# Patient Record
Sex: Female | Born: 1940
Health system: Southern US, Community
[De-identification: ages and names within clinical notes are randomized; demographics above are authoritative.]

## PROBLEM LIST (undated history)

## (undated) DIAGNOSIS — E785 Hyperlipidemia, unspecified: Secondary | ICD-10-CM

## (undated) DIAGNOSIS — I10 Essential (primary) hypertension: Secondary | ICD-10-CM

## (undated) DIAGNOSIS — M479 Spondylosis, unspecified: Secondary | ICD-10-CM

## (undated) DIAGNOSIS — E119 Type 2 diabetes mellitus without complications: Secondary | ICD-10-CM

## (undated) DIAGNOSIS — I219 Acute myocardial infarction, unspecified: Secondary | ICD-10-CM

## (undated) DIAGNOSIS — F329 Major depressive disorder, single episode, unspecified: Secondary | ICD-10-CM

## (undated) DIAGNOSIS — I2510 Atherosclerotic heart disease of native coronary artery without angina pectoris: Secondary | ICD-10-CM

## (undated) DIAGNOSIS — E669 Obesity, unspecified: Secondary | ICD-10-CM

## (undated) DIAGNOSIS — F39 Unspecified mood [affective] disorder: Secondary | ICD-10-CM

## (undated) DIAGNOSIS — S060X9A Concussion with loss of consciousness of unspecified duration, initial encounter: Secondary | ICD-10-CM

## (undated) DIAGNOSIS — F32A Depression, unspecified: Secondary | ICD-10-CM

## (undated) DIAGNOSIS — S060XAA Concussion with loss of consciousness status unknown, initial encounter: Secondary | ICD-10-CM

## (undated) HISTORY — DX: Spondylosis, unspecified: M47.9

## (undated) HISTORY — DX: Unspecified mood (affective) disorder: F39

## (undated) HISTORY — PX: TONSILLECTOMY: SUR1361

## (undated) HISTORY — DX: Hyperlipidemia, unspecified: E78.5

## (undated) HISTORY — DX: Essential (primary) hypertension: I10

## (undated) HISTORY — PX: ABDOMINAL HYSTERECTOMY: SHX81

## (undated) HISTORY — PX: CHOLECYSTECTOMY: SHX55

## (undated) HISTORY — PX: DILATION AND CURETTAGE OF UTERUS: SHX78

## (undated) HISTORY — DX: Type 2 diabetes mellitus without complications: E11.9

## (undated) HISTORY — PX: CORONARY ANGIOPLASTY: SHX604

## (undated) HISTORY — PX: OTHER SURGICAL HISTORY: SHX169

---
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
## 1966-05-18 HISTORY — PX: GALLBLADDER SURGERY: SHX652

## 1966-05-18 HISTORY — PX: COMBINED HYSTERECTOMY ABDOMINAL W/ A&P REPAIR / OOPHORECTOMY: SUR292

## 2000-01-23 ENCOUNTER — Inpatient Hospital Stay (HOSPITAL_COMMUNITY): Admission: EM | Admit: 2000-01-23 | Discharge: 2000-01-26 | Payer: Self-pay | Admitting: Emergency Medicine

## 2000-01-23 ENCOUNTER — Encounter: Payer: Self-pay | Admitting: Emergency Medicine

## 2001-07-08 ENCOUNTER — Emergency Department (HOSPITAL_COMMUNITY): Admission: EM | Admit: 2001-07-08 | Discharge: 2001-07-08 | Payer: Self-pay | Admitting: Emergency Medicine

## 2001-07-08 ENCOUNTER — Encounter: Payer: Self-pay | Admitting: Emergency Medicine

## 2007-05-04 ENCOUNTER — Emergency Department (HOSPITAL_COMMUNITY): Admission: EM | Admit: 2007-05-04 | Discharge: 2007-05-04 | Payer: Self-pay | Admitting: Emergency Medicine

## 2007-12-20 ENCOUNTER — Emergency Department (HOSPITAL_COMMUNITY): Admission: EM | Admit: 2007-12-20 | Discharge: 2007-12-20 | Payer: Self-pay | Admitting: Emergency Medicine

## 2009-05-20 ENCOUNTER — Observation Stay (HOSPITAL_COMMUNITY): Admission: EM | Admit: 2009-05-20 | Discharge: 2009-05-22 | Payer: Self-pay | Admitting: Emergency Medicine

## 2009-06-25 ENCOUNTER — Ambulatory Visit: Payer: Self-pay | Admitting: Family Medicine

## 2009-07-31 ENCOUNTER — Ambulatory Visit: Payer: Self-pay | Admitting: Family Medicine

## 2009-08-05 ENCOUNTER — Ambulatory Visit: Payer: Self-pay | Admitting: Family Medicine

## 2009-09-17 ENCOUNTER — Ambulatory Visit: Payer: Self-pay | Admitting: Family Medicine

## 2009-10-15 ENCOUNTER — Ambulatory Visit: Payer: Self-pay | Admitting: Family Medicine

## 2009-11-13 ENCOUNTER — Ambulatory Visit: Payer: Self-pay | Admitting: Family Medicine

## 2009-11-29 ENCOUNTER — Ambulatory Visit: Payer: Self-pay | Admitting: Family Medicine

## 2009-12-16 ENCOUNTER — Ambulatory Visit: Payer: Self-pay | Admitting: Family Medicine

## 2009-12-30 ENCOUNTER — Encounter: Payer: Self-pay | Admitting: Internal Medicine

## 2009-12-30 ENCOUNTER — Ambulatory Visit: Payer: Self-pay | Admitting: Internal Medicine

## 2009-12-30 DIAGNOSIS — E1159 Type 2 diabetes mellitus with other circulatory complications: Secondary | ICD-10-CM | POA: Insufficient documentation

## 2009-12-30 DIAGNOSIS — I10 Essential (primary) hypertension: Secondary | ICD-10-CM | POA: Insufficient documentation

## 2009-12-30 DIAGNOSIS — F39 Unspecified mood [affective] disorder: Secondary | ICD-10-CM | POA: Insufficient documentation

## 2009-12-30 DIAGNOSIS — E1165 Type 2 diabetes mellitus with hyperglycemia: Secondary | ICD-10-CM | POA: Insufficient documentation

## 2009-12-30 DIAGNOSIS — E785 Hyperlipidemia, unspecified: Secondary | ICD-10-CM | POA: Insufficient documentation

## 2009-12-30 DIAGNOSIS — G8929 Other chronic pain: Secondary | ICD-10-CM | POA: Insufficient documentation

## 2009-12-30 DIAGNOSIS — M549 Dorsalgia, unspecified: Secondary | ICD-10-CM

## 2009-12-31 ENCOUNTER — Emergency Department (HOSPITAL_COMMUNITY): Admission: EM | Admit: 2009-12-31 | Discharge: 2009-12-31 | Payer: Self-pay | Admitting: Emergency Medicine

## 2010-01-22 ENCOUNTER — Emergency Department (HOSPITAL_COMMUNITY): Admission: EM | Admit: 2010-01-22 | Discharge: 2010-01-22 | Payer: Self-pay | Admitting: Emergency Medicine

## 2010-02-25 ENCOUNTER — Ambulatory Visit: Payer: Self-pay | Admitting: Internal Medicine

## 2010-02-25 DIAGNOSIS — M25569 Pain in unspecified knee: Secondary | ICD-10-CM | POA: Insufficient documentation

## 2010-02-25 LAB — CONVERTED CEMR LAB
BUN: 12 mg/dL (ref 6–23)
CO2: 26 meq/L (ref 19–32)
Calcium: 9.4 mg/dL (ref 8.4–10.5)
Chloride: 102 meq/L (ref 96–112)
Creatinine, Ser: 0.5 mg/dL (ref 0.4–1.2)
GFR calc non Af Amer: 119.76 mL/min (ref >60)
Glucose, Bld: 165 mg/dL — ABNORMAL HIGH (ref 70–99)
Hgb A1c MFr Bld: 10 % — ABNORMAL HIGH (ref 4.6–6.5)
Potassium: 4.1 meq/L (ref 3.5–5.1)
Sodium: 136 meq/L (ref 135–145)

## 2010-03-12 ENCOUNTER — Encounter: Payer: Self-pay | Admitting: Internal Medicine

## 2010-05-09 ENCOUNTER — Telehealth: Payer: Self-pay | Admitting: Internal Medicine

## 2010-05-09 ENCOUNTER — Encounter: Payer: Self-pay | Admitting: Internal Medicine

## 2010-05-20 ENCOUNTER — Ambulatory Visit
Admission: RE | Admit: 2010-05-20 | Discharge: 2010-05-20 | Payer: Self-pay | Source: Home / Self Care | Attending: Internal Medicine | Admitting: Internal Medicine

## 2010-05-20 LAB — HM DIABETES FOOT EXAM

## 2010-05-20 LAB — CONVERTED CEMR LAB: Blood Glucose, Fingerstick: 191

## 2010-05-28 ENCOUNTER — Observation Stay (HOSPITAL_COMMUNITY)
Admission: EM | Admit: 2010-05-28 | Discharge: 2010-05-29 | Payer: Self-pay | Source: Home / Self Care | Attending: Internal Medicine | Admitting: Internal Medicine

## 2010-06-02 LAB — DIFFERENTIAL
Basophils Absolute: 0 10*3/uL (ref 0.0–0.1)
Basophils Relative: 0 % (ref 0–1)
Eosinophils Absolute: 0 10*3/uL (ref 0.0–0.7)
Eosinophils Relative: 0 % (ref 0–5)
Lymphocytes Relative: 46 % (ref 12–46)
Lymphs Abs: 3 10*3/uL (ref 0.7–4.0)
Monocytes Absolute: 0.6 10*3/uL (ref 0.1–1.0)
Monocytes Relative: 9 % (ref 3–12)
Neutro Abs: 2.9 10*3/uL (ref 1.7–7.7)
Neutrophils Relative %: 45 % (ref 43–77)

## 2010-06-02 LAB — TROPONIN I: Troponin I: 0.01 ng/mL (ref 0.00–0.06)

## 2010-06-02 LAB — CBC
HCT: 39.6 % (ref 36.0–46.0)
Hemoglobin: 13.7 g/dL (ref 12.0–15.0)
MCH: 31.9 pg (ref 26.0–34.0)
MCHC: 34.6 g/dL (ref 30.0–36.0)
MCV: 92.1 fL (ref 78.0–100.0)
Platelets: 213 10*3/uL (ref 150–400)
RBC: 4.3 MIL/uL (ref 3.87–5.11)
RDW: 12.4 % (ref 11.5–15.5)
WBC: 6.5 10*3/uL (ref 4.0–10.5)

## 2010-06-02 LAB — POCT I-STAT 3, VENOUS BLOOD GAS (G3P V)
Acid-Base Excess: 1 mmol/L (ref 0.0–2.0)
Bicarbonate: 24.7 mEq/L — ABNORMAL HIGH (ref 20.0–24.0)
O2 Saturation: 89 %
TCO2: 26 mmol/L (ref 0–100)
pCO2, Ven: 37.5 mmHg — ABNORMAL LOW (ref 45.0–50.0)
pH, Ven: 7.426 — ABNORMAL HIGH (ref 7.250–7.300)
pO2, Ven: 56 mmHg — ABNORMAL HIGH (ref 30.0–45.0)

## 2010-06-02 LAB — CARDIAC PANEL(CRET KIN+CKTOT+MB+TROPI)
CK, MB: 1 ng/mL (ref 0.3–4.0)
CK, MB: 1.1 ng/mL (ref 0.3–4.0)
Relative Index: INVALID (ref 0.0–2.5)
Relative Index: INVALID (ref 0.0–2.5)
Total CK: 30 U/L (ref 7–177)
Total CK: 31 U/L (ref 7–177)
Troponin I: 0.01 ng/mL (ref 0.00–0.06)
Troponin I: 0.02 ng/mL (ref 0.00–0.06)

## 2010-06-02 LAB — GLUCOSE, CAPILLARY
Glucose-Capillary: 308 mg/dL — ABNORMAL HIGH (ref 70–99)
Glucose-Capillary: 118 mg/dL — ABNORMAL HIGH (ref 70–99)
Glucose-Capillary: 173 mg/dL — ABNORMAL HIGH (ref 70–99)
Glucose-Capillary: 190 mg/dL — ABNORMAL HIGH (ref 70–99)
Glucose-Capillary: 218 mg/dL — ABNORMAL HIGH (ref 70–99)
Glucose-Capillary: 152 mg/dL — ABNORMAL HIGH (ref 70–99)

## 2010-06-02 LAB — POCT CARDIAC MARKERS
CKMB, poc: 1.1 ng/mL (ref 1.0–8.0)
CKMB, poc: <1 ng/mL — ABNORMAL LOW (ref 1.0–8.0)
Myoglobin, poc: 28.5 ng/mL (ref 12–200)
Myoglobin, poc: 19.8 ng/mL (ref 12–200)
Troponin i, poc: <0.05 ng/mL (ref 0.00–0.09)
Troponin i, poc: <0.05 ng/mL (ref 0.00–0.09)

## 2010-06-02 LAB — TSH: TSH: 3.861 u[IU]/mL (ref 0.350–4.500)

## 2010-06-02 LAB — LIPID PANEL
Cholesterol: 203 mg/dL — ABNORMAL HIGH (ref 0–200)
HDL: 32 mg/dL — ABNORMAL LOW (ref >39)
LDL Cholesterol: UNDETERMINED mg/dL (ref 0–99)
Total CHOL/HDL Ratio: 6.3 RATIO
Triglycerides: 734 mg/dL — ABNORMAL HIGH (ref <150)
VLDL: UNDETERMINED mg/dL (ref 0–40)

## 2010-06-02 LAB — URINALYSIS, ROUTINE W REFLEX MICROSCOPIC
Bilirubin Urine: NEGATIVE
Hgb urine dipstick: NEGATIVE
Ketones, ur: NEGATIVE mg/dL
Nitrite: NEGATIVE
Protein, ur: NEGATIVE mg/dL
Specific Gravity, Urine: 1.016 (ref 1.005–1.030)
Urine Glucose, Fasting: 500 mg/dL — AB
Urobilinogen, UA: 1 mg/dL (ref 0.0–1.0)
pH: 5.5 (ref 5.0–8.0)

## 2010-06-02 LAB — BASIC METABOLIC PANEL
BUN: 5 mg/dL — ABNORMAL LOW (ref 6–23)
CO2: 23 mEq/L (ref 19–32)
Calcium: 8.7 mg/dL (ref 8.4–10.5)
Chloride: 102 mEq/L (ref 96–112)
Creatinine, Ser: 0.62 mg/dL (ref 0.4–1.2)
GFR calc Af Amer: >60 mL/min (ref >60)
GFR calc non Af Amer: >60 mL/min (ref >60)
Glucose, Bld: 290 mg/dL — ABNORMAL HIGH (ref 70–99)
Potassium: 3.5 mEq/L (ref 3.5–5.1)
Sodium: 135 mEq/L (ref 135–145)

## 2010-06-02 LAB — HEMOGLOBIN A1C
Hgb A1c MFr Bld: 8.6 % — ABNORMAL HIGH (ref <5.7)
Mean Plasma Glucose: 200 mg/dL — ABNORMAL HIGH (ref <117)

## 2010-06-02 LAB — CK TOTAL AND CKMB (NOT AT ARMC)
CK, MB: 0.9 ng/mL (ref 0.3–4.0)
Relative Index: INVALID (ref 0.0–2.5)
Total CK: 23 U/L (ref 7–177)

## 2010-06-02 LAB — URINE MICROSCOPIC-ADD ON

## 2010-06-04 ENCOUNTER — Ambulatory Visit: Admit: 2010-06-04 | Payer: Self-pay | Admitting: Internal Medicine

## 2010-06-08 ENCOUNTER — Encounter: Payer: Self-pay | Admitting: Family Medicine

## 2010-06-19 NOTE — Progress Notes (Signed)
Summary: ACTOS PA  Phone Note From Pharmacy   Caller: [facility] PA # [phone] OR [phone] Summary of Call: [employer]os req PA. Proceed or change med? No alternatives given.  Initial call taken by: Lamar Sprinkles, CMA,  May 09, 2010 11:32 AM  Follow-up for Phone Call        proceed with PA Follow-up by: Jacques Navy MD,  May 09, 2010 11:49 AM  Additional Follow-up for Phone Call Additional follow up Details #1::        I have started the prior auth waiting on form to be faxed Additional Follow-up by: Ami Bullins CMA,  May 09, 2010 3:18 PM    Additional Follow-up for Phone Call Additional follow up Details #2::    Form completed & faxed..............Marland KitchenLamar Sprinkles, CMA  May 09, 2010 3:32 PM   paperwork recieved, PA approved through 05/09/2012. Pharmacy advised via fax Follow-up by: Margaret Pyle, CMA,  May 15, 2010 8:53 AM

## 2010-06-19 NOTE — Letter (Signed)
Summary: Controlled Substances Contract  [facility]  [address] [place], [place] [address]   Phone: [phone]  Fax: [phone]    [facility] Controlled Substances Contract         Patient Name: Cynthia Singh Patient DOB: 05/15/1941        Patient MRN:  [id number]        Physician's Name: _________________________________________   Patients must complete this contract before doctors at the [facility] office will be willing to prescribe controlled substances. I understand that: ___1)  I am responsible for my controlled substance medications.  If my prescription is lost, misplaced or stolen, or if I take more than prescribed, my doctor will not write me a new prescription. ___2)  I will not request or accept controlled substances or controlled substance prescriptions from any other doctor or clinic while I am receiving controlled substance treatment at [facility].  The ONLY exception is if controlled substances are prescribed for the treatment of an acute condition that is NOT the diagnosis for which I am receiving treatment at [facility].   I will call my physician at [facility] if I receive controlled substance or controlled substance prescriptions from anywhere else. ___3)  Controlled substance refills will be made ONLY during regular office hours. ___4)  Refills will not be made if I run out early.  Refills will not be made during work-in or urgent care visits.  Refills will NOT be made for "emergencies", such as on a Friday afternoon or by on call service at night or weekends.  I understand that I am required to call at least 2 business days prior to expiration date for controlled substance and/or needing controlled substance refills.   ___5)  I will not use illicit (illegal) drugs.  ___6)  I agree to take urine or blood drug tests when requested for routine screening. ___7)  I agree to use only ONE pharmacy for  filling ALL my controlled substance prescriptions.             Name and Location of Pharmacy:                                                                                                                  ___8)  I understand that my doctor may review my use of controlled substances using the [facility] Controlled Substance Reporting System. ___9)  If I behave in an abusive way towards [facility] staff, my controlled substance prescriptions may be stopped, and I may be dismissed from this practice. ___10)  I understand that if I break any of the above terms of this contract, my pain prescription and/or treatment may be stopped immediately.  If I get controlled substances from someone else or use illegal drugs, I may be reported to all my doctors, medical facilities and appropriate authorities.  I have been fully informed by [facility] physicians and the staff regarding psychological dependence (addiction) to controlled substances.  I understand that I should stop my medication ONLY under medical supervision or I may have withdrawal symptoms.  ***I have read this contract and it has been explained to me by [facility] physicians and/or their staff, and I fully understand the consequences of violating any of the terms of this contract.    Patient Signature _________________________________________ Date December 30, 2009   [facility] Staff Signature ____________________________________ Date December 30, 2009

## 2010-06-19 NOTE — Medication Information (Signed)
Summary: Prior [employer]ho for [employer]os/[employer]  Prior [employer]ho for [employer]   Imported By: Sherian Rein 05/16/2010 09:45:01  _____________________________________________________________________  External Attachment:    Type:   Image     Comment:   External Document

## 2010-06-19 NOTE — Assessment & Plan Note (Signed)
Summary: New / [employer] -- ok men/cd   Vital Signs:  Patient profile:   70 year old female Height:      68 inches Weight:      228 pounds BMI:     34.79 O2 Sat:      96 % on Room air Temp:     98.2 degrees F oral Pulse rate:   85 / minute BP sitting:   176 / 88  (left arm) Cuff size:   regular  Vitals Entered By: Bill Salinas CMA (December 30, 2009 3:54 PM)  O2 Flow:  Room air  Vision Screening:      Vision Comments: Pt's last eye exam was Aug 2010. 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 She was formerly followed by Dr. Laural Benes who is restricting his practice to diabetology.   Patient's chief complaint is back pain which has been a problem for years. This has been diagnosed DJD, no DDD. She has never had surgery on her back. She has had imaging studies of the back 17-18 years ago in [place]. She does have some pain that radiates to the medial left leg. She also has pain in the neck at the superior cervical spine.  She has been told that she had observed apneic episodes but she has not had a sleep study   She has a history of severe HA with hospitalization in January '11: consult with Dr. Sharene Skeans; CT brain - normal, MRI brain normal; MRA with narrow right vertebral artery, non-visoualization of right PICA.   She has diabetes and has been followed by Dr. Laural Benes - will need to obtain lab work.   She has left knee pain for the past 6 weeks, with swelling but no heat or erythema. Hurts with movement.   Current Medications (verified): 1)  Benicar 40 Mg Tabs (Olmesartan Medoxomil) .Marland Kitchen.. 1 Tab Once Daily 2)  Amitriptyline Hcl 150 Mg Tabs (Amitriptyline Hcl) .Marland Kitchen.. 1 Tab At Bedtime 3)  Lortab 7.5-500 Mg Tabs (Hydrocodone-Acetaminophen) .Marland Kitchen.. 1 To 2 Tabs Once Daily 4)  Actos 45 Mg Tabs (Pioglitazone Hcl) .Marland Kitchen.. 1 Tab Once Daily 5)  Vitamin D  1000 Unit Tabs (Cholecalciferol) .Marland Kitchen.. 1 Tab Once Daily 6)  Garlic Oil 500 Mg Caps (Garlic) .Marland Kitchen.. 1 Cap Once Daily  Allergies (verified): No Known Drug Allergies  Past History:  Past Medical History: UCD OSTEOARTHRITIS, BACK (ICD-715.98) HYPERLIPIDEMIA (ICD-272.4) HYPERTENSION (ICD-401.9) DIABETES-TYPE 2 (ICD-250.00) DEPRESSIVE DISORDER (ICD-311)  Past Surgical History: Gallbladder surg 1968 Appendectomy 1959 hysterectomy/oopherectomy- 1968 multiple D&Cs Tonsillectomy at 7   Family History: father - biologic father unknown mother - deceased: MVA in her late 52's Neg- breast, CAD/MI brother with colon cancer  Social History: HSG married - '62 2 sons - '63, '66; 1 daughter - '69; 11 grandchildren; 4 great-grandchildren work - Warehouse manager work, Conservation officer, 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   Review of Systems       The patient complains of dyspnea on exertion and headaches.  The patient denies anorexia, fever, weight loss, weight gain, vision loss, decreased hearing, chest pain, syncope, peripheral edema, hemoptysis, abdominal pain, severe indigestion/heartburn, incontinence, suspicious skin lesions, difficulty walking, depression, abnormal bleeding, and breast masses.         headaches infrequently  Physical Exam  General:  Heavy-set overweight white female in no distress Head:  normocephalic and atraumatic.   Eyes:  vision  grossly intact, pupils equal, and pupils round.  C&S clear  Ears:  External ear exam shows no significant lesions or deformities.  Otoscopic examination reveals clear canals, tympanic membranes are intact bilaterally without bulging, retraction, inflammation or discharge. Hearing is grossly normal bilaterally. Mouth:  missing several teeth. No buccal or palatal lesions. Posterior pharynx clear Neck:  supple, no thyromegaly, and no carotid bruits.   Chest Wall:  no scoliosis or kyphosis. Tender to palpation bilateral mid-scapular line from t1-L1 level. No CVAT or tenderness over  the spinous process Lungs:  normal respiratory effort, normal breath sounds, no crackles, and no wheezes.   Heart:  normal rate, regular rhythm, and no murmur.   Abdomen:  soft, non-tender, and normal bowel sounds.   Msk:  Large and small joints UE normal. Knees without erythema, effusion Pulses:  2+ radial Neurologic:  alert & oriented X3, cranial nerves II-XII intact, strength normal in all extremities, gait normal, and DTRs symmetrical and normal.   Skin:  turgor normal and color normal.   Cervical Nodes:  no anterior cervical adenopathy and no posterior cervical adenopathy.   Psych:  Oriented X3, memory intact for recent and remote, normally interactive, and good eye contact.     Impression & Recommendations:  Problem # 1:  OSTEOARTHRITIS, BACK (ICD-715.98) Hisotry of chronic back pain but no imaging data or consult data available. She does seem to move well.  Plan - L-S and thoracic spine films           Renewed narcotic prescription for pain control - patient reviewed and signed pain mgt contract  Her updated medication list for this problem includes:    Lortab 7.5-500 Mg Tabs (Hydrocodone-acetaminophen) .Marland Kitchen... 1 to 2 tabs once daily  Orders: T-Lumbar Spine Complete, 5 Views ([id number]) T-Thoracic Spine 2 Views ([id number])  Addendum - x-ray reveals DDD, DJD thoracic spine; demineralization and mild DDD lumbar spine.  Problem # 2:  HYPERLIPIDEMIA (ICD-272.4) By report patient with elevated lipids.   Plan - obtain recent labs from previous MD with medication recommendations to follow.  Problem # 3:  HYPERTENSION (ICD-401.9)  Her updated medication list for this problem includes:    Benicar 40 Mg Tabs (Olmesartan medoxomil) .Marland Kitchen... 1 tab once daily  BP today: 176/88  Subopitmal control at today's visit  Plan - pt to bring in BP readings from outside the office           will adjust medications at next visit based on readings.  Problem # 4:  DIABETES-TYPE 2 (ICD-250.00) No  lab available. Patient to obtain lab from previous MD with recommendations as to testing or medication adjustment to follow.  Her updated medication list for this problem includes:    Benicar 40 Mg Tabs (Olmesartan medoxomil) .Marland Kitchen... 1 tab once daily    Actos 45 Mg Tabs (Pioglitazone hcl) .Marland Kitchen... 1 tab once daily    Metformin Hcl 500 Mg Xr24h-tab (Metformin hcl) .Marland Kitchen... 1 by mouth once daily  Problem # 5:  DEPRESSIVE DISORDER (ICD-311) Seems to be stable on present medication  Her updated medication list for this problem includes:    Amitriptyline Hcl 150 Mg Tabs (Amitriptyline hcl) .Marland Kitchen... 1 tab at bedtime  Problem # 6:  Preventive Health Care (ICD-V70.0) She is to return with outside labs for complete physical, lab review and health maintenance.  Complete Medication List: 1)  Benicar 40 Mg Tabs (Olmesartan medoxomil) .Marland Kitchen.. 1 tab once daily 2)  Amitriptyline Hcl 150 Mg Tabs (Amitriptyline hcl) .Marland Kitchen.. 1 tab  at bedtime 3)  Lortab 7.5-500 Mg Tabs (Hydrocodone-acetaminophen) .Marland Kitchen.. 1 to 2 tabs once daily 4)  Actos 45 Mg Tabs (Pioglitazone hcl) .Marland Kitchen.. 1 tab once daily 5)  Vitamin D 1000 Unit Tabs (Cholecalciferol) .Marland Kitchen.. 1 tab once daily 6)  Garlic Oil 500 Mg Caps (Garlic) .Marland Kitchen.. 1 cap once daily 7)  Metformin Hcl 500 Mg Xr24h-tab (Metformin hcl) .Marland Kitchen.. 1 by mouth once daily  G THORACIC SPINE - [id number]   Clinical Data: Osteoarthritis, back pain   THORACIC SPINE - 2 VIEW   Comparison: None   Findings: 12 pairs of ribs. Bony demineralization. Multilevel endplate spur formation thoracic spine. Broad-based dextroconvex thoracic scoliosis, apex T7. Mildly tortuous thoracic aorta. Vertebral body heights maintained without fracture or subluxation.   IMPRESSION: Multilevel degenerative disc disease changes thoracic spine with dextroconvex scoliosis. Bony demineralization.   Read By:  Lollie Marrow,  M.D.  [facility] Overall SPINE COMPLETE - [id number]   Clinical Data: Osteoarthritis, back pain   LUMBAR SPINE  - COMPLETE 4+ VIEW   Comparison: None   Findings: Five non-rib bearing lumbar type vertebrae. Diffuse bony demineralization. Facet degenerative changes lower lumbar spine. Scattered disc space narrowing lumbar spine. Vertebral body heights maintained without fracture or subluxation. No spondylolysis. Minimal atherosclerotic calcification aorta.   IMPRESSION: Bony demineralization. Mild degenerative facet and disc disease changes lumbar spine as above.   Read By:  Lollie Marrow,  Judie Petit.D.Prescriptions: LORTAB 7.5-500 MG TABS (HYDROCODONE-ACETAMINOPHEN) 1 to 2 tabs once daily  #60 x 3   Entered and Authorized by:   Jacques Navy MD   Signed by:   Jacques Navy MD on 12/30/2009   Method used:   Print then Give to Patient   RxID:   [id number] ACTOS 45 MG TABS (PIOGLITAZONE HCL) 1 tab once daily  #30 x 12   Entered and Authorized by:   Jacques Navy MD   Signed by:   Jacques Navy MD on 12/30/2009   Method used:   Electronically to        [facility]* (retail)       [address]       [place], [place]  [address]       Ph: [phone]       Fax: [phone]   RxID:   [id number]   Prevention & Chronic Care Immunizations   Influenza vaccine: Historical  (03/11/2009)    Tetanus booster: 07/16/2008: Historical    Pneumococcal vaccine: Not documented    H. zoster vaccine: Not documented  Colorectal Screening   Hemoccult: Not documented    Colonoscopy: Not documented  Other Screening   Pap smear: Not documented    Mammogram: Not documented    DXA bone density scan: Not documented   Smoking status: Not documented  Diabetes Mellitus   HgbA1C: Not documented    Eye exam: Not documented    Foot exam: Not documented   High risk foot: Not documented   Foot care education: Not documented    Urine microalbumin/creatinine ratio: Not documented  Lipids   Total Cholesterol: Not documented   LDL: Not documented   LDL Direct: Not documented    HDL: Not documented   Triglycerides: Not documented    SGOT (AST): Not documented   SGPT (ALT): Not documented   Alkaline phosphatase: Not documented   Total bilirubin: Not documented  Hypertension   Last Blood Pressure: 176 / 88  (12/30/2009)   Serum creatinine: Not documented   Serum potassium Not documented  Self-Management Support :    Diabetes self-management support: Not documented    Hypertension self-management support: Not documented    Lipid self-management support: Not documented     Immunization History:  Tetanus/Td Immunization History:    Tetanus/Td:  historical (07/16/2008)  Influenza Immunization History:    Influenza:  historical (03/11/2009)

## 2010-06-19 NOTE — Miscellaneous (Signed)
Summary: Controlled Substances Contract/[facility]  Controlled Substances Contract/[facility]   Imported By: Sherian Rein 01/01/2010 11:03:03  _____________________________________________________________________  External Attachment:    Type:   Image     Comment:   External Document

## 2010-06-19 NOTE — Letter (Signed)
Summary: Controlled Substances Contract  [facility]  [address]   [place], [place] [address]   Phone: [phone]  Fax: [phone]    [facility] Controlled Substances Contract         Patient Name: Cynthia Singh Patient DOB: 10/26/1940        Patient MRN:  [id number]        Physician's Name: _________________________________________   Patients must complete this contract before doctors at the [facility] office will be willing to prescribe controlled substances. I understand that: ___1)  I am responsible for my controlled substance medications.  If my prescription is lost, misplaced or stolen, or if I take more than prescribed, my doctor will not write me a new prescription. ___2)  I will not request or accept controlled substances or controlled substance prescriptions from any other doctor or clinic while I am receiving controlled substance treatment at [facility].  The ONLY exception is if controlled substances are prescribed for the treatment of an acute condition that is NOT the diagnosis for which I am receiving treatment at [facility].   I will call my physician at [facility] if I receive controlled substance or controlled substance prescriptions from anywhere else. ___3)  Controlled substance refills will be made ONLY during regular office hours. ___4)  Refills will not be made if I run out early.  Refills will not be made during work-in or urgent care visits.  Refills will NOT be made for "emergencies", such as on a Friday afternoon or by on call service at night or weekends.  I understand that I am required to call at least 2 business days prior to expiration date for controlled substance and/or needing controlled substance refills.   ___5)  I will not use illicit (illegal) drugs.  ___6)  I agree to take urine or blood drug tests when requested for routine screening. ___7)  I agree to use only ONE pharmacy for  filling ALL my controlled substance prescriptions.             Name and Location of Pharmacy:                                                                                                                  ___8)  I understand that my doctor may review my use of controlled substances using the [place] Controlled Substance Reporting System. ___9)  If I behave in an abusive way towards [facility] staff, my controlled substance prescriptions may be stopped, and I may be dismissed from this practice. ___10)  I understand that if I break any of the above terms of this contract, my pain prescription and/or treatment may be stopped immediately.  If I get controlled substances from someone else or use illegal drugs, I may be reported to all my doctors, medical facilities and appropriate authorities.  I have been fully informed by [facility] physicians and the staff regarding psychological dependence (addiction) to controlled substances.    I understand that I should stop my medication ONLY under medical supervision or I may have withdrawal symptoms.  ***I have read this contract and it has been explained to me by [facility] physicians and/or their staff, and I fully understand the consequences of violating any of the terms of this contract.    Patient Signature _________________________________________ Date December 30, 2009    Staff Signature ____________________________________ Date December 30, 2009 

## 2010-06-19 NOTE — Assessment & Plan Note (Signed)
Summary: diabetic f/u per flag/cd   Vital Signs:  Patient profile:   70 year old female Height:      68 inches Weight:      225 pounds BMI:     34.33 O2 Sat:      97 % on Room air Temp:     98.0 degrees F oral Pulse rate:   83 / minute BP sitting:   162 / 92  (left arm) Cuff size:   regular  Vitals Entered By: Ami Bullins CMA (May 20, 2010 2:52 PM)  O2 Flow:  Room air CC: follow-up visit/ ab CBG Result 191   Primary Care Provider:  Jacques Navy MD  CC:  follow-up visit/ ab.  History of Present Illness: Patient presents for follow-up of diabetes. She had an elevated A1C in October '11 and was notified by of this and had actos added to her regimen of metformin XR 500mg  once a day. She reports that CBGs continue to run high - 150+.She has had some blurry vision but no other complaints.  Current Medications (verified): 1)  Benicar 40 Mg Tabs (Olmesartan Medoxomil) .Marland Kitchen.. 1 Tab Once Daily 2)  Amitriptyline Hcl 150 Mg Tabs (Amitriptyline Hcl) .Marland Kitchen.. 1 Tab At Bedtime 3)  Lortab 7.5-500 Mg Tabs (Hydrocodone-Acetaminophen) .Marland Kitchen.. 1 To 2 Tabs Once Daily 4)  Actos 45 Mg Tabs (Pioglitazone Hcl) .Marland Kitchen.. 1 Tab Once Daily 5)  Vitamin D 1000 Unit Tabs (Cholecalciferol) .Marland Kitchen.. 1 Tab Once Daily 6)  Garlic Oil 500 Mg Caps (Garlic) .Marland Kitchen.. 1 Cap Once Daily 7)  Metformin Hcl 500 Mg Xr24h-Tab (Metformin Hcl) .Marland Kitchen.. 1 By Mouth Once Daily  Allergies (verified): No Known Drug Allergies  Past History:  Past Medical History: Last updated: 12/30/2009 UCD OSTEOARTHRITIS, BACK (ICD-715.98) HYPERLIPIDEMIA (ICD-272.4) HYPERTENSION (ICD-401.9) DIABETES-TYPE 2 (ICD-250.00) DEPRESSIVE DISORDER (ICD-311)  Past Surgical History: Last updated: 12/30/2009 Gallbladder surg 1968 Appendectomy 1959 hysterectomy/oopherectomy- 1968 multiple D&Cs Tonsillectomy at 7  FH reviewed for relevance, SH/Risk Factors reviewed for relevance  Review of Systems       The patient complains of peripheral edema.  The  patient denies anorexia, fever, weight loss, weight gain, vision loss, hoarseness, chest pain, dyspnea on exertion, abdominal pain, severe indigestion/heartburn, muscle weakness, difficulty walking, and depression.         mild peripheral edema  Physical Exam  General:  Overweight white woman in no distress Head:  Normocephalic and atraumatic without obvious abnormalities. No apparent alopecia or balding. Eyes:  vision grossly intact, pupils equal, and pupils round.   Neck:  supple.   Lungs:  normal respiratory effort and normal breath sounds.   Heart:  normal rate and regular rhythm.   Abdomen:  obese Msk:  no joint tenderness, no joint swelling, no joint warmth, and no redness over joints.   Pulses:  2+ radial Neurologic:  alert & oriented X3 and cranial nerves II-XII intact.  Decrease DTR right patellar tendon compared to left. Skin:  turgor normal and color normal.   Psych:  Oriented X3, normally interactive, and good eye contact.    Diabetes Management Exam:    Foot Exam (with socks and/or shoes not present):       Sensory-Pinprick/Light touch:          Left medial foot (L-4): normal          Left dorsal foot (L-5): normal          Left lateral foot (S-1): normal  Right medial foot (L-4): diminished          Right dorsal foot (L-5): normal          Right lateral foot (S-1): diminished       Sensory-other: decreased deep vibratory sensation at right foot; preserved sensation to deep vibration left   Impression & Recommendations:  Problem # 1:  DIABETES-TYPE 2 (ICD-250.00) Suboptimal control  Plan - continue actos 45mg            increase metformin to 1000mg  two times a day            A1C in 3 months.            To schedule appointment with eye doctor  Her updated medication list for this problem includes:    Benicar 40 Mg Tabs (Olmesartan medoxomil) .Marland Kitchen... 1 tab once daily    Actos 45 Mg Tabs (Pioglitazone hcl) .Marland Kitchen... 1 tab once daily    Metformin Hcl 1000 Mg Tabs  (Metformin hcl) .Marland Kitchen... 1 by mouth two times a day  Problem # 2:  PARESTHESIA (ICD-782.0) Patient with decreased sensation and reflex Right LE and foot. The pattern of deficit suggests focal lesion spine as opposed to diabetic neuropathy. She has had back trouble and a remote injury.  Plan - patient made aware of this deficit and instructed to be vigilent - checking that foot freuently for any injury.   Complete Medication List: 1)  Benicar 40 Mg Tabs (Olmesartan medoxomil) .Marland Kitchen.. 1 tab once daily 2)  Amitriptyline Hcl 150 Mg Tabs (Amitriptyline hcl) .Marland Kitchen.. 1 tab at bedtime 3)  Lortab 7.5-500 Mg Tabs (Hydrocodone-acetaminophen) .Marland Kitchen.. 1 to 2 tabs once daily 4)  Actos 45 Mg Tabs (Pioglitazone hcl) .Marland Kitchen.. 1 tab once daily 5)  Vitamin D 1000 Unit Tabs (Cholecalciferol) .Marland Kitchen.. 1 tab once daily 6)  Garlic Oil 500 Mg Caps (Garlic) .Marland Kitchen.. 1 cap once daily 7)  Metformin Hcl 1000 Mg Tabs (Metformin hcl) .Marland Kitchen.. 1 by mouth two times a day Prescriptions: LORTAB 7.5-500 MG TABS (HYDROCODONE-ACETAMINOPHEN) 1 to 2 tabs once daily  #60 x 3   Entered and Authorized by:   Jacques Navy MD   Signed by:   Jacques Navy MD on 05/21/2010   Method used:   Handwritten   RxID:   [id number] METFORMIN HCL 1000 MG TABS (METFORMIN HCL) 1 by mouth two times a day  #60 x 12   Entered and Authorized by:   Jacques Navy MD   Signed by:   Jacques Navy MD on 05/20/2010   Method used:   Electronically to        [facility]* (retail)       [address]       [place], [place]  [address]       Ph: [phone]       Fax: [phone]   RxID:   [id number] AMITRIPTYLINE HCL 150 MG TABS (AMITRIPTYLINE HCL) 1 tab at bedtime  #90 x 3   Entered and Authorized by:   Jacques Navy MD   Signed by:   Jacques Navy MD on 05/20/2010   Method used:   Electronically to        [facility]* (retail)       [address]       [place], [place]  [address]       Ph: [phone]       Fax: [phone]   RxID:    [id number]    Orders  Added: 1)  Est. Patient Level III [04540]

## 2010-06-19 NOTE — Assessment & Plan Note (Signed)
Summary: POST HOSP/NWS  #   Vital Signs:  Patient profile:   70 year old female Height:      68 inches (172.72 cm) Weight:      215 pounds (97.73 kg) BMI:     32.81 O2 Sat:      93 % on Room air Temp:     98.3 degrees F (36.83 degrees C) oral Pulse rate:   76 / minute BP sitting:   124 / 80  (left arm) Cuff size:   regular  Vitals Entered By: Brenton Grills MA (February 25, 2010 11:43 AM)  O2 Flow:  Room air CC: [facility] F/U/aj Is Patient Diabetic? Yes   Primary Care Provider:  Jacques Navy MD  CC:  [facility] F/U/aj.  History of Present Illness: Paitnet presents for follow-up of left knee pain. she was seen in the ED Sept 7th - all notes and xrays reviewed: Had degereative change only. Was treated with percocet. she says the pain is getting worse. She gets intermittent swelling. the pain is radiating to the lower leg. She takes lortab. She reports that NSAIDs do not help. She has difficulty with standing and walking due to leg pain.   she reports that her blood sugar is running high. She is on a low sugar low carb diet.   Current Medications (verified): 1)  Benicar 40 Mg Tabs (Olmesartan Medoxomil) .Marland Kitchen.. 1 Tab Once Daily 2)  Amitriptyline Hcl 150 Mg Tabs (Amitriptyline Hcl) .Marland Kitchen.. 1 Tab At Bedtime 3)  Lortab 7.5-500 Mg Tabs (Hydrocodone-Acetaminophen) .Marland Kitchen.. 1 To 2 Tabs Once Daily 4)  Actos 45 Mg Tabs (Pioglitazone Hcl) .Marland Kitchen.. 1 Tab Once Daily 5)  Vitamin D 1000 Unit Tabs (Cholecalciferol) .Marland Kitchen.. 1 Tab Once Daily 6)  Garlic Oil 500 Mg Caps (Garlic) .Marland Kitchen.. 1 Cap Once Daily 7)  Metformin Hcl 500 Mg Xr24h-Tab (Metformin Hcl) .Marland Kitchen.. 1 By Mouth Once Daily  Allergies (verified): No Known Drug Allergies  Past History:  Past Medical History: Last updated: 12/30/2009 UCD OSTEOARTHRITIS, BACK (ICD-715.98) HYPERLIPIDEMIA (ICD-272.4) HYPERTENSION (ICD-401.9) DIABETES-TYPE 2 (ICD-250.00) DEPRESSIVE DISORDER (ICD-311)  Past Surgical History: Last updated:  12/30/2009 Gallbladder surg 1968 Appendectomy 1959 hysterectomy/oopherectomy- 1968 multiple D&Cs Tonsillectomy at 7   Family History: Last updated: 12/30/2009 father - biologic father unknown mother - deceased: MVA in her late 83's Neg- breast, CAD/MI brother with colon cancer  Social History: Last updated: 12/30/2009 HSG married - '62 2 sons - '63, '66; 1 daughter - '69; 11 grandchildren; 4 great-grandchildren work - Warehouse manager work, Conservation officer, 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   Review of Systems  The patient denies anorexia, fever, weight loss, weight gain, decreased hearing, chest pain, syncope, dyspnea on exertion, prolonged cough, headaches, abdominal pain, severe indigestion/heartburn, incontinence, suspicious skin lesions, difficulty walking, and angioedema.    Physical Exam  General:  alert, well-developed, well-nourished, and normal appearance.   Head:  normocephalic and atraumatic.   Eyes:  C&S clear Neck:  supple and full ROM.   Lungs:  normal respiratory effort, normal breath sounds, and no wheezes.   Heart:  normal rate, regular rhythm, and no gallop.   Abdomen:  soft, non-tender, normal bowel sounds, and no rigidity.   Msk:  crepitis with movement of the right knee. Left knee wihtout effusion, no click, full range of motion. Tender to palpation at the medial aspect of the knee.  Neurologic:  alert & oriented X3 and cranial nerves II-XII intact.     Impression & Recommendations:  Problem # 1:  DIABETES-TYPE 2 (ICD-250.00)  Due ofr A1C with recommendations to follow  Her updated medication list for this problem includes:    Benicar 40 Mg Tabs (Olmesartan medoxomil) .Marland Kitchen... 1 tab once daily    Actos 45 Mg Tabs (Pioglitazone hcl) .Marland Kitchen... 1 tab once daily    Metformin Hcl 500 Mg Xr24h-tab (Metformin hcl) .Marland Kitchen... 1 by mouth once daily  Orders: TLB-BMP (Basic Metabolic Panel-BMET) (80048-METABOL) TLB-A1C / Hgb A1C (Glycohemoglobin) (83036-A1C)  Problem # 2:  KNEE PAIN, LEFT, CHRONIC  (ICD-719.46)  Patient with progressive pain in the left knee. [facility] with tricompartmental DJD.  Plan - refer to Dr. Kristeen Miss.  Her updated medication list for this problem includes:    Lortab 7.5-500 Mg Tabs (Hydrocodone-acetaminophen) .Marland Kitchen... 1 to 2 tabs once daily  Orders: Orthopedic Surgeon Referral (Ortho Surgeon)  Problem # 3:  HYPERTENSION (ICD-401.9)  Her updated medication list for this problem includes:    Benicar 40 Mg Tabs (Olmesartan medoxomil) .Marland Kitchen... 1 tab once daily  BP today: 124/80 Prior BP: 176/88 (12/30/2009)  Good control on present medications  Complete Medication List: 1)  Benicar 40 Mg Tabs (Olmesartan medoxomil) .Marland Kitchen.. 1 tab once daily 2)  Amitriptyline Hcl 150 Mg Tabs (Amitriptyline hcl) .Marland Kitchen.. 1 tab at bedtime 3)  Lortab 7.5-500 Mg Tabs (Hydrocodone-acetaminophen) .Marland Kitchen.. 1 to 2 tabs once daily 4)  Actos 45 Mg Tabs (Pioglitazone hcl) .Marland Kitchen.. 1 tab once daily 5)  Vitamin D 1000 Unit Tabs (Cholecalciferol) .Marland Kitchen.. 1 tab once daily 6)  Garlic Oil 500 Mg Caps (Garlic) .Marland Kitchen.. 1 cap once daily 7)  Metformin Hcl 500 Mg Xr24h-tab (Metformin hcl) .Marland Kitchen.. 1 by mouth once daily Prescriptions: LORTAB 7.5-500 MG TABS (HYDROCODONE-ACETAMINOPHEN) 1 to 2 tabs once daily  #60 x 3   Entered and Authorized by:   Jacques Navy MD   Signed by:   Jacques Navy MD on 02/25/2010   Method used:   Print then Give to Patient   RxID:   [id number]

## 2010-06-19 NOTE — Medication Information (Signed)
Summary: Actos approved/[employer]  Actos approved/[employer]   Imported By: Sherian Rein 05/21/2010 09:49:33  _____________________________________________________________________  External Attachment:    Type:   Image     Comment:   External Document

## 2010-06-19 NOTE — Letter (Signed)
   [facility] [address] [place], [place]  [address] Phone: [phone]      March 13, 2010   Margie Brashier [address] [place], [place] [address]  RE:  LAB RESULTS  Dear  Ms. Isais,  The following is an interpretation of your most recent lab tests.  Please take note of any instructions provided or changes to medications that have resulted from your lab work.  ELECTROLYTES:  Good - no changes needed  KIDNEY FUNCTION TESTS:  Good - no changes needed  DIABETIC STUDIES:  Poor - schedule a follow-up appointment soon Blood Glucose: 165   HgbA1C: 10.0     I apologize for the delay. Your A1C indicates very poor control of diabetes. Please schedule an appointment.   Sincerely Yours,    Jacques Navy MD

## 2010-07-10 ENCOUNTER — Other Ambulatory Visit: Payer: Self-pay | Admitting: Internal Medicine

## 2010-07-10 ENCOUNTER — Ambulatory Visit (INDEPENDENT_AMBULATORY_CARE_PROVIDER_SITE_OTHER)
Admission: RE | Admit: 2010-07-10 | Discharge: 2010-07-10 | Disposition: A | Discharge status: Home / Self Care | Payer: Medicare HMO | Source: Ambulatory Visit | Attending: Internal Medicine | Admitting: Internal Medicine

## 2010-07-10 ENCOUNTER — Ambulatory Visit (INDEPENDENT_AMBULATORY_CARE_PROVIDER_SITE_OTHER): Payer: Medicare HMO | Admitting: Internal Medicine

## 2010-07-10 ENCOUNTER — Encounter: Payer: Self-pay | Admitting: Internal Medicine

## 2010-07-10 DIAGNOSIS — M542 Cervicalgia: Secondary | ICD-10-CM

## 2010-07-10 DIAGNOSIS — I10 Essential (primary) hypertension: Secondary | ICD-10-CM

## 2010-07-10 DIAGNOSIS — E119 Type 2 diabetes mellitus without complications: Secondary | ICD-10-CM

## 2010-07-13 ENCOUNTER — Telehealth: Payer: Self-pay | Admitting: Internal Medicine

## 2010-07-24 NOTE — Progress Notes (Signed)
  Phone Note Outgoing Call   Reason for Call: Discuss lab or test results Summary of Call: Plese call patient - due to high blood pressure will call in furosemide 20mg  by mouth once daily. Return for BP check in 3 weeks. Return for A1C in 3 months.   Thanks Initial call taken by: Jacques Navy MD,  July 13, 2010 9:12 PM  Follow-up for Phone Call        pt sent to [facility] to set up nurse visit in 3 weeks for BP check Follow-up by: Ami Bullins CMA,  July 14, 2010 11:26 AM    New/Updated Medications: FUROSEMIDE 20 MG TABS (FUROSEMIDE) 1 by mouth qAM for blood pressure Prescriptions: FUROSEMIDE 20 MG TABS (FUROSEMIDE) 1 by mouth qAM for blood pressure  #30 x 2   Entered by:   Ami Bullins CMA   Authorized by:   Jacques Navy MD   Signed by:   Bill Salinas CMA on 07/14/2010   Method used:   Electronically to        [facility] [id number]* (retail)       [address] [place], [place]  [address]       Ph: [phone]       Fax: [phone]   RxID:   [id number]

## 2010-07-24 NOTE — Assessment & Plan Note (Signed)
Summary: post hospital per men/ cg   Vital Signs:  Patient profile:   70 year old female Height:      68 inches (172.72 cm) Weight:      221.75 pounds (100.80 kg) BMI:     33.84 O2 Sat:      96 % on Room air Temp:     97.9 degrees F (36.61 degrees C) oral Pulse rate:   86 / minute Resp:     16 per minute BP sitting:   142 / 82  (left arm) Cuff size:   regular  Vitals Entered By: Burnard Leigh CMA(AAMA) (July 10, 2010 1:28 PM)  O2 Flow:  Room air CC: Post hospital F/U.Pt c/o of Left neck and shoulder pain w/movement/sls,cma Is Patient Diabetic? Yes   Primary Care Provider:  Jacques Navy MD  CC:  Post hospital F/U.Pt c/o of Left neck and shoulder pain w/movement/sls and cma.  History of Present Illness: Having a pin in the neck: she can turn her head and she will have stiffness and pain and in her arm and she will turn red. She describes this as a catch which will eventually slip back into place. She has a h/o neck problems in the past.   She reports that she has been following a diabetic diet to the best of her ability and she has been taking her medication.   Current Medications (verified): 1)  Benicar 40 Mg Tabs (Olmesartan Medoxomil) .Marland Kitchen.. 1 Tab Once Daily 2)  Amitriptyline Hcl 150 Mg Tabs (Amitriptyline Hcl) .Marland Kitchen.. 1 Tab At Bedtime 3)  Lortab 7.5-500 Mg Tabs (Hydrocodone-Acetaminophen) .Marland Kitchen.. 1 To 2 Tabs Once Daily 4)  Actos 45 Mg Tabs (Pioglitazone Hcl) .Marland Kitchen.. 1 Tab Once Daily 5)  Vitamin D 1000 Unit Tabs (Cholecalciferol) .Marland Kitchen.. 1 Tab Once Daily 6)  Garlic Oil 500 Mg Caps (Garlic) .Marland Kitchen.. 1 Cap Once Daily 7)  Metformin Hcl 1000 Mg Tabs (Metformin Hcl) .Marland Kitchen.. 1 By Mouth Two Times A Day  Allergies (verified): No Known Drug Allergies  Past History:  Past Medical History: Last updated: 12/30/2009 UCD OSTEOARTHRITIS, BACK (ICD-715.98) HYPERLIPIDEMIA (ICD-272.4) HYPERTENSION (ICD-401.9) DIABETES-TYPE 2 (ICD-250.00) DEPRESSIVE DISORDER (ICD-311)  Past Surgical  History: Last updated: 12/30/2009 Gallbladder surg 1968 Appendectomy 1959 hysterectomy/oopherectomy- 1968 multiple D&Cs Tonsillectomy at 7  FH reviewed for relevance, SH/Risk Factors reviewed for relevance  Physical Exam  General:  Well-developed,well-nourished,in no acute distress; alert,appropriate and cooperative throughout examination Head:  normocephalic and atraumatic.   Eyes:  vision grossly intact, pupils equal, and pupils round.   Neck:  decreased range of motion with limitation in rotation. Lungs:  normal respiratory effort and normal breath sounds.   Heart:  normal rate and regular rhythm.   Neurologic:  alert & oriented X3 and cranial nerves II-XII intact.  Normal grip and UE strength, normal DTRs biceps and radial tendons, sensation normal Skin:  turgor normal and color normal.   Cervical Nodes:  no anterior cervical adenopathy and no posterior cervical adenopathy.   Psych:  Oriented X3, normally interactive, good eye contact, and not anxious appearing.     Impression & Recommendations:  Problem # 1:  NECK PAIN (ICD-723.1) Most likely a problem of DJD but will need to r/0 spondolysthesis or DDD  Plan - flex/ext C-spine with recommendations to follow.  Her updated medication list for this problem includes:    Lortab 7.5-500 Mg Tabs (Hydrocodone-acetaminophen) .Marland Kitchen... 1 to 2 tabs once daily  Orders: T-Cervical Spine Comp w/Flex & Ext ([address]UE)  Clinical Data: 71 year old female with neck pain radiating down the upper extremities.   CERVICAL SPINE COMPLETE WITH FLEXION AND EXTENSION VIEWS   Comparison: Brain MRI 05/20/2009.   Findings: Straightening of cervical lordosis.  Bulky anterior endplate osteophytes from C3 inferiorly.  Normal prevertebral soft tissues.  Preserved cervical disc spaces.  Decreased range of motion in flexion and extension. Bilateral posterior element alignment is within normal limits.  AP alignment and lung apices within normal limits.   C1-C2 alignment and odontoid within normal limits. Cervicothoracic junction alignment is within normal limits.   IMPRESSION: Diffuse idiopathic skeletal hyperostosis and decreased cervical spine range of motion.   Original Report Authenticated By: Harley Hallmark, M.D.  Problem # 2:  HYPERTENSION (ICD-401.9)  Her updated medication list for this problem includes:    Benicar 40 Mg Tabs (Olmesartan medoxomil) .Marland Kitchen... 1 tab once daily  BP today: 142/82 Prior BP: 162/92 (05/20/2010)  Labs Reviewed: K+: 4.1 (02/25/2010) Creat: : 0.5 (02/25/2010)   Suboptimal control for a diabetic patient - will add a diuretic, furosdemide 20mg  once daily for better control of SBP    Problem # 3:  DIABETES-TYPE 2 (ICD-250.00)  Her updated medication list for this problem includes:    Benicar 40 Mg Tabs (Olmesartan medoxomil) .Marland Kitchen... 1 tab once daily    Actos 45 Mg Tabs (Pioglitazone hcl) .Marland Kitchen... 1 tab once daily    Metformin Hcl 1000 Mg Tabs (Metformin hcl) .Marland Kitchen... 1 by mouth two times a day    Januvia 100 Mg Tabs (Sitagliptin phosphate) .Marland Kitchen... 1 by mouth once daily for diabetes  Labs Reviewed: Creat: 0.5 (02/25/2010)    Reviewed HgBA1c results: 10.0 (02/25/2010)  Plan - add [place] 100mg  once daily to her present regimen           A1C in 3 months.  Complete Medication List: 1)  Benicar 40 Mg Tabs (Olmesartan medoxomil) .Marland Kitchen.. 1 tab once daily 2)  Amitriptyline Hcl 150 Mg Tabs (Amitriptyline hcl) .Marland Kitchen.. 1 tab at bedtime 3)  Lortab 7.5-500 Mg Tabs (Hydrocodone-acetaminophen) .Marland Kitchen.. 1 to 2 tabs once daily 4)  Actos 45 Mg Tabs (Pioglitazone hcl) .Marland Kitchen.. 1 tab once daily 5)  Vitamin D 1000 Unit Tabs (Cholecalciferol) .Marland Kitchen.. 1 tab once daily 6)  Garlic Oil 500 Mg Caps (Garlic) .Marland Kitchen.. 1 cap once daily 7)  Metformin Hcl 1000 Mg Tabs (Metformin hcl) .Marland Kitchen.. 1 by mouth two times a day 8)  Januvia 100 Mg Tabs (Sitagliptin phosphate) .Marland Kitchen.. 1 by mouth once daily for diabetes   Orders Added: 1)  T-Cervical Spine Comp w/Flex &  Ext [72052TC] 2)  Est. Patient Level III [04540]   Immunization History:  Pneumovax Immunization History:    Pneumovax:  historical (05/18/2009)   Immunization History:  Pneumovax Immunization History:    Pneumovax:  Historical (05/18/2009)

## 2010-07-31 LAB — GLUCOSE, CAPILLARY: Glucose-Capillary: 299 mg/dL — ABNORMAL HIGH (ref 70–99)

## 2010-08-03 LAB — CARDIAC PANEL(CRET KIN+CKTOT+MB+TROPI)
CK, MB: 1.1 ng/mL (ref 0.3–4.0)
CK, MB: 1.2 ng/mL (ref 0.3–4.0)
Relative Index: INVALID (ref 0.0–2.5)
Relative Index: INVALID (ref 0.0–2.5)
Total CK: 41 U/L (ref 7–177)
Total CK: 55 U/L (ref 7–177)
Troponin I: 0.02 ng/mL (ref 0.00–0.06)
Troponin I: <0.01 ng/mL (ref 0.00–0.06)

## 2010-08-03 LAB — DIFFERENTIAL
Basophils Absolute: 0.1 10*3/uL (ref 0.0–0.1)
Basophils Relative: 1 % (ref 0–1)
Eosinophils Absolute: 0.1 10*3/uL (ref 0.0–0.7)
Eosinophils Relative: 1 % (ref 0–5)
Lymphocytes Relative: 35 % (ref 12–46)
Lymphs Abs: 3 10*3/uL (ref 0.7–4.0)
Monocytes Absolute: 0.7 10*3/uL (ref 0.1–1.0)
Monocytes Relative: 8 % (ref 3–12)
Neutro Abs: 4.7 10*3/uL (ref 1.7–7.7)
Neutrophils Relative %: 55 % (ref 43–77)

## 2010-08-03 LAB — GLUCOSE, CAPILLARY
Glucose-Capillary: 112 mg/dL — ABNORMAL HIGH (ref 70–99)
Glucose-Capillary: 123 mg/dL — ABNORMAL HIGH (ref 70–99)
Glucose-Capillary: 123 mg/dL — ABNORMAL HIGH (ref 70–99)
Glucose-Capillary: 136 mg/dL — ABNORMAL HIGH (ref 70–99)
Glucose-Capillary: 138 mg/dL — ABNORMAL HIGH (ref 70–99)
Glucose-Capillary: 146 mg/dL — ABNORMAL HIGH (ref 70–99)
Glucose-Capillary: 162 mg/dL — ABNORMAL HIGH (ref 70–99)
Glucose-Capillary: 164 mg/dL — ABNORMAL HIGH (ref 70–99)
Glucose-Capillary: 180 mg/dL — ABNORMAL HIGH (ref 70–99)
Glucose-Capillary: 188 mg/dL — ABNORMAL HIGH (ref 70–99)
Glucose-Capillary: 99 mg/dL (ref 70–99)

## 2010-08-03 LAB — POCT I-STAT, CHEM 8
BUN: 12 mg/dL (ref 6–23)
Calcium, Ion: 1.19 mmol/L (ref 1.12–1.32)
Chloride: 107 mEq/L (ref 96–112)
Creatinine, Ser: 0.6 mg/dL (ref 0.4–1.2)
Glucose, Bld: 127 mg/dL — ABNORMAL HIGH (ref 70–99)
HCT: 42 % (ref 36.0–46.0)
Hemoglobin: 14.3 g/dL (ref 12.0–15.0)
Potassium: 3.7 mEq/L (ref 3.5–5.1)
Sodium: 140 mEq/L (ref 135–145)
TCO2: 27 mmol/L (ref 0–100)

## 2010-08-03 LAB — LIPID PANEL
Cholesterol: 192 mg/dL (ref 0–200)
HDL: 36 mg/dL — ABNORMAL LOW (ref >39)
LDL Cholesterol: 103 mg/dL — ABNORMAL HIGH (ref 0–99)
Total CHOL/HDL Ratio: 5.3 RATIO
Triglycerides: 266 mg/dL — ABNORMAL HIGH (ref <150)
VLDL: 53 mg/dL — ABNORMAL HIGH (ref 0–40)

## 2010-08-03 LAB — CBC
HCT: 39 % (ref 36.0–46.0)
Hemoglobin: 13.4 g/dL (ref 12.0–15.0)
MCHC: 34.4 g/dL (ref 30.0–36.0)
MCV: 88.6 fL (ref 78.0–100.0)
Platelets: 230 10*3/uL (ref 150–400)
RBC: 4.4 MIL/uL (ref 3.87–5.11)
RDW: 13.9 % (ref 11.5–15.5)
WBC: 8.4 10*3/uL (ref 4.0–10.5)

## 2010-08-03 LAB — SEDIMENTATION RATE: Sed Rate: 13 mm/hr (ref 0–22)

## 2010-08-03 LAB — CK TOTAL AND CKMB (NOT AT ARMC)
CK, MB: 1.1 ng/mL (ref 0.3–4.0)
Relative Index: INVALID (ref 0.0–2.5)
Total CK: 37 U/L (ref 7–177)

## 2010-08-03 LAB — TSH: TSH: 4.3 u[IU]/mL (ref 0.350–4.500)

## 2010-08-03 LAB — TROPONIN I: Troponin I: 0.01 ng/mL (ref 0.00–0.06)

## 2010-08-25 ENCOUNTER — Other Ambulatory Visit: Payer: Self-pay | Admitting: Internal Medicine

## 2010-08-25 ENCOUNTER — Other Ambulatory Visit: Payer: Self-pay

## 2010-08-25 DIAGNOSIS — E119 Type 2 diabetes mellitus without complications: Secondary | ICD-10-CM

## 2010-08-26 ENCOUNTER — Ambulatory Visit (INDEPENDENT_AMBULATORY_CARE_PROVIDER_SITE_OTHER): Payer: Medicare HMO | Admitting: Internal Medicine

## 2010-08-26 ENCOUNTER — Encounter: Payer: Self-pay | Admitting: Internal Medicine

## 2010-08-26 DIAGNOSIS — I10 Essential (primary) hypertension: Secondary | ICD-10-CM

## 2010-08-26 DIAGNOSIS — E119 Type 2 diabetes mellitus without complications: Secondary | ICD-10-CM

## 2010-08-26 MED ORDER — HYDROCODONE-ACETAMINOPHEN 7.5-500 MG PO TABS: 1.0000 | ORAL_TABLET | Freq: Two times a day (BID) | ORAL | Status: DC | PRN

## 2010-08-26 NOTE — Progress Notes (Signed)
  Subjective:    Patient ID: Cynthia Singh, female    DOB: [birth year], 70 y.o.   MRN: [id number]  HPI Cynthia Singh presents for routine follow-up. She reports that her chronic pain has not been well controlled: she has been having breakthrough pain at night on a regimen of hydrocodone 7.5 AM and afternoon. She wants to increase to tid, to include a bedtime dose.  She has otherwise been doing well. She reports that her CBGs have been in the 200 range despite 3 medications. She is due for a follow-up A1C.   PMH, FamHx and SocHx reviewed for any changes and relevance.     Review of Systems Review of Systems  Constitutional:  Negative for fever, chills, activity change and unexpected weight change.  HENT:  Negative for hearing loss, ear pain, congestion, neck stiffness and postnasal drip.   Eyes: Negative for pain, discharge and visual disturbance.  Respiratory: Negative for chest tightness and wheezing.   Cardiovascular: Negative for chest pain and palpitations.       [No decreased exercise tolerance Gastrointestinal: [No change in bowel habit. No bloating or gas. No reflux or indigestion Genitourinary: Negative for urgency, frequency, flank pain and difficulty urinating.  Musculoskeletal: Negative for myalgias, back pain, arthralgias and gait problem.  Neurological: Negative for dizziness, tremors, weakness and headaches.  Hematological: Negative for adenopathy.  Psychiatric/Behavioral: Negative for behavioral problems and dysphoric mood.       Objective:   Physical Exam  [vitalsreviewed. Constitutional: She is oriented to person, place, and time. She appears well-developed and well-nourished. No distress.  HENT:  Head: Normocephalic and atraumatic.  Eyes: Conjunctivae and EOM are normal. Pupils are equal, round, and reactive to light.  Neck: Neck supple.  Cardiovascular: Normal rate and regular rhythm.   Pulmonary/Chest: Effort normal and breath sounds normal.  Abdominal: Soft.  Bowel sounds are normal.  Musculoskeletal: Normal range of motion.  Neurological: She is alert and oriented to person, place, and time. She has normal reflexes.  Skin: Skin is warm and dry.  Psychiatric: She has a normal mood and affect. Her behavior is normal.  Thereis decreased deep vibratory sensation in both feet.         Assessment & Plan:  1. Diabetes- due for A1C with recommendations to follow  2. Chronic back pain - will increase hydrocodone to 7.5 mg tid (AM, Afternoon and bedtime).   3. HTN- well controlled at today's visit

## 2010-09-20 ENCOUNTER — Inpatient Hospital Stay (HOSPITAL_COMMUNITY): Admission: RE | Admit: 2010-09-20 | Payer: Medicare HMO | Source: Ambulatory Visit

## 2010-10-03 NOTE — Discharge Summary (Signed)
[place]. [facility]  Patient:    Cynthia Singh, Cynthia Singh                     MRN: [id number] Adm. Date:  [id number] Disc. Date: [id number] Attending:  Norman Clay CC:         Hadassah Pais. Jeannetta Nap, M.D.   Discharge Summary  FINAL DIAGNOSES: 1. Chest pain and arm pain of undetermined etiology, myocardial infarction    ruled out. 2. Severe osteoarthritis of back. 3. Diabetes mellitus. 4. Hyperlipidemia. 5. Obesity. 6. Anxiety and depression. 7. Chronic headaches.  PROCEDURES:  Cardiac catheterization.  HISTORY:  This 70 year old female is a newly diagnosed diabetic who presented with upper left chest and arm pain described as tight and squeezing lasting 3 to 4 hours.  She was seen at Dr. Jeannetta Nap office, given nitroglycerin, was sent to the emergency room by ambulance.  She had recurrence of arm pain while in the hospital, and was admitted to rule out an MI.  Please see the previously dictated history and physical for remainder of the details.  HOSPITAL COURSE:  Lab studies showed a cholesterol of 189, triglycerides of 370, HDL cholesterol of 40, and LDL cholesterol of 75.  SGOT was 82 with an SGPT of 205.  WBC was 5800, hemoglobin 13.2, hematocrit 38.5.  CPK-MBs were normal.  She was placed on heparin and IV nitroglycerin, was observed overnight.  She continued to have some episodic arm pain, and underwent catheterization on January 26, 2000.  She had mild coronary artery disease without significant focal obstructive stenoses noted.  LV function was normal.  A chest x-ray showed normal heart size.  The results were discussed with the patient and she was allowed to go home that evening.  She did have a hemoglobin A1C that was remarkable for improvement in diabetic control with hemoglobin A1C now at 6.4.  The etiology of her chest pain was undetermined at this time.  She will be discharged on chlorpropamide 250 mg b.i.d., amitriptyline 150 mg  q.h.s., and enteric-coated aspirin daily.  We had thought about discharging her on Lescol, but had opted to hold in view of the cholesterol of 180, and we will let this be followed up as an outpatient.  She will follow up with Dr. Jeannetta Nap, and I will see her in one week for an appointment for followup. DD:  01/26/00 TD:  01/27/00 Job: [id number]

## 2010-10-03 NOTE — Cardiovascular Report (Signed)
[place]. [facility]  Patient:    Cynthia Singh, Cynthia Singh                     MRN: [id number] Proc. Date: 01/26/00 Adm. Date:  [id number] Attending:  Norman Clay CC:         Hadassah Pais. Jeannetta Nap, M.D.   Cardiac Catheterization  INDICATIONS:  Unstable angina in a patient with diabetes and hyperlipidemia.  COMMENTS ABOUT PROCEDURE:  The patient tolerated the procedure well without complications.  Following the procedure right femoral angiograms were made but due to the entry site being adjacent to a very large side branch estimated at 3 mm, the manual pressure was held.  Please see the attached data sheet for the descriptions of the catheters and medications used during the procedure. Versed 2 mg IV was used for sedation without complications.  HEMODYNAMIC DATA:  Aorta post contrast 136/69, LV post contrast 136/10.  ANGIOGRAPHIC DATA:  LEFT VENTRICULOGRAM:  The left ventriculogram performed in the 30 degree RAO projection.  The aortic valve was normal.  The mitral valve was normal.  The left ventricle is normal and size with normal wall motion throughout.  The estimated ejection fraction is 60-65%.  Coronary arteries arise and distribute normally.  There was mild calcification noted in the mitral annulus.  Left main:  The left main coronary artery appears normal.  Left anterior descending:  The left anterior descending has a large diagonal branch and has a 30% proximal narrowing.  The diagonal branch arises after the septal perforator.  Circumflex:  The circumflex has two marginal branches.  The first marginal branch has a 30-40% proximal stenosis.  Right coronary artery:  The right coronary artery is a very large dominant vessel with a posterior descending and several posterolateral branches.  There is mild to moderate diffuse irregularity present.  IMPRESSION: 1. Mild to moderate coronary artery disease with no severe focal    obstructive  stenoses noted. 2. Normal left ventricular function. DD:  01/26/00 TD:  01/26/00 Job: [id number]

## 2010-10-06 ENCOUNTER — Other Ambulatory Visit: Payer: Medicare HMO

## 2010-10-06 ENCOUNTER — Other Ambulatory Visit: Payer: Self-pay | Admitting: Internal Medicine

## 2010-10-10 ENCOUNTER — Ambulatory Visit: Payer: Medicare HMO | Admitting: Internal Medicine

## 2010-10-10 ENCOUNTER — Emergency Department (HOSPITAL_COMMUNITY)
Admission: EM | Admit: 2010-10-10 | Discharge: 2010-10-10 | Disposition: A | Discharge status: Home / Self Care | Payer: Medicare HMO | Attending: Emergency Medicine | Admitting: Emergency Medicine

## 2010-10-10 ENCOUNTER — Emergency Department (HOSPITAL_COMMUNITY): Payer: Medicare HMO

## 2010-10-10 DIAGNOSIS — N390 Urinary tract infection, site not specified: Secondary | ICD-10-CM | POA: Insufficient documentation

## 2010-10-10 DIAGNOSIS — K219 Gastro-esophageal reflux disease without esophagitis: Secondary | ICD-10-CM | POA: Insufficient documentation

## 2010-10-10 DIAGNOSIS — K5289 Other specified noninfective gastroenteritis and colitis: Secondary | ICD-10-CM | POA: Insufficient documentation

## 2010-10-10 DIAGNOSIS — I10 Essential (primary) hypertension: Secondary | ICD-10-CM | POA: Insufficient documentation

## 2010-10-10 DIAGNOSIS — R112 Nausea with vomiting, unspecified: Secondary | ICD-10-CM | POA: Insufficient documentation

## 2010-10-10 DIAGNOSIS — R1013 Epigastric pain: Secondary | ICD-10-CM | POA: Insufficient documentation

## 2010-10-10 DIAGNOSIS — R109 Unspecified abdominal pain: Secondary | ICD-10-CM | POA: Insufficient documentation

## 2010-10-10 DIAGNOSIS — R197 Diarrhea, unspecified: Secondary | ICD-10-CM | POA: Insufficient documentation

## 2010-10-10 DIAGNOSIS — E119 Type 2 diabetes mellitus without complications: Secondary | ICD-10-CM | POA: Insufficient documentation

## 2010-10-10 LAB — DIFFERENTIAL
Basophils Absolute: 0 10*3/uL (ref 0.0–0.1)
Basophils Relative: 0 % (ref 0–1)
Eosinophils Absolute: 0 10*3/uL (ref 0.0–0.7)
Eosinophils Relative: 0 % (ref 0–5)
Lymphocytes Relative: 39 % (ref 12–46)
Lymphs Abs: 3.9 10*3/uL (ref 0.7–4.0)
Monocytes Absolute: 0.9 10*3/uL (ref 0.1–1.0)
Monocytes Relative: 9 % (ref 3–12)
Neutro Abs: 5.1 10*3/uL (ref 1.7–7.7)
Neutrophils Relative %: 51 % (ref 43–77)

## 2010-10-10 LAB — COMPREHENSIVE METABOLIC PANEL
ALT: 18 U/L (ref 0–35)
AST: 24 U/L (ref 0–37)
Albumin: 4.2 g/dL (ref 3.5–5.2)
Alkaline Phosphatase: 86 U/L (ref 39–117)
BUN: 19 mg/dL (ref 6–23)
CO2: 27 mEq/L (ref 19–32)
Calcium: 10.1 mg/dL (ref 8.4–10.5)
Chloride: 95 mEq/L — ABNORMAL LOW (ref 96–112)
Creatinine, Ser: 0.71 mg/dL (ref 0.4–1.2)
GFR calc Af Amer: >60 mL/min (ref >60)
GFR calc non Af Amer: >60 mL/min (ref >60)
Glucose, Bld: 194 mg/dL — ABNORMAL HIGH (ref 70–99)
Potassium: 4 mEq/L (ref 3.5–5.1)
Sodium: 134 mEq/L — ABNORMAL LOW (ref 135–145)
Total Bilirubin: 0.3 mg/dL (ref 0.3–1.2)
Total Protein: 8.3 g/dL (ref 6.0–8.3)

## 2010-10-10 LAB — URINE MICROSCOPIC-ADD ON

## 2010-10-10 LAB — CBC
HCT: 44 % (ref 36.0–46.0)
Hemoglobin: 15.4 g/dL — ABNORMAL HIGH (ref 12.0–15.0)
MCH: 31.6 pg (ref 26.0–34.0)
MCHC: 35 g/dL (ref 30.0–36.0)
MCV: 90.2 fL (ref 78.0–100.0)
Platelets: 250 10*3/uL (ref 150–400)
RBC: 4.88 MIL/uL (ref 3.87–5.11)
RDW: 13.4 % (ref 11.5–15.5)
WBC: 10 10*3/uL (ref 4.0–10.5)

## 2010-10-10 LAB — URINALYSIS, ROUTINE W REFLEX MICROSCOPIC
Bilirubin Urine: NEGATIVE
Glucose, UA: 100 mg/dL — AB
Hgb urine dipstick: NEGATIVE
Ketones, ur: NEGATIVE mg/dL
Nitrite: NEGATIVE
Protein, ur: NEGATIVE mg/dL
Specific Gravity, Urine: 1.026 (ref 1.005–1.030)
Urobilinogen, UA: 0.2 mg/dL (ref 0.0–1.0)
pH: 5 (ref 5.0–8.0)

## 2010-10-10 LAB — LIPASE, BLOOD: Lipase: 23 U/L (ref 11–59)

## 2010-10-10 LAB — GLUCOSE, CAPILLARY: Glucose-Capillary: 213 mg/dL — ABNORMAL HIGH (ref 70–99)

## 2010-10-10 MED ORDER — IOHEXOL 300 MG/ML  SOLN
75.0000 mL | Freq: Once | INTRAMUSCULAR | Status: AC | PRN
  Administered 2010-10-10: 75 mL via INTRAVENOUS

## 2010-10-13 ENCOUNTER — Emergency Department (HOSPITAL_COMMUNITY): Payer: Medicare HMO

## 2010-10-13 ENCOUNTER — Emergency Department (HOSPITAL_COMMUNITY)
Admission: EM | Admit: 2010-10-13 | Discharge: 2010-10-14 | Disposition: A | Discharge status: Home / Self Care | Payer: Medicare HMO | Attending: Emergency Medicine | Admitting: Emergency Medicine

## 2010-10-13 DIAGNOSIS — Z79899 Other long term (current) drug therapy: Secondary | ICD-10-CM | POA: Insufficient documentation

## 2010-10-13 DIAGNOSIS — R197 Diarrhea, unspecified: Secondary | ICD-10-CM | POA: Insufficient documentation

## 2010-10-13 DIAGNOSIS — R1033 Periumbilical pain: Secondary | ICD-10-CM | POA: Insufficient documentation

## 2010-10-13 DIAGNOSIS — R112 Nausea with vomiting, unspecified: Secondary | ICD-10-CM | POA: Insufficient documentation

## 2010-10-13 DIAGNOSIS — I10 Essential (primary) hypertension: Secondary | ICD-10-CM | POA: Insufficient documentation

## 2010-10-13 DIAGNOSIS — K219 Gastro-esophageal reflux disease without esophagitis: Secondary | ICD-10-CM | POA: Insufficient documentation

## 2010-10-13 DIAGNOSIS — E119 Type 2 diabetes mellitus without complications: Secondary | ICD-10-CM | POA: Insufficient documentation

## 2010-10-13 LAB — URINALYSIS, ROUTINE W REFLEX MICROSCOPIC
Bilirubin Urine: NEGATIVE
Glucose, UA: NEGATIVE mg/dL
Hgb urine dipstick: NEGATIVE
Ketones, ur: NEGATIVE mg/dL
Nitrite: NEGATIVE
Protein, ur: NEGATIVE mg/dL
Specific Gravity, Urine: 1.011 (ref 1.005–1.030)
Urobilinogen, UA: 0.2 mg/dL (ref 0.0–1.0)
pH: 5.5 (ref 5.0–8.0)

## 2010-10-13 LAB — CBC
HCT: 44.4 % (ref 36.0–46.0)
Hemoglobin: 15.1 g/dL — ABNORMAL HIGH (ref 12.0–15.0)
MCH: 30.2 pg (ref 26.0–34.0)
MCHC: 34 g/dL (ref 30.0–36.0)
MCV: 88.8 fL (ref 78.0–100.0)
Platelets: 216 10*3/uL (ref 150–400)
RBC: 5 MIL/uL (ref 3.87–5.11)
RDW: 13.1 % (ref 11.5–15.5)
WBC: 8.5 10*3/uL (ref 4.0–10.5)

## 2010-10-13 LAB — CK TOTAL AND CKMB (NOT AT ARMC)
CK, MB: 2 ng/mL (ref 0.3–4.0)
Relative Index: INVALID (ref 0.0–2.5)
Total CK: 56 U/L (ref 7–177)

## 2010-10-13 LAB — DIFFERENTIAL
Basophils Absolute: 0 10*3/uL (ref 0.0–0.1)
Basophils Relative: 0 % (ref 0–1)
Eosinophils Absolute: 0 10*3/uL (ref 0.0–0.7)
Eosinophils Relative: 0 % (ref 0–5)
Lymphocytes Relative: 45 % (ref 12–46)
Lymphs Abs: 3.8 10*3/uL (ref 0.7–4.0)
Monocytes Absolute: 0.7 10*3/uL (ref 0.1–1.0)
Monocytes Relative: 8 % (ref 3–12)
Neutro Abs: 4 10*3/uL (ref 1.7–7.7)
Neutrophils Relative %: 47 % (ref 43–77)

## 2010-10-13 LAB — BASIC METABOLIC PANEL
BUN: 12 mg/dL (ref 6–23)
CO2: 27 mEq/L (ref 19–32)
Calcium: 9.7 mg/dL (ref 8.4–10.5)
Chloride: 99 mEq/L (ref 96–112)
Creatinine, Ser: 0.63 mg/dL (ref 0.4–1.2)
GFR calc Af Amer: >60 mL/min (ref >60)
GFR calc non Af Amer: >60 mL/min (ref >60)
Glucose, Bld: 157 mg/dL — ABNORMAL HIGH (ref 70–99)
Potassium: 3.8 mEq/L (ref 3.5–5.1)
Sodium: 138 mEq/L (ref 135–145)

## 2010-10-13 LAB — TROPONIN I: Troponin I: <0.3 ng/mL (ref <0.30)

## 2010-10-20 ENCOUNTER — Encounter: Payer: Self-pay | Admitting: Internal Medicine

## 2010-10-20 ENCOUNTER — Ambulatory Visit: Payer: Medicare HMO | Admitting: Internal Medicine

## 2010-12-08 ENCOUNTER — Ambulatory Visit (INDEPENDENT_AMBULATORY_CARE_PROVIDER_SITE_OTHER): Payer: Medicare HMO | Admitting: Internal Medicine

## 2010-12-08 VITALS — BP 150/72 | HR 74 | Temp 99.0°F | Wt 227.0 lb

## 2010-12-08 DIAGNOSIS — I10 Essential (primary) hypertension: Secondary | ICD-10-CM

## 2010-12-08 DIAGNOSIS — E119 Type 2 diabetes mellitus without complications: Secondary | ICD-10-CM

## 2010-12-08 DIAGNOSIS — M25569 Pain in unspecified knee: Secondary | ICD-10-CM

## 2010-12-08 DIAGNOSIS — M25561 Pain in right knee: Secondary | ICD-10-CM | POA: Insufficient documentation

## 2010-12-08 MED ORDER — PIOGLITAZONE HCL 45 MG PO TABS: 45.0000 mg | ORAL_TABLET | Freq: Every day | ORAL | Status: DC

## 2010-12-08 MED ORDER — HYDROCODONE-ACETAMINOPHEN 7.5-500 MG PO TABS: 1.0000 | ORAL_TABLET | Freq: Three times a day (TID) | ORAL | Status: DC | PRN

## 2010-12-08 MED ORDER — OLMESARTAN MEDOXOMIL 40 MG PO TABS: 40.0000 mg | ORAL_TABLET | Freq: Every day | ORAL | Status: DC

## 2010-12-08 NOTE — Patient Instructions (Signed)
Continue all other medications as before You will be contacted regarding the referral for: orthopedic

## 2010-12-08 NOTE — Assessment & Plan Note (Signed)
Acute large right knee effusion, I suspect DJD related but  Cant r/o cartilage tear;  For pain med refill, refer ortho per pt request

## 2010-12-08 NOTE — Progress Notes (Signed)
Subjective:    Patient ID: Cynthia Singh, female    DOB: 1940-08-10, 71 y.o.   MRN: [id number]  HPI Here with "right sided pain and numbness,"  Further hx indicates she has had acute right knee pain/swelling for 1 wk, mild to mod, sharp with some radiation distally and proximally, worse to ambulate, better with hydrocodone (but now out of her ongoing chronic prescriptoin tx for chronic left knee pain and recurring back pain of no change).  Has a chronic mild RLE numbness as well from right back to distal leg (?chronic lumbar radiculopathy) as well mild bilat paresthesias to the distal UE's without other pain, and for all she declines need for further evaluation at this time.  Pt denies chest pain, increased sob or doe, wheezing, orthopnea, PND, increased LE swelling, palpitations, dizziness or syncope.   Pt denies polydipsia, polyuria, or low sugar symptoms such as weakness or confusion improved with po intake.   Does need refill benicar/actos as well - out for 2 days. Past Medical History  Diagnosis Date  . Osteoarthritis of spine   . Hyperlipemia   . Hypertension   . Diabetes mellitus, type 2   . Depressive disorder    Past Surgical History  Procedure Date  . Gallbladder surgery 1968  . Appendectomy 1959  . Combined hysterectomy abdominal w/ a&p repair / oophorectomy 1968  . Multiple d&c   . Tonsillectomy age 82    reports that she has never smoked. She does not have any smokeless tobacco history on file. Her alcohol and drug histories not on file. family history includes Cancer in her brother.  There is no history of Coronary artery disease and Heart attack. No Known Allergies Current Outpatient Prescriptions on File Prior to Visit  Medication Sig Dispense Refill  . amitriptyline (ELAVIL) 150 MG tablet Take 150 mg by mouth at bedtime.        . Cholecalciferol (VITAMIN D3) 1000 UNITS CAPS Take by mouth daily.        . furosemide (LASIX) 20 MG tablet Take 20 mg by mouth daily.          . Garlic Oil 500 MG CAPS Take by mouth daily.        . metFORMIN (GLUMETZA) 1000 MG (MOD) 24 hr tablet Take 1,000 mg by mouth 2 (two) times daily with a meal.        . sitaGLIPtan (JANUVIA) 100 MG tablet Take 100 mg by mouth daily.         Review of Systems Review of Systems  Constitutional: Negative for diaphoresis and unexpected weight change.  HENT: Negative for drooling and tinnitus.   Eyes: Negative for photophobia and visual disturbance.  Respiratory: Negative for choking and stridor.   Gastrointestinal: Negative for vomiting and blood in stool.  Genitourinary: Negative for hematuria and decreased urine volume.   Skin: Negative for color change and wound.    Objective:   Physical Exam BP 150/72  Pulse 74  Temp 99 F (37.2 C)  Wt 227 lb (102.967 kg)  SpO2 98% Physical Exam  VS noted Constitutional: Pt appears well-developed and well-nourished.  HENT: Head: Normocephalic.  Right Ear: External ear normal.  Left Ear: External ear normal.  Eyes: Conjunctivae and EOM are normal. Pupils are equal, round, and reactive to light.  Neck: Normal range of motion. Neck supple.  Cardiovascular: Normal rate and regular rhythm.   Pulmonary/Chest: Effort normal and breath sounds normal.  Neurological: Pt is alert. No cranial nerve deficit.  o/w not done in detail Skin: Skin is warm. No erythema.  Psychiatric: Pt behavior is normal. Thought content normal. 1+ nervous Right knee with 2+ effusion, mild diffuse tender, no erythema, contusion, but has somewhat decreased ROM       Assessment & Plan:

## 2010-12-11 ENCOUNTER — Encounter: Payer: Self-pay | Admitting: Internal Medicine

## 2010-12-11 NOTE — Assessment & Plan Note (Signed)
stable overall by hx and exam, most recent data reviewed with pt, and pt to continue medical treatment as before, med refilled today, but needs close f/u with PCP  Lab Results  Component Value Date   HGBA1C  Value: 8.6 (NOTE)                                                                       According to the ADA Clinical Practice Recommendations for 2011, when HbA1c is used as a screening test:   >=6.5%   Diagnostic of Diabetes Mellitus           (if abnormal result  is confirmed)  5.7-6.4%   Increased risk of developing Diabetes Mellitus  References:Diagnosis and Classification of Diabetes Mellitus,Diabetes Care,2011,34(Suppl 1):S62-S69 and Standards of Medical Care in         Diabetes - 2011,Diabetes Care,2011,34  (Suppl 1):S11-S61.* 05/28/2010

## 2010-12-11 NOTE — Assessment & Plan Note (Signed)
stable overall by hx and exam, most recent data reviewed with pt, and pt to continue medical treatment as before; mild elev today I suspect situational  BP Readings from Last 3 Encounters:  12/11/10 150/72  08/26/10 122/82  07/10/10 142/82

## 2010-12-22 ENCOUNTER — Ambulatory Visit: Payer: Medicare HMO | Admitting: Internal Medicine

## 2011-01-22 ENCOUNTER — Ambulatory Visit: Payer: Medicare HMO | Admitting: Internal Medicine

## 2011-01-29 ENCOUNTER — Ambulatory Visit (INDEPENDENT_AMBULATORY_CARE_PROVIDER_SITE_OTHER): Payer: Medicare HMO | Admitting: Internal Medicine

## 2011-01-29 ENCOUNTER — Other Ambulatory Visit (INDEPENDENT_AMBULATORY_CARE_PROVIDER_SITE_OTHER): Payer: Medicare HMO

## 2011-01-29 ENCOUNTER — Other Ambulatory Visit: Payer: Self-pay | Admitting: Internal Medicine

## 2011-01-29 DIAGNOSIS — E785 Hyperlipidemia, unspecified: Secondary | ICD-10-CM

## 2011-01-29 DIAGNOSIS — I10 Essential (primary) hypertension: Secondary | ICD-10-CM

## 2011-01-29 DIAGNOSIS — E119 Type 2 diabetes mellitus without complications: Secondary | ICD-10-CM

## 2011-01-29 LAB — LIPID PANEL
Cholesterol: 205 mg/dL — ABNORMAL HIGH (ref 0–200)
HDL: 48.2 mg/dL (ref >39.00)
Total CHOL/HDL Ratio: 4
Triglycerides: 315 mg/dL — ABNORMAL HIGH (ref 0.0–149.0)
VLDL: 63 mg/dL — ABNORMAL HIGH (ref 0.0–40.0)

## 2011-01-29 LAB — COMPREHENSIVE METABOLIC PANEL
ALT: 19 U/L (ref 0–35)
AST: 20 U/L (ref 0–37)
Albumin: 3.9 g/dL (ref 3.5–5.2)
Alkaline Phosphatase: 70 U/L (ref 39–117)
BUN: 14 mg/dL (ref 6–23)
CO2: 28 mEq/L (ref 19–32)
Calcium: 9.2 mg/dL (ref 8.4–10.5)
Chloride: 103 mEq/L (ref 96–112)
Creatinine, Ser: 0.6 mg/dL (ref 0.4–1.2)
GFR: 101.83 mL/min (ref >60.00)
Glucose, Bld: 250 mg/dL — ABNORMAL HIGH (ref 70–99)
Potassium: 4.1 mEq/L (ref 3.5–5.1)
Sodium: 139 mEq/L (ref 135–145)
Total Bilirubin: 0.5 mg/dL (ref 0.3–1.2)
Total Protein: 7.4 g/dL (ref 6.0–8.3)

## 2011-01-29 LAB — HEMOGLOBIN A1C: Hgb A1c MFr Bld: 9.1 % — ABNORMAL HIGH (ref 4.6–6.5)

## 2011-01-29 LAB — LDL CHOLESTEROL, DIRECT: Direct LDL: 124.2 mg/dL

## 2011-01-29 MED ORDER — PROMETHAZINE HCL 12.5 MG PO TABS: 12.5000 mg | ORAL_TABLET | Freq: Four times a day (QID) | ORAL | Status: AC | PRN

## 2011-01-29 MED ORDER — HYDROCODONE-ACETAMINOPHEN 7.5-500 MG PO TABS: 1.0000 | ORAL_TABLET | Freq: Three times a day (TID) | ORAL | Status: DC | PRN

## 2011-01-29 NOTE — Patient Instructions (Signed)
Diabetes - will check A1C today. If elevated will need to consider basal insulin therapy.  Blood pressure - stable  Stomach pain - normal exam today. May be a virus or too much acid. Plan - take a nexium as needed or liquid antacid. For nause take promethazine 12.5 mg once every 6 hrs as needed.  Pain - refilled hydrocodone for 30 days with 2 refills.

## 2011-01-29 NOTE — Progress Notes (Signed)
  Subjective:    Patient ID: Cynthia Singh, female    DOB: 17-Feb-1941, 70 y.o.   MRN: [id number]  HPI Cynthia Singh porsents for medication refill. She reports that her blood sugars have been in the 200 range. She is adherent to a DM diet. She has no new c/o or problems. She is concerned about her husband having increased SOB.  I have reviewed the patient's medical history in detail and updated the computerized patient record.    Review of Systems System review is negative for any constitutional, cardiac, pulmonary, GI or neuro symptoms or complaints     Objective:   Physical Exam Vitals reviewed - BP is mildly elevated. Gan'l- overweight white woman in NAD HEENT - C&S clear, PERRLA Chest- normal respirations Cor - RRR       Assessment & Plan:

## 2011-02-01 NOTE — Assessment & Plan Note (Signed)
Blood sugar is out of control with A1C 9.1%  Plan - ROV to discuss modifications in treatment regimen

## 2011-02-01 NOTE — Assessment & Plan Note (Signed)
LDL at 124 is above goal of 100 or less.  Plan - ROV to discuss modifications in treatment.

## 2011-02-01 NOTE — Assessment & Plan Note (Signed)
BP Readings from Last 3 Encounters:  01/29/11 162/82  12/11/10 150/72  08/26/10 122/82   Last two visit BP has been out of control.  Plan - will discuss mgt at next OV

## 2011-02-02 ENCOUNTER — Encounter: Payer: Self-pay | Admitting: Internal Medicine

## 2011-04-12 ENCOUNTER — Emergency Department (HOSPITAL_COMMUNITY)
Admission: EM | Admit: 2011-04-12 | Discharge: 2011-04-13 | Disposition: A | Discharge status: Home / Self Care | Payer: Medicare HMO | Attending: Emergency Medicine | Admitting: Emergency Medicine

## 2011-04-12 ENCOUNTER — Emergency Department (HOSPITAL_COMMUNITY): Payer: Medicare HMO

## 2011-04-12 ENCOUNTER — Encounter (HOSPITAL_COMMUNITY): Payer: Self-pay | Admitting: Emergency Medicine

## 2011-04-12 DIAGNOSIS — H9209 Otalgia, unspecified ear: Secondary | ICD-10-CM | POA: Insufficient documentation

## 2011-04-12 DIAGNOSIS — Z79899 Other long term (current) drug therapy: Secondary | ICD-10-CM | POA: Insufficient documentation

## 2011-04-12 DIAGNOSIS — F3289 Other specified depressive episodes: Secondary | ICD-10-CM | POA: Insufficient documentation

## 2011-04-12 DIAGNOSIS — E119 Type 2 diabetes mellitus without complications: Secondary | ICD-10-CM | POA: Insufficient documentation

## 2011-04-12 DIAGNOSIS — S0083XA Contusion of other part of head, initial encounter: Secondary | ICD-10-CM | POA: Insufficient documentation

## 2011-04-12 DIAGNOSIS — S1093XA Contusion of unspecified part of neck, initial encounter: Secondary | ICD-10-CM | POA: Insufficient documentation

## 2011-04-12 DIAGNOSIS — S0003XA Contusion of scalp, initial encounter: Secondary | ICD-10-CM | POA: Insufficient documentation

## 2011-04-12 DIAGNOSIS — F329 Major depressive disorder, single episode, unspecified: Secondary | ICD-10-CM | POA: Insufficient documentation

## 2011-04-12 DIAGNOSIS — S0990XA Unspecified injury of head, initial encounter: Secondary | ICD-10-CM | POA: Insufficient documentation

## 2011-04-12 DIAGNOSIS — R110 Nausea: Secondary | ICD-10-CM | POA: Insufficient documentation

## 2011-04-12 DIAGNOSIS — E785 Hyperlipidemia, unspecified: Secondary | ICD-10-CM | POA: Insufficient documentation

## 2011-04-12 DIAGNOSIS — R51 Headache: Secondary | ICD-10-CM | POA: Insufficient documentation

## 2011-04-12 DIAGNOSIS — Z9889 Other specified postprocedural states: Secondary | ICD-10-CM | POA: Insufficient documentation

## 2011-04-12 DIAGNOSIS — M1990 Unspecified osteoarthritis, unspecified site: Secondary | ICD-10-CM | POA: Insufficient documentation

## 2011-04-12 DIAGNOSIS — IMO0002 Reserved for concepts with insufficient information to code with codable children: Secondary | ICD-10-CM | POA: Insufficient documentation

## 2011-04-12 DIAGNOSIS — I10 Essential (primary) hypertension: Secondary | ICD-10-CM | POA: Insufficient documentation

## 2011-04-12 DIAGNOSIS — M542 Cervicalgia: Secondary | ICD-10-CM | POA: Insufficient documentation

## 2011-04-12 MED ORDER — ONDANSETRON 4 MG PO TBDP
4.0000 mg | ORAL_TABLET | Freq: Once | ORAL | Status: AC
  Administered 2011-04-12: 4 mg via ORAL
  Filled 2011-04-12: qty 1

## 2011-04-12 MED ORDER — HYDROCODONE-ACETAMINOPHEN 5-325 MG PO TABS
1.0000 | ORAL_TABLET | Freq: Once | ORAL | Status: AC
  Administered 2011-04-12: 1 via ORAL
  Filled 2011-04-12: qty 1

## 2011-04-12 MED ORDER — KETOROLAC TROMETHAMINE 30 MG/ML IJ SOLN
30.0000 mg | Freq: Once | INTRAMUSCULAR | Status: AC
Start: 2011-04-12 — End: 2011-04-12
  Administered 2011-04-12: 30 mg via INTRAMUSCULAR
  Filled 2011-04-12: qty 1

## 2011-04-12 NOTE — ED Notes (Signed)
Pt vomiting at this time.  Verbal order received for Zofran

## 2011-04-12 NOTE — ED Notes (Signed)
Pt st's she reached under a cabinet and when she raised up she hit her head.  Since then she has had bil ear pain, and dizziness.  Denies LOC.

## 2011-04-12 NOTE — ED Provider Notes (Signed)
History     CSN: [id number] Arrival date & time: 04/12/2011  5:23 PM   First MD Initiated Contact with Patient 04/12/11 2035      No chief complaint on file.   (Consider location/radiation/quality/duration/timing/severity/associated sxs/prior treatment) HPI Comments: Patient was under the sink cabinet when he son called her name she stood up quickly hitting the top of her head and jamming her neck, since she has had a headache, nausea, ear pain and neck pain She has tried OTC meds without any relief. Denies visual disturbance.  Pain in lateral neck when turning head to either side gets some relief when laying flat, when she is upright her head feels "heavy" Denis other injury/previous neck injury   The history is provided by the patient.    Past Medical History  Diagnosis Date  . Osteoarthritis of spine   . Hyperlipemia   . Hypertension   . Diabetes mellitus, type 2   . Depressive disorder     Past Surgical History  Procedure Date  . Gallbladder surgery 1968  . Appendectomy 1959  . Combined hysterectomy abdominal w/ a&p repair / oophorectomy 1968  . Multiple d&c   . Tonsillectomy age 47    Family History  Problem Relation Age of Onset  . Cancer Brother     Colon  . Coronary artery disease Neg Hx   . Heart attack Neg Hx     History  Substance Use Topics  . Smoking status: Never Smoker   . Smokeless tobacco: Not on file  . Alcohol Use: No    OB History    Grav Para Term Preterm Abortions TAB SAB Ect Mult Living                  Review of Systems  Constitutional: Negative.   HENT: Positive for ear pain and neck pain. Negative for hearing loss, nosebleeds, neck stiffness, tinnitus and ear discharge.   Eyes: Negative for visual disturbance.  Respiratory: Negative.   Cardiovascular: Negative.   Gastrointestinal: Positive for nausea. Negative for vomiting.  Genitourinary: Negative.   Musculoskeletal: Negative for back pain.  Skin: Negative.   Neurological:  Positive for headaches. Negative for dizziness, syncope, weakness and light-headedness.  Hematological: Negative.   Psychiatric/Behavioral: Negative.     Allergies  Review of patient's allergies indicates no known allergies.  Home Medications   Current Outpatient Rx  Name Route Sig Dispense Refill  . AMITRIPTYLINE HCL 150 MG PO TABS Oral Take 150 mg by mouth at bedtime.     Marland Kitchen VITAMIN D3 1000 UNITS PO CAPS Oral Take by mouth daily.      Marland Kitchen FERROUS GLUCONATE 325 MG PO TABS Oral Take 325 mg by mouth daily with breakfast.      . GARLIC OIL 500 MG PO CAPS Oral Take by mouth daily.      Marland Kitchen HYDROCODONE-ACETAMINOPHEN 7.5-500 MG PO TABS Oral Take 1 tablet by mouth every 8 (eight) hours as needed. For pain     . METFORMIN HCL ER (MOD) 1000 MG PO TB24 Oral Take 1,000 mg by mouth 2 (two) times daily with a meal.      . OLMESARTAN MEDOXOMIL 40 MG PO TABS Oral Take 1 tablet (40 mg total) by mouth daily. 90 tablet 1  . PIOGLITAZONE HCL 45 MG PO TABS Oral Take 1 tablet (45 mg total) by mouth daily. 90 tablet 1  . SITAGLIPTIN PHOSPHATE 100 MG PO TABS Oral Take 100 mg by mouth daily.      Marland Kitchen  OXYCODONE-ACETAMINOPHEN 5-325 MG PO TABS Oral Take 2 tablets by mouth every 4 (four) hours as needed for pain. 9 tablet 0    BP 174/78  Pulse 90  Temp(Src) 98.3 F (36.8 C) (Oral)  Resp 20  SpO2 97%  Physical Exam  Constitutional: She is oriented to person, place, and time. She appears well-developed and well-nourished.  HENT:  Head: Normocephalic. Head is with contusion. Head is without laceration.  Right Ear: Hearing, tympanic membrane, external ear and ear canal normal.  Left Ear: Hearing, tympanic membrane, external ear and ear canal normal.  Mouth/Throat: Uvula is midline.  Neck: Neck supple. No JVD present. Muscular tenderness present. No tracheal tenderness and no spinous process tenderness present. Carotid bruit is not present. No rigidity.    Cardiovascular: Regular rhythm.   Pulmonary/Chest: Breath  sounds normal.  Musculoskeletal: Normal range of motion. She exhibits no tenderness.  Neurological: She is oriented to person, place, and time.  Skin: Skin is warm and dry.  Psychiatric: She has a normal mood and affect.    ED Course  Procedures (including critical care time)  Labs Reviewed - No data to display Dg Cervical Spine Complete  04/12/2011  *RADIOLOGY REPORT*  Clinical Data: Head injury and neck pain.  CERVICAL SPINE - COMPLETE 4+ VIEW  Comparison: 07/10/2010  Findings: Stable alignment and appearance of the cervical spine with bulky osteophytes again noted anteriorly at multiple levels. No evidence of fracture or subluxation.  No soft tissue swelling. The visualized airway is unremarkable.  IMPRESSION: Stable alignment and bulky cervical osteophytes at multiple levels. No acute fracture identified.  Original Report Authenticated By: Reola Calkins, M.D.     1. Neck pain   2. Minor head injury     After I have a morphine patient feels better.  No longer having headache, neck pain, ear pain, will discharge him for followup with Dr. Deeann Dowse as needed.  Suggest calling him in the morning giving her 9 Percocet tablets.  If she needs more than this she may well need to be seen again  MDM  Most likely contusion, will xray neck to rule out compression Fx . Will medicate for pain  Patient vomited the Vicodin.  She was given sublingual ODT Zofran with control of her nausea.  Will now medicate with IM Toradol after review of the x-ray of her C-spine was treated for minor head contusion and have patient followup with primary care physician        Arman Filter, NP 04/13/11 0225  This chart was openned in error

## 2011-04-13 MED ORDER — OXYCODONE-ACETAMINOPHEN 5-325 MG PO TABS: 2.0000 | ORAL_TABLET | ORAL | Status: DC | PRN

## 2011-04-13 MED ORDER — MORPHINE SULFATE 4 MG/ML IJ SOLN
4.0000 mg | Freq: Once | INTRAMUSCULAR | Status: AC
  Administered 2011-04-13: 4 mg via INTRAMUSCULAR
  Filled 2011-04-13: qty 1

## 2011-04-13 NOTE — Discharge Instructions (Signed)
Blunt Trauma °You have been evaluated for injuries. You have been examined and your caregiver has not found injuries serious enough to require hospitalization. °It is common to have multiple bruises and sore muscles following an accident. These tend to feel worse for the first 24 hours. You will feel more stiffness and soreness over the next several hours and worse when you wake up the first morning after your accident. After this point, you should begin to improve with each passing day. The amount of improvement depends on the amount of damage done in the accident. °Following your accident, if some part of your body does not work as it should, or if the pain in any area continues to increase, you should return to the Emergency Department for re-evaluation.  °HOME CARE INSTRUCTIONS  °Routine care for sore areas should include: °· Ice to sore areas every 2 hours for 20 minutes while awake for the next 2 days.  °· Drink extra fluids (not alcohol).  °· Take a hot or warm shower or bath once or twice a day to increase blood flow to sore muscles. This will help you "limber up".  °· Activity as tolerated. Lifting may aggravate neck or back pain.  °· Only take over-the-counter or prescription medicines for pain, discomfort, or fever as directed by your caregiver. Do not use aspirin. This may increase bruising or increase bleeding if there are small areas where this is happening.  °SEEK IMMEDIATE MEDICAL CARE IF: °· Numbness, tingling, weakness, or problem with the use of your arms or legs.  °· A severe headache is not relieved with medications.  °· There is a change in bowel or bladder control.  °· Increasing pain in any areas of the body.  °· Short of breath or dizzy.  °· Nauseated, vomiting, or sweating.  °· Increasing belly (abdominal) discomfort.  °· Blood in urine, stool, or vomiting blood.  °· Pain in either shoulder in an area where a shoulder strap would be.  °· Feelings of lightheadedness or if you have a fainting  episode.  °Sometimes it is not possible to identify all injuries immediately after the trauma. It is important that you continue to monitor your condition after the emergency department visit. If you feel you are not improving, or improving more slowly than should be expected, call your physician. If you feel your symptoms (problems) are worsening, return to the Emergency Department immediately. °Document Released: 01/28/2001 Document Revised: 01/14/2011 Document Reviewed: 12/21/2007 °Exit[employer]® Patient Information ©2012 [employer]. °

## 2011-04-14 ENCOUNTER — Encounter: Payer: Self-pay | Admitting: Internal Medicine

## 2011-04-14 ENCOUNTER — Ambulatory Visit (INDEPENDENT_AMBULATORY_CARE_PROVIDER_SITE_OTHER): Payer: Medicare HMO | Admitting: Internal Medicine

## 2011-04-14 VITALS — BP 148/86 | HR 86 | Temp 98.2°F

## 2011-04-14 DIAGNOSIS — M542 Cervicalgia: Secondary | ICD-10-CM

## 2011-04-14 DIAGNOSIS — H8110 Benign paroxysmal vertigo, unspecified ear: Secondary | ICD-10-CM

## 2011-04-14 DIAGNOSIS — S0990XA Unspecified injury of head, initial encounter: Secondary | ICD-10-CM

## 2011-04-14 DIAGNOSIS — S098XXA Other specified injuries of head, initial encounter: Secondary | ICD-10-CM

## 2011-04-14 MED ORDER — HYDROCODONE-ACETAMINOPHEN 7.5-500 MG PO TABS: 1.0000 | ORAL_TABLET | Freq: Three times a day (TID) | ORAL | Status: DC | PRN

## 2011-04-14 MED ORDER — MECLIZINE HCL 25 MG PO TABS: 25.0000 mg | ORAL_TABLET | Freq: Three times a day (TID) | ORAL | Status: DC | PRN

## 2011-04-14 NOTE — Progress Notes (Signed)
  Subjective:    Patient ID: Cynthia Singh, female    DOB: Jan 17, 1941, 70 y.o.   MRN: [id number]  HPI Here for evaluation of neck and head pain -ER followup Seen in the emergency room for same 48 hours ago, 04/12/2011 Precipitated by self-induced accidental injury: Patient was working under sink when raised up quickly after hearing her name called, hitting top side of head  No loss of consciousness or visual changes - C-spine series done in the emergency room reviewed> no acute fracture Persisting pain in head and neck area since that time - associated with mild dizzy sensation and right ear fullness - Also sneezing and runny nose since waking this morning, no sinus pain ear pain or discharge. Pain minimally improved with prescribed pain medication in addition to chronic narcotic use History of chronic neck pain preceding injury - requests refill for monthly supply  Past Medical History  Diagnosis Date  . Osteoarthritis of spine   . Hyperlipemia   . Hypertension   . Diabetes mellitus, type 2   . Depressive disorder     Review of Systems ENT : Complains of sore throat and runny nose in the last 12 hours -no purulent nasal discharge or bleeding  Constitutional: Denies fever or night sweats Neuro: No confusion or altered mental status, no sedation    Objective:   Physical Exam BP 148/86  Pulse 86  Temp(Src) 98.2 F (36.8 C) (Oral)  SpO2 97% Gen: No acute distress, daughter at side HENT: Bilateral TMs clear, no effusion or erythema; oropharynx with mild erythema, no exudate Lungs: Clear to auscultation, no wheeze Cardiovascular, regular rate rhythm Skin: Scalp intact without laceration or bruise, no swelling Neuro: Awake alert oriented x4, CNs 2-12 symmetrically intact, moves all extremity swelling, balance normal, cognition and speech normal       Assessment & Plan:  Acute on chronic neck pain Headache following mild head trauma Vertigo Early viral URI symptoms  ER  records reviewed Neuro exam intact, reassurance provided Meclizine prescribed to use when necessary for vertigo symptoms Discussion with primary care physician (norins) in office today regarding pain medication refill as patient is managed with narcotic contract>> I will refill usual month of 90 tablets Lortab at this time, no refills and recommend followup with primary care physician for next visit  Time spent with pt/family today 25 minutes, greater than 50% time spent counseling patient on head trauma and mild residual symptoms, ER visit and review of records, early viral symptoms and vertigo as well as medication review and discussion with primary care physician.

## 2011-04-14 NOTE — Patient Instructions (Signed)
We have reviewed your ER records and x-ray results today Meclizine prescribed for your symptoms and dizziness - Also one-month supply of pain pills provided as per your request. No refills from me, will need to followup with Dr. Alvera Novel on this issue as per your pain contract

## 2011-04-20 ENCOUNTER — Other Ambulatory Visit (INDEPENDENT_AMBULATORY_CARE_PROVIDER_SITE_OTHER): Payer: Medicare HMO

## 2011-04-20 ENCOUNTER — Other Ambulatory Visit: Payer: Self-pay | Admitting: Internal Medicine

## 2011-04-20 ENCOUNTER — Ambulatory Visit (INDEPENDENT_AMBULATORY_CARE_PROVIDER_SITE_OTHER): Payer: Medicare HMO | Admitting: Internal Medicine

## 2011-04-20 VITALS — BP 128/72 | HR 80 | Temp 98.4°F | Wt 225.0 lb

## 2011-04-20 DIAGNOSIS — M25569 Pain in unspecified knee: Secondary | ICD-10-CM

## 2011-04-20 DIAGNOSIS — E785 Hyperlipidemia, unspecified: Secondary | ICD-10-CM

## 2011-04-20 DIAGNOSIS — E119 Type 2 diabetes mellitus without complications: Secondary | ICD-10-CM

## 2011-04-20 LAB — COMPREHENSIVE METABOLIC PANEL
ALT: 23 U/L (ref 0–35)
AST: 17 U/L (ref 0–37)
Albumin: 4.2 g/dL (ref 3.5–5.2)
Alkaline Phosphatase: 75 U/L (ref 39–117)
BUN: 15 mg/dL (ref 6–23)
CO2: 25 mEq/L (ref 19–32)
Calcium: 9.1 mg/dL (ref 8.4–10.5)
Chloride: 101 mEq/L (ref 96–112)
Creatinine, Ser: 0.7 mg/dL (ref 0.4–1.2)
GFR: 96.36 mL/min (ref >60.00)
Glucose, Bld: 291 mg/dL — ABNORMAL HIGH (ref 70–99)
Potassium: 4.6 mEq/L (ref 3.5–5.1)
Sodium: 136 mEq/L (ref 135–145)
Total Bilirubin: 0.3 mg/dL (ref 0.3–1.2)
Total Protein: 7.4 g/dL (ref 6.0–8.3)

## 2011-04-20 LAB — HEMOGLOBIN A1C: Hgb A1c MFr Bld: 8.5 % — ABNORMAL HIGH (ref 4.6–6.5)

## 2011-04-20 LAB — LIPID PANEL
Cholesterol: 221 mg/dL — ABNORMAL HIGH (ref 0–200)
HDL: 34.6 mg/dL — ABNORMAL LOW (ref >39.00)
Total CHOL/HDL Ratio: 6
Triglycerides: 943 mg/dL — ABNORMAL HIGH (ref 0.0–149.0)
VLDL: 188.6 mg/dL — ABNORMAL HIGH (ref 0.0–40.0)

## 2011-04-20 MED ORDER — OXYCODONE-ACETAMINOPHEN 5-325 MG PO TABS: 2.0000 | ORAL_TABLET | ORAL | Status: DC | PRN

## 2011-04-20 NOTE — Assessment & Plan Note (Signed)
Patient with chronic pain but doing OK. No adverse effects of medication. Plan - will provide Rx for Dec and Jan.

## 2011-04-20 NOTE — Progress Notes (Signed)
  Subjective:    Patient ID: Cynthia Singh, female    DOB: 1940/09/01, 70 y.o.   MRN: [id number]  HPI Presents for routine follow-up: has had neck injury but is making a good recovery. Read ED notes as well as Dr. Diamantina Monks follow-up note. She is due today for her controlled refills. She is feeling pretty good at this point with on-going chronic pain.   I have reviewed the patient's medical history in detail and updated the computerized patient record.    Review of Systems System review is negative for any constitutional, cardiac, pulmonary, GI or neuro symptoms or complaints other than as described in the HPI.     Objective:   Physical Exam Vitals stable Pulm - normal respirations Cor - 21+ pulse.       Assessment & Plan:

## 2011-04-20 NOTE — Assessment & Plan Note (Signed)
Lab Results  Component Value Date   HGBA1C 9.1* 01/29/2011   Plan - repeat A1C today with recommendations

## 2011-04-21 LAB — LDL CHOLESTEROL, DIRECT: Direct LDL: 69.8 mg/dL

## 2011-04-27 ENCOUNTER — Encounter: Payer: Self-pay | Admitting: Internal Medicine

## 2011-06-16 ENCOUNTER — Telehealth: Payer: Self-pay

## 2011-06-16 MED ORDER — AMITRIPTYLINE HCL 150 MG PO TABS: 150.0000 mg | ORAL_TABLET | Freq: Every day | ORAL | Status: DC

## 2011-06-16 NOTE — Telephone Encounter (Signed)
refill 

## 2011-06-24 ENCOUNTER — Encounter: Payer: Self-pay | Admitting: Internal Medicine

## 2011-06-24 ENCOUNTER — Ambulatory Visit (INDEPENDENT_AMBULATORY_CARE_PROVIDER_SITE_OTHER): Payer: Medicare HMO | Admitting: Internal Medicine

## 2011-06-24 DIAGNOSIS — I10 Essential (primary) hypertension: Secondary | ICD-10-CM

## 2011-06-24 DIAGNOSIS — M1990 Unspecified osteoarthritis, unspecified site: Secondary | ICD-10-CM

## 2011-06-24 DIAGNOSIS — E119 Type 2 diabetes mellitus without complications: Secondary | ICD-10-CM

## 2011-06-24 MED ORDER — HYDROCODONE-ACETAMINOPHEN 7.5-500 MG PO TABS: 1.0000 | ORAL_TABLET | Freq: Three times a day (TID) | ORAL | Status: DC | PRN

## 2011-06-24 NOTE — Assessment & Plan Note (Signed)
Chronic back pain.  Plan - resume Lortab (hydrocodone/APAP 7.5/325) tid Rx done with 3 refills

## 2011-06-24 NOTE — Progress Notes (Signed)
  Subjective:    Patient ID: Cynthia Singh, female    DOB: 1940-12-06, 71 y.o.   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 She has been taking percocet for this and an occasional aleve.   She has a skin tag type lesion on the right jaw line that is painful.  She needs refill on pain meds. She was somehow prescribed percocet instead of Lortab at last med refill and she prefers lortab \ I have reviewed the patient's medical history in detail and updated the computerized patient record.    Review of Systems System review is negative for any constitutional, cardiac, pulmonary, GI or neuro symptoms or complaints other than as described in the HPI.     Objective:   Physical Exam Filed Vitals:   06/24/11 1646  BP: 148/90  Pulse: 82  Temp: 97.4 F (36.3 C)  Resp: 16  Weight: 222 lb 12 oz (101.039 kg)   Gen'l- overweight white woman in no distress Pulm - normal respirations and breath sounds Cor - 2+ radial, RRR MSK - fingers right hand w/o erythema, synovial thickening or appreciable swelling. Tender with flexion and palpation Derm - skin tag appearing lesion on the jaw line right w/o erythema       Assessment & Plan:  1`. Skin  Tag - may return as convenient for skin tag removal.

## 2011-06-24 NOTE — Assessment & Plan Note (Signed)
BP Readings from Last 3 Encounters:  06/24/11 148/90  04/20/11 128/72  04/14/11 148/86   suboptimal control on present medications. Will make adjustment at next OV

## 2011-06-24 NOTE — Patient Instructions (Signed)
For arthritis - it is better to use an anti-inflammatory drug, e.,g. Aleve 1 or 2 twice a day. Be careful to watch for stomach irritation.  Chronic pain management - don't know how we got you on percocet. Plan - renew Rx for lortab 7.5 three times a day with 3 refills.   Osteoarthritis Osteoarthritis is the most common form of arthritis. It is redness, soreness, and swelling (inflammation) affecting the cartilage. Cartilage acts as a cushion, covering the ends of bones where they meet to form a joint. CAUSES   Over time, the cartilage begins to wear away. This causes bone to rub on bone. This produces pain and stiffness in the affected joints. Factors that contribute to this problem are:  Excessive body weight.     Age.    Overuse of joints.  SYMPTOMS    People with osteoarthritis usually experience joint pain, swelling, or stiffness.     Over time, the joint may lose its normal shape.     Small deposits of bone (osteophytes) may grow on the edges of the joint.     Bits of bone or cartilage can break off and float inside the joint space. This may cause more pain and damage.     Osteoarthritis can lead to depression, anxiety, feelings of helplessness, and limitations on daily activities.  The most commonly affected joints are in the:  Ends of the fingers.     Thumbs.    Neck.    Lower back.     Knees.    Hips.  DIAGNOSIS  Diagnosis is mostly based on your symptoms and exam. Tests may be helpful, including:  X-rays of the affected joint.     A computerized magnetic scan (MRI).     Blood tests to rule out other types of arthritis.     Joint fluid tests. This involves using a needle to draw fluid from the joint and examining the fluid under a microscope.  TREATMENT   Goals of treatment are to control pain, improve joint function, maintain a normal body weight, and maintain a healthy lifestyle. Treatment approaches may include:  A prescribed exercise program with rest  and joint relief.     Weight control with nutritional education.     Pain relief techniques such as:     Properly applied heat and cold.     Electric pulses delivered to nerve endings under the skin (transcutaneous electrical nerve stimulation, TENS).     Massage.    Certain supplements. Ask your caregiver before using any supplements, especially in combination with prescribed drugs.     Medicines to control pain, such as:     Acetaminophen.    Nonsteroidal anti-inflammatory drugs (NSAIDs), such as naproxen.     Narcotic or central-acting agents, such as tramadol. This drug carries a risk of addiction and is generally prescribed for short-term use.     Corticosteroids. These can be given orally or as injection. This is a short-term treatment, not recommended for routine use.     Surgery to reposition the bones and relieve pain (osteotomy) or to remove loose pieces of bone and cartilage. Joint replacement may be needed in advanced states of osteoarthritis.  HOME CARE INSTRUCTIONS   Your caregiver can recommend specific types of exercise. These may include:  Strengthening exercises. These are done to strengthen the muscles that support joints affected by arthritis. They can be performed with weights or with exercise bands to add resistance.     Aerobic activities. These  are exercises, such as brisk walking or low-impact aerobics, that get your heart pumping. They can help keep your lungs and circulatory system in shape.     Range-of-motion activities. These keep your joints limber.     Balance and agility exercises. These help you maintain daily living skills.  Learning about your condition and being actively involved in your care will help improve the course of your osteoarthritis. SEEK MEDICAL CARE IF:    You feel hot or your skin turns red.     You develop a rash in addition to your joint pain.     You have an oral temperature above 102 F (38.9 C).  FOR MORE INFORMATION    National Institute of Arthritis and Musculoskeletal and Skin Diseases: www.niams.http://www.myers.net/ [employer] on Aging: https://walker.com/ American College of Rheumatology: www.rheumatology.org Document Released: 05/04/2005 Document Revised: 01/14/2011 Document Reviewed: 08/15/2009 [facility] Patient Information 2012 [place], [place].

## 2011-06-24 NOTE — Assessment & Plan Note (Signed)
Lab Results  Component Value Date   HGBA1C 8.5* 04/20/2011   Due for follow-up in March

## 2011-08-05 ENCOUNTER — Ambulatory Visit (INDEPENDENT_AMBULATORY_CARE_PROVIDER_SITE_OTHER): Payer: Medicare HMO | Admitting: Internal Medicine

## 2011-08-05 VITALS — BP 138/72 | HR 80 | Temp 97.2°F | Resp 16 | Wt 224.2 lb

## 2011-08-05 DIAGNOSIS — D485 Neoplasm of uncertain behavior of skin: Secondary | ICD-10-CM

## 2011-08-05 DIAGNOSIS — E119 Type 2 diabetes mellitus without complications: Secondary | ICD-10-CM

## 2011-08-05 DIAGNOSIS — J069 Acute upper respiratory infection, unspecified: Secondary | ICD-10-CM

## 2011-08-05 MED ORDER — INSULIN PEN NEEDLE 31G X 8 MM MISC: 1.0000 | Freq: Every day | Status: DC

## 2011-08-05 MED ORDER — INSULIN GLARGINE 100 UNIT/ML ~~LOC~~ SOLN: 30.0000 [IU] | Freq: Every day | SUBCUTANEOUS | Status: DC

## 2011-08-05 NOTE — Patient Instructions (Addendum)
Allergic rhinitis vs viral upper respiratory infection. There is no evidence of a bacterial infection. Plan - otc generic claritin (loratadine or alavert), for congestion you can take sudafed 30 mg two or three times a day, for cough sugar free robitussin DM 1 tsp every 4 hours as needed.  Diabetes-  Lab Results  Component Value Date   HGBA1C 8.5* 04/20/2011   You have poor control on 3 oral agents. Plan is to start basal insulin therapy: lantus solostar pen starting at 30 units at bedtime. Check the blood sugar before breakfast. If it is 130 or higher for 3 days in a row increase the lantus by 3 units. Continue this process until you are controlled or you get to 60 units. STOP actos., continue metformin and [place].  Blood pressure is good on your present medications.   After mole removal - watch for any sign of infection - call for problems

## 2011-08-06 NOTE — Progress Notes (Signed)
Subjective:    Patient ID: Cynthia Singh, female    DOB: 05/23/1940, 71 y.o.   MRN: [id number]  HPI Presents with URI symptoms: cough, sinus congestion, pressure in the ears. No fever, productive cough, purulent rhinorrhea. No increased SOB  She has a mole/keratin horn on the right jaw which is aggravating her.  Blood sugar readings at home have been 200-300 on three oral medications.  Lab Results  Component Value Date   HGBA1C 8.5* 04/20/2011    Past Medical History  Diagnosis Date  . Osteoarthritis of spine   . Hyperlipemia   . Hypertension   . Diabetes mellitus, type 2   . Depressive disorder    Past Surgical History  Procedure Date  . Gallbladder surgery 1968  . Appendectomy 1959  . Combined hysterectomy abdominal w/ a&p repair / oophorectomy 1968  . Multiple d&c   . Tonsillectomy age 42   Family History  Problem Relation Age of Onset  . Cancer Brother     Colon  . Coronary artery disease Neg Hx   . Heart attack Neg Hx    History   Social History  . Marital Status: Married    Spouse Name: N/A    Number of Children: N/A  . Years of Education: N/A   Occupational History  . Not on file.   Social History Main Topics  . Smoking status: Never Smoker   . Smokeless tobacco: Not on file  . Alcohol Use: No  . Drug Use: No  . Sexually Active: Not on file   Other Topics Concern  . 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     Objective:   Physical Exam Filed Vitals:   08/05/11 1412  BP: 138/72  Pulse: 80  Temp: 97.2 F (36.2 C)  Resp: 16   Wt Readings from Last 3 Encounters:  08/05/11 224 lb 4 oz (101.719 kg)  06/24/11 222 lb 12 oz (101.039 kg)    04/20/11 225 lb (102.059 kg)   Gen'l- overweight white woman in no distress HEENT- sinus tenderness to percussion - left frontal, TM's normal, throat w/o erythema or exudate Cor - RRR Pulm - normal respirations, no rales or wheeze Derm - hard, spiky lesion just below right jaw line.    Procedure Note :     Procedure :  Skin biopsy-mole removal   Indication:  Changing mole, irritation and risk for infection   Risks including unsuccessful procedure , bleeding, infection, bruising, scar, a need for another complete procedure and others were explained to the patient in detail as well as the benefits. Informed consent was obtained and signed.   The patient was placed in a supine position.  Lesion #1 on right face, below jaw line measuring    2  mm   Skin over lesion #1  was prepped with Betadine and alcohol  and anesthetized with 1 cc of 2% lidocaine and epinephrine, using a 25-gauge 1 inch needle.  Shave biopsy with a sterile Dermablade was carried out in the usual fashion. It was attempted to biopsy with  1 mm free margins.  Hyfrecator was used to destroy the rest of the lesion potentially left behind and for hemostasis.  Postprocedure instructions :   You can take a shower tomorrow.  Keep the wounds clean. You can wash them with liquid soap and water.    You need to report immediately  if fever, chills or any signs of infection develop.            Assessment & Plan:  Viral URI -  Plan supportive care.  Mole removal - keratin horn, irritated. No complications

## 2011-08-06 NOTE — Assessment & Plan Note (Signed)
Poor control.  Plan - D/C actos, continue Januvia and metformin           Start lantus @ 30 units qHS. 3 day titration schedule explained up to a limit of 60 units q HS

## 2011-08-24 ENCOUNTER — Ambulatory Visit: Payer: Medicare HMO | Admitting: Endocrinology

## 2011-09-09 ENCOUNTER — Ambulatory Visit: Payer: Medicare HMO | Admitting: Internal Medicine

## 2011-10-05 ENCOUNTER — Ambulatory Visit (INDEPENDENT_AMBULATORY_CARE_PROVIDER_SITE_OTHER): Payer: Medicare HMO | Admitting: Internal Medicine

## 2011-10-05 ENCOUNTER — Encounter: Payer: Self-pay | Admitting: Internal Medicine

## 2011-10-05 ENCOUNTER — Other Ambulatory Visit (INDEPENDENT_AMBULATORY_CARE_PROVIDER_SITE_OTHER): Payer: Medicare HMO

## 2011-10-05 VITALS — BP 122/82 | HR 87 | Temp 98.5°F | Ht 68.5 in | Wt 227.8 lb

## 2011-10-05 DIAGNOSIS — R209 Unspecified disturbances of skin sensation: Secondary | ICD-10-CM

## 2011-10-05 DIAGNOSIS — IMO0002 Reserved for concepts with insufficient information to code with codable children: Secondary | ICD-10-CM

## 2011-10-05 DIAGNOSIS — R200 Anesthesia of skin: Secondary | ICD-10-CM

## 2011-10-05 DIAGNOSIS — R202 Paresthesia of skin: Secondary | ICD-10-CM

## 2011-10-05 DIAGNOSIS — M1710 Unilateral primary osteoarthritis, unspecified knee: Secondary | ICD-10-CM

## 2011-10-05 DIAGNOSIS — IMO0001 Reserved for inherently not codable concepts without codable children: Secondary | ICD-10-CM

## 2011-10-05 DIAGNOSIS — E119 Type 2 diabetes mellitus without complications: Secondary | ICD-10-CM

## 2011-10-05 DIAGNOSIS — M179 Osteoarthritis of knee, unspecified: Secondary | ICD-10-CM

## 2011-10-05 LAB — HEMOGLOBIN A1C: Hgb A1c MFr Bld: 9.4 % — ABNORMAL HIGH (ref 4.6–6.5)

## 2011-10-05 MED ORDER — HYDROCODONE-ACETAMINOPHEN 7.5-500 MG PO TABS: 1.0000 | ORAL_TABLET | Freq: Three times a day (TID) | ORAL | Status: DC | PRN

## 2011-10-05 MED ORDER — GABAPENTIN 300 MG PO CAPS: 300.0000 mg | ORAL_CAPSULE | Freq: Every day | ORAL | Status: DC

## 2011-10-05 NOTE — Patient Instructions (Signed)
It was good to see you today. We injected your Left knee with cortisone today - place ice over injection site tonight and tomorrow as needed Test(s) ordered today. Your results will be called to you after review (48-72hours after test completion). If any changes need to be made, you will be notified at that time. Start gabapentin for numbness pain as discussed 300mg  at bedtime - Your prescription(s) have been submitted to your pharmacy. Please take as directed and contact our office if you believe you are having problem(s) with the medication(s). Also refill on usual pain meds x 1 month until Dr Debby Bud returns Call for refer to ortho, or for follow up OV if toe numbness or knee pain is unimproved, sooner if worse

## 2011-10-05 NOTE — Progress Notes (Signed)
  Subjective:    Patient ID: Cynthia Singh, female    DOB: Jul 14, 1940, 71 y.o.   MRN: [id number]  HPI  Here for evaluation of numbness in both feet Onset >6 months ago Denies trauma or swelling No other numbness or tingling -  Continued L knee pain, ?cortisone injection   Past Medical History  Diagnosis Date  . Osteoarthritis of spine   . Hyperlipemia   . Hypertension   . Diabetes mellitus, type 2   . Depressive disorder     Review of Systems  Constitutional: Denies fever or night sweats Neuro: No confusion or altered mental status, no sedation    Objective:   Physical Exam  BP 122/82  Pulse 87  Temp(Src) 98.5 F (36.9 C) (Oral)  Ht 5' 8.5" (1.74 m)  Wt 227 lb 12.8 oz (103.329 kg)  BMI 34.13 kg/m2  SpO2 96% Gen: No acute distress, daughter at side Lungs: Clear to auscultation, no wheeze Cardiovascular, regular rate rhythm; no BLE edema Mskel: L knee - boggy synovitis - tender to palpation over joint line; FROM and ligamentous function intact Neuro: Awake alert oriented x4, CNs 2-12 symmetrically intact, moves all extremity swelling, balance normal, cognition and speech normal  Lab Results  Component Value Date   WBC 8.5 10/13/2010   HGB 15.1* 10/13/2010   HCT 44.4 10/13/2010   PLT 216 10/13/2010   GLUCOSE 291* 04/20/2011   CHOL 221* 04/20/2011   TRIG 943.0* 04/20/2011   HDL 34.60* 04/20/2011   LDLDIRECT 69.8 04/20/2011   LDLCALC  Value: UNABLE TO CALCULATE  05/29/2010   ALT 23 04/20/2011   AST 17 04/20/2011   NA 136 04/20/2011   K 4.6 04/20/2011   CL 101 04/20/2011   CREATININE 0.7 04/20/2011   BUN 15 04/20/2011   CO2 25 04/20/2011   TSH 3.861 05/28/2010   HGBA1C 8.5* 04/20/2011   Procedure Note: left knee intra-articular injection Indication: L OA knee flare with mild effusion the patient elects to proceed after verbal consent is obtained. the patient informed of possible risks and complications prior to procedure. Using sterile technique throughout, patient is  injected with 1:3 depomedrol (40mg ): 1% lidocaine inferior laterally into knee. the patient tolerated the procedure well. Ice 24-48h, heat thereafter as needed instructions aftercare provided.     Assessment & Plan:   B great toe numbness - suspect peripheral neuropathy, likely DM Check a1c and TH+B12 now Start gabapentin qhs nd follow up PCP for titration on same -   L knee osteoarthritis - mild OA flare with swelling on exam and pain - no recent injury Repeat steroid injection as performed by ortho Fall 2011 with 4 mo relief - pt to follow up ortho if worse or unimporved

## 2011-10-05 NOTE — Assessment & Plan Note (Signed)
Poor control - suspect neuropathy contributing to toe numbness symptoms check a1c Continue Lantus titration as per PCP, pt reports taking same with Januvia and metformin Lab Results  Component Value Date   HGBA1C 8.5* 04/20/2011

## 2011-10-06 LAB — VITAMIN B12: Vitamin B-12: 484 pg/mL (ref 211–911)

## 2011-10-06 LAB — TSH: TSH: 1.75 u[IU]/mL (ref 0.35–5.50)

## 2011-10-07 ENCOUNTER — Other Ambulatory Visit: Payer: Self-pay | Admitting: Internal Medicine

## 2011-10-20 ENCOUNTER — Ambulatory Visit (INDEPENDENT_AMBULATORY_CARE_PROVIDER_SITE_OTHER): Payer: Medicare HMO | Admitting: Internal Medicine

## 2011-10-20 ENCOUNTER — Encounter: Payer: Self-pay | Admitting: Internal Medicine

## 2011-10-20 VITALS — BP 150/88 | HR 86 | Temp 98.5°F | Resp 16 | Wt 225.0 lb

## 2011-10-20 DIAGNOSIS — E119 Type 2 diabetes mellitus without complications: Secondary | ICD-10-CM

## 2011-10-20 DIAGNOSIS — M25569 Pain in unspecified knee: Secondary | ICD-10-CM

## 2011-10-20 MED ORDER — HYDROCODONE-ACETAMINOPHEN 7.5-500 MG PO TABS: 1.0000 | ORAL_TABLET | Freq: Three times a day (TID) | ORAL | Status: DC | PRN

## 2011-10-20 NOTE — Patient Instructions (Addendum)
Diabetes/lantus titration: this is an every 3 day cycle. You check your CBG fasting every morning. If the blood sugar is 150+ 3 mornings in a row increase the lantus by 5 units. Continue this cycle until you are controlled or you get to 100 units of lantus. Continue the other oral medications: metform and [place].  IT IS EXTREMELY IMPORTANT TO GET YOU BLOOD SUGAR UNDER CONTROL: keep you eye, kidneys and legs.   Knee pain - osteoarthritis of the left knee. In '11 there was not much space in the joint and there may be less now.  Plan - referral to Dr. Melene Muller taking ibuprofen 400 mg ( 2 tablets) three times a day and may increase to 600 mg or even 800 mg three times day. Watch for gastric irritation or change in stools.

## 2011-10-20 NOTE — Progress Notes (Signed)
  Subjective:    Patient ID: Cynthia Singh, female    DOB: June 10, 1940, 71 y.o.   MRN: [id number]  HPI Mr.s Cynthia Singh presents for follow-up. She had an A1C 9.4% in May despite being on lantus and januvia and metformin. Discussed the importance of control.   She has been having severe knee pain. She has been taking a low dose of ibuprofen. Reviewed films from '11 - tricompartmental disease rated at moderate on the left.   Past Medical History  Diagnosis Date  . Osteoarthritis of spine   . Hyperlipemia   . Hypertension   . Diabetes mellitus, type 2   . Depressive disorder    Past Surgical History  Procedure Date  . Gallbladder surgery 1968  . Appendectomy 1959  . Combined hysterectomy abdominal w/ a&p repair / oophorectomy 1968  . Multiple d&c   . Tonsillectomy age 41   Family History  Problem Relation Age of Onset  . Cancer Brother     Colon  . Coronary artery disease Neg Hx   . Heart attack Neg Hx    History   Social History  . Marital Status: Married    Spouse Name: N/A    Number of Children: N/A  . Years of Education: N/A   Occupational History  . Not on file.   Social History Main Topics  . Smoking status: Never Smoker   . Smokeless tobacco: Not on file  . Alcohol Use: No  . Drug Use: No  . Sexually Active: Not on file   Other Topics Concern  . 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     Objective:   Physical Exam Filed Vitals:   10/20/11 1318  BP: 150/88  Pulse: 86  Temp: 98.5 F (36.9 C)  Resp: 16   Wt Readings from Last 3 Encounters:  10/20/11 225 lb (102.059 kg)  10/05/11 227 lb 12.8 oz (103.329 kg)  08/05/11 224 lb 4 oz (101.719 kg)   Gen'l-  overweight white woman in no distress HEENT- C&S clear Cor - RRR Pulm - normal respirations MSK- knee with normal ROM, no crepitus       Assessment & Plan:

## 2011-10-22 NOTE — Assessment & Plan Note (Signed)
Out of control with A1C 9.4%.  Plan -  titrate Lantus on a 3 day cycle. Based on body weight she may get to as much as 100 units per day.  Continue other medications.   Continue with Diabetic diet.

## 2011-10-22 NOTE — Assessment & Plan Note (Signed)
DJD  Left knee most likely progressive.  Plan Increase NSAIDs no higher therapeutic dose - GI precautions given  Refer to orthopedics

## 2011-11-03 ENCOUNTER — Other Ambulatory Visit: Payer: Self-pay | Admitting: *Deleted

## 2011-11-03 NOTE — Telephone Encounter (Signed)
Patient called upset that only quantity of her pain med called to [facility] was 10. Called pharmacy and spoke to pharmacist at [facility] to change quanity of lortab to 90 with 3 refills per verbal order Dr, Debby Bud. Patient notified of this.

## 2011-12-30 ENCOUNTER — Telehealth: Payer: Self-pay | Admitting: Internal Medicine

## 2011-12-30 NOTE — Telephone Encounter (Signed)
The pt called the triage line asking about a referral for knee surgery.  The pt was called back and then transferred to [facility]. Thanks!

## 2011-12-30 NOTE — Telephone Encounter (Signed)
Pt needs a surgical clearance for knee surgery.  She has an appt. On Aug 27 (1st available 30 min slot).  [facility] office Arlys John [place], [place]) wants to know if she can be worked in sooner per the patient.

## 2011-12-30 NOTE — Telephone Encounter (Signed)
Last A1C 9.4% in May 20, '13. She can be worked in sooner.  She will need labs in advance - A1C 250.03, metabolic panel 250.03.   She is a high risk for surgical complications and healing issues if her A1C is elevated.

## 2012-01-06 ENCOUNTER — Encounter: Payer: Self-pay | Admitting: Internal Medicine

## 2012-01-06 ENCOUNTER — Other Ambulatory Visit (INDEPENDENT_AMBULATORY_CARE_PROVIDER_SITE_OTHER): Payer: Medicare HMO

## 2012-01-06 ENCOUNTER — Ambulatory Visit (INDEPENDENT_AMBULATORY_CARE_PROVIDER_SITE_OTHER): Payer: Medicare HMO | Admitting: Internal Medicine

## 2012-01-06 VITALS — BP 142/80 | HR 85 | Temp 98.1°F | Resp 16 | Wt 222.0 lb

## 2012-01-06 DIAGNOSIS — E119 Type 2 diabetes mellitus without complications: Secondary | ICD-10-CM

## 2012-01-06 DIAGNOSIS — I10 Essential (primary) hypertension: Secondary | ICD-10-CM

## 2012-01-06 DIAGNOSIS — E785 Hyperlipidemia, unspecified: Secondary | ICD-10-CM

## 2012-01-06 DIAGNOSIS — Z136 Encounter for screening for cardiovascular disorders: Secondary | ICD-10-CM

## 2012-01-06 MED ORDER — INSULIN GLARGINE 100 UNIT/ML ~~LOC~~ SOLN: 50.0000 [IU] | Freq: Every day | SUBCUTANEOUS | Status: DC

## 2012-01-06 MED ORDER — HYDROCODONE-ACETAMINOPHEN 7.5-325 MG PO TABS: 2.0000 | ORAL_TABLET | Freq: Four times a day (QID) | ORAL | Status: AC | PRN

## 2012-01-07 LAB — LIPID PANEL
Cholesterol: 268 mg/dL — ABNORMAL HIGH (ref 0–200)
HDL: 38.3 mg/dL — ABNORMAL LOW (ref >39.00)
Total CHOL/HDL Ratio: 7
Triglycerides: 1009 mg/dL — ABNORMAL HIGH (ref 0.0–149.0)
VLDL: 201.8 mg/dL — ABNORMAL HIGH (ref 0.0–40.0)

## 2012-01-07 LAB — COMPREHENSIVE METABOLIC PANEL
ALT: 21 U/L (ref 0–35)
AST: 19 U/L (ref 0–37)
Albumin: 4.3 g/dL (ref 3.5–5.2)
Alkaline Phosphatase: 81 U/L (ref 39–117)
BUN: 20 mg/dL (ref 6–23)
CO2: 29 mEq/L (ref 19–32)
Calcium: 9.6 mg/dL (ref 8.4–10.5)
Chloride: 96 mEq/L (ref 96–112)
Creatinine, Ser: 0.8 mg/dL (ref 0.4–1.2)
GFR: 80.28 mL/min (ref >60.00)
Glucose, Bld: 277 mg/dL — ABNORMAL HIGH (ref 70–99)
Potassium: 4.4 mEq/L (ref 3.5–5.1)
Sodium: 132 mEq/L — ABNORMAL LOW (ref 135–145)
Total Bilirubin: 0.4 mg/dL (ref 0.3–1.2)
Total Protein: 7.7 g/dL (ref 6.0–8.3)

## 2012-01-07 LAB — HEPATIC FUNCTION PANEL
ALT: 21 U/L (ref 0–35)
AST: 19 U/L (ref 0–37)
Albumin: 4.3 g/dL (ref 3.5–5.2)
Alkaline Phosphatase: 81 U/L (ref 39–117)
Bilirubin, Direct: 0 mg/dL (ref 0.0–0.3)
Total Bilirubin: 0.4 mg/dL (ref 0.3–1.2)
Total Protein: 7.7 g/dL (ref 6.0–8.3)

## 2012-01-07 LAB — HEMOGLOBIN A1C: Hgb A1c MFr Bld: 11.2 % — ABNORMAL HIGH (ref 4.6–6.5)

## 2012-01-07 LAB — LDL CHOLESTEROL, DIRECT: Direct LDL: 98.7 mg/dL

## 2012-01-08 ENCOUNTER — Telehealth: Payer: Self-pay | Admitting: Internal Medicine

## 2012-01-08 ENCOUNTER — Telehealth: Payer: Self-pay | Admitting: *Deleted

## 2012-01-08 NOTE — Telephone Encounter (Signed)
Please call with authorization - pt just seen and pain medication increased due to increased knee pain and delay in TKR

## 2012-01-08 NOTE — Telephone Encounter (Signed)
Caller: Brittany/Patient; Phone: [phone]; Reason for Call: Please call [facility] re Rx for Hydrocodone; last filled 8/14 for 30 day supply.  Directions have changed but pt should have enough to last until Monday; pt is waiting at pharmacy.

## 2012-01-08 NOTE — Telephone Encounter (Signed)
Caller: Kasara/Patient; Phone: [phone]; Reason for Call: Please call pt re refill on Hydrocodone; pt advises MD increased dosage due to knee pain but pharmacy states is too early to refill.

## 2012-01-08 NOTE — Telephone Encounter (Signed)
Pharmacy (April) states that pt picked up Rx for for hydrocodone 7.5/500 1 po TID  and is requesting to fill new Rx written 08/21 for hydrocodone 7.5/325 2 tablets by mouth 6 times daily. Pharmacy is requesting verbal authorization and diagnosis for increase medication, please advise. They are aware MD may not respond until 08/26.

## 2012-01-08 NOTE — Telephone Encounter (Signed)
Pharmacy notified of approval per Dr.Norins to refill hydrocodone written on 01/06/2012 for knee pain

## 2012-01-09 MED ORDER — INSULIN ASPART 100 UNIT/ML ~~LOC~~ SOLN: 10.0000 [IU] | Freq: Three times a day (TID) | SUBCUTANEOUS | Status: DC

## 2012-01-09 NOTE — Assessment & Plan Note (Signed)
Lab Results  Component Value Date   HGBA1C 11.2* 01/06/2012   Way out of control. Her elevated blood sugar is a definite risk for complications of surgery and she should postpone surgery until under better control. At this time she is using basal insulin therapy metformin and Januvia (DPP4).  Plan  Continue basal insulin. Her AM fasting blood sugars should be consistently under 150  Continue metformin  Stop Januvia and start before meal insulin - novolog flex pen at 10 units before meals  Strict dietary adherence.

## 2012-01-09 NOTE — Progress Notes (Signed)
Subjective:    Patient ID: Cynthia Singh, female    DOB: 11-07-1940, 71 y.o.   MRN: [id number]  HPI Mrs. Coggins presents for medical clearance of right TKR. Her pain has become unbearable and she is will to move forward with surgery. Her biggest surgical risk is her poorly controlled diabetes. She is advised that in order to maximize healing and minimize risk for infection and complications here blood sugar needs to be well controlled. Fortunately she has no cardiac history that would complicate surgery.   Past Medical History  Diagnosis Date  . Osteoarthritis of spine   . Hyperlipemia   . Hypertension   . Diabetes mellitus, type 2   . Depressive disorder    Past Surgical History  Procedure Date  . Gallbladder surgery 1968  . Appendectomy 1959  . Combined hysterectomy abdominal w/ a&p repair / oophorectomy 1968  . Multiple d&c   . Tonsillectomy age 31   Family History  Problem Relation Age of Onset  . Cancer Brother     Colon  . Coronary artery disease Neg Hx   . Heart attack Neg Hx    History   Social History  . Marital Status: Married    Spouse Name: N/A    Number of Children: N/A  . Years of Education: N/A   Occupational History  . Not on file.   Social History Main Topics  . Smoking status: Never Smoker   . Smokeless tobacco: Not on file  . Alcohol Use: No  . Drug Use: No  . Sexually Active: Not on file   Other Topics Concern  . 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 amitriptyline (ELAVIL) 150 MG tablet Take 1 tablet (150 mg total) by mouth at bedtime.  30 tablet  11  . Cholecalciferol (VITAMIN D3) 1000 UNITS CAPS Take by mouth daily.        . ferrous gluconate (FERGON) 325 MG tablet Take 325 mg by mouth daily with breakfast.         . gabapentin (NEURONTIN) 300 MG capsule Take 1 capsule (300 mg total) by mouth at bedtime.  30 capsule  2  . Garlic Oil 500 MG CAPS Take by mouth daily.        . insulin glargine (LANTUS SOLOSTAR) 100 UNIT/ML injection Inject 50 Units into the skin at bedtime. titrate up the dose as instructed  5 pen  PRN  . Insulin Pen Needle 31G X 8 MM MISC 1 Device by Does not apply route daily.  30 each  11  . meclizine (ANTIVERT) 25 MG tablet Take 1 tablet (25 mg total) by mouth 3 (three) times daily as needed for dizziness or nausea.  30 tablet  1  . metFORMIN (GLUMETZA) 1000 MG (MOD) 24 hr tablet Take 1,000 mg by mouth 2 (two) times daily with a meal.        . olmesartan (BENICAR) 40 MG tablet Take 1 tablet (40 mg total) by mouth daily.  90 tablet  1  . rosuvastatin (CRESTOR) 10 MG tablet Take 10 mg by mouth daily.      . sitaGLIPtan (JANUVIA) 100 MG tablet Take 100 mg by mouth daily.              Review of Systems System  review is negative for any constitutional, cardiac, pulmonary, GI or neuro symptoms or complaints other than as described in the HPI.     Objective:   Physical Exam Filed Vitals:   01/06/12 1600  BP: 142/80  Pulse: 85  Temp: 98.1 F (36.7 C)  Resp: 16   Wt Readings from Last 3 Encounters:  01/06/12 222 lb (100.699 kg)  10/20/11 225 lb (102.059 kg)  10/05/11 227 lb 12.8 oz (103.329 kg)   Gen'l- an overweight white woman in no acute distress HEENT- C&S clear Cor - RRR Pulm - normal respirations Neuro - A&O x 3, CN II-XII grossly normal.       Assessment & Plan:

## 2012-01-09 NOTE — Assessment & Plan Note (Signed)
BP Readings from Last 3 Encounters:  01/06/12 142/80  10/20/11 150/88  10/05/11 122/82   Adequate control but suboptimal.  Plan Continue present medications  Work on weight loss - will benefit her TKR too.

## 2012-01-11 ENCOUNTER — Encounter: Payer: Self-pay | Admitting: Internal Medicine

## 2012-01-12 ENCOUNTER — Ambulatory Visit: Payer: Medicare HMO | Admitting: Internal Medicine

## 2012-01-27 ENCOUNTER — Ambulatory Visit (INDEPENDENT_AMBULATORY_CARE_PROVIDER_SITE_OTHER): Payer: Medicare HMO | Admitting: Internal Medicine

## 2012-01-27 VITALS — BP 160/80 | HR 93 | Temp 97.6°F | Resp 20 | Wt 227.0 lb

## 2012-01-27 DIAGNOSIS — I10 Essential (primary) hypertension: Secondary | ICD-10-CM

## 2012-01-27 DIAGNOSIS — M25569 Pain in unspecified knee: Secondary | ICD-10-CM

## 2012-01-27 DIAGNOSIS — E119 Type 2 diabetes mellitus without complications: Secondary | ICD-10-CM

## 2012-01-27 DIAGNOSIS — M25561 Pain in right knee: Secondary | ICD-10-CM

## 2012-01-27 MED ORDER — OXYCODONE HCL 20 MG PO TB12: 20.0000 mg | ORAL_TABLET | Freq: Two times a day (BID) | ORAL | Status: DC

## 2012-01-27 NOTE — Progress Notes (Signed)
Subjective:     Patient ID: Cynthia Singh, female   DOB: Sep 15, 1940, 71 y.o.   MRN: [id number]  HPI Comments: Cynthia Singh is a 71 yo female with a history of uncontrolled DM2 and osteoarthritis who has come to clinic today for left knee pain and elevated blood sugars. Her knee pain has been present for several months and is impairing her ability to walk. She states that she feels unsteady when she walks. She also states that she has a limited ROM at her left knee. She states that she cannot have anymore steroid shots this year. She had attempted to have a TKR in her left knee, but was unable to undergo surgery because of her elevated blood sugars. She stated that she has been taking her Lantus insulin as prescribed, but has never taken the Aspartate. She takes all other medications as prescribed. At meals she drinks diet Dr.Pepper and rarely drinks regular soda. She does not drink sweet tea. She eats plenty of fruits, vegetables, and fiber in her diet. She states that she has numbness in her feet. She also reported that she occasionally has discolored urine that is dark.  Past Medical History  Diagnosis Date  . Osteoarthritis of spine   . Hyperlipemia   . Hypertension   . Diabetes mellitus, type 2   . Depressive disorder    Past Surgical History  Procedure Date  . Gallbladder surgery 1968  . Appendectomy 1959  . Combined hysterectomy abdominal w/ a&p repair / oophorectomy 1968  . Multiple d&c   . Tonsillectomy age 71   Family History  Problem Relation Age of Onset  . Cancer Brother     Colon  . Coronary artery disease Neg Hx   . Heart attack Neg Hx    History   Social History  . Marital Status: Married    Spouse Name: N/A    Number of Children: N/A  . Years of Education: N/A   Occupational History  . Not on file.   Social History Main Topics  . Smoking status: Never Smoker   . Smokeless tobacco: Not on file  . Alcohol Use: No  . Drug Use: No  . Sexually Active: Not on  file   Other Topics Concern  . 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 amitriptyline (ELAVIL) 150 MG tablet Take 1 tablet (150 mg total) by mouth at bedtime.  30 tablet  11  . Cholecalciferol (VITAMIN D3) 1000 UNITS CAPS Take by mouth daily.        . ferrous gluconate (FERGON) 325 MG tablet Take 325 mg by mouth daily with breakfast.        . gabapentin (NEURONTIN) 300 MG capsule Take 1 capsule (300 mg total) by mouth at bedtime.  30 capsule  2  . Garlic Oil 500 MG CAPS Take by mouth daily.        . insulin aspart (NOVOLOG) 100 UNIT/ML injection Inject 10 Units into the skin 3 (three) times daily before meals.  3 pen  PRN  . insulin glargine (LANTUS SOLOSTAR) 100 UNIT/ML injection Inject 50 Units into the skin at bedtime. titrate up the dose as instructed  5 pen  PRN  . Insulin Pen Needle 31G X 8 MM MISC 1 Device by Does not  apply route daily.  30 each  11  . meclizine (ANTIVERT) 25 MG tablet Take 1 tablet (25 mg total) by mouth 3 (three) times daily as needed for dizziness or nausea.  30 tablet  1  . metFORMIN (GLUMETZA) 1000 MG (MOD) 24 hr tablet Take 1,000 mg by mouth 2 (two) times daily with a meal.        . olmesartan (BENICAR) 40 MG tablet Take 1 tablet (40 mg total) by mouth daily.  90 tablet  1  . rosuvastatin (CRESTOR) 10 MG tablet Take 10 mg by mouth daily.         Review of Systems  All other systems reviewed and are negative.       Objective:   Physical Exam  Constitutional: No distress.       Obese  Eyes: Pupils are equal, round, and reactive to light. Right eye exhibits no discharge. Left eye exhibits no discharge. No scleral icterus.  Cardiovascular: Normal rate and regular rhythm.  Exam reveals no gallop and no friction rub.   No murmur  heard.      2+ radial pulses bl. 1+ dorsal pedalis pulse on right. Unable to palpate dorsal pedalis pulse on left. 1+ Popliteal pulse on left. No carotid bruits.  Pulmonary/Chest: Effort normal and breath sounds normal. No respiratory distress. She has no wheezes. She has no rales.  Abdominal: Soft. Bowel sounds are normal. She exhibits no distension and no mass. There is tenderness (Tender to deep palpation in right upper quadrant and left upper quadrant.). There is no guarding.       Lower liver boarder at right costal margin.  Musculoskeletal:       ROM limited in left knee from pain. Moderate crepitus in left knee.  Skin: She is not diaphoretic.   Filed Vitals:   01/27/12 1604  BP: 160/80  Pulse: 93  Temp: 97.6 F (36.4 C)  Resp: 20        Assessment & Plan:     1. Left Knee Pain - [facility] alleviates her pain, but does not last long enough. She needs longer acting pain management until she is cleared for TKR surgery. Blood sugar needs to be better controled to have TKR.  PLAN - Start Oxycodone ER and use Hydrocodone for breakthrough pain. 2. Elevated Blood Sugars - Patient requires better blood sugar control. Her diet minimizes added sugars. She is on metformin and Lantus insulin which is not providing adequate control.   PLAN - She was counseled on the benefits of replacing simple carbs with complex carbs. Added Aspart insulin before meals (ordered at last visit but never started use). Briefed patient on use of insulins and blood sugar monitoring at home. 3. Abdominal Pain - Location and character of the pain in the setting of elevated triglycerides suggests acute pancreatitis. Patient's last triglyceride level was 1009.  PLAN - Advise patient to reduce food intake and begin gemfibrozil to lower her triglycerides. Cautioned patient to adjust insulin with reduced food intake.    Attending note: agree with assessment and plan per Mr. Kai Levins, MSI III. Personally examined patient -  notable for tenderness at the epigastrium with radiation of pain to spine; right knee with enlargement, crepitus and tenderness.  Discussed insulin management with titration of Levemir and instructed in the use of before meal insulin. Reviewed pain management and the advantage of long acting medications for control and to reduce overall total daily dosage of medication.

## 2012-01-27 NOTE — Patient Instructions (Addendum)
1. Diabetes - still need to work to get better control. Plan Stop Januvia, continue metformin  Continue no sugar, low carb diet.  Basal insulin therapy -Lantus. Continue 50 units at bedtime. 3 day titration schedule: check your fasting AM  Blood sugar. If it is 150+ 3 days in a row you will increase the lantus by 3 units. For example - sugar high for 3 days increase lantus to 53 units. Sugar high next 3 days increase lantus to 56 units. Etc. Stop at 60 units and call me or come in.  Before meal insulin - Novolog pen. Start at 6 units before breakfast and lunch, 8 units before supper. If you are not going to eat don't take the insulin.  2. Pain control - for better control and less medication start taking oxycodone ER 20 mg every 12 hours. For breakthrough pain you may take hydrocodone 7.5 mg every 4 hours. We can adjust the long acting oxycodone based on how much breakthrough medication you need. These are all addicting drugs and we will need to be cautious.  3. Pancreatitis - the tenderness across the top of your belly is consistent with pancreatitis. In your case this is most likely caused by elevated triglycerides. Plan - if you are having a lot of pain, not controlled by the pain medications you need to cut way back on food intake, rest the pancrease. (Remember to not take before meal insulin if you are not eating).  To fix the problem of high triglycerides -a) low fat diet b) continue crestor c) start gemfibrizil 600 mg: take 1/2 tab once a day x 2 days, take 1/2 tab AM/PM x 2 days, take 1 tab AM and 1/2 tab PM x 2 days then 1 tab twice a day. Watch for diffuse muscle pain.  Acute Pancreatitis The pancreas is a large gland located behind your stomach. It produces (secretes) enzymes. These enzymes help digest food. It also releases the hormones glucagon and insulin. These hormones help regulate blood sugar. When the pancreas becomes inflamed, the disease is called pancreatitis. Inflammation of the  pancreas occurs when enzymes from the pancreas begin attacking and digesting the pancreas. CAUSES   Most cases ofsudden onset (acute) pancreatitis are caused by:  Alcohol abuse.   Gallstones.  Other less common causes are:  Some medications.   Exposure to certain chemicals   Infection.   Damage caused by an accident (trauma).   Surgery of the belly (abdomen).  SYMPTOMS   Acute pancreatitis usually begins with pain in the upper abdomen and may radiate to the back. This pain may last a couple days. The constant pain varies from mild to severe. The acute form of this disease may vary from mild, nonspecific abdominal pain to profound shock with coma. About 1 in 5 cases are severe. These patients become dehydrated and develop low blood pressure. In severe cases, bleeding into the pancreas can lead to shock and death. The lungs, heart, and kidneys may fail. DIAGNOSIS   Your caregiver will form a clinical opinion after giving you an exam. Laboratory work is used to confirm this diagnosis. Often,a digestive enzyme from the pancreas (serum amylase) and other enzymes are elevated. Sugars and fats (lipids) in the blood may be elevated. There may also be changes in the following levels: calcium, magnesium, potassium, chloride and bicarbonate (chemicals in the blood). X-rays, a CT scan, or ultrasound of your abdomen may be necessary to search for other causes of your abdominal pain. TREATMENT  Most pancreatitis requires treatment of symptoms. Most acute attacks last a couple of days. Your caregiver can discuss the treatment options with you.  If complications occur, hospitalization may be necessary for pain control and intravenous (IV) fluid replacement.   Sometimes, a tube may be put into the stomach to control vomiting.   Food may not be allowed for 3 to 4 days. This gives the pancreas time to rest. Giving the pancreas a rest means there is no stimulation that would produce more enzymes and cause  more damage.   Medicines (antibiotics) that kill germs may be given if infection is the cause.   Sometimes, surgery may be required.   Following an acute attack, your caregiver will determine the cause, if possible, and offer suggestions to prevent recurrences.  HOME CARE INSTRUCTIONS    Eat smaller, more frequent meals. This reduces the amount of digestive juices the pancreas produces.   Decrease the amount of fat in your diet. This may help reduce loose, diarrheal stools.   Drink enough water and fluids to keep your urine clear or pale yellow. This is to avoid dehydration which can cause increased pain.   Talk to your caregiver about pain relievers or other medicines that may help.   Avoid anything that may have triggered your pancreatitis (for example, alcohol).   Follow the diet advised by your caregiver. Do not advance the diet too soon.   Take medicines as prescribed.   Get plenty of rest.   Check your blood sugar at home as directed by your caregiver.   If your caregiver has given you a follow-up appointment, it is very important to keep that appointment. Not keeping the appointment could result in a lasting (chronic) or permanent injury, pain, and disability. If there is any problem keeping the appointment, you must call to reschedule.  SEEK MEDICAL CARE IF:    You are not recovering in the time described by your caregiver.   You have persistent pain, weakness, or feel sick to your stomach (nauseous).   You have recovered and then have another bout of pain.  SEEK IMMEDIATE MEDICAL CARE IF:    You are unable to eat or keep fluids down.   Your pain increases a lot or changes.   You have an oral temperature above 102 F (38.9 C), not controlled by medicine.   Your skin or the white part of your eyes look yellow (jaundice).   You develop vomiting.   You feel dizzy or faint.   Your blood sugar is high (over 300).  MAKE SURE YOU:    Understand these  instructions.   Will watch your condition.   Will get help right away if you are not doing well or get worse.  Document Released: 05/04/2005 Document Revised: 04/23/2011 Document Reviewed: 12/16/2007 [facility] Patient Information 2012 [place], [place].

## 2012-01-30 NOTE — Assessment & Plan Note (Signed)
BP Readings from Last 3 Encounters:  01/27/12 160/80  01/06/12 142/80  10/20/11 150/88   Suboptimal control. She reports better readings at home  Plan- keep record of home BP readings for review.

## 2012-01-30 NOTE — Assessment & Plan Note (Signed)
Lab Results  Component Value Date   HGBA1C 11.2* 01/06/2012   Continued VERY poor control.  Plan Emphasized the importance of diet  Full insulin regimen: levemit qHS, Novolog before meals on fixed dose starting at 6,6,8, continue metformin  Soon ROV

## 2012-01-30 NOTE — Assessment & Plan Note (Signed)
ADvanced DJD right knee and for TKR when diabetes is better controlled. She is made aware of the increased risk of poor wound healing and infection if she has prosthetic surgery while poorly controlled.  Plan - for pain management: Oxy ER 20 mg q12, continue percocet for breakthrough pain  Soon OV for assessment and adjustment of medication.

## 2012-02-01 ENCOUNTER — Telehealth: Payer: Self-pay | Admitting: Internal Medicine

## 2012-02-01 NOTE — Telephone Encounter (Signed)
The patient called and is hoping to get a replacement for her oxycotin rx.  She states it was too expensive when she went to pick it up. Thanks!

## 2012-02-01 NOTE — Telephone Encounter (Signed)
Pt called back.  The oxycodone 20 mg is $350 for twice a day. Insurance will not cover it.  She will take the oxycodone 10/650 4 times a day if it is cheaper.

## 2012-02-01 NOTE — Telephone Encounter (Signed)
Caller: Sibbie/Patient; Phone: [phone]; Reason for Call: Patient calling about a refill on her Oxycodone, states she can not afford the medication and wants to know if Dr.  Debby Bud could prescribe something different.  Please call her back.  Thanks

## 2012-02-01 NOTE — Telephone Encounter (Signed)
There are no inexpensive extended release narcotic products. Does she bear full cost for the oxycodone ER or is it an affordable co-pay? If she bears full cost can switch her to oxycodone 10/650 4 times a day.

## 2012-02-02 ENCOUNTER — Telehealth: Payer: Self-pay | Admitting: *Deleted

## 2012-02-02 MED ORDER — OXYCODONE-ACETAMINOPHEN 10-650 MG PO TABS: 1.0000 | ORAL_TABLET | Freq: Four times a day (QID) | ORAL | Status: DC | PRN

## 2012-02-02 NOTE — Telephone Encounter (Signed)
Try to check on the price for her and if it is affordable ok to prepare Rx.

## 2012-02-02 NOTE — Telephone Encounter (Signed)
Daughter notified of Rx for oxycodone 10/650 is #45.00 copay with insurance . Agreeable with daughter. Rx printed and to be picked up by daughter.

## 2012-02-05 ENCOUNTER — Telehealth: Payer: Self-pay | Admitting: Internal Medicine

## 2012-02-05 NOTE — Telephone Encounter (Signed)
Caller: [name]/Patient; Phone: [phone]; Reason for Call: [name] with [facility] [phone] calling about patient Cynthia Singh 19-Aug-1940.  Patient requesting a refill on hydrocodone, per Pharmacy patient had percocet filled on 02/02/12.  She told the pharmacy that Dr.  Debby Bud wanted her to rotate the percocet and hydrocodone.  Pharmacy wants to know if this is correct.  Please call [name] back.  Thanks

## 2012-02-05 NOTE — Telephone Encounter (Signed)
At last office visit discussed long term pain mgt and extended release

## 2012-02-05 NOTE — Telephone Encounter (Signed)
Continuation of note: oxycontin which was to expensive. The alternative suggested would be to take oxycodone (percocet) on a regular schedule. I do not recommend mixing or alternating with hydrocodone. IF taking percocet on schedule is not working we can 1. Start fentanyl patch 50 mcg q 72 -(generic) or if that is too expensive an office visit to discuss other alternatives.

## 2012-02-09 NOTE — Telephone Encounter (Signed)
Pharmacy notified of Dr. Debby Bud response of pain medication for patient. Pharmacy will let patient know of medication fentanyl patch or to follow up with Dr, Debby Bud.

## 2012-02-15 ENCOUNTER — Encounter: Payer: Self-pay | Admitting: Internal Medicine

## 2012-02-15 ENCOUNTER — Ambulatory Visit (INDEPENDENT_AMBULATORY_CARE_PROVIDER_SITE_OTHER): Payer: Medicare HMO | Admitting: Internal Medicine

## 2012-02-15 VITALS — BP 152/86 | HR 93 | Temp 97.5°F | Resp 16 | Wt 226.0 lb

## 2012-02-15 DIAGNOSIS — M25569 Pain in unspecified knee: Secondary | ICD-10-CM

## 2012-02-15 DIAGNOSIS — Z23 Encounter for immunization: Secondary | ICD-10-CM

## 2012-02-15 DIAGNOSIS — E119 Type 2 diabetes mellitus without complications: Secondary | ICD-10-CM

## 2012-02-15 MED ORDER — OXYCODONE-ACETAMINOPHEN 10-325 MG PO TABS: 2.0000 | ORAL_TABLET | Freq: Four times a day (QID) | ORAL | Status: DC | PRN

## 2012-02-15 NOTE — Patient Instructions (Addendum)
Diabetes - Poor control on present regimen.  Plan Increase lantus to 65 units at bedtime  Increase Novolog to 8 before bkfst, 8 before lunch, 10 before supper  Check blood sugar every AM fasting and on alternating days check blood sugar 90 minutes after each meal.  Knee pain - new RX for percocet 10/325  Two tablets 4 times a day.

## 2012-02-15 NOTE — Assessment & Plan Note (Signed)
Poor control on present regimen.  Plan Increase lantus to 65 units at bedtime  Increase Novolog to 8 before bkfst, 8 before lunch, 10 before supper  Check blood sugar every AM fasting and on alternating days check blood sugar 90 minutes after each meal.

## 2012-02-15 NOTE — Progress Notes (Signed)
  Subjective:    Patient ID: Cynthia Singh, female    DOB: [birth year], 71 y.o.   MRN: [id number]  HPI Cynthia Singh presents for follow-up on diabetes management and pain control from a end-stage knee.  She reports that she is now taking Lantus 58 u qHS and Novolog 6,6,8 and her blood sugars are very high.  She has continued to have knee pain. She could not afford oxycontin and is taking percocet 10/650 two tablets every 6 hours which does control her pain. She denies somnolence or constipation.  PMH, FamHx and SocHx reviewed for any changes and relevance.  Current Outpatient Prescriptions on File Prior to Visit  Medication Sig Dispense Refill  . amitriptyline (ELAVIL) 150 MG tablet Take 1 tablet (150 mg total) by mouth at bedtime.  30 tablet  11  . Cholecalciferol (VITAMIN D3) 1000 UNITS CAPS Take by mouth daily.        . ferrous gluconate (FERGON) 325 MG tablet Take 325 mg by mouth daily with breakfast.        . gabapentin (NEURONTIN) 300 MG capsule Take 1 capsule (300 mg total) by mouth at bedtime.  30 capsule  2  . Garlic Oil 500 MG CAPS Take by mouth daily.        . insulin aspart (NOVOLOG) 100 UNIT/ML injection Inject 10 Units into the skin 3 (three) times daily before meals.  3 pen  PRN  . insulin glargine (LANTUS SOLOSTAR) 100 UNIT/ML injection Inject 50 Units into the skin at bedtime. titrate up the dose as instructed  5 pen  PRN  . Insulin Pen Needle 31G X 8 MM MISC 1 Device by Does not apply route daily.  30 each  11  . meclizine (ANTIVERT) 25 MG tablet Take 1 tablet (25 mg total) by mouth 3 (three) times daily as needed for dizziness or nausea.  30 tablet  1  . metFORMIN (GLUMETZA) 1000 MG (MOD) 24 hr tablet Take 1,000 mg by mouth 2 (two) times daily with a meal.        . olmesartan (BENICAR) 40 MG tablet Take 1 tablet (40 mg total) by mouth daily.  90 tablet  1  . oxyCODONE (OXYCONTIN) 20 MG 12 hr tablet Take 1 tablet (20 mg total) by mouth every 12 (twelve) hours.  60 tablet  0   . oxyCODONE-acetaminophen (PERCOCET) 10-650 MG per tablet Take 1 tablet by mouth 4 (four) times daily as needed. As  Needed forpain  120 tablet  0  . rosuvastatin (CRESTOR) 10 MG tablet Take 10 mg by mouth daily.          Review of Systems System review is negative for any constitutional, cardiac, pulmonary, GI or neuro symptoms or complaints other than as described in the HPI.     Objective:   Physical Exam Filed Vitals:   02/15/12 1458  BP: 152/86  Pulse: 93  Temp: 97.5 F (36.4 C)  Resp: 16   Gen'l- WNWD white woman who is emotionally fragile HEENT- C&S clear Cor- RRR Pulm - normal respirations.        Assessment & Plan:

## 2012-02-15 NOTE — Assessment & Plan Note (Signed)
Still having marked pain. She is not ready for surgery due to poorly controlled DM  Plan - change pain medication to Oxycodone/APAP 10/325 two tabs qid

## 2012-03-11 ENCOUNTER — Telehealth: Payer: Self-pay | Admitting: Internal Medicine

## 2012-03-11 MED ORDER — OXYCODONE-ACETAMINOPHEN 10-325 MG PO TABS: 2.0000 | ORAL_TABLET | Freq: Four times a day (QID) | ORAL | Status: DC | PRN

## 2012-03-11 NOTE — Telephone Encounter (Signed)
Rx printed and to be signed by Dr.Norins.

## 2012-03-11 NOTE — Telephone Encounter (Signed)
Caller: Stephie/Patient; Patient Name: Cynthia Singh; PCP: Illene Regulus; Best Callback Phone Number: [phone]. patient states she was told by Dr. Debby Bud that she did not need to come to the office for a pain prescription. She was told to go on the computer and request it. Her computer is broken. She is requesting a refill for Percocet 10mg .  She will be out of medication on  Wednesday , March 16, 2012. LOV 02/15/12.  Encouraged to contact her pharmacy for a refill request to be sent to the office. Advised I would forward request as well. Patient expressed undestadning. MEDICATION REFILL REQUEST ONLY

## 2012-03-14 ENCOUNTER — Other Ambulatory Visit: Payer: Self-pay | Admitting: *Deleted

## 2012-03-14 MED ORDER — OXYCODONE-ACETAMINOPHEN 10-325 MG PO TABS: 2.0000 | ORAL_TABLET | Freq: Four times a day (QID) | ORAL | Status: DC | PRN

## 2012-03-14 NOTE — Telephone Encounter (Signed)
Ck record of prescriptions - may have refill on percocet if she is due. ( 3 months = 3 Rx/s)

## 2012-03-17 ENCOUNTER — Ambulatory Visit: Payer: Medicare HMO | Admitting: Internal Medicine

## 2012-04-18 ENCOUNTER — Other Ambulatory Visit: Payer: Self-pay | Admitting: *Deleted

## 2012-04-18 MED ORDER — METFORMIN HCL ER (MOD) 1000 MG PO TB24: 1000.0000 mg | ORAL_TABLET | Freq: Two times a day (BID) | ORAL | Status: DC

## 2012-05-18 HISTORY — PX: CORONARY STENT PLACEMENT: SHX1402

## 2012-05-26 ENCOUNTER — Other Ambulatory Visit: Payer: Self-pay | Admitting: *Deleted

## 2012-05-26 MED ORDER — AMITRIPTYLINE HCL 150 MG PO TABS: 150.0000 mg | ORAL_TABLET | Freq: Every day | ORAL | Status: DC

## 2012-06-01 ENCOUNTER — Ambulatory Visit (INDEPENDENT_AMBULATORY_CARE_PROVIDER_SITE_OTHER): Payer: Medicare HMO | Admitting: Internal Medicine

## 2012-06-01 ENCOUNTER — Encounter: Payer: Self-pay | Admitting: Internal Medicine

## 2012-06-01 VITALS — BP 158/96 | HR 89 | Temp 99.1°F | Resp 12 | Wt 219.0 lb

## 2012-06-01 DIAGNOSIS — E119 Type 2 diabetes mellitus without complications: Secondary | ICD-10-CM

## 2012-06-01 DIAGNOSIS — E785 Hyperlipidemia, unspecified: Secondary | ICD-10-CM

## 2012-06-01 MED ORDER — OLMESARTAN MEDOXOMIL 40 MG PO TABS: 40.0000 mg | ORAL_TABLET | Freq: Every day | ORAL | Status: DC

## 2012-06-01 MED ORDER — FENOFIBRATE 145 MG PO TABS: 145.0000 mg | ORAL_TABLET | Freq: Every day | ORAL | Status: DC

## 2012-06-01 MED ORDER — OXYCODONE-ACETAMINOPHEN 10-325 MG PO TABS: 2.0000 | ORAL_TABLET | Freq: Four times a day (QID) | ORAL | Status: DC | PRN

## 2012-06-01 MED ORDER — AMOXICILLIN 875 MG PO TABS: 875.0000 mg | ORAL_TABLET | Freq: Two times a day (BID) | ORAL | Status: DC

## 2012-06-01 NOTE — Patient Instructions (Addendum)
Sore throat - persistent for [redacted] weeks along with cough Plan  Amoxicillin 875 mg twice a day for 7 days  Tessalon perle 100 mg three times a day for 10 days  Gargle of choice  Diabetes - last A1C 11.2% August '13 Plan  Continue current insulin regimen  A1C in 1 month  High triglycerides - 1,009, high risk for pancreatitis Plan Continue Crestor  Fenofibrate 145 mg once a day

## 2012-06-01 NOTE — Progress Notes (Signed)
Subjective:    Patient ID: Cynthia Singh, female    DOB: 1941-02-01, 72 y.o.   MRN: [id number]  HPI Cynthia Singh presents for a 3 week h/o sore throat, intermittent odynophagia, no fever. No swollen glands. Also , a hacky non-productive cough. No SOB/DOE.   Recently lost her husband after 52 years. She is appropriately sad.  Past Medical History  Diagnosis Date  . Osteoarthritis of spine   . Hyperlipemia   . Hypertension   . Diabetes mellitus, type 2   . Depressive disorder    Past Surgical History  Procedure Date  . Gallbladder surgery 1968  . Appendectomy 1959  . Combined hysterectomy abdominal w/ a&p repair / oophorectomy 1968  . Multiple d&c   . Tonsillectomy age 72   Family History  Problem Relation Age of Onset  . Cancer Brother     Colon  . Coronary artery disease Neg Hx   . Heart attack Neg Hx    History   Social History  . Marital Status: Married    Spouse Name: N/A    Number of Children: N/A  . Years of Education: N/A   Occupational History  . Not on file.   Social History Main Topics  . Smoking status: Never Smoker   . Smokeless tobacco: Not on file  . Alcohol Use: No  . Drug Use: No  . Sexually Active: Not on file   Other Topics Concern  . 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 amitriptyline (ELAVIL) 150 MG tablet Take 1 tablet (150 mg total) by mouth at bedtime.  30 tablet  2  . Cholecalciferol (VITAMIN D3) 1000 UNITS CAPS Take by mouth daily.        . ferrous gluconate (FERGON) 325 MG tablet Take 325 mg by mouth daily with breakfast.        . gabapentin (NEURONTIN) 300 MG capsule Take 1 capsule (300 mg total) by mouth at bedtime.  30 capsule  2  . Garlic Oil 500 MG CAPS Take by mouth daily.        . insulin aspart  (NOVOLOG) 100 UNIT/ML injection Inject 10 Units into the skin 3 (three) times daily before meals.  3 pen  PRN  . insulin glargine (LANTUS SOLOSTAR) 100 UNIT/ML injection Inject 50 Units into the skin at bedtime. titrate up the dose as instructed  5 pen  PRN  . Insulin Pen Needle 31G X 8 MM MISC 1 Device by Does not apply route daily.  30 each  11  . meclizine (ANTIVERT) 25 MG tablet Take 25 mg by mouth 3 (three) times daily as needed.      . metFORMIN (GLUMETZA) 1000 MG (MOD) 24 hr tablet Take 1 tablet (1,000 mg total) by mouth 2 (two) times daily with a meal.  60 tablet  2  . olmesartan (BENICAR) 40 MG tablet Take 1 tablet (40 mg total) by mouth daily.  90 tablet  1  . oxyCODONE-acetaminophen (PERCOCET) 10-325 MG per tablet Take 2 tablets by mouth every 6 (six) hours as needed for pain. May fill on or after 04/16/2012  240 tablet  0  . oxyCODONE-acetaminophen (PERCOCET) 10-325 MG per tablet Take 2 tablets by mouth every 6 (six) hours as needed for pain. May fill on or  after 05/16/2012  240 tablet  0  . rosuvastatin (CRESTOR) 10 MG tablet Take 10 mg by mouth daily.          Review of Systems System review is negative for any constitutional, cardiac, pulmonary, GI or neuro symptoms or complaints other than as described in the HPI.    Objective:   Physical Exam Filed Vitals:   06/01/12 1556  BP: 158/96  Pulse: 89  Temp: 99.1 F (37.3 C)  Resp: 12   Wt Readings from Last 3 Encounters:  06/01/12 219 lb (99.338 kg)  02/15/12 226 lb (102.513 kg)  01/27/12 227 lb (102.967 kg)   Gen'l- heavy set white woman in no distress HEENT - TM's normal, throat with mild erythema w/o exudate, no oral lesions Cor- RRR Pulm - normal respirations MSK - left knee limitations in movement.       Assessment & Plan:  Sore throat - persistent for [redacted] weeks along with cough Plan  Amoxicillin 875 mg twice a day for 7 days  Tessalon perle 100 mg three times a day for 10 days  Gargle of choice

## 2012-06-02 NOTE — Assessment & Plan Note (Signed)
Diabetes - last A1C 11.2% August '13 Plan  Continue current insulin regimen  A1C in 1 month

## 2012-06-02 NOTE — Assessment & Plan Note (Signed)
High triglycerides - 1,009, high risk for pancreatitis Plan Continue Crestor  Fenofibrate 145 mg once a day  Lab in one month

## 2012-08-17 ENCOUNTER — Ambulatory Visit (INDEPENDENT_AMBULATORY_CARE_PROVIDER_SITE_OTHER): Payer: Medicare HMO | Admitting: Internal Medicine

## 2012-08-17 ENCOUNTER — Encounter: Payer: Self-pay | Admitting: Internal Medicine

## 2012-08-17 ENCOUNTER — Ambulatory Visit (INDEPENDENT_AMBULATORY_CARE_PROVIDER_SITE_OTHER): Payer: Medicare HMO

## 2012-08-17 VITALS — BP 160/80 | HR 81 | Temp 97.4°F | Resp 12 | Ht 68.0 in | Wt 218.8 lb

## 2012-08-17 DIAGNOSIS — M25569 Pain in unspecified knee: Secondary | ICD-10-CM

## 2012-08-17 DIAGNOSIS — E785 Hyperlipidemia, unspecified: Secondary | ICD-10-CM

## 2012-08-17 DIAGNOSIS — E119 Type 2 diabetes mellitus without complications: Secondary | ICD-10-CM

## 2012-08-17 DIAGNOSIS — I10 Essential (primary) hypertension: Secondary | ICD-10-CM

## 2012-08-17 LAB — HEPATIC FUNCTION PANEL
ALT: 30 U/L (ref 0–35)
AST: 26 U/L (ref 0–37)
Albumin: 4.2 g/dL (ref 3.5–5.2)
Alkaline Phosphatase: 87 U/L (ref 39–117)
Bilirubin, Direct: 0.1 mg/dL (ref 0.0–0.3)
Total Bilirubin: 0.6 mg/dL (ref 0.3–1.2)
Total Protein: 7.7 g/dL (ref 6.0–8.3)

## 2012-08-17 LAB — COMPREHENSIVE METABOLIC PANEL
ALT: 30 U/L (ref 0–35)
AST: 26 U/L (ref 0–37)
Albumin: 4.2 g/dL (ref 3.5–5.2)
Alkaline Phosphatase: 87 U/L (ref 39–117)
BUN: 13 mg/dL (ref 6–23)
CO2: 25 mEq/L (ref 19–32)
Calcium: 9.3 mg/dL (ref 8.4–10.5)
Chloride: 98 mEq/L (ref 96–112)
Creatinine, Ser: 0.7 mg/dL (ref 0.4–1.2)
GFR: 92.68 mL/min (ref >60.00)
Glucose, Bld: 229 mg/dL — ABNORMAL HIGH (ref 70–99)
Potassium: 4.3 mEq/L (ref 3.5–5.1)
Sodium: 133 mEq/L — ABNORMAL LOW (ref 135–145)
Total Bilirubin: 0.6 mg/dL (ref 0.3–1.2)
Total Protein: 7.7 g/dL (ref 6.0–8.3)

## 2012-08-17 LAB — LIPID PANEL
Cholesterol: 191 mg/dL (ref 0–200)
HDL: 35.3 mg/dL — ABNORMAL LOW (ref >39.00)
Total CHOL/HDL Ratio: 5
Triglycerides: 547 mg/dL — ABNORMAL HIGH (ref 0.0–149.0)
VLDL: 109.4 mg/dL — ABNORMAL HIGH (ref 0.0–40.0)

## 2012-08-17 LAB — HEMOGLOBIN A1C: Hgb A1c MFr Bld: 10.7 % — ABNORMAL HIGH (ref 4.6–6.5)

## 2012-08-17 MED ORDER — OXYCODONE-ACETAMINOPHEN 10-325 MG PO TABS: 2.0000 | ORAL_TABLET | Freq: Four times a day (QID) | ORAL | Status: DC | PRN

## 2012-08-17 MED ORDER — METFORMIN HCL ER (MOD) 1000 MG PO TB24: 1000.0000 mg | ORAL_TABLET | Freq: Two times a day (BID) | ORAL | Status: DC

## 2012-08-17 MED ORDER — ROSUVASTATIN CALCIUM 10 MG PO TABS: 10.0000 mg | ORAL_TABLET | Freq: Every day | ORAL | Status: DC

## 2012-08-17 MED ORDER — AMITRIPTYLINE HCL 150 MG PO TABS: 150.0000 mg | ORAL_TABLET | Freq: Every day | ORAL | Status: DC

## 2012-08-17 MED ORDER — GABAPENTIN 300 MG PO CAPS: 300.0000 mg | ORAL_CAPSULE | Freq: Every day | ORAL | Status: DC

## 2012-08-17 NOTE — Progress Notes (Signed)
Oxycodone 10-325 mg scripts printed for #240 filling on 08/29/12, 09/28/12 and 10/29/12. Last script filled was on 07/30/12. Filling a day early since there were 31 days in March. Pt took with her at the end of her visit.

## 2012-08-17 NOTE — Patient Instructions (Addendum)
1. Diabetes - does not sound like we have you controlled Plan Lantus - take 30 units of lantus twice a day  Check fasting blood sugar in the morning. If the sugar is 150+ 3 days in a row go up 2 units at each shot (increase of 4 units for the day)   Keep doing this 3 day cycle until the fasting sugar is consistently less than 150.   Novolog - continue 10 units before meals  Check the blood sugar 90 minutes AFTER you eat.  If after meal sugar is great than 200 on a consistent basis, 3 days in a row,  increase the insulin by 2 units  2. Blood pressure  -  BP Readings from Last 3 Encounters:  08/17/12 160/80  06/01/12 158/96  02/15/12 152/86    Be fore changing your medication please get several BP readings at the drug store to be sure you don't have White coat hypertension. Let me know those readings  3. Odd spells - your neuro exam is normal. When you have a spell try checking your blood sugar.   I will send you letter with your lab results.

## 2012-08-17 NOTE — Progress Notes (Signed)
Subjective:    Patient ID: Cynthia Singh, female    DOB: 1941-05-06, 72 y.o.   MRN: [id number]  HPI Cynthia Singh presents for follow up of HTN and DM. She reports fasting glucose has been consistently > 200; 30 min post-prandial CBG > 200. She has not been titrating up the Lantus or the Novolog.  She reports that she will have episodes of feeling disoriented/not with it that last 2-3 minutes and pass. This will happen 3-4 times a week, usually not in succession. She has no focal symptoms - no weakness, paresthesia or cerebellar symptoms. No falls, not related to medication. She does take ASA 81 mg.   Blood pressure in the office is up. She doesn't check BP away from the office.   She continues to have knee pain that affects gait and balance.   Past Medical History  Diagnosis Date  . Osteoarthritis of spine   . Hyperlipemia   . Hypertension   . Diabetes mellitus, type 2   . Depressive disorder    Past Surgical History  Procedure Laterality Date  . Gallbladder surgery  1968  . Appendectomy  1959  . Combined hysterectomy abdominal w/ a&p repair / oophorectomy  1968  . Multiple d&c    . Tonsillectomy  age 7   Family History  Problem Relation Age of Onset  . Cancer Brother     Colon  . Coronary artery disease Neg Hx   . Heart attack Neg Hx    History   Social History  . Marital Status: Widowed    Spouse Name: Cynthia Singh    Number of Children: 3  . Years of Education: 12   Occupational History  . CASHIER    Social History Main Topics  . Smoking status: Never Smoker   . Smokeless tobacco: Never Used  . Alcohol Use: No  . Drug Use: No  . Sexually Active: No   Other Topics Concern  . Not on file   Social History Narrative   HSG Married- '62 widowed '13. 2 sons- '63, '66; one daughter-'69; 11 grandchildren, 4 great grandchildren. Work- Warehouse manager Work, Conservation officer, nature. Production designer, theatre/television/film.          Current Outpatient Prescriptions on File Prior to Visit  Medication Sig Dispense  Refill  . Cholecalciferol (VITAMIN D3) 1000 UNITS CAPS Take by mouth daily.        . fenofibrate (TRICOR) 145 MG tablet Take 1 tablet (145 mg total) by mouth daily.  30 tablet  11  . ferrous gluconate (FERGON) 325 MG tablet Take 325 mg by mouth daily with breakfast.        . Garlic Oil 500 MG CAPS Take by mouth daily.        . insulin aspart (NOVOLOG) 100 UNIT/ML injection Inject 10 Units into the skin 3 (three) times daily before meals.  3 pen  PRN  . insulin glargine (LANTUS SOLOSTAR) 100 UNIT/ML injection Inject 50 Units into the skin at bedtime. titrate up the dose as instructed  5 pen  PRN  . Insulin Pen Needle 31G X 8 MM MISC 1 Device by Does not apply route daily.  30 each  11  . meclizine (ANTIVERT) 25 MG tablet Take 25 mg by mouth 3 (three) times daily as needed.      Marland Kitchen olmesartan (BENICAR) 40 MG tablet Take 1 tablet (40 mg total) by mouth daily.  30 tablet  11  . oxyCODONE-acetaminophen (PERCOCET) 10-325 MG per tablet Take 2 tablets by mouth  every 6 (six) hours as needed for pain. May fill on or after 05/16/2012  240 tablet  0   No current facility-administered medications on file prior to visit.      Review of Systems System review is negative for any constitutional, cardiac, pulmonary, GI or neuro symptoms or complaints other than as described in the HPI.     Objective:   Physical Exam Filed Vitals:   08/17/12 1453  BP: 160/80  Pulse: 81  Temp: 97.4 F (36.3 C)  Resp: 12   Wt Readings from Last 3 Encounters:  08/17/12 218 lb 12.8 oz (99.247 kg)  06/01/12 219 lb (99.338 kg)  02/15/12 226 lb (102.513 kg)   Gen'l -large framed woman in no distress but with painful knees HEENT- C&S clear Cor - 2+ radial RRR Pulm - normal respirations Neuro - A&O x 3, CN II-XII - normal facial symmetry and movement, EOMI, nl shoulder shrug. MS 5/5 and equal. Cerebellar - nl F-N, nl Heel -shin, nl rapid alternating finger movement, nl gait, ok tandem gait (hindered by knees)        Assessment & Plan:  Neuro - poorly describe episodes of transient change in consciousness with a non-focal exam.  Plan  check CBG at next event.

## 2012-08-18 ENCOUNTER — Encounter: Payer: Self-pay | Admitting: Internal Medicine

## 2012-08-18 LAB — LDL CHOLESTEROL, DIRECT: Direct LDL: 88.7 mg/dL

## 2012-08-18 NOTE — Assessment & Plan Note (Signed)
Chronic problem. She wants TKR but needs to have DM controlled before joint replacement  Plan Renewed pain Rx

## 2012-08-18 NOTE — Assessment & Plan Note (Signed)
Diabetes - does not sound like we have you controlled Plan Lantus - take 30 units of lantus twice a day  Check fasting blood sugar in the morning. If the sugar is 150+ 3 days in a row go up 2 units at each shot (increase of 4 units for the day)   Keep doing this 3 day cycle until the fasting sugar is consistently less than 150.   Novolog - continue 10 units before meals  Check the blood sugar 90 minutes AFTER you eat.  If after meal sugar is great than 200 on a consistent basis, 3 days in a row,  increase the insulin by 2 units

## 2012-08-18 NOTE — Assessment & Plan Note (Addendum)
BP Readings from Last 3 Encounters:  08/17/12 160/80  06/01/12 158/96  02/15/12 152/86   Before changing your medication please get several BP readings at the drug store to be sure you don't have White coat hypertension. Let me know those readings

## 2012-08-21 ENCOUNTER — Encounter: Payer: Self-pay | Admitting: Internal Medicine

## 2012-08-21 DIAGNOSIS — E119 Type 2 diabetes mellitus without complications: Secondary | ICD-10-CM

## 2012-08-23 ENCOUNTER — Telehealth: Payer: Self-pay | Admitting: Internal Medicine

## 2012-08-23 MED ORDER — OXYCODONE-ACETAMINOPHEN 10-325 MG PO TABS: 2.0000 | ORAL_TABLET | Freq: Four times a day (QID) | ORAL | Status: DC | PRN

## 2012-08-23 NOTE — Telephone Encounter (Signed)
Patient states that she lost her billfold that had her percocet in it that she takes for her arthritis and does not have any to control her pain until her refill can be done on Monday 08/29/12.  Patient would like to know if she can get authorization for an early refill or something called in to get her through until she can get her refill on Monday 08/29/12.  Pain is a 10 on pain scale in her knees.  Patient takes Percocet 2 tabs every 6 hours as needed for pain and states that she was having to take them every 6 hours to keep pain under control.  Patient has been without her medication since yesterday and does not feel that she will make it until Monday without some pain control.   OFFICE NOTE: PLEASE FOLLOW UP WITH PATIENT ON PAIN MEDICATION REQUEST.

## 2012-08-23 NOTE — Telephone Encounter (Signed)
Rx printed and placed on side A counter for MD signature

## 2012-08-23 NOTE — Telephone Encounter (Signed)
May call in #40 of her percocet to get her to the 14th. She needs to know that her insurance may not pay for early meds.  She is to be admonished that this CAN not happen again - review her contract. She needs to keep close track of her medications.

## 2012-08-24 ENCOUNTER — Telehealth: Payer: Self-pay

## 2012-08-24 NOTE — Telephone Encounter (Signed)
Phone call to pt to let her know her prescription for Oxycodone is available at the front desk for pick up. She did not have questions or concerns.

## 2012-08-26 ENCOUNTER — Ambulatory Visit: Payer: Medicare HMO | Admitting: Internal Medicine

## 2012-10-19 ENCOUNTER — Encounter (HOSPITAL_COMMUNITY): Payer: Self-pay | Admitting: Emergency Medicine

## 2012-10-19 ENCOUNTER — Emergency Department (HOSPITAL_COMMUNITY): Payer: Medicare HMO

## 2012-10-19 ENCOUNTER — Inpatient Hospital Stay (HOSPITAL_COMMUNITY)
Admission: EM | Admit: 2012-10-19 | Discharge: 2012-10-21 | DRG: 247 | Disposition: A | Discharge status: Home / Self Care | Payer: Medicare HMO | Attending: Internal Medicine | Admitting: Internal Medicine

## 2012-10-19 DIAGNOSIS — E119 Type 2 diabetes mellitus without complications: Secondary | ICD-10-CM | POA: Diagnosis present

## 2012-10-19 DIAGNOSIS — R079 Chest pain, unspecified: Secondary | ICD-10-CM | POA: Insufficient documentation

## 2012-10-19 DIAGNOSIS — F39 Unspecified mood [affective] disorder: Secondary | ICD-10-CM | POA: Diagnosis present

## 2012-10-19 DIAGNOSIS — E669 Obesity, unspecified: Secondary | ICD-10-CM | POA: Diagnosis present

## 2012-10-19 DIAGNOSIS — Z6833 Body mass index (BMI) 33.0-33.9, adult: Secondary | ICD-10-CM

## 2012-10-19 DIAGNOSIS — E1159 Type 2 diabetes mellitus with other circulatory complications: Secondary | ICD-10-CM | POA: Diagnosis present

## 2012-10-19 DIAGNOSIS — I200 Unstable angina: Secondary | ICD-10-CM

## 2012-10-19 DIAGNOSIS — M25561 Pain in right knee: Secondary | ICD-10-CM

## 2012-10-19 DIAGNOSIS — Z90710 Acquired absence of both cervix and uterus: Secondary | ICD-10-CM

## 2012-10-19 DIAGNOSIS — Z794 Long term (current) use of insulin: Secondary | ICD-10-CM

## 2012-10-19 DIAGNOSIS — Z7902 Long term (current) use of antithrombotics/antiplatelets: Secondary | ICD-10-CM

## 2012-10-19 DIAGNOSIS — Z955 Presence of coronary angioplasty implant and graft: Secondary | ICD-10-CM

## 2012-10-19 DIAGNOSIS — E871 Hypo-osmolality and hyponatremia: Secondary | ICD-10-CM | POA: Diagnosis present

## 2012-10-19 DIAGNOSIS — M479 Spondylosis, unspecified: Secondary | ICD-10-CM | POA: Diagnosis present

## 2012-10-19 DIAGNOSIS — E781 Pure hyperglyceridemia: Secondary | ICD-10-CM | POA: Diagnosis present

## 2012-10-19 DIAGNOSIS — M1990 Unspecified osteoarthritis, unspecified site: Secondary | ICD-10-CM

## 2012-10-19 DIAGNOSIS — E1165 Type 2 diabetes mellitus with hyperglycemia: Secondary | ICD-10-CM | POA: Diagnosis present

## 2012-10-19 DIAGNOSIS — F3289 Other specified depressive episodes: Secondary | ICD-10-CM | POA: Diagnosis present

## 2012-10-19 DIAGNOSIS — Z9089 Acquired absence of other organs: Secondary | ICD-10-CM

## 2012-10-19 DIAGNOSIS — Z7982 Long term (current) use of aspirin: Secondary | ICD-10-CM

## 2012-10-19 DIAGNOSIS — E785 Hyperlipidemia, unspecified: Secondary | ICD-10-CM | POA: Diagnosis present

## 2012-10-19 DIAGNOSIS — Z79899 Other long term (current) drug therapy: Secondary | ICD-10-CM

## 2012-10-19 DIAGNOSIS — I2510 Atherosclerotic heart disease of native coronary artery without angina pectoris: Principal | ICD-10-CM | POA: Diagnosis present

## 2012-10-19 DIAGNOSIS — M1710 Unilateral primary osteoarthritis, unspecified knee: Secondary | ICD-10-CM | POA: Diagnosis present

## 2012-10-19 DIAGNOSIS — Z780 Asymptomatic menopausal state: Secondary | ICD-10-CM

## 2012-10-19 DIAGNOSIS — I10 Essential (primary) hypertension: Secondary | ICD-10-CM | POA: Diagnosis present

## 2012-10-19 DIAGNOSIS — F329 Major depressive disorder, single episode, unspecified: Secondary | ICD-10-CM

## 2012-10-19 DIAGNOSIS — I25119 Atherosclerotic heart disease of native coronary artery with unspecified angina pectoris: Secondary | ICD-10-CM

## 2012-10-19 HISTORY — DX: Obesity, unspecified: E66.9

## 2012-10-19 HISTORY — DX: Atherosclerotic heart disease of native coronary artery without angina pectoris: I25.10

## 2012-10-19 LAB — COMPREHENSIVE METABOLIC PANEL
ALT: 14 U/L (ref 0–35)
AST: 21 U/L (ref 0–37)
Albumin: 3.7 g/dL (ref 3.5–5.2)
Alkaline Phosphatase: 90 U/L (ref 39–117)
BUN: 18 mg/dL (ref 6–23)
CO2: 22 mEq/L (ref 19–32)
Calcium: 9 mg/dL (ref 8.4–10.5)
Chloride: 94 mEq/L — ABNORMAL LOW (ref 96–112)
Creatinine, Ser: 0.87 mg/dL (ref 0.50–1.10)
GFR calc Af Amer: 77 mL/min — ABNORMAL LOW (ref >90)
GFR calc non Af Amer: 66 mL/min — ABNORMAL LOW (ref >90)
Glucose, Bld: 519 mg/dL — ABNORMAL HIGH (ref 70–99)
Potassium: 4.3 mEq/L (ref 3.5–5.1)
Sodium: 130 mEq/L — ABNORMAL LOW (ref 135–145)
Total Bilirubin: 0.3 mg/dL (ref 0.3–1.2)
Total Protein: 6.9 g/dL (ref 6.0–8.3)

## 2012-10-19 LAB — MRSA PCR SCREENING: MRSA by PCR: NEGATIVE

## 2012-10-19 LAB — URINE MICROSCOPIC-ADD ON

## 2012-10-19 LAB — CBC
HCT: 39.9 % (ref 36.0–46.0)
HCT: 38.9 % (ref 36.0–46.0)
Hemoglobin: 13.8 g/dL (ref 12.0–15.0)
Hemoglobin: 13.2 g/dL (ref 12.0–15.0)
MCH: 31 pg (ref 26.0–34.0)
MCH: 30.3 pg (ref 26.0–34.0)
MCHC: 34.6 g/dL (ref 30.0–36.0)
MCHC: 33.9 g/dL (ref 30.0–36.0)
MCV: 89.7 fL (ref 78.0–100.0)
MCV: 89.2 fL (ref 78.0–100.0)
Platelets: 159 10*3/uL (ref 150–400)
Platelets: 162 10*3/uL (ref 150–400)
RBC: 4.45 MIL/uL (ref 3.87–5.11)
RBC: 4.36 MIL/uL (ref 3.87–5.11)
RDW: 12.6 % (ref 11.5–15.5)
RDW: 12.7 % (ref 11.5–15.5)
WBC: 6.3 10*3/uL (ref 4.0–10.5)
WBC: 7.8 10*3/uL (ref 4.0–10.5)

## 2012-10-19 LAB — TROPONIN I: Troponin I: <0.3 ng/mL (ref <0.30)

## 2012-10-19 LAB — URINALYSIS, ROUTINE W REFLEX MICROSCOPIC
Bilirubin Urine: NEGATIVE
Glucose, UA: >1000 mg/dL — AB
Hgb urine dipstick: NEGATIVE
Ketones, ur: NEGATIVE mg/dL
Nitrite: NEGATIVE
Protein, ur: NEGATIVE mg/dL
Specific Gravity, Urine: 1.044 — ABNORMAL HIGH (ref 1.005–1.030)
Urobilinogen, UA: 1 mg/dL (ref 0.0–1.0)
pH: 5.5 (ref 5.0–8.0)

## 2012-10-19 LAB — CREATININE, SERUM
Creatinine, Ser: 0.7 mg/dL (ref 0.50–1.10)
GFR calc Af Amer: >90 mL/min (ref >90)
GFR calc non Af Amer: 86 mL/min — ABNORMAL LOW (ref >90)

## 2012-10-19 LAB — GLUCOSE, CAPILLARY
Glucose-Capillary: 463 mg/dL — ABNORMAL HIGH (ref 70–99)
Glucose-Capillary: 357 mg/dL — ABNORMAL HIGH (ref 70–99)
Glucose-Capillary: 224 mg/dL — ABNORMAL HIGH (ref 70–99)
Glucose-Capillary: 206 mg/dL — ABNORMAL HIGH (ref 70–99)

## 2012-10-19 LAB — POCT I-STAT TROPONIN I: Troponin i, poc: 0 ng/mL (ref 0.00–0.08)

## 2012-10-19 LAB — BUN: BUN: 18 mg/dL (ref 6–23)

## 2012-10-19 LAB — LIPASE, BLOOD: Lipase: 20 U/L (ref 11–59)

## 2012-10-19 MED ORDER — OXYCODONE HCL 5 MG PO TABS
5.0000 mg | ORAL_TABLET | Freq: Four times a day (QID) | ORAL | Status: DC | PRN
  Administered 2012-10-20 – 2012-10-21 (×4): 5 mg via ORAL
  Filled 2012-10-19 (×4): qty 1

## 2012-10-19 MED ORDER — INSULIN GLARGINE 100 UNIT/ML ~~LOC~~ SOLN
35.0000 [IU] | Freq: Every day | SUBCUTANEOUS | Status: DC
  Administered 2012-10-19 – 2012-10-20 (×2): 35 [IU] via SUBCUTANEOUS
  Filled 2012-10-19 (×3): qty 0.35

## 2012-10-19 MED ORDER — MORPHINE SULFATE 2 MG/ML IJ SOLN
1.0000 mg | INTRAMUSCULAR | Status: DC | PRN
  Administered 2012-10-19 – 2012-10-21 (×7): 1 mg via INTRAVENOUS
  Filled 2012-10-19 (×8): qty 1

## 2012-10-19 MED ORDER — VITAMIN D3 25 MCG (1000 UNIT) PO TABS
1000.0000 [IU] | ORAL_TABLET | Freq: Every day | ORAL | Status: DC
  Administered 2012-10-20 – 2012-10-21 (×2): 1000 [IU] via ORAL
  Filled 2012-10-19 (×2): qty 1

## 2012-10-19 MED ORDER — IRBESARTAN 150 MG PO TABS: 150.0000 mg | ORAL_TABLET | Freq: Every day | ORAL | Status: DC

## 2012-10-19 MED ORDER — HEPARIN SODIUM (PORCINE) 5000 UNIT/ML IJ SOLN
5000.0000 [IU] | Freq: Three times a day (TID) | INTRAMUSCULAR | Status: DC
  Administered 2012-10-19 – 2012-10-21 (×5): 5000 [IU] via SUBCUTANEOUS
  Filled 2012-10-19 (×8): qty 1

## 2012-10-19 MED ORDER — ACETAMINOPHEN 650 MG RE SUPP: 650.0000 mg | Freq: Four times a day (QID) | RECTAL | Status: DC | PRN

## 2012-10-19 MED ORDER — FENOFIBRATE 160 MG PO TABS
160.0000 mg | ORAL_TABLET | Freq: Every day | ORAL | Status: DC
  Administered 2012-10-20 – 2012-10-21 (×2): 160 mg via ORAL
  Filled 2012-10-19 (×2): qty 1

## 2012-10-19 MED ORDER — ONDANSETRON HCL 4 MG/2ML IJ SOLN: 4.0000 mg | Freq: Four times a day (QID) | INTRAMUSCULAR | Status: DC | PRN

## 2012-10-19 MED ORDER — IOHEXOL 350 MG/ML SOLN
100.0000 mL | Freq: Once | INTRAVENOUS | Status: AC | PRN
  Administered 2012-10-19: 100 mL via INTRAVENOUS

## 2012-10-19 MED ORDER — SODIUM CHLORIDE 0.9 % IV BOLUS (SEPSIS): 1000.0000 mL | Freq: Once | INTRAVENOUS | Status: DC

## 2012-10-19 MED ORDER — OXYCODONE-ACETAMINOPHEN 10-325 MG PO TABS: 1.0000 | ORAL_TABLET | Freq: Four times a day (QID) | ORAL | Status: DC | PRN

## 2012-10-19 MED ORDER — INSULIN ASPART 100 UNIT/ML ~~LOC~~ SOLN
0.0000 [IU] | Freq: Three times a day (TID) | SUBCUTANEOUS | Status: DC
  Administered 2012-10-19 – 2012-10-20 (×2): 5 [IU] via SUBCUTANEOUS
  Administered 2012-10-20 – 2012-10-21 (×2): 3 [IU] via SUBCUTANEOUS
  Administered 2012-10-21: 5 [IU] via SUBCUTANEOUS

## 2012-10-19 MED ORDER — NITROGLYCERIN 0.4 MG SL SUBL
SUBLINGUAL_TABLET | SUBLINGUAL | Status: AC
  Filled 2012-10-19: qty 25

## 2012-10-19 MED ORDER — MECLIZINE HCL 25 MG PO TABS
25.0000 mg | ORAL_TABLET | Freq: Three times a day (TID) | ORAL | Status: DC | PRN
  Filled 2012-10-19: qty 1

## 2012-10-19 MED ORDER — SODIUM CHLORIDE 0.9 % IV BOLUS (SEPSIS): 250.0000 mL | Freq: Once | INTRAVENOUS | Status: DC

## 2012-10-19 MED ORDER — ATORVASTATIN CALCIUM 20 MG PO TABS
20.0000 mg | ORAL_TABLET | Freq: Every day | ORAL | Status: DC
  Administered 2012-10-19: 20 mg via ORAL
  Filled 2012-10-19 (×2): qty 1

## 2012-10-19 MED ORDER — SODIUM CHLORIDE 0.9 % IV BOLUS (SEPSIS)
500.0000 mL | Freq: Once | INTRAVENOUS | Status: AC
  Administered 2012-10-19: 500 mL via INTRAVENOUS

## 2012-10-19 MED ORDER — IRBESARTAN 75 MG PO TABS
75.0000 mg | ORAL_TABLET | Freq: Every day | ORAL | Status: DC
  Administered 2012-10-20 – 2012-10-21 (×2): 75 mg via ORAL
  Filled 2012-10-19 (×2): qty 1

## 2012-10-19 MED ORDER — ONDANSETRON HCL 4 MG PO TABS
4.0000 mg | ORAL_TABLET | Freq: Four times a day (QID) | ORAL | Status: DC | PRN
  Administered 2012-10-20: 4 mg via ORAL
  Filled 2012-10-19 (×2): qty 1

## 2012-10-19 MED ORDER — MORPHINE SULFATE 4 MG/ML IJ SOLN
4.0000 mg | Freq: Once | INTRAMUSCULAR | Status: AC
  Administered 2012-10-19: 4 mg via INTRAVENOUS
  Filled 2012-10-19: qty 1

## 2012-10-19 MED ORDER — AMITRIPTYLINE HCL 75 MG PO TABS
150.0000 mg | ORAL_TABLET | Freq: Every day | ORAL | Status: DC
  Administered 2012-10-19 – 2012-10-20 (×2): 150 mg via ORAL
  Filled 2012-10-19 (×3): qty 2

## 2012-10-19 MED ORDER — FERROUS GLUCONATE 324 (38 FE) MG PO TABS
325.0000 mg | ORAL_TABLET | Freq: Every day | ORAL | Status: DC
  Administered 2012-10-20 – 2012-10-21 (×2): 324 mg via ORAL
  Filled 2012-10-19 (×3): qty 1

## 2012-10-19 MED ORDER — INSULIN ASPART 100 UNIT/ML ~~LOC~~ SOLN: 0.0000 [IU] | Freq: Three times a day (TID) | SUBCUTANEOUS | Status: DC

## 2012-10-19 MED ORDER — ASPIRIN EC 81 MG PO TBEC
81.0000 mg | DELAYED_RELEASE_TABLET | Freq: Every day | ORAL | Status: DC
  Administered 2012-10-20 – 2012-10-21 (×2): 81 mg via ORAL
  Filled 2012-10-19 (×2): qty 1

## 2012-10-19 MED ORDER — PANTOPRAZOLE SODIUM 40 MG PO TBEC
40.0000 mg | DELAYED_RELEASE_TABLET | Freq: Every day | ORAL | Status: DC
  Administered 2012-10-20 – 2012-10-21 (×2): 40 mg via ORAL
  Filled 2012-10-19 (×2): qty 1

## 2012-10-19 MED ORDER — VITAMIN D3 25 MCG (1000 UT) PO CAPS: 1.0000 | ORAL_CAPSULE | Freq: Every day | ORAL | Status: DC

## 2012-10-19 MED ORDER — ACETAMINOPHEN 325 MG PO TABS
650.0000 mg | ORAL_TABLET | Freq: Four times a day (QID) | ORAL | Status: DC | PRN
  Filled 2012-10-19: qty 2

## 2012-10-19 MED ORDER — GABAPENTIN 300 MG PO CAPS
300.0000 mg | ORAL_CAPSULE | Freq: Every day | ORAL | Status: DC
  Administered 2012-10-19 – 2012-10-20 (×2): 300 mg via ORAL
  Filled 2012-10-19 (×3): qty 1

## 2012-10-19 MED ORDER — OXYCODONE-ACETAMINOPHEN 5-325 MG PO TABS
1.0000 | ORAL_TABLET | Freq: Four times a day (QID) | ORAL | Status: DC | PRN
  Administered 2012-10-19 – 2012-10-21 (×6): 1 via ORAL
  Filled 2012-10-19 (×6): qty 1

## 2012-10-19 MED ORDER — ONDANSETRON HCL 4 MG/2ML IJ SOLN
4.0000 mg | Freq: Once | INTRAMUSCULAR | Status: AC
  Administered 2012-10-19: 4 mg via INTRAVENOUS
  Filled 2012-10-19: qty 2

## 2012-10-19 MED ORDER — INSULIN ASPART 100 UNIT/ML ~~LOC~~ SOLN
8.0000 [IU] | Freq: Once | SUBCUTANEOUS | Status: AC
  Administered 2012-10-19: 8 [IU] via SUBCUTANEOUS
  Filled 2012-10-19: qty 1

## 2012-10-19 MED ORDER — SODIUM CHLORIDE 0.9 % IV SOLN
INTRAVENOUS | Status: DC
  Administered 2012-10-19: 18:00:00 via INTRAVENOUS
  Administered 2012-10-20: 1000 mL via INTRAVENOUS

## 2012-10-19 MED ORDER — NITROGLYCERIN 0.4 MG SL SUBL
0.4000 mg | SUBLINGUAL_TABLET | SUBLINGUAL | Status: DC | PRN
  Administered 2012-10-19: 0.4 mg via SUBLINGUAL

## 2012-10-19 MED ORDER — CARVEDILOL 3.125 MG PO TABS
3.1250 mg | ORAL_TABLET | Freq: Two times a day (BID) | ORAL | Status: DC
  Administered 2012-10-20 – 2012-10-21 (×3): 3.125 mg via ORAL
  Filled 2012-10-19 (×5): qty 1

## 2012-10-19 NOTE — Progress Notes (Signed)
Pt has purse with her ask her if her family could take any money or personall items home she said no I have 30 -40 dollars in cash and my checkbook and I dont want them to get it. Ask if security could lock up her items and she refused

## 2012-10-19 NOTE — ED Provider Notes (Signed)
Medical screening examination/treatment/procedure(s) were conducted as a shared visit with non-physician practitioner(s) and myself.  I personally evaluated the patient during the encounter  Pt seen and examined--she describes dyspnea and chest pain, lungs with faint crackles but otherwise clear, no pedal edema--concerned for PE, will check chest ct and likely admit for pending those results  Toy Baker, MD 10/19/12 [phone]

## 2012-10-19 NOTE — ED Notes (Signed)
Attempted report 

## 2012-10-19 NOTE — ED Notes (Signed)
Patient transported to X-ray 

## 2012-10-19 NOTE — ED Notes (Signed)
Pt c/o hyperglycemia and HA, abd pain and chest pain; pt sts back pain; pt sts started this am

## 2012-10-19 NOTE — ED Notes (Signed)
NS opened on pump to 999 ml/hr with remaining 300 ml for bolus d/t BP. PA made aware. No further orders. Pt alert and oriented and in NAD at this time. Awaiting admission physician.

## 2012-10-19 NOTE — ED Notes (Signed)
Wyatt Mage- Daughter- [phone]

## 2012-10-19 NOTE — H&P (Signed)
[facility] History and Physical  Cynthia Singh VHQ:[id number] DOB: [birth year] DOA: 10/19/2012  Referring physician: Dr. Freida Busman PCP: Illene Regulus, MD   Chief Complaint: chest pain, SOB; hyperglycemia  HPI: Cynthia Singh is a 72 y.o. female with pmh significant for DM (on insulin and uncontrolled), HTN, HLD and depression; came to ED complaining of substernal chest discomfort, radiated to her left arm and with associated with dyspnea and nausea. Patient symptoms completely resolved with the use of NTG and morphine while in the ED. Patient endorses similar symptoms on/off for the last 4 months or so; per patient symptoms occurred at rest and exertion. Today pain was worse and lasted longer than in the past. She denies vomiting, fever, chills, cough, hematochezia, melena or any other complaints. [facility] called to admit patient for further evaluation and treatment.  Review of Systems:  Negative except as otherwise mentioned on HPI   Past Medical History  Diagnosis Date  . Osteoarthritis of spine   . Hyperlipemia   . Hypertension   . Diabetes mellitus, type 2   . Depressive disorder    Past Surgical History  Procedure Laterality Date  . Gallbladder surgery  1968  . Appendectomy  1959  . Combined hysterectomy abdominal w/ a&p repair / oophorectomy  1968  . Multiple d&c    . Tonsillectomy  age 47   Social History:  reports that she has never smoked. She has never used smokeless tobacco. She reports that she does not drink alcohol or use illicit drugs. Live at home by herself; denies needs of assistance with ADL's   Family History  Problem Relation Age of Onset  . Cancer Brother     Colon  . Coronary artery disease Neg Hx   . Heart attack Neg Hx     Prior to Admission medications   Medication Sig Start Date End Date Taking? Authorizing Provider  amitriptyline (ELAVIL) 150 MG tablet Take 1 tablet (150 mg total) by mouth at bedtime. 08/17/12  Yes Jacques Navy, MD   Cholecalciferol (VITAMIN D3) 1000 UNITS CAPS Take by mouth daily.     Yes Historical Provider, MD  fenofibrate (TRICOR) 145 MG tablet Take 1 tablet (145 mg total) by mouth daily. 06/01/12  Yes Jacques Navy, MD  ferrous gluconate (FERGON) 325 MG tablet Take 325 mg by mouth daily with breakfast.     Yes Historical Provider, MD  gabapentin (NEURONTIN) 300 MG capsule Take 1 capsule (300 mg total) by mouth at bedtime. 08/17/12 08/17/13 Yes Jacques Navy, MD  Garlic Oil 500 MG CAPS Take by mouth daily.     Yes Historical Provider, MD  insulin aspart (NOVOLOG) 100 UNIT/ML injection Inject 10 Units into the skin 3 (three) times daily before meals. 01/09/12 01/08/13 Yes Jacques Navy, MD  insulin glargine (LANTUS SOLOSTAR) 100 UNIT/ML injection Inject 50 Units into the skin at bedtime. titrate up the dose as instructed 01/06/12  Yes Jacques Navy, MD  Insulin Pen Needle 31G X 8 MM MISC 1 Device by Does not apply route daily. 08/05/11  Yes Jacques Navy, MD  meclizine (ANTIVERT) 25 MG tablet Take 25 mg by mouth 3 (three) times daily as needed. 04/14/11  Yes Newt Lukes, MD  metFORMIN (GLUMETZA) 1000 MG (MOD) 24 hr tablet Take 1 tablet (1,000 mg total) by mouth 2 (two) times daily with a meal. 08/17/12  Yes Jacques Navy, MD  olmesartan (BENICAR) 40 MG tablet Take 1 tablet (40 mg total) by  mouth daily. 06/01/12  Yes Jacques Navy, MD  oxyCODONE-acetaminophen (PERCOCET) 10-325 MG per tablet Take 2 tablets by mouth every 6 (six) hours as needed for pain. May fill on or after 08/23/12. 08/23/12  Yes Jacques Navy, MD  rosuvastatin (CRESTOR) 10 MG tablet Take 1 tablet (10 mg total) by mouth daily. 08/17/12  Yes Jacques Navy, MD   Physical Exam: Filed Vitals:   10/19/12 1230 10/19/12 1245 10/19/12 1300 10/19/12 1400  BP: 97/51 96/53 107/60 112/49  Pulse: 79 79 78 74  Temp:    97.4 F (36.3 C)  TempSrc:    Oral  Resp: 16 17 18 13   Height:    5\' 8"  (1.727 m)  Weight:    99.5 kg (219 lb 5.7  oz)  SpO2:    99%     General:  Afebrile, cooperative to exam; currently CP free  Eyes: PERRL, EOMI, no icterus, no nystagmus  ENT: moist MM, no erythema or exudates; no drainage out of ears or nostrils  Neck: no thyromegaly, no bruits, no JVD  Cardiovascular: S1 and S2; no rubs or gallops  Respiratory: CTA bilaterally  Abdomen: soft, NT, ND, positive BS  Skin: no rash or petechiae  Musculoskeletal: no joint swelling or erythema  Psychiatric: no SI or hallucinations  Neurologic: AAOX3, CN intact, no focal motor or sensory deficits; normal finger to nose  Labs on Admission:  Basic Metabolic Panel:  Recent Labs Lab 10/19/12 0748 10/19/12 0837  NA 130*  --   K 4.3  --   CL 94*  --   CO2 22  --   GLUCOSE 519*  --   BUN 18 18  CREATININE 0.87  --   CALCIUM 9.0  --    Liver Function Tests:  Recent Labs Lab 10/19/12 0748  AST 21  ALT 14  ALKPHOS 90  BILITOT 0.3  PROT 6.9  ALBUMIN 3.7   No results found for this basename: LIPASE, AMYLASE,  in the last 168 hours No results found for this basename: AMMONIA,  in the last 168 hours CBC:  Recent Labs Lab 10/19/12 0748  WBC 6.3  HGB 13.8  HCT 39.9  MCV 89.7  PLT 159   CBG:  Recent Labs Lab 10/19/12 0754 10/19/12 1059  GLUCAP 463* 357*    Radiological Exams on Admission: Dg Chest 2 View  10/19/2012   *RADIOLOGY REPORT*  Clinical Data: Abdominal pain, hyperglycemia  CHEST - 2 VIEW  Comparison: 05/28/2010  Findings: Cardiomediastinal silhouette is stable.  Mild thoracic dextroscoliosis.  Degenerative changes thoracic spine. Degenerative changes bilateral AC joints.  There is streaky left basilar atelectasis or infiltrate.  No pulmonary edema.  IMPRESSION: Streaky left basilar atelectasis or infiltrate.  No pulmonary edema.   Original Report Authenticated By: Natasha Mead, M.D.   Ct Angio Chest Pe W/cm &/or Wo Cm  10/19/2012   *RADIOLOGY REPORT*  Clinical Data: Pain, dyspnea, possible pulmonary embolus  CT  ANGIOGRAPHY CHEST  Technique:  Multidetector CT imaging of the chest using the standard protocol during bolus administration of intravenous contrast. Multiplanar reconstructed images including MIPs were obtained and reviewed to evaluate the vascular anatomy.  Contrast: OMNIPAQUE IOHEXOL 350 MG/ML SOLN  Comparison: None.  Findings: Central airways are patent.  Images of the thoracic inlet are unremarkable.  Heart size within normal limits.  No pericardial effusion.  Visualized upper abdomen is unremarkable.  No aortic aneurysm.  Mitral valve calcifications are noted. Atherosclerotic calcifications of the coronary arteries.  No hilar or mediastinal adenopathy.  No pulmonary embolus is identified.  Images of the lung parenchyma shows no acute infiltrate or pulmonary edema.  Sagittal images of the spine shows degenerative changes thoracic spine.  Sagittal view of the sternum is unremarkable.  IMPRESSION:  1.  No pulmonary embolus is noted. 2.  Degenerative changes thoracic spine.  No acute infiltrate or pulmonary edema. 3.  Mitral valve calcifications.  Coronary artery calcifications are noted.   Original Report Authenticated By: Natasha Mead, M.D.    EKG:  Rate: 97  Rhythm: normal sinus rhythm  QRS Axis: normal  Intervals: normal  ST/T Wave abnormalities: normal  Conduction Disutrbances:none  Narrative Interpretation:  Old EKG Reviewed: unchanged  Assessment/Plan 1-Chest pain: patient with significant risk factors (age, HTN, HLD,DM); typical presentation. -will admit to stepdown -start ASA, B-blockers, statins; continue ARB and use PRN NTG -patient pain completely resolved with NTG/morphine -will consult cardiology (will allow them to decide on heparin drip; patient is now CP free and her troponins are negative), patient needs cath or at least myoview -will check lipase and will start PPI -CXR negative and CT angio w/o PE -2-D echo ordered to evaluate wall motion, EF and heart valve  structure  2-DIABETES-TYPE 2:uncontrolled on admission -will repeat A1C -modified carb diet -lantus and SSI  3-HYPERLIPIDEMIA:will continue statins and will check a lipid profile  4-DEPRESSIVE DISORDER:stable. Will continue elavil  5-HYPERTENSION:currently well controlled. Initially low in the ED with use of morphine and NTG. Responded well to IVF's. Will monitor.   6-Hyponatremia:secondary to hyperglycemia. Will treat IVF's, control blood sugar and follow BMET in am.  [place] [facility] (Dr. Eden Emms)  Code Status: Full Family Communication: no family at bedside Disposition Plan: admit to step down; inpatient; LOS > 2 midnights.  Time spent: >30 minutes  Yarnell Arvidson [facility] Pager [phone]  If 7PM-7AM, please contact night-coverage [email or web address] Password [facility] 10/19/2012, 4:25 PM

## 2012-10-19 NOTE — Consult Note (Signed)
CARDIOLOGY CONSULT NOTE  Patient ID: Cynthia Singh MRN: [id number], DOB/AGE: 1941-02-28   Admit date: 10/19/2012 Date of Consult: 10/19/2012  Primary Physician: Illene Regulus, MD Primary Cardiologist: new - seen by P. Eden Emms, MD   Pt. Profile  72 year old female with poorly controlled diabetes mellitus who presented to the ED today with chest pain.  Problem List  Past Medical History  Diagnosis Date  . Osteoarthritis of spine   . Hyperlipemia   . Hypertension   . Diabetes mellitus, type 2   . Depressive disorder   . CAD (coronary artery disease)     a. 9.2001 Cath: LM nl, LAD 30p, LCX nl, OM1 30-40p, RCA dominant diffuse irregs, EF 60-65%;  b. 05/2010 Lexi MV EF 58%, no isch/infarct.    Past Surgical History  Procedure Laterality Date  . Gallbladder surgery  1968  . Appendectomy  1959  . Combined hysterectomy abdominal w/ a&p repair / oophorectomy  1968  . Multiple d&c    . Tonsillectomy  age 74    Allergies  No Known Allergies  HPI   72 year old female with the above problem list. She has a long history of poorly controlled diabetes mellitus. She underwent diagnostic catheterization secondary to chest pain in 2001, and this showed mild nonobstructive CAD. She had stress testing in 2012 which was also normal. Over the past 6 months, she has been experiencing intermittent substernal chest discomfort, often associated with nausea, occurring both at rest and with exertion, lasting 10-15 minutes, and resolving spontaneously. Symptoms have been occurring more frequently and are now often occurring daily. 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 This was associated with dyspnea. After several hours, she presented to the ED where initial troponin was normal and ECG showed prior inferior infarct with poor R-wave progression which was more pronounced than on  prior ECG. She received nitroglycerin and morphine in the ED with resultant hypotension requiring a fluid bolus.  After approximately 4 hours of ongoing symptoms, she became pain-free. She remains pain-free and we have been asked to evaluate. She denies PND, orthopnea, dizziness, syncope, edema or early satiety.  Of note, her glucose was 519 in the ED with a urine glucose greater than 1000. Urine ketones are negative.  Home Medications  Prior to Admission medications   Medication Sig Start Date End Date Taking? Authorizing Provider  amitriptyline (ELAVIL) 150 MG tablet Take 1 tablet (150 mg total) by mouth at bedtime. 08/17/12  Yes Jacques Navy, MD  Cholecalciferol (VITAMIN D3) 1000 UNITS CAPS Take by mouth daily.     Yes Historical Provider, MD  fenofibrate (TRICOR) 145 MG tablet Take 1 tablet (145 mg total) by mouth daily. 06/01/12  Yes Jacques Navy, MD  ferrous gluconate (FERGON) 325 MG tablet Take 325 mg by mouth daily with breakfast.     Yes Historical Provider, MD  gabapentin (NEURONTIN) 300 MG capsule Take 1 capsule (300 mg total) by mouth at bedtime. 08/17/12 08/17/13 Yes Jacques Navy, MD  Garlic Oil 500 MG CAPS Take by mouth daily.     Yes Historical Provider, MD  insulin aspart (NOVOLOG) 100 UNIT/ML injection Inject 10 Units into the skin 3 (three) times daily before meals. 01/09/12 01/08/13 Yes Jacques Navy, MD  insulin glargine (LANTUS SOLOSTAR) 100 UNIT/ML injection Inject 50 Units into the skin at bedtime. titrate up the dose as instructed 01/06/12  Yes Jacques Navy, MD  Insulin Pen Needle 31G X 8 MM MISC 1 Device by Does not apply route daily. 08/05/11  Yes Jacques Navy, MD  meclizine (ANTIVERT) 25 MG tablet Take 25 mg by mouth 3 (three) times daily as needed. 04/14/11  Yes Newt Lukes, MD  metFORMIN (GLUMETZA) 1000 MG (MOD) 24 hr tablet Take 1 tablet (1,000 mg total) by mouth 2 (two) times daily with a meal. 08/17/12  Yes Jacques Navy, MD  olmesartan (BENICAR)  40 MG tablet Take 1 tablet (40 mg total) by mouth daily. 06/01/12  Yes Jacques Navy, MD  oxyCODONE-acetaminophen (PERCOCET) 10-325 MG per tablet Take 2 tablets by mouth every 6 (six) hours as needed for pain. May fill on or after 08/23/12. 08/23/12  Yes Jacques Navy, MD  rosuvastatin (CRESTOR) 10 MG tablet Take 1 tablet (10 mg total) by mouth daily. 08/17/12  Yes Jacques Navy, MD   Family History Family History  Problem Relation Age of Onset  . Cancer Brother     Colon  . Coronary artery disease Neg Hx   . Heart attack Neg Hx     Social History History   Social History  . Marital Status: Widowed    Spouse Name: james    Number of Children: 3  . Years of Education: 12   Occupational History  . CASHIER    Social History Main Topics  . Smoking status: Never Smoker   . Smokeless tobacco: Never Used  . Alcohol Use: No  . Drug Use: No  . Sexually Active: No   Other Topics Concern  . Not on file   Social History Narrative   HSG Married- '62 widowed '13. 2 sons- '63, '66; one daughter-'69; 11 grandchildren, 4 great grandchildren. Work- Warehouse manager Work, Conservation officer, nature. Production designer, theatre/television/film.          Review of Systems  General:  No chills, fever, night sweats or weight changes.  Cardiovascular:  Positive chest pain with dyspnea, no edema, orthopnea, palpitations, paroxysmal nocturnal dyspnea. Dermatological: No rash, lesions/masses Respiratory: No cough, dyspnea Urologic: No hematuria, dysuria Abdominal:   She has had abdominal discomfort and nausea without vomiting, diarrhea, bright red blood per rectum, melena, or hematemesis Neurologic:  No visual changes, wkns, changes in mental status. All other systems reviewed and are otherwise negative except as noted above.  Physical Exam  Blood pressure 112/49, pulse 74, temperature 97.4 F (36.3 C), temperature source Oral, resp. rate 13, height 5\' 8"  (1.727 m), weight 219 lb 5.7 oz (99.5 kg), SpO2 99.00%.  General: Pleasant, NAD Psych:  Normal affect. Neuro: Alert and oriented X 3. Moves all extremities spontaneously. HEENT: Normal  Neck: Supple without bruits or JVD. Lungs:  Resp regular and unlabored, CTA. Heart: RRR no s3, s4, soft systolic murmur at the right upper sternal border. Abdomen: Soft, non-tender, non-distended, BS + x 4.  Extremities: No clubbing, cyanosis or edema. DP/PT/Radials 2+ and equal bilaterally.  Labs  Trop i, poc 0.00  Lab Results  Component Value Date   WBC 6.3 10/19/2012   HGB 13.8 10/19/2012   HCT 39.9 10/19/2012   MCV 89.7 10/19/2012   PLT 159 10/19/2012    Recent Labs Lab 10/19/12 0748 10/19/12 0837  NA 130*  --   K 4.3  --   CL 94*  --   CO2 22  --   BUN 18 18  CREATININE 0.87  --   CALCIUM 9.0  --   PROT 6.9  --  BILITOT 0.3  --   ALKPHOS 90  --   ALT 14  --   AST 21  --   GLUCOSE 519*  --    Lab Results  Component Value Date   CHOL 191 08/17/2012   HDL 35.30* 08/17/2012   LDLCALC  Value: UNABLE TO CALCULATE IF TRIGLYCERIDE OVER 400 mg/dL        Total Cholesterol/HDL:CHD Risk Coronary Heart Disease Risk Table                     Men   Women  1/2 Average Risk   3.4   3.3  Average Risk       5.0   4.4  2 X Average Risk   9.6   7.1  3 X Average Risk  23.4   11.0        Use the calculated Patient Ratio above and the CHD Risk Table to determine the patient's CHD Risk.        ATP III CLASSIFICATION (LDL):  <100     mg/dL   Optimal  454-098  mg/dL   Near or Above                    Optimal  130-159  mg/dL   Borderline  119-147  mg/dL   High  >829     mg/dL   Very High 5/62/1308   TRIG 547.0 Triglyceride is over 400; calculations on Lipids are invalid.* 08/17/2012   Radiology/Studies  Dg Chest 2 View  10/19/2012   *RADIOLOGY REPORT*  Clinical Data: Abdominal pain, hyperglycemia  CHEST - 2 VIEW   IMPRESSION: Streaky left basilar atelectasis or infiltrate.  No pulmonary edema.   Original Report Authenticated By: Natasha Mead, M.D.   Ct Angio Chest Pe W/cm &/or Wo Cm  10/19/2012   *RADIOLOGY  REPORT*  Clinical Data: Pain, dyspnea, possible pulmonary embolus  CT ANGIOGRAPHY CHEST   IMPRESSION:  1.  No pulmonary embolus is noted. 2.  Degenerative changes thoracic spine.  No acute infiltrate or pulmonary edema. 3.  Mitral valve calcifications.  Coronary artery calcifications are noted.   Original Report Authenticated By: Natasha Mead, M.D.   ECG  Regular sinus rhythm, 97, poor R-wave progression and inferior infarct  ASSESSMENT AND PLAN  1. Unstable angina/CAD: Patient has a six-month history of progressive rest and exertional substernal chest discomfort that was worse this morning and associated with dyspnea and radiation to her left arm. Symptoms resolved with nitroglycerin and morphine. She's currently pain-free. Initial troponin is normal while ECG shows more pronounced loss of R-wave progression then what was seen on prior ECG. With history of nonobstructive CAD in 2001 and ongoing poorly controlled diabetes, we'll plan to perform diagnostic catheterization on Friday, June 6. Would like to see her blood sugars better controlled between now and then. We will obtain a 2-D echocardiogram tomorrow. Continue aspirin, beta blocker, ARB, and statin therapy.  2. Poorly controlled diabetes mellitus: Management per internal medicine. She reports compliance with medications at home and also tries to watch her diet.  3. Hypertension: Currently stable. She bottomed out her blood pressure in the ED S. receiving morphine and nitroglycerin. Beta blocker was added to her regimen and we will reduce her ARB dose to ensure that she doesn't bottom out again.  4. Hyperlipidemia/hypertriglyceridemia: Continue fiber and statin.  Signed, Nicolasa Ducking, NP 10/19/2012, 4:41 PM  Patient examined Chart reviewed Discussed with PA  Symptoms concerning for angina.  Add low dose beta blocker.  Continue ARB for BP  Needs BS under better control No ketones in urine.  Discussed cath on Friday with patient She is  agreeable.  Loss of R wave in precordium but no big rise in troponin  Echo in am   Charlton Haws

## 2012-10-19 NOTE — ED Provider Notes (Signed)
History     CSN: [id number]  Arrival date & time 10/19/12  2536   First MD Initiated Contact with Patient 10/19/12 [phone]      Chief Complaint  Patient presents with  . Hyperglycemia  . Generalized Body Aches  . Abdominal Pain  . Chest Pain    (Consider location/radiation/quality/duration/timing/severity/associated sxs/prior treatment) Patient is a 72 y.o. female presenting with hyperglycemia, abdominal pain, and chest pain. The history is provided by the patient. No language interpreter was used.  Hyperglycemia Associated symptoms: abdominal pain, chest pain, nausea and shortness of breath   Associated symptoms: no dysuria, no fever and no vomiting   Abdominal Pain Associated symptoms include abdominal pain, chest pain and nausea. Pertinent negatives include no chills, coughing, fever or vomiting.  Chest Pain Associated symptoms: abdominal pain, back pain, nausea and shortness of breath   Associated symptoms: no cough, no fever and not vomiting   Pt is a 72yo female hx of diabetes and HTN c/o gradual onset left sided chest pain that is described as a "pressure" sensation, 7/10, radiating down left arm and to upper back.  Upper back pain is achy, gradual onset, 7/10.  Pt also c/o "hard" generalized abdominal pain that also started this morning, 8/10 making pt nauseated and causing SOB.  Nothing makes symptoms better or worse.  Pt denies fever, vomiting, or diarrhea.  Reports feeling well until this morning when she woke up.  Pt also reports home glucose of 500, nl per pt is in 200s. Denies new medications.  Denies previous cardiac or pulmonary problems.   Past Medical History  Diagnosis Date  . Osteoarthritis of spine   . Hyperlipemia   . Hypertension   . Diabetes mellitus, type 2   . Depressive disorder     Past Surgical History  Procedure Laterality Date  . Gallbladder surgery  1968  . Appendectomy  1959  . Combined hysterectomy abdominal w/ a&p repair / oophorectomy  1968   . Multiple d&c    . Tonsillectomy  age 24    Family History  Problem Relation Age of Onset  . Cancer Brother     Colon  . Coronary artery disease Neg Hx   . Heart attack Neg Hx     History  Substance Use Topics  . Smoking status: Never Smoker   . Smokeless tobacco: Never Used  . Alcohol Use: No    OB History   Grav Para Term Preterm Abortions TAB SAB Ect Mult Living                  Review of Systems  Constitutional: Negative for fever and chills.  Respiratory: Positive for shortness of breath. Negative for cough and wheezing.   Cardiovascular: Positive for chest pain.  Gastrointestinal: Positive for nausea and abdominal pain. Negative for vomiting, diarrhea and constipation.  Genitourinary: Negative for dysuria.  Musculoskeletal: Positive for back pain.  All other systems reviewed and are negative.    Allergies  Review of patient's allergies indicates no known allergies.  Home Medications   Current Outpatient Rx  Name  Route  Sig  Dispense  Refill  . amitriptyline (ELAVIL) 150 MG tablet   Oral   Take 1 tablet (150 mg total) by mouth at bedtime.   30 tablet   11     PT NEEDS TO SCHEDULE PHYSICAL EXAM FOR FURTHER REF ...   . Cholecalciferol (VITAMIN D3) 1000 UNITS CAPS   Oral   Take by mouth daily.           Marland Kitchen  fenofibrate (TRICOR) 145 MG tablet   Oral   Take 1 tablet (145 mg total) by mouth daily.   30 tablet   11   . ferrous gluconate (FERGON) 325 MG tablet   Oral   Take 325 mg by mouth daily with breakfast.           . gabapentin (NEURONTIN) 300 MG capsule   Oral   Take 1 capsule (300 mg total) by mouth at bedtime.   30 capsule   11   . Garlic Oil 500 MG CAPS   Oral   Take by mouth daily.           . insulin aspart (NOVOLOG) 100 UNIT/ML injection   Subcutaneous   Inject 10 Units into the skin 3 (three) times daily before meals.   3 pen   PRN   . insulin glargine (LANTUS SOLOSTAR) 100 UNIT/ML injection   Subcutaneous   Inject  50 Units into the skin at bedtime. titrate up the dose as instructed   5 pen   PRN   . Insulin Pen Needle 31G X 8 MM MISC   Does not apply   1 Device by Does not apply route daily.   30 each   11   . meclizine (ANTIVERT) 25 MG tablet   Oral   Take 25 mg by mouth 3 (three) times daily as needed.         . metFORMIN (GLUMETZA) 1000 MG (MOD) 24 hr tablet   Oral   Take 1 tablet (1,000 mg total) by mouth 2 (two) times daily with a meal.   60 tablet   11     PT NEEDS TO SCHEDULE A FOLLOW UP APPT   . olmesartan (BENICAR) 40 MG tablet   Oral   Take 1 tablet (40 mg total) by mouth daily.   30 tablet   11   . oxyCODONE-acetaminophen (PERCOCET) 10-325 MG per tablet   Oral   Take 2 tablets by mouth every 6 (six) hours as needed for pain. May fill on or after 08/23/12.   40 tablet   0   . rosuvastatin (CRESTOR) 10 MG tablet   Oral   Take 1 tablet (10 mg total) by mouth daily.   30 tablet   11     BP 89/51  Pulse 84  Temp(Src) 97.9 F (36.6 C) (Oral)  Resp 14  Ht 5\' 8"  (1.727 m)  Wt 200 lb (90.719 kg)  BMI 30.42 kg/m2  SpO2 95%  Physical Exam  Nursing note and vitals reviewed. Constitutional: She appears well-developed and well-nourished. No distress.  HENT:  Head: Normocephalic and atraumatic.  Eyes: Conjunctivae are normal. No scleral icterus.  Neck: Normal range of motion.  Neck, soft and supple  Cardiovascular: Normal rate, regular rhythm and normal heart sounds.   Pulmonary/Chest: Effort normal. No respiratory distress. She has wheezes. She has rhonchi in the right lower field and the left lower field. She exhibits no tenderness.  Bibasilar crackles and wheezes  Abdominal: Soft. Bowel sounds are normal. She exhibits no distension and no mass. There is tenderness (diffuse). There is no rebound and no guarding.  Musculoskeletal: Normal range of motion.  Neurological: She is alert.  Skin: Skin is warm and dry. She is not diaphoretic.    ED Course  Procedures  (including critical care time)  Labs Reviewed  COMPREHENSIVE METABOLIC PANEL - Abnormal; Notable for the following:    Sodium 130 (*)    Chloride 94 (*)  Glucose, Bld 519 (*)    GFR calc non Af Amer 66 (*)    GFR calc Af Amer 77 (*)    All other components within normal limits  GLUCOSE, CAPILLARY - Abnormal; Notable for the following:    Glucose-Capillary 463 (*)    All other components within normal limits  URINALYSIS, ROUTINE W REFLEX MICROSCOPIC - Abnormal; Notable for the following:    APPearance CLOUDY (*)    Specific Gravity, Urine 1.044 (*)    Glucose, UA >1000 (*)    Leukocytes, UA SMALL (*)    All other components within normal limits  GLUCOSE, CAPILLARY - Abnormal; Notable for the following:    Glucose-Capillary 357 (*)    All other components within normal limits  URINE MICROSCOPIC-ADD ON - Abnormal; Notable for the following:    Squamous Epithelial / LPF MANY (*)    Bacteria, UA FEW (*)    All other components within normal limits  URINE CULTURE  CBC  BUN  POCT I-STAT TROPONIN I   Dg Chest 2 View  10/19/2012   *RADIOLOGY REPORT*  Clinical Data: Abdominal pain, hyperglycemia  CHEST - 2 VIEW  Comparison: 05/28/2010  Findings: Cardiomediastinal silhouette is stable.  Mild thoracic dextroscoliosis.  Degenerative changes thoracic spine. Degenerative changes bilateral AC joints.  There is streaky left basilar atelectasis or infiltrate.  No pulmonary edema.  IMPRESSION: Streaky left basilar atelectasis or infiltrate.  No pulmonary edema.   Original Report Authenticated By: Natasha Mead, M.D.   Ct Angio Chest Pe W/cm &/or Wo Cm  10/19/2012   *RADIOLOGY REPORT*  Clinical Data: Pain, dyspnea, possible pulmonary embolus  CT ANGIOGRAPHY CHEST  Technique:  Multidetector CT imaging of the chest using the standard protocol during bolus administration of intravenous contrast. Multiplanar reconstructed images including MIPs were obtained and reviewed to evaluate the vascular anatomy.   Contrast: OMNIPAQUE IOHEXOL 350 MG/ML SOLN  Comparison: None.  Findings: Central airways are patent.  Images of the thoracic inlet are unremarkable.  Heart size within normal limits.  No pericardial effusion.  Visualized upper abdomen is unremarkable.  No aortic aneurysm.  Mitral valve calcifications are noted. Atherosclerotic calcifications of the coronary arteries.  No hilar or mediastinal adenopathy.  No pulmonary embolus is identified.  Images of the lung parenchyma shows no acute infiltrate or pulmonary edema.  Sagittal images of the spine shows degenerative changes thoracic spine.  Sagittal view of the sternum is unremarkable.  IMPRESSION:  1.  No pulmonary embolus is noted. 2.  Degenerative changes thoracic spine.  No acute infiltrate or pulmonary edema. 3.  Mitral valve calcifications.  Coronary artery calcifications are noted.   Original Report Authenticated By: Natasha Mead, M.D.    Date: 10/19/2012  Rate: 97  Rhythm: normal sinus rhythm  QRS Axis: normal  Intervals: normal  ST/T Wave abnormalities: normal  Conduction Disutrbances:none  Narrative Interpretation:   Old EKG Reviewed: unchanged     1. Chest pain       MDM  Pt c/o chest pain, pt also hypotensive and SOB.  Need to r/o PE, MI, CHF, pneumonia, unstable angina.  Obtaining labs: CBC, CMP, BUN, troponin, UA, EKG, and CXR.  Discussed pt with Dr. Freida Busman who reevaluated pt, will likely admit pt for unstable angina.  Starting pt on fluids, zofran, morphine, and insulin.   EKG: no STEMI, unchanged CXR: streaky left basilar atelectasis or infiltrate, no pulmonary edema Troponin: neg  Will order CT chest, bigger concern for PE  CBC: unremarkable CMP:  mild hyponatremia, Glucose-519, started pt on insulin sliding scale.  Glucose: 463 BUN: 18, WNL UA: mild leukocytosis, many epithelial cells, not a clean catch. Will have urine cultured.  CT Chest: no pulmonary embolus noted.  Mitral valve calcifications, coronary artery  calcifications   Tx in ED: fluids, morphine, and zofran.  Pt still in pain.  Pt given nitro to help relieve pain.    Dr. Freida Busman consulted Dr. Gwenlyn Perking, hospitalist, who agreed to admit pt to tele observation. Concern for unstable angina.           Junius Finner, PA-C 10/19/12 1245

## 2012-10-19 NOTE — ED Notes (Signed)
CBG of 357 completed

## 2012-10-20 ENCOUNTER — Encounter (HOSPITAL_COMMUNITY)
Admission: EM | Disposition: A | Discharge status: Home / Self Care | Payer: Self-pay | Source: Home / Self Care | Attending: Internal Medicine

## 2012-10-20 DIAGNOSIS — I517 Cardiomegaly: Secondary | ICD-10-CM

## 2012-10-20 DIAGNOSIS — M25569 Pain in unspecified knee: Secondary | ICD-10-CM

## 2012-10-20 DIAGNOSIS — I2510 Atherosclerotic heart disease of native coronary artery without angina pectoris: Secondary | ICD-10-CM

## 2012-10-20 DIAGNOSIS — E119 Type 2 diabetes mellitus without complications: Secondary | ICD-10-CM

## 2012-10-20 DIAGNOSIS — M1990 Unspecified osteoarthritis, unspecified site: Secondary | ICD-10-CM

## 2012-10-20 DIAGNOSIS — R079 Chest pain, unspecified: Secondary | ICD-10-CM

## 2012-10-20 HISTORY — PX: LEFT HEART CATHETERIZATION WITH CORONARY ANGIOGRAM: SHX5451

## 2012-10-20 LAB — LIPID PANEL
Cholesterol: 203 mg/dL — ABNORMAL HIGH (ref 0–200)
HDL: 32 mg/dL — ABNORMAL LOW (ref >39)
LDL Cholesterol: UNDETERMINED mg/dL (ref 0–99)
Total CHOL/HDL Ratio: 6.3 RATIO
Triglycerides: 675 mg/dL — ABNORMAL HIGH (ref <150)
VLDL: UNDETERMINED mg/dL (ref 0–40)

## 2012-10-20 LAB — TROPONIN I
Troponin I: <0.3 ng/mL (ref <0.30)
Troponin I: <0.3 ng/mL (ref <0.30)

## 2012-10-20 LAB — CBC
HCT: 39.9 % (ref 36.0–46.0)
Hemoglobin: 13.6 g/dL (ref 12.0–15.0)
MCH: 30.9 pg (ref 26.0–34.0)
MCHC: 34.1 g/dL (ref 30.0–36.0)
MCV: 90.7 fL (ref 78.0–100.0)
Platelets: 170 10*3/uL (ref 150–400)
RBC: 4.4 MIL/uL (ref 3.87–5.11)
RDW: 12.8 % (ref 11.5–15.5)
WBC: 6.4 10*3/uL (ref 4.0–10.5)

## 2012-10-20 LAB — URINE CULTURE
Colony Count: NO GROWTH
Culture: NO GROWTH

## 2012-10-20 LAB — GLUCOSE, CAPILLARY
Glucose-Capillary: 243 mg/dL — ABNORMAL HIGH (ref 70–99)
Glucose-Capillary: 199 mg/dL — ABNORMAL HIGH (ref 70–99)
Glucose-Capillary: 112 mg/dL — ABNORMAL HIGH (ref 70–99)
Glucose-Capillary: 231 mg/dL — ABNORMAL HIGH (ref 70–99)

## 2012-10-20 LAB — HEMOGLOBIN A1C
Hgb A1c MFr Bld: 11.1 % — ABNORMAL HIGH (ref <5.7)
Mean Plasma Glucose: 272 mg/dL — ABNORMAL HIGH (ref <117)

## 2012-10-20 LAB — COMPREHENSIVE METABOLIC PANEL
ALT: 20 U/L (ref 0–35)
AST: 18 U/L (ref 0–37)
Albumin: 3.5 g/dL (ref 3.5–5.2)
Alkaline Phosphatase: 87 U/L (ref 39–117)
BUN: 11 mg/dL (ref 6–23)
CO2: 24 mEq/L (ref 19–32)
Calcium: 8.9 mg/dL (ref 8.4–10.5)
Chloride: 101 mEq/L (ref 96–112)
Creatinine, Ser: 0.57 mg/dL (ref 0.50–1.10)
GFR calc Af Amer: >90 mL/min (ref >90)
GFR calc non Af Amer: >90 mL/min (ref >90)
Glucose, Bld: 240 mg/dL — ABNORMAL HIGH (ref 70–99)
Potassium: 4.6 mEq/L (ref 3.5–5.1)
Sodium: 135 mEq/L (ref 135–145)
Total Bilirubin: 0.4 mg/dL (ref 0.3–1.2)
Total Protein: 6.8 g/dL (ref 6.0–8.3)

## 2012-10-20 LAB — TSH: TSH: 1.572 u[IU]/mL (ref 0.350–4.500)

## 2012-10-20 LAB — PROTIME-INR
INR: 0.93 (ref 0.00–1.49)
Prothrombin Time: 12.4 seconds (ref 11.6–15.2)

## 2012-10-20 LAB — POCT ACTIVATED CLOTTING TIME: Activated Clotting Time: 453 seconds

## 2012-10-20 SURGERY — LEFT HEART CATHETERIZATION WITH CORONARY ANGIOGRAM
Anesthesia: LOCAL

## 2012-10-20 MED ORDER — SODIUM CHLORIDE 0.9 % IJ SOLN
3.0000 mL | Freq: Two times a day (BID) | INTRAMUSCULAR | Status: DC
  Administered 2012-10-20: 3 mL via INTRAVENOUS
  Administered 2012-10-21: 09:00:00 via INTRAVENOUS

## 2012-10-20 MED ORDER — HEPARIN SODIUM (PORCINE) 1000 UNIT/ML IJ SOLN
INTRAMUSCULAR | Status: AC
  Filled 2012-10-20: qty 1

## 2012-10-20 MED ORDER — TICAGRELOR 90 MG PO TABS
ORAL_TABLET | ORAL | Status: AC
  Filled 2012-10-20: qty 2

## 2012-10-20 MED ORDER — FENTANYL CITRATE 0.05 MG/ML IJ SOLN
INTRAMUSCULAR | Status: AC
  Filled 2012-10-20: qty 2

## 2012-10-20 MED ORDER — HEPARIN (PORCINE) IN NACL 2-0.9 UNIT/ML-% IJ SOLN
INTRAMUSCULAR | Status: AC
  Filled 2012-10-20: qty 1000

## 2012-10-20 MED ORDER — SODIUM CHLORIDE 0.9 % IV SOLN: 250.0000 mL | INTRAVENOUS | Status: DC | PRN

## 2012-10-20 MED ORDER — SODIUM CHLORIDE 0.9 % IJ SOLN: 3.0000 mL | INTRAMUSCULAR | Status: DC | PRN

## 2012-10-20 MED ORDER — SODIUM CHLORIDE 0.9 % IV SOLN: 1.0000 mL/kg/h | INTRAVENOUS | Status: AC

## 2012-10-20 MED ORDER — MIDAZOLAM HCL 5 MG/5ML IJ SOLN
INTRAMUSCULAR | Status: AC
  Filled 2012-10-20: qty 5

## 2012-10-20 MED ORDER — LIVING WELL WITH DIABETES BOOK
Freq: Once | Status: AC
  Administered 2012-10-20: 1
  Filled 2012-10-20: qty 1

## 2012-10-20 MED ORDER — ATORVASTATIN CALCIUM 40 MG PO TABS
40.0000 mg | ORAL_TABLET | Freq: Every day | ORAL | Status: DC
  Administered 2012-10-20: 40 mg via ORAL
  Filled 2012-10-20 (×2): qty 1

## 2012-10-20 MED ORDER — TICAGRELOR 90 MG PO TABS
90.0000 mg | ORAL_TABLET | Freq: Two times a day (BID) | ORAL | Status: DC
  Administered 2012-10-20 – 2012-10-21 (×2): 90 mg via ORAL
  Filled 2012-10-20 (×3): qty 1

## 2012-10-20 MED ORDER — SODIUM CHLORIDE 0.9 % IV SOLN: 1.0000 mL/kg/h | INTRAVENOUS | Status: DC

## 2012-10-20 MED ORDER — SODIUM CHLORIDE 0.9 % IJ SOLN
3.0000 mL | Freq: Two times a day (BID) | INTRAMUSCULAR | Status: DC
  Administered 2012-10-20 (×2): 3 mL via INTRAVENOUS

## 2012-10-20 MED ORDER — ASPIRIN 81 MG PO CHEW: 324.0000 mg | CHEWABLE_TABLET | ORAL | Status: AC

## 2012-10-20 MED ORDER — VERAPAMIL HCL 2.5 MG/ML IV SOLN
INTRAVENOUS | Status: AC
  Filled 2012-10-20: qty 4

## 2012-10-20 MED ORDER — LIDOCAINE HCL (PF) 1 % IJ SOLN
INTRAMUSCULAR | Status: AC
  Filled 2012-10-20: qty 30

## 2012-10-20 MED ORDER — BIVALIRUDIN 250 MG IV SOLR
INTRAVENOUS | Status: AC
  Filled 2012-10-20: qty 250

## 2012-10-20 MED ORDER — ATORVASTATIN CALCIUM 80 MG PO TABS
80.0000 mg | ORAL_TABLET | Freq: Every day | ORAL | Status: DC
  Filled 2012-10-20: qty 1

## 2012-10-20 NOTE — ED Provider Notes (Signed)
Medical screening examination/treatment/procedure(s) were conducted as a shared visit with non-physician practitioner(s) and myself.  I personally evaluated the patient during the encounter  Toy Baker, MD 10/20/12 1530

## 2012-10-20 NOTE — Progress Notes (Signed)
Subjective: Cynthia Singh is admitted with unstable angina. She gives a history of progressive chest pain over several months that got worse over the past several days leading to her presentation to the ED. On exam she was uncomfortable and a bit hypotensive. Initial studies did not suggest ACS: negative enzymes w/o acute change on EKG. Risk factors include weight, post-menopausal, hyperlipidemic and a very poorly controlled diabetic. She is admitted for R/O and further evaluation.  This morning her main c/o is headache.She denies any significant c/p.  Objective: Lab:  Recent Labs  10/19/12 0748 10/19/12 1944  WBC 6.3 7.8  HGB 13.8 13.2  HCT 39.9 38.9  MCV 89.7 89.2  PLT 159 162    Recent Labs  10/19/12 0748 10/19/12 0837 10/19/12 1944  NA 130*  --   --   K 4.3  --   --   CL 94*  --   --   GLUCOSE 519*  --   --   BUN 18 18  --   CREATININE 0.87  --  0.70  CALCIUM 9.0  --   --    Cardiac Panel (last 3 results)  Recent Labs  10/19/12 1945 10/20/12 0030  TROPONINI <0.30 <0.30   A1C 11.4%  Imaging: CT Angio chest 6/4: IMPRESSION:  1. No pulmonary embolus is noted.  2. Degenerative changes thoracic spine. No acute infiltrate or  pulmonary edema.  3. Mitral valve calcifications. Coronary artery calcifications  are noted.  Scheduled Meds: . amitriptyline  150 mg Oral QHS  . aspirin EC  81 mg Oral Daily  . atorvastatin  20 mg Oral q1800  . carvedilol  3.125 mg Oral BID WC  . cholecalciferol  1,000 Units Oral Daily  . fenofibrate  160 mg Oral Daily  . ferrous gluconate  325 mg Oral Q breakfast  . gabapentin  300 mg Oral QHS  . heparin  5,000 Units Subcutaneous Q8H  . insulin aspart  0-15 Units Subcutaneous TID WC  . insulin glargine  35 Units Subcutaneous QHS  . irbesartan  75 mg Oral Daily  . pantoprazole  40 mg Oral Q1200   Continuous Infusions: . sodium chloride 1,000 mL (10/20/12 0523)   PRN Meds:.acetaminophen, acetaminophen, meclizine, morphine injection,  ondansetron (ZOFRAN) IV, ondansetron, oxyCODONE, oxyCODONE-acetaminophen   Physical Exam: Filed Vitals:   10/20/12 0500  BP: 141/67  Pulse: 79  Temp: 99.3 F (37.4 C)  Resp: 16    Intake/Output Summary (Last 24 hours) at 10/20/12 0711 Last data filed at 10/20/12 1610  Gross per 24 hour  Intake   2285 ml  Output   3025 ml  Net   -740 ml   Gen'l - overweight white woman in no acute distress. HEENT - C&S clear Cor- 2+ radial, RRR Pulm - normal respirations Abd- soft, BS+ Neuro - A&O x 3      Assessment/Plan: 1. Cardiac - high risk patient with unstable angina. Trop I neg x 4, EKG w/o acute changes. Plan Per cardiology - 2D echo today; for cath tomorrow  2. Diabetes - on-going struggle. She has been on a basal insulin + metformin regimen and has had counseling and education with poor results Plan Sliding scale while in-patient  Diabetes education while in-patient  Continued out-patient follow-up.  3. Lipids - good control with LDL 88.7 on Crestor 10 mg Plan - as outpatient will increase crestor to 20 mg.   4. Ortho - end-stage DJD knees but surgery has been delayed for a long time due  to poor diabetes control. Plan  Continue percocet for pain control and to avoid opioid withdrawal.   Cynthia Singh IM (o) [phone]; (c) [phone] Call-grp - Patsi Sears IM  Tele: [phone]  10/20/2012, 7:08 AM

## 2012-10-20 NOTE — Progress Notes (Deleted)
Report from Night RN. Chart reviewed together. Handoff complete.Introductions complete. Will continue to monitor and advise attending as needed.   

## 2012-10-20 NOTE — Progress Notes (Signed)
Inpatient Diabetes Program Recommendations  AACE/ADA: New Consensus Statement on Inpatient Glycemic Control (2013)  Target Ranges:  Prepandial:   less than 140 mg/dL      Peak postprandial:   less than 180 mg/dL (1-2 hours)      Critically ill patients:  140 - 180 mg/dL   Reason for Visit: Spoke to patient regarding home diabetes management.  She states that she is taking Lantus 50 units q HS, Glumetza 1000 mg bid and Novolog 10 units with meals.  She reports that she does not miss insulin doses however CBG's continue to stay in the 200's. She checks CBG's twice a day. She has never had outpatient diabetes education and is interested in attending session with CDE.  Patient likely needs titration of insulin doses for better control.    Will order Outpatient diabetes education per protocol/cosign required.  Also will order "Living well with Diabetes" booklet.  Once eating, please order Novolog meal coverage 5 units tid with meals.  Will follow.

## 2012-10-20 NOTE — CV Procedure (Signed)
  Cardiac Catheterization Procedure Note  Name: Cynthia Singh MRN: [id number] DOB: [birth year]  Procedure: Left Heart Cath, Selective Coronary Angiography, LV angiography  Indication:  Unstable angina (Class IV)  Procedural details: The right groin was prepped, draped, and anesthetized with 1% lidocaine. Using modified Seldinger technique, a 5 French sheath was introduced into the right femoral artery. Standard Judkins catheters were used for coronary angiography and left ventriculography. Catheter exchanges were performed over a guidewire. There were no immediate procedural complications. The patient was transferred to the post catheterization recovery area for further monitoring.  Procedural Findings:  Hemodynamics:     AO 174/82    LV 171/22   Coronary angiography:   Coronary dominance: Right  Left mainstem:   Ostial long 25% stenosis.   Left anterior descending (LAD):   Large and wraps the apex.  Diffuse luminal irregularities.  Mid long 30% stenosis at diagonal.  D1 is moderated sized with ostial 60% stenosis.    Left circumflex (LCx):  AV groove diffuse mild luminal irregularities.    Right coronary artery (RCA):  Large and dominant.  Proximal 50%.  Mid 95%, 40% mid stenosis, 40% after the PDA.  PDA large and free of high grade disease.  PL x 2 small to moderate and free of high grade disease.   Left ventriculography: Left ventricular systolic function is normal, LVEF is estimated at 65%, there is no significant mitral regurgitation   Final Conclusions:  Severe single vessel CAD with Class IV angina.  Recommendations: Plan PCI.   Rollene Rotunda 10/20/2012, 3:25 PM

## 2012-10-20 NOTE — CV Procedure (Signed)
   CARDIAC CATH NOTE  Name: Cynthia Singh MRN: [id number] DOB: 1940-10-25  Procedure: PTCA and stenting of the mid-RCA  Indication: 72 year-old diabetic woman with unstable angina pectoris. She underwent cardiac cath by Dr Antoine Poche. This demonstrated critical stenosis (95%) of the mid-RCA and moderate stenosis of the LAD/diagonal bifucation. She has diffuse diabetic disease, but clearly her 'culprit' vessel is the RCA. After careful review of options, we elected to proceed with PCI of the RCA and med Rx for her residual CAD.  Procedural Details: There was an indwelling 5 Fr radial sheath. This was upsized to a 6 [place].  Weight-based bivalirudin was given for anticoagulation. Once a therapeutic ACT was achieved, a 6 [place] AR-2 guide catheter was inserted.  I couldn't get this to engage the RCA. This was changed out for a 5Fr JR4 which reached the vessel with an 0.035 wire in the guide to help straighten the subclavian. A BMW coronary guidewire was used to cross the lesion.  The lesion was predilated with a 2.5x15 mm balloon.  The lesion was then stented with a 2.75x32 mm Promus Premier drug-eluting stent.  The stent was postdilated with a 3.0 mm noncompliant balloon.  Following PCI, there was 0% residual stenosis and TIMI-3 flow. Final angiography confirmed an excellent result. The patient tolerated the procedure well. There were no immediate procedural complications. A TR band was used for radial hemostasis. The patient was transferred to the post catheterization recovery area for further monitoring.  Lesion Data: Vessel: RCA/mid Percent stenosis (pre): 95 TIMI-flow (pre):  3 Stent:  2.75x32 mm Promus Premier DES Percent stenosis (post): 0 TIMI-flow (post): 3  Conclusions: Successful PCI of the RCA.  Recommendations: ASA/Brilinta x 12 months, aggressive med Rx.  Tonny Bollman 10/20/2012, 4:46 PM

## 2012-10-20 NOTE — Progress Notes (Signed)
Report from Night RN. Chart reviewed together. Handoff complete.Introductions complete. Will continue to monitor and advise attending as needed.   

## 2012-10-20 NOTE — Care Management Note (Addendum)
    Page 1 of 1   10/21/2012     2:28:32 PM   CARE MANAGEMENT NOTE 10/21/2012  Patient:  Cynthia Singh, Cynthia Singh   Account Number:  [id number]  Date Initiated:  10/20/2012  Documentation initiated by:  Junius Creamer  Subjective/Objective Assessment:   adm w angina, hyperglycemia     Action/Plan:   lives w fam, pcp dr m norins   Anticipated DC Date:     Anticipated DC Plan:        DC Planning Services  CM consult      Choice offered to / List presented to:             Status of service:   Medicare Important Message given?   (If response is "NO", the following Medicare IM given date fields will be blank) Date Medicare IM given:   Date Additional Medicare IM given:    Discharge Disposition:    Per UR Regulation:  Reviewed for med. necessity/level of care/duration of stay  If discussed at Long Length of Stay Meetings, dates discussed:    Comments:  6/6  1427p debbie Kemi Gell rn,bsn pt has been given 30day free and copay assist card for brilinta. pt has 45.00 per month copay.

## 2012-10-20 NOTE — Progress Notes (Signed)
Patient ID: Cynthia Singh, female   DOB: 28-Sep-1940, 72 y.o.   MRN: [id number]   Patient Name: Cynthia Singh Date of Encounter: 10/20/2012    SUBJECTIVE  No chest pain this morning. Complains of headache. Not on IV nitroglycerin blood sugars coming down  CURRENT MEDS . amitriptyline  150 mg Oral QHS  . aspirin EC  81 mg Oral Daily  . atorvastatin  80 mg Oral q1800  . carvedilol  3.125 mg Oral BID WC  . cholecalciferol  1,000 Units Oral Daily  . fenofibrate  160 mg Oral Daily  . ferrous gluconate  325 mg Oral Q breakfast  . gabapentin  300 mg Oral QHS  . heparin  5,000 Units Subcutaneous Q8H  . insulin aspart  0-15 Units Subcutaneous TID WC  . insulin glargine  35 Units Subcutaneous QHS  . irbesartan  75 mg Oral Daily  . pantoprazole  40 mg Oral Q1200    OBJECTIVE  Filed Vitals:   10/20/12 0100 10/20/12 0200 10/20/12 0500 10/20/12 0740  BP:   141/67   Pulse: 68 79 79   Temp:   99.3 F (37.4 C) 97.7 F (36.5 C)  TempSrc:   Oral Oral  Resp: 16 15 16    Height:      Weight:      SpO2: 97% 96% 98%     Intake/Output Summary (Last 24 hours) at 10/20/12 0748 Last data filed at 10/20/12 0747  Gross per 24 hour  Intake   2285 ml  Output   3575 ml  Net  -1290 ml   Filed Weights   10/19/12 0817 10/19/12 1400  Weight: 200 lb (90.719 kg) 219 lb 5.7 oz (99.5 kg)    PHYSICAL EXAM  General: Pleasant, NAD. Neuro: Alert and oriented X 3. Moves all extremities spontaneously. Psych: Normal affect. HEENT:  Normal  Neck: Supple without bruits or JVD. Lungs:  Resp regular and unlabored, CTA. Heart: RRR no s3, s4, or murmurs. Abdomen: Soft, non-tender, non-distended, BS + x 4.  Extremities: No clubbing, cyanosis or edema. DP/PT/Radials 2+ and equal bilaterally.  Accessory Clinical Findings  CBC  Recent Labs  10/19/12 0748 10/19/12 1944  WBC 6.3 7.8  HGB 13.8 13.2  HCT 39.9 38.9  MCV 89.7 89.2  PLT 159 162   Basic Metabolic Panel  Recent Labs   40/98/11 0748 10/19/12 0837 10/19/12 1944  NA 130*  --   --   K 4.3  --   --   CL 94*  --   --   CO2 22  --   --   GLUCOSE 519*  --   --   BUN 18 18  --   CREATININE 0.87  --  0.70  CALCIUM 9.0  --   --    Liver Function Tests  Recent Labs  10/19/12 0748  AST 21  ALT 14  ALKPHOS 90  BILITOT 0.3  PROT 6.9  ALBUMIN 3.7    Recent Labs  10/19/12 0837  LIPASE 20   Cardiac Enzymes  Recent Labs  10/19/12 1945 10/20/12 0030  TROPONINI <0.30 <0.30   BNP No components found with this basename: POCBNP,  D-Dimer No results found for this basename: DDIMER,  in the last 72 hours Hemoglobin A1C  Recent Labs  10/19/12 1944  HGBA1C 11.1*   Fasting Lipid Panel No results found for this basename: CHOL, HDL, LDLCALC, TRIG, CHOLHDL, LDLDIRECT,  in the last 72 hours Thyroid Function Tests  Recent Labs  10/19/12  1944  TSH 1.572    TELE  Normal sinus rhythm  ECG    Radiology/Studies  Dg Chest 2 View  10/19/2012   *RADIOLOGY REPORT*  Clinical Data: Abdominal pain, hyperglycemia  CHEST - 2 VIEW  Comparison: 05/28/2010  Findings: Cardiomediastinal silhouette is stable.  Mild thoracic dextroscoliosis.  Degenerative changes thoracic spine. Degenerative changes bilateral AC joints.  There is streaky left basilar atelectasis or infiltrate.  No pulmonary edema.  IMPRESSION: Streaky left basilar atelectasis or infiltrate.  No pulmonary edema.   Original Report Authenticated By: Natasha Mead, M.D.   Ct Angio Chest Pe W/cm &/or Wo Cm  10/19/2012   *RADIOLOGY REPORT*  Clinical Data: Pain, dyspnea, possible pulmonary embolus  CT ANGIOGRAPHY CHEST  Technique:  Multidetector CT imaging of the chest using the standard protocol during bolus administration of intravenous contrast. Multiplanar reconstructed images including MIPs were obtained and reviewed to evaluate the vascular anatomy.  Contrast: OMNIPAQUE IOHEXOL 350 MG/ML SOLN  Comparison: None.  Findings: Central airways are  patent.  Images of the thoracic inlet are unremarkable.  Heart size within normal limits.  No pericardial effusion.  Visualized upper abdomen is unremarkable.  No aortic aneurysm.  Mitral valve calcifications are noted. Atherosclerotic calcifications of the coronary arteries.  No hilar or mediastinal adenopathy.  No pulmonary embolus is identified.  Images of the lung parenchyma shows no acute infiltrate or pulmonary edema.  Sagittal images of the spine shows degenerative changes thoracic spine.  Sagittal view of the sternum is unremarkable.  IMPRESSION:  1.  No pulmonary embolus is noted. 2.  Degenerative changes thoracic spine.  No acute infiltrate or pulmonary edema. 3.  Mitral valve calcifications.  Coronary artery calcifications are noted.   Original Report Authenticated By: Natasha Mead, M.D.    ASSESSMENT AND PLAN  Active Problems:   DIABETES-TYPE 2   HYPERLIPIDEMIA   DEPRESSIVE DISORDER   HYPERTENSION   Chest pain   Hyponatremia    Unstable angina. Catheterization this afternoon. Continue blood sugar control. Headache has been addressed for pain medication. Discussed with Tim her nurse.  Signed, Valera Castle MD

## 2012-10-20 NOTE — Progress Notes (Signed)
  Echocardiogram 2D Echocardiogram has been performed.  Cathie Beams 10/20/2012, 11:31 AM

## 2012-10-21 ENCOUNTER — Encounter (HOSPITAL_COMMUNITY): Payer: Self-pay | Admitting: Nurse Practitioner

## 2012-10-21 ENCOUNTER — Other Ambulatory Visit: Payer: Self-pay | Admitting: Internal Medicine

## 2012-10-21 DIAGNOSIS — I200 Unstable angina: Secondary | ICD-10-CM

## 2012-10-21 DIAGNOSIS — I10 Essential (primary) hypertension: Secondary | ICD-10-CM

## 2012-10-21 DIAGNOSIS — E669 Obesity, unspecified: Secondary | ICD-10-CM | POA: Diagnosis present

## 2012-10-21 DIAGNOSIS — E119 Type 2 diabetes mellitus without complications: Secondary | ICD-10-CM

## 2012-10-21 DIAGNOSIS — I25119 Atherosclerotic heart disease of native coronary artery with unspecified angina pectoris: Secondary | ICD-10-CM

## 2012-10-21 DIAGNOSIS — E785 Hyperlipidemia, unspecified: Secondary | ICD-10-CM

## 2012-10-21 LAB — BASIC METABOLIC PANEL
BUN: 11 mg/dL (ref 6–23)
CO2: 25 mEq/L (ref 19–32)
Calcium: 8.5 mg/dL (ref 8.4–10.5)
Chloride: 102 mEq/L (ref 96–112)
Creatinine, Ser: 0.59 mg/dL (ref 0.50–1.10)
GFR calc Af Amer: >90 mL/min (ref >90)
GFR calc non Af Amer: >90 mL/min (ref >90)
Glucose, Bld: 203 mg/dL — ABNORMAL HIGH (ref 70–99)
Potassium: 3.9 mEq/L (ref 3.5–5.1)
Sodium: 135 mEq/L (ref 135–145)

## 2012-10-21 LAB — CBC
HCT: 36.4 % (ref 36.0–46.0)
Hemoglobin: 12.2 g/dL (ref 12.0–15.0)
MCH: 30 pg (ref 26.0–34.0)
MCHC: 33.5 g/dL (ref 30.0–36.0)
MCV: 89.7 fL (ref 78.0–100.0)
Platelets: 156 10*3/uL (ref 150–400)
RBC: 4.06 MIL/uL (ref 3.87–5.11)
RDW: 12.6 % (ref 11.5–15.5)
WBC: 7.5 10*3/uL (ref 4.0–10.5)

## 2012-10-21 LAB — GLUCOSE, CAPILLARY
Glucose-Capillary: 175 mg/dL — ABNORMAL HIGH (ref 70–99)
Glucose-Capillary: 206 mg/dL — ABNORMAL HIGH (ref 70–99)

## 2012-10-21 MED ORDER — TICAGRELOR 90 MG PO TABS: 90.0000 mg | ORAL_TABLET | Freq: Two times a day (BID) | ORAL | Status: DC

## 2012-10-21 MED ORDER — CARVEDILOL 3.125 MG PO TABS: 3.1250 mg | ORAL_TABLET | Freq: Two times a day (BID) | ORAL | Status: DC

## 2012-10-21 MED ORDER — ASPIRIN 81 MG PO TBEC: 81.0000 mg | DELAYED_RELEASE_TABLET | Freq: Every day | ORAL | Status: DC

## 2012-10-21 MED FILL — Sodium Chloride IV Soln 0.9%: INTRAVENOUS | Qty: 50 | Status: AC

## 2012-10-21 NOTE — Progress Notes (Signed)
Patient Name: Cynthia Singh Date of Encounter: 10/21/2012   Principal Problem:   Unstable angina Active Problems:   DIABETES-TYPE 2   CAD (coronary artery disease)   HYPERLIPIDEMIA   HYPERTENSION   DEPRESSIVE DISORDER   Obesity   Hyponatremia    SUBJECTIVE  No chest pain or sob.  Awaiting d/c.  CURRENT MEDS . amitriptyline  150 mg Oral QHS  . aspirin EC  81 mg Oral Daily  . atorvastatin  40 mg Oral q1800  . carvedilol  3.125 mg Oral BID WC  . cholecalciferol  1,000 Units Oral Daily  . fenofibrate  160 mg Oral Daily  . ferrous gluconate  325 mg Oral Q breakfast  . gabapentin  300 mg Oral QHS  . heparin  5,000 Units Subcutaneous Q8H  . insulin aspart  0-15 Units Subcutaneous TID WC  . insulin glargine  35 Units Subcutaneous QHS  . irbesartan  75 mg Oral Daily  . pantoprazole  40 mg Oral Q1200  . sodium chloride  3 mL Intravenous Q12H  . Ticagrelor  90 mg Oral BID   OBJECTIVE  Filed Vitals:   10/21/12 0753 10/21/12 0923 10/21/12 1125 10/21/12 1147  BP: 129/82  140/58   Pulse: 64 75    Temp:    98.4 F (36.9 C)  TempSrc:    Oral  Resp:  15 14   Height:      Weight:      SpO2:  96%  95%    Intake/Output Summary (Last 24 hours) at 10/21/12 1337 Last data filed at 10/21/12 0924  Gross per 24 hour  Intake 1546.5 ml  Output   4250 ml  Net -2703.5 ml   Filed Weights   10/19/12 0817 10/19/12 1400 10/20/12 1044  Weight: 200 lb (90.719 kg) 219 lb 5.7 oz (99.5 kg) 219 lb 5.7 oz (99.5 kg)   PHYSICAL EXAM  General: Pleasant, NAD. Neuro: Alert and oriented X 3. Moves all extremities spontaneously. Psych: Normal affect. HEENT:  Normal  Neck: Supple without bruits or JVD. Lungs:  Resp regular and unlabored, CTA. Heart: RRR no s3, s4, or murmurs. Abdomen: Soft, non-tender, non-distended, BS + x 4.  Extremities: No clubbing, cyanosis or edema. DP/PT/Radials 2+ and equal bilaterally.  R wrist cath site w/o b/b/h.  Accessory Clinical Findings  CBC  Recent  Labs  10/20/12 0630 10/21/12 0425  WBC 6.4 7.5  HGB 13.6 12.2  HCT 39.9 36.4  MCV 90.7 89.7  PLT 170 156   Basic Metabolic Panel  Recent Labs  10/20/12 0630 10/21/12 0425  NA 135 135  K 4.6 3.9  CL 101 102  CO2 24 25  GLUCOSE 240* 203*  BUN 11 11  CREATININE 0.57 0.59  CALCIUM 8.9 8.5   Liver Function Tests  Recent Labs  10/19/12 0748 10/20/12 0630  AST 21 18  ALT 14 20  ALKPHOS 90 87  BILITOT 0.3 0.4  PROT 6.9 6.8  ALBUMIN 3.7 3.5    Recent Labs  10/19/12 0837  LIPASE 20   Cardiac Enzymes  Recent Labs  10/19/12 1945 10/20/12 0030 10/20/12 0630  TROPONINI <0.30 <0.30 <0.30   Hemoglobin A1C  Recent Labs  10/19/12 1944  HGBA1C 11.1*   Fasting Lipid Panel  Recent Labs  10/20/12 0630  CHOL 203*  HDL 32*  LDLCALC UNABLE TO CALCULATE IF TRIGLYCERIDE OVER 400 mg/dL  TRIG 161*  CHOLHDL 6.3   Thyroid Function Tests  Recent Labs  10/19/12 1944  TSH  1.572    TELE  rsr  ECG  Rsr, 65, poor r prog, inf infarct, no acute st/t changes.  ASSESSMENT AND PLAN  1.  USA/CAD:  S/p PCI/DES to the RCA yesterday.  No chest pain this AM.  Cont asa, brilinta, statin, bb, arb, fibrate.  We will arrange for f/u in our office w/in 2 wks.  2.  Poorly controlled DM:  Per IM.  3.  HTN:  Stable.  4. HL/HTG:  On statin/fibrate.  Signed, Nicolasa Ducking NP

## 2012-10-21 NOTE — Progress Notes (Signed)
CARDIAC REHAB PHASE I   PRE:  Rate/Rhythm: 73 SR    BP: sitting 119/80    SaO2:   MODE:  Ambulation: 390 ft   POST:  Rate/Rhythm: 84 SR    BP: sitting 140/58     SaO2:   Pt c/o HA. Willing to walk. Slow and weak walking but pt sts this better than PTA. Rest x2 due to leg fatigue and lightheadedness. Upon return to room pt looked pale and sts she was lightheaded walking. However BP stable. Pt sts she has been unable to walk long distances for some time due to this lightheadedness. Ed completed and encouraged pt to try her stationary bike to increase ex tol. If she is still symptomatic, she needs to talk with MD. Interested in [facility] and requests her name be sent to [facility]. Pt sts she follows DM diet and takes her meds. Doesn't know why DM and cholesterol has always been out of control.  Encouraged close f/u with PCP/cards. [id number]  Elissa Lovett [place] CES, ACSM 10/21/2012 11:48 AM

## 2012-10-21 NOTE — Progress Notes (Signed)
Subjective: S/p PCI/stenting RCA: 95% - 0% with good flow. 2 D echo w/o wall motion abnormality and normal EF. DM education complete and materials provided.  \ Feeling ok  Objective: Lab:  Recent Labs  10/19/12 1944 10/20/12 0630 10/21/12 0425  WBC 7.8 6.4 7.5  HGB 13.2 13.6 12.2  HCT 38.9 39.9 36.4  MCV 89.2 90.7 89.7  PLT 162 170 156    Recent Labs  10/19/12 0748 10/19/12 0837 10/19/12 1944 10/20/12 0630 10/21/12 0425  NA 130*  --   --  135 135  K 4.3  --   --  4.6 3.9  CL 94*  --   --  101 102  GLUCOSE 519*  --   --  240* 203*  BUN 18 18  --  11 11  CREATININE 0.87  --  0.70 0.57 0.59  CALCIUM 9.0  --   --  8.9 8.5    Imaging:  Scheduled Meds: . amitriptyline  150 mg Oral QHS  . aspirin EC  81 mg Oral Daily  . atorvastatin  40 mg Oral q1800  . carvedilol  3.125 mg Oral BID WC  . cholecalciferol  1,000 Units Oral Daily  . fenofibrate  160 mg Oral Daily  . ferrous gluconate  325 mg Oral Q breakfast  . gabapentin  300 mg Oral QHS  . heparin  5,000 Units Subcutaneous Q8H  . insulin aspart  0-15 Units Subcutaneous TID WC  . insulin glargine  35 Units Subcutaneous QHS  . irbesartan  75 mg Oral Daily  . pantoprazole  40 mg Oral Q1200  . sodium chloride  3 mL Intravenous Q12H  . Ticagrelor  90 mg Oral BID   Continuous Infusions:  PRN Meds:.sodium chloride, acetaminophen, acetaminophen, meclizine, morphine injection, ondansetron, oxyCODONE, oxyCODONE-acetaminophen, sodium chloride   Physical Exam: Filed Vitals:   10/21/12 0753  BP: 129/82  Pulse: 64  Temp:   Resp:         Assessment/Plan: For d/c - dictated #[id number]   Illene Regulus [place] IM (o) [phone]; (c) [phone] Call-grp - [facility] IM  Tele: [phone]  10/21/2012, 8:58 AM

## 2012-10-21 NOTE — Progress Notes (Signed)
DC instructions rev'd with pt.  Discussed medications for home use.  Denies any questions.  Left floor via w/c to  Home with daughter.  F/u appt included with dc instructions.

## 2012-10-22 NOTE — Discharge Summary (Signed)
NAMESHAMAYA, KAUER NO.:  [id number]  MEDICAL RECORD NO.:  [id number]  LOCATION:  2923                         FACILITY:  [facility]  PHYSICIAN:  Rosalyn Gess. Drinda Belgard, MD  DATE OF BIRTH:  July 22, 1940  DATE OF ADMISSION:  10/19/2012 DATE OF DISCHARGE:  10/21/2012                              DISCHARGE SUMMARY   ADMITTED DIAGNOSES: 1. Unstable angina. 2. Diabetes poorly controlled. 3. Hyperlipidemia. 4. Obesity.  DISCHARGE DIAGNOSES: 1. Unstable angina. 2. Diabetes poorly controlled. 3. Hyperlipidemia. 4. Obesity.  CONSULTANTS:  Dr. Antoine Poche for Cardiology.  Dr. Tonny Bollman for Cardiology.  PROCEDURES:  Imaging: 1. Chest x-ray, 2 views, June 4, streaky left basilar atelectasis or     infiltrate.  No pulmonary edema noted. 2. CT angio of the chest, October 19, 2012 read out as no pulmonary     embolus.  Degenerative changes of the thoracic spine noted.  No     acute infiltrate or pulmonary edema.  Mitral valve calcifications     and coronary artery calcifications are noted. 3. Echocardiogram from October 20, 2012.  The patient with normal left     ventricular cavity size.  Wall thickness was increased in the     pattern of mild LVH.  Systolic function was normal with an     estimated ejection fraction of 55% to 60%.  Cardiac catheterization: Performed by Dr. Rollene Rotunda.  Please see full dictated report.  The patient had a distal 95% RCA occlusion.  She had mild nonobstructive coronary disease in other vessels.  Intervention: Cardiac cath with balloon angioplasty and stent placement, performed by Dr. Tonny Bollman on June 5.  Patient underwent angioplasty and stent placement of the distal right coronary artery.  The patient's obstructive 95% went to zero with good flow.  Tolerated the procedure well.  HISTORY OF PRESENT ILLNESS:  Ms. Brecheen is a delightful 72 year old woman with a past medical history of poorly-controlled diabetes,  hypertension, hyperlipidemia.  She came to the emergency department for progressive substernal chest pain with radiation to her left arm associated with dyspnea and nausea.  The patient had been having chest pain for several months that was progressively getting worse and was more commonly associated with exertion.  In the emergency department, her symptoms were resolved with nitroglycerin and morphine.  The patient had no vomiting, fevers, chills, cough, hematochezia, melena, or other complaints.  In the emergency department, she did undergo CT angio to rule out PE which was a negative study.  The patient was subsequently admitted for further evaluation.  Please see the H and P for past medical history, family history, and social history.  HOSPITAL COURSE: 1. Cardiovascular.  The patient was admitted to the coronary care     unit.  Cardiac enzymes were negative x4.  EKG showed no acute     elevations or signs of acute coronary syndrome.  The patient was seen in     consultation by the Cardiology Service: they opined that she was at     very high risk for atherosclerotic vascular disease, and that her     symptoms were typical for progressive unstable angina.  The patient  did come to 2D echo on June 5, and subsequently in the afternoon,     she came to cardiac catheterization revealing significant     obstruction of the RCA followed by PCI with stent placement.  The     patient did well with this procedure.  Her chest pain has been     resolved.  Her telemetry has been stable and she has been     hemodynamically stable.  With the patient's coronary disease,      clearly delineated and intervention performed She is ready for     discharge home for continued medical management. 2. Diabetes.  The patient has had very poorly controlled diabetes     despite efforts using basal insulin therapy as well as metformin as     well as pre meal insulin.  She remains poorly controlled with a      hemoglobin A1c at the time of admission of 11.1%.  The patient was     followed with sliding scale during her hospital stay.  She was seen     by the diabetes education team and provided home educational     materials.  Plan, patient will continue on her present regimen.     She will be referred to Dr. Tera Helper for endocrinology     consult and better management of her diabetes. 3. Hyperlipidemia.  The patient has been on Crestor 10, which she has     tolerated well.  Last lipid panel from August 17, 2012, revealed     total cholesterol 203, triglycerides were 675, HDL was 32.  Direct     measured LDL was 88.7.  Plan, the patient will continue on Crestor since she had a well     controlled LDL.  As an outpatient, we will consider adding Lovaza     or fish oil to bring her HDL up. 4. Hypertension.  Patient's blood pressure has been reasonably well     controlled this hospital stay and as an outpatient.  She will     continue on her present medical regimen.  DISCHARGE EXAMINATION:  VITAL SIGNS:  Temperature was 98.1, blood pressure 129/82, heart rate 64, respirations 24, oxygen saturations 95%. GENERAL APPEARANCE:  This is somewhat haggard appearing woman who is in no acute distress. HEENT:  Conjunctivae and sclerae were clear.  Pupils equal, round, and reactive. NECK:  Supple. PULMONARY:  Patient has normal respirations.  She is moving air well with no rales, wheezes, or rhonchi. CARDIOVASCULAR:  Patient has a pressure bandage in place on the right radial artery, with no swelling or bruising.  She had 2+ radial pulse on the left.  Her precordium was quiet.  She had a regular rate and rhythm. ABDOMEN:  Soft.  She had positive bowel sounds.  No guarding or rebound was appreciated. NEURO:  Patient is awake, alert.  She is oriented to person, place, time, and context.  FINAL LABORATORY:  From the day of discharge, sodium was 135, potassium 3.9, chloride 102, CO2 of 25, BUN 11,  creatinine 0.59, glucose was 203. Liver functions from June 5 were all normal limits.  Troponin I was negative x4.  CBC from the day of discharge with a hemoglobin of 12.2 g, hematocrit 36.4%, white count was 7500, platelet count 156,000.  IMAGING STUDIES:  As above.  DISCHARGE MEDICATIONS: 1. Amitriptyline 150 mg at bedtime. 2. Aspirin 81 mg daily. 3. Carvedilol 3.125 mg b.i.d. 4. Vitamin D3 1000 units daily. 5.  TriCor 145 mg daily. 6. Paragon 325 mg daily. 7. Gabapentin 300 mg at bedtime. 8. Garlic oil 500 mg caps daily. 9. Lantus 50 units subcu at bedtime. 10.Antivert 25 mg t.i.d. p.r.n. 11.Metformin 1000 mg 24 hour tablet 2 times daily with meals. 12.Benicar 40 mg daily. 13.Percocet 10/325 two tablets every 6 hours as needed for pain. 14.Crestor 10 mg daily. 15.Brilinta 90 mg b.i.d.  DISPOSITION:  The patient is discharged to home.  FOLLOWUP:  The patient will be followed up by cardiology as they instruct.  The patient will be referred to Dr. Tera Helper for diabetes management.  The patient will see Dr. Debby Bud in 4 weeks, sooner as needed.     Rosalyn Gess Garin Mata, MD     MEN/MEDQ  D:  10/21/2012  T:  10/21/2012  Job:  [id number]  cc:   Rollene Rotunda, MD, Gwynne Edinger. Excell Seltzer, MD

## 2012-10-24 ENCOUNTER — Other Ambulatory Visit: Payer: Self-pay

## 2012-10-24 MED ORDER — INSULIN GLARGINE 100 UNIT/ML ~~LOC~~ SOLN: 30.0000 [IU] | Freq: Every day | SUBCUTANEOUS | Status: DC

## 2012-11-04 ENCOUNTER — Encounter: Payer: Medicare HMO | Admitting: Physician Assistant

## 2012-11-15 ENCOUNTER — Other Ambulatory Visit: Payer: Self-pay

## 2012-11-15 ENCOUNTER — Ambulatory Visit: Payer: Medicare HMO | Admitting: Internal Medicine

## 2012-11-15 DIAGNOSIS — Z0289 Encounter for other administrative examinations: Secondary | ICD-10-CM

## 2012-11-15 MED ORDER — OXYCODONE-ACETAMINOPHEN 10-325 MG PO TABS: 2.0000 | ORAL_TABLET | Freq: Four times a day (QID) | ORAL | Status: DC | PRN

## 2012-11-15 NOTE — Telephone Encounter (Signed)
Phone call from patient requesting a refill on her pain med. Prepare 3 months? Please advise, thanks.

## 2012-11-15 NOTE — Addendum Note (Signed)
Addended by: Lyanne Co R on: 11/15/2012 02:29 PM   Modules accepted: Orders

## 2012-11-15 NOTE — Telephone Encounter (Signed)
Pt notified via voicemail she can pick up her scripts

## 2012-11-15 NOTE — Telephone Encounter (Signed)
Scripts printed and waiting for Dr Debby Bud signature

## 2012-11-24 ENCOUNTER — Encounter: Payer: Medicare HMO | Admitting: Physician Assistant

## 2012-11-26 ENCOUNTER — Telehealth: Payer: Self-pay | Admitting: Adult Health

## 2012-11-26 ENCOUNTER — Other Ambulatory Visit: Payer: Self-pay | Admitting: Adult Health

## 2012-11-26 MED ORDER — NITROGLYCERIN 0.4 MG SL SUBL: 0.4000 mg | SUBLINGUAL_TABLET | SUBLINGUAL | Status: DC | PRN

## 2012-11-26 NOTE — Telephone Encounter (Signed)
Patient of Dr. Excell Seltzer with known CAD, s/p PCI and stenting of RCA on June 2014.    Having chest pain and has no Rx for NTG. Daughter called requesting it as the patient was having episodes of angina.   I have called in Rx to [facility] in [facility]. [phone]. I have also asked her daughter to check her BG as it has been poorly controlled in the past. If CP continues despite NTG X 3, she is to come to ER. Daughter verbalizes understanding.

## 2012-12-20 ENCOUNTER — Ambulatory Visit: Payer: Medicare HMO | Admitting: Physician Assistant

## 2013-01-02 ENCOUNTER — Telehealth: Payer: Self-pay | Admitting: *Deleted

## 2013-01-02 NOTE — Telephone Encounter (Signed)
Please refer question to cardiology - med for stents.

## 2013-01-02 NOTE — Telephone Encounter (Signed)
Pt called states the Brilinta Rx is now too expensive.  Further states she is completely out of the medication.  Please advise

## 2013-01-02 NOTE — Telephone Encounter (Signed)
Spoke with pt advised of MDs message 

## 2013-01-10 ENCOUNTER — Telehealth (HOSPITAL_COMMUNITY): Payer: Self-pay | Admitting: Cardiac Rehabilitation

## 2013-01-10 NOTE — Telephone Encounter (Signed)
Multiple attempts to contact pt to enroll in cardiac rehab. Left messages and letter mailed with no response.  Dr. Eden Emms made aware

## 2013-02-20 ENCOUNTER — Telehealth: Payer: Self-pay | Admitting: *Deleted

## 2013-02-20 MED ORDER — OXYCODONE-ACETAMINOPHEN 10-325 MG PO TABS: 2.0000 | ORAL_TABLET | Freq: Four times a day (QID) | ORAL | Status: DC | PRN

## 2013-02-20 NOTE — Telephone Encounter (Signed)
Ok for refill: #30 day supply, 3 scripts

## 2013-02-20 NOTE — Telephone Encounter (Signed)
Pt called requesting Percocet 10-325mg  refill.  Please advise

## 2013-02-20 NOTE — Telephone Encounter (Signed)
Spoke with pt advised Rx ready for pick up 

## 2013-04-24 ENCOUNTER — Encounter: Payer: Medicare HMO | Admitting: Physician Assistant

## 2013-04-24 NOTE — Progress Notes (Signed)
This encounter was created in error - please disregard.

## 2013-04-24 NOTE — Progress Notes (Deleted)
[address] [place], [place]  [address] Phone: [phone] Fax:  [phone]  Date:  04/24/2013   ID:  Cynthia Singh, DOB July 13, 1940, MRN [id number]  PCP:  Illene Regulus, MD  Cardiologist:  Dr. Charlton Haws     History of Present Illness: Cynthia Singh is a 72 y.o. female with a hxof T2DM, HTN, HL. She was admitted to hospital in 10/2012 with chest pain consistent with unstable angina. She ruled out for myocardial infarction by enzymes.  Echocardiogram (10/2012): Mild LVH, EF 55-60%. LHC (10/2012): Ostial left main 25, mid LAD 30, ostial D1 60, proximal RCA 50, mid RCA 95, 40, 40 after PDA, EF 65%.  PCI: Promus premier (2.75 x 32 mm) DES to the mid RCA.  Dual antiplatelet therapy recommended x12 months.  ***  Recent Labs: 08/17/2012: Direct LDL 88.7  10/19/2012: TSH 1.572  10/20/2012: ALT 20; HDL 32*; LDL (calc) UNABLE TO CALCULATE IF TRIGLYCERIDE OVER 400 mg/dL  4/0/1027: Creatinine 2.53; Hemoglobin 12.2; Potassium 3.9   Wt Readings from Last 3 Encounters:  10/20/12 219 lb 5.7 oz (99.5 kg)  10/20/12 219 lb 5.7 oz (99.5 kg)  08/17/12 218 lb 12.8 oz (99.247 kg)     Past Medical History  Diagnosis Date  . Osteoarthritis of spine   . Hyperlipemia   . Hypertension   . Diabetes mellitus, type 2   . Depressive disorder   . CAD (coronary artery disease)     a. 9.2001 Cath: LM nl, LAD 30p, LCX nl, OM1 30-40p, RCA dominant diffuse irregs, EF 60-65%;  b. 05/2010 Lexi MV EF 58%, no isch/infarct;  c. 10/2012 Cath/PCI: LM 25 ost, LAD 52m, D1 60ost, LCX min irregs, RCA dominant, 50p, 95/49m (2.75x32 Promus Premier DES), 40d PD/PL's nl, EF 65%.  . Obesity     Current Outpatient Prescriptions  Medication Sig Dispense Refill  . amitriptyline (ELAVIL) 150 MG tablet Take 1 tablet (150 mg total) by mouth at bedtime.  30 tablet  11  . aspirin EC 81 MG EC tablet Take 1 tablet (81 mg total) by mouth daily.      . carvedilol (COREG) 3.125 MG tablet Take 1 tablet (3.125 mg total) by  mouth 2 (two) times daily with a meal.  60 tablet  11  . Cholecalciferol (VITAMIN D3) 1000 UNITS CAPS Take by mouth daily.        . fenofibrate (TRICOR) 145 MG tablet Take 1 tablet (145 mg total) by mouth daily.  30 tablet  11  . ferrous gluconate (FERGON) 325 MG tablet Take 325 mg by mouth daily with breakfast.        . gabapentin (NEURONTIN) 300 MG capsule Take 1 capsule (300 mg total) by mouth at bedtime.  30 capsule  11  . Garlic Oil 500 MG CAPS Take by mouth daily.        . insulin glargine (LANTUS) 100 UNIT/ML injection Inject 0.3 mLs (30 Units total) into the skin at bedtime. titrate up the dose as instructed  5 pen  PRN  . Insulin Pen Needle 31G X 8 MM MISC 1 Device by Does not apply route daily.  30 each  11  . meclizine (ANTIVERT) 25 MG tablet Take 25 mg by mouth 3 (three) times daily as needed.      . metFORMIN (GLUMETZA) 1000 MG (MOD) 24 hr tablet Take 1 tablet (1,000 mg total) by mouth 2 (two) times daily with a meal.  60 tablet  11  .  nitroGLYCERIN (NITROSTAT) 0.4 MG SL tablet Place 1 tablet (0.4 mg total) under the tongue every 5 (five) minutes as needed for chest pain.  30 tablet  12  . olmesartan (BENICAR) 40 MG tablet Take 1 tablet (40 mg total) by mouth daily.  30 tablet  11  . oxyCODONE-acetaminophen (PERCOCET) 10-325 MG per tablet Take 2 tablets by mouth every 6 (six) hours as needed for pain. May fill on or after 04/29/13.  240 tablet  0  . rosuvastatin (CRESTOR) 10 MG tablet Take 1 tablet (10 mg total) by mouth daily.  30 tablet  11  . Ticagrelor (BRILINTA) 90 MG TABS tablet Take 1 tablet (90 mg total) by mouth 2 (two) times daily.  60 tablet  5   No current facility-administered medications for this visit.    Allergies:   Review of patient's allergies indicates no known allergies.   Social History:  The patient  reports that she has never smoked. She has never used smokeless tobacco. She reports that she does not drink alcohol or use illicit drugs.   Family History:   The patient's family history includes Cancer in her brother. There is no history of Coronary artery disease or Heart attack.   ROS:  Please see the history of present illness.   ***   All other systems reviewed and negative.   PHYSICAL EXAM: VS:  There were no vitals taken for this visit. Well nourished, well developed, in no acute distress HEENT: normal Neck: no JVD Cardiac:  normal S1, S2; RRR; no murmur Lungs:  clear to auscultation bilaterally, no wheezing, rhonchi or rales Abd: soft, nontender, no hepatomegaly Ext: no edema Skin: warm and dry Neuro:  CNs 2-12 intact, no focal abnormalities noted  EKG:  ***     ASSESSMENT AND PLAN:  1. CAD:  *** 2. Hypertension:  *** Hyperlipidemia:  She had very high trigs in the hospital in 10/2012.  *** 3. Diabetes Mellitus:  *** 4. Disposition:  ***  Signed, Tereso Newcomer, PA-C  04/24/2013 2:29 PM

## 2013-04-26 ENCOUNTER — Encounter: Payer: Self-pay | Admitting: Physician Assistant

## 2013-05-23 ENCOUNTER — Ambulatory Visit: Payer: Medicare HMO | Admitting: Physician Assistant

## 2013-05-25 ENCOUNTER — Ambulatory Visit: Payer: Medicare HMO | Admitting: Internal Medicine

## 2013-05-30 ENCOUNTER — Other Ambulatory Visit (INDEPENDENT_AMBULATORY_CARE_PROVIDER_SITE_OTHER): Payer: Commercial Managed Care - HMO

## 2013-05-30 ENCOUNTER — Encounter: Payer: Self-pay | Admitting: Internal Medicine

## 2013-05-30 ENCOUNTER — Ambulatory Visit (INDEPENDENT_AMBULATORY_CARE_PROVIDER_SITE_OTHER): Payer: Medicare HMO | Admitting: Internal Medicine

## 2013-05-30 VITALS — BP 176/92 | HR 96 | Temp 96.7°F | Wt 206.4 lb

## 2013-05-30 DIAGNOSIS — I10 Essential (primary) hypertension: Secondary | ICD-10-CM

## 2013-05-30 DIAGNOSIS — E785 Hyperlipidemia, unspecified: Secondary | ICD-10-CM

## 2013-05-30 DIAGNOSIS — E119 Type 2 diabetes mellitus without complications: Secondary | ICD-10-CM

## 2013-05-30 DIAGNOSIS — M25569 Pain in unspecified knee: Secondary | ICD-10-CM

## 2013-05-30 DIAGNOSIS — Z23 Encounter for immunization: Secondary | ICD-10-CM

## 2013-05-30 DIAGNOSIS — I2510 Atherosclerotic heart disease of native coronary artery without angina pectoris: Secondary | ICD-10-CM

## 2013-05-30 LAB — BASIC METABOLIC PANEL
BUN: 12 mg/dL (ref 6–23)
CO2: 26 mEq/L (ref 19–32)
Calcium: 9.3 mg/dL (ref 8.4–10.5)
Chloride: 100 mEq/L (ref 96–112)
Creatinine, Ser: 0.7 mg/dL (ref 0.4–1.2)
GFR: 95.76 mL/min (ref >60.00)
Glucose, Bld: 172 mg/dL — ABNORMAL HIGH (ref 70–99)
Potassium: 3.7 mEq/L (ref 3.5–5.1)
Sodium: 136 mEq/L (ref 135–145)

## 2013-05-30 LAB — HEPATIC FUNCTION PANEL
ALT: 45 U/L — ABNORMAL HIGH (ref 0–35)
AST: 43 U/L — ABNORMAL HIGH (ref 0–37)
Albumin: 4.3 g/dL (ref 3.5–5.2)
Alkaline Phosphatase: 86 U/L (ref 39–117)
Bilirubin, Direct: 0.1 mg/dL (ref 0.0–0.3)
Total Bilirubin: 0.6 mg/dL (ref 0.3–1.2)
Total Protein: 8 g/dL (ref 6.0–8.3)

## 2013-05-30 LAB — HEMOGLOBIN A1C: Hgb A1c MFr Bld: 10.5 % — ABNORMAL HIGH (ref 4.6–6.5)

## 2013-05-30 LAB — LIPID PANEL
Cholesterol: 203 mg/dL — ABNORMAL HIGH (ref 0–200)
HDL: 37.6 mg/dL — ABNORMAL LOW (ref >39.00)
Total CHOL/HDL Ratio: 5
Triglycerides: 467 mg/dL — ABNORMAL HIGH (ref 0.0–149.0)
VLDL: 93.4 mg/dL — ABNORMAL HIGH (ref 0.0–40.0)

## 2013-05-30 LAB — LDL CHOLESTEROL, DIRECT: Direct LDL: 127.1 mg/dL

## 2013-05-30 MED ORDER — OXYCODONE-ACETAMINOPHEN 10-325 MG PO TABS: 2.0000 | ORAL_TABLET | Freq: Four times a day (QID) | ORAL | Status: DC | PRN

## 2013-05-30 NOTE — Progress Notes (Signed)
Subjective:    Patient ID: Cynthia Singh, female    DOB: Apr 19, 1941, 73 y.o.   MRN: [id number]  HPI Cynthia Singh has not been seen for about 9 months. She was hospitalized in June '14 for chest pain - came to cardiac cath - 95% RCA - came to PCI/Stent with normal flow post procedure. During the hospitalization A1C 11.1% . She has not had any office visit/follow up since her hospitalization.  She reports she has a rough time of it: she is stressed with child care; she has developed pain all over. She also reports that she has not been able to afford all her medications and has been skimping on doses and missing several medications.   She denies any chest pain, breathing trouble, any hypoglycemic episodes.  Past Medical History  Diagnosis Date  . Osteoarthritis of spine   . Hyperlipemia   . Hypertension   . Diabetes mellitus, type 2   . Depressive disorder   . CAD (coronary artery disease)     a. 9.2001 Cath: LM nl, LAD 30p, LCX nl, OM1 30-40p, RCA dominant diffuse irregs, EF 60-65%;  b. 05/2010 Lexi MV EF 58%, no isch/infarct;  c. 10/2012 Cath/PCI: LM 25 ost, LAD 42m, D1 60ost, LCX min irregs, RCA dominant, 50p, 95/14m (2.75x32 Promus Premier DES), 40d PD/PL's nl, EF 65%.  . Obesity    Past Surgical History  Procedure Laterality Date  . Gallbladder surgery  1968  . Appendectomy  1959  . Combined hysterectomy abdominal w/ a&p repair / oophorectomy  1968  . Multiple d&c    . Tonsillectomy  age 49   Family History  Problem Relation Age of Onset  . Cancer Brother     Colon  . Coronary artery disease Neg Hx   . Heart attack Neg Hx    History   Social History  . Marital Status: Widowed    Spouse Name: james    Number of Children: 3  . Years of Education: 12   Occupational History  . CASHIER    Social History Main Topics  . Smoking status: Never Smoker   . Smokeless tobacco: Never Used  . Alcohol Use: No  . Drug Use: No  . Sexual Activity: No   Other Topics Concern  .  Not on file   Social History Narrative   HSG Married- '62 widowed '13. 2 sons- '63, '66; one daughter-'69; 11 grandchildren, 4 great grandchildren. Work- Medical sales representative Work, Scientist, water quality. Consulting civil engineer.  Currently lives with 1 dtr, grand-daughter, and 44 yr old adopted son.              Current Outpatient Prescriptions on File Prior to Visit  Medication Sig Dispense Refill  . amitriptyline (ELAVIL) 150 MG tablet Take 1 tablet (150 mg total) by mouth at bedtime.  30 tablet  11  . aspirin EC 81 MG EC tablet Take 1 tablet (81 mg total) by mouth daily.      . carvedilol (COREG) 3.125 MG tablet Take 1 tablet (3.125 mg total) by mouth 2 (two) times daily with a meal.  60 tablet  11  . Cholecalciferol (VITAMIN D3) 1000 UNITS CAPS Take by mouth daily.        . fenofibrate (TRICOR) 145 MG tablet Take 1 tablet (145 mg total) by mouth daily.  30 tablet  11  . ferrous gluconate (FERGON) 325 MG tablet Take 325 mg by mouth daily with breakfast.        . gabapentin (NEURONTIN)  300 MG capsule Take 1 capsule (300 mg total) by mouth at bedtime.  30 capsule  11  . Garlic Oil 696 MG CAPS Take by mouth daily.        . insulin glargine (LANTUS) 100 UNIT/ML injection Inject 0.3 mLs (30 Units total) into the skin at bedtime. titrate up the dose as instructed  5 pen  PRN  . Insulin Pen Needle 31G X 8 MM MISC 1 Device by Does not apply route daily.  30 each  11  . meclizine (ANTIVERT) 25 MG tablet Take 25 mg by mouth 3 (three) times daily as needed.      . metFORMIN (GLUMETZA) 1000 MG (MOD) 24 hr tablet Take 1 tablet (1,000 mg total) by mouth 2 (two) times daily with a meal.  60 tablet  11  . nitroGLYCERIN (NITROSTAT) 0.4 MG SL tablet Place 1 tablet (0.4 mg total) under the tongue every 5 (five) minutes as needed for chest pain.  30 tablet  12  . olmesartan (BENICAR) 40 MG tablet Take 1 tablet (40 mg total) by mouth daily.  30 tablet  11  . rosuvastatin (CRESTOR) 10 MG tablet Take 1 tablet (10 mg total) by mouth daily.  30  tablet  11  . Ticagrelor (BRILINTA) 90 MG TABS tablet Take 1 tablet (90 mg total) by mouth 2 (two) times daily.  60 tablet  5   No current facility-administered medications on file prior to visit.      Review of Systems System review is negative for any constitutional, cardiac, pulmonary, GI or neuro symptoms or complaints other than as described in the HPI.     Objective:   Physical Exam Filed Vitals:   05/30/13 1428  BP: 176/92  Pulse: 96  Temp: 96.7 F (35.9 C)   Wt Readings from Last 3 Encounters:  05/30/13 206 lb 6.4 oz (93.622 kg)  10/20/12 219 lb 5.7 oz (99.5 kg)  10/20/12 219 lb 5.7 oz (99.5 kg)   BP Readings from Last 3 Encounters:  05/30/13 176/92  10/21/12 140/58  10/21/12 140/58   Gen'l - overweight woman in no distress HEENT- C&S clear Neck - supple Cor - 2+ radial, RRR Pulm - normal respirations, CTAP Neuro - A&O x 3, see DM foot       Assessment & Plan:

## 2013-05-30 NOTE — Progress Notes (Signed)
Pre visit review using our clinic review tool, if applicable. No additional management support is needed unless otherwise documented below in the visit note. 

## 2013-05-30 NOTE — Patient Instructions (Signed)
Thanks for coming in to see me. I miss that rascal too, no one to really harass me.  If you run out of medicine give [place] a chance to help you. It is important that you have your medications  Lab today to check cholesterol and blood sugar. Results will be mailed to you.   Be sure you check your feet every day.  Refills today on the pain medication.

## 2013-05-31 NOTE — Assessment & Plan Note (Signed)
BP Readings from Last 3 Encounters:  05/30/13 176/92  10/21/12 140/58  10/21/12 140/58   Poor control - she has been out of medication due to $$$  Plan Provided samples of Benicar 40

## 2013-05-31 NOTE — Assessment & Plan Note (Signed)
Has not been able to afford insulin so she has been "stretching" out dosing! Lab Results  Component Value Date   HGBA1C 10.5* 05/30/2013   Plan She is adamantly adivsed to take insulin as directed.  She is provided 2 lantus solostar pens

## 2013-05-31 NOTE — Assessment & Plan Note (Signed)
[name] has not had any cardiology f/u since PCI June '14. She has not complaints. She reports she has continued Brilinta and coreg, intermittently takes "statin."  Plan - keep schedule cardiology f/u visit

## 2013-05-31 NOTE — Assessment & Plan Note (Signed)
LDL 9 months ago = 88.7 and today 127.1. She has not been taking medication as ordered due to $$$  Plan Provided samples of crestor  She is instructed to take meds a directed - stressed the importance of risk reduction

## 2013-05-31 NOTE — Assessment & Plan Note (Signed)
No change in condition.  Plan - controlled substance contract  Refilled pain meds.

## 2013-06-02 ENCOUNTER — Ambulatory Visit: Payer: Medicare HMO | Admitting: Physician Assistant

## 2013-06-04 ENCOUNTER — Encounter: Payer: Self-pay | Admitting: Internal Medicine

## 2013-06-10 ENCOUNTER — Other Ambulatory Visit: Payer: Self-pay | Admitting: Internal Medicine

## 2013-06-21 ENCOUNTER — Ambulatory Visit: Payer: Medicare HMO | Admitting: Physician Assistant

## 2013-06-30 ENCOUNTER — Encounter: Payer: Self-pay | Admitting: Physician Assistant

## 2013-07-01 ENCOUNTER — Other Ambulatory Visit: Payer: Self-pay | Admitting: Internal Medicine

## 2013-07-14 ENCOUNTER — Other Ambulatory Visit: Payer: Self-pay | Admitting: Internal Medicine

## 2013-08-02 ENCOUNTER — Encounter: Payer: Self-pay | Admitting: Internal Medicine

## 2013-08-02 ENCOUNTER — Ambulatory Visit (INDEPENDENT_AMBULATORY_CARE_PROVIDER_SITE_OTHER): Payer: Medicare HMO | Admitting: Internal Medicine

## 2013-08-02 VITALS — BP 178/90 | HR 91 | Temp 97.4°F | Wt 213.6 lb

## 2013-08-02 DIAGNOSIS — F3289 Other specified depressive episodes: Secondary | ICD-10-CM

## 2013-08-02 DIAGNOSIS — I10 Essential (primary) hypertension: Secondary | ICD-10-CM

## 2013-08-02 DIAGNOSIS — F329 Major depressive disorder, single episode, unspecified: Secondary | ICD-10-CM

## 2013-08-02 DIAGNOSIS — M25569 Pain in unspecified knee: Secondary | ICD-10-CM

## 2013-08-02 DIAGNOSIS — E119 Type 2 diabetes mellitus without complications: Secondary | ICD-10-CM

## 2013-08-02 MED ORDER — FUROSEMIDE 40 MG PO TABS: 40.0000 mg | ORAL_TABLET | Freq: Every day | ORAL | Status: DC

## 2013-08-02 MED ORDER — CARVEDILOL 6.25 MG PO TABS: 6.2500 mg | ORAL_TABLET | Freq: Two times a day (BID) | ORAL | Status: DC

## 2013-08-02 MED ORDER — OXYCODONE-ACETAMINOPHEN 10-325 MG PO TABS: 2.0000 | ORAL_TABLET | Freq: Four times a day (QID) | ORAL | Status: DC | PRN

## 2013-08-02 NOTE — Progress Notes (Signed)
Pre visit review using our clinic review tool, if applicable. No additional management support is needed unless otherwise documented below in the visit note. 

## 2013-08-05 NOTE — Assessment & Plan Note (Signed)
Remains a poor candidate for TKR due to poorly controlled diabetes. She continues with pain medication which she tolerates w/o GI complications or mental status changes.  Plan Continue efforts to get her to the point where she can safely have TKR  Renewed pain medications.

## 2013-08-05 NOTE — Assessment & Plan Note (Signed)
BP Readings from Last 3 Encounters:  08/02/13 178/90  05/30/13 176/92  10/21/12 140/58   Poor control. She claims she is adherent to medical regimen but medications are hard for her to afford. In a supervised setting, e.g. Hospital, BP is well controlled.  Plan Advised to take medications as directed.   Add diuretic - furosemide  F/u BP readings

## 2013-08-05 NOTE — Assessment & Plan Note (Signed)
Lab Results  Component Value Date   HGBA1C 10.5* 05/30/2013   Long history of poor control. It is a combination of adherence to diet/exercise and $$ issues with medication.  Plan No change in medication  Refer to Endocrinology for assistance with management in a high risk patient.

## 2013-08-05 NOTE — Assessment & Plan Note (Signed)
No change in condition - no SI.  Plan Continue present medication - also serves to help with neuropathy.

## 2013-08-05 NOTE — Progress Notes (Signed)
Subjective:    Patient ID: Cynthia Singh, female    DOB: 07-08-1940, 73 y.o.   MRN: [id number]  HPI Mrs. Coale presents for follow up of hypertension, DM and chronic knee pain. She reports no real change in her condition: continues to have limiting pain due to DJD end-stage knee. She reports adherence to her medical regimen but does have difficulty paying for her medications!! Her social situation is stable: widowed but with a supportive family.  Past Medical History  Diagnosis Date  . Osteoarthritis of spine   . Hyperlipemia   . Hypertension   . Diabetes mellitus, type 2   . Depressive disorder   . CAD (coronary artery disease)     a. 9.2001 Cath: LM nl, LAD 30p, LCX nl, OM1 30-40p, RCA dominant diffuse irregs, EF 60-65%;  b. 05/2010 Lexi MV EF 58%, no isch/infarct;  c. 10/2012 Cath/PCI: LM 25 ost, LAD 77m, D1 60ost, LCX min irregs, RCA dominant, 50p, 95/15m (2.75x32 Promus Premier DES), 40d PD/PL's nl, EF 65%.  . Obesity    Past Surgical History  Procedure Laterality Date  . Gallbladder surgery  1968  . Appendectomy  1959  . Combined hysterectomy abdominal w/ a&p repair / oophorectomy  1968  . Multiple d&c    . Tonsillectomy  age 23   Family History  Problem Relation Age of Onset  . Cancer Brother     Colon  . Coronary artery disease Neg Hx   . Heart attack Neg Hx    History   Social History  . Marital Status: Widowed    Spouse Name: james    Number of Children: 3  . Years of Education: 12   Occupational History  . CASHIER    Social History Main Topics  . Smoking status: Never Smoker   . Smokeless tobacco: Never Used  . Alcohol Use: No  . Drug Use: No  . Sexual Activity: No   Other Topics Concern  . Not on file   Social History Narrative   HSG Married- '62 widowed '13. 2 sons- '63, '66; one daughter-'69; 11 grandchildren, 4 great grandchildren. Work- Medical sales representative Work, Scientist, water quality. Consulting civil engineer.  Currently lives with 1 dtr, grand-daughter, and 52 yr old adopted son.               Current Outpatient Prescriptions on File Prior to Visit  Medication Sig Dispense Refill  . amitriptyline (ELAVIL) 150 MG tablet TAKE ONE TABLET BY MOUTH EVERY NIGHT AT BEDTIME  30 tablet  11  . aspirin EC 81 MG EC tablet Take 1 tablet (81 mg total) by mouth daily.      . B-D UF III MINI PEN NEEDLES 31G X 5 MM MISC USE AS DIRECTED WITH LANTUS SOLOSTAR  100 each  11  . BENICAR 40 MG tablet TAKE ONE (1) TABLET BY MOUTH EVERY DAY  30 tablet  5  . Cholecalciferol (VITAMIN D3) 1000 UNITS CAPS Take by mouth daily.        . fenofibrate (TRICOR) 145 MG tablet Take 1 tablet (145 mg total) by mouth daily.  30 tablet  11  . ferrous gluconate (FERGON) 325 MG tablet Take 325 mg by mouth daily with breakfast.        . gabapentin (NEURONTIN) 300 MG capsule Take 1 capsule (300 mg total) by mouth at bedtime.  30 capsule  11  . Garlic Oil 315 MG CAPS Take by mouth daily.        . insulin glargine (  LANTUS) 100 UNIT/ML injection Inject 0.3 mLs (30 Units total) into the skin at bedtime. titrate up the dose as instructed  5 pen  PRN  . meclizine (ANTIVERT) 25 MG tablet Take 25 mg by mouth 3 (three) times daily as needed.      . metFORMIN (GLUCOPHAGE) 1000 MG tablet TAKE ONE TABLET BY MOUTH TWICE DAILY WITH A MEAL  60 tablet  5  . metFORMIN (GLUMETZA) 1000 MG (MOD) 24 hr tablet Take 1 tablet (1,000 mg total) by mouth 2 (two) times daily with a meal.  60 tablet  11  . nitroGLYCERIN (NITROSTAT) 0.4 MG SL tablet Place 1 tablet (0.4 mg total) under the tongue every 5 (five) minutes as needed for chest pain.  30 tablet  12  . rosuvastatin (CRESTOR) 10 MG tablet Take 1 tablet (10 mg total) by mouth daily.  30 tablet  11  . Ticagrelor (BRILINTA) 90 MG TABS tablet Take 1 tablet (90 mg total) by mouth 2 (two) times daily.  60 tablet  5   No current facility-administered medications on file prior to visit.      Review of Systems System review is negative for any constitutional, cardiac, pulmonary, GI or  neuro symptoms or complaints other than as described in the HPI.     Objective:   Physical Exam Filed Vitals:   08/02/13 1406  BP: 178/90  Pulse: 91  Temp: 97.4 F (36.3 C)   Wt Readings from Last 3 Encounters:  08/02/13 213 lb 9.6 oz (96.888 kg)  05/30/13 206 lb 6.4 oz (93.622 kg)  10/20/12 219 lb 5.7 oz (99.5 kg)   BP Readings from Last 3 Encounters:  08/02/13 178/90  05/30/13 176/92  10/21/12 140/58   Gen'l - overweight, haggard appearing woman in no acute distress HEENT - C&S clear, PERRLA Neck - supple Cor 2+ radial pulse, RRR Pul Normal respirations, no wheezes or rales. Neuro - Awake and oriented x 3.       Assessment & Plan:

## 2013-08-11 ENCOUNTER — Telehealth: Payer: Self-pay

## 2013-08-11 NOTE — Telephone Encounter (Signed)
Relevant patient education mailed to patient.  

## 2013-10-10 ENCOUNTER — Ambulatory Visit: Payer: Medicare HMO | Admitting: Internal Medicine

## 2013-10-16 ENCOUNTER — Encounter (INDEPENDENT_AMBULATORY_CARE_PROVIDER_SITE_OTHER): Payer: Self-pay

## 2013-10-16 ENCOUNTER — Encounter: Payer: Self-pay | Admitting: Internal Medicine

## 2013-10-16 ENCOUNTER — Ambulatory Visit (INDEPENDENT_AMBULATORY_CARE_PROVIDER_SITE_OTHER): Payer: Commercial Managed Care - HMO | Admitting: Internal Medicine

## 2013-10-16 VITALS — BP 130/70 | HR 79 | Temp 98.4°F | Ht 68.0 in | Wt 215.0 lb

## 2013-10-16 DIAGNOSIS — M1990 Unspecified osteoarthritis, unspecified site: Secondary | ICD-10-CM

## 2013-10-16 DIAGNOSIS — F39 Unspecified mood [affective] disorder: Secondary | ICD-10-CM

## 2013-10-16 DIAGNOSIS — I10 Essential (primary) hypertension: Secondary | ICD-10-CM

## 2013-10-16 DIAGNOSIS — E785 Hyperlipidemia, unspecified: Secondary | ICD-10-CM

## 2013-10-16 DIAGNOSIS — E1149 Type 2 diabetes mellitus with other diabetic neurological complication: Secondary | ICD-10-CM

## 2013-10-16 DIAGNOSIS — E1142 Type 2 diabetes mellitus with diabetic polyneuropathy: Secondary | ICD-10-CM

## 2013-10-16 LAB — HM DIABETES FOOT EXAM

## 2013-10-16 MED ORDER — LOSARTAN POTASSIUM 100 MG PO TABS: 100.0000 mg | ORAL_TABLET | Freq: Every day | ORAL | Status: DC

## 2013-10-16 MED ORDER — ATORVASTATIN CALCIUM 40 MG PO TABS: 40.0000 mg | ORAL_TABLET | Freq: Every day | ORAL | Status: DC

## 2013-10-16 NOTE — Assessment & Plan Note (Signed)
Seems like mostly numbness On amitriptylline No other meds for now (main pain seems arthritic)

## 2013-10-16 NOTE — Progress Notes (Signed)
Subjective:    Patient ID: Cynthia Singh, female    DOB: Aug 02, 1940, 73 y.o.   MRN: [id number]  HPI Transferring care from Dr Linda Hedges Reviewed her history and advanced directives  Checks sugar twice a day---no adjustment in meds Usually in 200's--even fasting No hypoglycemic spells Pain in feet and numbness--no ulcers  Has been taking BP meds No major headaches No recent chest pain--- points to epigastrium No SOB Uses zantac OTC--this helps (prn)  Chronic depression and some anxiety Sleep problems Has gotten good results with amitriptylline  Knee and back pain Neuropathy in feet also Takes high dose of oxycodone---  8 per day!! Stiffness and pain in hands in AM also  Current Outpatient Prescriptions on File Prior to Visit  Medication Sig Dispense Refill  . amitriptyline (ELAVIL) 150 MG tablet TAKE ONE TABLET BY MOUTH EVERY NIGHT AT BEDTIME  30 tablet  11  . aspirin EC 81 MG EC tablet Take 1 tablet (81 mg total) by mouth daily.      . B-D UF III MINI PEN NEEDLES 31G X 5 MM MISC USE AS DIRECTED WITH LANTUS SOLOSTAR  100 each  11  . BENICAR 40 MG tablet TAKE ONE (1) TABLET BY MOUTH EVERY DAY  30 tablet  5  . carvedilol (COREG) 6.25 MG tablet Take 1 tablet (6.25 mg total) by mouth 2 (two) times daily with a meal.  60 tablet  11  . Cholecalciferol (VITAMIN D3) 1000 UNITS CAPS Take by mouth daily.        . furosemide (LASIX) 40 MG tablet Take 1 tablet (40 mg total) by mouth daily.  30 tablet  3  . Garlic Oil 732 MG CAPS Take by mouth daily.        . metFORMIN (GLUCOPHAGE) 1000 MG tablet TAKE ONE TABLET BY MOUTH TWICE DAILY WITH A MEAL  60 tablet  5  . nitroGLYCERIN (NITROSTAT) 0.4 MG SL tablet Place 1 tablet (0.4 mg total) under the tongue every 5 (five) minutes as needed for chest pain.  30 tablet  12  . oxyCODONE-acetaminophen (PERCOCET) 10-325 MG per tablet Take 2 tablets by mouth every 6 (six) hours as needed for pain. May fill on or after 10/28/13.  240 tablet  0  .  rosuvastatin (CRESTOR) 10 MG tablet Take 1 tablet (10 mg total) by mouth daily.  30 tablet  11   No current facility-administered medications on file prior to visit.    No Known Allergies  Past Medical History  Diagnosis Date  . Osteoarthritis of spine     knees also  . Hyperlipemia   . Hypertension   . Diabetes mellitus, type 2   . Episodic mood disorder   . CAD (coronary artery disease)     a. 9.2001 Cath: LM nl, LAD 30p, LCX nl, OM1 30-40p, RCA dominant diffuse irregs, EF 60-65%;  b. 05/2010 Lexi MV EF 58%, no isch/infarct;  c. 10/2012 Cath/PCI: LM 25 ost, LAD 50m, D1 60ost, LCX min irregs, RCA dominant, 50p, 95/51m (2.75x32 Promus Premier DES), 40d PD/PL's nl, EF 65%.  . Obesity     Past Surgical History  Procedure Laterality Date  . Gallbladder surgery  1968  . Appendectomy  1959  . Combined hysterectomy abdominal w/ a&p repair / oophorectomy  1968  . Multiple d&c    . Tonsillectomy  age 62  . Coronary stent placement  2014    Family History  Problem Relation Age of Onset  . Cancer Brother  Colon  . Coronary artery disease Neg Hx   . Heart attack Neg Hx     History   Social History  . Marital Status: Widowed    Spouse Name: N/A    Number of Children: 3  . Years of Education: 12   Occupational History  . CASHIER    Social History Main Topics  . Smoking status: Never Smoker   . Smokeless tobacco: Never Used  . Alcohol Use: No  . Drug Use: No  . Sexual Activity: No   Other Topics Concern  . Not on file   Social History Narrative   Widowed '13. 2 sons- '63, '66; one daughter-'69;    20 grandchildren--has raised 9 of her grandchildren 4 great grandchildren.   Lives with 2 granddaughters and adoptive son      Has living will   Probably would have son Vella Colquitt as health care POA   Would accept resuscitation but no prolonged ventilation   No sure about tube feeds   Review of Systems Chronic constipation-- uses ex-lax at night before she has a day  off. This will work. No voiding problems     Objective:   Physical Exam  Constitutional: She appears well-developed and well-nourished. No distress.  Neck: Normal range of motion. Neck supple. No thyromegaly present.  Cardiovascular: Normal rate, regular rhythm, normal heart sounds and intact distal pulses.  Exam reveals no gallop.   No murmur heard. Pulmonary/Chest: Effort normal and breath sounds normal. No respiratory distress. She has no wheezes. She has no rales.  Abdominal: Soft.  Slight generalized tenderness  Lymphadenopathy:    She has no cervical adenopathy.  Neurological:  Decreased sensation in feet  Skin: No rash noted. No erythema.  No foot lesions  Psychiatric: She has a normal mood and affect. Her behavior is normal.          Assessment & Plan:

## 2013-10-16 NOTE — Progress Notes (Signed)
Pre visit review using our clinic review tool, if applicable. No additional management support is needed unless otherwise documented below in the visit note. 

## 2013-10-16 NOTE — Assessment & Plan Note (Signed)
Chronic depression and anxiety Does okay on the amitriptyline

## 2013-10-16 NOTE — Patient Instructions (Addendum)
Please try to cut down on the percocet to no more than 6-7 per day. Stop benicar and crestor----replace them with generics losartan and atorvastatin.

## 2013-10-16 NOTE — Assessment & Plan Note (Signed)
Very high dose Asked her to try to cut down to 6-7

## 2013-10-16 NOTE — Assessment & Plan Note (Signed)
Better now Taking meds regularly now Will change benicar to losartan for generic

## 2013-10-16 NOTE — Assessment & Plan Note (Signed)
If still high, will increase lantus---since fastings are still high

## 2013-10-16 NOTE — Assessment & Plan Note (Signed)
Will change to generic atorvastatin No samples here

## 2013-10-17 LAB — HEMOGLOBIN A1C: Hgb A1c MFr Bld: 10.2 % — ABNORMAL HIGH (ref 4.6–6.5)

## 2013-11-02 ENCOUNTER — Other Ambulatory Visit: Payer: Self-pay | Admitting: Internal Medicine

## 2013-11-02 DIAGNOSIS — E119 Type 2 diabetes mellitus without complications: Secondary | ICD-10-CM

## 2013-11-21 ENCOUNTER — Other Ambulatory Visit: Payer: Self-pay

## 2013-11-21 DIAGNOSIS — E119 Type 2 diabetes mellitus without complications: Secondary | ICD-10-CM

## 2013-11-21 NOTE — Telephone Encounter (Signed)
Pt left v/m requesting rx oxycodone apap. Call when ready for pick up.

## 2013-11-22 MED ORDER — OXYCODONE-ACETAMINOPHEN 10-325 MG PO TABS: 1.0000 | ORAL_TABLET | Freq: Four times a day (QID) | ORAL | Status: DC | PRN

## 2013-11-22 NOTE — Telephone Encounter (Signed)
Spoke with patient and advised rx ready for pick-up and it will be at the front desk.  

## 2013-11-27 ENCOUNTER — Ambulatory Visit (INDEPENDENT_AMBULATORY_CARE_PROVIDER_SITE_OTHER): Payer: Commercial Managed Care - HMO | Admitting: Internal Medicine

## 2013-11-27 ENCOUNTER — Encounter: Payer: Self-pay | Admitting: Internal Medicine

## 2013-11-27 VITALS — BP 168/66 | HR 64 | Temp 97.6°F | Resp 12 | Ht 68.0 in | Wt 214.0 lb

## 2013-11-27 DIAGNOSIS — E1149 Type 2 diabetes mellitus with other diabetic neurological complication: Secondary | ICD-10-CM

## 2013-11-27 MED ORDER — GLIPIZIDE ER 5 MG PO TB24: 10.0000 mg | ORAL_TABLET | Freq: Every day | ORAL | Status: DC

## 2013-11-27 NOTE — Progress Notes (Signed)
Patient ID: Cynthia Singh, female   DOB: [birth year], 73 y.o.   MRN: [id number]  HPI: Cynthia Singh is a 73 y.o.-year-old female, referred by her PCP, Dr. Silvio Pate, for management of DM2, insulin-dependent, uncontrolled, with complications (CAD, PN).  Patient has been diagnosed with diabetes in 1999; she started insulin in 2010.  Last hemoglobin A1c was: Lab Results  Component Value Date   HGBA1C 10.2* 10/16/2013   HGBA1C 10.5* 05/30/2013   HGBA1C 11.1* 10/19/2012   Pt is on a regimen of: - Metformin 1000 mg po bid - Lantus 50 units qhs  Pt checks her sugars 2-3x a day and they are: - am: 160-170 - 2h after b'fast: 250s - before lunch: 200s - 2h after lunch: 200s - before dinner: 160-170 - 2h after dinner: 200s No lows. Lowest sugar was 160;? if she has hypoglycemia awareness. Highest sugar was 200s.   Pt's meals are: - Breakfast: cheerios, banana, milk  - Lunch: sandwich  - Dinner: pot pie; salmon patties + veggies (cabbage, lima beans) - Snacks: 2: orange, sweets She is walking for exercise  - no CKD, last BUN/creatinine:  Lab Results  Component Value Date   BUN 12 05/30/2013   CREATININE 0.7 05/30/2013  On Losartan. - last set of lipids: Lab Results  Component Value Date   CHOL 203* 05/30/2013   HDL 37.60* 05/30/2013   LDLCALC UNABLE TO CALCULATE IF TRIGLYCERIDE OVER 400 mg/dL 10/20/2012   LDLDIRECT 127.1 05/30/2013   TRIG 467.0* 05/30/2013   CHOLHDL 5 05/30/2013  On Atorvastatin, fish oil.  - last eye exam was 2-3 years ago. No DR. Has an appt 12/13/2013.  - + numbness and tingling in her feet. On Elavil >> helps.  Pt has FH of DM in daughter (DM1). Pt is adopted.   ROS: Constitutional: no weight gain/loss, no fatigue, + subjective hyperthermia Eyes: no blurry vision, no xerophthalmia ENT: no sore throat, no nodules palpated in throat, no dysphagia/odynophagia, no hoarseness Cardiovascular: no CP/SOB/palpitations/leg swelling Respiratory: no  cough/SOB Gastrointestinal: no N/V/D/+ C/+ heartburn Musculoskeletal: no muscle/+ joint aches Skin: no rashes, + hair loss Neurological: no tremors/numbness/tingling/dizziness Psychiatric: no depression/anxiety  Past Medical History  Diagnosis Date  . Osteoarthritis of spine     knees also  . Hyperlipemia   . Hypertension   . Diabetes mellitus, type 2   . Episodic mood disorder   . CAD (coronary artery disease)     a. 9.2001 Cath: LM nl, LAD 30p, LCX nl, OM1 30-40p, RCA dominant diffuse irregs, EF 60-65%;  b. 05/2010 Lexi MV EF 58%, no isch/infarct;  c. 10/2012 Cath/PCI: LM 25 ost, LAD 91m, D1 60ost, LCX min irregs, RCA dominant, 50p, 95/82m (2.75x32 Promus Premier DES), 40d PD/PL's nl, EF 65%.  . Obesity    Past Surgical History  Procedure Laterality Date  . Gallbladder surgery  1968  . Appendectomy  1959  . Combined hysterectomy abdominal w/ a&p repair / oophorectomy  1968  . Multiple d&c    . Tonsillectomy  age 50  . Coronary stent placement  2014   History   Social History  . Marital Status: Widowed    Spouse Name: N/A    Number of Children: 3  . Years of Education: 12   Occupational History  . CASHIER    Social History Main Topics  . Smoking status: Never Smoker   . Smokeless tobacco: Never Used  . Alcohol Use: No  . Drug Use: No  . Sexual Activity: No  Social History Narrative   Widowed '13. 2 sons- '63, '66; one daughter-'69;    80 grandchildren--has raised 9 of her grandchildren 4 great grandchildren.   Lives with 2 granddaughters and adoptive son      Has living will   Probably would have son Equilla Que as health care POA   Would accept resuscitation but no prolonged ventilation   No sure about tube feeds   Current Outpatient Prescriptions on File Prior to Visit  Medication Sig Dispense Refill  . amitriptyline (ELAVIL) 150 MG tablet TAKE ONE TABLET BY MOUTH EVERY NIGHT AT BEDTIME  30 tablet  11  . aspirin EC 81 MG EC tablet Take 1 tablet (81 mg  total) by mouth daily.      Marland Kitchen atorvastatin (LIPITOR) 40 MG tablet Take 1 tablet (40 mg total) by mouth daily.  30 tablet  11  . B-D UF III MINI PEN NEEDLES 31G X 5 MM MISC USE AS DIRECTED WITH LANTUS SOLOSTAR  100 each  11  . carvedilol (COREG) 6.25 MG tablet Take 1 tablet (6.25 mg total) by mouth 2 (two) times daily with a meal.  60 tablet  11  . Cholecalciferol (VITAMIN D3) 1000 UNITS CAPS Take by mouth daily.        . furosemide (LASIX) 40 MG tablet Take 1 tablet (40 mg total) by mouth daily.  30 tablet  3  . Garlic Oil 638 MG CAPS Take by mouth daily.        . insulin glargine (LANTUS) 100 UNIT/ML injection Inject 50 Units into the skin at bedtime. titrate up the dose as instructed      . Iron-Vit C-Vit B12-Folic Acid (IRON 466 PLUS PO) Take by mouth.      . losartan (COZAAR) 100 MG tablet Take 1 tablet (100 mg total) by mouth daily.  30 tablet  11  . metFORMIN (GLUCOPHAGE) 1000 MG tablet TAKE ONE TABLET BY MOUTH TWICE DAILY WITH A MEAL  60 tablet  5  . nitroGLYCERIN (NITROSTAT) 0.4 MG SL tablet Place 1 tablet (0.4 mg total) under the tongue every 5 (five) minutes as needed for chest pain.  30 tablet  12  . oxyCODONE-acetaminophen (PERCOCET) 10-325 MG per tablet Take 1-2 tablets by mouth every 6 (six) hours as needed for pain.  240 tablet  0   No current facility-administered medications on file prior to visit.   No Known Allergies Family History  Problem Relation Age of Onset  . Cancer Brother     Colon  . Coronary artery disease Neg Hx   . Heart attack Neg Hx    PE: BP 168/66  Pulse 64  Temp(Src) 97.6 F (36.4 C) (Oral)  Resp 12  Ht 5\' 8"  (1.727 m)  Wt 214 lb (97.07 kg)  BMI 32.55 kg/m2  SpO2 95% Wt Readings from Last 3 Encounters:  11/27/13 214 lb (97.07 kg)  10/16/13 215 lb (97.523 kg)  08/02/13 213 lb 9.6 oz (96.888 kg)   Constitutional: overweight, in NAD Eyes: PERRLA, EOMI, no exophthalmos ENT: moist mucous membranes, no thyromegaly, no cervical  lymphadenopathy Cardiovascular: RRR, No MRG Respiratory: CTA B Gastrointestinal: abdomen soft, NT, ND, BS+ Musculoskeletal: no deformities, strength intact in all 4 Skin: moist, warm, no rashes Neurological: no tremor with outstretched hands, DTR normal in all 4  ASSESSMENT: 1. DM2, insulin-dependent, uncontrolled, with complications - PN - on Elavil, B12 vit - CAD  PLAN:  1. Patient with long-standing, uncontrolled diabetes, on metformin + basal insulin,  which is insufficient - We discussed about options for treatment, and I suggested to add Glipizide XL:  Patient Instructions  Please continue: - Metformin 1000 mg 2x a day - Lantus 50 units at bedtime Please add: - Glipizide XL 5 mg in am. Please increase to 10 mg daily if sugars later in the day not <180.  Please return in 1 month with your sugar log.   - Strongly advised her to start checking sugars at different times of the day - check 2-3 times a day, rotating checks - given sugar log and advised how to fill it and to bring it at next appt  - given foot care handout and explained the principles  - given instructions for hypoglycemia management "15-15 rule"  - advised for yearly eye exams >> needs new one - Return to clinic in 1 mo with sugar log  CBG today in office: 247 >> given water.

## 2013-11-27 NOTE — Patient Instructions (Signed)
Please continue: - Metformin 1000 mg 2x a day - Lantus 50 units at bedtime Please add: - Glipizide XL 5 mg in am. Please increase to 10 mg daily if sugars later in the day not <180.  Please return in 1 month with your sugar log.   PATIENT INSTRUCTIONS FOR TYPE 2 DIABETES:  **Please join MyChart!** - see attached instructions about how to join if you have not done so already.  DIET AND EXERCISE Diet and exercise is an important part of diabetic treatment.  We recommended aerobic exercise in the form of brisk walking (working between 40-60% of maximal aerobic capacity, similar to brisk walking) for 150 minutes per week (such as 30 minutes five days per week) along with 3 times per week performing 'resistance' training (using various gauge rubber tubes with handles) 5-10 exercises involving the major muscle groups (upper body, lower body and core) performing 10-15 repetitions (or near fatigue) each exercise. Start at half the above goal but build slowly to reach the above goals. If limited by weight, joint pain, or disability, we recommend daily walking in a swimming pool with water up to waist to reduce pressure from joints while allow for adequate exercise.    BLOOD GLUCOSES Monitoring your blood glucoses is important for continued management of your diabetes. Please check your blood glucoses 2-4 times a day: fasting, before meals and at bedtime (you can rotate these measurements - e.g. one day check before the 3 meals, the next day check before 2 of the meals and before bedtime, etc.).   HYPOGLYCEMIA (low blood sugar) Hypoglycemia is usually a reaction to not eating, exercising, or taking too much insulin/ other diabetes drugs.  Symptoms include tremors, sweating, hunger, confusion, headache, etc. Treat IMMEDIATELY with 15 grams of Carbs:   4 glucose tablets    cup regular juice/soda   2 tablespoons raisins   4 teaspoons sugar   1 tablespoon honey Recheck blood glucose in 15 mins and  repeat above if still symptomatic/blood glucose <100.  RECOMMENDATIONS TO REDUCE YOUR RISK OF DIABETIC COMPLICATIONS: * Take your prescribed MEDICATION(S) * Follow a DIABETIC diet: Complex carbs, fiber rich foods, (monounsaturated and polyunsaturated) fats * AVOID saturated/trans fats, high fat foods, >2,300 mg salt per day. * EXERCISE at least 5 times a week for 30 minutes or preferably daily.  * DO NOT SMOKE OR DRINK more than 1 drink a day. * Check your FEET every day. Do not wear tightfitting shoes. Contact us if you develop an ulcer * See your EYE doctor once a year or more if needed * Get a FLU shot once a year * Get a PNEUMONIA vaccine once before and once after age 54 years  GOALS:  * Your Hemoglobin A1c of <7%  * fasting sugars need to be <130 * after meals sugars need to be <180 (2h after you start eating) * Your Systolic BP should be 161 or lower  * Your Diastolic BP should be 80 or lower  * Your HDL (Good Cholesterol) should be 40 or higher  * Your LDL (Bad Cholesterol) should be 100 or lower. * Your Triglycerides should be 150 or lower  * Your Urine microalbumin (kidney function) should be <30 * Your Body Mass Index should be 25 or lower   We will be glad to help you achieve these goals. Our telephone number is: [phone].

## 2013-12-20 ENCOUNTER — Other Ambulatory Visit: Payer: Self-pay

## 2013-12-20 DIAGNOSIS — E119 Type 2 diabetes mellitus without complications: Secondary | ICD-10-CM

## 2013-12-20 MED ORDER — OXYCODONE-ACETAMINOPHEN 10-325 MG PO TABS: 1.0000 | ORAL_TABLET | Freq: Four times a day (QID) | ORAL | Status: DC | PRN

## 2013-12-20 NOTE — Telephone Encounter (Signed)
Pt request rx oxycodone apap. Call when ready for pick up. 

## 2013-12-20 NOTE — Telephone Encounter (Signed)
Not able to leave VM because it's full, will leave prescription at the front desk when patient calls back.

## 2013-12-21 NOTE — Telephone Encounter (Signed)
Pt called back and notified rx at front desk for pickup.

## 2013-12-22 ENCOUNTER — Encounter: Payer: Self-pay | Admitting: Internal Medicine

## 2013-12-25 ENCOUNTER — Encounter: Payer: Self-pay | Admitting: Internal Medicine

## 2013-12-25 ENCOUNTER — Ambulatory Visit (INDEPENDENT_AMBULATORY_CARE_PROVIDER_SITE_OTHER): Payer: Commercial Managed Care - HMO | Admitting: Internal Medicine

## 2013-12-25 VITALS — BP 140/80 | HR 80 | Temp 98.2°F | Wt 215.0 lb

## 2013-12-25 DIAGNOSIS — E785 Hyperlipidemia, unspecified: Secondary | ICD-10-CM

## 2013-12-25 DIAGNOSIS — M1990 Unspecified osteoarthritis, unspecified site: Secondary | ICD-10-CM

## 2013-12-25 DIAGNOSIS — I10 Essential (primary) hypertension: Secondary | ICD-10-CM

## 2013-12-25 DIAGNOSIS — E1142 Type 2 diabetes mellitus with diabetic polyneuropathy: Secondary | ICD-10-CM

## 2013-12-25 DIAGNOSIS — E1149 Type 2 diabetes mellitus with other diabetic neurological complication: Secondary | ICD-10-CM

## 2013-12-25 NOTE — Progress Notes (Signed)
Pre visit review using our clinic review tool, if applicable. No additional management support is needed unless otherwise documented below in the visit note. 

## 2013-12-25 NOTE — Progress Notes (Signed)
Subjective:    Patient ID: Cynthia Singh, female    DOB: 1941-01-05, 73 y.o.   MRN: [id number]  HPI Doing okay Has done okay with the losartan No chest pain No SOB  No myalgia or GI problems with the generic statin  Having some CMC pain Mostly using this for her neuropathy--also for hand pain and back Limited in walking  Dr Cruzita Lederer added glyburide Still running high --- fasting ~180 still Going back on Friday  Current Outpatient Prescriptions on File Prior to Visit  Medication Sig Dispense Refill  . amitriptyline (ELAVIL) 150 MG tablet TAKE ONE TABLET BY MOUTH EVERY NIGHT AT BEDTIME  30 tablet  11  . aspirin EC 81 MG EC tablet Take 1 tablet (81 mg total) by mouth daily.      Marland Kitchen atorvastatin (LIPITOR) 40 MG tablet Take 1 tablet (40 mg total) by mouth daily.  30 tablet  11  . B-D UF III MINI PEN NEEDLES 31G X 5 MM MISC USE AS DIRECTED WITH LANTUS SOLOSTAR  100 each  11  . carvedilol (COREG) 6.25 MG tablet Take 1 tablet (6.25 mg total) by mouth 2 (two) times daily with a meal.  60 tablet  11  . Cholecalciferol (VITAMIN D3) 1000 UNITS CAPS Take by mouth daily.        . furosemide (LASIX) 40 MG tablet Take 1 tablet (40 mg total) by mouth daily.  30 tablet  3  . Garlic Oil 948 MG CAPS Take by mouth daily.        Marland Kitchen glipiZIDE (GLUCOTROL XL) 5 MG 24 hr tablet Take 2 tablets (10 mg total) by mouth daily with breakfast.  60 tablet  3  . insulin glargine (LANTUS) 100 UNIT/ML injection Inject 50 Units into the skin at bedtime. titrate up the dose as instructed      . Iron-Vit C-Vit B12-Folic Acid (IRON 546 PLUS PO) Take by mouth.      . losartan (COZAAR) 100 MG tablet Take 1 tablet (100 mg total) by mouth daily.  30 tablet  11  . metFORMIN (GLUCOPHAGE) 1000 MG tablet TAKE ONE TABLET BY MOUTH TWICE DAILY WITH A MEAL  60 tablet  5  . nitroGLYCERIN (NITROSTAT) 0.4 MG SL tablet Place 1 tablet (0.4 mg total) under the tongue every 5 (five) minutes as needed for chest pain.  30 tablet  12  .  oxyCODONE-acetaminophen (PERCOCET) 10-325 MG per tablet Take 1-2 tablets by mouth every 6 (six) hours as needed for pain.  240 tablet  0   No current facility-administered medications on file prior to visit.    No Known Allergies  Past Medical History  Diagnosis Date  . Osteoarthritis of spine     knees also  . Hyperlipemia   . Hypertension   . Diabetes mellitus, type 2   . Episodic mood disorder   . CAD (coronary artery disease)     a. 9.2001 Cath: LM nl, LAD 30p, LCX nl, OM1 30-40p, RCA dominant diffuse irregs, EF 60-65%;  b. 05/2010 Lexi MV EF 58%, no isch/infarct;  c. 10/2012 Cath/PCI: LM 25 ost, LAD 21m, D1 60ost, LCX min irregs, RCA dominant, 50p, 95/54m (2.75x32 Promus Premier DES), 40d PD/PL's nl, EF 65%.  . Obesity     Past Surgical History  Procedure Laterality Date  . Gallbladder surgery  1968  . Appendectomy  1959  . Combined hysterectomy abdominal w/ a&p repair / oophorectomy  1968  . Multiple d&c    .  Tonsillectomy  age 56  . Coronary stent placement  2014    Family History  Problem Relation Age of Onset  . Cancer Brother     Colon  . Coronary artery disease Neg Hx   . Heart attack Neg Hx     History   Social History  . Marital Status: Widowed    Spouse Name: N/A    Number of Children: 3  . Years of Education: 12   Occupational History  . CASHIER    Social History Main Topics  . Smoking status: Never Smoker   . Smokeless tobacco: Never Used  . Alcohol Use: No  . Drug Use: No  . Sexual Activity: No   Other Topics Concern  . Not on file   Social History Narrative   Widowed '13. 2 sons- '63, '66; one daughter-'69;    29 grandchildren--has raised 9 of her grandchildren 4 great grandchildren.   Lives with 2 granddaughters and adoptive son      Has living will   Probably would have son Tanice Petre as health care POA   Would accept resuscitation but no prolonged ventilation   No sure about tube feeds   Review of Systems Sleeps okay with the  amitriptylline Does note some dry mouth Some constipation--- uses ex-lax and senna OTC     Objective:   Physical Exam  Constitutional: She appears well-developed and well-nourished. No distress.  Neck: Normal range of motion. Neck supple. No thyromegaly present.  Cardiovascular: Normal rate, regular rhythm, normal heart sounds and intact distal pulses.  Exam reveals no gallop.   No murmur heard. Pulmonary/Chest: Effort normal and breath sounds normal. No respiratory distress. She has no wheezes. She has no rales.  Musculoskeletal: She exhibits no edema.  Some thickening and tenderness bilaterally CMC joints  Lymphadenopathy:    She has no cervical adenopathy.  Psychiatric: She has a normal mood and affect. Her behavior is normal.          Assessment & Plan:

## 2013-12-25 NOTE — Assessment & Plan Note (Signed)
On high dose amitriptylline and oxycodone Will wean the oxycodone

## 2013-12-25 NOTE — Assessment & Plan Note (Signed)
BP Readings from Last 3 Encounters:  12/25/13 140/80  11/27/13 168/66  10/16/13 130/70   Seems to be fine on the losartan Will check renal function

## 2013-12-25 NOTE — Assessment & Plan Note (Signed)
No problems with generic atorvastatin Will check labs

## 2013-12-25 NOTE — Assessment & Plan Note (Signed)
And in thumbs Will have her try capsaicin Reduce oxycodone to no more than 6 per day for now

## 2013-12-25 NOTE — Patient Instructions (Addendum)
Please try senna-S 2 tablets daily (or even twice a day) to try to keep your bowels regular. I will now be giving you a maximum of 180 of the oxycodone per month---so please limit your use to 6 per day. Please try capsaicin cream for your thumbs. Remember that it can sting and burn at first--then usually helps the pain

## 2013-12-25 NOTE — Assessment & Plan Note (Signed)
Hopefully some better Too soon to check Now seeing Dr Cruzita Lederer

## 2013-12-26 LAB — COMPREHENSIVE METABOLIC PANEL
ALT: 24 U/L (ref 0–35)
AST: 23 U/L (ref 0–37)
Albumin: 4.1 g/dL (ref 3.5–5.2)
Alkaline Phosphatase: 73 U/L (ref 39–117)
BUN: 12 mg/dL (ref 6–23)
CO2: 25 mEq/L (ref 19–32)
Calcium: 9.2 mg/dL (ref 8.4–10.5)
Chloride: 101 mEq/L (ref 96–112)
Creatinine, Ser: 0.8 mg/dL (ref 0.4–1.2)
GFR: 75.23 mL/min (ref >60.00)
Glucose, Bld: 182 mg/dL — ABNORMAL HIGH (ref 70–99)
Potassium: 4.7 mEq/L (ref 3.5–5.1)
Sodium: 136 mEq/L (ref 135–145)
Total Bilirubin: 0.3 mg/dL (ref 0.2–1.2)
Total Protein: 7.3 g/dL (ref 6.0–8.3)

## 2013-12-26 LAB — LIPID PANEL
Cholesterol: 124 mg/dL (ref 0–200)
HDL: 32.3 mg/dL — ABNORMAL LOW (ref >39.00)
NonHDL: 91.7
Total CHOL/HDL Ratio: 4
Triglycerides: 274 mg/dL — ABNORMAL HIGH (ref 0.0–149.0)
VLDL: 54.8 mg/dL — ABNORMAL HIGH (ref 0.0–40.0)

## 2013-12-26 LAB — LDL CHOLESTEROL, DIRECT: Direct LDL: 63.7 mg/dL

## 2013-12-27 ENCOUNTER — Encounter: Payer: Self-pay | Admitting: *Deleted

## 2013-12-29 ENCOUNTER — Ambulatory Visit: Payer: Commercial Managed Care - HMO | Admitting: Internal Medicine

## 2014-01-08 ENCOUNTER — Encounter: Payer: Self-pay | Admitting: Internal Medicine

## 2014-01-15 ENCOUNTER — Other Ambulatory Visit: Payer: Self-pay | Admitting: *Deleted

## 2014-01-15 NOTE — Telephone Encounter (Signed)
I cannot refill any medication early even if it was lost. That is part of our controlled substance agreement. I will refill this on Thursday since it will have been a month, but we are cutting her amount down to #180 from now on

## 2014-01-15 NOTE — Telephone Encounter (Signed)
Patient left a voice mail requesting a refill on her Percocet. Patient stated that she has lost her bottle. Last refill 12/20/13 #240. Call when ready for pickup.

## 2014-01-16 NOTE — Telephone Encounter (Signed)
Spoke with patient and advised results   

## 2014-01-18 ENCOUNTER — Other Ambulatory Visit: Payer: Self-pay

## 2014-01-18 MED ORDER — OXYCODONE-ACETAMINOPHEN 10-325 MG PO TABS: 1.0000 | ORAL_TABLET | Freq: Three times a day (TID) | ORAL | Status: DC | PRN

## 2014-01-18 NOTE — Telephone Encounter (Signed)
Spoke with patient and advised rx ready for pick-up and it will be at the front desk.  

## 2014-01-18 NOTE — Telephone Encounter (Signed)
Pt left v/m requesting rx oxycodone apap. Call when ready for pick up.

## 2014-01-25 ENCOUNTER — Other Ambulatory Visit: Payer: Commercial Managed Care - HMO

## 2014-01-26 ENCOUNTER — Ambulatory Visit (INDEPENDENT_AMBULATORY_CARE_PROVIDER_SITE_OTHER): Payer: Commercial Managed Care - HMO | Admitting: Internal Medicine

## 2014-01-26 ENCOUNTER — Encounter: Payer: Self-pay | Admitting: Internal Medicine

## 2014-01-26 VITALS — BP 150/80 | HR 84 | Temp 98.0°F | Wt 209.0 lb

## 2014-01-26 DIAGNOSIS — IMO0002 Reserved for concepts with insufficient information to code with codable children: Secondary | ICD-10-CM | POA: Insufficient documentation

## 2014-01-26 NOTE — Progress Notes (Signed)
Subjective:    Patient ID: Cynthia Singh, female    DOB: November 28, 1940, 73 y.o.   MRN: [id number]  HPI Having problems with back and leg pain She feels this is due to the decrease in the oxycodone  Not sleeping well  Amitriptyline had helped in the past  She felt things had calmed down on the 8 oxycodone a day Has not seen pain management specialist  Current Outpatient Prescriptions on File Prior to Visit  Medication Sig Dispense Refill  . amitriptyline (ELAVIL) 150 MG tablet TAKE ONE TABLET BY MOUTH EVERY NIGHT AT BEDTIME  30 tablet  11  . aspirin EC 81 MG EC tablet Take 1 tablet (81 mg total) by mouth daily.      Marland Kitchen atorvastatin (LIPITOR) 40 MG tablet Take 1 tablet (40 mg total) by mouth daily.  30 tablet  11  . B-D UF III MINI PEN NEEDLES 31G X 5 MM MISC USE AS DIRECTED WITH LANTUS SOLOSTAR  100 each  11  . carvedilol (COREG) 6.25 MG tablet Take 1 tablet (6.25 mg total) by mouth 2 (two) times daily with a meal.  60 tablet  11  . Cholecalciferol (VITAMIN D3) 1000 UNITS CAPS Take by mouth daily.        . furosemide (LASIX) 40 MG tablet Take 1 tablet (40 mg total) by mouth daily.  30 tablet  3  . Garlic Oil 735 MG CAPS Take by mouth daily.        Marland Kitchen glipiZIDE (GLUCOTROL XL) 5 MG 24 hr tablet Take 2 tablets (10 mg total) by mouth daily with breakfast.  60 tablet  3  . insulin glargine (LANTUS) 100 UNIT/ML injection Inject 50 Units into the skin at bedtime. titrate up the dose as instructed      . Iron-Vit C-Vit B12-Folic Acid (IRON 329 PLUS PO) Take by mouth.      . losartan (COZAAR) 100 MG tablet Take 1 tablet (100 mg total) by mouth daily.  30 tablet  11  . metFORMIN (GLUCOPHAGE) 1000 MG tablet TAKE ONE TABLET BY MOUTH TWICE DAILY WITH A MEAL  60 tablet  5  . nitroGLYCERIN (NITROSTAT) 0.4 MG SL tablet Place 1 tablet (0.4 mg total) under the tongue every 5 (five) minutes as needed for chest pain.  30 tablet  12  . oxyCODONE-acetaminophen (PERCOCET) 10-325 MG per tablet Take 1-2 tablets  by mouth 3 (three) times daily as needed for pain.  180 tablet  0   No current facility-administered medications on file prior to visit.    No Known Allergies  Past Medical History  Diagnosis Date  . Osteoarthritis of spine     knees also  . Hyperlipemia   . Hypertension   . Diabetes mellitus, type 2   . Episodic mood disorder   . CAD (coronary artery disease)     a. 9.2001 Cath: LM nl, LAD 30p, LCX nl, OM1 30-40p, RCA dominant diffuse irregs, EF 60-65%;  b. 05/2010 Lexi MV EF 58%, no isch/infarct;  c. 10/2012 Cath/PCI: LM 25 ost, LAD 65m, D1 60ost, LCX min irregs, RCA dominant, 50p, 95/59m (2.75x32 Promus Premier DES), 40d PD/PL's nl, EF 65%.  . Obesity     Past Surgical History  Procedure Laterality Date  . Gallbladder surgery  1968  . Appendectomy  1959  . Combined hysterectomy abdominal w/ a&p repair / oophorectomy  1968  . Multiple d&c    . Tonsillectomy  age 37  . Coronary stent placement  2014    Family History  Problem Relation Age of Onset  . Cancer Brother     Colon  . Coronary artery disease Neg Hx   . Heart attack Neg Hx     History   Social History  . Marital Status: Widowed    Spouse Name: N/A    Number of Children: 3  . Years of Education: 12   Occupational History  . CASHIER    Social History Main Topics  . Smoking status: Never Smoker   . Smokeless tobacco: Never Used  . Alcohol Use: No  . Drug Use: No  . Sexual Activity: No   Other Topics Concern  . Not on file   Social History Narrative   Widowed '13. 2 sons- '63, '66; one daughter-'69;    66 grandchildren--has raised 9 of her grandchildren 4 great grandchildren.   Lives with 2 granddaughters and adoptive son      Has living will   Probably would have son Sibbie Flammia as health care POA   Would accept resuscitation but no prolonged ventilation   No sure about tube feeds   Review of Systems     Objective:   Physical Exam        Assessment & Plan:

## 2014-01-26 NOTE — Assessment & Plan Note (Signed)
Has had plain films with severe disc disease Had been stable on 80mg  daily of oxycodone and the fairly high dose of amitriptyline She really feels worse since I tried to wean her down Discussed this for all of ~15 minute visit Will increase back to 8/day but will set up with physiatrist--Dr Chasnis  Will change her prescription but she is not due for about 14-16 days

## 2014-01-26 NOTE — Progress Notes (Signed)
Pre visit review using our clinic review tool, if applicable. No additional management support is needed unless otherwise documented below in the visit note. 

## 2014-02-05 ENCOUNTER — Other Ambulatory Visit: Payer: Self-pay | Admitting: *Deleted

## 2014-02-06 MED ORDER — OXYCODONE-ACETAMINOPHEN 10-325 MG PO TABS: 1.0000 | ORAL_TABLET | Freq: Four times a day (QID) | ORAL | Status: DC | PRN

## 2014-02-06 NOTE — Telephone Encounter (Signed)
Spoke with patient and advised rx ready for pick-up and it will be at the front desk.  

## 2014-02-12 ENCOUNTER — Ambulatory Visit (INDEPENDENT_AMBULATORY_CARE_PROVIDER_SITE_OTHER): Payer: Commercial Managed Care - HMO | Admitting: Internal Medicine

## 2014-02-12 ENCOUNTER — Other Ambulatory Visit: Payer: Self-pay | Admitting: Internal Medicine

## 2014-02-12 ENCOUNTER — Encounter: Payer: Self-pay | Admitting: Internal Medicine

## 2014-02-12 VITALS — BP 160/96 | HR 80 | Temp 98.1°F | Resp 16 | Wt 212.0 lb

## 2014-02-12 DIAGNOSIS — E1149 Type 2 diabetes mellitus with other diabetic neurological complication: Secondary | ICD-10-CM

## 2014-02-12 MED ORDER — CANAGLIFLOZIN 100 MG PO TABS: 100.0000 mg | ORAL_TABLET | Freq: Every day | ORAL | Status: DC

## 2014-02-12 NOTE — Patient Instructions (Signed)
Please: - Lantus 50 units at bedtime - Metformin 1000 mg 2x a day - Glipizide XL 10 mg in am  Please add; - Invokana 100 mg daily in am  Please return in 1 month with your sugar log.   Please stop at [facility] lab downstairs.

## 2014-02-12 NOTE — Progress Notes (Signed)
Patient ID: Cynthia Singh, female   DOB: 1940-11-21, 73 y.o.   MRN: [id number]  HPI: Cynthia Singh is a 73 y.o.-year-old female, returning for f/u for DM2, dx 1999, insulin-dependent since 2010, uncontrolled, with complications (CAD, PN). Last visit 2.5 mo ago.  Last hemoglobin A1c was: Lab Results  Component Value Date   HGBA1C 10.2* 10/16/2013   HGBA1C 10.5* 05/30/2013   HGBA1C 11.1* 10/19/2012   Pt is on a regimen of: - Metformin 1000 mg po bid - Lantus 50 units qhs - Glipizide XL 5 >> 10 mg in am  Pt checks her sugars 2-3x a day and they are better: - am: 160-170 >> 150-160 - 2h after b'fast: 250s >> 190s, few 200 - before lunch: 200s >> 200s - 2h after lunch: 200s >> 190s - before dinner: 160-170 >> 160-170 - 2h after dinner: 200s >> 180s No lows. Lowest sugar was 150;? if she has hypoglycemia awareness. Highest sugar was 200s, but once 400s.   Pt's meals are: - Breakfast: cheerios, banana, milk  - Lunch: sandwich  - Dinner: pot pie; salmon patties + veggies (cabbage, lima beans) - Snacks: 2: orange, sweets She is walking for exercise  - no CKD, last BUN/creatinine:  Lab Results  Component Value Date   BUN 12 12/25/2013   CREATININE 0.8 12/25/2013  On Losartan. - last set of lipids: Lab Results  Component Value Date   CHOL 124 12/25/2013   HDL 32.30* 12/25/2013   LDLCALC UNABLE TO CALCULATE IF TRIGLYCERIDE OVER 400 mg/dL 10/20/2012   LDLDIRECT 63.7 12/25/2013   TRIG 274.0* 12/25/2013   CHOLHDL 4 12/25/2013  On Atorvastatin, fish oil.  - last eye exam was 2-3 years ago. No DR. She did not have the eye exam this year.  - + numbness and tingling in her feet. On Elavil >> helps.  I reviewed pt's medications, allergies, PMH, social hx, family hx and no changes required, except as mentioned above.  ROS: Constitutional: no weight gain/loss, no fatigue, no subjective hyper- or hypo-thermia Eyes: no blurry vision, no xerophthalmia ENT: no sore throat, no nodules palpated  in throat, no dysphagia/odynophagia, no hoarseness Cardiovascular: no CP/SOB/palpitations/leg swelling Respiratory: no cough/SOB Gastrointestinal: no N/V/D/C/heartburn Musculoskeletal: no muscle/joint aches Skin: no rashes Neurological: no tremors/numbness/tingling/dizziness  PE: BP 160/96  Pulse 80  Temp(Src) 98.1 F (36.7 C) (Oral)  Resp 16  Wt 212 lb (96.163 kg) Wt Readings from Last 3 Encounters:  02/12/14 212 lb (96.163 kg)  01/26/14 209 lb (94.802 kg)  12/25/13 215 lb (97.523 kg)   Constitutional: overweight, in NAD Eyes: PERRLA, EOMI, no exophthalmos ENT: moist mucous membranes, no thyromegaly, no cervical lymphadenopathy Cardiovascular: RRR, No MRG Respiratory: CTA B Gastrointestinal: abdomen soft, NT, ND, BS+ Musculoskeletal: no deformities, strength intact in all 4 Skin: moist, warm, no rashes Neurological: no tremor with outstretched hands, DTR normal in all 4  ASSESSMENT: 1. DM2, insulin-dependent, uncontrolled, with complications - PN - on Elavil, B12 vit - CAD  PLAN:  1. Patient with long-standing, uncontrolled diabetes, on metformin + glipizide + basal insulin, which is insufficient, despite sugars improving after b'fast and dinner. - We discussed about options for treatment, and I suggested to add Invokana:  Patient Instructions  Please: - Lantus 50 units at bedtime - Metformin 1000 mg 2x a day - Glipizide XL 10 mg in am  Please add; - Invokana 100 mg daily in am  Please return in 1 month with your sugar log.   Please stop at  [facility] downstairs. - we discussed about SEs of Invokana, which are: dizziness (advised to be careful when stands from sitting position), decreased BP - usually not < normal (BP today is not low), and fungal UTIs (advised to let me know if develops one).  - given discount card for Invokana - Strongly advised her to start checking sugars at different times of the day - check 2-3 times a day, rotating checks - advised for  yearly eye exams >> needs new one >> advised to reschedule - check A1c today - Return to clinic in 1 mo with sugar log - will need a BMP then.  Orders Only on 02/12/2014  Component Date Value Ref Range Status  . Hemoglobin A1C 02/12/2014 10.9* <5.7 % Final   Comment:                                                                                                 According to the ADA Clinical Practice Recommendations for 2011, when                          HbA1c is used as a screening test:                                                       >=6.5%   Diagnostic of Diabetes Mellitus                                     (if abnormal result is confirmed)                                                     5.7-6.4%   Increased risk of developing Diabetes Mellitus                                                     References:Diagnosis and Classification of Diabetes Mellitus,Diabetes                          JJKK,9381,82(XHBZJ 1):S62-S69 and Standards of Medical Care in                                  Diabetes - 2011,Diabetes IRCV,8938,10 (Suppl 1):S11-S61.                             . Mean Plasma Glucose 02/12/2014 266* <117 mg/dL  Final   HbA1c worse. Will add Invokana as above, but will likely need mealtime insulin soon.

## 2014-02-13 ENCOUNTER — Encounter: Payer: Self-pay | Admitting: *Deleted

## 2014-02-13 LAB — HEMOGLOBIN A1C
Hgb A1c MFr Bld: 10.9 % — ABNORMAL HIGH (ref <5.7)
Mean Plasma Glucose: 266 mg/dL — ABNORMAL HIGH (ref <117)

## 2014-03-06 ENCOUNTER — Other Ambulatory Visit: Payer: Self-pay

## 2014-03-06 NOTE — Telephone Encounter (Signed)
Pt left v/m requesting rx oxycodone apap. Call when ready for pick up.

## 2014-03-07 MED ORDER — OXYCODONE-ACETAMINOPHEN 10-325 MG PO TABS: 1.0000 | ORAL_TABLET | Freq: Four times a day (QID) | ORAL | Status: DC | PRN

## 2014-03-07 NOTE — Telephone Encounter (Signed)
Spoke with patient and advised rx ready for pick-up and it will be at the front desk.  

## 2014-03-20 ENCOUNTER — Other Ambulatory Visit (INDEPENDENT_AMBULATORY_CARE_PROVIDER_SITE_OTHER): Payer: Commercial Managed Care - HMO | Admitting: *Deleted

## 2014-03-20 ENCOUNTER — Encounter: Payer: Self-pay | Admitting: Internal Medicine

## 2014-03-20 ENCOUNTER — Ambulatory Visit (INDEPENDENT_AMBULATORY_CARE_PROVIDER_SITE_OTHER): Payer: Commercial Managed Care - HMO | Admitting: Internal Medicine

## 2014-03-20 VITALS — BP 122/76 | HR 86 | Temp 98.0°F | Resp 12 | Wt 210.0 lb

## 2014-03-20 DIAGNOSIS — E1140 Type 2 diabetes mellitus with diabetic neuropathy, unspecified: Secondary | ICD-10-CM

## 2014-03-20 DIAGNOSIS — E1165 Type 2 diabetes mellitus with hyperglycemia: Secondary | ICD-10-CM

## 2014-03-20 DIAGNOSIS — Z23 Encounter for immunization: Secondary | ICD-10-CM

## 2014-03-20 DIAGNOSIS — IMO0002 Reserved for concepts with insufficient information to code with codable children: Secondary | ICD-10-CM

## 2014-03-20 MED ORDER — INSULIN REGULAR HUMAN 100 UNIT/ML IJ SOLN: 8.0000 [IU] | Freq: Three times a day (TID) | INTRAMUSCULAR | Status: DC

## 2014-03-20 MED ORDER — "INSULIN SYRINGE-NEEDLE U-100 31G X 5/16"" 0.5 ML MISC": Status: DC

## 2014-03-20 MED ORDER — INSULIN NPH (HUMAN) (ISOPHANE) 100 UNIT/ML ~~LOC~~ SUSP: SUBCUTANEOUS | Status: DC

## 2014-03-20 NOTE — Patient Instructions (Addendum)
Please continue: - Metformin 1000 mg 2x a day  Stop: - Lantus - Glipizide - Invokana  Start: Insulin Before breakfast Before lunch Before dinner  Regular 8 with a small meal 10 with a larger meal 8 with a small meal 10 with a larger meal 8 with a small meal 10 with a larger meal  NPH 25  15   Please inject the insulin 30 min before meals.  Please return in 1 month with your sugar log.

## 2014-03-20 NOTE — Progress Notes (Signed)
Patient ID: Cynthia Singh, female   DOB: Mar 02, 1941, 73 y.o.   MRN: [id number]  HPI: Cynthia Singh is a 73 y.o.-year-old female, returning for f/u for DM2, dx 1999, insulin-dependent since 2010, uncontrolled, with complications (CAD, PN). Last visit 1.5 mo ago.  Last hemoglobin A1c was: Lab Results  Component Value Date   HGBA1C 10.9* 02/12/2014   HGBA1C 10.2* 10/16/2013   HGBA1C 10.5* 05/30/2013   Pt is on a regimen of: - Metformin 1000 mg po bid - Lantus 50 units qhs - Glipizide XL 5 >> 10 mg in am - Invokana 100 mg daily in am - added 02/12/2014.  Pt checks her sugars 2-3x a day and they are higher: - am: 160-170 >> 150-160 >> 160-217, 270 - 2h after b'fast: 250s >> 190s, few 200 >> 168-210, 267 - before lunch: 200s >> 200s >> 151-190 - 2h after lunch: 200s >> 190s >> 150-197 - before dinner: 160-170 >> 160-170 >> 150-200 - 2h after dinner: 200s >> 180s >> 173-218 No lows. Lowest sugar was 150;? if she has hypoglycemia awareness. Highest sugar was 200s.  Pt's meals are: - Breakfast: cheerios, banana, milk  - Lunch: sandwich  - Dinner: pot pie; salmon patties + veggies (cabbage, lima beans) - Snacks: 2: orange, sweets She is walking for exercise  - no CKD, last BUN/creatinine:  Lab Results  Component Value Date   BUN 12 12/25/2013   CREATININE 0.8 12/25/2013  On Losartan. - last set of lipids: Lab Results  Component Value Date   CHOL 124 12/25/2013   HDL 32.30* 12/25/2013   LDLCALC UNABLE TO CALCULATE IF TRIGLYCERIDE OVER 400 mg/dL 10/20/2012   LDLDIRECT 63.7 12/25/2013   TRIG 274.0* 12/25/2013   CHOLHDL 4 12/25/2013  On Atorvastatin, fish oil.  - last eye exam was 2-3 years ago. No DR. She did not have the eye exam this year.  - + numbness and tingling in her feet. On Elavil >> helps.  I reviewed pt's medications, allergies, PMH, social hx, family hx and no changes required, except as mentioned above.  ROS: Constitutional: no weight gain/loss, no  fatigue, no subjective hyper- or hypo-thermia Eyes: no blurry vision, no xerophthalmia ENT: no sore throat, no nodules palpated in throat, no dysphagia/odynophagia, no hoarseness Cardiovascular: no CP/SOB/palpitations/leg swelling Respiratory: no cough/SOB Gastrointestinal: no N/V/D/C/heartburn Musculoskeletal: no muscle/joint aches Skin: no rashes Neurological: no tremors/numbness/tingling/dizziness  PE: BP 122/76 mmHg  Pulse 86  Temp(Src) 98 F (36.7 C) (Oral)  Resp 12  Wt 210 lb (95.255 kg)  SpO2 95% Wt Readings from Last 3 Encounters:  03/20/14 210 lb (95.255 kg)  02/12/14 212 lb (96.163 kg)  01/26/14 209 lb (94.802 kg)   Constitutional: overweight, in NAD Eyes: PERRLA, EOMI, no exophthalmos ENT: moist mucous membranes, no thyromegaly, no cervical lymphadenopathy Cardiovascular: RRR, No MRG Respiratory: CTA B Gastrointestinal: abdomen soft, NT, ND, BS+ Musculoskeletal: no deformities, strength intact in all 4 Skin: moist, warm, no rashes Neurological: no tremor with outstretched hands, DTR normal in all 4  ASSESSMENT: 1. DM2, insulin-dependent, uncontrolled, with complications - PN - on Elavil, B12 vit - CAD  PLAN:  1. Patient with long-standing, uncontrolled diabetes, on metformin + glipizide + Invokana basal insulin, which is insufficient. - We discussed about options for treatment, and I suggested to:  Patient Instructions   Please continue: - Metformin 1000 mg 2x a day  Stop: - Lantus - Glipizide - Invokana  Start: Insulin Before breakfast Before lunch Before dinner  Regular 8  with a small meal 10 with a larger meal 8 with a small meal 10 with a larger meal 8 with a small meal 10 with a larger meal  NPH 25  15   Please inject the insulin 30 min before meals.  Please return in 1 month with your sugar log.   - continue checking sugars at different times of the day - check 3 times a day, rotating checks - advised for yearly eye exams >> needs new  one >> advised to reschedule - Return to clinic in 1 mo with sugar log

## 2014-03-22 ENCOUNTER — Telehealth: Payer: Self-pay | Admitting: *Deleted

## 2014-03-22 ENCOUNTER — Telehealth: Payer: Self-pay | Admitting: Internal Medicine

## 2014-03-22 NOTE — Telephone Encounter (Signed)
Please read note below and advise.  

## 2014-03-22 NOTE — Telephone Encounter (Signed)
I tried to call the pt but VM full. The Rx'es for the NPH and Regular insulin has been sent to [facility]. On 11/03 and the receipt was confirmed by the pharmacy ~ 4 pm that day. Please let pt know. Did she go to another pharmacy?

## 2014-03-22 NOTE — Telephone Encounter (Signed)
Please

## 2014-03-22 NOTE — Telephone Encounter (Signed)
Patient stated that she went to the pharmacy to pick up her prescription, and it haven't been faxed yet.

## 2014-03-23 NOTE — Telephone Encounter (Signed)
Attempted to call pt; voicemail is full

## 2014-03-29 ENCOUNTER — Other Ambulatory Visit: Payer: Self-pay | Admitting: Physical Medicine and Rehabilitation

## 2014-03-29 DIAGNOSIS — M545 Low back pain: Secondary | ICD-10-CM

## 2014-03-30 NOTE — Telephone Encounter (Signed)
Called pharmacy, pt has picked up both medications.

## 2014-04-05 ENCOUNTER — Other Ambulatory Visit: Payer: Self-pay

## 2014-04-05 MED ORDER — OXYCODONE-ACETAMINOPHEN 10-325 MG PO TABS: 1.0000 | ORAL_TABLET | Freq: Four times a day (QID) | ORAL | Status: DC | PRN

## 2014-04-05 NOTE — Telephone Encounter (Signed)
Spoke with patient and advised rx ready for pick-up and it will be at the front desk.  

## 2014-04-05 NOTE — Telephone Encounter (Signed)
Pt left v/m requesting rx oxycodone apap. Call when ready for pick up.

## 2014-04-08 ENCOUNTER — Ambulatory Visit
Admission: RE | Admit: 2014-04-08 | Discharge: 2014-04-08 | Disposition: A | Discharge status: Home / Self Care | Payer: Commercial Managed Care - HMO | Source: Ambulatory Visit | Attending: Physical Medicine and Rehabilitation | Admitting: Physical Medicine and Rehabilitation

## 2014-04-08 DIAGNOSIS — M545 Low back pain: Secondary | ICD-10-CM

## 2014-04-26 ENCOUNTER — Encounter (HOSPITAL_COMMUNITY): Payer: Self-pay | Admitting: Cardiovascular Disease

## 2014-04-30 ENCOUNTER — Ambulatory Visit (INDEPENDENT_AMBULATORY_CARE_PROVIDER_SITE_OTHER): Payer: Commercial Managed Care - HMO | Admitting: Internal Medicine

## 2014-04-30 ENCOUNTER — Encounter: Payer: Self-pay | Admitting: Internal Medicine

## 2014-04-30 VITALS — BP 160/80 | HR 75 | Temp 98.2°F | Ht 68.0 in | Wt 205.0 lb

## 2014-04-30 DIAGNOSIS — Z7189 Other specified counseling: Secondary | ICD-10-CM

## 2014-04-30 DIAGNOSIS — Z1211 Encounter for screening for malignant neoplasm of colon: Secondary | ICD-10-CM

## 2014-04-30 DIAGNOSIS — E1142 Type 2 diabetes mellitus with diabetic polyneuropathy: Secondary | ICD-10-CM

## 2014-04-30 DIAGNOSIS — E1140 Type 2 diabetes mellitus with diabetic neuropathy, unspecified: Secondary | ICD-10-CM

## 2014-04-30 DIAGNOSIS — Z0000 Encounter for general adult medical examination without abnormal findings: Secondary | ICD-10-CM

## 2014-04-30 DIAGNOSIS — E1165 Type 2 diabetes mellitus with hyperglycemia: Secondary | ICD-10-CM

## 2014-04-30 DIAGNOSIS — M4725 Other spondylosis with radiculopathy, thoracolumbar region: Secondary | ICD-10-CM

## 2014-04-30 DIAGNOSIS — IMO0002 Reserved for concepts with insufficient information to code with codable children: Secondary | ICD-10-CM

## 2014-04-30 DIAGNOSIS — I2510 Atherosclerotic heart disease of native coronary artery without angina pectoris: Secondary | ICD-10-CM

## 2014-04-30 MED ORDER — ZOSTER VACCINE LIVE 19400 UNT/0.65ML ~~LOC~~ SOLR: 0.6500 mL | Freq: Once | SUBCUTANEOUS | Status: DC

## 2014-04-30 MED ORDER — OXYCODONE-ACETAMINOPHEN 10-325 MG PO TABS: 1.0000 | ORAL_TABLET | Freq: Four times a day (QID) | ORAL | Status: DC | PRN

## 2014-04-30 NOTE — Assessment & Plan Note (Signed)
Working with Dr Cruzita Lederer but still having troubles

## 2014-04-30 NOTE — Assessment & Plan Note (Signed)
No symptoms On statin, aspirin, ARB and beta blocker

## 2014-04-30 NOTE — Assessment & Plan Note (Signed)
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

## 2014-04-30 NOTE — Progress Notes (Signed)
Subjective:    Patient ID: Cynthia Singh, female    DOB: 09-21-1940, 73 y.o.   MRN: [id number]  HPI Here for first Medicare wellness and follow up of multiple chronic medical conditions Reviewed form and advanced directives Reviewed other doctors Mild blurry vision--overdue for eye exam for diabetes Hearing is fine No tobacco or alcohol Tries to walk fairly regularly Independent in instrumental ADLs No falls No depression or anhedonia No memory problems  Sugars still up and down Thinks they may be some better Pain and numbness in legs/feet and hands Does okay with the oxycodone and amitriptyline  Ongoing arthritis No separate meds for this other than ibuprofen (200 bid at most)  No chest pain No SOB No dizziness or syncope No edema  Current Outpatient Prescriptions on File Prior to Visit  Medication Sig Dispense Refill  . amitriptyline (ELAVIL) 150 MG tablet TAKE ONE TABLET BY MOUTH EVERY NIGHT AT BEDTIME 30 tablet 11  . aspirin EC 81 MG EC tablet Take 1 tablet (81 mg total) by mouth daily.    Marland Kitchen atorvastatin (LIPITOR) 40 MG tablet Take 1 tablet (40 mg total) by mouth daily. 30 tablet 11  . B-D UF III MINI PEN NEEDLES 31G X 5 MM MISC USE AS DIRECTED WITH LANTUS SOLOSTAR 100 each 11  . carvedilol (COREG) 6.25 MG tablet Take 1 tablet (6.25 mg total) by mouth 2 (two) times daily with a meal. 60 tablet 11  . Cholecalciferol (VITAMIN D3) 1000 UNITS CAPS Take by mouth daily.      . furosemide (LASIX) 40 MG tablet Take 1 tablet (40 mg total) by mouth daily. 30 tablet 3  . Garlic Oil 924 MG CAPS Take by mouth daily.      . insulin NPH Human (NOVOLIN N RELION) 100 UNIT/ML injection Inject 40 units a day as advised 20 mL 2  . insulin regular (NOVOLIN R RELION) 100 units/mL injection Inject 0.08-0.1 mLs (8-10 Units total) into the skin 3 (three) times daily before meals. 10 mL 2  . Insulin Syringe-Needle U-100 (RELION INSULIN SYR 0.5ML/31G) 31G X 5/16" 0.5 ML MISC Use 3x a day 200  each 6  . Iron-Vit C-Vit B12-Folic Acid (IRON 268 PLUS PO) Take by mouth.    . losartan (COZAAR) 100 MG tablet Take 1 tablet (100 mg total) by mouth daily. 30 tablet 11  . metFORMIN (GLUCOPHAGE) 1000 MG tablet TAKE ONE TABLET BY MOUTH TWICE DAILY WITH A MEAL 60 tablet 5  . nitroGLYCERIN (NITROSTAT) 0.4 MG SL tablet Place 1 tablet (0.4 mg total) under the tongue every 5 (five) minutes as needed for chest pain. 30 tablet 12  . oxyCODONE-acetaminophen (PERCOCET) 10-325 MG per tablet Take 1-2 tablets by mouth 4 (four) times daily as needed for pain. 240 tablet 0   No current facility-administered medications on file prior to visit.    No Known Allergies  Past Medical History  Diagnosis Date  . Osteoarthritis of spine     knees also  . Hyperlipemia   . Hypertension   . Diabetes mellitus, type 2   . Episodic mood disorder   . CAD (coronary artery disease)     a. 9.2001 Cath: LM nl, LAD 30p, LCX nl, OM1 30-40p, RCA dominant diffuse irregs, EF 60-65%;  b. 05/2010 Lexi MV EF 58%, no isch/infarct;  c. 10/2012 Cath/PCI: LM 25 ost, LAD 19m, D1 60ost, LCX min irregs, RCA dominant, 50p, 95/13m (2.75x32 Promus Premier DES), 40d PD/PL's nl, EF 65%.  . Obesity  Past Surgical History  Procedure Laterality Date  . Gallbladder surgery  1968  . Appendectomy  1959  . Combined hysterectomy abdominal w/ a&p repair / oophorectomy  1968  . Multiple d&c    . Tonsillectomy  age 33  . Coronary stent placement  2014  . Left heart catheterization with coronary angiogram N/A 10/20/2012    Procedure: LEFT HEART CATHETERIZATION WITH CORONARY ANGIOGRAM;  Surgeon: Sherren Mocha, MD;  Location: [facility] CATH LAB;  Service: Cardiovascular;  Laterality: N/A;    Family History  Problem Relation Age of Onset  . Cancer Brother     Colon  . Coronary artery disease Neg Hx   . Heart attack Neg Hx     History   Social History  . Marital Status: Widowed    Spouse Name: N/A    Number of Children: 3  . Years of  Education: 12   Occupational History  . CASHIER    Social History Main Topics  . Smoking status: Never Smoker   . Smokeless tobacco: Never Used  . Alcohol Use: No  . Drug Use: No  . Sexual Activity: No   Other Topics Concern  . Not on file   Social History Narrative   Widowed '13. 2 sons- '63, '66; one daughter-'69;    75 grandchildren--has raised 9 of her grandchildren 4 great grandchildren.   Lives with 2 granddaughters and adoptive son      Has living will   Probably would have son Starr Urias as health care POA   Would accept resuscitation but no prolonged ventilation   Not sure about tube feeds   Review of Systems Sleeps well with the amitriptyline No sig problems with appetite Bowels are okay with the senna s 3 per day. Does get some blood on toilet paper Voids okay    Objective:   Physical Exam  Constitutional: She is oriented to person, place, and time. She appears well-developed and well-nourished. No distress.  HENT:  Mouth/Throat: No oropharyngeal exudate.  Full dentures  Neck: Normal range of motion. Neck supple. No thyromegaly present.  Cardiovascular: Normal rate, regular rhythm, normal heart sounds and intact distal pulses.  Exam reveals no gallop.   No murmur heard. Pulmonary/Chest: Effort normal and breath sounds normal. No respiratory distress. She has no wheezes. She has no rales.  Abdominal: Soft. There is no tenderness.  Musculoskeletal: She exhibits no edema.  Hard lying down and getting up due to back pain  Lymphadenopathy:    She has no cervical adenopathy.  Neurological: She is alert and oriented to person, place, and time.  President-- "Obama, ??" [id number]-[id number] Recall 2/3  Skin: No rash noted.  No foot lesions  Psychiatric: She has a normal mood and affect. Her behavior is normal.          Assessment & Plan:

## 2014-04-30 NOTE — Assessment & Plan Note (Signed)
See social history Forms done

## 2014-04-30 NOTE — Patient Instructions (Signed)
Please set up your eye exam. 

## 2014-04-30 NOTE — Assessment & Plan Note (Signed)
I have personally reviewed the Medicare Annual Wellness questionnaire and have noted 1. The patient's medical and social history 2. Their use of alcohol, tobacco or illicit drugs 3. Their current medications and supplements 4. The patient's functional ability including ADL's, fall risks, home safety risks and hearing or visual             impairment. 5. Diet and physical activities 6. Evidence for depression or mood disorders  The patients weight, height, BMI and visual acuity have been recorded in the chart I have made referrals, counseling and provided education to the patient based review of the above and I have provided the pt with a written personalized care plan for preventive services.  I have provided you with a copy of your personalized plan for preventive services. Please take the time to review along with your updated medication list.  Rx for zostavax She prefers no mammogram or breast exam Has some probably benign blood in stool--but will set up colonoscopy

## 2014-04-30 NOTE — Assessment & Plan Note (Signed)
Fairly severe On amitriptyline and narcotics  Satisfied though has ongoing pain

## 2014-04-30 NOTE — Progress Notes (Signed)
Pre visit review using our clinic review tool, if applicable. No additional management support is needed unless otherwise documented below in the visit note. 

## 2014-05-01 ENCOUNTER — Ambulatory Visit: Payer: Commercial Managed Care - HMO | Admitting: Internal Medicine

## 2014-05-31 ENCOUNTER — Other Ambulatory Visit: Payer: Self-pay

## 2014-05-31 MED ORDER — OXYCODONE-ACETAMINOPHEN 10-325 MG PO TABS: 1.0000 | ORAL_TABLET | Freq: Four times a day (QID) | ORAL | Status: DC | PRN

## 2014-05-31 NOTE — Telephone Encounter (Signed)
Pt left v/m requesting rx oxycodone apap. Call when ready for pick up.

## 2014-05-31 NOTE — Telephone Encounter (Signed)
Mailbox is full, could not leave message, will leave message up front

## 2014-06-01 ENCOUNTER — Encounter: Payer: Self-pay | Admitting: Internal Medicine

## 2014-06-01 NOTE — Telephone Encounter (Signed)
Pt called to ck on status of oxycodone apap rx. Found rx on Dee's desk and pt will pick up at front desk.(envelope placed in box at front desk).

## 2014-06-28 ENCOUNTER — Other Ambulatory Visit: Payer: Self-pay

## 2014-06-28 NOTE — Telephone Encounter (Signed)
Pt left v/m requesting rx oxycodone apap. Call when ready for pick up. Pt seen 04/30/14 and rx last printed 05/31/14.

## 2014-06-29 ENCOUNTER — Encounter: Payer: Self-pay | Admitting: Internal Medicine

## 2014-06-29 MED ORDER — OXYCODONE-ACETAMINOPHEN 10-325 MG PO TABS: 1.0000 | ORAL_TABLET | Freq: Four times a day (QID) | ORAL | Status: DC | PRN

## 2014-06-29 NOTE — Telephone Encounter (Signed)
Spoke to pt and informed her Rx is available for pickup at the front desk; pt advised third party unable to pickup

## 2014-07-09 ENCOUNTER — Other Ambulatory Visit: Payer: Self-pay

## 2014-07-09 MED ORDER — AMITRIPTYLINE HCL 150 MG PO TABS: 150.0000 mg | ORAL_TABLET | Freq: Every day | ORAL | Status: DC

## 2014-07-09 NOTE — Telephone Encounter (Signed)
Pt left v/m requesting refill amitriptyline to [facility]. Pt is out of med. Pt request cb when refilled. Pt had annual exam on 04/30/2014.

## 2014-07-09 NOTE — Telephone Encounter (Signed)
Amitriptyline sent to [facility].  Patient aware.

## 2014-07-11 ENCOUNTER — Encounter: Payer: Self-pay | Admitting: Internal Medicine

## 2014-07-27 ENCOUNTER — Other Ambulatory Visit: Payer: Self-pay

## 2014-07-27 MED ORDER — OXYCODONE-ACETAMINOPHEN 10-325 MG PO TABS: 1.0000 | ORAL_TABLET | Freq: Four times a day (QID) | ORAL | Status: DC | PRN

## 2014-07-27 NOTE — Telephone Encounter (Signed)
Patient informed oxycodone rx is ready for pick up. 

## 2014-07-27 NOTE — Telephone Encounter (Signed)
Pt left v/m requesting rx oxycodone apap. Call when ready for pick up. Pt last seen 04/30/14.

## 2014-07-27 NOTE — Telephone Encounter (Signed)
Px printed for pick up in IN box  

## 2014-08-07 ENCOUNTER — Encounter: Payer: Commercial Managed Care - HMO | Admitting: Internal Medicine

## 2014-08-22 ENCOUNTER — Other Ambulatory Visit: Payer: Self-pay | Admitting: *Deleted

## 2014-08-22 MED ORDER — METFORMIN HCL 1000 MG PO TABS: 1000.0000 mg | ORAL_TABLET | Freq: Two times a day (BID) | ORAL | Status: DC

## 2014-08-27 ENCOUNTER — Other Ambulatory Visit: Payer: Self-pay

## 2014-08-27 MED ORDER — OXYCODONE-ACETAMINOPHEN 10-325 MG PO TABS: 1.0000 | ORAL_TABLET | Freq: Four times a day (QID) | ORAL | Status: DC | PRN

## 2014-08-27 NOTE — Telephone Encounter (Signed)
Spoke with patient and advised rx ready for pick-up and it will be at the front desk.  

## 2014-08-27 NOTE — Telephone Encounter (Signed)
Pt left v/m requesting rx oxycodone apap. Call when ready for pick up. pts last annual exam 04/30/14.Please advise.

## 2014-09-06 ENCOUNTER — Telehealth: Payer: Self-pay | Admitting: *Deleted

## 2014-09-06 NOTE — Telephone Encounter (Signed)
Received fax asking for prior auth for AMITRIPTYLIN 150 mg tab, tried to prior auth on cover my meds and we have the wrong insurance card on file. I called [facility] to get the correct card and they do not show prior auth needed, per Pilar Plate she's just trying to fill to early. Per Ebony Hail at the front desk pt is now on [employer] advantage but it's still not showing eligibility. Will call pt and ask her to bring correct insurance card to the office.   Spoke with patient and explained that we need her current card and she stated she will bring it by.

## 2014-09-24 ENCOUNTER — Other Ambulatory Visit: Payer: Self-pay

## 2014-09-24 NOTE — Telephone Encounter (Signed)
Pt left v/m requesting rx oxycodone apap; call when ready for pick up. Pt last seen 04/30/14; rx last printed # 240 on 08/27/14.

## 2014-09-25 MED ORDER — OXYCODONE-ACETAMINOPHEN 10-325 MG PO TABS: 1.0000 | ORAL_TABLET | Freq: Four times a day (QID) | ORAL | Status: DC | PRN

## 2014-09-25 NOTE — Telephone Encounter (Signed)
Spoke with patient and advised rx ready for pick-up and it will be at the front desk.  

## 2014-10-24 ENCOUNTER — Other Ambulatory Visit: Payer: Self-pay

## 2014-10-24 ENCOUNTER — Telehealth: Payer: Self-pay | Admitting: Internal Medicine

## 2014-10-24 DIAGNOSIS — E1140 Type 2 diabetes mellitus with diabetic neuropathy, unspecified: Secondary | ICD-10-CM

## 2014-10-24 DIAGNOSIS — E1165 Type 2 diabetes mellitus with hyperglycemia: Principal | ICD-10-CM

## 2014-10-24 DIAGNOSIS — IMO0002 Reserved for concepts with insufficient information to code with codable children: Secondary | ICD-10-CM

## 2014-10-24 MED ORDER — OXYCODONE-ACETAMINOPHEN 10-325 MG PO TABS: 1.0000 | ORAL_TABLET | Freq: Four times a day (QID) | ORAL | Status: DC | PRN

## 2014-10-24 NOTE — Telephone Encounter (Signed)
Left message on machine that rx is ready for pick-up, and it will be at our front desk.  

## 2014-10-24 NOTE — Telephone Encounter (Signed)
Pt left v/m requesting rx oxycodone apap. Call when ready for pick up. Last annual exam 04/30/14; rx printed # 240 on 09/25/14.

## 2014-10-24 NOTE — Telephone Encounter (Signed)
Referral made 

## 2014-10-24 NOTE — Telephone Encounter (Signed)
Pt said that she would like a referral to an eye doctor. She states that she had an appt previously, but did not keep it and now her eyes are worse. Please call at home number thanks

## 2014-11-22 ENCOUNTER — Other Ambulatory Visit: Payer: Self-pay

## 2014-11-22 MED ORDER — OXYCODONE-ACETAMINOPHEN 10-325 MG PO TABS: 1.0000 | ORAL_TABLET | Freq: Four times a day (QID) | ORAL | Status: DC | PRN

## 2014-11-22 NOTE — Telephone Encounter (Signed)
Pt left v/m requesting rx oxycodone apap 1-325 mg. Call when ready for pick up. Last seen 04/30/14 and rx last printed # 240 on 10/24/14.Please advise.

## 2014-11-23 NOTE — Telephone Encounter (Signed)
Spoke with patient and advised rx ready for pick-up and it will be at the front desk.  

## 2014-12-06 ENCOUNTER — Telehealth: Payer: Self-pay

## 2014-12-06 NOTE — Telephone Encounter (Signed)
Was unable to leave a message to notify patient about being due for a Mammogram.

## 2014-12-07 NOTE — Telephone Encounter (Signed)
Pt called back. Pt has never had a MM before. Gave her [facility] phone number. Pt states she will call and set up screening MM.

## 2014-12-17 ENCOUNTER — Other Ambulatory Visit: Payer: Self-pay | Admitting: Internal Medicine

## 2014-12-21 ENCOUNTER — Other Ambulatory Visit: Payer: Self-pay

## 2014-12-21 MED ORDER — OXYCODONE-ACETAMINOPHEN 10-325 MG PO TABS: 1.0000 | ORAL_TABLET | Freq: Four times a day (QID) | ORAL | Status: DC | PRN

## 2014-12-21 NOTE — Telephone Encounter (Signed)
Spoke with patient and advised rx ready for pick-up and it will be at the front desk.  

## 2014-12-21 NOTE — Telephone Encounter (Signed)
Pt left v/m requesting refill oxycodone apap. Call when ready for pick up. Pt last seen annual exam 04/30/14 and rx last printed # 240 on 11/22/14.

## 2014-12-23 ENCOUNTER — Emergency Department: Payer: PPO

## 2014-12-23 ENCOUNTER — Encounter: Payer: Self-pay | Admitting: Emergency Medicine

## 2014-12-23 ENCOUNTER — Emergency Department
Admission: EM | Admit: 2014-12-23 | Discharge: 2014-12-23 | Disposition: A | Discharge status: Home / Self Care | Payer: PPO | Attending: Emergency Medicine | Admitting: Emergency Medicine

## 2014-12-23 DIAGNOSIS — E1142 Type 2 diabetes mellitus with diabetic polyneuropathy: Secondary | ICD-10-CM | POA: Diagnosis not present

## 2014-12-23 DIAGNOSIS — Z7982 Long term (current) use of aspirin: Secondary | ICD-10-CM | POA: Insufficient documentation

## 2014-12-23 DIAGNOSIS — I10 Essential (primary) hypertension: Secondary | ICD-10-CM | POA: Insufficient documentation

## 2014-12-23 DIAGNOSIS — M19041 Primary osteoarthritis, right hand: Secondary | ICD-10-CM | POA: Diagnosis not present

## 2014-12-23 DIAGNOSIS — Z79891 Long term (current) use of opiate analgesic: Secondary | ICD-10-CM | POA: Insufficient documentation

## 2014-12-23 DIAGNOSIS — M79641 Pain in right hand: Secondary | ICD-10-CM | POA: Diagnosis present

## 2014-12-23 DIAGNOSIS — Z79899 Other long term (current) drug therapy: Secondary | ICD-10-CM | POA: Diagnosis not present

## 2014-12-23 DIAGNOSIS — Z794 Long term (current) use of insulin: Secondary | ICD-10-CM | POA: Diagnosis not present

## 2014-12-23 DIAGNOSIS — G629 Polyneuropathy, unspecified: Secondary | ICD-10-CM | POA: Diagnosis not present

## 2014-12-23 MED ORDER — NABUMETONE 500 MG PO TABS: 500.0000 mg | ORAL_TABLET | Freq: Every day | ORAL | Status: DC

## 2014-12-23 NOTE — ED Notes (Signed)
Pt reports pain to right hand since Friday, denies any trauma to area. Pt talks in complete sentences no respiratory distress noted.

## 2014-12-23 NOTE — ED Provider Notes (Signed)
[facility] Emergency Department Provider Note  ____________________________________________  Time seen: 11:43 AM  I have reviewed the triage vital signs and the nursing notes.   HISTORY  Chief Complaint Hand Pain   HPI Cynthia Singh is a 74 y.o. female is here today with complaint of right hand swelling 2 days. She states that she is unaware of any injury to her hand. She denies any previous injuries or history of gout. She reports that she is taking Percocet without any relief. Pain is localized over the radial aspect of her right hand.Patient rates her pain a 7 out of 10 at present. Pain is worse with movement, nothing seems to help.   Past Medical History  Diagnosis Date  . Osteoarthritis of spine     knees also  . Hyperlipemia   . Hypertension   . Diabetes mellitus, type 2   . Episodic mood disorder   . CAD (coronary artery disease)     a. 9.2001 Cath: LM nl, LAD 30p, LCX nl, OM1 30-40p, RCA dominant diffuse irregs, EF 60-65%;  b. 05/2010 Lexi MV EF 58%, no isch/infarct;  c. 10/2012 Cath/PCI: LM 25 ost, LAD 48m, D1 60ost, LCX min irregs, RCA dominant, 50p, 95/68m (2.75x32 Promus Premier DES), 40d PD/PL's nl, EF 65%.  . Obesity     Patient Active Problem List   Diagnosis Date Noted  . Preventative health care 04/30/2014  . Advanced directives, counseling/discussion 04/30/2014  . Degeneration of thoracic or thoracolumbar intervertebral disc 01/26/2014  . Diabetic polyneuropathy 10/16/2013  . CAD (coronary artery disease) 10/21/2012  . Obesity   . KNEE PAIN, LEFT, CHRONIC 02/25/2010  . Type 2 diabetes, uncontrolled, with neuropathy 12/30/2009  . HYPERLIPIDEMIA 12/30/2009  . Episodic mood disorder 12/30/2009  . HYPERTENSION 12/30/2009  . Osteoarthritis of back 12/30/2009    Past Surgical History  Procedure Laterality Date  . Gallbladder surgery  1968  . Appendectomy  1959  . Combined hysterectomy abdominal w/ a&p repair / oophorectomy   1968  . Multiple d&c    . Tonsillectomy  age 39  . Coronary stent placement  2014  . Left heart catheterization with coronary angiogram N/A 10/20/2012    Procedure: LEFT HEART CATHETERIZATION WITH CORONARY ANGIOGRAM;  Surgeon: Sherren Mocha, MD;  Location: [facility] CATH LAB;  Service: Cardiovascular;  Laterality: N/A;  . Cholecystectomy      Current Outpatient Rx  Name  Route  Sig  Dispense  Refill  . amitriptyline (ELAVIL) 150 MG tablet   Oral   Take 1 tablet (150 mg total) by mouth at bedtime.   90 tablet   3   . aspirin EC 81 MG EC tablet   Oral   Take 1 tablet (81 mg total) by mouth daily.         Marland Kitchen atorvastatin (LIPITOR) 40 MG tablet   Oral   Take 1 tablet (40 mg total) by mouth daily.   30 tablet   11   . B-D UF III MINI PEN NEEDLES 31G X 5 MM MISC      USE AS DIRECTED WITH LANTUS SOLOSTAR   100 each   11   . carvedilol (COREG) 6.25 MG tablet   Oral   Take 1 tablet (6.25 mg total) by mouth 2 (two) times daily with a meal.   60 tablet   11   . Cholecalciferol (VITAMIN D3) 1000 UNITS CAPS   Oral   Take by mouth daily.           Marland Kitchen  furosemide (LASIX) 40 MG tablet   Oral   Take 1 tablet (40 mg total) by mouth daily.   30 tablet   3   . Garlic Oil 478 MG CAPS   Oral   Take by mouth daily.           . insulin NPH Human (NOVOLIN N RELION) 100 UNIT/ML injection      Inject 40 units a day as advised   20 mL   2   . insulin regular (NOVOLIN R RELION) 100 units/mL injection   Subcutaneous   Inject 0.08-0.1 mLs (8-10 Units total) into the skin 3 (three) times daily before meals.   10 mL   2   . Insulin Syringe-Needle U-100 (RELION INSULIN SYR 0.5ML/31G) 31G X 5/16" 0.5 ML MISC      Use 3x a day   200 each   6   . Iron-Vit C-Vit B12-Folic Acid (IRON 295 PLUS PO)   Oral   Take by mouth.         . losartan (COZAAR) 100 MG tablet      TAKE 1 TABLET BY MOUTH DAILY   90 tablet   1   . metFORMIN (GLUCOPHAGE) 1000 MG tablet   Oral   Take 1 tablet  (1,000 mg total) by mouth 2 (two) times daily with a meal.   60 tablet   5   . nabumetone (RELAFEN) 500 MG tablet   Oral   Take 1 tablet (500 mg total) by mouth daily.   20 tablet   0   . nitroGLYCERIN (NITROSTAT) 0.4 MG SL tablet   Sublingual   Place 1 tablet (0.4 mg total) under the tongue every 5 (five) minutes as needed for chest pain.   30 tablet   12   . oxyCODONE-acetaminophen (PERCOCET) 10-325 MG per tablet   Oral   Take 1-2 tablets by mouth 4 (four) times daily as needed for pain.   240 tablet   0   . zoster vaccine live, PF, (ZOSTAVAX) 62130 UNT/0.65ML injection   Subcutaneous   Inject 19,400 Units into the skin once.   1 each   0     Allergies Review of patient's allergies indicates no known allergies.  Family History  Problem Relation Age of Onset  . Cancer Brother     Colon  . Coronary artery disease Neg Hx   . Heart attack Neg Hx     Social History History  Substance Use Topics  . Smoking status: Never Smoker   . Smokeless tobacco: Never Used  . Alcohol Use: No    Review of Systems Constitutional: No fever/chills Cardiovascular: Denies chest pain. Respiratory: Denies shortness of breath. Gastrointestinal: No abdominal pain.  No nausea, no vomiting. Musculoskeletal: Negative for back pain. Skin: Negative for rash. Neurological: Negative for headaches, focal weakness or numbness.  10-point ROS otherwise negative.  ____________________________________________   PHYSICAL EXAM:  VITAL SIGNS: ED Triage Vitals  Enc Vitals Group     BP 12/23/14 1113 161/69 mmHg     Pulse Rate 12/23/14 1113 85     Resp 12/23/14 1113 20     Temp 12/23/14 1113 98.1 F (36.7 C)     Temp Source 12/23/14 1113 Oral     SpO2 12/23/14 1113 99 %     Weight 12/23/14 1113 180 lb (81.647 kg)     Height 12/23/14 1113 5\' 8"  (1.727 m)     Head Cir --      Peak  Flow --      Pain Score 12/23/14 1113 7     Pain Loc --      Pain Edu? --      Excl. in [place]? --      Constitutional: Alert and oriented. Well appearing and in no acute distress. Eyes: Conjunctivae are normal. PERRL. EOMI. Head: Atraumatic. Nose: No congestion/rhinnorhea. Neck: No stridor.   Cardiovascular: Normal rate, regular rhythm. Grossly normal heart sounds.  Good peripheral circulation. Respiratory: Normal respiratory effort.  No retractions. Lungs CTAB. Gastrointestinal: Soft and nontender. No distention. Musculoskeletal: Right hand no gross deformity. Range of motion is restricted secondary to pain. There is moderate tenderness on palpation of the base of the thumb and into the metacarpal region. Patient is unable to make a fist without pain. No lower extremity tenderness nor edema.  No joint effusions. Neurologic:  Normal speech and language. No gross focal neurologic deficits are appreciated. No gait instability. Skin:  Skin is warm, dry and intact. No rash noted. No erythema or warmth is noted. Psychiatric: Mood and affect are normal. Speech and behavior are normal.  ____________________________________________   LABS (all labs ordered are listed, but only abnormal results are displayed)  Labs Reviewed - No data to display  RADIOLOGY  X-ray of the right hand per radiologist shows moderate osteoarthritis. I, Johnn Hai, personally viewed and evaluated these images as part of my medical decision making.  ____________________________________________   PROCEDURES  Procedure(s) performed: None  Critical Care performed: No  ____________________________________________   INITIAL IMPRESSION / ASSESSMENT AND PLAN / ED COURSE  Pertinent labs & imaging results that were available during my care of the patient were reviewed by me and considered in my medical decision making (see chart for detailspatient was given prescription for Relafen 500 mg twice a day to be taken with food. She is to follow-up with Dr. Annamarie Major if any continued problems. Currently she is taking  Percocet 10/325 and gets 240 tablets every month.   FINAL CLINICAL IMPRESSION(S) / ED DIAGNOSES  Final diagnoses:  Primary osteoarthritis of right hand      Johnn Hai, PA-C 12/23/14 1347  Orbie Pyo, MD 12/23/14 2147

## 2014-12-23 NOTE — ED Notes (Addendum)
Pt reports right hand swelling since Friday, reports pain radiates up into arm. No obvious redness or swelling noted. Pt reports taking percocet at 0800 with no relief.

## 2014-12-23 NOTE — Discharge Instructions (Signed)
Osteoarthritis °Osteoarthritis is a disease that causes soreness and inflammation of a joint. It occurs when the cartilage at the affected joint wears down. Cartilage acts as a cushion, covering the ends of bones where they meet to form a joint. Osteoarthritis is the most common form of arthritis. It often occurs in older people. The joints affected most often by this condition include those in the: °· Ends of the fingers. °· Thumbs. °· Neck. °· Lower back. °· Knees. °· Hips. °CAUSES  °Over time, the cartilage that covers the ends of bones begins to wear away. This causes bone to rub on bone, producing pain and stiffness in the affected joints.  °RISK FACTORS °Certain factors can increase your chances of having osteoarthritis, including: °· Older age. °· Excessive body weight. °· Overuse of joints. °· Previous joint injury. °SIGNS AND SYMPTOMS  °· Pain, swelling, and stiffness in the joint. °· Over time, the joint may lose its normal shape. °· Small deposits of bone (osteophytes) may grow on the edges of the joint. °· Bits of bone or cartilage can break off and float inside the joint space. This may cause more pain and damage. °DIAGNOSIS  °Your health care provider will do a physical exam and ask about your symptoms. Various tests may be ordered, such as: °· X-rays of the affected joint. °· An MRI scan. °· Blood tests to rule out other types of arthritis. °· Joint fluid tests. This involves using a needle to draw fluid from the joint and examining the fluid under a microscope. °TREATMENT  °Goals of treatment are to control pain and improve joint function. Treatment plans may include: °· A prescribed exercise program that allows for rest and joint relief. °· A weight control plan. °· Pain relief techniques, such as: °¨ Properly applied heat and cold. °¨ Electric pulses delivered to nerve endings under the skin (transcutaneous electrical nerve stimulation [TENS]). °¨ Massage. °¨ Certain nutritional  supplements. °· Medicines to control pain, such as: °¨ Acetaminophen. °¨ Nonsteroidal anti-inflammatory drugs (NSAIDs), such as naproxen. °¨ Narcotic or central-acting agents, such as tramadol. °¨ Corticosteroids. These can be given orally or as an injection. °· Surgery to reposition the bones and relieve pain (osteotomy) or to remove loose pieces of bone and cartilage. Joint replacement may be needed in advanced states of osteoarthritis. °HOME CARE INSTRUCTIONS  °· Take medicines only as directed by your health care provider. °· Maintain a healthy weight. Follow your health care provider's instructions for weight control. This may include dietary instructions. °· Exercise as directed. Your health care provider can recommend specific types of exercise. These may include: °¨ Strengthening exercises. These are done to strengthen the muscles that support joints affected by arthritis. They can be performed with weights or with exercise bands to add resistance. °¨ Aerobic activities. These are exercises, such as brisk walking or low-impact aerobics, that get your heart pumping. °¨ Range-of-motion activities. These keep your joints limber. °¨ Balance and agility exercises. These help you maintain daily living skills. °· Rest your affected joints as directed by your health care provider. °· Keep all follow-up visits as directed by your health care provider. °SEEK MEDICAL CARE IF:  °· Your skin turns red. °· You develop a rash in addition to your joint pain. °· You have worsening joint pain. °· You have a fever along with joint or muscle aches. °SEEK IMMEDIATE MEDICAL CARE IF: °· You have a significant loss of weight or appetite. °· You have night sweats. °FOR MORE   [place] of Arthritis and Musculoskeletal and Skin Diseases: www.niams.SouthExposed.es  [employer] on Aging: http://kim-miller.com/  American College of Rheumatology: www.rheumatology.org Document Released: 05/04/2005 Document Revised:  09/18/2013 Document Reviewed: 01/09/2013 [facility] Patient Information 2015 [place], [place]. This information is not intended to replace advice given to you by your health care provider. Make sure you discuss any questions you have with your health care provider.   TAKE MEDICATION WITH FOOD

## 2015-01-17 ENCOUNTER — Other Ambulatory Visit: Payer: Self-pay

## 2015-01-17 MED ORDER — OXYCODONE-ACETAMINOPHEN 10-325 MG PO TABS: 1.0000 | ORAL_TABLET | Freq: Four times a day (QID) | ORAL | Status: DC | PRN

## 2015-01-17 NOTE — Telephone Encounter (Signed)
Pt left v/m requesting rex oxycodone apap. Call when ready for pick up. rx last printed # 240 on 12/21/14.last annual exam on 04/30/14.

## 2015-01-17 NOTE — Telephone Encounter (Signed)
She is a few days early This will not be refilled early again!

## 2015-01-18 ENCOUNTER — Encounter: Payer: Self-pay | Admitting: Internal Medicine

## 2015-01-18 NOTE — Telephone Encounter (Signed)
Spoke with patient and advised rx ready for pick-up and it will be at the front desk.  

## 2015-02-07 ENCOUNTER — Encounter (HOSPITAL_COMMUNITY): Payer: Self-pay | Admitting: *Deleted

## 2015-02-07 ENCOUNTER — Encounter: Payer: Self-pay | Admitting: Internal Medicine

## 2015-02-07 ENCOUNTER — Inpatient Hospital Stay (HOSPITAL_COMMUNITY)
Admission: EM | Admit: 2015-02-07 | Discharge: 2015-02-09 | DRG: 247 | Disposition: A | Discharge status: Home / Self Care | Payer: PPO | Attending: Cardiovascular Disease | Admitting: Cardiovascular Disease

## 2015-02-07 ENCOUNTER — Emergency Department (HOSPITAL_COMMUNITY): Payer: PPO

## 2015-02-07 DIAGNOSIS — E785 Hyperlipidemia, unspecified: Secondary | ICD-10-CM | POA: Diagnosis not present

## 2015-02-07 DIAGNOSIS — Z955 Presence of coronary angioplasty implant and graft: Secondary | ICD-10-CM

## 2015-02-07 DIAGNOSIS — F39 Unspecified mood [affective] disorder: Secondary | ICD-10-CM | POA: Diagnosis present

## 2015-02-07 DIAGNOSIS — Z7982 Long term (current) use of aspirin: Secondary | ICD-10-CM | POA: Diagnosis not present

## 2015-02-07 DIAGNOSIS — I25110 Atherosclerotic heart disease of native coronary artery with unstable angina pectoris: Secondary | ICD-10-CM | POA: Diagnosis not present

## 2015-02-07 DIAGNOSIS — E1165 Type 2 diabetes mellitus with hyperglycemia: Secondary | ICD-10-CM | POA: Diagnosis not present

## 2015-02-07 DIAGNOSIS — E1140 Type 2 diabetes mellitus with diabetic neuropathy, unspecified: Secondary | ICD-10-CM | POA: Diagnosis not present

## 2015-02-07 DIAGNOSIS — R079 Chest pain, unspecified: Secondary | ICD-10-CM | POA: Diagnosis present

## 2015-02-07 DIAGNOSIS — E669 Obesity, unspecified: Secondary | ICD-10-CM | POA: Diagnosis present

## 2015-02-07 DIAGNOSIS — Z79891 Long term (current) use of opiate analgesic: Secondary | ICD-10-CM

## 2015-02-07 DIAGNOSIS — Z794 Long term (current) use of insulin: Secondary | ICD-10-CM

## 2015-02-07 DIAGNOSIS — I10 Essential (primary) hypertension: Secondary | ICD-10-CM | POA: Diagnosis not present

## 2015-02-07 DIAGNOSIS — Z79899 Other long term (current) drug therapy: Secondary | ICD-10-CM | POA: Diagnosis not present

## 2015-02-07 DIAGNOSIS — E782 Mixed hyperlipidemia: Secondary | ICD-10-CM | POA: Diagnosis present

## 2015-02-07 DIAGNOSIS — I200 Unstable angina: Secondary | ICD-10-CM | POA: Diagnosis present

## 2015-02-07 DIAGNOSIS — E1159 Type 2 diabetes mellitus with other circulatory complications: Secondary | ICD-10-CM | POA: Diagnosis present

## 2015-02-07 DIAGNOSIS — I25119 Atherosclerotic heart disease of native coronary artery with unspecified angina pectoris: Secondary | ICD-10-CM | POA: Diagnosis present

## 2015-02-07 DIAGNOSIS — I2510 Atherosclerotic heart disease of native coronary artery without angina pectoris: Secondary | ICD-10-CM | POA: Diagnosis not present

## 2015-02-07 DIAGNOSIS — I252 Old myocardial infarction: Secondary | ICD-10-CM

## 2015-02-07 LAB — I-STAT TROPONIN, ED
Troponin i, poc: 0.01 ng/mL (ref 0.00–0.08)
Troponin i, poc: 0 ng/mL (ref 0.00–0.08)

## 2015-02-07 LAB — BASIC METABOLIC PANEL
Anion gap: 11 (ref 5–15)
BUN: 7 mg/dL (ref 6–20)
CO2: 23 mmol/L (ref 22–32)
Calcium: 9.2 mg/dL (ref 8.9–10.3)
Chloride: 100 mmol/L — ABNORMAL LOW (ref 101–111)
Creatinine, Ser: 0.61 mg/dL (ref 0.44–1.00)
GFR calc Af Amer: >60 mL/min (ref >60)
GFR calc non Af Amer: >60 mL/min (ref >60)
Glucose, Bld: 222 mg/dL — ABNORMAL HIGH (ref 65–99)
Potassium: 3.8 mmol/L (ref 3.5–5.1)
Sodium: 134 mmol/L — ABNORMAL LOW (ref 135–145)

## 2015-02-07 LAB — CBC
HCT: 42 % (ref 36.0–46.0)
Hemoglobin: 14.7 g/dL (ref 12.0–15.0)
MCH: 31 pg (ref 26.0–34.0)
MCHC: 35 g/dL (ref 30.0–36.0)
MCV: 88.6 fL (ref 78.0–100.0)
Platelets: 202 10*3/uL (ref 150–400)
RBC: 4.74 MIL/uL (ref 3.87–5.11)
RDW: 12.7 % (ref 11.5–15.5)
WBC: 8.5 10*3/uL (ref 4.0–10.5)

## 2015-02-07 LAB — TROPONIN I: Troponin I: <0.03 ng/mL (ref <0.031)

## 2015-02-07 LAB — GLUCOSE, CAPILLARY: Glucose-Capillary: 186 mg/dL — ABNORMAL HIGH (ref 65–99)

## 2015-02-07 MED ORDER — INSULIN NPH (HUMAN) (ISOPHANE) 100 UNIT/ML ~~LOC~~ SUSP
40.0000 [IU] | Freq: Every day | SUBCUTANEOUS | Status: DC
  Administered 2015-02-09: 40 [IU] via SUBCUTANEOUS
  Filled 2015-02-07 (×2): qty 10

## 2015-02-07 MED ORDER — CARVEDILOL 3.125 MG PO TABS
6.2500 mg | ORAL_TABLET | Freq: Two times a day (BID) | ORAL | Status: DC
  Administered 2015-02-08 (×2): 6.25 mg via ORAL
  Filled 2015-02-07: qty 1
  Filled 2015-02-07 (×2): qty 2

## 2015-02-07 MED ORDER — INSULIN ASPART 100 UNIT/ML ~~LOC~~ SOLN
0.0000 [IU] | Freq: Three times a day (TID) | SUBCUTANEOUS | Status: DC
  Administered 2015-02-08 (×2): 3 [IU] via SUBCUTANEOUS
  Administered 2015-02-08 – 2015-02-09 (×2): 5 [IU] via SUBCUTANEOUS

## 2015-02-07 MED ORDER — SODIUM CHLORIDE 0.9 % WEIGHT BASED INFUSION
3.0000 mL/kg/h | INTRAVENOUS | Status: DC
  Administered 2015-02-08: 3 mL/kg/h via INTRAVENOUS

## 2015-02-07 MED ORDER — SODIUM CHLORIDE 0.9 % IV SOLN: 250.0000 mL | INTRAVENOUS | Status: DC | PRN

## 2015-02-07 MED ORDER — SODIUM CHLORIDE 0.9 % WEIGHT BASED INFUSION: 1.0000 mL/kg/h | INTRAVENOUS | Status: DC

## 2015-02-07 MED ORDER — ATORVASTATIN CALCIUM 40 MG PO TABS
40.0000 mg | ORAL_TABLET | Freq: Every day | ORAL | Status: DC
  Administered 2015-02-07: 40 mg via ORAL
  Filled 2015-02-07: qty 1

## 2015-02-07 MED ORDER — NITROGLYCERIN 0.4 MG SL SUBL: 0.4000 mg | SUBLINGUAL_TABLET | SUBLINGUAL | Status: DC | PRN

## 2015-02-07 MED ORDER — SODIUM CHLORIDE 0.9 % IJ SOLN
3.0000 mL | Freq: Two times a day (BID) | INTRAMUSCULAR | Status: DC
  Administered 2015-02-07: 3 mL via INTRAVENOUS

## 2015-02-07 MED ORDER — OXYCODONE-ACETAMINOPHEN 10-325 MG PO TABS: 1.0000 | ORAL_TABLET | Freq: Four times a day (QID) | ORAL | Status: DC | PRN

## 2015-02-07 MED ORDER — LOSARTAN POTASSIUM 50 MG PO TABS
100.0000 mg | ORAL_TABLET | Freq: Every day | ORAL | Status: DC
  Administered 2015-02-08 – 2015-02-09 (×2): 100 mg via ORAL
  Filled 2015-02-07 (×2): qty 2

## 2015-02-07 MED ORDER — AMITRIPTYLINE HCL 25 MG PO TABS
150.0000 mg | ORAL_TABLET | Freq: Every day | ORAL | Status: DC
  Administered 2015-02-07 – 2015-02-08 (×2): 150 mg via ORAL
  Filled 2015-02-07 (×4): qty 6

## 2015-02-07 MED ORDER — ASPIRIN 81 MG PO CHEW
81.0000 mg | CHEWABLE_TABLET | ORAL | Status: AC
  Administered 2015-02-08: 81 mg via ORAL
  Filled 2015-02-07: qty 1

## 2015-02-07 MED ORDER — OXYCODONE HCL 5 MG PO TABS
5.0000 mg | ORAL_TABLET | Freq: Four times a day (QID) | ORAL | Status: DC | PRN
  Administered 2015-02-07 – 2015-02-08 (×3): 5 mg via ORAL
  Administered 2015-02-08: 10 mg via ORAL
  Administered 2015-02-09 (×2): 5 mg via ORAL
  Filled 2015-02-07: qty 2
  Filled 2015-02-07 (×5): qty 1

## 2015-02-07 MED ORDER — INFLUENZA VAC SPLIT QUAD 0.5 ML IM SUSY
0.5000 mL | PREFILLED_SYRINGE | INTRAMUSCULAR | Status: AC
  Administered 2015-02-09: 0.5 mL via INTRAMUSCULAR
  Filled 2015-02-07 (×2): qty 0.5

## 2015-02-07 MED ORDER — ACETAMINOPHEN 325 MG PO TABS: 650.0000 mg | ORAL_TABLET | ORAL | Status: DC | PRN

## 2015-02-07 MED ORDER — INSULIN ASPART 100 UNIT/ML ~~LOC~~ SOLN
3.0000 [IU] | Freq: Three times a day (TID) | SUBCUTANEOUS | Status: DC
  Administered 2015-02-08 – 2015-02-09 (×3): 3 [IU] via SUBCUTANEOUS

## 2015-02-07 MED ORDER — OXYCODONE-ACETAMINOPHEN 5-325 MG PO TABS
2.0000 | ORAL_TABLET | Freq: Once | ORAL | Status: AC
  Administered 2015-02-07: 2 via ORAL
  Filled 2015-02-07: qty 2

## 2015-02-07 MED ORDER — ONDANSETRON HCL 4 MG/2ML IJ SOLN: 4.0000 mg | Freq: Four times a day (QID) | INTRAMUSCULAR | Status: DC | PRN

## 2015-02-07 MED ORDER — HEPARIN SODIUM (PORCINE) 5000 UNIT/ML IJ SOLN
5000.0000 [IU] | Freq: Three times a day (TID) | INTRAMUSCULAR | Status: DC
  Administered 2015-02-07 – 2015-02-09 (×3): 5000 [IU] via SUBCUTANEOUS
  Filled 2015-02-07 (×3): qty 1

## 2015-02-07 MED ORDER — OXYCODONE-ACETAMINOPHEN 5-325 MG PO TABS
1.0000 | ORAL_TABLET | Freq: Four times a day (QID) | ORAL | Status: DC | PRN
  Administered 2015-02-07 – 2015-02-09 (×5): 1 via ORAL
  Filled 2015-02-07 (×5): qty 1

## 2015-02-07 MED ORDER — ASPIRIN EC 81 MG PO TBEC
81.0000 mg | DELAYED_RELEASE_TABLET | Freq: Every day | ORAL | Status: DC
  Administered 2015-02-08 – 2015-02-09 (×2): 81 mg via ORAL
  Filled 2015-02-07 (×2): qty 1

## 2015-02-07 MED ORDER — SODIUM CHLORIDE 0.9 % IJ SOLN: 3.0000 mL | INTRAMUSCULAR | Status: DC | PRN

## 2015-02-07 NOTE — H&P (Signed)
Chief Complaint:  Chest pain  Cardiologist: Hochrein/Cooper  HPI:  This is a 74 y.o. female with a past medical history significant for CAD s/p RCA stent 2014, known moderate 70% LAD-diagonal artery stenosis, without Cardiology follow up since stent presents with retrosternal chest pain taht comes and goes with increasing frequency and woke her from sleep earlier today. Promptly relieved by NTG. Also has left shoulder pain that is sometimes severe, but is positional and unaffected by NTG  ECG shows Q waves in the inferior leads and across the anterior precordium, not changed from 2014, no ischemic repolarization abnormalities present. Interestingly, in 2014 had normal echo wall motion despite these ECG changes.  She has poorly controlled insulin requiring diabetes and treated hyperlipidemia (LDL OK, but unfavorable HDL and TG), HTN, not a smoker.  PMHx:  Past Medical History  Diagnosis Date  . Osteoarthritis of spine     knees also  . Hyperlipemia   . Hypertension   . Diabetes mellitus, type 2   . Episodic mood disorder   . CAD (coronary artery disease)     a. 9.2001 Cath: LM nl, LAD 30p, LCX nl, OM1 30-40p, RCA dominant diffuse irregs, EF 60-65%;  b. 05/2010 Lexi MV EF 58%, no isch/infarct;  c. 10/2012 Cath/PCI: LM 25 ost, LAD 64m D1 60ost, LCX min irregs, RCA dominant, 50p, 95/435m2.75x32 Promus Premier DES), 40d PD/PL's nl, EF 65%.  . Obesity     Past Surgical History  Procedure Laterality Date  . Gallbladder surgery  1968  . Appendectomy  1959  . Combined hysterectomy abdominal w/ a&p repair / oophorectomy  1968  . Multiple d&c    . Tonsillectomy  age [age]. Coronary stent placement  2014  . Left heart catheterization with coronary angiogram N/A 10/20/2012    Procedure: LEFT HEART CATHETERIZATION WITH CORONARY ANGIOGRAM;  Surgeon: MiSherren MochaMD;  [facility];  Service: Cardiovascular;  Laterality: N/A;  . Cholecystectomy      FAMHx:  Family History    Problem Relation Age of Onset  . Cancer Brother     Colon  . Coronary artery disease Neg Hx   . Heart attack Neg Hx     SOCHx:   reports that she has never smoked. She has never used smokeless tobacco. She reports that she does not drink alcohol or use illicit drugs.  ALLERGIES:  No Known Allergies  ROS: The patient specifically denies dyspnea at rest or with exertion, orthopnea, paroxysmal nocturnal dyspnea, syncope, palpitations, focal neurological deficits, intermittent claudication, lower extremity edema, unexplained weight gain, cough, hemoptysis or wheezing.  The patient also denies abdominal pain, nausea, vomiting, dysphagia, diarrhea, constipation, polyuria, polydipsia, dysuria, hematuria, frequency, urgency, abnormal bleeding or bruising, fever, chills, unexpected weight changes, mood swings, change in skin or hair texture, change in voice quality, auditory problems, allergic reactions or rashes, new musculoskeletal complaints other than usual "aches and pains".  "Her vision is going", has not seen an ophthalmologist  A comprehensive review of systems was otherwise negative.  HOME MEDS: No current facility-administered medications on file prior to encounter.   Current Outpatient Prescriptions on File Prior to Encounter  Medication Sig Dispense Refill  . amitriptyline (ELAVIL) 150 MG tablet Take 1 tablet (150 mg total) by mouth at bedtime. 90 tablet 3  . aspirin EC 81 MG EC tablet Take 1 tablet (81 mg total) by mouth daily.    . Marland Kitchentorvastatin (LIPITOR) 40 MG tablet Take 1 tablet (40 mg total) by  mouth daily. 30 tablet 11  . carvedilol (COREG) 6.25 MG tablet Take 1 tablet (6.25 mg total) by mouth 2 (two) times daily with a meal. 60 tablet 11  . furosemide (LASIX) 40 MG tablet Take 1 tablet (40 mg total) by mouth daily. 30 tablet 3  . insulin NPH Human (NOVOLIN N RELION) 100 UNIT/ML injection Inject 40 units a day as advised (Patient taking differently: Inject 40 Units into the  skin daily before breakfast. Inject 40 units a day as advised) 20 mL 2  . losartan (COZAAR) 100 MG tablet TAKE 1 TABLET BY MOUTH DAILY 90 tablet 1  . metFORMIN (GLUCOPHAGE) 1000 MG tablet Take 1 tablet (1,000 mg total) by mouth 2 (two) times daily with a meal. 60 tablet 5  . nabumetone (RELAFEN) 500 MG tablet Take 1 tablet (500 mg total) by mouth daily. 20 tablet 0  . nitroGLYCERIN (NITROSTAT) 0.4 MG SL tablet Place 1 tablet (0.4 mg total) under the tongue every 5 (five) minutes as needed for chest pain. 30 tablet 12  . oxyCODONE-acetaminophen (PERCOCET) 10-325 MG per tablet Take 1-2 tablets by mouth 4 (four) times daily as needed for pain. 240 tablet 0  . B-D UF III MINI PEN NEEDLES 31G X 5 MM MISC USE AS DIRECTED WITH LANTUS SOLOSTAR 100 each 11  . insulin regular (NOVOLIN R RELION) 100 units/mL injection Inject 0.08-0.1 mLs (8-10 Units total) into the skin 3 (three) times daily before meals. (Patient not taking: Reported on 02/07/2015) 10 mL 2  . Insulin Syringe-Needle U-100 (RELION INSULIN SYR 0.5ML/31G) 31G X 5/16" 0.5 ML MISC Use 3x a day 200 each 6  . zoster vaccine live, PF, (ZOSTAVAX) 05397 UNT/0.65ML injection Inject 19,400 Units into the skin once. 1 each 0    LABS/IMAGING: Results for orders placed or performed during the hospital encounter of 02/07/15 (from the past 48 hour(s))  Basic metabolic panel     Status: Abnormal   Collection Time: 02/07/15  2:30 PM  Result Value Ref Range   Sodium 134 (L) 135 - 145 mmol/L   Potassium 3.8 3.5 - 5.1 mmol/L   Chloride 100 (L) 101 - 111 mmol/L   CO2 23 22 - 32 mmol/L   Glucose, Bld 222 (H) 65 - 99 mg/dL   BUN 7 6 - 20 mg/dL   Creatinine, Ser 0.61 0.44 - 1.00 mg/dL   Calcium 9.2 8.9 - 10.3 mg/dL   GFR calc non Af Amer >60 >60 mL/min   GFR calc Af Amer >60 >60 mL/min    Comment: (NOTE) The eGFR has been calculated using the CKD EPI equation. This calculation has not been validated in all clinical situations. eGFR's persistently <60 mL/min  signify possible Chronic Kidney Disease.    Anion gap 11 5 - 15  CBC     Status: None   Collection Time: 02/07/15  2:30 PM  Result Value Ref Range   WBC 8.5 4.0 - 10.5 K/uL   RBC 4.74 3.87 - 5.11 MIL/uL   Hemoglobin 14.7 12.0 - 15.0 g/dL   HCT 42.0 36.0 - 46.0 %   MCV 88.6 78.0 - 100.0 fL   MCH 31.0 26.0 - 34.0 pg   MCHC 35.0 30.0 - 36.0 g/dL   RDW 12.7 11.5 - 15.5 %   Platelets 202 150 - 400 K/uL  I-stat troponin, ED     Status: None   Collection Time: 02/07/15  2:57 PM  Result Value Ref Range   Troponin i, poc 0.01 0.00 -  0.08 ng/mL   Comment 3            Comment: Due to the release kinetics of cTnI, a negative result within the first hours of the onset of symptoms does not rule out myocardial infarction with certainty. If myocardial infarction is still suspected, repeat the test at appropriate intervals.    Dg Chest 2 View  02/07/2015   CLINICAL DATA:  Acute left-sided chest pain radiating into left arm today.  EXAM: CHEST  2 VIEW  COMPARISON:  10/19/2012 and prior chest radiographs  FINDINGS: Upper limits normal heart size again noted.  A coronary artery stent is identified.  There is no evidence of focal airspace disease, pulmonary edema, suspicious pulmonary nodule/mass, pleural effusion, or pneumothorax. No acute bony abnormalities are identified.  IMPRESSION: No evidence of acute cardiopulmonary disease.   Electronically Signed   By: Margarette Canada M.D.   On: 02/07/2015 15:02    VITALS: Blood pressure 171/98, pulse 83, temperature 98.5 F (36.9 C), temperature source Oral, resp. rate 14, height 5' 8.5" (1.74 m), weight 192 lb (87.091 kg), SpO2 99 %.  EXAM:  General: Alert, oriented x3, no distress Head: no evidence of trauma, PERRL, EOMI, no exophtalmos or lid lag, no myxedema, no xanthelasma; normal ears, nose and oropharynx Neck: normal jugular venous pulsations and no hepatojugular reflux; brisk carotid pulses without delay and no carotid bruits Chest: clear to  auscultation, no signs of consolidation by percussion or palpation, normal fremitus, symmetrical and full respiratory excursions Cardiovascular: normal position and quality of the apical impulse, regular rhythm, normal first heart sound and normal second heart sounds, no rubs or gallops, no murmur Abdomen: no tenderness or distention, no masses by palpation, no abnormal pulsatility or arterial bruits, normal bowel sounds, no hepatosplenomegaly Extremities: no clubbing, cyanosis or edema; 2+ radial, ulnar and brachial pulses bilaterally; 2+ right femoral, posterior tibial and dorsalis pedis pulses; 2+ left femoral, posterior tibial and dorsalis pedis pulses; no subclavian or femoral bruits Neurological: grossly nonfocal   IMPRESSION/PLAN: Unstable angina pectoris in a patient with known multivessel CAD and poorly controlled DM. Discussed options for further evaluation and recommended proceeding directly to coronary angio and revascularization if appropriate. This procedure has been fully reviewed with the patient and written informed consent has been obtained. Cycle enzymes. Recheck lipids and A1c.   Sanda Klein, MD, [facility] [phone] office [phone] pager  02/07/2015, 6:07 PM

## 2015-02-07 NOTE — ED Provider Notes (Signed)
CSN: [id number]     Arrival date & time 02/07/15  1407 History   First MD Initiated Contact with Patient 02/07/15 1418     Chief Complaint  Patient presents with  . Chest Pain     (Consider location/radiation/quality/duration/timing/severity/associated sxs/prior Treatment) HPI Comments: Pt is a 74 yo female with PMH of HTN, arthritis, DM, CAD and coronary stent (2014) who presents to the ED via EMS with complaint of CP, onset 2hrs PTA. Pt reports she was sitting at home when she started to have sudden onset "aching" pain in her left shoulder that started to radiate down her left arm and into her left chest. Denies fever, headache, SOB, palpitations, abdominal pain, N/V/D, numbness, tingling, weakness. She notes that yesterday she has generalized abdominal pain with N/V x1 that has since resolved. Pt reports having a MI with stent placement in 2014 but denies following up with cardio since. Pt reports taking percocet for pain PTA without relief. EMS gave pt 2 ASA and 1 SL nitro en route, pt reports relief of CP s/p nitro.   Patient is a 74 y.o. female presenting with chest pain.  Chest Pain   Past Medical History  Diagnosis Date  . Osteoarthritis of spine     knees also  . Hyperlipemia   . Hypertension   . Diabetes mellitus, type 2   . Episodic mood disorder   . CAD (coronary artery disease)     a. 9.2001 Cath: LM nl, LAD 30p, LCX nl, OM1 30-40p, RCA dominant diffuse irregs, EF 60-65%;  b. 05/2010 Lexi MV EF 58%, no isch/infarct;  c. 10/2012 Cath/PCI: LM 25 ost, LAD 22m, D1 60ost, LCX min irregs, RCA dominant, 50p, 95/3m (2.75x32 Promus Premier DES), 40d PD/PL's nl, EF 65%.  . Obesity    Past Surgical History  Procedure Laterality Date  . Gallbladder surgery  1968  . Appendectomy  1959  . Combined hysterectomy abdominal w/ a&p repair / oophorectomy  1968  . Multiple d&c    . Tonsillectomy  age 62  . Coronary stent placement  2014  . Left heart catheterization with coronary angiogram  N/A 10/20/2012    Procedure: LEFT HEART CATHETERIZATION WITH CORONARY ANGIOGRAM;  Surgeon: Sherren Mocha, MD;  Location: [facility] CATH LAB;  Service: Cardiovascular;  Laterality: N/A;  . Cholecystectomy     Family History  Problem Relation Age of Onset  . Cancer Brother     Colon  . Coronary artery disease Neg Hx   . Heart attack Neg Hx    Social History  Substance Use Topics  . Smoking status: Never Smoker   . Smokeless tobacco: Never Used  . Alcohol Use: No   OB History    No data available     Review of Systems  Cardiovascular: Positive for chest pain.  Musculoskeletal:       Left arm and shoulder pain  All other systems reviewed and are negative.     Allergies  Review of patient's allergies indicates no known allergies.  Home Medications   Prior to Admission medications   Medication Sig Start Date End Date Taking? Authorizing Provider  amitriptyline (ELAVIL) 150 MG tablet Take 1 tablet (150 mg total) by mouth at bedtime. 07/09/14   Venia Carbon, MD  aspirin EC 81 MG EC tablet Take 1 tablet (81 mg total) by mouth daily. 10/21/12   Neena Rhymes, MD  atorvastatin (LIPITOR) 40 MG tablet Take 1 tablet (40 mg total) by mouth daily. 10/16/13  Venia Carbon, MD  B-D UF III MINI PEN NEEDLES 31G X 5 MM MISC USE AS DIRECTED WITH LANTUS SOLOSTAR 06/10/13   Neena Rhymes, MD  carvedilol (COREG) 6.25 MG tablet Take 1 tablet (6.25 mg total) by mouth 2 (two) times daily with a meal. 08/02/13   Neena Rhymes, MD  Cholecalciferol (VITAMIN D3) 1000 UNITS CAPS Take by mouth daily.      Historical Provider, MD  furosemide (LASIX) 40 MG tablet Take 1 tablet (40 mg total) by mouth daily. 08/02/13   Neena Rhymes, MD  Garlic Oil 016 MG CAPS Take by mouth daily.      Historical Provider, MD  insulin NPH Human (NOVOLIN N RELION) 100 UNIT/ML injection Inject 40 units a day as advised 03/20/14   Philemon Kingdom, MD  insulin regular (NOVOLIN R RELION) 100 units/mL injection Inject 0.08-0.1  mLs (8-10 Units total) into the skin 3 (three) times daily before meals. 03/20/14   Philemon Kingdom, MD  Insulin Syringe-Needle U-100 (RELION INSULIN SYR 0.5ML/31G) 31G X 5/16" 0.5 ML MISC Use 3x a day 03/20/14   Philemon Kingdom, MD  Iron-Vit C-Vit B12-Folic Acid (IRON 010 PLUS PO) Take by mouth.    Historical Provider, MD  losartan (COZAAR) 100 MG tablet TAKE 1 TABLET BY MOUTH DAILY 12/17/14   Venia Carbon, MD  metFORMIN (GLUCOPHAGE) 1000 MG tablet Take 1 tablet (1,000 mg total) by mouth 2 (two) times daily with a meal. 08/22/14   Venia Carbon, MD  nabumetone (RELAFEN) 500 MG tablet Take 1 tablet (500 mg total) by mouth daily. 12/23/14   Johnn Hai, PA-C  nitroGLYCERIN (NITROSTAT) 0.4 MG SL tablet Place 1 tablet (0.4 mg total) under the tongue every 5 (five) minutes as needed for chest pain. 11/26/12   Lendon Colonel, NP  oxyCODONE-acetaminophen (PERCOCET) 10-325 MG per tablet Take 1-2 tablets by mouth 4 (four) times daily as needed for pain. 01/17/15   Venia Carbon, MD  zoster vaccine live, PF, (ZOSTAVAX) 93235 UNT/0.65ML injection Inject 19,400 Units into the skin once. 04/30/14   Venia Carbon, MD   BP 178/103 mmHg  Pulse 85  Temp(Src) 98.5 F (36.9 C) (Oral)  Resp 22  Ht 5' 8.5" (1.74 m)  Wt 192 lb (87.091 kg)  BMI 28.77 kg/m2  SpO2 99% Physical Exam  Constitutional: She is oriented to person, place, and time. She appears well-developed and well-nourished. No distress.  HENT:  Head: Normocephalic and atraumatic.  Mouth/Throat: Oropharynx is clear and moist. No oropharyngeal exudate.  Eyes: Conjunctivae and EOM are normal. Pupils are equal, round, and reactive to light. Right eye exhibits no discharge. Left eye exhibits no discharge. No scleral icterus.  Neck: Normal range of motion. Neck supple.  Cardiovascular: Normal rate, regular rhythm, normal heart sounds and intact distal pulses.   Pulmonary/Chest: Effort normal and breath sounds normal. No respiratory distress.  She has no wheezes. She has no rales. She exhibits tenderness (TTP across anterior chest wall).  Abdominal: Soft. Bowel sounds are normal. She exhibits no mass. There is no tenderness. There is no rebound and no guarding.  Musculoskeletal: Normal range of motion. She exhibits no edema or tenderness.  No contusions, abrasions or swelling noted to left shoulder/arm. FROM of left upper extremity. 5/5 strength. Sensation intact. 2+ radial pulses.   Lymphadenopathy:    She has no cervical adenopathy.  Neurological: She is alert and oriented to person, place, and time.  Skin: Skin is warm and dry.  She is not diaphoretic.  Nursing note and vitals reviewed.   ED Course  Procedures (including critical care time) Labs Review Labs Reviewed  BASIC METABOLIC PANEL  CBC    Imaging Review No results found. I have personally reviewed and evaluated these images and lab results as part of my medical decision-making.   EKG Interpretation   Date/Time:  Thursday February 07 2015 14:12:46 EDT Ventricular Rate:  83 PR Interval:  170 QRS Duration: 103 QT Interval:  362 QTC Calculation: 425 R Axis:   -20 Text Interpretation:  Sinus rhythm Ventricular premature complex Abnormal  R-wave progression, early transition LVH with secondary repolarization  abnormality Inferior infarct, old No significant change since last tracing  Confirmed by LIU MD, DANA [phone]) on 02/07/2015 2:29:38 PM      MDM   Final diagnoses:  Chest pain, unspecified chest pain type    Pt presents with sudden onset left shoulder pain that occurred at while sitting at home. Reports radiation down left arm and across to left chest. CP improved with SL nitro given by EMS. History of HTN, CAD, MI with stent placement in 2014. Pt reports she has not follow up with cardiology since stent placement. No improvement of pain at home with percocet. VSS. TTP across anterior chest wall. Left arm neurovascularly intact.   EKG unchanged.  Troponin 0.01. Labs unremarkable. CXR negative. Moderate HEART Score. Cardiology consulted, they will come see pt in the ED. Consulted hospitalist who agree to admit for observation, orders placed. Discussed plan with pt.   Chesley Noon [place], [place] 02/07/15 [place] Liu, MD 02/07/15 2012

## 2015-02-07 NOTE — ED Notes (Signed)
Pt states L arm pain that began radiating to L side of chest while laying in bed this am.  C/o emesis yesterday, but denies nv or diaphoresis.  EKG unremarkable per EMS.  Pt took 2 325 mg asa and was given 1 SL nitro with some relief.  Initial bp was 200/86 and dropped to 140/100 with nitro.  CBG 213.

## 2015-02-08 ENCOUNTER — Encounter (HOSPITAL_COMMUNITY)
Admission: EM | Disposition: A | Discharge status: Home / Self Care | Payer: Self-pay | Source: Home / Self Care | Attending: Cardiovascular Disease

## 2015-02-08 ENCOUNTER — Encounter (HOSPITAL_COMMUNITY): Payer: Self-pay | Admitting: Cardiovascular Disease

## 2015-02-08 HISTORY — PX: CARDIAC CATHETERIZATION: SHX172

## 2015-02-08 LAB — PROTIME-INR
INR: 1.16 (ref 0.00–1.49)
Prothrombin Time: 15 seconds (ref 11.6–15.2)

## 2015-02-08 LAB — TROPONIN I
Troponin I: <0.03 ng/mL (ref <0.031)
Troponin I: <0.03 ng/mL (ref <0.031)

## 2015-02-08 LAB — LIPID PANEL
Cholesterol: 237 mg/dL — ABNORMAL HIGH (ref 0–200)
HDL: 30 mg/dL — ABNORMAL LOW (ref >40)
LDL Cholesterol: UNDETERMINED mg/dL (ref 0–99)
Total CHOL/HDL Ratio: 7.9 RATIO
Triglycerides: 697 mg/dL — ABNORMAL HIGH (ref <150)
VLDL: UNDETERMINED mg/dL (ref 0–40)

## 2015-02-08 LAB — BASIC METABOLIC PANEL
Anion gap: 7 (ref 5–15)
BUN: 8 mg/dL (ref 6–20)
CO2: 26 mmol/L (ref 22–32)
Calcium: 8.6 mg/dL — ABNORMAL LOW (ref 8.9–10.3)
Chloride: 101 mmol/L (ref 101–111)
Creatinine, Ser: 0.7 mg/dL (ref 0.44–1.00)
GFR calc Af Amer: >60 mL/min (ref >60)
GFR calc non Af Amer: >60 mL/min (ref >60)
Glucose, Bld: 237 mg/dL — ABNORMAL HIGH (ref 65–99)
Potassium: 3.9 mmol/L (ref 3.5–5.1)
Sodium: 134 mmol/L — ABNORMAL LOW (ref 135–145)

## 2015-02-08 LAB — POCT ACTIVATED CLOTTING TIME: Activated Clotting Time: 521 seconds

## 2015-02-08 LAB — GLUCOSE, CAPILLARY
Glucose-Capillary: 223 mg/dL — ABNORMAL HIGH (ref 65–99)
Glucose-Capillary: 171 mg/dL — ABNORMAL HIGH (ref 65–99)
Glucose-Capillary: 164 mg/dL — ABNORMAL HIGH (ref 65–99)
Glucose-Capillary: 169 mg/dL — ABNORMAL HIGH (ref 65–99)

## 2015-02-08 SURGERY — LEFT HEART CATH AND CORONARY ANGIOGRAPHY
Anesthesia: LOCAL

## 2015-02-08 MED ORDER — SODIUM CHLORIDE 0.9 % IV SOLN: INTRAVENOUS | Status: AC

## 2015-02-08 MED ORDER — TICAGRELOR 90 MG PO TABS
ORAL_TABLET | ORAL | Status: AC
  Filled 2015-02-08: qty 1

## 2015-02-08 MED ORDER — VERAPAMIL HCL 2.5 MG/ML IV SOLN
INTRAVENOUS | Status: AC
  Filled 2015-02-08: qty 2

## 2015-02-08 MED ORDER — SODIUM CHLORIDE 0.9 % IV SOLN
250.0000 mg | INTRAVENOUS | Status: DC | PRN
  Administered 2015-02-08: 1.75 mg/kg/h via INTRAVENOUS

## 2015-02-08 MED ORDER — ANGIOPLASTY BOOK
Freq: Once | Status: AC
  Administered 2015-02-08: 21:00:00
  Filled 2015-02-08: qty 1

## 2015-02-08 MED ORDER — FENTANYL CITRATE (PF) 100 MCG/2ML IJ SOLN
INTRAMUSCULAR | Status: DC | PRN
  Administered 2015-02-08 (×5): 25 ug via INTRAVENOUS

## 2015-02-08 MED ORDER — ATORVASTATIN CALCIUM 80 MG PO TABS
80.0000 mg | ORAL_TABLET | Freq: Every day | ORAL | Status: DC
  Administered 2015-02-08: 80 mg via ORAL
  Filled 2015-02-08: qty 1

## 2015-02-08 MED ORDER — SODIUM CHLORIDE 0.9 % IV SOLN
INTRAVENOUS | Status: DC
  Administered 2015-02-08: 10:00:00 via INTRAVENOUS

## 2015-02-08 MED ORDER — HEPARIN SODIUM (PORCINE) 1000 UNIT/ML IJ SOLN
INTRAMUSCULAR | Status: AC
  Filled 2015-02-08: qty 1

## 2015-02-08 MED ORDER — SODIUM CHLORIDE 0.9 % IV SOLN: 250.0000 mL | INTRAVENOUS | Status: DC | PRN

## 2015-02-08 MED ORDER — TICAGRELOR 90 MG PO TABS
ORAL_TABLET | ORAL | Status: DC | PRN
  Administered 2015-02-08: 180 mg via ORAL

## 2015-02-08 MED ORDER — SODIUM CHLORIDE 0.9 % IJ SOLN: 3.0000 mL | Freq: Two times a day (BID) | INTRAMUSCULAR | Status: DC

## 2015-02-08 MED ORDER — TICAGRELOR 90 MG PO TABS
90.0000 mg | ORAL_TABLET | Freq: Two times a day (BID) | ORAL | Status: DC
  Administered 2015-02-08 – 2015-02-09 (×2): 90 mg via ORAL
  Filled 2015-02-08 (×2): qty 1

## 2015-02-08 MED ORDER — BIVALIRUDIN BOLUS VIA INFUSION - CUPID
INTRAVENOUS | Status: DC | PRN
  Administered 2015-02-08: 64.575 mg via INTRAVENOUS

## 2015-02-08 MED ORDER — ADENOSINE 12 MG/4ML IV SOLN
16.0000 mL | Freq: Once | INTRAVENOUS | Status: AC
  Administered 2015-02-08: 48 mg via INTRAVENOUS
  Filled 2015-02-08: qty 16

## 2015-02-08 MED ORDER — BIVALIRUDIN 250 MG IV SOLR
INTRAVENOUS | Status: AC
  Filled 2015-02-08: qty 250

## 2015-02-08 MED ORDER — NITROGLYCERIN 1 MG/10 ML FOR IR/CATH LAB
INTRA_ARTERIAL | Status: AC
  Filled 2015-02-08: qty 10

## 2015-02-08 MED ORDER — SODIUM CHLORIDE 0.9 % IJ SOLN: 3.0000 mL | INTRAMUSCULAR | Status: DC | PRN

## 2015-02-08 MED ORDER — ADENOSINE (DIAGNOSTIC) 140MCG/KG/MIN: INTRAVENOUS | Status: DC | PRN

## 2015-02-08 MED ORDER — HEPARIN (PORCINE) IN NACL 2-0.9 UNIT/ML-% IJ SOLN
INTRAMUSCULAR | Status: AC
  Filled 2015-02-08: qty 1000

## 2015-02-08 MED ORDER — LIDOCAINE HCL (PF) 1 % IJ SOLN
INTRAMUSCULAR | Status: AC
  Filled 2015-02-08: qty 30

## 2015-02-08 MED ORDER — MIDAZOLAM HCL 2 MG/2ML IJ SOLN
INTRAMUSCULAR | Status: AC
  Filled 2015-02-08: qty 4

## 2015-02-08 MED ORDER — HEPARIN SODIUM (PORCINE) 1000 UNIT/ML IJ SOLN
INTRAMUSCULAR | Status: DC | PRN
  Administered 2015-02-08: 4000 [IU] via INTRAVENOUS

## 2015-02-08 MED ORDER — IOHEXOL 350 MG/ML SOLN
INTRAVENOUS | Status: DC | PRN
  Administered 2015-02-08: 235 mL via INTRAVENOUS

## 2015-02-08 MED ORDER — MIDAZOLAM HCL 2 MG/2ML IJ SOLN
INTRAMUSCULAR | Status: DC | PRN
  Administered 2015-02-08 (×4): 1 mg via INTRAVENOUS

## 2015-02-08 MED ORDER — NITROGLYCERIN 1 MG/10 ML FOR IR/CATH LAB
INTRA_ARTERIAL | Status: DC | PRN
  Administered 2015-02-08: 13:00:00

## 2015-02-08 MED ORDER — FENTANYL CITRATE (PF) 100 MCG/2ML IJ SOLN
INTRAMUSCULAR | Status: AC
  Filled 2015-02-08: qty 4

## 2015-02-08 MED ORDER — VERAPAMIL HCL 2.5 MG/ML IV SOLN
INTRAVENOUS | Status: DC | PRN
  Administered 2015-02-08: 11:00:00 via INTRA_ARTERIAL

## 2015-02-08 SURGICAL SUPPLY — 21 items
BALLN EMERGE MR 2.0X12 (BALLOONS) ×2
BALLN EMERGE MR 2.25X15 (BALLOONS) ×2
BALLN ~~LOC~~ EMERGE MR 3.25X12 (BALLOONS) ×2
BALLOON EMERGE MR 2.0X12 (BALLOONS) ×1 IMPLANT
BALLOON EMERGE MR 2.25X15 (BALLOONS) ×1 IMPLANT
BALLOON ~~LOC~~ EMERGE MR 3.25X12 (BALLOONS) ×1 IMPLANT
CATH INFINITI 5FR MULTPACK ANG (CATHETERS) ×2 IMPLANT
CATH VISTA GUIDE 6FR XBLAD3.5 (CATHETERS) ×2 IMPLANT
DEVICE RAD COMP TR BAND LRG (VASCULAR PRODUCTS) ×2 IMPLANT
GLIDESHEATH SLEND SS 6F .021 (SHEATH) ×2 IMPLANT
GUIDEWIRE PRESSURE COMET II (WIRE) ×2 IMPLANT
KIT ENCORE 26 ADVANTAGE (KITS) ×4 IMPLANT
KIT HEART LEFT (KITS) ×2 IMPLANT
PACK CARDIAC CATHETERIZATION (CUSTOM PROCEDURE TRAY) ×2 IMPLANT
STENT PROMUS PREM MR 3.0X20 (Permanent Stent) ×2 IMPLANT
SYR MEDRAD MARK V 150ML (SYRINGE) ×2 IMPLANT
TRANSDUCER W/STOPCOCK (MISCELLANEOUS) ×2 IMPLANT
TUBING CIL FLEX 10 FLL-RA (TUBING) ×2 IMPLANT
WIRE COUGAR XT STRL 190CM (WIRE) ×2 IMPLANT
WIRE HI TORQ WHISPER MS 190CM (WIRE) ×4 IMPLANT
WIRE SAFE-T 1.5MM-J .035X260CM (WIRE) ×2 IMPLANT

## 2015-02-08 NOTE — Progress Notes (Signed)
UR Completed Jini Horiuchi Graves-Bigelow, RN,BSN [phone]  

## 2015-02-08 NOTE — Progress Notes (Signed)
Pt complaining of dizziness when she stands, feels like she might fall, BP 127/73, CBG 223, MD notified, will continue to monitor closely.  Edward Qualia RN

## 2015-02-08 NOTE — Progress Notes (Signed)
Patient Name: Cynthia Singh Date of Encounter: 02/08/2015  Active Problems:   Chest pain   Unstable angina pectoris   Length of Stay: 1  SUBJECTIVE  Dizzy standing up. No further angina.  CURRENT MEDS . amitriptyline  150 mg Oral QHS  . aspirin EC  81 mg Oral Daily  . atorvastatin  40 mg Oral q1800  . carvedilol  6.25 mg Oral BID WC  . heparin  5,000 Units Subcutaneous 3 times per day  . Influenza vac split quadrivalent PF  0.5 mL Intramuscular Tomorrow-1000  . insulin aspart  0-15 Units Subcutaneous TID WC  . insulin aspart  3 Units Subcutaneous TID WC  . insulin NPH Human  40 Units Subcutaneous QAC breakfast  . losartan  100 mg Oral Daily  . sodium chloride  3 mL Intravenous Q12H    OBJECTIVE   Intake/Output Summary (Last 24 hours) at 02/08/15 0927 Last data filed at 02/08/15 0449  Gross per 24 hour  Intake    120 ml  Output   1500 ml  Net  -1380 ml   Filed Weights   02/07/15 1418 02/07/15 1946 02/08/15 0502  Weight: 192 lb (87.091 kg) 190 lb (86.183 kg) 189 lb 13.1 oz (86.1 kg)    PHYSICAL EXAM Filed Vitals:   02/07/15 1745 02/07/15 1830 02/07/15 1946 02/08/15 0502  BP: 162/61 150/70 164/99 134/79  Pulse: 82 83 57 78  Temp:   98.4 F (36.9 C) 97.6 F (36.4 C)  TempSrc:   Oral Oral  Resp: 12 13 18 18   Height:   5\' 8"  (1.727 m)   Weight:   190 lb (86.183 kg) 189 lb 13.1 oz (86.1 kg)  SpO2: 99% 96% 96% 96%   General: Alert, oriented x3, no distress Head: no evidence of trauma, PERRL, EOMI, no exophtalmos or lid lag, no myxedema, no xanthelasma; normal ears, nose and oropharynx Neck: normal jugular venous pulsations and no hepatojugular reflux; brisk carotid pulses without delay and no carotid bruits Chest: clear to auscultation, no signs of consolidation by percussion or palpation, normal fremitus, symmetrical and full respiratory excursions Cardiovascular: normal position and quality of the apical impulse, regular rhythm, normal first and second heart  sounds, no rubs or gallops, no murmur Abdomen: no tenderness or distention, no masses by palpation, no abnormal pulsatility or arterial bruits, normal bowel sounds, no hepatosplenomegaly Extremities: no clubbing, cyanosis or edema; 2+ radial, ulnar and brachial pulses bilaterally; 2+ right femoral, posterior tibial and dorsalis pedis pulses; 2+ left femoral, posterior tibial and dorsalis pedis pulses; no subclavian or femoral bruits Neurological: grossly nonfocal  LABS  CBC  Recent Labs  02/07/15 1430  WBC 8.5  HGB 14.7  HCT 42.0  MCV 88.6  PLT 794   Basic Metabolic Panel  Recent Labs  02/07/15 1430 02/08/15 0710  NA 134* 134*  K 3.8 3.9  CL 100* 101  CO2 23 26  GLUCOSE 222* 237*  BUN 7 8  CREATININE 0.61 0.70  CALCIUM 9.2 8.6*   Liver Function Tests No results for input(s): AST, ALT, ALKPHOS, BILITOT, PROT, ALBUMIN in the last 72 hours. No results for input(s): LIPASE, AMYLASE in the last 72 hours. Cardiac Enzymes  Recent Labs  02/07/15 2055 02/08/15 0042 02/08/15 0710  TROPONINI <0.03 <0.03 <0.03   BNP Invalid input(s): POCBNP D-Dimer No results for input(s): DDIMER in the last 72 hours. Hemoglobin A1C No results for input(s): HGBA1C in the last 72 hours. Fasting Lipid Panel  Recent Labs  02/08/15  0042  CHOL 237*  HDL 30*  LDLCALC UNABLE TO CALCULATE IF TRIGLYCERIDE OVER 400 mg/dL  TRIG 697*  CHOLHDL 7.9   Thyroid Function Tests No results for input(s): TSH, T4TOTAL, T3FREE, THYROIDAB in the last 72 hours.  Invalid input(s): [place]  Radiology Studies Imaging results have been reviewed and Dg Chest 2 View  02/07/2015   CLINICAL DATA:  Acute left-sided chest pain radiating into left arm today.  EXAM: CHEST  2 VIEW  COMPARISON:  10/19/2012 and prior chest radiographs  FINDINGS: Upper limits normal heart size again noted.  A coronary artery stent is identified.  There is no evidence of focal airspace disease, pulmonary edema, suspicious pulmonary  nodule/mass, pleural effusion, or pneumothorax. No acute bony abnormalities are identified.  IMPRESSION: No evidence of acute cardiopulmonary disease.   Electronically Signed   By: Margarette Canada M.D.   On: 02/07/2015 15:02    TELE NSR  ECG Nonspecific changes  ASSESSMENT AND PLAN  Probable unstable angina, albeit without high-risk ECG or biomarkers. For cath today +/- PCI-stent. This procedure has been fully reviewed with the patient and written informed consent has been obtained.  Poorly controlled DM and severe mixed hyperlipidemia Poor compliance with diet and Endocrinology follow up, will work to correct that.  Essential HTN She reports compliance with meds at home. Her BP, however, has dropped significantly just with administration of chronic Rx. I wonder whether she has truly taken meds as prescribed. Alternatively, she may be hypovolemic. Increase IV fluids.    Sanda Klein, MD, [facility] [phone] office [phone] pager 02/08/2015 9:27 AM

## 2015-02-08 NOTE — Progress Notes (Signed)
Dr. Philbert Riser paged in reference to patient is scheduled for an 11:30am heart cath today and has a 0600 heparin sq ordered. Question as to hold or give. Returned call with order that it is ok to give 0600 heparin sq. [facility] [employer]

## 2015-02-08 NOTE — Progress Notes (Signed)
TR BAND REMOVAL  LOCATION:    left radial  DEFLATED PER PROTOCOL:    Yes.    TIME BAND OFF / DRESSING APPLIED:    1530   SITE UPON ARRIVAL:    Level 0  SITE AFTER BAND REMOVAL:    Level 1 ( small bruise , soft)  REVERSE ALLEN'S TEST:     positive  CIRCULATION SENSATION AND MOVEMENT:    Within Normal Limits   Yes.    COMMENTS:   Tolerated procedure well

## 2015-02-09 ENCOUNTER — Encounter (HOSPITAL_COMMUNITY): Payer: Self-pay | Admitting: Physician Assistant

## 2015-02-09 DIAGNOSIS — E785 Hyperlipidemia, unspecified: Secondary | ICD-10-CM

## 2015-02-09 DIAGNOSIS — I2510 Atherosclerotic heart disease of native coronary artery without angina pectoris: Secondary | ICD-10-CM

## 2015-02-09 DIAGNOSIS — I25110 Atherosclerotic heart disease of native coronary artery with unstable angina pectoris: Secondary | ICD-10-CM | POA: Diagnosis not present

## 2015-02-09 DIAGNOSIS — E1140 Type 2 diabetes mellitus with diabetic neuropathy, unspecified: Secondary | ICD-10-CM

## 2015-02-09 LAB — BASIC METABOLIC PANEL
Anion gap: 9 (ref 5–15)
BUN: 10 mg/dL (ref 6–20)
CO2: 24 mmol/L (ref 22–32)
Calcium: 8.7 mg/dL — ABNORMAL LOW (ref 8.9–10.3)
Chloride: 103 mmol/L (ref 101–111)
Creatinine, Ser: 0.66 mg/dL (ref 0.44–1.00)
GFR calc Af Amer: >60 mL/min (ref >60)
GFR calc non Af Amer: >60 mL/min (ref >60)
Glucose, Bld: 192 mg/dL — ABNORMAL HIGH (ref 65–99)
Potassium: 3.6 mmol/L (ref 3.5–5.1)
Sodium: 136 mmol/L (ref 135–145)

## 2015-02-09 LAB — CBC
HCT: 40.9 % (ref 36.0–46.0)
Hemoglobin: 13.8 g/dL (ref 12.0–15.0)
MCH: 30.1 pg (ref 26.0–34.0)
MCHC: 33.7 g/dL (ref 30.0–36.0)
MCV: 89.3 fL (ref 78.0–100.0)
Platelets: 193 10*3/uL (ref 150–400)
RBC: 4.58 MIL/uL (ref 3.87–5.11)
RDW: 13 % (ref 11.5–15.5)
WBC: 9.2 10*3/uL (ref 4.0–10.5)

## 2015-02-09 LAB — HEMOGLOBIN A1C
Hgb A1c MFr Bld: 11.1 % — ABNORMAL HIGH (ref 4.8–5.6)
Mean Plasma Glucose: 272 mg/dL

## 2015-02-09 LAB — GLUCOSE, CAPILLARY: Glucose-Capillary: 233 mg/dL — ABNORMAL HIGH (ref 65–99)

## 2015-02-09 MED ORDER — CARVEDILOL 12.5 MG PO TABS
12.5000 mg | ORAL_TABLET | Freq: Two times a day (BID) | ORAL | Status: DC
Start: 2015-02-09 — End: 2016-04-16

## 2015-02-09 MED ORDER — CARVEDILOL 12.5 MG PO TABS
12.5000 mg | ORAL_TABLET | Freq: Two times a day (BID) | ORAL | Status: DC
  Administered 2015-02-09: 12.5 mg via ORAL
  Filled 2015-02-09: qty 1

## 2015-02-09 MED ORDER — TICAGRELOR 90 MG PO TABS: 90.0000 mg | ORAL_TABLET | Freq: Two times a day (BID) | ORAL | Status: DC

## 2015-02-09 MED ORDER — ATORVASTATIN CALCIUM 80 MG PO TABS: 40.0000 mg | ORAL_TABLET | Freq: Every day | ORAL | Status: DC

## 2015-02-09 NOTE — Progress Notes (Signed)
CARDIAC REHAB PHASE I   PRE:  Rate/Rhythm: 72 SR  BP:  Sitting: 127/63        SaO2: 95 RA  MODE:  Ambulation: 250 ft   POST:  Rate/Rhythm: 86 sR  BP:  Sitting: 146/67         SaO2: 97 RA  Pt ambulated 250 ft on RA, handheld assist, fairly steady gait, tolerated well.  Pt c/o of bilateral knee pain limiting activity tolerance, states "my knees are bone on bone," denies DOE, cp, dizziness, declined rest stop. Completed PCI/stent education.  Reviewed risk factors, anti-platelet therapy, stent card, activity restrictions, ntg, exercise, heart healthy diet, carb counting, portion control, and phase 2 cardiac rehab. Pt verbalized understanding. Pt agrees to phase 2 cardiac rehab referral, will send to [facility]. Pt states some concern regarding the cost of brilinta, states she is not sure she will be able to afford it long term. RN and case manager notified. Pt to recliner after walk, call bell within reach.  [phone]  Lenna Sciara, RN, BSN 02/09/2015 9:25 AM

## 2015-02-09 NOTE — Discharge Summary (Signed)
Discharge Summary   Patient ID: Cynthia Singh,  MRN: [id number], DOB/AGE: 09-04-1940 74 y.o.  Admit date: 02/07/2015 Discharge date: 02/09/2015  Primary Care Provider: [name] Primary Cardiologist: Dr. Johnsie Cancel  Discharge Diagnoses Principal Problem:   Unstable angina pectoris Active Problems:   Type 2 diabetes, uncontrolled, with neuropathy   Hyperlipidemia   Essential hypertension   CAD (coronary artery disease)   Allergies No Known Allergies  Procedures  Cardiac catheterization 02/08/2015 Conclusion     Dist RCA-1 lesion, 30% stenosed.  Dist RCA-2 lesion, 40% stenosed.  Prox Cx lesion, 30% stenosed.  Prox LAD lesion, 80% stenosed. There is a 0% residual stenosis post intervention.  A drug-eluting stent was placed.  Ost 1st Diag lesion, 95% stenosed. There is a 40% residual stenosis post intervention.  Mid Cx lesion, 60% stenosed.  The left ventricular systolic function is normal.  1. Severe stenosis of the proximal LAD/first diagonal, treated successfully with stenting of the LAD and balloon angioplasty of the first diagonal ostium through the struts of the stented segment 2. Moderate stenosis of the left circumflex, enteric with pressure wire analysis with a normal value of 0.94 at peak hyperemia 3. Wide patency of the stented segment within the right coronary artery and mild nonobstructive disease of the distal vessel 4. Preserved LV systolic function  Recommendations: Dual antiplatelet therapy with aspirin and brilinta for at least 12 months. Would consider long-term DAPT or monotherapy with brilinta in this patient with diabetes and multivessel coronary artery disease.      Hospital Course  The patient is a 74 year old female with past medical history significant for CAD status post RCA stent in 2014, hypertension, hyperlipidemia, and uncontrolled IDDM. She presented to [facility] ED on 02/07/2015 with retrosternal chest pain that come and goes  with increasing frequency and woke her up from sleep earlier that day.  Her symptom was concerning for unstable angina, serial troponin were negative. Laboratory workup shows she has both uncontrolled hyperlipidemia and uncontrolled diabetes. Her hemoglobin A1c is 11.1. Her cholesterol is 237, triglycerides 697, unable to calculate LDL. Given her past medical history, uncontrolled risk factors, she agreed to undergo diagnostic cardiac catheterization. She underwent cardiac cath on the following morning 9/23 which showed 80% prox LAD lesion treated with 3.0 x 20 mm Promus DES, 95% ost D1 lesion treated with balloon angioplasty with 40% residual, 60% mid LCx lesion, EF 55-65%.   Post-cath, she recovered well. She was seen on the following morning, at which time she denies any significant chest discomfort or shortness of breath. She ambulated with cardiac rehabilitation without significant discomfort. Her left radial cath site appears to be stable with good distal pulses. She has been placed on aspirin and Brilinta. Her blood pressure is uncontrolled in the 160s, carvedilol was increased to 12.5 mg twice a day. She will continue on losartan. I have emphasized repeatedly regarding the need to follow-up with her PCP and endocrinologist regarding uncontrolled hyperlipidemia and diabetes and the relationship with her coronary artery disease. I have also stressed on the importance of compliance with DAPT. I will arrange 7-10 day follow-up in the cardiology office. She has been instructed to hold metformin for 48 hours after cath, she will restart metformin on 9/26. She has been on 40 mg daily of Lasix at home since 2015, of note Lasix was held during this admission and resumed on discharge.   Discharge Vitals Blood pressure 127/63, pulse 73, temperature 97.6 F (36.4 C), temperature source Oral, resp. rate 20,  height 5\' 8"  (1.727 m), weight 193 lb 14.4 oz (87.952 kg), SpO2 95 %.  Filed Weights   02/07/15 1946  02/08/15 0502 02/09/15 0354  Weight: 190 lb (86.183 kg) 189 lb 13.1 oz (86.1 kg) 193 lb 14.4 oz (87.952 kg)    Labs  CBC  Recent Labs  02/07/15 1430 02/09/15 0341  WBC 8.5 9.2  HGB 14.7 13.8  HCT 42.0 40.9  MCV 88.6 89.3  PLT 202 009   Basic Metabolic Panel  Recent Labs  02/08/15 0710 02/09/15 0341  NA 134* 136  K 3.9 3.6  CL 101 103  CO2 26 24  GLUCOSE 237* 192*  BUN 8 10  CREATININE 0.70 0.66  CALCIUM 8.6* 8.7*   Cardiac Enzymes  Recent Labs  02/07/15 2055 02/08/15 0042 02/08/15 0710  TROPONINI <0.03 <0.03 <0.03   Hemoglobin A1C  Recent Labs  02/08/15 1100  HGBA1C 11.1*   Fasting Lipid Panel  Recent Labs  02/08/15 0042  CHOL 237*  HDL 30*  LDLCALC UNABLE TO CALCULATE IF TRIGLYCERIDE OVER 400 mg/dL  TRIG 697*  CHOLHDL 7.9    Disposition  Pt is being discharged home today in good condition.  Follow-up Plans & Appointments      Follow-up Information    Follow up with Philemon Kingdom, MD.   Specialty:  Internal Medicine   Why:  Please continue to followup with your endocrinologist regarding uncontrolled diabetes   Contact information:   [address] [place] [address] [phone]       Follow up with [name], MD.   Specialties:  Internal Medicine, Pediatrics   Why:  Please followup with your primary care physician closely for uncontrolled cholesterol and diabetes   Contact information:   [place] [address] [phone]       Follow up with Jenkins Rouge, MD.   Specialty:  Cardiology   Why:  Office will call you in 2 business days to arrange followup, please give [place] a call if you do not hear from [place] to arrange 7-10 day office visit   Contact information:   [address] [place] [address] [phone]       Discharge Medications    Medication List    TAKE these medications        amitriptyline 150 MG tablet  Commonly known as:  ELAVIL    Take 1 tablet (150 mg total) by mouth at bedtime.     aspirin 81 MG EC tablet  Take 1 tablet (81 mg total) by mouth daily.  Notes to Patient:  Prevents clotting in stent and heart attack     atorvastatin 80 MG tablet  Commonly known as:  LIPITOR  Take 0.5 tablets (40 mg total) by mouth daily.  Notes to Patient:  Cholesterol     B-D UF III MINI PEN NEEDLES 31G X 5 MM Misc  Generic drug:  Insulin Pen Needle  USE AS DIRECTED WITH LANTUS SOLOSTAR     carvedilol 12.5 MG tablet  Commonly known as:  COREG  Take 1 tablet (12.5 mg total) by mouth 2 (two) times daily with a meal.  Notes to Patient:  Decreases work of the heart, heart rate & blood pressure     furosemide 40 MG tablet  Commonly known as:  LASIX  Take 1 tablet (40 mg total) by mouth daily.  Notes to Patient:  Fluid pill     insulin NPH Human 100  UNIT/ML injection  Commonly known as:  NOVOLIN N RELION  Inject 40 units a day as advised     insulin regular 100 units/mL injection  Commonly known as:  NOVOLIN R RELION  Inject 0.08-0.1 mLs (8-10 Units total) into the skin 3 (three) times daily before meals.     Insulin Syringe-Needle U-100 31G X 5/16" 0.5 ML Misc  Commonly known as:  RELION INSULIN SYR 0.5ML/31G  Use 3x a day     losartan 100 MG tablet  Commonly known as:  COZAAR  TAKE 1 TABLET BY MOUTH DAILY     metFORMIN 1000 MG tablet  Commonly known as:  GLUCOPHAGE  Take 1 tablet (1,000 mg total) by mouth 2 (two) times daily with a meal.     nabumetone 500 MG tablet  Commonly known as:  RELAFEN  Take 1 tablet (500 mg total) by mouth daily.  Notes to Patient:  Osteoarthritis      nitroGLYCERIN 0.4 MG SL tablet  Commonly known as:  NITROSTAT  Place 1 tablet (0.4 mg total) under the tongue every 5 (five) minutes as needed for chest pain.     oxyCODONE-acetaminophen 10-325 MG per tablet  Commonly known as:  PERCOCET  Take 1-2 tablets by mouth 4 (four) times daily as needed for pain.     ticagrelor 90 MG Tabs  tablet  Commonly known as:  BRILINTA  Take 1 tablet (90 mg total) by mouth 2 (two) times daily.  Notes to Patient:  Prevents clotting in stent and heart attack     zoster vaccine live (PF) 19400 UNT/0.65ML injection  Commonly known as:  ZOSTAVAX  Inject 19,400 Units into the skin once.        Duration of Discharge Encounter   Greater than 30 minutes including physician time.  Hilbert Corrigan PA-C Pager: [phone] 02/09/2015, 9:32 AM

## 2015-02-09 NOTE — Discharge Instructions (Signed)
No driving for 24 hours. No lifting over 5 lbs for 1 week. No sexual activity for 1 week. Keep procedure site clean & dry. If you notice increased pain, swelling, bleeding or pus, call/return!  You may shower, but no soaking baths/hot tubs/pools for 1 week.   Please hold Metformin for 48 hours after cath due to interaction with contrast dye. You can restart Metformin on Monday 02/11/2015

## 2015-02-09 NOTE — Progress Notes (Signed)
Patient Name: Cynthia Singh Date of Encounter: 02/09/2015  Primary Cardiologist: Dr. Hochrein/Cooper   Principal Problem:   Unstable angina pectoris Active Problems:   Type 2 diabetes, uncontrolled, with neuropathy   Hyperlipidemia   Essential hypertension   CAD (coronary artery disease)    SUBJECTIVE  Denies any CP or SOB.   CURRENT MEDS . amitriptyline  150 mg Oral QHS  . aspirin EC  81 mg Oral Daily  . atorvastatin  80 mg Oral q1800  . carvedilol  6.25 mg Oral BID WC  . heparin  5,000 Units Subcutaneous 3 times per day  . Influenza vac split quadrivalent PF  0.5 mL Intramuscular Tomorrow-1000  . insulin aspart  0-15 Units Subcutaneous TID WC  . insulin aspart  3 Units Subcutaneous TID WC  . insulin NPH Human  40 Units Subcutaneous QAC breakfast  . losartan  100 mg Oral Daily  . sodium chloride  3 mL Intravenous Q12H  . ticagrelor  90 mg Oral BID    OBJECTIVE  Filed Vitals:   02/09/15 0354 02/09/15 0400 02/09/15 0500 02/09/15 0600  BP: 155/64 150/97 126/48 164/64  Pulse: 69     Temp: 97.4 F (36.3 C)     TempSrc: Oral     Resp: 17 16 17 22   Height:      Weight: 193 lb 14.4 oz (87.952 kg)     SpO2: 97% 98% 94% 95%    Intake/Output Summary (Last 24 hours) at 02/09/15 0826 Last data filed at 02/09/15 0800  Gross per 24 hour  Intake 1106.25 ml  Output   2700 ml  Net -1593.75 ml   Filed Weights   02/07/15 1946 02/08/15 0502 02/09/15 0354  Weight: 190 lb (86.183 kg) 189 lb 13.1 oz (86.1 kg) 193 lb 14.4 oz (87.952 kg)    PHYSICAL EXAM  General: Pleasant, NAD. Neuro: Alert and oriented X 3. Moves all extremities spontaneously. Psych: Normal affect. HEENT:  Normal  Neck: Supple without bruits or JVD. Lungs:  Resp regular and unlabored, CTA. Heart: RRR no s3, s4, or murmurs. L radial cath site stable, 2+ distal radial pulse Abdomen: Soft, non-tender, non-distended, BS + x 4.  Extremities: No clubbing, cyanosis or edema. DP/PT/Radials 2+ and equal  bilaterally.  Accessory Clinical Findings  CBC  Recent Labs  02/07/15 1430 02/09/15 0341  WBC 8.5 9.2  HGB 14.7 13.8  HCT 42.0 40.9  MCV 88.6 89.3  PLT 202 956   Basic Metabolic Panel  Recent Labs  02/08/15 0710 02/09/15 0341  NA 134* 136  K 3.9 3.6  CL 101 103  CO2 26 24  GLUCOSE 237* 192*  BUN 8 10  CREATININE 0.70 0.66  CALCIUM 8.6* 8.7*   Cardiac Enzymes  Recent Labs  02/07/15 2055 02/08/15 0042 02/08/15 0710  TROPONINI <0.03 <0.03 <0.03   Hemoglobin A1C  Recent Labs  02/08/15 1100  HGBA1C 11.1*   Fasting Lipid Panel  Recent Labs  02/08/15 0042  CHOL 237*  HDL 30*  LDLCALC UNABLE TO CALCULATE IF TRIGLYCERIDE OVER 400 mg/dL  TRIG 697*  CHOLHDL 7.9    TELE NSR with HR 60-70s    ECG  NSR without significant ST-T wave changes  Echocardiogram 10/20/2012  - Left ventricle: The cavity size was normal. Wall thickness was increased in a pattern of mild LVH. Systolic function was normal. The estimated ejection fraction was in the range of 55% to 60%. - Right ventricle: RV function is normal to mildly decreased.  Radiology/Studies  Dg Chest 2 View  02/07/2015   CLINICAL DATA:  Acute left-sided chest pain radiating into left arm today.  EXAM: CHEST  2 VIEW  COMPARISON:  10/19/2012 and prior chest radiographs  FINDINGS: Upper limits normal heart size again noted.  A coronary artery stent is identified.  There is no evidence of focal airspace disease, pulmonary edema, suspicious pulmonary nodule/mass, pleural effusion, or pneumothorax. No acute bony abnormalities are identified.  IMPRESSION: No evidence of acute cardiopulmonary disease.   Electronically Signed   By: Margarette [place] M.D.   On: 02/07/2015 15:02    ASSESSMENT AND PLAN  1. Unstable angina  - cath 02/08/2015 80% prox LAD lesion treated with 3.0 x 20 mm Promus DES, 95% ost D1 lesion treated with balloon angioplasty with 40% residual, 60% mid LCx lesion, EF 55-65%  - continue  ASA, coreg, losartan, brilinta and statin. Emphasized on need to be compliant with DAPT and danger of missed medication. Stable for discharge today  2. CAD s/p RCA stent 2014  3. Uncontrolled IDDM: hgb A1C 11.1  4. HTN: SBP 160, will increase coreg to 12.5mg  BID  5. HLD   Signed, Almyra Deforest PA-C Pager: [phone]

## 2015-02-11 ENCOUNTER — Telehealth: Payer: Self-pay | Admitting: *Deleted

## 2015-02-11 ENCOUNTER — Telehealth: Payer: Self-pay | Admitting: Cardiovascular Disease

## 2015-02-11 LAB — HEMOGLOBIN A1C
Hgb A1c MFr Bld: 10.8 % — ABNORMAL HIGH (ref 4.8–5.6)
Mean Plasma Glucose: 263 mg/dL

## 2015-02-11 NOTE — Telephone Encounter (Signed)
TOC Phone Call . Appt is on 02/21/15 at 10am  w/ Sharrell Ku at the [facility].  Thanks

## 2015-02-11 NOTE — Telephone Encounter (Signed)
Patient contacted regarding discharge from {[place] 9/24 Patient understands to follow up with provider Dayna Dunn 10/6 10am at [facility] Patient understands discharge instructions? Yes Patient understands medications and regiment? Yes Patient understands to bring all medications to this visit? Yes

## 2015-02-11 NOTE — Telephone Encounter (Signed)
I spoke to her today Unstable angina and had cath/stents Doing well now Should just be able to follow with cardiology unless something new comes up

## 2015-02-11 NOTE — Telephone Encounter (Signed)
Transition Care Management Follow-up Telephone Call   Date discharged?   How have you been since you were released from the hospital? No chest pain   Do you understand why you were in the hospital? yes   Do you understand the discharge instructions? yes   Where were you discharged to? Home   Items Reviewed:  Medications reviewed: yes  Allergies reviewed: yes  Dietary changes reviewed: no  Referrals reviewed: yes, cardiology   Functional Questionnaire:   Activities of Daily Living (ADLs):   She states they are independent in the following: ambulation, bathing and hygiene, feeding, continence, grooming, toileting and dressing States they require assistance with the following: None   Any transportation issues/concerns?: no   Any patient concerns? no   Confirmed importance and date/time of follow-up visits scheduled yes, none scheduled at this time per Dr. Silvio Pate  Confirmed with patient if condition begins to worsen call PCP or go to the ER.  Patient was given the office number and encouraged to call back with question or concerns.  : yes

## 2015-02-15 ENCOUNTER — Other Ambulatory Visit: Payer: Self-pay

## 2015-02-15 MED ORDER — OXYCODONE-ACETAMINOPHEN 10-325 MG PO TABS: 1.0000 | ORAL_TABLET | Freq: Four times a day (QID) | ORAL | Status: DC | PRN

## 2015-02-15 NOTE — Telephone Encounter (Signed)
Spoke with patient and advised rx ready for pick-up and it will be at the front desk.  

## 2015-02-15 NOTE — Telephone Encounter (Signed)
Pt left v/m requesting rx oxycodone apap. Call when ready for pick up. rx last printed # 240 on 01/17/15. Last annual exam on 04/30/14.

## 2015-02-20 ENCOUNTER — Telehealth: Payer: Self-pay | Admitting: *Deleted

## 2015-02-20 NOTE — Telephone Encounter (Signed)
Signed Cardiac Rehabilitation form phase II was faxed back

## 2015-02-21 ENCOUNTER — Encounter: Payer: Self-pay | Admitting: Physician Assistant

## 2015-02-21 ENCOUNTER — Encounter: Payer: PPO | Admitting: Physician Assistant

## 2015-02-21 NOTE — Progress Notes (Signed)
This encounter was created in error - please disregard.

## 2015-02-21 NOTE — Progress Notes (Deleted)
Cardiology Office Note Date:  02/21/2015  Patient ID:  Cynthia, Singh 16-Sep-1940, MRN [id number] PCP:  [name], MD  Cardiologist:  Dr. Hochrein/Coooper  ***refresh   Chief Complaint: f/u cath  History of Present Illness: Cynthia Singh is a 74 y.o. female with history of CAD (s/p RCA stent 2014, [place] s/p DES to prox LAD & balloon angioplasty to D1 in 01/2015), HTN, HLD, uncontrolled IDDM who presents for post-hospital follow-up. She was recently admitted for unstable angina. She ruled out fo rMI. She underwent cath 02/08/15 showing 80% prox LAD lesion treated with 3.0 x 20 mm Promus DES, 95% ost D1 lesion treated with balloon angioplasty with 40% residual, 60% mid LCx lesion, EF 55-65%. DAPT for at least 12 months was recommended - Dr. Antionette Char cath note mentions "Would consider long-term DAPT or monotherapy with brilinta in this patient with diabetes and multivessel coronary artery disease." Labwork also demonstrated both uncontrolled HLD (trig 697, unable to calculate LDL) and uncontrolled DM (A1C 11.1). Close f/u with PCP/endocrinologist was encouraged.   cath site  toc visit who is doc   Past Medical History  Diagnosis Date  . Osteoarthritis of spine     knees also  . Dyslipidemia   . Hypertension   . Diabetes mellitus, type 2 ([place])   . Episodic mood disorder ([place])   . CAD (coronary artery disease)     a. Remote nonobstructive disease but in 10/2012 Cath/PCI: s/p DES to RCA. b. cath 02/08/15 DES to prox LAD and balloon angioplasty of ost D1, EF 55-65%.  . Obesity     Past Surgical History  Procedure Laterality Date  . Gallbladder surgery  1968  . Appendectomy  1959  . Combined hysterectomy abdominal w/ a&p repair / oophorectomy  1968  . Multiple d&c    . Tonsillectomy  age 11  . Coronary stent placement  2014  . Left heart catheterization with coronary angiogram N/A 10/20/2012    Procedure: LEFT HEART CATHETERIZATION WITH CORONARY ANGIOGRAM;  Surgeon: Sherren Mocha, MD;  Location: [facility] CATH LAB;  Service: Cardiovascular;  Laterality: N/A;  . Cholecystectomy    . Cardiac catheterization N/A 02/08/2015    Procedure: Left Heart Cath and Coronary Angiography;  Surgeon: Sherren Mocha, MD;  Location: [place] CV LAB;  Service: Cardiovascular;  Laterality: N/A;  . Cardiac catheterization N/A 02/08/2015    Procedure: Coronary Stent Intervention;  Surgeon: Sherren Mocha, MD;  Location: [place] CV LAB;  Service: Cardiovascular;  Laterality: N/A;  . Cardiac catheterization N/A 02/08/2015    Procedure: Intravascular Pressure Wire/FFR Study;  Surgeon: Sherren Mocha, MD;  Location: [place] CV LAB;  Service: Cardiovascular;  Laterality: N/A;    Current Outpatient Prescriptions  Medication Sig Dispense Refill  . amitriptyline (ELAVIL) 150 MG tablet Take 1 tablet (150 mg total) by mouth at bedtime. 90 tablet 3  . aspirin EC 81 MG EC tablet Take 1 tablet (81 mg total) by mouth daily.    Marland Kitchen atorvastatin (LIPITOR) 80 MG tablet Take 0.5 tablets (40 mg total) by mouth daily. 90 tablet 3  . B-D UF III MINI PEN NEEDLES 31G X 5 MM MISC USE AS DIRECTED WITH LANTUS SOLOSTAR 100 each 11  . carvedilol (COREG) 12.5 MG tablet Take 1 tablet (12.5 mg total) by mouth 2 (two) times daily with a meal. 60 tablet 5  . furosemide (LASIX) 40 MG tablet Take 1 tablet (40 mg total) by mouth daily. 30 tablet 3  . insulin NPH  Human (NOVOLIN N RELION) 100 UNIT/ML injection Inject 40 units a day as advised (Patient taking differently: Inject 40 Units into the skin daily before breakfast. Inject 40 units a day as advised) 20 mL 2  . insulin regular (NOVOLIN R RELION) 100 units/mL injection Inject 0.08-0.1 mLs (8-10 Units total) into the skin 3 (three) times daily before meals. (Patient not taking: Reported on 02/07/2015) 10 mL 2  . Insulin Syringe-Needle U-100 (RELION INSULIN SYR 0.5ML/31G) 31G X 5/16" 0.5 ML MISC Use 3x a day 200 each 6  . losartan (COZAAR) 100 MG tablet TAKE 1 TABLET BY  MOUTH DAILY 90 tablet 1  . metFORMIN (GLUCOPHAGE) 1000 MG tablet Take 1 tablet (1,000 mg total) by mouth 2 (two) times daily with a meal. 60 tablet 5  . nabumetone (RELAFEN) 500 MG tablet Take 1 tablet (500 mg total) by mouth daily. 20 tablet 0  . nitroGLYCERIN (NITROSTAT) 0.4 MG SL tablet Place 1 tablet (0.4 mg total) under the tongue every 5 (five) minutes as needed for chest pain. 30 tablet 12  . oxyCODONE-acetaminophen (PERCOCET) 10-325 MG tablet Take 1-2 tablets by mouth 4 (four) times daily as needed for pain. 240 tablet 0  . ticagrelor (BRILINTA) 90 MG TABS tablet Take 1 tablet (90 mg total) by mouth 2 (two) times daily. 180 tablet 3  . zoster vaccine live, PF, (ZOSTAVAX) 34742 UNT/0.65ML injection Inject 19,400 Units into the skin once. 1 each 0   No current facility-administered medications for this visit.    Allergies:   Review of patient's allergies indicates no known allergies.   Social History:  The patient  reports that she has never smoked. She has never used smokeless tobacco. She reports that she does not drink alcohol or use illicit drugs.   Family History:  The patient's family history includes Cancer in her brother. There is no history of Coronary artery disease or Heart attack.***  ROS:  Please see the history of present illness. Otherwise, review of systems is positive for ***.   All other systems are reviewed and otherwise negative.   PHYSICAL EXAM: *** VS:  There were no vitals taken for this visit. BMI: There is no weight on file to calculate BMI. Well nourished, well developed, in no acute distress HEENT: normocephalic, atraumatic Neck: no JVD, carotid bruits or masses Cardiac:  normal S1, S2; RRR; no murmurs, rubs, or gallops Lungs:  clear to auscultation bilaterally, no wheezing, rhonchi or rales Abd: soft, nontender, no hepatomegaly, + BS MS: no deformity or atrophy Ext: no edema Skin: warm and dry, no rash Neuro:  moves all extremities spontaneously, no  focal abnormalities noted, follows commands Psych: euthymic mood, full affect   EKG:  Done today shows ***  Recent Labs: 02/09/2015: BUN 10; Creatinine, Ser 0.66; Hemoglobin 13.8; Platelets 193; Potassium 3.6; Sodium 136  02/08/2015: Cholesterol 237*; HDL 30*; LDL Cholesterol UNABLE TO CALCULATE IF TRIGLYCERIDE OVER 400 mg/dL; Total CHOL/HDL Ratio 7.9; Triglycerides 697*; VLDL UNABLE TO CALCULATE IF TRIGLYCERIDE OVER 400 mg/dL   Estimated Creatinine Clearance: 74.8 mL/min (by C-G formula based on Cr of 0.66).   Wt Readings from Last 3 Encounters:  02/09/15 193 lb 14.4 oz (87.952 kg)  12/23/14 180 lb (81.647 kg)  04/30/14 205 lb (92.987 kg)     Other studies reviewed: Additional studies/records reviewed today include: summarized above***  ASSESSMENT AND PLAN:  1. CAD s/p recent PCI 2. Essential HTN 3. Dyslipidemia 4. Uncontrolled diabetes mellitus  Disposition: F/u with ***  Current medicines  are reviewed at length with the patient today.  The patient did not have any concerns regarding medicines.***  Signed, Melina Copa PA-C 02/21/2015 7:28 AM     [facility] [address] [place] [address] [phone] (office)  [phone] (fax)

## 2015-03-05 ENCOUNTER — Ambulatory Visit: Payer: PPO | Admitting: Physician Assistant

## 2015-03-06 ENCOUNTER — Encounter: Payer: PPO | Admitting: Physician Assistant

## 2015-03-13 ENCOUNTER — Telehealth (HOSPITAL_COMMUNITY): Payer: Self-pay | Admitting: Cardiac Rehabilitation

## 2015-03-13 NOTE — Telephone Encounter (Signed)
Second attempt to contact pt via phone to enroll in cardiac rehab.  No answer, no voicemail. Letter mailed to pt home address.

## 2015-03-15 ENCOUNTER — Other Ambulatory Visit: Payer: Self-pay

## 2015-03-15 MED ORDER — OXYCODONE-ACETAMINOPHEN 10-325 MG PO TABS: 1.0000 | ORAL_TABLET | Freq: Four times a day (QID) | ORAL | Status: DC | PRN

## 2015-03-15 NOTE — Telephone Encounter (Signed)
Last filled 02/15/15 #240--please advise

## 2015-03-15 NOTE — Telephone Encounter (Signed)
Spoke to pt and informed her Rx is available for pickup from the front desk 

## 2015-03-21 ENCOUNTER — Ambulatory Visit: Payer: Commercial Managed Care - HMO | Admitting: Internal Medicine

## 2015-03-25 ENCOUNTER — Ambulatory Visit (INDEPENDENT_AMBULATORY_CARE_PROVIDER_SITE_OTHER): Payer: PPO | Admitting: Physician Assistant

## 2015-03-25 ENCOUNTER — Encounter: Payer: Self-pay | Admitting: Physician Assistant

## 2015-03-25 VITALS — BP 138/66 | HR 65 | Ht 68.0 in | Wt 195.2 lb

## 2015-03-25 DIAGNOSIS — Z9861 Coronary angioplasty status: Secondary | ICD-10-CM | POA: Diagnosis not present

## 2015-03-25 DIAGNOSIS — Z79899 Other long term (current) drug therapy: Secondary | ICD-10-CM

## 2015-03-25 DIAGNOSIS — I2510 Atherosclerotic heart disease of native coronary artery without angina pectoris: Secondary | ICD-10-CM | POA: Diagnosis not present

## 2015-03-25 DIAGNOSIS — E785 Hyperlipidemia, unspecified: Secondary | ICD-10-CM

## 2015-03-25 MED ORDER — CLOPIDOGREL BISULFATE 75 MG PO TABS: 75.0000 mg | ORAL_TABLET | Freq: Every day | ORAL | Status: DC

## 2015-03-25 NOTE — Patient Instructions (Signed)
Medication Instructions:  START Clopidogrel (Plavix) 75 mg - Take 5 tablets TODAY at lunch AND supper. Tomorrow (and thereafter) take 1 tablet by mouth daily. A new prescription has been sent to your pharmacy electronically.  Labwork: Your physician recommends that you return for lab work in December (2nd or 3rd week) - FASTING.  Testing/Procedures: NONE  Follow-Up: Rosaria Ferries, PA-C, recommends that you schedule a follow-up appointment in 3 months with Dr Johnsie Cancel.  If you need a refill on your cardiac medications before your next appointment, please call your pharmacy.

## 2015-03-25 NOTE — Progress Notes (Signed)
Cardiology Office Note   Date:  03/25/2015   ID:  Cynthia Singh, DOB Aug 27, 1940, MRN [id number]  PCP:  [name], MD  Cardiologist:  Dr Normajean Baxter, PA-C   Chief Complaint  Patient presents with  . Hospitalization Follow-up    pt has some dizziness  . Chest Pain    pt states she has some chest pain but states she doesn't think its anything to worry about   . Edema    no swelling in legs  . Shortness of Breath    every once and a while but not on going    History of Present Illness: Cynthia Singh is a 74 y.o. female with a history of RCA stent in 2014, hypertension, hyperlipidemia, and uncontrolled IDDM. She had a DES to the LAD 02/08/2015.      Cynthia Singh presents for follow-up after that intervention.  She has occasional chest pain, but she doesn't think it is cardiac. She has no consistent chest pain with exertion. She has not had lower extremity edema. Her cath sites have healed in well. She has occasional dyspnea on exertion, but feels that she is doing better and improving. She is willing to do cardiac rehabilitation.  She will not be able to afford the Brilinta. When she first went to the pharmacy, she had previously gotten a 30 day free prescription so they charged her $45. However, when she went back to pick up a refill, it was well over $100 and she states she could not afford it so she did not get it. She has been out of Brilinta for 3 days.  She has had no chest pain or shortness of breath in the last 3 days.  Past Medical History  Diagnosis Date  . Osteoarthritis of spine     knees also  . Dyslipidemia   . Hypertension   . Diabetes mellitus, type 2 ([place])   . Episodic mood disorder ([place])   . CAD (coronary artery disease)     a. Remote nonobstructive disease but in 10/2012 Cath/PCI: s/p DES to RCA. b. cath 02/08/15 DES to prox LAD and balloon angioplasty of ost D1, EF 55-65%.  . Obesity     Past Surgical History  Procedure  Laterality Date  . Gallbladder surgery  1968  . Appendectomy  1959  . Combined hysterectomy abdominal w/ a&p repair / oophorectomy  1968  . Multiple d&c    . Tonsillectomy  age 39  . Coronary stent placement  2014  . Left heart catheterization with coronary angiogram N/A 10/20/2012    Procedure: LEFT HEART CATHETERIZATION WITH CORONARY ANGIOGRAM;  Surgeon: Sherren Mocha, MD;  Location[facility] CATH LAB;  Service: Cardiovascular;  Laterality: N/A;  . Cholecystectomy    . Cardiac catheterization N/A 02/08/2015    Procedure: Left Heart Cath and Coronary Angiography;  Surgeon: Sherren Mocha, MD;  Location: [place] CV LAB;  Service: Cardiovascular;  Laterality: N/A;  . Cardiac catheterization N/A 02/08/2015    Procedure: Coronary Stent Intervention;  Surgeon: Sherren Mocha, MD;  Location: [place] CV LAB;  Service: Cardiovascular;  Laterality: N/A;  . Cardiac catheterization N/A 02/08/2015    Procedure: Intravascular Pressure Wire/FFR Study;  Surgeon: Sherren Mocha, MD;  Location: [place] CV LAB;  Service: Cardiovascular;  Laterality: N/A;    Current Outpatient Prescriptions  Medication Sig Dispense Refill  . amitriptyline (ELAVIL) 150 MG tablet Take 1 tablet (150 mg total) by mouth at bedtime. 90 tablet 3  .  aspirin EC 81 MG EC tablet Take 1 tablet (81 mg total) by mouth daily.    Marland Kitchen atorvastatin (LIPITOR) 80 MG tablet Take 0.5 tablets (40 mg total) by mouth daily. 90 tablet 3  . B-D UF III MINI PEN NEEDLES 31G X 5 MM MISC USE AS DIRECTED WITH LANTUS SOLOSTAR 100 each 11  . carvedilol (COREG) 12.5 MG tablet Take 1 tablet (12.5 mg total) by mouth 2 (two) times daily with a meal. 60 tablet 5  . furosemide (LASIX) 40 MG tablet Take 1 tablet (40 mg total) by mouth daily. 30 tablet 3  . insulin NPH Human (NOVOLIN N RELION) 100 UNIT/ML injection Inject 40 units a day as advised (Patient taking differently: Inject 40 Units into the skin daily before breakfast. Inject 40 units a day as advised) 20  mL 2  . insulin regular (NOVOLIN R RELION) 100 units/mL injection Inject 0.08-0.1 mLs (8-10 Units total) into the skin 3 (three) times daily before meals. (Patient not taking: Reported on 02/07/2015) 10 mL 2  . Insulin Syringe-Needle U-100 (RELION INSULIN SYR 0.5ML/31G) 31G X 5/16" 0.5 ML MISC Use 3x a day 200 each 6  . losartan (COZAAR) 100 MG tablet TAKE 1 TABLET BY MOUTH DAILY 90 tablet 1  . metFORMIN (GLUCOPHAGE) 1000 MG tablet Take 1 tablet (1,000 mg total) by mouth 2 (two) times daily with a meal. 60 tablet 5  . nabumetone (RELAFEN) 500 MG tablet Take 1 tablet (500 mg total) by mouth daily. 20 tablet 0  . nitroGLYCERIN (NITROSTAT) 0.4 MG SL tablet Place 1 tablet (0.4 mg total) under the tongue every 5 (five) minutes as needed for chest pain. 30 tablet 12  . oxyCODONE-acetaminophen (PERCOCET) 10-325 MG tablet Take 1-2 tablets by mouth 4 (four) times daily as needed for pain. 240 tablet 0  . ticagrelor (BRILINTA) 90 MG TABS tablet Take 1 tablet (90 mg total) by mouth 2 (two) times daily. 180 tablet 3  . zoster vaccine live, PF, (ZOSTAVAX) 12878 UNT/0.65ML injection Inject 19,400 Units into the skin once. 1 each 0   No current facility-administered medications for this visit.    Allergies:   Review of patient's allergies indicates no known allergies.    Social History:  The patient  reports that she has never smoked. She has never used smokeless tobacco. She reports that she does not drink alcohol or use illicit drugs.   Family History:  The patient's family history includes Cancer in her brother. There is no history of Coronary artery disease or Heart attack.    ROS:  Please see the history of present illness. All other systems are reviewed and negative.    PHYSICAL EXAM: VS:  BP 138/66 mmHg  Pulse 65  Ht 5\' 8"  (1.727 m)  Wt 195 lb 3.2 oz (88.542 kg)  BMI 29.69 kg/m2 , BMI Body mass index is 29.69 kg/(m^2). GEN: Well nourished, well developed, female in no acute distress HEENT:  normal for age  Neck: no JVD, no carotid bruit, no masses Cardiac: RRR; soft murmur, no rubs, or gallops Respiratory:  clear to auscultation bilaterally, normal work of breathing, patient inspirations are generally not very deep, but are clear and not labored GI: soft, nontender, nondistended, + BS MS: no deformity or atrophy; no edema; distal pulses are 2+ in all 4 extremities  Skin: warm and dry, no rash Neuro:  Strength and sensation are intact Psych: euthymic mood, full affect   EKG:  EKG is ordered today. The ekg ordered today  demonstrates sinus rhythm, no acute ischemic changes   Recent Labs: 02/09/2015: BUN 10; Creatinine, Ser 0.66; Hemoglobin 13.8; Platelets 193; Potassium 3.6; Sodium 136    Lipid Panel    Component Value Date/Time   CHOL 237* 02/08/2015 0042   TRIG 697* 02/08/2015 0042   HDL 30* 02/08/2015 0042   CHOLHDL 7.9 02/08/2015 0042   VLDL UNABLE TO CALCULATE IF TRIGLYCERIDE OVER 400 mg/dL 02/08/2015 0042   LDLCALC UNABLE TO CALCULATE IF TRIGLYCERIDE OVER 400 mg/dL 02/08/2015 0042   LDLDIRECT 63.7 12/25/2013 1634     Wt Readings from Last 3 Encounters:  03/25/15 195 lb 3.2 oz (88.542 kg)  02/09/15 193 lb 14.4 oz (87.952 kg)  12/23/14 180 lb (81.647 kg)     Other studies Reviewed: Additional studies/ records that were reviewed today include: Hospital records, previous office notes, cath films.  ASSESSMENT AND PLAN:  1.  CAD: She had a drug-eluting stent the LAD 02/08/2015. She has been out of her antiplatelet medication for 3 days. We will load her with Plavix, she will take 300 mg twice today for a full 600 mg load, then start 75 mg daily tomorrow. If she has any chest pain or shortness of breath, she is call 911  2. Hyperlipidemia: She was will follow-up with Dr. Johnsie Cancel in December, and get blood work done at that time if it is not performed by her family physician.  Compliance with her medications in general is reinforced. If she has future problems  with her medication she is to contact us prior to running out.   Current medicines are reviewed at length with the patient today.  The patient does not have concerns regarding medicines.  The following changes have been made:  DC Brilinta, add Plavix  Labs/ tests ordered today include:   Orders Placed This Encounter  Procedures  . Comprehensive metabolic panel  . CBC  . Lipid panel  . EKG 12-Lead     Disposition:   FU with Dr. Johnsie Cancel  Signed, Rosaria Ferries, PA-C  03/25/2015 1:13 PM    [facility] [address], [place], [place]  [address] Phone: [phone]; Fax: [phone]

## 2015-04-08 ENCOUNTER — Encounter: Payer: Self-pay | Admitting: Cardiovascular Disease

## 2015-04-15 ENCOUNTER — Other Ambulatory Visit: Payer: Self-pay

## 2015-04-15 MED ORDER — OXYCODONE-ACETAMINOPHEN 10-325 MG PO TABS: 1.0000 | ORAL_TABLET | Freq: Four times a day (QID) | ORAL | Status: DC | PRN

## 2015-04-15 NOTE — Telephone Encounter (Signed)
Patient notified that Rx was ready for pick up

## 2015-04-15 NOTE — Telephone Encounter (Signed)
Pt left v/m requesting rx oxycodone apap. Call when ready for pick up.rx last printed # 240 on 03/15/15. Last annual exam on 04/30/14 and pt scheduled for annual exam on 05/08/15.

## 2015-05-07 ENCOUNTER — Ambulatory Visit: Payer: PPO | Admitting: Cardiovascular Disease

## 2015-05-08 ENCOUNTER — Encounter: Payer: Self-pay | Admitting: Internal Medicine

## 2015-05-08 ENCOUNTER — Ambulatory Visit (INDEPENDENT_AMBULATORY_CARE_PROVIDER_SITE_OTHER): Payer: PPO | Admitting: Internal Medicine

## 2015-05-08 VITALS — BP 130/80 | HR 75 | Temp 98.0°F | Ht 68.0 in | Wt 194.0 lb

## 2015-05-08 DIAGNOSIS — F39 Unspecified mood [affective] disorder: Secondary | ICD-10-CM

## 2015-05-08 DIAGNOSIS — E1165 Type 2 diabetes mellitus with hyperglycemia: Secondary | ICD-10-CM | POA: Diagnosis not present

## 2015-05-08 DIAGNOSIS — I10 Essential (primary) hypertension: Secondary | ICD-10-CM

## 2015-05-08 DIAGNOSIS — E1140 Type 2 diabetes mellitus with diabetic neuropathy, unspecified: Secondary | ICD-10-CM

## 2015-05-08 DIAGNOSIS — I25119 Atherosclerotic heart disease of native coronary artery with unspecified angina pectoris: Secondary | ICD-10-CM

## 2015-05-08 DIAGNOSIS — M25562 Pain in left knee: Secondary | ICD-10-CM

## 2015-05-08 DIAGNOSIS — M25561 Pain in right knee: Secondary | ICD-10-CM | POA: Diagnosis not present

## 2015-05-08 DIAGNOSIS — IMO0002 Reserved for concepts with insufficient information to code with codable children: Secondary | ICD-10-CM

## 2015-05-08 DIAGNOSIS — Z0000 Encounter for general adult medical examination without abnormal findings: Secondary | ICD-10-CM | POA: Diagnosis not present

## 2015-05-08 DIAGNOSIS — Z1211 Encounter for screening for malignant neoplasm of colon: Secondary | ICD-10-CM

## 2015-05-08 DIAGNOSIS — Z7189 Other specified counseling: Secondary | ICD-10-CM

## 2015-05-08 LAB — COMPREHENSIVE METABOLIC PANEL
ALT: 14 U/L (ref 0–35)
AST: 14 U/L (ref 0–37)
Albumin: 4.3 g/dL (ref 3.5–5.2)
Alkaline Phosphatase: 82 U/L (ref 39–117)
BUN: 18 mg/dL (ref 6–23)
CO2: 27 mEq/L (ref 19–32)
Calcium: 9.4 mg/dL (ref 8.4–10.5)
Chloride: 100 mEq/L (ref 96–112)
Creatinine, Ser: 0.87 mg/dL (ref 0.40–1.20)
GFR: 68.03 mL/min (ref >60.00)
Glucose, Bld: 204 mg/dL — ABNORMAL HIGH (ref 70–99)
Potassium: 4 mEq/L (ref 3.5–5.1)
Sodium: 135 mEq/L (ref 135–145)
Total Bilirubin: 0.3 mg/dL (ref 0.2–1.2)
Total Protein: 7.4 g/dL (ref 6.0–8.3)

## 2015-05-08 LAB — CBC WITH DIFFERENTIAL/PLATELET
Basophils Absolute: 0 10*3/uL (ref 0.0–0.1)
Basophils Relative: 0.3 % (ref 0.0–3.0)
Eosinophils Absolute: 0 10*3/uL (ref 0.0–0.7)
Eosinophils Relative: 0 % (ref 0.0–5.0)
HCT: 39.6 % (ref 36.0–46.0)
Hemoglobin: 13.2 g/dL (ref 12.0–15.0)
Lymphocytes Relative: 39.6 % (ref 12.0–46.0)
Lymphs Abs: 3.7 10*3/uL (ref 0.7–4.0)
MCHC: 33.4 g/dL (ref 30.0–36.0)
MCV: 91.5 fl (ref 78.0–100.0)
Monocytes Absolute: 0.7 10*3/uL (ref 0.1–1.0)
Monocytes Relative: 7.1 % (ref 3.0–12.0)
Neutro Abs: 4.9 10*3/uL (ref 1.4–7.7)
Neutrophils Relative %: 53 % (ref 43.0–77.0)
Platelets: 231 10*3/uL (ref 150.0–400.0)
RBC: 4.33 Mil/uL (ref 3.87–5.11)
RDW: 13.3 % (ref 11.5–15.5)
WBC: 9.3 10*3/uL (ref 4.0–10.5)

## 2015-05-08 LAB — HEMOGLOBIN A1C: Hgb A1c MFr Bld: 8.2 % — ABNORMAL HIGH (ref 4.6–6.5)

## 2015-05-08 LAB — LIPID PANEL
Cholesterol: 156 mg/dL (ref 0–200)
HDL: 31.8 mg/dL — ABNORMAL LOW (ref >39.00)
Total CHOL/HDL Ratio: 5
Triglycerides: 543 mg/dL — ABNORMAL HIGH (ref 0.0–149.0)

## 2015-05-08 LAB — HM DIABETES FOOT EXAM

## 2015-05-08 LAB — LDL CHOLESTEROL, DIRECT: Direct LDL: 68 mg/dL

## 2015-05-08 LAB — T4, FREE: Free T4: 0.86 ng/dL (ref 0.60–1.60)

## 2015-05-08 MED ORDER — ZOSTER VACCINE LIVE 19400 UNT/0.65ML ~~LOC~~ SOLR: 0.6500 mL | Freq: Once | SUBCUTANEOUS | Status: DC

## 2015-05-08 NOTE — Assessment & Plan Note (Signed)
Ongoing anxiety and dysthymia Fairly stable on the amitriptyline (for neuropathy also)

## 2015-05-08 NOTE — Assessment & Plan Note (Signed)
Really needs to get back with Dr Cruzita Lederer

## 2015-05-08 NOTE — Assessment & Plan Note (Signed)
I have personally reviewed the Medicare Annual Wellness questionnaire and have noted 1. The patient's medical and social history 2. Their use of alcohol, tobacco or illicit drugs 3. Their current medications and supplements 4. The patient's functional ability including ADL's, fall risks, home safety risks and hearing or visual             impairment. 5. Diet and physical activities 6. Evidence for depression or mood disorders  The patients weight, height, BMI and visual acuity have been recorded in the chart I have made referrals, counseling and provided education to the patient based review of the above and I have provided the pt with a written personalized care plan for preventive services.  I have provided you with a copy of your personalized plan for preventive services. Please take the time to review along with your updated medication list.  Doesn't want mammogram Will do fecal immunoassay Rx again for zosatavax

## 2015-05-08 NOTE — Assessment & Plan Note (Signed)
BP Readings from Last 3 Encounters:  05/08/15 130/80  03/25/15 138/66  02/09/15 127/63   Good control

## 2015-05-08 NOTE — Progress Notes (Signed)
Pre visit review using our clinic review tool, if applicable. No additional management support is needed unless otherwise documented below in the visit note. 

## 2015-05-08 NOTE — Assessment & Plan Note (Signed)
Left over aggressive narcotic regimen Will wean her to #220 per month now

## 2015-05-08 NOTE — Patient Instructions (Signed)
I will be decreasing your oxycodone to 220 tabs a month. Please decrease how many you take daily. For your bowels, take miralax (polyethylene glycol) 1 capful in water daily. If this doesn't work after 1 week or so, add senna-s 2 tablets daily.

## 2015-05-08 NOTE — Progress Notes (Signed)
Subjective:    Patient ID: Burley Saver, female    DOB: [birth year], 74 y.o.   MRN: [id number]  HPI Here for Medicare wellness and follow up of chronic medical conditions Reviewed form and advanced directives Reviewed other doctors---recent angioplasty so with cardiologist. Hasn't seen Dr Katrina Stack to go back Granddaughter Nira Conn (who she raised) is with her No alcohol or tobacco Tries to walk some Vision is poor---has exam next month Hearing is okay--mild problems at times working as Scientist, water quality (part time) No falls Chronic mood issues Still driving. Independent with instrumental ADLs but trouble getting in tub due to pain Mild memory issues per granddaughter  No current chest pain Did have one episode of chest pain since the angioplasty--didn't have to take NTG No problems since Will walk occasionally--- legs hurt but no chest pain. Some DOE but legs hurt first  Some anxiety attacks--will shake and have palpitations Usually in response to something Some depressed mood--usually brief and frequent (if thinking about her husband) Not anhedonic  Feels her pain is mostly due to knee OA Some element of neuropathy as well  Checks sugars twice a day usually Will occasionally adjust insulin Usually in 200's randomly--occasionally less  Current Outpatient Prescriptions on File Prior to Visit  Medication Sig Dispense Refill  . amitriptyline (ELAVIL) 150 MG tablet Take 1 tablet (150 mg total) by mouth at bedtime. 90 tablet 3  . aspirin EC 81 MG EC tablet Take 1 tablet (81 mg total) by mouth daily.    Marland Kitchen atorvastatin (LIPITOR) 80 MG tablet Take 0.5 tablets (40 mg total) by mouth daily. 90 tablet 3  . B-D UF III MINI PEN NEEDLES 31G X 5 MM MISC USE AS DIRECTED WITH LANTUS SOLOSTAR 100 each 11  . carvedilol (COREG) 12.5 MG tablet Take 1 tablet (12.5 mg total) by mouth 2 (two) times daily with a meal. 60 tablet 5  . clopidogrel (PLAVIX) 75 MG tablet Take 1 tablet (75 mg total) by  mouth daily. 40 tablet 11  . insulin regular (NOVOLIN R RELION) 100 units/mL injection Inject 0.08-0.1 mLs (8-10 Units total) into the skin 3 (three) times daily before meals. 10 mL 2  . Insulin Syringe-Needle U-100 (RELION INSULIN SYR 0.5ML/31G) 31G X 5/16" 0.5 ML MISC Use 3x a day 200 each 6  . losartan (COZAAR) 100 MG tablet TAKE 1 TABLET BY MOUTH DAILY 90 tablet 1  . metFORMIN (GLUCOPHAGE) 1000 MG tablet Take 1 tablet (1,000 mg total) by mouth 2 (two) times daily with a meal. 60 tablet 5  . nabumetone (RELAFEN) 500 MG tablet Take 1 tablet (500 mg total) by mouth daily. 20 tablet 0  . nitroGLYCERIN (NITROSTAT) 0.4 MG SL tablet Place 1 tablet (0.4 mg total) under the tongue every 5 (five) minutes as needed for chest pain. 30 tablet 12  . oxyCODONE-acetaminophen (PERCOCET) 10-325 MG tablet Take 1-2 tablets by mouth 4 (four) times daily as needed for pain. 240 tablet 0   No current facility-administered medications on file prior to visit.    No Known Allergies  Past Medical History  Diagnosis Date  . Osteoarthritis of spine     knees also  . Dyslipidemia   . Hypertension   . Diabetes mellitus, type 2 ([place])   . Episodic mood disorder ([place])   . CAD (coronary artery disease)     a. Remote nonobstructive disease but in 10/2012 Cath/PCI: s/p DES to RCA. b. cath 02/08/15 DES to prox LAD and balloon angioplasty of ost D1,  EF 55-65%.  . Obesity     Past Surgical History  Procedure Laterality Date  . Gallbladder surgery  1968  . Appendectomy  1959  . Combined hysterectomy abdominal w/ a&p repair / oophorectomy  1968  . Multiple d&c    . Tonsillectomy  age 55  . Coronary stent placement  2014  . Left heart catheterization with coronary angiogram N/A 10/20/2012    Procedure: LEFT HEART CATHETERIZATION WITH CORONARY ANGIOGRAM;  Surgeon: Sherren Mocha, MD;  Location: [facility] CATH LAB;  Service: Cardiovascular;  Laterality: N/A;  . Cholecystectomy    . Cardiac catheterization N/A 02/08/2015     Procedure: Left Heart Cath and Coronary Angiography;  Surgeon: Sherren Mocha, MD;  Location: [place] CV LAB;  Service: Cardiovascular;  Laterality: N/A;  . Cardiac catheterization N/A 02/08/2015    Procedure: Coronary Stent Intervention;  Surgeon: Sherren Mocha, MD;  Location: [place] CV LAB;  Service: Cardiovascular;  Laterality: N/A;  . Cardiac catheterization N/A 02/08/2015    Procedure: Intravascular Pressure Wire/FFR Study;  Surgeon: Sherren Mocha, MD;  Location: [place] CV LAB;  Service: Cardiovascular;  Laterality: N/A;    Family History  Problem Relation Age of Onset  . Cancer Brother     Colon  . Coronary artery disease Neg Hx   . Heart attack Neg Hx     Social History   Social History  . Marital Status: Widowed    Spouse Name: N/A  . Number of Children: 3  . Years of Education: 12   Occupational History  . CASHIER    Social History Main Topics  . Smoking status: Never Smoker   . Smokeless tobacco: Never Used  . Alcohol Use: No  . Drug Use: No  . Sexual Activity: No   Other Topics Concern  . Not on file   Social History Narrative   Widowed '13. 2 sons- '63, '66; one daughter-'69;    17 grandchildren--has raised 9 of her grandchildren 4 great grandchildren.   Lives with 2 granddaughters and adoptive son      Has living will   Probably would have son Jasline Castellini as health care POA   Would accept resuscitation but no prolonged ventilation   Not sure about tube feeds   Review of Systems Appetite is okay Weight is down 10# in the past year Sleeps well with the amitriptyline Wears seat belt Full dentures Bowels are slow--uses softener without good results. Discussed a better regimen. Voids okay Some arthritis in hands, etc also---but mostly knees Rare headaches    Objective:   Physical Exam  Constitutional: She is oriented to person, place, and time. She appears well-developed and well-nourished. No distress.  HENT:  Mouth/Throat:  Oropharynx is clear and moist. No oropharyngeal exudate.  Neck: Normal range of motion. Neck supple. No thyromegaly present.  Cardiovascular: Normal rate, regular rhythm and normal heart sounds.  Exam reveals no gallop.   No murmur heard. Feet warm with faint pulses  Pulmonary/Chest: Effort normal and breath sounds normal. No respiratory distress. She has no wheezes. She has no rales.  Abdominal: Soft. There is no tenderness.  Musculoskeletal: She exhibits no edema.  Mild crepitus in both knees  Lymphadenopathy:    She has no cervical adenopathy.  Neurological: She is alert and oriented to person, place, and time.  President-- "Obama, ?" [phone]? D-l-r-o-w Recall 1/3  HS graduate  No sensation in feet--          Assessment & Plan:

## 2015-05-08 NOTE — Assessment & Plan Note (Signed)
See social history Blank forms done 

## 2015-05-08 NOTE — Assessment & Plan Note (Signed)
1 vague episode of chest pain since angioplasty On plavix instead of brilinta due to cost

## 2015-05-09 ENCOUNTER — Encounter: Payer: Self-pay | Admitting: *Deleted

## 2015-05-14 ENCOUNTER — Other Ambulatory Visit: Payer: Self-pay

## 2015-05-14 MED ORDER — OXYCODONE-ACETAMINOPHEN 10-325 MG PO TABS: 1.0000 | ORAL_TABLET | Freq: Four times a day (QID) | ORAL | Status: DC | PRN

## 2015-05-14 NOTE — Telephone Encounter (Signed)
Spoke with patient and advised rx ready for pick-up and it will be at the front desk.  

## 2015-05-14 NOTE — Telephone Encounter (Signed)
Quantity reduced as discussed at last OV

## 2015-05-14 NOTE — Telephone Encounter (Signed)
Pt left v/m requesting oxycodone apap. Call when ready for pick up. Last printed # 240 on 04/08/15. Last annual exam 05/08/15.

## 2015-05-15 ENCOUNTER — Encounter: Payer: Self-pay | Admitting: *Deleted

## 2015-05-15 ENCOUNTER — Other Ambulatory Visit: Payer: PPO

## 2015-05-15 ENCOUNTER — Other Ambulatory Visit (INDEPENDENT_AMBULATORY_CARE_PROVIDER_SITE_OTHER): Payer: PPO

## 2015-05-15 DIAGNOSIS — Z1211 Encounter for screening for malignant neoplasm of colon: Secondary | ICD-10-CM | POA: Diagnosis not present

## 2015-05-15 LAB — FECAL OCCULT BLOOD, IMMUNOCHEMICAL: Fecal Occult Bld: NEGATIVE

## 2015-06-10 NOTE — Progress Notes (Signed)
Patient ID: Cynthia Singh, female   DOB: Oct 06, 1940, 75 y.o.   MRN: [id number]     Cardiology Office Note   Date:  06/10/2015   ID:  Cynthia Singh, DOB February 22, 1941, MRN [id number]  PCP:  [name], MD  Cardiologist:  Dr Oswaldo Conroy, MD   No chief complaint on file.   History of Present Illness: Cynthia Singh is a 75 y.o. female with a history of RCA stent in 2014, hypertension, hyperlipidemia, and uncontrolled IDDM. She had a DES to the LAD 02/08/2015.      She has occasional chest pain, but she doesn't think it is cardiac. She has no consistent chest pain with exertion. She has not had lower extremity edema. Her cath sites have healed in well. She has occasional dyspnea on exertion, but feels that she is doing better and improving. She is willing to do cardiac rehabilitation.  Last visit with PA Brillinta changed due to inability to afford and noncompliance Reloaded with Plavix  Past Medical History  Diagnosis Date  . Osteoarthritis of spine     knees also  . Dyslipidemia   . Hypertension   . Diabetes mellitus, type 2 ([place])   . Episodic mood disorder ([place])   . CAD (coronary artery disease)     a. Remote nonobstructive disease but in 10/2012 Cath/PCI: s/p DES to RCA. b. cath 02/08/15 DES to prox LAD and balloon angioplasty of ost D1, EF 55-65%.  . Obesity     Past Surgical History  Procedure Laterality Date  . Gallbladder surgery  1968  . Appendectomy  1959  . Combined hysterectomy abdominal w/ a&p repair / oophorectomy  1968  . Multiple d&c    . Tonsillectomy  age 77  . Coronary stent placement  2014  . Left heart catheterization with coronary angiogram N/A 10/20/2012    Procedure: LEFT HEART CATHETERIZATION WITH CORONARY ANGIOGRAM;  Surgeon: Sherren Mocha, MD;  [facility] CATH LAB;  Service: Cardiovascular;  Laterality: N/A;  . Cholecystectomy    . Cardiac catheterization N/A 02/08/2015    Procedure: Left Heart Cath and Coronary Angiography;   Surgeon: Sherren Mocha, MD;  Location: [place] CV LAB;  Service: Cardiovascular;  Laterality: N/A;  . Cardiac catheterization N/A 02/08/2015    Procedure: Coronary Stent Intervention;  Surgeon: Sherren Mocha, MD;  Location: [place] CV LAB;  Service: Cardiovascular;  Laterality: N/A;  . Cardiac catheterization N/A 02/08/2015    Procedure: Intravascular Pressure Wire/FFR Study;  Surgeon: Sherren Mocha, MD;  Location: [place] CV LAB;  Service: Cardiovascular;  Laterality: N/A;    Current Outpatient Prescriptions  Medication Sig Dispense Refill  . amitriptyline (ELAVIL) 150 MG tablet Take 1 tablet (150 mg total) by mouth at bedtime. 90 tablet 3  . aspirin EC 81 MG EC tablet Take 1 tablet (81 mg total) by mouth daily.    Marland Kitchen atorvastatin (LIPITOR) 80 MG tablet Take 0.5 tablets (40 mg total) by mouth daily. 90 tablet 3  . B-D UF III MINI PEN NEEDLES 31G X 5 MM MISC USE AS DIRECTED WITH LANTUS SOLOSTAR 100 each 11  . carvedilol (COREG) 12.5 MG tablet Take 1 tablet (12.5 mg total) by mouth 2 (two) times daily with a meal. 60 tablet 5  . furosemide (LASIX) 40 MG tablet Take 1 tablet (40 mg total) by mouth daily. 30 tablet 3  . insulin NPH Human (NOVOLIN N RELION) 100 UNIT/ML injection Inject 40 units a day as advised (Patient taking differently:  Inject 40 Units into the skin daily before breakfast. Inject 40 units a day as advised) 20 mL 2  . insulin regular (NOVOLIN R RELION) 100 units/mL injection Inject 0.08-0.1 mLs (8-10 Units total) into the skin 3 (three) times daily before meals. (Patient not taking: Reported on 02/07/2015) 10 mL 2  . Insulin Syringe-Needle U-100 (RELION INSULIN SYR 0.5ML/31G) 31G X 5/16" 0.5 ML MISC Use 3x a day 200 each 6  . losartan (COZAAR) 100 MG tablet TAKE 1 TABLET BY MOUTH DAILY 90 tablet 1  . metFORMIN (GLUCOPHAGE) 1000 MG tablet Take 1 tablet (1,000 mg total) by mouth 2 (two) times daily with a meal. 60 tablet 5  . nabumetone (RELAFEN) 500 MG tablet Take 1  tablet (500 mg total) by mouth daily. 20 tablet 0  . nitroGLYCERIN (NITROSTAT) 0.4 MG SL tablet Place 1 tablet (0.4 mg total) under the tongue every 5 (five) minutes as needed for chest pain. 30 tablet 12  . oxyCODONE-acetaminophen (PERCOCET) 10-325 MG tablet Take 1-2 tablets by mouth 4 (four) times daily as needed for pain. 240 tablet 0  . ticagrelor (BRILINTA) 90 MG TABS tablet Take 1 tablet (90 mg total) by mouth 2 (two) times daily. 180 tablet 3  . zoster vaccine live, PF, (ZOSTAVAX) 29562 UNT/0.65ML injection Inject 19,400 Units into the skin once. 1 each 0   No current facility-administered medications for this visit.    Allergies:   Review of patient's allergies indicates no known allergies.    Social History:  The patient  reports that she has never smoked. She has never used smokeless tobacco. She reports that she does not drink alcohol or use illicit drugs.   Family History:  The patient's family history includes Cancer in her brother. There is no history of Coronary artery disease or Heart attack.    ROS:  Please see the history of present illness. All other systems are reviewed and negative.    PHYSICAL EXAM: VS:  There were no vitals taken for this visit. , BMI There is no weight on file to calculate BMI. GEN: Well nourished, well developed, female in no acute distress HEENT: normal for age  Neck: no JVD, no carotid bruit, no masses Cardiac: RRR; soft murmur, no rubs, or gallops Respiratory:  clear to auscultation bilaterally, normal work of breathing, patient inspirations are generally not very deep, but are clear and not labored GI: soft, nontender, nondistended, + BS MS: no deformity or atrophy; no edema; distal pulses are 2+ in all 4 extremities  Skin: warm and dry, no rash Neuro:  Strength and sensation are intact Psych: euthymic mood, full affect   EKG:   03/2015  sinus rhythm, no acute ischemic changes ? Old IMI poor R wave progression    Recent  Labs: 05/08/2015: ALT 14; BUN 18; Creatinine, Ser 0.87; Hemoglobin 13.2; Platelets 231.0; Potassium 4.0; Sodium 135    Lipid Panel    Component Value Date/Time   CHOL 156 05/08/2015 1125   TRIG * 05/08/2015 1125    543.0 Triglyceride is over 400; calculations on Lipids are invalid.   HDL 31.80* 05/08/2015 1125   CHOLHDL 5 05/08/2015 1125   VLDL UNABLE TO CALCULATE IF TRIGLYCERIDE OVER 400 mg/dL 02/08/2015 0042   LDLCALC UNABLE TO CALCULATE IF TRIGLYCERIDE OVER 400 mg/dL 02/08/2015 0042   LDLDIRECT 68.0 05/08/2015 1125     Wt Readings from Last 3 Encounters:  05/08/15 87.998 kg (194 lb)  03/25/15 88.542 kg (195 lb 3.2 oz)  02/09/15 87.952  kg (193 lb 14.4 oz)     Other studies Reviewed: Additional studies/ records that were reviewed today include: Hospital records, previous office notes, cath films.  ASSESSMENT AND PLAN:  1.  CAD: She had a drug-eluting stent the LAD 02/08/2015.   2. Hyperlipidemia:   Compliance with her medications in general is reinforced. If she has future problems with her medication she is to contact us prior to running out.   Current medicines are reviewed at length with the patient today.  The patient does not have concerns regarding medicines.  The following changes have been made:  DC Brilinta, add Plavix  Labs/ tests ordered today include:   No orders of the defined types were placed in this encounter.     Disposition:   FU with    Signed, Jenkins Rouge, MD  06/10/2015 8:01 AM    [facility] [address], [place], [place]  [address] Phone: [phone]; Fax: [phone]

## 2015-06-11 ENCOUNTER — Telehealth (HOSPITAL_COMMUNITY): Payer: Self-pay | Admitting: Cardiac Rehabilitation

## 2015-06-11 ENCOUNTER — Encounter: Payer: Self-pay | Admitting: Cardiovascular Disease

## 2015-06-11 NOTE — Telephone Encounter (Signed)
Three telephone attempts to contact pt to enroll in cardiac rehab.  Letter mailed to pt 03/29/15 without response.

## 2015-06-13 ENCOUNTER — Other Ambulatory Visit: Payer: Self-pay

## 2015-06-13 ENCOUNTER — Encounter: Payer: PPO | Admitting: Cardiovascular Disease

## 2015-06-13 MED ORDER — OXYCODONE-ACETAMINOPHEN 10-325 MG PO TABS: 1.0000 | ORAL_TABLET | Freq: Four times a day (QID) | ORAL | Status: DC | PRN

## 2015-06-13 NOTE — Telephone Encounter (Signed)
Pt left v/m requesting rx oxycodone apap. Call when ready for pick up. rx last printed # 220 on 05/14/15. Last seen 05/08/15 for annual exam.

## 2015-06-13 NOTE — Telephone Encounter (Signed)
Spoke with patient and advised rx ready for pick-up and it will be at the front desk.  

## 2015-06-18 ENCOUNTER — Encounter: Payer: Self-pay | Admitting: Cardiovascular Disease

## 2015-06-20 ENCOUNTER — Telehealth: Payer: Self-pay | Admitting: *Deleted

## 2015-06-20 NOTE — Telephone Encounter (Signed)
Let her know we already went down to #220 I don't think we can give a second prescription and have it be cash payment I was going to have her try to go down to 200 in the next few months anyway so I think she should try

## 2015-06-20 NOTE — Telephone Encounter (Signed)
Received letter from [facility] stating that pt will only be allowed to get a quantity of #180 per month of percocet 10/325 mg instead of the 240 that she's getting now. Per pt she wanted to know if she can pay cash price for the remaining 60 that she will loose? Please advise

## 2015-06-21 NOTE — Telephone Encounter (Signed)
Tried calling cell number, and VM is full, will try again later.

## 2015-06-22 ENCOUNTER — Other Ambulatory Visit: Payer: Self-pay | Admitting: Internal Medicine

## 2015-06-24 NOTE — Telephone Encounter (Signed)
Spoke with daughter, pt wasn't at home, I asked daughter to have pt return my call.

## 2015-06-24 NOTE — Telephone Encounter (Signed)
Pt returned my call and I advised that she will need to only get #180 and Dr.Letvak's comments. Pt said ok and hung up.

## 2015-07-12 ENCOUNTER — Other Ambulatory Visit: Payer: Self-pay

## 2015-07-12 MED ORDER — OXYCODONE-ACETAMINOPHEN 10-325 MG PO TABS: 1.0000 | ORAL_TABLET | Freq: Four times a day (QID) | ORAL | Status: DC | PRN

## 2015-07-12 NOTE — Telephone Encounter (Signed)
Tried to call patient to advise prescription was ready for pickup. Her voice mail is full. I could not leave a meassage. Prescription has been sent to the front.

## 2015-07-12 NOTE — Telephone Encounter (Signed)
Pt left v/m requesting rx oxycodone apap. Call when ready for pick up. Pt is going on cruise 07/15/15 and wants to pick up rx today. rx last printed # 220 on 06/13/15 and last annual exam on 05/08/15.

## 2015-07-15 ENCOUNTER — Other Ambulatory Visit: Payer: Self-pay | Admitting: Internal Medicine

## 2015-08-09 ENCOUNTER — Other Ambulatory Visit: Payer: Self-pay

## 2015-08-09 NOTE — Telephone Encounter (Signed)
Pt left v/m requesting rx oxycodone apap. Call when ready for pick up. rx last printed # 180 on 07/12/15 and pt seen 05/08/15.

## 2015-08-10 NOTE — Telephone Encounter (Signed)
Approved:okay to prepare #180 x 0

## 2015-08-12 MED ORDER — OXYCODONE-ACETAMINOPHEN 10-325 MG PO TABS: 1.0000 | ORAL_TABLET | Freq: Four times a day (QID) | ORAL | Status: DC | PRN

## 2015-08-12 NOTE — Telephone Encounter (Signed)
Rx printed and ready for signature.  

## 2015-08-12 NOTE — Telephone Encounter (Signed)
RX signed and patient aware rx is up front for pickup

## 2015-09-06 ENCOUNTER — Ambulatory Visit (INDEPENDENT_AMBULATORY_CARE_PROVIDER_SITE_OTHER): Payer: PPO | Admitting: Internal Medicine

## 2015-09-06 ENCOUNTER — Encounter: Payer: Self-pay | Admitting: Internal Medicine

## 2015-09-06 VITALS — BP 150/100 | HR 91 | Temp 97.6°F | Wt 195.0 lb

## 2015-09-06 DIAGNOSIS — M25569 Pain in unspecified knee: Secondary | ICD-10-CM | POA: Diagnosis not present

## 2015-09-06 MED ORDER — OXYCODONE-ACETAMINOPHEN 10-325 MG PO TABS: 1.0000 | ORAL_TABLET | Freq: Three times a day (TID) | ORAL | Status: DC

## 2015-09-06 MED ORDER — OXYCODONE-ACETAMINOPHEN 10-325 MG PO TABS: 2.0000 | ORAL_TABLET | Freq: Three times a day (TID) | ORAL | Status: DC

## 2015-09-06 NOTE — Progress Notes (Signed)
Pre visit review using our clinic review tool, if applicable. No additional management support is needed unless otherwise documented below in the visit note. 

## 2015-09-06 NOTE — Progress Notes (Signed)
Subjective:    Patient ID: Cynthia Singh, female    DOB: 1940/08/11, 75 y.o.   MRN: [id number]  HPI Here with daughter to discuss pain regimen Ran out of her percocet and now having withdrawls Didn't cut down on dose so ran out  Goes to work daily The meds help her get to work and maintain Feels she has worked out a system with boss--she feels she can get by on the lower dose Finds that 1 tab doesn't help Is adjusting schedule to take the 2 tabs only tid  Current Outpatient Prescriptions on File Prior to Visit  Medication Sig Dispense Refill  . amitriptyline (ELAVIL) 150 MG tablet TAKE ONE TABLET BY MOUTH EVERY NIGHT AT BEDTIME 90 tablet 0  . aspirin EC 81 MG EC tablet Take 1 tablet (81 mg total) by mouth daily.    Marland Kitchen atorvastatin (LIPITOR) 80 MG tablet Take 0.5 tablets (40 mg total) by mouth daily. 90 tablet 3  . carvedilol (COREG) 12.5 MG tablet Take 1 tablet (12.5 mg total) by mouth 2 (two) times daily with a meal. 60 tablet 5  . clopidogrel (PLAVIX) 75 MG tablet Take 1 tablet (75 mg total) by mouth daily. 40 tablet 11  . insulin NPH Human (HUMULIN N,NOVOLIN N) 100 UNIT/ML injection Inject 30 Units into the skin daily before breakfast.    . insulin regular (NOVOLIN R RELION) 100 units/mL injection Inject 0.08-0.1 mLs (8-10 Units total) into the skin 3 (three) times daily before meals. 10 mL 2  . losartan (COZAAR) 100 MG tablet TAKE 1 TABLET BY MOUTH DAILY 90 tablet 0  . metFORMIN (GLUCOPHAGE) 1000 MG tablet Take 1 tablet (1,000 mg total) by mouth 2 (two) times daily with a meal. 60 tablet 5  . nabumetone (RELAFEN) 500 MG tablet Take 1 tablet (500 mg total) by mouth daily. 20 tablet 0  . nitroGLYCERIN (NITROSTAT) 0.4 MG SL tablet Place 1 tablet (0.4 mg total) under the tongue every 5 (five) minutes as needed for chest pain. 30 tablet 12  . oxyCODONE-acetaminophen (PERCOCET) 10-325 MG tablet Take 1-2 tablets by mouth 4 (four) times daily as needed for pain. 180 tablet 0  .  polyethylene glycol (MIRALAX / GLYCOLAX) packet Take 17 g by mouth daily.    . sennosides-docusate sodium (SENOKOT-S) 8.6-50 MG tablet Take 2 tablets by mouth daily as needed for constipation.     No current facility-administered medications on file prior to visit.    No Known Allergies  Past Medical History  Diagnosis Date  . Osteoarthritis of spine     knees also  . Dyslipidemia   . Hypertension   . Diabetes mellitus, type 2 ([place])   . Episodic mood disorder ([place])   . CAD (coronary artery disease)     a. Remote nonobstructive disease but in 10/2012 Cath/PCI: s/p DES to RCA. b. cath 02/08/15 DES to prox LAD and balloon angioplasty of ost D1, EF 55-65%.  . Obesity     Past Surgical History  Procedure Laterality Date  . Gallbladder surgery  1968  . Appendectomy  1959  . Combined hysterectomy abdominal w/ a&p repair / oophorectomy  1968  . Multiple d&c    . Tonsillectomy  age 56  . Coronary stent placement  2014  . Left heart catheterization with coronary angiogram N/A 10/20/2012    Procedure: LEFT HEART CATHETERIZATION WITH CORONARY ANGIOGRAM;  Surgeon: Sherren Mocha, MD;  Location: [facility] CATH LAB;  Service: Cardiovascular;  Laterality: N/A;  . Cholecystectomy    .  Cardiac catheterization N/A 02/08/2015    Procedure: Left Heart Cath and Coronary Angiography;  Surgeon: Sherren Mocha, MD;  Location: [place] CV LAB;  Service: Cardiovascular;  Laterality: N/A;  . Cardiac catheterization N/A 02/08/2015    Procedure: Coronary Stent Intervention;  Surgeon: Sherren Mocha, MD;  Location: [place] CV LAB;  Service: Cardiovascular;  Laterality: N/A;  . Cardiac catheterization N/A 02/08/2015    Procedure: Intravascular Pressure Wire/FFR Study;  Surgeon: Sherren Mocha, MD;  Location: [place] CV LAB;  Service: Cardiovascular;  Laterality: N/A;    Family History  Problem Relation Age of Onset  . Cancer Brother     Colon  . Coronary artery disease Neg Hx   . Heart attack Neg Hx      Social History   Social History  . Marital Status: Widowed    Spouse Name: N/A  . Number of Children: 3  . Years of Education: 12   Occupational History  . CASHIER    Social History Main Topics  . Smoking status: Never Smoker   . Smokeless tobacco: Never Used  . Alcohol Use: No  . Drug Use: No  . Sexual Activity: No   Other Topics Concern  . Not on file   Social History Narrative   Widowed '13. 2 sons- '63, '66; one daughter-'69;    6 grandchildren--has raised 9 of her grandchildren 4 great grandchildren.   Lives with 2 granddaughters and adoptive son      Has living will   Probably would have son Kelvin Bustillos as health care POA   Would accept resuscitation but no prolonged ventilation   Not sure about tube feeds   Review of Systems Sleeps well with amitriptyline BP up due to being out of meds Reviewed her improved diabetes    Objective:   Physical Exam        Assessment & Plan:

## 2015-09-06 NOTE — Assessment & Plan Note (Addendum)
Needs the percocet to be able to work  Used up her month's supply 6 days early---now has worked out schedule to use reduced dosage Checked state website---no other Rx given other than mine Will give supply till [employer] refill early

## 2015-09-27 ENCOUNTER — Other Ambulatory Visit: Payer: Self-pay | Admitting: Internal Medicine

## 2015-10-08 ENCOUNTER — Other Ambulatory Visit: Payer: Self-pay

## 2015-10-08 MED ORDER — OXYCODONE-ACETAMINOPHEN 10-325 MG PO TABS: 1.0000 | ORAL_TABLET | Freq: Three times a day (TID) | ORAL | Status: DC

## 2015-10-08 NOTE — Telephone Encounter (Signed)
Spoke to pt. Advised the rx was up front for pickup

## 2015-10-08 NOTE — Telephone Encounter (Signed)
Pt left v/m requesting rx percocet. Call when ready for pick up. Pt last seen and rx last printed for # 20 and # 180 on 09/06/15.Please advise.

## 2015-10-26 ENCOUNTER — Other Ambulatory Visit: Payer: Self-pay | Admitting: Internal Medicine

## 2015-11-07 ENCOUNTER — Other Ambulatory Visit: Payer: Self-pay

## 2015-11-07 ENCOUNTER — Ambulatory Visit: Payer: PPO | Admitting: Internal Medicine

## 2015-11-07 MED ORDER — OXYCODONE-ACETAMINOPHEN 10-325 MG PO TABS: 1.0000 | ORAL_TABLET | Freq: Four times a day (QID) | ORAL | Status: DC | PRN

## 2015-11-07 NOTE — Telephone Encounter (Signed)
Spoke to pt. Rx up front ready for pickup 

## 2015-11-07 NOTE — Telephone Encounter (Signed)
Pt left v/m requesting rx oxycodone apap. Call when ready for pick up; pts daughter passed away and pt going out of town 11/08/15. Last printed #180 on 10/08/15. Pt last seen 09/06/15.

## 2015-11-12 ENCOUNTER — Ambulatory Visit: Payer: PPO | Admitting: Internal Medicine

## 2015-11-14 ENCOUNTER — Other Ambulatory Visit: Payer: Self-pay | Admitting: Internal Medicine

## 2015-12-06 ENCOUNTER — Encounter: Payer: Self-pay | Admitting: Internal Medicine

## 2015-12-06 ENCOUNTER — Ambulatory Visit (INDEPENDENT_AMBULATORY_CARE_PROVIDER_SITE_OTHER): Payer: PPO | Admitting: Internal Medicine

## 2015-12-06 VITALS — BP 152/88 | HR 70 | Temp 97.7°F | Wt 184.0 lb

## 2015-12-06 DIAGNOSIS — I10 Essential (primary) hypertension: Secondary | ICD-10-CM

## 2015-12-06 DIAGNOSIS — IMO0002 Reserved for concepts with insufficient information to code with codable children: Secondary | ICD-10-CM

## 2015-12-06 DIAGNOSIS — M4715 Other spondylosis with myelopathy, thoracolumbar region: Secondary | ICD-10-CM | POA: Diagnosis not present

## 2015-12-06 DIAGNOSIS — F39 Unspecified mood [affective] disorder: Secondary | ICD-10-CM | POA: Diagnosis not present

## 2015-12-06 DIAGNOSIS — E1140 Type 2 diabetes mellitus with diabetic neuropathy, unspecified: Secondary | ICD-10-CM

## 2015-12-06 DIAGNOSIS — E1165 Type 2 diabetes mellitus with hyperglycemia: Secondary | ICD-10-CM

## 2015-12-06 MED ORDER — LOSARTAN POTASSIUM-HCTZ 100-12.5 MG PO TABS: 1.0000 | ORAL_TABLET | Freq: Every day | ORAL | Status: DC

## 2015-12-06 MED ORDER — OXYCODONE-ACETAMINOPHEN 10-325 MG PO TABS: 1.0000 | ORAL_TABLET | Freq: Four times a day (QID) | ORAL | Status: DC | PRN

## 2015-12-06 NOTE — Assessment & Plan Note (Signed)
BP Readings from Last 3 Encounters:  12/06/15 152/88  09/06/15 150/100  05/08/15 130/80   Persistent elevation Will add HCTZ to the losartan

## 2015-12-06 NOTE — Assessment & Plan Note (Signed)
May be better with weight loss Will recheck A1c with labs in a month

## 2015-12-06 NOTE — Assessment & Plan Note (Signed)
Related to pain Continues on the amitriptyline

## 2015-12-06 NOTE — Assessment & Plan Note (Signed)
And leg pain Continues on the percocet CSRS checked

## 2015-12-06 NOTE — Progress Notes (Signed)
Subjective:    Patient ID: Burley Saver, female    DOB: [birth year], 75 y.o.   MRN: [id number]  HPI Here for follow up of diabetes and other chronic health conditions Daughter with her  "not doing too well today---- my legs are hurting" Doesn't have any good days Will vary percocet somewhat--discussed the limit Reviewed CSRS---no prescriptions but mine  Checks sugars once a day 140-150 Has lost 10#---not a real effort but not eating as much No recent eye exam---she is going soon Chronic numbness and pain in feet  No chest pain Gets SOB with the heat No palpitations Still works every morning at [employer] depends on the pain Family note she fires up --yells at times  Current Outpatient Prescriptions on File Prior to Visit  Medication Sig Dispense Refill  . amitriptyline (ELAVIL) 150 MG tablet TAKE ONE TABLET BY MOUTH AT BEDTIME 90 tablet 0  . aspirin EC 81 MG EC tablet Take 1 tablet (81 mg total) by mouth daily.    Marland Kitchen atorvastatin (LIPITOR) 80 MG tablet Take 0.5 tablets (40 mg total) by mouth daily. 90 tablet 3  . carvedilol (COREG) 12.5 MG tablet Take 1 tablet (12.5 mg total) by mouth 2 (two) times daily with a meal. 60 tablet 5  . clopidogrel (PLAVIX) 75 MG tablet Take 1 tablet (75 mg total) by mouth daily. 40 tablet 11  . insulin NPH Human (HUMULIN N,NOVOLIN N) 100 UNIT/ML injection Inject 30 Units into the skin daily before breakfast.    . insulin regular (NOVOLIN R RELION) 100 units/mL injection Inject 0.08-0.1 mLs (8-10 Units total) into the skin 3 (three) times daily before meals. 10 mL 2  . losartan (COZAAR) 100 MG tablet TAKE 1 TABLET BY MOUTH DAILY 90 tablet 0  . metFORMIN (GLUCOPHAGE) 1000 MG tablet TAKE 1 TABLET BY MOUTH TWICE A DAY WITH A MEAL 60 tablet 0  . nabumetone (RELAFEN) 500 MG tablet Take 1 tablet (500 mg total) by mouth daily. 20 tablet 0  . nitroGLYCERIN (NITROSTAT) 0.4 MG SL tablet Place 1 tablet (0.4 mg total) under the tongue every 5  (five) minutes as needed for chest pain. 30 tablet 12  . oxyCODONE-acetaminophen (PERCOCET) 10-325 MG tablet Take 1-2 tablets by mouth every 6 (six) hours as needed for pain. 180 tablet 0  . polyethylene glycol (MIRALAX / GLYCOLAX) packet Take 17 g by mouth daily.    . sennosides-docusate sodium (SENOKOT-S) 8.6-50 MG tablet Take 2 tablets by mouth daily as needed for constipation.     No current facility-administered medications on file prior to visit.    No Known Allergies  Past Medical History  Diagnosis Date  . Osteoarthritis of spine     knees also  . Dyslipidemia   . Hypertension   . Diabetes mellitus, type 2 ([place])   . Episodic mood disorder ([place])   . CAD (coronary artery disease)     a. Remote nonobstructive disease but in 10/2012 Cath/PCI: s/p DES to RCA. b. cath 02/08/15 DES to prox LAD and balloon angioplasty of ost D1, EF 55-65%.  . Obesity     Past Surgical History  Procedure Laterality Date  . Gallbladder surgery  1968  . Appendectomy  1959  . Combined hysterectomy abdominal w/ a&p repair / oophorectomy  1968  . Multiple d&c    . Tonsillectomy  age 11  . Coronary stent placement  2014  . Left heart catheterization with coronary angiogram N/A 10/20/2012    Procedure:  LEFT HEART CATHETERIZATION WITH CORONARY ANGIOGRAM;  Surgeon: Sherren Mocha, MD;  Location: [facility] CATH LAB;  Service: Cardiovascular;  Laterality: N/A;  . Cholecystectomy    . Cardiac catheterization N/A 02/08/2015    Procedure: Left Heart Cath and Coronary Angiography;  Surgeon: Sherren Mocha, MD;  Location: [place] CV LAB;  Service: Cardiovascular;  Laterality: N/A;  . Cardiac catheterization N/A 02/08/2015    Procedure: Coronary Stent Intervention;  Surgeon: Sherren Mocha, MD;  Location: [place] CV LAB;  Service: Cardiovascular;  Laterality: N/A;  . Cardiac catheterization N/A 02/08/2015    Procedure: Intravascular Pressure Wire/FFR Study;  Surgeon: Sherren Mocha, MD;  Location: [place] CV LAB;   Service: Cardiovascular;  Laterality: N/A;    Family History  Problem Relation Age of Onset  . Cancer Brother     Colon  . Coronary artery disease Neg Hx   . Heart attack Neg Hx     Social History   Social History  . Marital Status: Widowed    Spouse Name: N/A  . Number of Children: 3  . Years of Education: 12   Occupational History  . CASHIER    Social History Main Topics  . Smoking status: Never Smoker   . Smokeless tobacco: Never Used  . Alcohol Use: No  . Drug Use: No  . Sexual Activity: No   Other Topics Concern  . Not on file   Social History Narrative   Widowed '13. 2 sons- '63, '66; one daughter-'69;    21 grandchildren--has raised 9 of her grandchildren 4 great grandchildren.   Lives with 2 granddaughters and adoptive son      Has living will   Probably would have son Rylea Rinner as health care POA   Would accept resuscitation but no prolonged ventilation   Not sure about tube feeds   Review of Systems  Sleeps well with the amitriptyline Bowels are slow--does take the miralax and senna     Objective:   Physical Exam  Constitutional: She appears well-developed and well-nourished. No distress.  Neck: Normal range of motion. Neck supple. No thyromegaly present.  Cardiovascular: Normal rate, regular rhythm, normal heart sounds and intact distal pulses.  Exam reveals no gallop.   No murmur heard. Pulmonary/Chest: Effort normal. No respiratory distress. She has no wheezes. She has no rales.  Lymphadenopathy:    She has no cervical adenopathy.  Skin:  No foot lesions  Psychiatric:  Mild depression Normal appearance and speech          Assessment & Plan:

## 2015-12-06 NOTE — Progress Notes (Signed)
Pre visit review using our clinic review tool, if applicable. No additional management support is needed unless otherwise documented below in the visit note. 

## 2015-12-23 ENCOUNTER — Other Ambulatory Visit: Payer: Self-pay | Admitting: Internal Medicine

## 2016-01-06 ENCOUNTER — Other Ambulatory Visit: Payer: PPO

## 2016-01-06 ENCOUNTER — Other Ambulatory Visit: Payer: Self-pay

## 2016-01-06 MED ORDER — OXYCODONE-ACETAMINOPHEN 10-325 MG PO TABS: 1.0000 | ORAL_TABLET | Freq: Four times a day (QID) | ORAL | 0 refills | Status: DC | PRN

## 2016-01-06 NOTE — Telephone Encounter (Signed)
Spoke to pt. Rx up front ready for pickup 

## 2016-01-06 NOTE — Telephone Encounter (Signed)
Pt left v/m requesting rx oxycodone apap. Call when ready for pick up. Last seen and rx last printed # 180 on 12/06/15.

## 2016-01-10 ENCOUNTER — Other Ambulatory Visit (INDEPENDENT_AMBULATORY_CARE_PROVIDER_SITE_OTHER): Payer: PPO

## 2016-01-10 DIAGNOSIS — E1165 Type 2 diabetes mellitus with hyperglycemia: Secondary | ICD-10-CM | POA: Diagnosis not present

## 2016-01-10 DIAGNOSIS — IMO0002 Reserved for concepts with insufficient information to code with codable children: Secondary | ICD-10-CM

## 2016-01-10 DIAGNOSIS — E1140 Type 2 diabetes mellitus with diabetic neuropathy, unspecified: Secondary | ICD-10-CM

## 2016-01-10 LAB — HEMOGLOBIN A1C: Hgb A1c MFr Bld: 8 % — ABNORMAL HIGH (ref 4.6–6.5)

## 2016-01-10 LAB — RENAL FUNCTION PANEL
Albumin: 4.8 g/dL (ref 3.5–5.2)
BUN: 17 mg/dL (ref 6–23)
CO2: 28 mEq/L (ref 19–32)
Calcium: 9.9 mg/dL (ref 8.4–10.5)
Chloride: 97 mEq/L (ref 96–112)
Creatinine, Ser: 1.21 mg/dL — ABNORMAL HIGH (ref 0.40–1.20)
GFR: 46.4 mL/min — ABNORMAL LOW (ref >60.00)
Glucose, Bld: 194 mg/dL — ABNORMAL HIGH (ref 70–99)
Phosphorus: 3.6 mg/dL (ref 2.3–4.6)
Potassium: 4.7 mEq/L (ref 3.5–5.1)
Sodium: 134 mEq/L — ABNORMAL LOW (ref 135–145)

## 2016-01-27 ENCOUNTER — Other Ambulatory Visit: Payer: Self-pay | Admitting: Internal Medicine

## 2016-02-05 ENCOUNTER — Other Ambulatory Visit: Payer: Self-pay

## 2016-02-05 MED ORDER — OXYCODONE-ACETAMINOPHEN 10-325 MG PO TABS: 1.0000 | ORAL_TABLET | Freq: Four times a day (QID) | ORAL | 0 refills | Status: DC | PRN

## 2016-02-05 NOTE — Telephone Encounter (Signed)
Spoke to pt. Rx up front. Placed quick stop letter with rx

## 2016-02-05 NOTE — Telephone Encounter (Signed)
Pt left v/m requesting rx oxycodone apap. Call when ready for pick up. rx last printed # 180 on 01/06/16. Last seen 12/06/15.

## 2016-03-06 ENCOUNTER — Other Ambulatory Visit: Payer: Self-pay

## 2016-03-06 MED ORDER — OXYCODONE-ACETAMINOPHEN 10-325 MG PO TABS: 1.0000 | ORAL_TABLET | Freq: Four times a day (QID) | ORAL | 0 refills | Status: DC | PRN

## 2016-03-06 NOTE — Telephone Encounter (Signed)
Tried to call pt to let her know the rx was up front ready for pickup. Her voice mail is not set up so I could not leave a message

## 2016-03-06 NOTE — Telephone Encounter (Signed)
Pt left v/m requesting rx oxycodone apap. Call when ready for pick up. rx last printed # 180 on 02/05/16; last seen 12/06/15.Please advise.

## 2016-03-06 NOTE — Telephone Encounter (Signed)
Pt left v/m; legs are hurting badly and would like to pick up oxycodone apap rx today.

## 2016-03-09 NOTE — Telephone Encounter (Signed)
Spoke to pt. She was made aware on Friday

## 2016-04-06 ENCOUNTER — Other Ambulatory Visit: Payer: Self-pay

## 2016-04-06 MED ORDER — OXYCODONE-ACETAMINOPHEN 10-325 MG PO TABS: 1.0000 | ORAL_TABLET | Freq: Four times a day (QID) | ORAL | 0 refills | Status: DC | PRN

## 2016-04-06 NOTE — Telephone Encounter (Signed)
Spoke to pt. Rx up front ready for pickup 

## 2016-04-06 NOTE — Telephone Encounter (Signed)
Pt left v/m requesting rx oxycodone apap. Call when ready for pick up. Last printed # 180 on 03/06/16. Last seen 12/06/15

## 2016-04-07 ENCOUNTER — Other Ambulatory Visit: Payer: Self-pay | Admitting: Cardiovascular Disease

## 2016-04-15 ENCOUNTER — Other Ambulatory Visit: Payer: Self-pay | Admitting: Pharmacist

## 2016-04-15 ENCOUNTER — Telehealth: Payer: Self-pay

## 2016-04-15 NOTE — Patient Outreach (Signed)
Outreach call to Cynthia Singh regarding her request for follow up from the [facility] Medication Adherence Campaign. Called and spoke with patient. HIPAA identifiers verified and verbal consent received.  [name] reports that she has been taking her losartan-hydrochlorothiazide once daily as directed. However, patient reports that she is not taking the carvedilol 12.5 mg that is listed on her EPIC medication list. Patient reports that she was under the impression that she was to stop the carvedilol about 3 months ago when started on losartan-hydrochlorothiazide. Reports that she does not have a blood pressure monitor at home, but reports that when she last checked it at the drug store "a while ago" she noticed that it was running high.  Per office visit note in EPIC from Dr. Silvio Pate on 12/06/15, patient was to "add HCTZ to the losartan", but do not see any notation about discontinuing the carvedilol.  Cynthia Singh reports no further medication questions or concerns. Let her know that I will call to follow up with Dr. Silvio Pate regarding her carvedilol.  PLAN:  Will call Dr. Alla German office to confirm whether patient was to have continued her carvedilol therapy along with the losartan-hydrochlorothiazide.   Harlow Asa, PharmD Clinical Pharmacist [employer] [phone]

## 2016-04-15 NOTE — Telephone Encounter (Signed)
Cynthia Singh [facility] pharmacist left v/m;Cynthia Singh contacted pt about adherence to BP meds. Pt told Cynthia Singh that she was taking losartan HCTZ once daily but pt stopped taking carvedilol when started losartan HCTZ couple of months ago. Cynthia Singh request cb with what BP meds pt should be taking. Pt does not have BP kit to take BP at home.

## 2016-04-16 MED ORDER — CARVEDILOL 12.5 MG PO TABS: 12.5000 mg | ORAL_TABLET | Freq: Two times a day (BID) | ORAL | 5 refills | Status: DC

## 2016-04-16 NOTE — Telephone Encounter (Signed)
Please thank Benjamine Mola and let the patient know that she was supposed to continue the carvedilol (I was only adding a med--not exchanging)

## 2016-04-16 NOTE — Telephone Encounter (Signed)
Spoke to pt. She said thank you for following up. She needed a refill of carvedilol. I have sent that in.

## 2016-04-17 ENCOUNTER — Other Ambulatory Visit: Payer: Self-pay | Admitting: Pharmacist

## 2016-04-17 NOTE — Patient Outreach (Signed)
Receive an [place] message back from patient's PCP, Dr. Silvio Pate, regarding my request for clarification about the patient's carvedilol therapy. Dr. Silvio Pate writes that "She is supposed to still be on the carvedilol and I asked my CMA to confirm this with her. Thanks for this pick up". Perform EPIC chart review and confirm that patient was contacted and notified by Dr. Alla German office yesterday.  Will close pharmacy episode at this time.  Harlow Asa, PharmD Clinical Pharmacist [facility] [phone]

## 2016-05-06 ENCOUNTER — Other Ambulatory Visit: Payer: Self-pay

## 2016-05-06 MED ORDER — OXYCODONE-ACETAMINOPHEN 10-325 MG PO TABS: 1.0000 | ORAL_TABLET | Freq: Four times a day (QID) | ORAL | 0 refills | Status: DC | PRN

## 2016-05-06 NOTE — Telephone Encounter (Signed)
Spoke to pt

## 2016-05-06 NOTE — Telephone Encounter (Signed)
Pt left v/m requesting rx oxycodone apap. Call when ready for pick up. Last printed # 180 on 04/06/16. Pt last seen 12/06/15.

## 2016-05-06 NOTE — Telephone Encounter (Signed)
Tried to call pt, no answer and VM is not set up. Rx is up front ready for pickup

## 2016-05-21 ENCOUNTER — Other Ambulatory Visit: Payer: Self-pay | Admitting: Cardiovascular Disease

## 2016-06-05 ENCOUNTER — Other Ambulatory Visit: Payer: Self-pay

## 2016-06-05 MED ORDER — OXYCODONE-ACETAMINOPHEN 10-325 MG PO TABS: 1.0000 | ORAL_TABLET | Freq: Four times a day (QID) | ORAL | 0 refills | Status: DC | PRN

## 2016-06-05 NOTE — Telephone Encounter (Signed)
Spoke to pt. rx up front ready for pickup 

## 2016-06-05 NOTE — Telephone Encounter (Signed)
Pt left v/m requesting rx oxycodone apap. Call when ready for pick up. Last printed #180 on 05/06/16. Last seen 12/06/15.

## 2016-06-08 ENCOUNTER — Encounter: Payer: PPO | Admitting: Internal Medicine

## 2016-06-26 ENCOUNTER — Encounter (HOSPITAL_COMMUNITY): Payer: Self-pay | Admitting: *Deleted

## 2016-06-26 ENCOUNTER — Emergency Department (HOSPITAL_COMMUNITY): Payer: PPO

## 2016-06-26 ENCOUNTER — Emergency Department (HOSPITAL_COMMUNITY)
Admission: EM | Admit: 2016-06-26 | Discharge: 2016-06-27 | Disposition: A | Discharge status: Home / Self Care | Payer: PPO | Attending: Emergency Medicine | Admitting: Emergency Medicine

## 2016-06-26 ENCOUNTER — Other Ambulatory Visit: Payer: Self-pay | Admitting: Internal Medicine

## 2016-06-26 DIAGNOSIS — I10 Essential (primary) hypertension: Secondary | ICD-10-CM | POA: Insufficient documentation

## 2016-06-26 DIAGNOSIS — W19XXXA Unspecified fall, initial encounter: Secondary | ICD-10-CM

## 2016-06-26 DIAGNOSIS — E119 Type 2 diabetes mellitus without complications: Secondary | ICD-10-CM | POA: Insufficient documentation

## 2016-06-26 DIAGNOSIS — S0990XA Unspecified injury of head, initial encounter: Secondary | ICD-10-CM | POA: Diagnosis not present

## 2016-06-26 DIAGNOSIS — Y939 Activity, unspecified: Secondary | ICD-10-CM | POA: Insufficient documentation

## 2016-06-26 DIAGNOSIS — I2510 Atherosclerotic heart disease of native coronary artery without angina pectoris: Secondary | ICD-10-CM | POA: Insufficient documentation

## 2016-06-26 DIAGNOSIS — R0789 Other chest pain: Secondary | ICD-10-CM

## 2016-06-26 DIAGNOSIS — Z7982 Long term (current) use of aspirin: Secondary | ICD-10-CM | POA: Insufficient documentation

## 2016-06-26 DIAGNOSIS — R079 Chest pain, unspecified: Secondary | ICD-10-CM | POA: Diagnosis not present

## 2016-06-26 DIAGNOSIS — Z794 Long term (current) use of insulin: Secondary | ICD-10-CM | POA: Diagnosis not present

## 2016-06-26 DIAGNOSIS — Y999 Unspecified external cause status: Secondary | ICD-10-CM | POA: Diagnosis not present

## 2016-06-26 DIAGNOSIS — W01198A Fall on same level from slipping, tripping and stumbling with subsequent striking against other object, initial encounter: Secondary | ICD-10-CM | POA: Diagnosis not present

## 2016-06-26 DIAGNOSIS — Y9289 Other specified places as the place of occurrence of the external cause: Secondary | ICD-10-CM | POA: Insufficient documentation

## 2016-06-26 DIAGNOSIS — Z955 Presence of coronary angioplasty implant and graft: Secondary | ICD-10-CM | POA: Diagnosis not present

## 2016-06-26 DIAGNOSIS — S299XXA Unspecified injury of thorax, initial encounter: Secondary | ICD-10-CM | POA: Diagnosis not present

## 2016-06-26 LAB — I-STAT CHEM 8, ED
BUN: 15 mg/dL (ref 6–20)
Calcium, Ion: 1.17 mmol/L (ref 1.15–1.40)
Chloride: 100 mmol/L — ABNORMAL LOW (ref 101–111)
Creatinine, Ser: 0.7 mg/dL (ref 0.44–1.00)
Glucose, Bld: 180 mg/dL — ABNORMAL HIGH (ref 65–99)
HCT: 43 % (ref 36.0–46.0)
Hemoglobin: 14.6 g/dL (ref 12.0–15.0)
Potassium: 4.1 mmol/L (ref 3.5–5.1)
Sodium: 138 mmol/L (ref 135–145)
TCO2: 28 mmol/L (ref 0–100)

## 2016-06-26 MED ORDER — MORPHINE SULFATE 15 MG PO TABS: 15.0000 mg | ORAL_TABLET | ORAL | 0 refills | Status: DC | PRN

## 2016-06-26 MED ORDER — MORPHINE SULFATE 15 MG PO TABS
15.0000 mg | ORAL_TABLET | Freq: Once | ORAL | Status: AC
  Administered 2016-06-26: 15 mg via ORAL
  Filled 2016-06-26: qty 1

## 2016-06-26 NOTE — Discharge Instructions (Signed)
°  Also take tylenol 1000mg(2 extra strength) four times a day.   Then take the pain medicine if you feel like you need it. Narcotics do not help with the pain, they only make you care about it less.  You can become addicted to this, people may break into your house to steal it.  It will constipate you.  If you drive under the influence of this medicine you can get a DUI.    

## 2016-06-26 NOTE — ED Triage Notes (Signed)
Pt states she tripped over a book bag on the floor and she landed on her L side.  Now c/o L sided rib pain.  VS stable.

## 2016-06-26 NOTE — ED Provider Notes (Signed)
[place] DEPT Provider Note   CSN: [id number] Arrival date & time: 06/26/16  1750     History   Chief Complaint Chief Complaint  Patient presents with  . Chest Pain    HPI Cynthia Singh is a 76 y.o. female.  76 yo F with left sided chest wall pain.  Occurred after a fall today, tripped over a bookbag. The patient states that she landed on her left side. She also struck her head on the ground. Family said she's been having difficulty with her balance for the past year or so. Has had multiple falls. She says it's because her left knee hurts so bad that it buckles and makes her fall. She denies a sensation of unsteadiness. Having left lateral chest pain after the fall. Worse with movement and palpation. Denies shortness of breath and cough.   The history is provided by the patient.  Chest Pain   This is a new problem. The current episode started 3 to 5 hours ago. The problem occurs constantly. The problem has not changed since onset.The pain is present in the lateral region. The pain is at a severity of 6/10. The pain is moderate. The quality of the pain is described as sharp. Pertinent negatives include no dizziness, no fever, no headaches, no nausea, no palpitations, no shortness of breath and no vomiting. She has tried nothing for the symptoms. The treatment provided no relief. There are no known risk factors.    Past Medical History:  Diagnosis Date  . CAD (coronary artery disease)    a. Remote nonobstructive disease but in 10/2012 Cath/PCI: s/p DES to RCA. b. cath 02/08/15 DES to prox LAD and balloon angioplasty of ost D1, EF 55-65%.  . Diabetes mellitus, type 2 ([place])   . Dyslipidemia   . Episodic mood disorder ([place])   . Hypertension   . Obesity   . Osteoarthritis of spine    knees also    Patient Active Problem List   Diagnosis Date Noted  . Preventative health care 04/30/2014  . Advanced directives, counseling/discussion 04/30/2014  . Degeneration of thoracic or  thoracolumbar intervertebral disc 01/26/2014  . Atherosclerosis of native coronary artery with angina pectoris ([place]) 10/21/2012  . Obesity   . KNEE PAIN, LEFT, CHRONIC 02/25/2010  . Type 2 diabetes, uncontrolled, with neuropathy ([place]) 12/30/2009  . Hyperlipidemia 12/30/2009  . Episodic mood disorder ([place]) 12/30/2009  . Essential hypertension 12/30/2009  . Osteoarthritis of back 12/30/2009    Past Surgical History:  Procedure Laterality Date  . APPENDECTOMY  1959  . CARDIAC CATHETERIZATION N/A 02/08/2015   Procedure: Left Heart Cath and Coronary Angiography;  Surgeon: Sherren Mocha, MD;  Location: [place] CV LAB;  Service: Cardiovascular;  Laterality: N/A;  . CARDIAC CATHETERIZATION N/A 02/08/2015   Procedure: Coronary Stent Intervention;  Surgeon: Sherren Mocha, MD;  Location: [place] CV LAB;  Service: Cardiovascular;  Laterality: N/A;  . CARDIAC CATHETERIZATION N/A 02/08/2015   Procedure: Intravascular Pressure Wire/FFR Study;  Surgeon: Sherren Mocha, MD;  Location: [place] CV LAB;  Service: Cardiovascular;  Laterality: N/A;  . CHOLECYSTECTOMY    . COMBINED HYSTERECTOMY ABDOMINAL W/ A&P REPAIR / OOPHORECTOMY  1968  . CORONARY STENT PLACEMENT  2014  . [place]  . LEFT HEART CATHETERIZATION WITH CORONARY ANGIOGRAM N/A 10/20/2012   Procedure: LEFT HEART CATHETERIZATION WITH CORONARY ANGIOGRAM;  Surgeon: Sherren Mocha, MD;  Location: [facility] CATH LAB;  Service: Cardiovascular;  Laterality: N/A;  . multiple D&C    .  TONSILLECTOMY  age 42    OB History    No data available       Home Medications    Prior to Admission medications   Medication Sig Start Date End Date Taking? Authorizing Provider  amitriptyline (ELAVIL) 150 MG tablet TAKE ONE TABLET BY MOUTH EVERY NIGHT AT BEDTIME 06/26/16   Venia Carbon, MD  aspirin EC 81 MG EC tablet Take 1 tablet (81 mg total) by mouth daily. 10/21/12   Neena Rhymes, MD  atorvastatin (LIPITOR) 80 MG tablet Take 0.5  tablets (40 mg total) by mouth daily. 02/09/15   Almyra Deforest, PA  carvedilol (COREG) 12.5 MG tablet Take 1 tablet (12.5 mg total) by mouth 2 (two) times daily with a meal. 04/16/16   Venia Carbon, MD  clopidogrel (PLAVIX) 75 MG tablet TAKE 1 TABLET BY MOUTH DAILY 05/21/16   Mihai Croitoru, MD  insulin NPH Human (HUMULIN N,NOVOLIN N) 100 UNIT/ML injection Inject 30 Units into the skin daily before breakfast.    Historical Provider, MD  insulin regular (NOVOLIN R RELION) 100 units/mL injection Inject 0.08-0.1 mLs (8-10 Units total) into the skin 3 (three) times daily before meals. 03/20/14   Philemon Kingdom, MD  losartan-hydrochlorothiazide (HYZAAR) 100-12.5 MG tablet Take 1 tablet by mouth daily. 12/06/15   Venia Carbon, MD  metFORMIN (GLUCOPHAGE) 1000 MG tablet TAKE 1 TABLET BY MOUTH TWICE A DAY WITH A MEAL 01/27/16   Venia Carbon, MD  morphine (MSIR) 15 MG tablet Take 1 tablet (15 mg total) by mouth every 4 (four) hours as needed for severe pain. 06/26/16   [name], DO  nabumetone (RELAFEN) 500 MG tablet Take 1 tablet (500 mg total) by mouth daily. 12/23/14   Johnn Hai, PA-C  nitroGLYCERIN (NITROSTAT) 0.4 MG SL tablet Place 1 tablet (0.4 mg total) under the tongue every 5 (five) minutes as needed for chest pain. 11/26/12   Lendon Colonel, NP  oxyCODONE-acetaminophen (PERCOCET) 10-325 MG tablet Take 1-2 tablets by mouth every 6 (six) hours as needed for pain. 06/05/16   Venia Carbon, MD  polyethylene glycol [facility] / GLYCOLAX) packet Take 17 g by mouth daily.    Historical Provider, MD  sennosides-docusate sodium (SENOKOT-S) 8.6-50 MG tablet Take 2 tablets by mouth daily as needed for constipation.    Historical Provider, MD    Family History Family History  Problem Relation Age of Onset  . Cancer Brother     Colon  . Coronary artery disease Neg Hx   . Heart attack Neg Hx     Social History Social History  Substance Use Topics  . Smoking status: Never Smoker  . Smokeless  tobacco: Never Used  . Alcohol use No     Allergies   Patient has no known allergies.   Review of Systems Review of Systems  Constitutional: Negative for chills and fever.  HENT: Negative for congestion and rhinorrhea.   Eyes: Negative for redness and visual disturbance.  Respiratory: Negative for shortness of breath and wheezing.   Cardiovascular: Positive for chest pain. Negative for palpitations.  Gastrointestinal: Negative for nausea and vomiting.  Genitourinary: Negative for dysuria and urgency.  Musculoskeletal: Negative for arthralgias and myalgias.  Skin: Negative for pallor and wound.  Neurological: Negative for dizziness and headaches.     Physical Exam Updated Vital Signs BP 172/93   Pulse 83   Temp 98.2 F (36.8 C) (Oral)   Resp 16   Ht 5\' 8"  (1.727 m)  Wt 185 lb (83.9 kg)   SpO2 98%   BMI 28.13 kg/m   Physical Exam  Constitutional: She is oriented to person, place, and time. She appears well-developed and well-nourished. No distress.  HENT:  Head: Normocephalic and atraumatic.  Eyes: EOM are normal. Pupils are equal, round, and reactive to light.  Neck: Normal range of motion. Neck supple.  Cardiovascular: Normal rate and regular rhythm.  Exam reveals no gallop and no friction rub.   No murmur heard. Pulmonary/Chest: Effort normal. She has no wheezes. She has no rales. She exhibits tenderness (TTP about the left lateral chest wall).  Abdominal: Soft. She exhibits no distension. There is no tenderness.  Musculoskeletal: She exhibits no edema or tenderness.  No midline c spine tenderness. Palpated from head to toe without other area of bony TTP.   Neurological: She is alert and oriented to person, place, and time.  Skin: Skin is warm and dry. She is not diaphoretic.  Psychiatric: She has a normal mood and affect. Her behavior is normal.  Nursing note and vitals reviewed.    ED Treatments / Results  Labs (all labs ordered are listed, but only  abnormal results are displayed) Labs Reviewed  I-STAT CHEM 8, ED - Abnormal; Notable for the following:       Result Value   Chloride 100 (*)    Glucose, Bld 180 (*)    All other components within normal limits    EKG  EKG Interpretation None       Radiology Dg Ribs Unilateral W/chest Left  Result Date: 06/26/2016 CLINICAL DATA:  Tripped over a bag landed on left side with left rib pain EXAM: LEFT RIBS AND CHEST - 3+ VIEW COMPARISON:  02/07/2015 FINDINGS: Single-view chest demonstrates no acute infiltrate or effusion. Normal heart size. Mild atherosclerosis. No pneumothorax. No acute displaced left rib fracture is visualized. IMPRESSION: 1. No pneumothorax or pleural effusion 2. No definite acute displaced left rib fracture Electronically Signed   By: Donavan Foil M.D.   On: 06/26/2016 19:48   Ct Head Wo Contrast  Result Date: 06/26/2016 CLINICAL DATA:  Trip and fall injury striking the left side of head. History of diabetes and hypertension. EXAM: CT HEAD WITHOUT CONTRAST TECHNIQUE: Contiguous axial images were obtained from the base of the skull through the vertex without intravenous contrast. COMPARISON:  MRI brain 05/20/2009.  CT head 05/19/2009 FINDINGS: Brain: No evidence of acute infarction, hemorrhage, hydrocephalus, extra-axial collection or mass lesion/mass effect. Vascular: No hyperdense vessel or unexpected calcification. Skull: Normal. Negative for fracture or focal lesion. Sinuses/Orbits: No acute finding. Other: No significant change since prior study. IMPRESSION: No acute intracranial abnormalities. Electronically Signed   By: Lucienne Capers M.D.   On: 06/26/2016 23:26    Procedures Procedures (including critical care time)  Medications Ordered in ED Medications  morphine (MSIR) tablet 15 mg (not administered)     Initial Impression / Assessment and Plan / ED Course  I have reviewed the triage vital signs and the nursing notes.  Pertinent labs & imaging results  that were available during my care of the patient were reviewed by me and considered in my medical decision making (see chart for details).     76 yo F With what sounds like a mechanical fall. Landed on her left side. Tender there x-ray negative for rib fracture. No significant tenderness topossible fracture with symptoms. Patient didn't strike her head will obtain a CT.  CT negative, d/c home.   11:34 PM:  I have discussed the diagnosis/risks/treatment options with the patient and family and believe the pt to be eligible for discharge home to follow-up with PCP. We also discussed returning to the ED immediately if new or worsening sx occur. We discussed the sx which are most concerning (e.g., sudden worsening pain, fever, inability to tolerate by mouth) that necessitate immediate return. Medications administered to the patient during their visit and any new prescriptions provided to the patient are listed below.  Medications given during this visit Medications  morphine (MSIR) tablet 15 mg (not administered)     The patient appears reasonably screen and/or stabilized for discharge and I doubt any other medical condition or other [facility] requiring further screening, evaluation, or treatment in the ED at this time prior to discharge.    Final Clinical Impressions(s) / ED Diagnoses   Final diagnoses:  Fall, initial encounter  Chest wall pain    New Prescriptions New Prescriptions   MORPHINE (MSIR) 15 MG TABLET    Take 1 tablet (15 mg total) by mouth every 4 (four) hours as needed for severe pain.     [name], DO 06/26/16 [id number]

## 2016-07-01 ENCOUNTER — Telehealth: Payer: Self-pay

## 2016-07-01 NOTE — Telephone Encounter (Signed)
Spoke to pt. She said she is feeling better. A little sore still.

## 2016-07-03 ENCOUNTER — Other Ambulatory Visit: Payer: Self-pay

## 2016-07-03 MED ORDER — OXYCODONE-ACETAMINOPHEN 10-325 MG PO TABS: 1.0000 | ORAL_TABLET | Freq: Four times a day (QID) | ORAL | 0 refills | Status: DC | PRN

## 2016-07-03 NOTE — Telephone Encounter (Signed)
Spoke to pt. rx up front

## 2016-07-03 NOTE — Telephone Encounter (Signed)
Pt left v/m requesting rx oxycodone apap. Call when ready for pick up; pt is calling a day early because she has taken more due to fall and being seen in ED. Last printed # 180 on 06/05/16. Last seen 12/06/15.

## 2016-07-14 ENCOUNTER — Other Ambulatory Visit: Payer: Self-pay | Admitting: Cardiovascular Disease

## 2016-07-19 ENCOUNTER — Encounter (HOSPITAL_COMMUNITY): Payer: Self-pay

## 2016-07-19 ENCOUNTER — Emergency Department (HOSPITAL_COMMUNITY): Payer: PPO

## 2016-07-19 ENCOUNTER — Inpatient Hospital Stay (HOSPITAL_COMMUNITY)
Admission: EM | Admit: 2016-07-19 | Discharge: 2016-07-21 | DRG: 086 | Disposition: A | Discharge status: Home Health | Payer: PPO | Attending: Internal Medicine | Admitting: Internal Medicine

## 2016-07-19 ENCOUNTER — Observation Stay (HOSPITAL_BASED_OUTPATIENT_CLINIC_OR_DEPARTMENT_OTHER): Payer: PPO

## 2016-07-19 DIAGNOSIS — I10 Essential (primary) hypertension: Secondary | ICD-10-CM | POA: Diagnosis present

## 2016-07-19 DIAGNOSIS — M479 Spondylosis, unspecified: Secondary | ICD-10-CM | POA: Diagnosis not present

## 2016-07-19 DIAGNOSIS — S065X0A Traumatic subdural hemorrhage without loss of consciousness, initial encounter: Secondary | ICD-10-CM | POA: Diagnosis not present

## 2016-07-19 DIAGNOSIS — Z7984 Long term (current) use of oral hypoglycemic drugs: Secondary | ICD-10-CM | POA: Diagnosis not present

## 2016-07-19 DIAGNOSIS — E1140 Type 2 diabetes mellitus with diabetic neuropathy, unspecified: Secondary | ICD-10-CM | POA: Diagnosis present

## 2016-07-19 DIAGNOSIS — E860 Dehydration: Secondary | ICD-10-CM | POA: Diagnosis not present

## 2016-07-19 DIAGNOSIS — I6200 Nontraumatic subdural hemorrhage, unspecified: Secondary | ICD-10-CM | POA: Diagnosis present

## 2016-07-19 DIAGNOSIS — Y92009 Unspecified place in unspecified non-institutional (private) residence as the place of occurrence of the external cause: Secondary | ICD-10-CM

## 2016-07-19 DIAGNOSIS — S4992XA Unspecified injury of left shoulder and upper arm, initial encounter: Secondary | ICD-10-CM | POA: Diagnosis not present

## 2016-07-19 DIAGNOSIS — S065XAA Traumatic subdural hemorrhage with loss of consciousness status unknown, initial encounter: Secondary | ICD-10-CM

## 2016-07-19 DIAGNOSIS — E875 Hyperkalemia: Secondary | ICD-10-CM | POA: Diagnosis present

## 2016-07-19 DIAGNOSIS — Z79899 Other long term (current) drug therapy: Secondary | ICD-10-CM

## 2016-07-19 DIAGNOSIS — E785 Hyperlipidemia, unspecified: Secondary | ICD-10-CM | POA: Diagnosis not present

## 2016-07-19 DIAGNOSIS — Z955 Presence of coronary angioplasty implant and graft: Secondary | ICD-10-CM | POA: Diagnosis not present

## 2016-07-19 DIAGNOSIS — E871 Hypo-osmolality and hyponatremia: Secondary | ICD-10-CM | POA: Diagnosis not present

## 2016-07-19 DIAGNOSIS — Z9861 Coronary angioplasty status: Secondary | ICD-10-CM

## 2016-07-19 DIAGNOSIS — S065X1A Traumatic subdural hemorrhage with loss of consciousness of 30 minutes or less, initial encounter: Principal | ICD-10-CM | POA: Diagnosis present

## 2016-07-19 DIAGNOSIS — S4991XA Unspecified injury of right shoulder and upper arm, initial encounter: Secondary | ICD-10-CM | POA: Diagnosis not present

## 2016-07-19 DIAGNOSIS — E1165 Type 2 diabetes mellitus with hyperglycemia: Secondary | ICD-10-CM | POA: Diagnosis not present

## 2016-07-19 DIAGNOSIS — Z7982 Long term (current) use of aspirin: Secondary | ICD-10-CM | POA: Diagnosis not present

## 2016-07-19 DIAGNOSIS — M25519 Pain in unspecified shoulder: Secondary | ICD-10-CM | POA: Diagnosis not present

## 2016-07-19 DIAGNOSIS — W01190A Fall on same level from slipping, tripping and stumbling with subsequent striking against furniture, initial encounter: Secondary | ICD-10-CM | POA: Diagnosis not present

## 2016-07-19 DIAGNOSIS — M25512 Pain in left shoulder: Secondary | ICD-10-CM | POA: Diagnosis not present

## 2016-07-19 DIAGNOSIS — M25511 Pain in right shoulder: Secondary | ICD-10-CM | POA: Diagnosis not present

## 2016-07-19 DIAGNOSIS — G8929 Other chronic pain: Secondary | ICD-10-CM | POA: Diagnosis present

## 2016-07-19 DIAGNOSIS — M549 Dorsalgia, unspecified: Secondary | ICD-10-CM

## 2016-07-19 DIAGNOSIS — I2510 Atherosclerotic heart disease of native coronary artery without angina pectoris: Secondary | ICD-10-CM

## 2016-07-19 DIAGNOSIS — R296 Repeated falls: Secondary | ICD-10-CM | POA: Diagnosis not present

## 2016-07-19 DIAGNOSIS — R51 Headache: Secondary | ICD-10-CM | POA: Diagnosis not present

## 2016-07-19 DIAGNOSIS — Z7902 Long term (current) use of antithrombotics/antiplatelets: Secondary | ICD-10-CM

## 2016-07-19 DIAGNOSIS — I161 Hypertensive emergency: Secondary | ICD-10-CM | POA: Diagnosis not present

## 2016-07-19 DIAGNOSIS — R52 Pain, unspecified: Secondary | ICD-10-CM | POA: Diagnosis not present

## 2016-07-19 DIAGNOSIS — I6529 Occlusion and stenosis of unspecified carotid artery: Secondary | ICD-10-CM

## 2016-07-19 DIAGNOSIS — Z66 Do not resuscitate: Secondary | ICD-10-CM | POA: Diagnosis not present

## 2016-07-19 DIAGNOSIS — E1159 Type 2 diabetes mellitus with other circulatory complications: Secondary | ICD-10-CM | POA: Diagnosis present

## 2016-07-19 DIAGNOSIS — IMO0002 Reserved for concepts with insufficient information to code with codable children: Secondary | ICD-10-CM

## 2016-07-19 DIAGNOSIS — S065X9A Traumatic subdural hemorrhage with loss of consciousness of unspecified duration, initial encounter: Secondary | ICD-10-CM

## 2016-07-19 LAB — BASIC METABOLIC PANEL
Anion gap: 14 (ref 5–15)
Anion gap: 9 (ref 5–15)
BUN: 20 mg/dL (ref 6–20)
BUN: 15 mg/dL (ref 6–20)
CO2: 23 mmol/L (ref 22–32)
CO2: 24 mmol/L (ref 22–32)
Calcium: 9.5 mg/dL (ref 8.9–10.3)
Calcium: 9.2 mg/dL (ref 8.9–10.3)
Chloride: 94 mmol/L — ABNORMAL LOW (ref 101–111)
Chloride: 100 mmol/L — ABNORMAL LOW (ref 101–111)
Creatinine, Ser: 0.74 mg/dL (ref 0.44–1.00)
Creatinine, Ser: 0.84 mg/dL (ref 0.44–1.00)
GFR calc Af Amer: >60 mL/min (ref >60)
GFR calc Af Amer: >60 mL/min (ref >60)
GFR calc non Af Amer: >60 mL/min (ref >60)
GFR calc non Af Amer: >60 mL/min (ref >60)
Glucose, Bld: 277 mg/dL — ABNORMAL HIGH (ref 65–99)
Glucose, Bld: 285 mg/dL — ABNORMAL HIGH (ref 65–99)
Potassium: 5 mmol/L (ref 3.5–5.1)
Potassium: 4.2 mmol/L (ref 3.5–5.1)
Sodium: 131 mmol/L — ABNORMAL LOW (ref 135–145)
Sodium: 133 mmol/L — ABNORMAL LOW (ref 135–145)

## 2016-07-19 LAB — GLUCOSE, CAPILLARY
Glucose-Capillary: 218 mg/dL — ABNORMAL HIGH (ref 65–99)
Glucose-Capillary: 258 mg/dL — ABNORMAL HIGH (ref 65–99)
Glucose-Capillary: 217 mg/dL — ABNORMAL HIGH (ref 65–99)
Glucose-Capillary: 222 mg/dL — ABNORMAL HIGH (ref 65–99)

## 2016-07-19 LAB — CBC WITH DIFFERENTIAL/PLATELET
Basophils Absolute: 0 10*3/uL (ref 0.0–0.1)
Basophils Relative: 0 %
Eosinophils Absolute: 0 10*3/uL (ref 0.0–0.7)
Eosinophils Relative: 0 %
HCT: 39.9 % (ref 36.0–46.0)
Hemoglobin: 13.7 g/dL (ref 12.0–15.0)
Lymphocytes Relative: 22 %
Lymphs Abs: 2.7 10*3/uL (ref 0.7–4.0)
MCH: 30.4 pg (ref 26.0–34.0)
MCHC: 34.3 g/dL (ref 30.0–36.0)
MCV: 88.5 fL (ref 78.0–100.0)
Monocytes Absolute: 0.8 10*3/uL (ref 0.1–1.0)
Monocytes Relative: 6 %
Neutro Abs: 8.9 10*3/uL — ABNORMAL HIGH (ref 1.7–7.7)
Neutrophils Relative %: 72 %
Platelets: 244 10*3/uL (ref 150–400)
RBC: 4.51 MIL/uL (ref 3.87–5.11)
RDW: 12.2 % (ref 11.5–15.5)
WBC: 12.4 10*3/uL — ABNORMAL HIGH (ref 4.0–10.5)

## 2016-07-19 LAB — VAS US CAROTID
LEFT ECA DIAS: -19 cm/s
LEFT VERTEBRAL DIAS: 16 cm/s
Left CCA dist dias: -18 cm/s
Left CCA dist sys: -92 cm/s
Left CCA prox dias: 20 cm/s
Left CCA prox sys: 96 cm/s
Left ICA dist dias: -25 cm/s
Left ICA dist sys: -95 cm/s
Left ICA prox dias: -44 cm/s
Left ICA prox sys: -179 cm/s
RIGHT ECA DIAS: -15 cm/s
RIGHT VERTEBRAL DIAS: 27 cm/s
Right CCA prox dias: -24 cm/s
Right CCA prox sys: -132 cm/s
Right cca dist sys: -76 cm/s

## 2016-07-19 LAB — I-STAT CHEM 8, ED
BUN: 27 mg/dL — ABNORMAL HIGH (ref 6–20)
Calcium, Ion: 1.16 mmol/L (ref 1.15–1.40)
Chloride: 99 mmol/L — ABNORMAL LOW (ref 101–111)
Creatinine, Ser: 0.8 mg/dL (ref 0.44–1.00)
Glucose, Bld: 290 mg/dL — ABNORMAL HIGH (ref 65–99)
HCT: 41 % (ref 36.0–46.0)
Hemoglobin: 13.9 g/dL (ref 12.0–15.0)
Potassium: 5.2 mmol/L — ABNORMAL HIGH (ref 3.5–5.1)
Sodium: 133 mmol/L — ABNORMAL LOW (ref 135–145)
TCO2: 29 mmol/L (ref 0–100)

## 2016-07-19 LAB — MAGNESIUM: Magnesium: 2.1 mg/dL (ref 1.7–2.4)

## 2016-07-19 LAB — PROTIME-INR
INR: 0.93
Prothrombin Time: 12.4 seconds (ref 11.4–15.2)

## 2016-07-19 MED ORDER — MORPHINE SULFATE (PF) 2 MG/ML IV SOLN
1.0000 mg | INTRAVENOUS | Status: DC | PRN
Start: 2016-07-19 — End: 2016-07-20
  Administered 2016-07-19 (×2): 1 mg via INTRAVENOUS
  Filled 2016-07-19 (×2): qty 1

## 2016-07-19 MED ORDER — CYCLOBENZAPRINE HCL 10 MG PO TABS
5.0000 mg | ORAL_TABLET | Freq: Three times a day (TID) | ORAL | Status: DC | PRN
  Administered 2016-07-19 – 2016-07-20 (×2): 5 mg via ORAL
  Filled 2016-07-19 (×2): qty 1

## 2016-07-19 MED ORDER — LORAZEPAM 2 MG/ML IJ SOLN: 1.0000 mg | INTRAMUSCULAR | Status: DC | PRN

## 2016-07-19 MED ORDER — LOSARTAN POTASSIUM-HCTZ 100-12.5 MG PO TABS: 1.0000 | ORAL_TABLET | Freq: Every day | ORAL | Status: DC

## 2016-07-19 MED ORDER — CLOPIDOGREL BISULFATE 75 MG PO TABS: 75.0000 mg | ORAL_TABLET | Freq: Every day | ORAL | Status: DC

## 2016-07-19 MED ORDER — CARVEDILOL 12.5 MG PO TABS
25.0000 mg | ORAL_TABLET | Freq: Two times a day (BID) | ORAL | Status: DC
  Administered 2016-07-19 – 2016-07-21 (×4): 25 mg via ORAL
  Filled 2016-07-19 (×4): qty 2

## 2016-07-19 MED ORDER — SENNOSIDES-DOCUSATE SODIUM 8.6-50 MG PO TABS: 2.0000 | ORAL_TABLET | Freq: Every day | ORAL | Status: DC | PRN

## 2016-07-19 MED ORDER — CARVEDILOL 12.5 MG PO TABS
12.5000 mg | ORAL_TABLET | Freq: Two times a day (BID) | ORAL | Status: DC
  Administered 2016-07-19: 12.5 mg via ORAL
  Filled 2016-07-19: qty 1

## 2016-07-19 MED ORDER — ONDANSETRON HCL 4 MG PO TABS: 4.0000 mg | ORAL_TABLET | Freq: Four times a day (QID) | ORAL | Status: DC | PRN

## 2016-07-19 MED ORDER — ATORVASTATIN CALCIUM 40 MG PO TABS
40.0000 mg | ORAL_TABLET | Freq: Every day | ORAL | Status: DC
  Administered 2016-07-19 – 2016-07-20 (×2): 40 mg via ORAL
  Filled 2016-07-19 (×2): qty 1

## 2016-07-19 MED ORDER — LOSARTAN POTASSIUM 50 MG PO TABS
100.0000 mg | ORAL_TABLET | Freq: Every day | ORAL | Status: DC
  Administered 2016-07-19 – 2016-07-21 (×2): 100 mg via ORAL
  Filled 2016-07-19 (×2): qty 2

## 2016-07-19 MED ORDER — OXYCODONE-ACETAMINOPHEN 5-325 MG PO TABS
1.0000 | ORAL_TABLET | Freq: Four times a day (QID) | ORAL | Status: DC | PRN
  Administered 2016-07-19 – 2016-07-21 (×5): 2 via ORAL
  Filled 2016-07-19 (×5): qty 2

## 2016-07-19 MED ORDER — SODIUM CHLORIDE 0.9 % IV SOLN
INTRAVENOUS | Status: DC
  Administered 2016-07-19 (×2): via INTRAVENOUS

## 2016-07-19 MED ORDER — SODIUM CHLORIDE 0.9% FLUSH
3.0000 mL | Freq: Two times a day (BID) | INTRAVENOUS | Status: DC
  Administered 2016-07-19 – 2016-07-21 (×2): 3 mL via INTRAVENOUS

## 2016-07-19 MED ORDER — ACETAMINOPHEN 500 MG PO TABS
1000.0000 mg | ORAL_TABLET | Freq: Once | ORAL | Status: AC
  Administered 2016-07-19: 1000 mg via ORAL
  Filled 2016-07-19: qty 2

## 2016-07-19 MED ORDER — ACETAMINOPHEN 650 MG RE SUPP: 650.0000 mg | Freq: Four times a day (QID) | RECTAL | Status: DC | PRN

## 2016-07-19 MED ORDER — OXYCODONE HCL 5 MG PO TABS
5.0000 mg | ORAL_TABLET | Freq: Four times a day (QID) | ORAL | Status: DC | PRN
  Administered 2016-07-19 – 2016-07-21 (×6): 10 mg via ORAL
  Filled 2016-07-19 (×6): qty 2

## 2016-07-19 MED ORDER — INSULIN ASPART 100 UNIT/ML ~~LOC~~ SOLN
0.0000 [IU] | Freq: Every day | SUBCUTANEOUS | Status: DC
  Administered 2016-07-19 – 2016-07-20 (×2): 2 [IU] via SUBCUTANEOUS

## 2016-07-19 MED ORDER — POLYETHYLENE GLYCOL 3350 17 G PO PACK: 17.0000 g | PACK | Freq: Every day | ORAL | Status: DC | PRN

## 2016-07-19 MED ORDER — AMLODIPINE BESYLATE 2.5 MG PO TABS
2.5000 mg | ORAL_TABLET | Freq: Every day | ORAL | Status: DC
  Administered 2016-07-19: 2.5 mg via ORAL
  Filled 2016-07-19: qty 1

## 2016-07-19 MED ORDER — IBUPROFEN 800 MG PO TABS
800.0000 mg | ORAL_TABLET | Freq: Once | ORAL | Status: AC
  Administered 2016-07-19: 800 mg via ORAL
  Filled 2016-07-19: qty 1

## 2016-07-19 MED ORDER — INSULIN ASPART 100 UNIT/ML ~~LOC~~ SOLN
0.0000 [IU] | Freq: Three times a day (TID) | SUBCUTANEOUS | Status: DC
  Administered 2016-07-19: 3 [IU] via SUBCUTANEOUS
  Administered 2016-07-19: 5 [IU] via SUBCUTANEOUS
  Administered 2016-07-19: 3 [IU] via SUBCUTANEOUS
  Administered 2016-07-20: 2 [IU] via SUBCUTANEOUS
  Administered 2016-07-20: 3 [IU] via SUBCUTANEOUS
  Administered 2016-07-21 (×2): 2 [IU] via SUBCUTANEOUS

## 2016-07-19 MED ORDER — HYDRALAZINE HCL 20 MG/ML IJ SOLN
10.0000 mg | Freq: Four times a day (QID) | INTRAMUSCULAR | Status: DC | PRN
  Administered 2016-07-19: 10 mg via INTRAVENOUS
  Filled 2016-07-19: qty 1

## 2016-07-19 MED ORDER — AMITRIPTYLINE HCL 25 MG PO TABS
150.0000 mg | ORAL_TABLET | Freq: Every day | ORAL | Status: DC
  Administered 2016-07-19 – 2016-07-20 (×2): 150 mg via ORAL
  Filled 2016-07-19 (×2): qty 6

## 2016-07-19 MED ORDER — ASPIRIN 81 MG PO TBEC: 81.0000 mg | DELAYED_RELEASE_TABLET | Freq: Every day | ORAL | Status: DC

## 2016-07-19 MED ORDER — OXYCODONE HCL 5 MG PO TABS
2.5000 mg | ORAL_TABLET | Freq: Once | ORAL | Status: AC
  Administered 2016-07-19: 2.5 mg via ORAL
  Filled 2016-07-19: qty 1

## 2016-07-19 MED ORDER — ONDANSETRON HCL 4 MG/2ML IJ SOLN: 4.0000 mg | Freq: Four times a day (QID) | INTRAMUSCULAR | Status: DC | PRN

## 2016-07-19 MED ORDER — ACETAMINOPHEN 500 MG PO TABS: 1000.0000 mg | ORAL_TABLET | Freq: Once | ORAL | Status: DC

## 2016-07-19 MED ORDER — ACETAMINOPHEN 325 MG PO TABS: 650.0000 mg | ORAL_TABLET | Freq: Four times a day (QID) | ORAL | Status: DC | PRN

## 2016-07-19 MED ORDER — OXYCODONE-ACETAMINOPHEN 10-325 MG PO TABS: 1.0000 | ORAL_TABLET | Freq: Four times a day (QID) | ORAL | Status: DC | PRN

## 2016-07-19 NOTE — Progress Notes (Signed)
Inpatient Diabetes Program Recommendations  AACE/ADA: New Consensus Statement on Inpatient Glycemic Control (2015)  Target Ranges:  Prepandial:   less than 140 mg/dL      Peak postprandial:   less than 180 mg/dL (1-2 hours)      Critically ill patients:  140 - 180 mg/dL   Results for Cynthia Singh, Cynthia Singh (MRN [id number]) as of 07/19/2016 14:35  Ref. Range 07/19/2016 08:31 07/19/2016 12:06  Glucose-Capillary Latest Ref Range: 65 - 99 mg/dL 218 (H) 258 (H)    Admit with: Fall/ Subdural Hematoma  History: DM  Home DM Meds: Metformin 1000 mg BID       NPH Insulin: 30 units daily (per Chart Review records from last PCP visit with Dr. Silvio Pate 12/06/15)       Regular Insulin: 8-10 units TID with meals (per Chart Review records from last PCP visit with Dr. Silvio Pate 12/06/15)  Current Insulin Orders: Novolog Sensitive Correction Scale/ SSI (0-9 units) TID AC + HS       MD- Note patient last saw her PCP (Dr. Silvio Pate with [facility]) on 12/06/15.  In review of Dr. Alla German notes, patient was supposed to be taking NPH Insulin 30 units daily + Regular Insulin 8-10 units TID with meals.  Not sure why these two insulins did not show up on her [facility] file?  Note current A1c pending- Last A1c was 8% back in August 2017.  Please consider starting basal insulin for this patient in hospital- Recommend Lantus 17 units daily (0.2 units/kg dosing based on weight of 84 kg to start)    --Will follow patient during hospitalization--  Wyn Quaker RN, MSN, CDE Diabetes Coordinator Inpatient Glycemic Control Team Team Pager: [phone] (8a-5p)

## 2016-07-19 NOTE — Progress Notes (Signed)
Patient complaints of headache pain.  Vital signs obtained, medicated for PRN for hypertension with effective results.  PRN medication administered for headache, Kpad applied for shoulder discomfort.  Alert, verbally pleasant. Family supportive and at bedside. Will continue to monitor.

## 2016-07-19 NOTE — ED Notes (Signed)
Pt to be transported after 7:30am/Report called by Night shift RN

## 2016-07-19 NOTE — H&P (Signed)
History and Physical    Cynthia Singh [id number] DOB: 05-May-1941 DOA: 07/19/2016   PCP: [name], MD   Patient coming from/Resides with: Private residence/this with granddaughter, grandchildren and adopted 74 year old son who is also her grandchild  Admission status: Observation/telemetry -it may be medically necessary to stay a minimum 2 midnights to rule out impending and/or unexpected changes in physiologic status that may differ from initial evaluation performed in the ER and/or at time of admission therefore consider reevaluation of admission status and 24hrs.   Chief Complaint: Mechanical fall w/ bilateral SDH  HPI: Cynthia Singh is a 76 y.o. female with medical history significant for diabetes mellitus on metformin with poor control, hypertension and dyslipidemia, significant osteoarthritis on chronic narcotics. Patient has had 2 significant falls in the past month: The first was several weeks ago when she tripped over her son's book bag and the second was yesterday when she was walking to the door without her walker and did not see the edge of a coffee table and tripped over this and fell. She has not had any dizziness or weakness upon standing. She has noticed she is more tired with walking significant distances.   ED Course:  Vital Signs: BP 188/80   Pulse 79   Temp 97.7 F (36.5 C) (Oral)   Resp 16   SpO2 97%  DG Bilateral shoulder: No acute fracture or subluxation CT head without contrast: Acute subdural bleed tracking along both sides of the falx cerebri superiorly and posteriorly; mild soft tissue swelling overlying the high frontal, barium CT cervical spine without contrast: No acute fracture or subluxation; incidental finding of calcification of the carotid bifurcations bilaterally with likely mild to moderate left-sided luminal narrowing with carotid ultrasound recommended for further evaluation Lab data: Sodium 131, potassium 5.0, chloride 94, CO2 23,  glucose 277, BUN 20, creatinine 0.74, white count 12,400 with normal differential, hemoglobin 13.7, platelets 244,000, coags normal Medications and treatments: Tylenol 1 g 1, Advil 800 mg 1 oxycodone 2.5 mg 1  Review of Systems:  In addition to the HPI above,  No Fever-chills, myalgias or other constitutional symptoms No acute changes with Vision or hearing, no new weakness, tingling, numbness in any extremity, dizziness, dysarthria or word finding difficulty, gait disturbance or imbalance, tremors or seizure activity No problems swallowing food or Liquids, indigestion/reflux, choking or coughing while eating, abdominal pain with or after eating No Chest pain, Cough or Shortness of Breath, palpitations, orthopnea or DOE No Abdominal pain, N/V, melena,hematochezia, dark tarry stools, constipation No dysuria, malodorous urine, hematuria or flank pain No new skin rashes, lesions, masses or bruises, No new joint pains, aches, swelling or redness No recent unintentional weight gain or loss No polyuria, polydypsia or polyphagia   Past Medical History:  Diagnosis Date  . CAD (coronary artery disease)    a. Remote nonobstructive disease but in 10/2012 Cath/PCI: s/p DES to RCA. b. cath 02/08/15 DES to prox LAD and balloon angioplasty of ost D1, EF 55-65%.  . Diabetes mellitus, type 2 ([place])   . Dyslipidemia   . Episodic mood disorder ([place])   . Hypertension   . Obesity   . Osteoarthritis of spine    knees also    Past Surgical History:  Procedure Laterality Date  . APPENDECTOMY  1959  . CARDIAC CATHETERIZATION N/A 02/08/2015   Procedure: Left Heart Cath and Coronary Angiography;  Surgeon: Sherren Mocha, MD;  Location: [place] [facility];  Service: Cardiovascular;  Laterality: N/A;  . CARDIAC  CATHETERIZATION N/A 02/08/2015   Procedure: Coronary Stent Intervention;  Surgeon: Sherren Mocha, MD;  Location: [place];  Service: Cardiovascular;  Laterality: N/A;  . CARDIAC  CATHETERIZATION N/A 02/08/2015   Procedure: Intravascular Pressure Wire/FFR Study;  Surgeon: Sherren Mocha, MD;  Location: [place] [facility];  Service: Cardiovascular;  Laterality: N/A;  . CHOLECYSTECTOMY    . COMBINED HYSTERECTOMY ABDOMINAL W/ A&P REPAIR / OOPHORECTOMY  1968  . CORONARY STENT PLACEMENT  2014  . [place]  . LEFT HEART CATHETERIZATION WITH CORONARY ANGIOGRAM N/A 10/20/2012   Procedure: LEFT HEART CATHETERIZATION WITH CORONARY ANGIOGRAM;  Surgeon: Sherren Mocha, MD;  Location: [facility];  Service: Cardiovascular;  Laterality: N/A;  . multiple D&C    . TONSILLECTOMY  age 87    Social History   Social History  . Marital status: Widowed    Spouse name: N/A  . Number of children: 3  . Years of education: 12   Occupational History  . CASHIER    Social History Main Topics  . Smoking status: Never Smoker  . Smokeless tobacco: Never Used  . Alcohol use No  . Drug use: No  . Sexual activity: No   Other Topics Concern  . Not on file   Social History Narrative   Widowed '13. 2 sons- '63, '66; one daughter-'69;    17 grandchildren--has raised 9 of her grandchildren 4 great grandchildren.   Lives with 2 granddaughters and adoptive son      Has living will   Probably would have son Lilybeth Ernsberger as health care POA   Would accept resuscitation but no prolonged ventilation   Not sure about tube feeds    Mobility: Rolling walker but utilizes infrequently Work history: None   No Known Allergies  Family History  Problem Relation Age of Onset  . Cancer Brother     Colon  . Coronary artery disease Neg Hx   . Heart attack Neg Hx      Prior to Admission medications   Medication Sig Start Date End Date Taking? Authorizing Provider  amitriptyline (ELAVIL) 150 MG tablet TAKE ONE TABLET BY MOUTH EVERY NIGHT AT BEDTIME 06/26/16  Yes Venia Carbon, MD  aspirin EC 81 MG EC tablet Take 1 tablet (81 mg total) by mouth daily. 10/21/12  Yes Neena Rhymes,  MD  atorvastatin (LIPITOR) 80 MG tablet Take 0.5 tablets (40 mg total) by mouth daily. 02/09/15  Yes Almyra Deforest, PA  carvedilol (COREG) 12.5 MG tablet Take 1 tablet (12.5 mg total) by mouth 2 (two) times daily with a meal. 04/16/16  Yes Venia Carbon, MD  clopidogrel (PLAVIX) 75 MG tablet TAKE 1 TABLET BY MOUTH DAILY 05/21/16  Yes Mihai Croitoru, MD  losartan-hydrochlorothiazide (HYZAAR) 100-12.5 MG tablet Take 1 tablet by mouth daily. 12/06/15  Yes Venia Carbon, MD  metFORMIN (GLUCOPHAGE) 1000 MG tablet TAKE 1 TABLET BY MOUTH TWICE A DAY WITH A MEAL 01/27/16  Yes Venia Carbon, MD  nitroGLYCERIN (NITROSTAT) 0.4 MG SL tablet Place 1 tablet (0.4 mg total) under the tongue every 5 (five) minutes as needed for chest pain. 11/26/12  Yes Lendon Colonel, NP  oxyCODONE-acetaminophen (PERCOCET) 10-325 MG tablet Take 1-2 tablets by mouth every 6 (six) hours as needed for pain. 07/03/16  Yes Venia Carbon, MD  polyethylene glycol [facility] / GLYCOLAX) packet Take 17 g by mouth daily as needed for mild constipation.    Yes Historical Provider, MD  sennosides-docusate  sodium (SENOKOT-S) 8.6-50 MG tablet Take 2 tablets by mouth daily as needed for constipation.   Yes Historical Provider, MD    Physical Exam: Vitals:   07/19/16 0330 07/19/16 0400 07/19/16 0630 07/19/16 0733  BP: 164/66 162/64 188/80 (!) 175/76  Pulse: 69 71 79 74  Resp: 16 13 16 16   Temp:    98.6 F (37 C)  TempSrc:    Oral  SpO2: 97% 97% 97% 97%      Constitutional: NAD, calm, comfortable-reports HA Eyes: PERRL, lids and conjunctivae normal ENMT: Mucous membranes are moist. Posterior pharynx clear of any exudate or lesions.Normal dentition.  Neck: normal, supple, no masses, no thyromegaly Respiratory: clear to auscultation bilaterally, no wheezing, no crackles. Normal respiratory effort. No accessory muscle use.  Cardiovascular: Regular rate and rhythm, no murmurs / rubs / gallops. No extremity edema. 2+ pedal pulses. No  carotid bruits.  Abdomen: no tenderness, no masses palpated. No hepatosplenomegaly. Bowel sounds positive.  Musculoskeletal: no clubbing / cyanosis. No joint deformity upper and lower extremities. Good ROM, no contractures. Normal muscle tone.  Skin: no rashes, lesions, ulcers. No induration Neurologic: CN 2-12 grossly intact. Sensation intact, DTR normal. Strength 5/5 x all 4 extremities.  Psychiatric: Normal judgment and insight. Alert and oriented x 3. Normal mood.    Labs on Admission: I have personally reviewed following labs and imaging studies  CBC:  Recent Labs Lab 07/19/16 0443 07/19/16 0510  WBC 12.4*  --   NEUTROABS 8.9*  --   HGB 13.7 13.9  HCT 39.9 41.0  MCV 88.5  --   PLT 244  --    Basic Metabolic Panel:  Recent Labs Lab 07/19/16 0443 07/19/16 0510  NA 131* 133*  K 5.0 5.2*  CL 94* 99*  CO2 23  --   GLUCOSE 277* 290*  BUN 20 27*  CREATININE 0.74 0.80  CALCIUM 9.5  --    GFR: CrCl cannot be calculated (Unknown ideal weight.). Liver Function Tests: No results for input(s): AST, ALT, ALKPHOS, BILITOT, PROT, ALBUMIN in the last 168 hours. No results for input(s): LIPASE, AMYLASE in the last 168 hours. No results for input(s): AMMONIA in the last 168 hours. Coagulation Profile:  Recent Labs Lab 07/19/16 0443  INR 0.93   Cardiac Enzymes: No results for input(s): CKTOTAL, CKMB, CKMBINDEX, TROPONINI in the last 168 hours. BNP (last 3 results) No results for input(s): PROBNP in the last 8760 hours. HbA1C: No results for input(s): HGBA1C in the last 72 hours. CBG:  Recent Labs Lab 07/19/16 0831  GLUCAP 218*   Lipid Profile: No results for input(s): CHOL, HDL, LDLCALC, TRIG, CHOLHDL, LDLDIRECT in the last 72 hours. Thyroid Function Tests: No results for input(s): TSH, T4TOTAL, FREET4, T3FREE, THYROIDAB in the last 72 hours. Anemia Panel: No results for input(s): VITAMINB12, FOLATE, FERRITIN, TIBC, IRON, RETICCTPCT in the last 72 hours. Urine  analysis:    Component Value Date/Time   COLORURINE YELLOW 10/19/2012 1030   APPEARANCEUR CLOUDY (A) 10/19/2012 1030   LABSPEC 1.044 (H) 10/19/2012 1030   PHURINE 5.5 10/19/2012 1030   GLUCOSEU >1000 (A) 10/19/2012 1030   HGBUR NEGATIVE 10/19/2012 1030   BILIRUBINUR NEGATIVE 10/19/2012 1030   KETONESUR NEGATIVE 10/19/2012 1030   PROTEINUR NEGATIVE 10/19/2012 1030   UROBILINOGEN 1.0 10/19/2012 1030   NITRITE NEGATIVE 10/19/2012 1030   LEUKOCYTESUR SMALL (A) 10/19/2012 1030   Sepsis Labs: @LABRCNTIP (procalcitonin:4,lacticidven:4) )No results found for this or any previous visit (from the past 240 hour(s)).   Radiological Exams  on Admission: Dg Shoulder Right  Result Date: 07/19/2016 CLINICAL DATA:  Trip and fall with bilateral shoulder pain. EXAM: RIGHT SHOULDER - 2+ VIEW COMPARISON:  None. FINDINGS: There is no evidence of fracture or dislocation. Degenerative change at the acromioclavicular joint. Minimal glenohumeral osteoarthritis. Soft tissues are unremarkable. IMPRESSION: No acute fracture or subluxation of the right shoulder. Electronically Signed   By: Jeb Levering M.D.   On: 07/19/2016 02:28   Ct Head Wo Contrast  Result Date: 07/19/2016 CLINICAL DATA:  Tripped and fell, hitting head on coffee table. Loss of consciousness. Headache. Forehead scalp hematoma. Concern for cervical spine injury. Initial encounter. EXAM: CT HEAD WITHOUT CONTRAST CT CERVICAL SPINE WITHOUT CONTRAST TECHNIQUE: Multidetector CT imaging of the head and cervical spine was performed following the standard protocol without intravenous contrast. Multiplanar CT image reconstructions of the cervical spine were also generated. COMPARISON:  CT of the head performed 06/26/2016, and MRI/MRA of the brain performed 05/20/2009 FINDINGS: CT HEAD FINDINGS Brain: Acute subdural bleed is noted tracking along both sides of the falx cerebri, superiorly and posteriorly. No mass lesion is seen. There is no evidence of acute  infarction. There is no evidence of hydrocephalus. Mild cerebellar atrophy is noted. The posterior fossa, including the cerebellum, brainstem and fourth ventricle, is within normal limits. The third and lateral ventricles, and basal ganglia are unremarkable in appearance. The cerebral hemispheres are symmetric in appearance, with normal gray-white differentiation. No mass effect or midline shift is seen. Vascular: No hyperdense vessel or unexpected calcification. Skull: There is no evidence of fracture; visualized osseous structures are unremarkable in appearance. Sinuses/Orbits: The orbits are within normal limits. The paranasal sinuses and mastoid air cells are well-aerated. Other: Mild soft tissue swelling is noted overlying the high frontal calvarium. CT CERVICAL SPINE FINDINGS Alignment: Normal. Skull base and vertebrae: No acute fracture. No primary bone lesion or focal pathologic process. Soft tissues and spinal canal: No prevertebral fluid or swelling. No visible canal hematoma. Disc levels: Scattered anterior and posterior disc osteophyte complexes are noted along the cervical spine. Mild degenerative change is noted about the dens. Intervertebral disc spaces are grossly preserved. Upper chest: The thyroid gland is grossly unremarkable in appearance. The visualized lung apices are clear. Calcification is seen at the carotid bifurcations bilaterally, with likely mild to moderate left-sided luminal narrowing. Other: No additional soft tissue abnormalities are seen. IMPRESSION: 1. Acute subdural bleed tracking along both sides of the falx cerebri, superiorly and posteriorly. 2. No evidence of fracture or subluxation along the cervical spine. 3. Mild soft tissue swelling overlying the high frontal calvarium. 4. Mild degenerative change along the cervical spine. 5. Calcification at the carotid bifurcations bilaterally, with likely mild to moderate left-sided luminal narrowing. Carotid ultrasound is recommended  for further evaluation, when and as deemed clinically appropriate. Critical Value/emergent results were called by telephone at the time of interpretation on 07/19/2016 at 2:51 am to Dr. [name], who verbally acknowledged these results. Electronically Signed   By: Garald Balding M.D.   On: 07/19/2016 02:52   Ct Cervical Spine Wo Contrast  Result Date: 07/19/2016 CLINICAL DATA:  Tripped and fell, hitting head on coffee table. Loss of consciousness. Headache. Forehead scalp hematoma. Concern for cervical spine injury. Initial encounter. EXAM: CT HEAD WITHOUT CONTRAST CT CERVICAL SPINE WITHOUT CONTRAST TECHNIQUE: Multidetector CT imaging of the head and cervical spine was performed following the standard protocol without intravenous contrast. Multiplanar CT image reconstructions of the cervical spine were also generated. COMPARISON:  CT  of the head performed 06/26/2016, and MRI/MRA of the brain performed 05/20/2009 FINDINGS: CT HEAD FINDINGS Brain: Acute subdural bleed is noted tracking along both sides of the falx cerebri, superiorly and posteriorly. No mass lesion is seen. There is no evidence of acute infarction. There is no evidence of hydrocephalus. Mild cerebellar atrophy is noted. The posterior fossa, including the cerebellum, brainstem and fourth ventricle, is within normal limits. The third and lateral ventricles, and basal ganglia are unremarkable in appearance. The cerebral hemispheres are symmetric in appearance, with normal gray-white differentiation. No mass effect or midline shift is seen. Vascular: No hyperdense vessel or unexpected calcification. Skull: There is no evidence of fracture; visualized osseous structures are unremarkable in appearance. Sinuses/Orbits: The orbits are within normal limits. The paranasal sinuses and mastoid air cells are well-aerated. Other: Mild soft tissue swelling is noted overlying the high frontal calvarium. CT CERVICAL SPINE FINDINGS Alignment: Normal. Skull base and  vertebrae: No acute fracture. No primary bone lesion or focal pathologic process. Soft tissues and spinal canal: No prevertebral fluid or swelling. No visible canal hematoma. Disc levels: Scattered anterior and posterior disc osteophyte complexes are noted along the cervical spine. Mild degenerative change is noted about the dens. Intervertebral disc spaces are grossly preserved. Upper chest: The thyroid gland is grossly unremarkable in appearance. The visualized lung apices are clear. Calcification is seen at the carotid bifurcations bilaterally, with likely mild to moderate left-sided luminal narrowing. Other: No additional soft tissue abnormalities are seen. IMPRESSION: 1. Acute subdural bleed tracking along both sides of the falx cerebri, superiorly and posteriorly. 2. No evidence of fracture or subluxation along the cervical spine. 3. Mild soft tissue swelling overlying the high frontal calvarium. 4. Mild degenerative change along the cervical spine. 5. Calcification at the carotid bifurcations bilaterally, with likely mild to moderate left-sided luminal narrowing. Carotid ultrasound is recommended for further evaluation, when and as deemed clinically appropriate. Critical Value/emergent results were called by telephone at the time of interpretation on 07/19/2016 at 2:51 am to Dr. [name], who verbally acknowledged these results. Electronically Signed   By: Garald Balding M.D.   On: 07/19/2016 02:52   Dg Shoulder Left  Result Date: 07/19/2016 CLINICAL DATA:  Trip and fall with bilateral shoulder pain. EXAM: LEFT SHOULDER - 2+ VIEW COMPARISON:  None. FINDINGS: There is no evidence of fracture or dislocation. Osteoarthritis of the acromioclavicular and glenohumeral joints. Questionable superior subluxation of the humeral head versus artifact, suggesting rotator cuff arthropathy. Soft tissues are unremarkable. IMPRESSION: No acute fracture or subluxation of the left shoulder. Electronically Signed   By: Jeb Levering M.D.   On: 07/19/2016 02:30     Assessment/Plan Principal Problem:   Subdural bleeding  -Patient presents after sustaining a mechanical fall with reported loss of consciousness for at least 10 minutes with imaging revealing bilateral acute subdural hematoma -Neurosurgery/[facility] recommended medical admission for observation -Neurologically intact and will continue neurological checks every 4 hours -On DAPT so will hold in the setting of acute subdural (see below) -Avoid pharmacological DVT prophylaxis/anticoagulation -Repeat CT of head in a.m.  Active Problems:   Frequent falls -Patient reports several "minor" falls with 2 "major" falls in the past month -No preceding dizziness, weakness or palpitations so does not appear consistent with syncopal etiology -Patient notes has tripped over objects in the floor or tripped against furniture; admits to poor visual acuity and only utilizes reading glasses prn and has not seen an ophthalmologist in many years-poor vision may be contributing  to her falls -Utilizes rolling walker and consistently -PT/OT evaluation -Incidental finding of possible luminal narrowing left carotid on spine CT so we'll obtain carotid duplex-she has known CAD and poorly controlled diabetes and is at risk for other large and small vessel disease    Dehydration with hyponatremia -Likely related to concomitant use of thiazide diuretic -Hold HCTZ portion of antihypertensive -NS at 75 mL per hour -Potassium was at high limits of normal so will repeat BMET at 12 noon after 4 hours of fluids -Labs in a.m.    CAD S/P percutaneous coronary angioplasty/DES (RCA-2014/LAD-2016) -Currently asymptomatic -After 2016 catheterization cardiologist recommended considering long-term DAPT or monotherapy with Brilinta since she has diabetes and multivessel coronary disease -DAPT placed on hold at admission secondary to acute bilateral SDH -Cardiology has been  consulted -Continue Lipitor and carvedilol    Type 2 diabetes, uncontrolled, with neuropathy  -Patient admits to poorly controlled diabetes "for years" -Of note, hemoglobin A1c was 11 in September prior to last stent placement and has decreased to 8 based on reading from August 2017 -HgbA1c -Holding metformin acutely -Follow CBGs -SSI -Diabetes educator consultation for reeducation -Pending results of A1c may require addition of second medication -Patient denies gait disturbance but uncertain if neuropathy playing role and recurrent falls -May benefit from Neurontin; does take Elavil at bedtime    Essential hypertension -Modestly uncontrolled since admission but patient also reporting issues with recurrent headache and neck pain post SDH -Tentatively have reordered Cozaar to begin at 2 PM on 3/4 pending results of electrolyte panel noting borderline high potassium-if remains borderline hyperkalemic despite IV fluids will need to hold ARB -Hydralazine IV prn    Hyperlipidemia -Lipitor    Osteoarthritis of back -Continue preadmission oxycodone with Tylenol     DVT prophylaxis: SCDs Code Status: DO NOT RESUSCITATE Family Communication: No family at bedside  Disposition Plan: home Consults called: Cardiology/[facility] on call MD Meda Coffee)    Cynthia Singh ANP-BC [facility] Pager [phone]   If 7PM-7AM, please contact night-coverage [email or web address] Password [email or web address]  07/19/2016, 9:09 AM

## 2016-07-19 NOTE — ED Provider Notes (Signed)
[facility] DEPT Provider Note   CSN: [id number] Arrival date & time: 07/19/16  0129  By signing my name below, I, Cynthia Singh, attest that this documentation has been prepared under the direction and in the presence of Cynthia Etienne, DO. Electronically Signed: Margit Singh, ED Scribe. 07/19/16. 1:49 AM.   History   Chief Complaint Chief Complaint  Patient presents with  . Fall    HPI Cynthia Singh is a 76 y.o. female BIB EMS who has a PMHx of HTN, CAD, HLD, and DM presents to the Emergency Department complaining of bilateral shoulder pain s/p mechanical fall a few hours PTA. Pt at baseline has gate issues per EMS and didn't have her walker when she went to stand up to answer the door for her daughter. Per her daughter, who witnessed pt's fall through the window, pt fell forward hitting her head on the coffee table. Pt denies LOC, but pt's daughter reports LOC for about 10 min. Pt notes associated mild HA at this time. Pt denies dizziness, weakness to her BUE, neck pain, back pain, nausea, vomiting, CP, and abdominal pain.  Also of note, EMS reports elevated BP en route with systolic BP in the [id number]. Pt noted to EMS that she is compliant with her HTN meds but pt's daughter denies this.   The history is provided by the patient, the EMS personnel and a relative. No language interpreter was used.    Past Medical History:  Diagnosis Date  . CAD (coronary artery disease)    a. Remote nonobstructive disease but in 10/2012 Cath/PCI: s/p DES to RCA. b. cath 02/08/15 DES to prox LAD and balloon angioplasty of ost D1, EF 55-65%.  . Diabetes mellitus, type 2 ([place])   . Dyslipidemia   . Episodic mood disorder ([place])   . Hypertension   . Obesity   . Osteoarthritis of spine    knees also    Patient Active Problem List   Diagnosis Date Noted  . Preventative health care 04/30/2014  . Advanced directives, counseling/discussion 04/30/2014  . Degeneration of thoracic or thoracolumbar  intervertebral disc 01/26/2014  . Atherosclerosis of native coronary artery with angina pectoris ([place]) 10/21/2012  . Obesity   . KNEE PAIN, LEFT, CHRONIC 02/25/2010  . Type 2 diabetes, uncontrolled, with neuropathy ([place]) 12/30/2009  . Hyperlipidemia 12/30/2009  . Episodic mood disorder ([place]) 12/30/2009  . Essential hypertension 12/30/2009  . Osteoarthritis of back 12/30/2009    Past Surgical History:  Procedure Laterality Date  . APPENDECTOMY  1959  . CARDIAC CATHETERIZATION N/A 02/08/2015   Procedure: Left Heart Cath and Coronary Angiography;  Surgeon: Sherren Mocha, MD;  Location: [facility];  Service: Cardiovascular;  Laterality: N/A;  . CARDIAC CATHETERIZATION N/A 02/08/2015   Procedure: Coronary Stent Intervention;  Surgeon: Sherren Mocha, MD;  Location: [facility];  Service: Cardiovascular;  Laterality: N/A;  . CARDIAC CATHETERIZATION N/A 02/08/2015   Procedure: Intravascular Pressure Wire/FFR Study;  Surgeon: Sherren Mocha, MD;  Location: [facility];  Service: Cardiovascular;  Laterality: N/A;  . CHOLECYSTECTOMY    . COMBINED HYSTERECTOMY ABDOMINAL W/ A&P REPAIR / OOPHORECTOMY  1968  . CORONARY STENT PLACEMENT  2014  . [place]  . LEFT HEART CATHETERIZATION WITH CORONARY ANGIOGRAM N/A 10/20/2012   Procedure: LEFT HEART CATHETERIZATION WITH CORONARY ANGIOGRAM;  Surgeon: Sherren Mocha, MD;  Location: [facility];  Service: Cardiovascular;  Laterality: N/A;  . multiple D&C    . TONSILLECTOMY  age 48  OB History    No data available       Home Medications    Prior to Admission medications   Medication Sig Start Date End Date Taking? Authorizing Provider  amitriptyline (ELAVIL) 150 MG tablet TAKE ONE TABLET BY MOUTH EVERY NIGHT AT BEDTIME 06/26/16  Yes Venia Carbon, MD  aspirin EC 81 MG EC tablet Take 1 tablet (81 mg total) by mouth daily. 10/21/12  Yes Neena Rhymes, MD  atorvastatin (LIPITOR) 80 MG tablet Take 0.5 tablets (40  mg total) by mouth daily. 02/09/15  Yes Almyra Deforest, PA  carvedilol (COREG) 12.5 MG tablet Take 1 tablet (12.5 mg total) by mouth 2 (two) times daily with a meal. 04/16/16  Yes Venia Carbon, MD  clopidogrel (PLAVIX) 75 MG tablet TAKE 1 TABLET BY MOUTH DAILY 05/21/16  Yes Mihai Croitoru, MD  losartan-hydrochlorothiazide (HYZAAR) 100-12.5 MG tablet Take 1 tablet by mouth daily. 12/06/15  Yes Venia Carbon, MD  metFORMIN (GLUCOPHAGE) 1000 MG tablet TAKE 1 TABLET BY MOUTH TWICE A DAY WITH A MEAL 01/27/16  Yes Venia Carbon, MD  nitroGLYCERIN (NITROSTAT) 0.4 MG SL tablet Place 1 tablet (0.4 mg total) under the tongue every 5 (five) minutes as needed for chest pain. 11/26/12  Yes Lendon Colonel, NP  oxyCODONE-acetaminophen (PERCOCET) 10-325 MG tablet Take 1-2 tablets by mouth every 6 (six) hours as needed for pain. 07/03/16  Yes Venia Carbon, MD  polyethylene glycol [facility] / GLYCOLAX) packet Take 17 g by mouth daily as needed for mild constipation.    Yes Historical Provider, MD  sennosides-docusate sodium (SENOKOT-S) 8.6-50 MG tablet Take 2 tablets by mouth daily as needed for constipation.   Yes Historical Provider, MD    Family History Family History  Problem Relation Age of Onset  . Cancer Brother     Colon  . Coronary artery disease Neg Hx   . Heart attack Neg Hx     Social History Social History  Substance Use Topics  . Smoking status: Never Smoker  . Smokeless tobacco: Never Used  . Alcohol use No     Allergies   Patient has no known allergies.   Review of Systems Review of Systems  Constitutional: Negative for chills and fever.  HENT: Negative for congestion and rhinorrhea.   Eyes: Negative for redness and visual disturbance.  Respiratory: Negative for shortness of breath and wheezing.   Cardiovascular: Negative for chest pain and palpitations.  Gastrointestinal: Negative for nausea and vomiting.  Genitourinary: Negative for dysuria and urgency.  Musculoskeletal:  Positive for arthralgias (posterial bilateral shoulders) and gait problem. Negative for myalgias.  Skin: Negative for pallor and wound.  Neurological: Positive for headaches. Negative for dizziness and weakness.     Physical Exam Updated Vital Signs BP 162/64   Pulse 71   Temp 97.7 F (36.5 C) (Oral)   Resp 13   SpO2 97%   Physical Exam  Constitutional: She is oriented to person, place, and time. She appears well-developed and well-nourished. No distress.  HENT:  Head: Normocephalic.  Hematoma to her forehead.  Eyes: EOM are normal. Pupils are equal, round, and reactive to light.  Neck: Normal range of motion. Neck supple.  Cardiovascular: Normal rate and regular rhythm.  Exam reveals no gallop and no friction rub.   No murmur heard. Pulmonary/Chest: Effort normal. She has no wheezes. She has no rales.  Abdominal: Soft. She exhibits no distension. There is no tenderness.  Musculoskeletal: She exhibits tenderness. She  exhibits no edema.  Diffuse tenderness bilaterally to shoulders No tenderness to shoulders 5/5 strength bilaterally Pulses, motor and sensation intact to BUE  Neurological: She is alert and oriented to person, place, and time.  Skin: Skin is warm and dry. She is not diaphoretic.  Psychiatric: She has a normal mood and affect. Her behavior is normal.  Nursing note and vitals reviewed.    ED Treatments / Results  DIAGNOSTIC STUDIES: Oxygen Saturation is 98% on RA, normal by my interpretation.   COORDINATION OF CARE: 1:38 AM-Discussed next steps with pt. Pt verbalized understanding and is agreeable with the plan.    Labs (all labs ordered are listed, but only abnormal results are displayed) Labs Reviewed  CBC WITH DIFFERENTIAL/PLATELET - Abnormal; Notable for the following:       Result Value   WBC 12.4 (*)    Neutro Abs 8.9 (*)    All other components within normal limits  BASIC METABOLIC PANEL  PROTIME-INR  I-STAT CHEM 8, ED    EKG  EKG  Interpretation None       Radiology Dg Shoulder Right  Result Date: 07/19/2016 CLINICAL DATA:  Trip and fall with bilateral shoulder pain. EXAM: RIGHT SHOULDER - 2+ VIEW COMPARISON:  None. FINDINGS: There is no evidence of fracture or dislocation. Degenerative change at the acromioclavicular joint. Minimal glenohumeral osteoarthritis. Soft tissues are unremarkable. IMPRESSION: No acute fracture or subluxation of the right shoulder. Electronically Signed   By: Jeb Levering M.D.   On: 07/19/2016 02:28   Ct Head Wo Contrast  Result Date: 07/19/2016 CLINICAL DATA:  Tripped and fell, hitting head on coffee table. Loss of consciousness. Headache. Forehead scalp hematoma. Concern for cervical spine injury. Initial encounter. EXAM: CT HEAD WITHOUT CONTRAST CT CERVICAL SPINE WITHOUT CONTRAST TECHNIQUE: Multidetector CT imaging of the head and cervical spine was performed following the standard protocol without intravenous contrast. Multiplanar CT image reconstructions of the cervical spine were also generated. COMPARISON:  CT of the head performed 06/26/2016, and MRI/MRA of the brain performed 05/20/2009 FINDINGS: CT HEAD FINDINGS Brain: Acute subdural bleed is noted tracking along both sides of the falx cerebri, superiorly and posteriorly. No mass lesion is seen. There is no evidence of acute infarction. There is no evidence of hydrocephalus. Mild cerebellar atrophy is noted. The posterior fossa, including the cerebellum, brainstem and fourth ventricle, is within normal limits. The third and lateral ventricles, and basal ganglia are unremarkable in appearance. The cerebral hemispheres are symmetric in appearance, with normal gray-white differentiation. No mass effect or midline shift is seen. Vascular: No hyperdense vessel or unexpected calcification. Skull: There is no evidence of fracture; visualized osseous structures are unremarkable in appearance. Sinuses/Orbits: The orbits are within normal limits. The  paranasal sinuses and mastoid air cells are well-aerated. Other: Mild soft tissue swelling is noted overlying the high frontal calvarium. CT CERVICAL SPINE FINDINGS Alignment: Normal. Skull base and vertebrae: No acute fracture. No primary bone lesion or focal pathologic process. Soft tissues and spinal canal: No prevertebral fluid or swelling. No visible canal hematoma. Disc levels: Scattered anterior and posterior disc osteophyte complexes are noted along the cervical spine. Mild degenerative change is noted about the dens. Intervertebral disc spaces are grossly preserved. Upper chest: The thyroid gland is grossly unremarkable in appearance. The visualized lung apices are clear. Calcification is seen at the carotid bifurcations bilaterally, with likely mild to moderate left-sided luminal narrowing. Other: No additional soft tissue abnormalities are seen. IMPRESSION: 1. Acute subdural bleed tracking along  both sides of the falx cerebri, superiorly and posteriorly. 2. No evidence of fracture or subluxation along the cervical spine. 3. Mild soft tissue swelling overlying the high frontal calvarium. 4. Mild degenerative change along the cervical spine. 5. Calcification at the carotid bifurcations bilaterally, with likely mild to moderate left-sided luminal narrowing. Carotid ultrasound is recommended for further evaluation, when and as deemed clinically appropriate. Critical Value/emergent results were called by telephone at the time of interpretation on 07/19/2016 at 2:51 am to Dr. Deno Singh, who verbally acknowledged these results. Electronically Signed   By: Garald Balding M.D.   On: 07/19/2016 02:52   Ct Cervical Spine Wo Contrast  Result Date: 07/19/2016 CLINICAL DATA:  Tripped and fell, hitting head on coffee table. Loss of consciousness. Headache. Forehead scalp hematoma. Concern for cervical spine injury. Initial encounter. EXAM: CT HEAD WITHOUT CONTRAST CT CERVICAL SPINE WITHOUT CONTRAST TECHNIQUE:  Multidetector CT imaging of the head and cervical spine was performed following the standard protocol without intravenous contrast. Multiplanar CT image reconstructions of the cervical spine were also generated. COMPARISON:  CT of the head performed 06/26/2016, and MRI/MRA of the brain performed 05/20/2009 FINDINGS: CT HEAD FINDINGS Brain: Acute subdural bleed is noted tracking along both sides of the falx cerebri, superiorly and posteriorly. No mass lesion is seen. There is no evidence of acute infarction. There is no evidence of hydrocephalus. Mild cerebellar atrophy is noted. The posterior fossa, including the cerebellum, brainstem and fourth ventricle, is within normal limits. The third and lateral ventricles, and basal ganglia are unremarkable in appearance. The cerebral hemispheres are symmetric in appearance, with normal gray-white differentiation. No mass effect or midline shift is seen. Vascular: No hyperdense vessel or unexpected calcification. Skull: There is no evidence of fracture; visualized osseous structures are unremarkable in appearance. Sinuses/Orbits: The orbits are within normal limits. The paranasal sinuses and mastoid air cells are well-aerated. Other: Mild soft tissue swelling is noted overlying the high frontal calvarium. CT CERVICAL SPINE FINDINGS Alignment: Normal. Skull base and vertebrae: No acute fracture. No primary bone lesion or focal pathologic process. Soft tissues and spinal canal: No prevertebral fluid or swelling. No visible canal hematoma. Disc levels: Scattered anterior and posterior disc osteophyte complexes are noted along the cervical spine. Mild degenerative change is noted about the dens. Intervertebral disc spaces are grossly preserved. Upper chest: The thyroid gland is grossly unremarkable in appearance. The visualized lung apices are clear. Calcification is seen at the carotid bifurcations bilaterally, with likely mild to moderate left-sided luminal narrowing. Other:  No additional soft tissue abnormalities are seen. IMPRESSION: 1. Acute subdural bleed tracking along both sides of the falx cerebri, superiorly and posteriorly. 2. No evidence of fracture or subluxation along the cervical spine. 3. Mild soft tissue swelling overlying the high frontal calvarium. 4. Mild degenerative change along the cervical spine. 5. Calcification at the carotid bifurcations bilaterally, with likely mild to moderate left-sided luminal narrowing. Carotid ultrasound is recommended for further evaluation, when and as deemed clinically appropriate. Critical Value/emergent results were called by telephone at the time of interpretation on 07/19/2016 at 2:51 am to Dr. Deno Singh, who verbally acknowledged these results. Electronically Signed   By: Garald Balding M.D.   On: 07/19/2016 02:52   Dg Shoulder Left  Result Date: 07/19/2016 CLINICAL DATA:  Trip and fall with bilateral shoulder pain. EXAM: LEFT SHOULDER - 2+ VIEW COMPARISON:  None. FINDINGS: There is no evidence of fracture or dislocation. Osteoarthritis of the acromioclavicular and glenohumeral joints. Questionable superior  subluxation of the humeral head versus artifact, suggesting rotator cuff arthropathy. Soft tissues are unremarkable. IMPRESSION: No acute fracture or subluxation of the left shoulder. Electronically Signed   By: Jeb Levering M.D.   On: 07/19/2016 02:30    Procedures Procedures (including critical care time)  Medications Ordered in ED Medications  acetaminophen (TYLENOL) tablet 1,000 mg (1,000 mg Oral Given 07/19/16 0145)  ibuprofen (ADVIL,MOTRIN) tablet 800 mg (800 mg Oral Given 07/19/16 0145)  oxyCODONE (Oxy IR/ROXICODONE) immediate release tablet 2.5 mg (2.5 mg Oral Given 07/19/16 0145)     Initial Impression / Assessment and Plan / ED Course  I have reviewed the triage vital signs and the nursing notes.  Pertinent labs & imaging results that were available during my care of the patient were reviewed by me and  considered in my medical decision making (see chart for details).     76 yo F with a cc of a fall.  Per the family she has a history of difficulty with ambulation and multiple falls in the past. She tried to get up to answer the door without her walker and she tripped and fell and struck her head. Per the family there is a 10 minute period of loss of consciousness. CT of the head with subdural that tracks along the falx. I discussed the case with Dr. Christella Noa, recommended obs period.  Will discuss with hospitalist post labs.   CRITICAL CARE Performed by: Cecilio Asper   Total critical care time: 35 minutes  Critical care time was exclusive of separately billable procedures and treating other patients.  Critical care was necessary to treat or prevent imminent or life-threatening deterioration.  Critical care was time spent personally by me on the following activities: development of treatment plan with patient and/or surrogate as well as nursing, discussions with consultants, evaluation of patient's response to treatment, examination of patient, obtaining history from patient or surrogate, ordering and performing treatments and interventions, ordering and review of laboratory studies, ordering and review of radiographic studies, pulse oximetry and re-evaluation of patient's condition.   Final Clinical Impressions(s) / ED Diagnoses   Final diagnoses:  SDH (subdural hematoma) (HCC)    New Prescriptions New Prescriptions   No medications on file   I personally performed the services described in this documentation, which was scribed in my presence. The recorded information has been reviewed and is accurate.     Cynthia Etienne, DO 07/19/16 0700

## 2016-07-19 NOTE — Progress Notes (Addendum)
VASCULAR LAB PRELIMINARY  PRELIMINARY  PRELIMINARY  PRELIMINARY  Carotid duplex completed.    Preliminary report:  1-39% right ICA stenosis. 40-59% left ICA stenosis.  Vertebral artery flow is antegrade.   Janet Decesare, RVT 07/19/2016, 11:41 AM

## 2016-07-19 NOTE — Consult Note (Signed)
CARDIOLOGY CONSULT NOTE   Patient ID: Cynthia Singh MRN: [id number] DOB/AGE: 10-26-1940 76 y.o.  Admit date: 07/19/2016  Requesting Physician: Dr Aggie Moats Primary Physician:   [name], MD Primary Cardiologist: Dr. Johnsie Cancel Reason for Consultation:  SDH on DAPT  HPI: Cynthia Singh is a 76 y.o. female with a history of CAD s/p RCA stenting (2014) and DES to LAD and POBA to D1 (01/2015), HTN, HLD, uncontrolled IDDM and significant osteoarthritis on chronic narcotics who presented to [facility] on 07/19/16 after a mechanical fall. Found to have bilateral SDH and DAPT discontinued. Cardiology consulted for further recommendations.   She was admitted in 01/2015 for [place]. She underwent cardiac cath on 9/23 which showed 80% prox LAD lesion treated with 3.0 x 20 mm Promus DES, 95% ost D1 lesion treated with balloon angioplasty with 40% residual, 60% mid LCx lesion, patent RCA stent, EF 55-65%. Discharged on ASA and Brilnta. Per cath note, she needed to stay on DAPT for at least 1 year if not longer given her multivessel CAD and uncontrolled DMT2.   She was seen in follow up by Rosaria Ferries PA-C in the office in 03/2015 but has not been seen by [place] since. At that time, she had run out of Brilinta (couldnt afford) so we she was loaded with plavix and then continued on 75mg  daily.   She was in her usual state of health until recently. Patient has had 2 significant falls in the past month: The first was several weeks ago when she tripped over her son's book bag and the second was yesterday when she was walking to the door without her walker and did not see the edge of a coffee table and tripped over this and fell. She has not had any dizziness or weakness upon standing. She has noticed she is more tired with walking significant distances.   In the ER: CT head/neck showed acute subdural bleed tracking along both sides of the falx cerebri, superiorly and posteriorly and carotid artery disease. NA 131, K  5.0, BG 277, WBC 12.4. She was admitted by [facility] and her ASA and plavix have been discontinued.   Currently feeling much better but has a significant HA. No CP or SOB. No LE edema, orthopnea or PND. No dizziness or syncope. No blood in stool or urine. No palpitations. She recently had run out of plavix and was going to need to call our office (can't remember how long has been out but not long).   Past Medical History:  Diagnosis Date  . CAD (coronary artery disease)    a. Remote nonobstructive disease but in 10/2012 Cath/PCI: s/p DES to RCA. b. cath 02/08/15 DES to prox LAD and balloon angioplasty of ost D1, EF 55-65%.  . Diabetes mellitus, type 2 ([place])   . Dyslipidemia   . Episodic mood disorder ([place])   . Hypertension   . Obesity   . Osteoarthritis of spine    knees also     Past Surgical History:  Procedure Laterality Date  . APPENDECTOMY  1959  . CARDIAC CATHETERIZATION N/A 02/08/2015   Procedure: Left Heart Cath and Coronary Angiography;  Surgeon: Sherren Mocha, MD;  Location: [facility];  Service: Cardiovascular;  Laterality: N/A;  . CARDIAC CATHETERIZATION N/A 02/08/2015   Procedure: Coronary Stent Intervention;  Surgeon: Sherren Mocha, MD;  Location: [place] [facility];  Service: Cardiovascular;  Laterality: N/A;  . CARDIAC CATHETERIZATION N/A 02/08/2015   Procedure: Intravascular Pressure Wire/FFR  Study;  Surgeon: Sherren Mocha, MD;  Location: [place] [facility];  Service: Cardiovascular;  Laterality: N/A;  . CHOLECYSTECTOMY    . COMBINED HYSTERECTOMY ABDOMINAL W/ A&P REPAIR / OOPHORECTOMY  1968  . CORONARY STENT PLACEMENT  2014  . [place]  . LEFT HEART CATHETERIZATION WITH CORONARY ANGIOGRAM N/A 10/20/2012   Procedure: LEFT HEART CATHETERIZATION WITH CORONARY ANGIOGRAM;  Surgeon: Sherren Mocha, MD;  Location: [facility];  Service: Cardiovascular;  Laterality: N/A;  . multiple D&C    . TONSILLECTOMY  age 65    No Known Allergies  I have reviewed  the patient's current medications . amitriptyline  150 mg Oral QHS  . atorvastatin  40 mg Oral q1800  . carvedilol  12.5 mg Oral BID WC  . insulin aspart  0-5 Units Subcutaneous QHS  . insulin aspart  0-9 Units Subcutaneous TID WC  . losartan  100 mg Oral Daily  . sodium chloride flush  3 mL Intravenous Q12H   . sodium chloride 75 mL/hr at 07/19/16 0817   acetaminophen **OR** acetaminophen, hydrALAZINE, LORazepam, morphine injection, ondansetron **OR** ondansetron (ZOFRAN) IV, oxyCODONE-acetaminophen **AND** oxyCODONE, polyethylene glycol, senna-docusate  Prior to Admission medications   Medication Sig Start Date End Date Taking? Authorizing Provider  amitriptyline (ELAVIL) 150 MG tablet TAKE ONE TABLET BY MOUTH EVERY NIGHT AT BEDTIME 06/26/16  Yes Venia Carbon, MD  aspirin EC 81 MG EC tablet Take 1 tablet (81 mg total) by mouth daily. 10/21/12  Yes Neena Rhymes, MD  atorvastatin (LIPITOR) 80 MG tablet Take 0.5 tablets (40 mg total) by mouth daily. 02/09/15  Yes Almyra Deforest, PA  carvedilol (COREG) 12.5 MG tablet Take 1 tablet (12.5 mg total) by mouth 2 (two) times daily with a meal. 04/16/16  Yes Venia Carbon, MD  clopidogrel (PLAVIX) 75 MG tablet TAKE 1 TABLET BY MOUTH DAILY 05/21/16  Yes Mihai Croitoru, MD  losartan-hydrochlorothiazide (HYZAAR) 100-12.5 MG tablet Take 1 tablet by mouth daily. 12/06/15  Yes Venia Carbon, MD  metFORMIN (GLUCOPHAGE) 1000 MG tablet TAKE 1 TABLET BY MOUTH TWICE A DAY WITH A MEAL 01/27/16  Yes Venia Carbon, MD  nitroGLYCERIN (NITROSTAT) 0.4 MG SL tablet Place 1 tablet (0.4 mg total) under the tongue every 5 (five) minutes as needed for chest pain. 11/26/12  Yes Lendon Colonel, NP  oxyCODONE-acetaminophen (PERCOCET) 10-325 MG tablet Take 1-2 tablets by mouth every 6 (six) hours as needed for pain. 07/03/16  Yes Venia Carbon, MD  polyethylene glycol [facility] / GLYCOLAX) packet Take 17 g by mouth daily as needed for mild constipation.    Yes Historical  Provider, MD  sennosides-docusate sodium (SENOKOT-S) 8.6-50 MG tablet Take 2 tablets by mouth daily as needed for constipation.   Yes Historical Provider, MD     Social History   Social History  . Marital status: Widowed    Spouse name: N/A  . Number of children: 3  . Years of education: 12   Occupational History  . CASHIER    Social History Main Topics  . Smoking status: Never Smoker  . Smokeless tobacco: Never Used  . Alcohol use No  . Drug use: No  . Sexual activity: No   Other Topics Concern  . Not on file   Social History Narrative   Widowed '13. 2 sons- '63, '66; one daughter-'69;    36 grandchildren--has raised 9 of her grandchildren 4 great grandchildren.   Lives with 2 granddaughters and adoptive son  Has living will   Probably would have son Khora Frangione as health care POA   Would accept resuscitation but no prolonged ventilation   Not sure about tube feeds    Family Status  Relation Status  . Brother Alive  . Mother Deceased at age late 13's   MVA  . Father    biological father unknown  . Daughter    70  . Son    66  . Son    1966  . Neg Hx    Family History  Problem Relation Age of Onset  . Cancer Brother     Colon  . Coronary artery disease Neg Hx   . Heart attack Neg Hx      ROS:  Full 14 point review of systems complete and found to be negative unless listed above.  Physical Exam: Blood pressure (!) 175/76, pulse 74, temperature 98.6 F (37 C), temperature source Oral, resp. rate 16, SpO2 97 %.  General: Well developed, well nourished, female in no acute distress Head: Eyes PERRLA, No xanthomas.   Normocephalic and atraumatic, oropharynx without edema or exudate.  Lungs: CTAB Heart: HRRR S1 S2, no rub/gallop, Heart regular rate and rhythm with S1, S2  no murmur. pulses are 2+ extrem.   Neck: No carotid bruits. No lymphadenopathy. no JVD. Abdomen: Bowel sounds present, abdomen soft and non-tender without masses or hernias  noted. Msk:  No spine or cva tenderness. No weakness, no joint deformities or effusions. Extremities: No clubbing or cyanosis. No LE edema.  Neuro: Alert and oriented X 3. No focal deficits noted. Psych:  Good affect, responds appropriately Skin: No rashes or lesions noted.  Labs:   Lab Results  Component Value Date   WBC 12.4 (H) 07/19/2016   HGB 13.9 07/19/2016   HCT 41.0 07/19/2016   MCV 88.5 07/19/2016   PLT 244 07/19/2016    Recent Labs  07/19/16 0443  INR 0.93    Recent Labs Lab 07/19/16 0443 07/19/16 0510  NA 131* 133*  K 5.0 5.2*  CL 94* 99*  CO2 23  --   BUN 20 27*  CREATININE 0.74 0.80  CALCIUM 9.5  --   GLUCOSE 277* 290*   No results found for: MG No results for input(s): CKTOTAL, CKMB, TROPONINI in the last 72 hours. No results for input(s): TROPIPOC in the last 72 hours. No results found for: PROBNP Lab Results  Component Value Date   CHOL 156 05/08/2015   HDL 31.80 (L) 05/08/2015   LDLCALC UNABLE TO CALCULATE IF TRIGLYCERIDE OVER 400 mg/dL 02/08/2015   TRIG (H) 05/08/2015    543.0 Triglyceride is over 400; calculations on Lipids are invalid.   No results found for: DDIMER Lipase  Date/Time Value Ref Range Status  10/19/2012 08:37 AM 20 11 - 59 U/L Final   TSH  Date/Time Value Ref Range Status  10/19/2012 07:44 PM 1.572 0.350 - 4.500 uIU/mL Final   Vitamin B-12  Date/Time Value Ref Range Status  10/05/2011 01:48 PM 484 211 - 911 pg/mL Final    Echo: 10/20/2012 LV EF: 55% -  60% Study Conclusions - Left ventricle: The cavity size was normal. Wall thickness was increased in a pattern of mild LVH. Systolic function was normal. The estimated ejection fraction was in the range of 55% to 60%. - Right ventricle: RV function is normal to mildly decreased.    Cardiac catheterization 02/08/2015 Conclusion     Dist RCA-1 lesion, 30% stenosed.  Dist RCA-2 lesion,  40% stenosed.  Prox Cx lesion, 30% stenosed.  Prox LAD lesion,  80% stenosed. There is a 0% residual stenosis post intervention.  A drug-eluting stent was placed.  Ost 1st Diag lesion, 95% stenosed. There is a 40% residual stenosis post intervention.  Mid Cx lesion, 60% stenosed.  The left ventricular systolic function is normal.  1. Severe stenosis of the proximal LAD/first diagonal, treated successfully with stenting of the LAD and balloon angioplasty of the first diagonal ostium through the struts of the stented segment 2. Moderate stenosis of the left circumflex, enteric with pressure wire analysis with a normal value of 0.94 at peak hyperemia 3. Wide patency of the stented segment within the right coronary artery and mild nonobstructive disease of the distal vessel 4. Preserved LV systolic function  Recommendations: Dual antiplatelet therapy with aspirin and brilinta for at least 12 months. Would consider long-term DAPT or monotherapy with brilinta in this patient with diabetes and multivessel coronary artery disease.       ECG:  None from this admission    Radiology:  Dg Shoulder Right  Result Date: 07/19/2016 CLINICAL DATA:  Trip and fall with bilateral shoulder pain. EXAM: RIGHT SHOULDER - 2+ VIEW COMPARISON:  None. FINDINGS: There is no evidence of fracture or dislocation. Degenerative change at the acromioclavicular joint. Minimal glenohumeral osteoarthritis. Soft tissues are unremarkable. IMPRESSION: No acute fracture or subluxation of the right shoulder. Electronically Signed   By: Jeb Levering M.D.   On: 07/19/2016 02:28   Ct Head Wo Contrast  Result Date: 07/19/2016 CLINICAL DATA:  Tripped and fell, hitting head on coffee table. Loss of consciousness. Headache. Forehead scalp hematoma. Concern for cervical spine injury. Initial encounter. EXAM: CT HEAD WITHOUT CONTRAST CT CERVICAL SPINE WITHOUT CONTRAST TECHNIQUE: Multidetector CT imaging of the head and cervical spine was performed following the standard protocol without  intravenous contrast. Multiplanar CT image reconstructions of the cervical spine were also generated. COMPARISON:  CT of the head performed 06/26/2016, and MRI/MRA of the brain performed 05/20/2009 FINDINGS: CT HEAD FINDINGS Brain: Acute subdural bleed is noted tracking along both sides of the falx cerebri, superiorly and posteriorly. No mass lesion is seen. There is no evidence of acute infarction. There is no evidence of hydrocephalus. Mild cerebellar atrophy is noted. The posterior fossa, including the cerebellum, brainstem and fourth ventricle, is within normal limits. The third and lateral ventricles, and basal ganglia are unremarkable in appearance. The cerebral hemispheres are symmetric in appearance, with normal gray-white differentiation. No mass effect or midline shift is seen. Vascular: No hyperdense vessel or unexpected calcification. Skull: There is no evidence of fracture; visualized osseous structures are unremarkable in appearance. Sinuses/Orbits: The orbits are within normal limits. The paranasal sinuses and mastoid air cells are well-aerated. Other: Mild soft tissue swelling is noted overlying the high frontal calvarium. CT CERVICAL SPINE FINDINGS Alignment: Normal. Skull base and vertebrae: No acute fracture. No primary bone lesion or focal pathologic process. Soft tissues and spinal canal: No prevertebral fluid or swelling. No visible canal hematoma. Disc levels: Scattered anterior and posterior disc osteophyte complexes are noted along the cervical spine. Mild degenerative change is noted about the dens. Intervertebral disc spaces are grossly preserved. Upper chest: The thyroid gland is grossly unremarkable in appearance. The visualized lung apices are clear. Calcification is seen at the carotid bifurcations bilaterally, with likely mild to moderate left-sided luminal narrowing. Other: No additional soft tissue abnormalities are seen. IMPRESSION: 1. Acute subdural bleed tracking along both sides  of  the falx cerebri, superiorly and posteriorly. 2. No evidence of fracture or subluxation along the cervical spine. 3. Mild soft tissue swelling overlying the high frontal calvarium. 4. Mild degenerative change along the cervical spine. 5. Calcification at the carotid bifurcations bilaterally, with likely mild to moderate left-sided luminal narrowing. Carotid ultrasound is recommended for further evaluation, when and as deemed clinically appropriate. Critical Value/emergent results were called by telephone at the time of interpretation on 07/19/2016 at 2:51 am to Dr. [name], who verbally acknowledged these results. Electronically Signed   By: Garald Balding M.D.   On: 07/19/2016 02:52   Ct Cervical Spine Wo Contrast  Result Date: 07/19/2016 CLINICAL DATA:  Tripped and fell, hitting head on coffee table. Loss of consciousness. Headache. Forehead scalp hematoma. Concern for cervical spine injury. Initial encounter. EXAM: CT HEAD WITHOUT CONTRAST CT CERVICAL SPINE WITHOUT CONTRAST TECHNIQUE: Multidetector CT imaging of the head and cervical spine was performed following the standard protocol without intravenous contrast. Multiplanar CT image reconstructions of the cervical spine were also generated. COMPARISON:  CT of the head performed 06/26/2016, and MRI/MRA of the brain performed 05/20/2009 FINDINGS: CT HEAD FINDINGS Brain: Acute subdural bleed is noted tracking along both sides of the falx cerebri, superiorly and posteriorly. No mass lesion is seen. There is no evidence of acute infarction. There is no evidence of hydrocephalus. Mild cerebellar atrophy is noted. The posterior fossa, including the cerebellum, brainstem and fourth ventricle, is within normal limits. The third and lateral ventricles, and basal ganglia are unremarkable in appearance. The cerebral hemispheres are symmetric in appearance, with normal gray-white differentiation. No mass effect or midline shift is seen. Vascular: No hyperdense vessel  or unexpected calcification. Skull: There is no evidence of fracture; visualized osseous structures are unremarkable in appearance. Sinuses/Orbits: The orbits are within normal limits. The paranasal sinuses and mastoid air cells are well-aerated. Other: Mild soft tissue swelling is noted overlying the high frontal calvarium. CT CERVICAL SPINE FINDINGS Alignment: Normal. Skull base and vertebrae: No acute fracture. No primary bone lesion or focal pathologic process. Soft tissues and spinal canal: No prevertebral fluid or swelling. No visible canal hematoma. Disc levels: Scattered anterior and posterior disc osteophyte complexes are noted along the cervical spine. Mild degenerative change is noted about the dens. Intervertebral disc spaces are grossly preserved. Upper chest: The thyroid gland is grossly unremarkable in appearance. The visualized lung apices are clear. Calcification is seen at the carotid bifurcations bilaterally, with likely mild to moderate left-sided luminal narrowing. Other: No additional soft tissue abnormalities are seen. IMPRESSION: 1. Acute subdural bleed tracking along both sides of the falx cerebri, superiorly and posteriorly. 2. No evidence of fracture or subluxation along the cervical spine. 3. Mild soft tissue swelling overlying the high frontal calvarium. 4. Mild degenerative change along the cervical spine. 5. Calcification at the carotid bifurcations bilaterally, with likely mild to moderate left-sided luminal narrowing. Carotid ultrasound is recommended for further evaluation, when and as deemed clinically appropriate. Critical Value/emergent results were called by telephone at the time of interpretation on 07/19/2016 at 2:51 am to Dr. [name], who verbally acknowledged these results. Electronically Signed   By: Garald Balding M.D.   On: 07/19/2016 02:52   Dg Shoulder Left  Result Date: 07/19/2016 CLINICAL DATA:  Trip and fall with bilateral shoulder pain. EXAM: LEFT SHOULDER - 2+  VIEW COMPARISON:  None. FINDINGS: There is no evidence of fracture or dislocation. Osteoarthritis of the acromioclavicular and glenohumeral joints. Questionable superior subluxation of the  humeral head versus artifact, suggesting rotator cuff arthropathy. Soft tissues are unremarkable. IMPRESSION: No acute fracture or subluxation of the left shoulder. Electronically Signed   By: Jeb Levering M.D.   On: 07/19/2016 02:30    ASSESSMENT AND PLAN:    Principal Problem:   Subdural bleeding (HCC) Active Problems:   Type 2 diabetes, uncontrolled, with neuropathy ([place])   Hyperlipidemia   Essential hypertension   Osteoarthritis of back   Frequent falls   Dehydration with hyponatremia   CAD S/P percutaneous coronary angioplasty/DES (RCA-2014/LAD-2016)  WAYNISHA DIEUDONNE is a 76 y.o. female with a history of CAD s/p RCA stenting (2014) and DES to LAD and POBA to D1 (01/2015), HTN, HLD, uncontrolled IDDM and significant osteoarthritis on chronic narcotics who presented to [facility] on 07/19/16 after a mechanical fall. Found to have bilateral SDH and DAPT discontinued. Cardiology consulted for further recommendations.   SDH after a mechanical fall: she is clear that both falls were mechanical. She did have a brief LOC after hitting her head on the table yesterday. DAPT ( asa/ plavix ) discontinued.   CAD: known CAD with last stent placed in 01/2015. She is over 1 year out from stent placement. Okay to stop DAPT now but would eventually like her to be on at least ASA for secondary prevention. Continue statin and BB  HTN: BP has been uncontrolled. Continue Losartan 100mg  daily, Coreg 12.5mg  BID. Home HCTZ was discontinued given hyponatremia on admission. Will increase Coreg to 25mg  BID (HRs in high 60-70s).   DMT2: uncontrolled. HgA1c 8.0 six months ago. Continue current regimen   Hyperkalemia: K 5.3. Given IVFs. Continue to monitor  Hyponatremia: HCTZ discontinued   Signed: Angelena Form,  PA-C 07/19/2016 9:34 AM  Pager [phone]  Co-Sign MD  The patient was seen, examined and discussed with Nell Range, PA-C and I agree with the above.   Cynthia Singh is a 76 y.o. female with a history of CAD s/p RCA stenting (2014) and DES to LAD and POBA to D1 (01/2015), HTN, HLD, uncontrolled IDDM and significant osteoarthritis on chronic narcotics who presented to [facility] on 07/19/16 after a mechanical fall. She is a patient of Dr Johnsie Cancel, last seen by Rosaria Ferries, PA on 03/25/2015. She presented today after a mechanical fall that resulted in bilateral SDH and DAPT discontinued. Cardiology consulted for further recommendations.  She is currently > 1 year post PCI, it is ok to discontinue Plavix, however Aspirin will have to be restarted once her SDH is stabilized with a follow up scans. Hold for now. It might not be re-started until after discharge.  BP is significantly elevated, on max dose of carvedilol and losartan, I would add amlodipine 2.5 mg po daily. Continue statin and BB   Cynthia Dawley, MD 07/19/2016  . amitriptyline  150 mg Oral QHS  . atorvastatin  40 mg Oral q1800  . carvedilol  25 mg Oral BID WC  . insulin aspart  0-5 Units Subcutaneous QHS  . insulin aspart  0-9 Units Subcutaneous TID WC  . losartan  100 mg Oral Daily  . sodium chloride flush  3 mL Intravenous Q12H

## 2016-07-19 NOTE — ED Triage Notes (Signed)
Per  EMS pt was sitting on couch and attempted to answer door for daughter; pt tripped and fell hitting head on coffee table; daughter able to see pt through window; Pt denies LOC but family states pt was out for aprrox 10 minutes; pt a&ox 4 on arrival. Pt suppose to use walker at home but was non-compliant tonight; Pt c/o bilateral shoulder pain 10/10 and head pain 3/10 on arrival. Per EMS pt has been noncompliant with meds; pt has  Rx for Plavix and HTN med.Pt has hematoma to mid head

## 2016-07-19 NOTE — ED Notes (Signed)
Patient transported to CT 

## 2016-07-19 NOTE — ED Notes (Signed)
ED Provider at bedside. 

## 2016-07-19 NOTE — Progress Notes (Signed)
Patient arrived to unit. Alert verbally pleasant.  No complaints at this time. Breakfast tray ordered.

## 2016-07-20 ENCOUNTER — Observation Stay (HOSPITAL_COMMUNITY): Payer: PPO

## 2016-07-20 DIAGNOSIS — W01190A Fall on same level from slipping, tripping and stumbling with subsequent striking against furniture, initial encounter: Secondary | ICD-10-CM | POA: Diagnosis not present

## 2016-07-20 DIAGNOSIS — M25511 Pain in right shoulder: Secondary | ICD-10-CM | POA: Diagnosis not present

## 2016-07-20 DIAGNOSIS — Z7902 Long term (current) use of antithrombotics/antiplatelets: Secondary | ICD-10-CM | POA: Diagnosis not present

## 2016-07-20 DIAGNOSIS — I6200 Nontraumatic subdural hemorrhage, unspecified: Secondary | ICD-10-CM | POA: Diagnosis not present

## 2016-07-20 DIAGNOSIS — I10 Essential (primary) hypertension: Secondary | ICD-10-CM | POA: Diagnosis not present

## 2016-07-20 DIAGNOSIS — Z7982 Long term (current) use of aspirin: Secondary | ICD-10-CM | POA: Diagnosis not present

## 2016-07-20 DIAGNOSIS — E785 Hyperlipidemia, unspecified: Secondary | ICD-10-CM | POA: Diagnosis not present

## 2016-07-20 DIAGNOSIS — Z7984 Long term (current) use of oral hypoglycemic drugs: Secondary | ICD-10-CM | POA: Diagnosis not present

## 2016-07-20 DIAGNOSIS — R51 Headache: Secondary | ICD-10-CM | POA: Diagnosis not present

## 2016-07-20 DIAGNOSIS — E875 Hyperkalemia: Secondary | ICD-10-CM | POA: Diagnosis not present

## 2016-07-20 DIAGNOSIS — R296 Repeated falls: Secondary | ICD-10-CM | POA: Diagnosis not present

## 2016-07-20 DIAGNOSIS — Z66 Do not resuscitate: Secondary | ICD-10-CM | POA: Diagnosis not present

## 2016-07-20 DIAGNOSIS — E871 Hypo-osmolality and hyponatremia: Secondary | ICD-10-CM | POA: Diagnosis not present

## 2016-07-20 DIAGNOSIS — E860 Dehydration: Secondary | ICD-10-CM | POA: Diagnosis not present

## 2016-07-20 DIAGNOSIS — Y92009 Unspecified place in unspecified non-institutional (private) residence as the place of occurrence of the external cause: Secondary | ICD-10-CM | POA: Diagnosis not present

## 2016-07-20 DIAGNOSIS — M479 Spondylosis, unspecified: Secondary | ICD-10-CM | POA: Diagnosis not present

## 2016-07-20 DIAGNOSIS — Z9861 Coronary angioplasty status: Secondary | ICD-10-CM | POA: Diagnosis not present

## 2016-07-20 DIAGNOSIS — I2510 Atherosclerotic heart disease of native coronary artery without angina pectoris: Secondary | ICD-10-CM | POA: Diagnosis not present

## 2016-07-20 DIAGNOSIS — E1140 Type 2 diabetes mellitus with diabetic neuropathy, unspecified: Secondary | ICD-10-CM | POA: Diagnosis not present

## 2016-07-20 DIAGNOSIS — S065X1A Traumatic subdural hemorrhage with loss of consciousness of 30 minutes or less, initial encounter: Secondary | ICD-10-CM | POA: Diagnosis not present

## 2016-07-20 DIAGNOSIS — M25512 Pain in left shoulder: Secondary | ICD-10-CM | POA: Diagnosis not present

## 2016-07-20 DIAGNOSIS — Z955 Presence of coronary angioplasty implant and graft: Secondary | ICD-10-CM | POA: Diagnosis not present

## 2016-07-20 DIAGNOSIS — E1165 Type 2 diabetes mellitus with hyperglycemia: Secondary | ICD-10-CM | POA: Diagnosis not present

## 2016-07-20 LAB — CBC
HCT: 36.7 % (ref 36.0–46.0)
Hemoglobin: 12.2 g/dL (ref 12.0–15.0)
MCH: 29.9 pg (ref 26.0–34.0)
MCHC: 33.2 g/dL (ref 30.0–36.0)
MCV: 90 fL (ref 78.0–100.0)
Platelets: 240 10*3/uL (ref 150–400)
RBC: 4.08 MIL/uL (ref 3.87–5.11)
RDW: 12.8 % (ref 11.5–15.5)
WBC: 11.7 10*3/uL — ABNORMAL HIGH (ref 4.0–10.5)

## 2016-07-20 LAB — COMPREHENSIVE METABOLIC PANEL
ALT: 12 U/L — ABNORMAL LOW (ref 14–54)
AST: 15 U/L (ref 15–41)
Albumin: 3.6 g/dL (ref 3.5–5.0)
Alkaline Phosphatase: 93 U/L (ref 38–126)
Anion gap: 7 (ref 5–15)
BUN: 17 mg/dL (ref 6–20)
CO2: 23 mmol/L (ref 22–32)
Calcium: 8.7 mg/dL — ABNORMAL LOW (ref 8.9–10.3)
Chloride: 104 mmol/L (ref 101–111)
Creatinine, Ser: 1.12 mg/dL — ABNORMAL HIGH (ref 0.44–1.00)
GFR calc Af Amer: 55 mL/min — ABNORMAL LOW (ref >60)
GFR calc non Af Amer: 48 mL/min — ABNORMAL LOW (ref >60)
Glucose, Bld: 243 mg/dL — ABNORMAL HIGH (ref 65–99)
Potassium: 4.3 mmol/L (ref 3.5–5.1)
Sodium: 134 mmol/L — ABNORMAL LOW (ref 135–145)
Total Bilirubin: 0.7 mg/dL (ref 0.3–1.2)
Total Protein: 6.6 g/dL (ref 6.5–8.1)

## 2016-07-20 LAB — GLUCOSE, CAPILLARY
Glucose-Capillary: 223 mg/dL — ABNORMAL HIGH (ref 65–99)
Glucose-Capillary: 169 mg/dL — ABNORMAL HIGH (ref 65–99)
Glucose-Capillary: 148 mg/dL — ABNORMAL HIGH (ref 65–99)
Glucose-Capillary: 215 mg/dL — ABNORMAL HIGH (ref 65–99)

## 2016-07-20 LAB — HEMOGLOBIN A1C
Hgb A1c MFr Bld: 9.1 % — ABNORMAL HIGH (ref 4.8–5.6)
Mean Plasma Glucose: 214 mg/dL

## 2016-07-20 MED ORDER — INSULIN GLARGINE 100 UNIT/ML ~~LOC~~ SOLN
17.0000 [IU] | Freq: Every day | SUBCUTANEOUS | Status: DC
  Administered 2016-07-20 – 2016-07-21 (×2): 17 [IU] via SUBCUTANEOUS
  Filled 2016-07-20 (×2): qty 0.17

## 2016-07-20 NOTE — Progress Notes (Signed)
Patient Name: Cynthia Singh Date of Encounter: 07/20/2016  Primary Cardiologist: Dr. Debbe Odea Problem List     Principal Problem:   Subdural bleeding [facility]) Active Problems:   Type 2 diabetes, uncontrolled, with neuropathy ([place])   Hyperlipidemia   Essential hypertension   Osteoarthritis of back   Frequent falls   Dehydration with hyponatremia   CAD S/P percutaneous coronary angioplasty/DES (RCA-2014/LAD-2016)     Subjective   No complaint of CP or SOB  Inpatient Medications    Scheduled Meds: . amitriptyline  150 mg Oral QHS  . atorvastatin  40 mg Oral q1800  . carvedilol  25 mg Oral BID WC  . insulin aspart  0-5 Units Subcutaneous QHS  . insulin aspart  0-9 Units Subcutaneous TID WC  . insulin glargine  17 Units Subcutaneous Daily  . losartan  100 mg Oral Daily  . sodium chloride flush  3 mL Intravenous Q12H   Continuous Infusions:  PRN Meds: acetaminophen **OR** acetaminophen, cyclobenzaprine, hydrALAZINE, LORazepam, ondansetron **OR** ondansetron (ZOFRAN) IV, oxyCODONE-acetaminophen **AND** oxyCODONE, polyethylene glycol, senna-docusate   Vital Signs    Vitals:   07/20/16 0833 07/20/16 1000 07/20/16 1208 07/20/16 1209  BP: 138/64 (!) 102/48 94/73 (!) 115/53  Pulse: 64 (!) 56 (!) 58 (!) 56  Resp:  20    Temp:  (!) 95.5 F (35.3 C) 98 F (36.7 C)   TempSrc:  Oral    SpO2:  96%      Intake/Output Summary (Last 24 hours) at 07/20/16 1254 Last data filed at 07/20/16 0432  Gross per 24 hour  Intake             1000 ml  Output                0 ml  Net             1000 ml   There were no vitals filed for this visit.  Physical Exam    GEN: Well nourished, well developed, in no acute distress.  HEENT: Grossly normal.  Neck: Supple, no JVD, carotid bruits, or masses. Cardiac: RRR, no murmurs, rubs, or gallops. No clubbing, cyanosis, edema.  Radials/DP/PT 2+ and equal bilaterally.  Respiratory:  Respirations regular and unlabored, clear to  auscultation bilaterally. GI: Soft, nontender, nondistended, BS + x 4. MS: no deformity or atrophy. Skin: warm and dry, no rash. Neuro:  Strength and sensation are intact. Psych: AAOx3.  Normal affect.  Labs    CBC  Recent Labs  07/19/16 0443 07/19/16 0510 07/20/16 0608  WBC 12.4*  --  11.7*  NEUTROABS 8.9*  --   --   HGB 13.7 13.9 12.2  HCT 39.9 41.0 36.7  MCV 88.5  --  90.0  PLT 244  --  [id number]   Basic Metabolic Panel  Recent Labs  07/19/16 0930 07/19/16 1411 07/20/16 0608  NA  --  133* 134*  K  --  4.2 4.3  CL  --  100* 104  CO2  --  24 23  GLUCOSE  --  285* 243*  BUN  --  15 17  CREATININE  --  0.84 1.12*  CALCIUM  --  9.2 8.7*  MG 2.1  --   --    Liver Function Tests  Recent Labs  07/20/16 0608  AST 15  ALT 12*  ALKPHOS 93  BILITOT 0.7  PROT 6.6  ALBUMIN 3.6   No results for input(s): LIPASE, AMYLASE in the last 72 hours.  Cardiac Enzymes No results for input(s): CKTOTAL, CKMB, CKMBINDEX, TROPONINI in the last 72 hours. BNP Invalid input(s): POCBNP D-Dimer No results for input(s): DDIMER in the last 72 hours. Hemoglobin A1C  Recent Labs  07/19/16 0718  HGBA1C 9.1*   Fasting Lipid Panel No results for input(s): CHOL, HDL, LDLCALC, TRIG, CHOLHDL, LDLDIRECT in the last 72 hours. Thyroid Function Tests No results for input(s): TSH, T4TOTAL, T3FREE, THYROIDAB in the last 72 hours.  Invalid input(s): FREET3  Telemetry    NSR - Personally Reviewed  ECG    NSR - Personally Reviewed  Radiology    Dg Shoulder Right  Result Date: 07/19/2016 CLINICAL DATA:  Trip and fall with bilateral shoulder pain. EXAM: RIGHT SHOULDER - 2+ VIEW COMPARISON:  None. FINDINGS: There is no evidence of fracture or dislocation. Degenerative change at the acromioclavicular joint. Minimal glenohumeral osteoarthritis. Soft tissues are unremarkable. IMPRESSION: No acute fracture or subluxation of the right shoulder. Electronically Signed   By: Jeb Levering M.D.    On: 07/19/2016 02:28   Ct Head Wo Contrast  Result Date: 07/20/2016 CLINICAL DATA:  76 year old female with history of subdural hematoma. Headache since waking up this morning. EXAM: CT HEAD WITHOUT CONTRAST TECHNIQUE: Contiguous axial images were obtained from the base of the skull through the vertex without intravenous contrast. COMPARISON:  07/19/2016. FINDINGS: Brain: Again noted is high attenuation material all noted lying adjacent to the falx cerebri, predominantly on the left side. This has increased compared to the prior study, measuring up to a maximal thickness of 9 mm (coronal image 38 of series 203). This continues to layer slightly along the left side of the tentorium cerebelli. No significant mass effect. No intraparenchymal hemorrhage. No hydrocephalus. Patchy and confluent areas of decreased attenuation are noted throughout the deep and periventricular white matter of the cerebral hemispheres bilaterally, compatible with chronic microvascular ischemic disease. Vascular: No hyperdense vessel or unexpected calcification. Skull: Normal. Negative for fracture or focal lesion. Sinuses/Orbits: No acute finding. Other: None. IMPRESSION: 1. Slight increase in size of left-sided subdural hematoma lying predominantly along the falx cerebri with some layering along the left tentorium cerebelli. This currently measures up to 9 mm in maximal thickness. Electronically Signed   By: Vinnie Langton M.D.   On: 07/20/2016 09:00   Ct Head Wo Contrast  Result Date: 07/19/2016 CLINICAL DATA:  Tripped and fell, hitting head on coffee table. Loss of consciousness. Headache. Forehead scalp hematoma. Concern for cervical spine injury. Initial encounter. EXAM: CT HEAD WITHOUT CONTRAST CT CERVICAL SPINE WITHOUT CONTRAST TECHNIQUE: Multidetector CT imaging of the head and cervical spine was performed following the standard protocol without intravenous contrast. Multiplanar CT image reconstructions of the cervical spine  were also generated. COMPARISON:  CT of the head performed 06/26/2016, and MRI/MRA of the brain performed 05/20/2009 FINDINGS: CT HEAD FINDINGS Brain: Acute subdural bleed is noted tracking along both sides of the falx cerebri, superiorly and posteriorly. No mass lesion is seen. There is no evidence of acute infarction. There is no evidence of hydrocephalus. Mild cerebellar atrophy is noted. The posterior fossa, including the cerebellum, brainstem and fourth ventricle, is within normal limits. The third and lateral ventricles, and basal ganglia are unremarkable in appearance. The cerebral hemispheres are symmetric in appearance, with normal gray-white differentiation. No mass effect or midline shift is seen. Vascular: No hyperdense vessel or unexpected calcification. Skull: There is no evidence of fracture; visualized osseous structures are unremarkable in appearance. Sinuses/Orbits: The orbits are within normal limits. The  paranasal sinuses and mastoid air cells are well-aerated. Other: Mild soft tissue swelling is noted overlying the high frontal calvarium. CT CERVICAL SPINE FINDINGS Alignment: Normal. Skull base and vertebrae: No acute fracture. No primary bone lesion or focal pathologic process. Soft tissues and spinal canal: No prevertebral fluid or swelling. No visible canal hematoma. Disc levels: Scattered anterior and posterior disc osteophyte complexes are noted along the cervical spine. Mild degenerative change is noted about the dens. Intervertebral disc spaces are grossly preserved. Upper chest: The thyroid gland is grossly unremarkable in appearance. The visualized lung apices are clear. Calcification is seen at the carotid bifurcations bilaterally, with likely mild to moderate left-sided luminal narrowing. Other: No additional soft tissue abnormalities are seen. IMPRESSION: 1. Acute subdural bleed tracking along both sides of the falx cerebri, superiorly and posteriorly. 2. No evidence of fracture or  subluxation along the cervical spine. 3. Mild soft tissue swelling overlying the high frontal calvarium. 4. Mild degenerative change along the cervical spine. 5. Calcification at the carotid bifurcations bilaterally, with likely mild to moderate left-sided luminal narrowing. Carotid ultrasound is recommended for further evaluation, when and as deemed clinically appropriate. Critical Value/emergent results were called by telephone at the time of interpretation on 07/19/2016 at 2:51 am to Dr. [name], who verbally acknowledged these results. Electronically Signed   By: Garald Balding M.D.   On: 07/19/2016 02:52   Ct Cervical Spine Wo Contrast  Result Date: 07/19/2016 CLINICAL DATA:  Tripped and fell, hitting head on coffee table. Loss of consciousness. Headache. Forehead scalp hematoma. Concern for cervical spine injury. Initial encounter. EXAM: CT HEAD WITHOUT CONTRAST CT CERVICAL SPINE WITHOUT CONTRAST TECHNIQUE: Multidetector CT imaging of the head and cervical spine was performed following the standard protocol without intravenous contrast. Multiplanar CT image reconstructions of the cervical spine were also generated. COMPARISON:  CT of the head performed 06/26/2016, and MRI/MRA of the brain performed 05/20/2009 FINDINGS: CT HEAD FINDINGS Brain: Acute subdural bleed is noted tracking along both sides of the falx cerebri, superiorly and posteriorly. No mass lesion is seen. There is no evidence of acute infarction. There is no evidence of hydrocephalus. Mild cerebellar atrophy is noted. The posterior fossa, including the cerebellum, brainstem and fourth ventricle, is within normal limits. The third and lateral ventricles, and basal ganglia are unremarkable in appearance. The cerebral hemispheres are symmetric in appearance, with normal gray-white differentiation. No mass effect or midline shift is seen. Vascular: No hyperdense vessel or unexpected calcification. Skull: There is no evidence of fracture;  visualized osseous structures are unremarkable in appearance. Sinuses/Orbits: The orbits are within normal limits. The paranasal sinuses and mastoid air cells are well-aerated. Other: Mild soft tissue swelling is noted overlying the high frontal calvarium. CT CERVICAL SPINE FINDINGS Alignment: Normal. Skull base and vertebrae: No acute fracture. No primary bone lesion or focal pathologic process. Soft tissues and spinal canal: No prevertebral fluid or swelling. No visible canal hematoma. Disc levels: Scattered anterior and posterior disc osteophyte complexes are noted along the cervical spine. Mild degenerative change is noted about the dens. Intervertebral disc spaces are grossly preserved. Upper chest: The thyroid gland is grossly unremarkable in appearance. The visualized lung apices are clear. Calcification is seen at the carotid bifurcations bilaterally, with likely mild to moderate left-sided luminal narrowing. Other: No additional soft tissue abnormalities are seen. IMPRESSION: 1. Acute subdural bleed tracking along both sides of the falx cerebri, superiorly and posteriorly. 2. No evidence of fracture or subluxation along the cervical spine. 3.  Mild soft tissue swelling overlying the high frontal calvarium. 4. Mild degenerative change along the cervical spine. 5. Calcification at the carotid bifurcations bilaterally, with likely mild to moderate left-sided luminal narrowing. Carotid ultrasound is recommended for further evaluation, when and as deemed clinically appropriate. Critical Value/emergent results were called by telephone at the time of interpretation on 07/19/2016 at 2:51 am to Dr. [name], who verbally acknowledged these results. Electronically Signed   By: Garald Balding M.D.   On: 07/19/2016 02:52   Dg Shoulder Left  Result Date: 07/19/2016 CLINICAL DATA:  Trip and fall with bilateral shoulder pain. EXAM: LEFT SHOULDER - 2+ VIEW COMPARISON:  None. FINDINGS: There is no evidence of fracture or  dislocation. Osteoarthritis of the acromioclavicular and glenohumeral joints. Questionable superior subluxation of the humeral head versus artifact, suggesting rotator cuff arthropathy. Soft tissues are unremarkable. IMPRESSION: No acute fracture or subluxation of the left shoulder. Electronically Signed   By: Jeb Levering M.D.   On: 07/19/2016 02:30    Cardiac Studies   none  Patient Profile     Cynthia Singh is a 76 y.o. female with a history of CAD s/p RCA stenting (2014) and DES to LAD and POBA to D1 (01/2015), HTN, HLD, uncontrolled IDDM and significant osteoarthritis on chronic narcotics who presented to [facility] on 07/19/16 after a mechanical fall. Found to have bilateral SDH and DAPT discontinued. Cardiology consulted for further recommendations.    Assessment & Plan    SDH after a mechanical fall: she is clear that both falls were mechanical. She did have a brief LOC after hitting her head on the table yesterday. DAPT ( asa/ plavix ) discontinued.   CAD: known CAD with last stent placed in 01/2015. She is currently > 1 year post PCI, it is ok to discontinue Plavix, however Aspirin will have to be restarted once her SDH is stabilized with a follow up scans. Hold for now. It might not be re-started until after discharge. Continue statin and BB  HTN: BP much better controlled after increasing Coreg. Continue Losartan 100mg  daily, Coreg 25mg  BID. Home HCTZ was discontinued given hyponatremia on admission.   DMT2: uncontrolled. HgA1c 8.0 six months ago. Continue current regimen   Hyperkalemia: resolved.  K+4.3  Hyponatremia: HCTZ discontinued.  Na 134.  No new recs at this time.  Will sign of.  Call with any questions.  Please have her followup in OV with Dr. Johnsie Cancel on discharge.  Signed, Fransico Him, MD  07/20/2016, 12:54 PM

## 2016-07-20 NOTE — Evaluation (Addendum)
Occupational Therapy Evaluation Patient Details Name: Cynthia Singh MRN: [id number] DOB: 1940-05-29 Today's Date: 07/20/2016    History of Present Illness Pt is a 76 y/o female admitted secondary to a fall resulting in L falx cerebri subdural hematoma. PMH includes falls, DM, HTN, CAD s/p RCA stenting, and osteoarthritis (LLE>RLE).    Clinical Impression   PTA, pt reports independence with ADL and functional mobility and was working. She reports that her children tell her to use her RW but she does not use it. She currently requires min assist for toilet transfers and while standing at sink for ADL. She demonstrated difficulty problem solving and navigating around furniture in hospital room. Feel she would benefit from continued OT services while admitted and home health services post-acute D/C. Initiated education concerning precautions and safety with ADL tasks. Will continue to follow acutely.    Follow Up Recommendations  Home health OT;Supervision/Assistance - 24 hour    Equipment Recommendations  3 in 1 bedside commode    Recommendations for Other Services       Precautions / Restrictions Precautions Precautions: Fall Precaution Comments: History of 2 recent falls.  Restrictions Weight Bearing Restrictions: No      Mobility Bed Mobility Overal bed mobility: Needs Assistance Bed Mobility: Supine to Sit     Supine to sit: Mod assist Sit to supine: Min assist   General bed mobility comments: Mod A for trunk elevation secondary to reports of soreness in B shoulders.   Transfers Overall transfer level: Needs assistance Equipment used: Rolling walker (2 wheeled) Transfers: Sit to/from Stand Sit to Stand: Min guard         General transfer comment: Verbal cues to power through legs. Min guard for stability.     Balance Overall balance assessment: Needs assistance Sitting-balance support: No upper extremity supported;Feet unsupported Sitting balance-Leahy Scale:  Poor Sitting balance - Comments: Demonstrated L Lateral lean upon sitting. Verbal cues to adjust to upright posture. Able to self correct upon cuing.  Postural control: Left lateral lean Standing balance support: No upper extremity supported Standing balance-Leahy Scale: Fair Standing balance comment: Static standing                            ADL Overall ADL's : Needs assistance/impaired Eating/Feeding: Set up;Sitting   Grooming: Min guard;Standing   Upper Body Bathing: Supervision/ safety;Sitting   Lower Body Bathing: Minimal assistance;Sit to/from stand   Upper Body Dressing : Supervision/safety;Sitting   Lower Body Dressing: Minimal assistance;Sit to/from stand   Toilet Transfer: Minimal Horticulturist, commercial and Hygiene: Min guard;Sit to/from stand       Functional mobility during ADLs: Minimal assistance;Rolling walker General ADL Comments: Noted decreased awareness with RW this session.     Vision Baseline Vision/History: Wears glasses Wears Glasses: Reading only Patient Visual Report: Blurring of vision Vision Assessment?: Yes Eye Alignment: Within Functional Limits Ocular Range of Motion: Within Functional Limits Alignment/Gaze Preference: Within Defined Limits Tracking/Visual Pursuits: Decreased smoothness of horizontal tracking;Decreased smoothness of vertical tracking Saccades: Within functional limits Visual Fields: No apparent deficits Additional Comments: Reports blurred vision with close and far gaze. Light sensitive. Will continue to assess.     Perception     Praxis      Pertinent Vitals/Pain Pain Assessment: Faces Faces Pain Scale: Hurts whole lot Pain Location: Headache; B shoulders Pain Descriptors / Indicators: Headache;Grimacing;Sore Pain Intervention(s): Limited activity within patient's tolerance;Monitored during session;Repositioned (RN present;  states she would give meds  when able)     Hand Dominance Right   Extremity/Trunk Assessment Upper Extremity Assessment Upper Extremity Assessment: Defer to OT evaluation   Lower Extremity Assessment Lower Extremity Assessment: RLE deficits/detail;LLE deficits/detail RLE Deficits / Details: RLE grossly 4/5 throughout muscle groups.  LLE Deficits / Details: LLE weaker than RLE. Pt states it is due to osteoarthritis in her knee. Grossly 3+/5 throughout.    Cervical / Trunk Assessment Cervical / Trunk Assessment: Kyphotic   Communication Communication Communication: No difficulties   Cognition Arousal/Alertness: Awake/alert Behavior During Therapy: WFL for tasks assessed/performed Overall Cognitive Status: No family/caregiver present to determine baseline cognitive functioning                 General Comments: Demonstrated slow processing and difficulty with problem solving. Required increased time to follow commands. May be due to headache.    General Comments  Min guard for toileting activities this session. Demonstrated slight instability with toileting tasks, however, did not demonstrate LOB. Pt limited this session by reports of sensitivity to light and wooziness. Unable to tolerate further mobility secondary to headache and previous reported symptoms. RN notified.     Exercises       Shoulder Instructions      Home Living Family/patient expects to be discharged to:: Private residence Living Arrangements: Children (Daughter and adopted son) Available Help at Discharge: Family;Available 24 hours/day Type of Home: House Home Access: Ramped entrance     Home Layout: One level     Bathroom Shower/Tub: Occupational psychologist: Handicapped height     Home Equipment: Grab bars - tub/shower;Hand held Tourist information centre manager - 2 wheels;Tub bench          Prior Functioning/Environment Level of Independence: Independent        Comments: Has RW and children think she should use it  more. Reports she uses RW only when she feels unsteady.         OT Problem List: Decreased strength;Decreased activity tolerance;Impaired balance (sitting and/or standing);Decreased safety awareness;Decreased knowledge of use of DME or AE;Decreased knowledge of precautions;Pain      OT Treatment/Interventions: Self-care/ADL training;Therapeutic exercise;Energy conservation;DME and/or AE instruction;Therapeutic activities;Visual/perceptual remediation/compensation;Cognitive remediation/compensation;Patient/family education;Balance training    OT Goals(Current goals can be found in the care plan section) Acute Rehab OT Goals Patient Stated Goal: to have less headache OT Goal Formulation: With patient Time For Goal Achievement: 08/03/16 Potential to Achieve Goals: Good ADL Goals Pt Will Perform Grooming: with modified independence;standing Pt Will Transfer to Toilet: with modified independence;ambulating;bedside commode (BSC over toilet) Pt Will Perform Toileting - Clothing Manipulation and hygiene: with modified independence;sit to/from stand Pt Will Perform Tub/Shower Transfer: with modified independence;3 in 1;rolling walker;ambulating;Shower transfer Additional ADL Goal #1: Pt will complete multi-step ADL routine with no VC's for problem solving.  OT Frequency: Min 2X/week   Barriers to D/C:            Co-evaluation              End of Session Equipment Utilized During Treatment: Gait belt;Rolling walker Nurse Communication: Mobility status  Activity Tolerance: Patient tolerated treatment well Patient left: in bed;with nursing/sitter in room (Sitting at EOB receiving meds, preparing to go to CT)  OT Visit Diagnosis: Unsteadiness on feet (R26.81);Low vision, both eyes (H54.2)                ADL either performed or assessed with clinical judgement  Time: 0810-0833 OT Time Calculation (  min): 23 min Charges:  OT General Charges $OT Visit: 1 Procedure OT Evaluation $OT  Eval Moderate Complexity: 1 Procedure OT Treatments $Self Care/Home Management : 8-22 mins G-Codes: OT G-codes **NOT FOR INPATIENT CLASS** Functional Assessment Tool Used: AM-PAC 6 Clicks Daily Activity Functional Limitation: Self care Self Care Current Status [id number]): At least 20 percent but less than 40 percent impaired, limited or restricted Self Care Goal Status [id number]): At least 1 percent but less than 20 percent impaired, limited or restricted   Norman Herrlich, [place] OTR/L  Pager: [place] 07/20/2016, 12:55 PM

## 2016-07-20 NOTE — Care Management Note (Signed)
Case Management Note  Patient Details  Name: Cynthia Singh MRN: [id number] Date of Birth: June 06, 1940  Subjective/Objective:             Patient presented with bilateral SDH s/p fall. Lives at home with family. CM will follow for discharge needs pending PT/OT evals and physician orders.       Action/Plan:   Expected Discharge Date:                  Expected Discharge Plan:     In-House Referral:     Discharge planning Services     Post Acute Care Choice:    Choice offered to:     DME Arranged:    DME Agency:     HH Arranged:    HH Agency:     Status of Service:     If discussed at [employer] of Stay Meetings, dates discussed:    Additional Comments:  Rolm Baptise, RN 07/20/2016, 10:35 AM

## 2016-07-20 NOTE — Evaluation (Signed)
Physical Therapy Evaluation Patient Details Name: Cynthia Singh MRN: [id number] DOB: 1940/10/24 Today's Date: 07/20/2016   History of Present Illness  Pt is a 76 y/o female admitted secondary to a fall resulting in L falx cerebri subdural hematoma. PMH includes falls, DM, HTN, CAD s/p RCA stenting, and osteoarthritis (LLE>RLE).   Clinical Impression  Pt admitted with above diagnosis. Pt currently with functional limitations due to the deficits listed below (see PT Problem List). Prior to admission, pt was independent with all mobility and use RW occasionally. Upon evaluation, pt demonstrating headache, reports of "wooziness", and sensitivity to light which limited mobility tolerance. Pt ambulated with min guard assist secondary to decreased balance and strength. Pt required mod A for bed mobility secondary to reports of soreness in shoulders. Pt reports her daughter and other children will be able to assist as needed upon return home. Pt has all necessary DME at home. Recommending HHPT upon d/c.  Pt will benefit from skilled PT to increase their independence and safety with mobility to allow discharge to the venue listed below. Will continue to follow.      Follow Up Recommendations Home health PT;Supervision/Assistance - 24 hour    Equipment Recommendations  None recommended by PT    Recommendations for Other Services       Precautions / Restrictions Precautions Precautions: Fall Precaution Comments: History of 2 recent falls.  Restrictions Weight Bearing Restrictions: No      Mobility  Bed Mobility Overal bed mobility: Needs Assistance Bed Mobility: Supine to Sit     Supine to sit: Mod assist Sit to supine: Min assist   General bed mobility comments: Mod A for trunk elevation secondary to reports of soreness in B shoulders.   Transfers Overall transfer level: Needs assistance Equipment used: Rolling walker (2 wheeled) Transfers: Sit to/from Stand Sit to Stand: Min  guard         General transfer comment: Verbal cues to power through legs. Min guard for stability.   Ambulation/Gait Ambulation/Gait assistance: Min guard Ambulation Distance (Feet): 25 Feet Assistive device: Rolling walker (2 wheeled) Gait Pattern/deviations: Step-through pattern;Decreased stride length;Narrow base of support;Drifts right/left Gait velocity: Decreased.  Gait velocity interpretation: Below normal speed for age/gender General Gait Details: Slow, guarded gait. Verbal cues for appropriate upright posture and appropriate use of RW during gait.  Pt reports sensistivity to light and feelings of "swimmy-headedness" during gait. Distance limited secondary to headache and wooziness. Requested to return to bed.   Stairs            Wheelchair Mobility    Modified Rankin (Stroke Patients Only)       Balance Overall balance assessment: Needs assistance Sitting-balance support: No upper extremity supported;Feet unsupported Sitting balance-Leahy Scale: Poor Sitting balance - Comments: Demonstrated L Lateral lean upon sitting. Verbal cues to adjust to upright posture. Able to self correct upon cuing.  Postural control: Left lateral lean Standing balance support: No upper extremity supported Standing balance-Leahy Scale: Fair Standing balance comment: Static standing                             Pertinent Vitals/Pain Pain Assessment: Faces Faces Pain Scale: Hurts whole lot Pain Location: Headache; B shoulders Pain Descriptors / Indicators: Headache;Grimacing;Sore Pain Intervention(s): Limited activity within patient's tolerance;Monitored during session;Repositioned (RN present; states she would give meds when able)    Home Living Family/patient expects to be discharged to:: Private residence Living Arrangements: Children (Daughter  and adopted son) Available Help at Discharge: Family;Available 24 hours/day Type of Home: House Home Access: Ramped  entrance     Home Layout: One level Home Equipment: Grab bars - tub/shower;Hand held shower head;Walker - 2 wheels;Tub bench      Prior Function Level of Independence: Independent         Comments: Has RW and children think she should use it more. Reports she uses RW only when she feels unsteady.      Hand Dominance   Dominant Hand: Right    Extremity/Trunk Assessment   Upper Extremity Assessment Upper Extremity Assessment: Defer to OT evaluation    Lower Extremity Assessment Lower Extremity Assessment: RLE deficits/detail;LLE deficits/detail RLE Deficits / Details: RLE grossly 4/5 throughout muscle groups.  LLE Deficits / Details: LLE weaker than RLE. Pt states it is due to osteoarthritis in her knee. Grossly 3+/5 throughout.     Cervical / Trunk Assessment Cervical / Trunk Assessment: Kyphotic  Communication   Communication: No difficulties  Cognition Arousal/Alertness: Awake/alert Behavior During Therapy: WFL for tasks assessed/performed Overall Cognitive Status: No family/caregiver present to determine baseline cognitive functioning                 General Comments: Demonstrated slow processing and difficulty with problem solving. Required increased time to follow commands. May be due to headache.     General Comments General comments (skin integrity, edema, etc.): Min guard for toileting activities this session. Demonstrated slight instability with toileting tasks, however, did not demonstrate LOB. Pt limited this session by reports of sensitivity to light and wooziness. Unable to tolerate further mobility secondary to headache and previous reported symptoms. RN notified.     Exercises     Assessment/Plan    PT Assessment Patient needs continued PT services  PT Problem List Decreased strength;Decreased activity tolerance;Decreased balance;Decreased mobility;Decreased cognition;Decreased knowledge of use of DME;Decreased safety awareness;Pain;Decreased  coordination       PT Treatment Interventions Gait training;DME instruction;Stair training;Functional mobility training;Therapeutic activities;Therapeutic exercise;Balance training;Neuromuscular re-education;Patient/family education    PT Goals (Current goals can be found in the Care Plan section)  Acute Rehab PT Goals Patient Stated Goal: to have less headache PT Goal Formulation: With patient Time For Goal Achievement: 08/03/16 Potential to Achieve Goals: Good    Frequency Min 3X/week   Barriers to discharge        Co-evaluation               End of Session Equipment Utilized During Treatment: Gait belt Activity Tolerance: Patient limited by pain;Patient limited by fatigue;Other (comment) (Limited by "wooziness") Patient left: in bed;with call bell/phone within reach;with bed alarm set Nurse Communication: Mobility status PT Visit Diagnosis: Unsteadiness on feet (R26.81);Muscle weakness (generalized) (M62.81);Pain Pain - part of body:  (headache)    Functional Assessment Tool Used: AM-PAC 6 Clicks Basic Mobility;Clinical judgement Functional Limitation: Mobility: Walking and moving around Mobility: Walking and Moving Around Current Status [id number]): At least 40 percent but less than 60 percent impaired, limited or restricted Mobility: Walking and Moving Around Goal Status [phone]): At least 1 percent but less than 20 percent impaired, limited or restricted    Time: [id number] PT Time Calculation (min) (ACUTE ONLY): 29 min   Charges:   PT Evaluation $PT Eval Moderate Complexity: 1 Procedure PT Treatments $Gait Training: 8-22 mins   PT G Codes:   PT G-Codes **NOT FOR INPATIENT CLASS** Functional Assessment Tool Used: AM-PAC 6 Clicks Basic Mobility;Clinical judgement Functional Limitation: Mobility: Walking and moving around  Mobility: Walking and Moving Around Current Status [phone]): At least 40 percent but less than 60 percent impaired, limited or  restricted Mobility: Walking and Moving Around Goal Status [phone]): At least 1 percent but less than 20 percent impaired, limited or restricted     Mamie Levers 07/20/2016, 12:39 PM  Nicky Pugh, PT, DPT  Acute Rehabilitation Services  Pager: [phone]

## 2016-07-20 NOTE — Progress Notes (Signed)
PROGRESS NOTE    Cynthia Singh  [id number] DOB: 03-26-1941 DOA: 07/19/2016 PCP: [name], MD   Outpatient Specialists:     Brief Narrative:  Cynthia Singh is a 76 y.o. female with medical history significant for diabetes mellitus on metformin with poor control, hypertension and dyslipidemia, significant osteoarthritis on chronic narcotics. Patient has had 2 significant falls in the past month: The first was several weeks ago when she tripped over her son's book bag and the second was yesterday when she was walking to the door without her walker and did not see the edge of a coffee table and tripped over this and fell. She has not had any dizziness or weakness upon standing. She has noticed she is more tired with walking significant distances.    Assessment & Plan:   Principal Problem:   Subdural bleeding (HCC) Active Problems:   Type 2 diabetes, uncontrolled, with neuropathy (HCC)   Hyperlipidemia   Essential hypertension   Osteoarthritis of back   Frequent falls   Dehydration with hyponatremia   CAD S/P percutaneous coronary angioplasty/DES (RCA-2014/LAD-2016)   Subdural bleeding  -Patient presents after sustaining a mechanical fall with reported loss of consciousness for at least 10 minutes with imaging revealing bilateral acute subdural hematoma -Neurosurgery/Cabbell recommended medical admission for observation -Neurologically intact and will continue neurological checks every 4 hours -Repeat CT shows increase in size    Frequent falls -Patient reports several "minor" falls with 2 "major" falls in the past month -No preceding dizziness, weakness or palpitations so does not appear consistent with syncopal etiology -Patient notes has tripped over objects in the floor or tripped against furniture; admits to poor visual acuity and only utilizes reading glasses prn and has not seen an ophthalmologist in many years-poor vision may be contributing to her  falls -Utilizes rolling walker and consistently -PT/OT evaluation    Dehydration with hyponatremia -Likely related to concomitant use of thiazide diuretic -Hold HCTZ portion of antihypertensive -daily labs    CAD S/P percutaneous coronary angioplasty/DES (RCA-2014/LAD-2016) -Currently asymptomatic -After 2016 catheterization cardiologist recommended considering long-term DAPT or monotherapy with Brilinta since she has diabetes and multivessel coronary disease -cardiology consult: She is currently > 1 year post PCI, it is ok to discontinue Plavix, however Aspirin will have to be restarted once her SDH is stabilized with a follow up scans. Hold for now. It might not be re-started until after discharge.     Type 2 diabetes, uncontrolled, with neuropathy  -Patient admits to poorly controlled diabetes "for years" -Of note, hemoglobin A1c was 11 in September prior to last stent placement and has decreased to 8 based on reading from August 2017 -HgbA1c 9.1 now    Essential hypertension -Modestly uncontrolled since admission but patient also reporting issues with recurrent headache and neck pain post SDH -resume home meds    Hyperlipidemia -Lipitor    Osteoarthritis of back -Continue preadmission oxycodone with Tylenol   DVT prophylaxis:  SCD's  Code Status: DNR   Family Communication: patient  Disposition Plan:     Consultants:   NS    Subjective: C/o headache-- no worse then yesterday  Objective: Vitals:   07/20/16 0436 07/20/16 0813 07/20/16 0833 07/20/16 1000  BP: (!) 153/70 138/64 138/64 (!) 102/48  Pulse: 65 64 64 (!) 56  Resp: 20   20  Temp: 97.6 F (36.4 C)   (!) 95.5 F (35.3 C)  TempSrc: Oral   Oral  SpO2: 99%   96%  Intake/Output Summary (Last 24 hours) at 07/20/16 1100 Last data filed at 07/20/16 0432  Gross per 24 hour  Intake             1000 ml  Output                0 ml  Net             1000 ml   There were no vitals filed for  this visit.  Examination:  General exam: Appears calm and comfortable  Respiratory system: Clear to auscultation. Respiratory effort normal. Cardiovascular system: S1 & S2 heard, RRR. No JVD, murmurs, rubs, gallops or clicks. No pedal edema. Gastrointestinal system: Abdomen is nondistended, soft and nontender. No organomegaly or masses felt. Normal bowel sounds heard. Central nervous system: Alert and oriented. No focal neurological deficits.     Data Reviewed: I have personally reviewed following labs and imaging studies  CBC:  Recent Labs Lab 07/19/16 0443 07/19/16 0510 07/20/16 0608  WBC 12.4*  --  11.7*  NEUTROABS 8.9*  --   --   HGB 13.7 13.9 12.2  HCT 39.9 41.0 36.7  MCV 88.5  --  90.0  PLT 244  --  [id number]   Basic Metabolic Panel:  Recent Labs Lab 07/19/16 0443 07/19/16 0510 07/19/16 0930 07/19/16 1411 07/20/16 0608  NA 131* 133*  --  133* 134*  K 5.0 5.2*  --  4.2 4.3  CL 94* 99*  --  100* 104  CO2 23  --   --  24 23  GLUCOSE 277* 290*  --  285* 243*  BUN 20 27*  --  15 17  CREATININE 0.74 0.80  --  0.84 1.12*  CALCIUM 9.5  --   --  9.2 8.7*  MG  --   --  2.1  --   --    GFR: CrCl cannot be calculated (Unknown ideal weight.). Liver Function Tests:  Recent Labs Lab 07/20/16 0608  AST 15  ALT 12*  ALKPHOS 93  BILITOT 0.7  PROT 6.6  ALBUMIN 3.6   No results for input(s): LIPASE, AMYLASE in the last 168 hours. No results for input(s): AMMONIA in the last 168 hours. Coagulation Profile:  Recent Labs Lab 07/19/16 0443  INR 0.93   Cardiac Enzymes: No results for input(s): CKTOTAL, CKMB, CKMBINDEX, TROPONINI in the last 168 hours. BNP (last 3 results) No results for input(s): PROBNP in the last 8760 hours. HbA1C:  Recent Labs  07/19/16 0718  HGBA1C 9.1*   CBG:  Recent Labs Lab 07/19/16 0831 07/19/16 1206 07/19/16 1651 07/19/16 2225 07/20/16 0642  GLUCAP 218* 258* 217* 222* 223*   Lipid Profile: No results for input(s): CHOL,  HDL, LDLCALC, TRIG, CHOLHDL, LDLDIRECT in the last 72 hours. Thyroid Function Tests: No results for input(s): TSH, T4TOTAL, FREET4, T3FREE, THYROIDAB in the last 72 hours. Anemia Panel: No results for input(s): VITAMINB12, FOLATE, FERRITIN, TIBC, IRON, RETICCTPCT in the last 72 hours. Urine analysis:    Component Value Date/Time   COLORURINE YELLOW 10/19/2012 1030   APPEARANCEUR CLOUDY (A) 10/19/2012 1030   LABSPEC 1.044 (H) 10/19/2012 1030   PHURINE 5.5 10/19/2012 1030   GLUCOSEU >1000 (A) 10/19/2012 1030   HGBUR NEGATIVE 10/19/2012 1030   BILIRUBINUR NEGATIVE 10/19/2012 1030   KETONESUR NEGATIVE 10/19/2012 1030   PROTEINUR NEGATIVE 10/19/2012 1030   UROBILINOGEN 1.0 10/19/2012 1030   NITRITE NEGATIVE 10/19/2012 1030   LEUKOCYTESUR SMALL (A) 10/19/2012 1030     )No results  found for this or any previous visit (from the past 240 hour(s)).    Anti-infectives    None       Radiology Studies: Dg Shoulder Right  Result Date: 07/19/2016 CLINICAL DATA:  Trip and fall with bilateral shoulder pain. EXAM: RIGHT SHOULDER - 2+ VIEW COMPARISON:  None. FINDINGS: There is no evidence of fracture or dislocation. Degenerative change at the acromioclavicular joint. Minimal glenohumeral osteoarthritis. Soft tissues are unremarkable. IMPRESSION: No acute fracture or subluxation of the right shoulder. Electronically Signed   By: Jeb Levering M.D.   On: 07/19/2016 02:28   Ct Head Wo Contrast  Result Date: 07/20/2016 CLINICAL DATA:  76 year old female with history of subdural hematoma. Headache since waking up this morning. EXAM: CT HEAD WITHOUT CONTRAST TECHNIQUE: Contiguous axial images were obtained from the base of the skull through the vertex without intravenous contrast. COMPARISON:  07/19/2016. FINDINGS: Brain: Again noted is high attenuation material all noted lying adjacent to the falx cerebri, predominantly on the left side. This has increased compared to the prior study, measuring up to  a maximal thickness of 9 mm (coronal image 38 of series 203). This continues to layer slightly along the left side of the tentorium cerebelli. No significant mass effect. No intraparenchymal hemorrhage. No hydrocephalus. Patchy and confluent areas of decreased attenuation are noted throughout the deep and periventricular white matter of the cerebral hemispheres bilaterally, compatible with chronic microvascular ischemic disease. Vascular: No hyperdense vessel or unexpected calcification. Skull: Normal. Negative for fracture or focal lesion. Sinuses/Orbits: No acute finding. Other: None. IMPRESSION: 1. Slight increase in size of left-sided subdural hematoma lying predominantly along the falx cerebri with some layering along the left tentorium cerebelli. This currently measures up to 9 mm in maximal thickness. Electronically Signed   By: Vinnie Langton M.D.   On: 07/20/2016 09:00   Ct Head Wo Contrast  Result Date: 07/19/2016 CLINICAL DATA:  Tripped and fell, hitting head on coffee table. Loss of consciousness. Headache. Forehead scalp hematoma. Concern for cervical spine injury. Initial encounter. EXAM: CT HEAD WITHOUT CONTRAST CT CERVICAL SPINE WITHOUT CONTRAST TECHNIQUE: Multidetector CT imaging of the head and cervical spine was performed following the standard protocol without intravenous contrast. Multiplanar CT image reconstructions of the cervical spine were also generated. COMPARISON:  CT of the head performed 06/26/2016, and MRI/MRA of the brain performed 05/20/2009 FINDINGS: CT HEAD FINDINGS Brain: Acute subdural bleed is noted tracking along both sides of the falx cerebri, superiorly and posteriorly. No mass lesion is seen. There is no evidence of acute infarction. There is no evidence of hydrocephalus. Mild cerebellar atrophy is noted. The posterior fossa, including the cerebellum, brainstem and fourth ventricle, is within normal limits. The third and lateral ventricles, and basal ganglia are  unremarkable in appearance. The cerebral hemispheres are symmetric in appearance, with normal gray-white differentiation. No mass effect or midline shift is seen. Vascular: No hyperdense vessel or unexpected calcification. Skull: There is no evidence of fracture; visualized osseous structures are unremarkable in appearance. Sinuses/Orbits: The orbits are within normal limits. The paranasal sinuses and mastoid air cells are well-aerated. Other: Mild soft tissue swelling is noted overlying the high frontal calvarium. CT CERVICAL SPINE FINDINGS Alignment: Normal. Skull base and vertebrae: No acute fracture. No primary bone lesion or focal pathologic process. Soft tissues and spinal canal: No prevertebral fluid or swelling. No visible canal hematoma. Disc levels: Scattered anterior and posterior disc osteophyte complexes are noted along the cervical spine. Mild degenerative change is noted about  the dens. Intervertebral disc spaces are grossly preserved. Upper chest: The thyroid gland is grossly unremarkable in appearance. The visualized lung apices are clear. Calcification is seen at the carotid bifurcations bilaterally, with likely mild to moderate left-sided luminal narrowing. Other: No additional soft tissue abnormalities are seen. IMPRESSION: 1. Acute subdural bleed tracking along both sides of the falx cerebri, superiorly and posteriorly. 2. No evidence of fracture or subluxation along the cervical spine. 3. Mild soft tissue swelling overlying the high frontal calvarium. 4. Mild degenerative change along the cervical spine. 5. Calcification at the carotid bifurcations bilaterally, with likely mild to moderate left-sided luminal narrowing. Carotid ultrasound is recommended for further evaluation, when and as deemed clinically appropriate. Critical Value/emergent results were called by telephone at the time of interpretation on 07/19/2016 at 2:51 am to Dr. [name], who verbally acknowledged these results.  Electronically Signed   By: Garald Balding M.D.   On: 07/19/2016 02:52   Ct Cervical Spine Wo Contrast  Result Date: 07/19/2016 CLINICAL DATA:  Tripped and fell, hitting head on coffee table. Loss of consciousness. Headache. Forehead scalp hematoma. Concern for cervical spine injury. Initial encounter. EXAM: CT HEAD WITHOUT CONTRAST CT CERVICAL SPINE WITHOUT CONTRAST TECHNIQUE: Multidetector CT imaging of the head and cervical spine was performed following the standard protocol without intravenous contrast. Multiplanar CT image reconstructions of the cervical spine were also generated. COMPARISON:  CT of the head performed 06/26/2016, and MRI/MRA of the brain performed 05/20/2009 FINDINGS: CT HEAD FINDINGS Brain: Acute subdural bleed is noted tracking along both sides of the falx cerebri, superiorly and posteriorly. No mass lesion is seen. There is no evidence of acute infarction. There is no evidence of hydrocephalus. Mild cerebellar atrophy is noted. The posterior fossa, including the cerebellum, brainstem and fourth ventricle, is within normal limits. The third and lateral ventricles, and basal ganglia are unremarkable in appearance. The cerebral hemispheres are symmetric in appearance, with normal gray-white differentiation. No mass effect or midline shift is seen. Vascular: No hyperdense vessel or unexpected calcification. Skull: There is no evidence of fracture; visualized osseous structures are unremarkable in appearance. Sinuses/Orbits: The orbits are within normal limits. The paranasal sinuses and mastoid air cells are well-aerated. Other: Mild soft tissue swelling is noted overlying the high frontal calvarium. CT CERVICAL SPINE FINDINGS Alignment: Normal. Skull base and vertebrae: No acute fracture. No primary bone lesion or focal pathologic process. Soft tissues and spinal canal: No prevertebral fluid or swelling. No visible canal hematoma. Disc levels: Scattered anterior and posterior disc osteophyte  complexes are noted along the cervical spine. Mild degenerative change is noted about the dens. Intervertebral disc spaces are grossly preserved. Upper chest: The thyroid gland is grossly unremarkable in appearance. The visualized lung apices are clear. Calcification is seen at the carotid bifurcations bilaterally, with likely mild to moderate left-sided luminal narrowing. Other: No additional soft tissue abnormalities are seen. IMPRESSION: 1. Acute subdural bleed tracking along both sides of the falx cerebri, superiorly and posteriorly. 2. No evidence of fracture or subluxation along the cervical spine. 3. Mild soft tissue swelling overlying the high frontal calvarium. 4. Mild degenerative change along the cervical spine. 5. Calcification at the carotid bifurcations bilaterally, with likely mild to moderate left-sided luminal narrowing. Carotid ultrasound is recommended for further evaluation, when and as deemed clinically appropriate. Critical Value/emergent results were called by telephone at the time of interpretation on 07/19/2016 at 2:51 am to Dr. [name], who verbally acknowledged these results. Electronically Signed   By:  Garald Balding M.D.   On: 07/19/2016 02:52   Dg Shoulder Left  Result Date: 07/19/2016 CLINICAL DATA:  Trip and fall with bilateral shoulder pain. EXAM: LEFT SHOULDER - 2+ VIEW COMPARISON:  None. FINDINGS: There is no evidence of fracture or dislocation. Osteoarthritis of the acromioclavicular and glenohumeral joints. Questionable superior subluxation of the humeral head versus artifact, suggesting rotator cuff arthropathy. Soft tissues are unremarkable. IMPRESSION: No acute fracture or subluxation of the left shoulder. Electronically Signed   By: Jeb Levering M.D.   On: 07/19/2016 02:30        Scheduled Meds: . amitriptyline  150 mg Oral QHS  . amLODipine  2.5 mg Oral Daily  . atorvastatin  40 mg Oral q1800  . carvedilol  25 mg Oral BID WC  . insulin aspart  0-5 Units  Subcutaneous QHS  . insulin aspart  0-9 Units Subcutaneous TID WC  . insulin glargine  17 Units Subcutaneous Daily  . losartan  100 mg Oral Daily  . sodium chloride flush  3 mL Intravenous Q12H   Continuous Infusions: . sodium chloride 75 mL/hr at 07/19/16 2151     LOS: 0 days    Time spent: 25 min    Leroy, DO [facility] Pager [phone]  If 7PM-7AM, please contact night-coverage [email or web address] Password [email or web address] 07/20/2016, 11:00 AM

## 2016-07-20 NOTE — Progress Notes (Signed)
  Inpatient Diabetes Program Recommendations  AACE/ADA: New Consensus Statement on Inpatient Glycemic Control (2015)  Target Ranges:  Prepandial:   less than 140 mg/dL      Peak postprandial:   less than 180 mg/dL (1-2 hours)      Critically ill patients:  140 - 180 mg/dL     Diabetes history: DM 2  Consult for Poorly Controlled DM-Reeducation  Spoke with patient about her home DM control. Patient reported she has seen an increase in her glucose trend at home. Patient had decreased her A1c from 11% down to 8% last admission. Spoke to patient about rise in her A1c (9.1% this admission) Patient reports that she is not doing anything different and in fact she has had a decrease in appetite and has lost weight. Spoke with patient about her diet at home. Patient reports following a DM diet and drinking diet Dr. Malachi Bonds. Patient reports she is still active and works. Spoke with patient about following up with her PCP to possibly get adjustments to her medication regimen.  Thanks,  Tama Headings RN, MSN, [facility] Inpatient Diabetes Coordinator Team Pager [phone] (8a-5p)

## 2016-07-21 ENCOUNTER — Encounter (HOSPITAL_COMMUNITY): Payer: Self-pay | Admitting: General Practice

## 2016-07-21 DIAGNOSIS — E785 Hyperlipidemia, unspecified: Secondary | ICD-10-CM

## 2016-07-21 LAB — GLUCOSE, CAPILLARY
Glucose-Capillary: 177 mg/dL — ABNORMAL HIGH (ref 65–99)
Glucose-Capillary: 193 mg/dL — ABNORMAL HIGH (ref 65–99)

## 2016-07-21 MED ORDER — GLIPIZIDE 5 MG PO TABS: 5.0000 mg | ORAL_TABLET | Freq: Two times a day (BID) | ORAL | Status: DC

## 2016-07-21 MED ORDER — LEVETIRACETAM 500 MG PO TABS
500.0000 mg | ORAL_TABLET | Freq: Two times a day (BID) | ORAL | Status: DC
  Administered 2016-07-21: 500 mg via ORAL
  Filled 2016-07-21: qty 1

## 2016-07-21 MED ORDER — GLIPIZIDE 5 MG PO TABS: 5.0000 mg | ORAL_TABLET | Freq: Two times a day (BID) | ORAL | 0 refills | Status: DC

## 2016-07-21 MED ORDER — LEVETIRACETAM 500 MG PO TABS: 500.0000 mg | ORAL_TABLET | Freq: Two times a day (BID) | ORAL | 0 refills | Status: DC

## 2016-07-21 NOTE — Consult Note (Signed)
[facility] CM Primary Care Navigator  07/21/2016  Cynthia Singh 06/12/1940 [id number]   Met with patient and daughter Cynthia Singh) at the bedside to identify possible discharge needs. Patient reports tripping over a coffee table when trying to open the door for daughter and fell, that had led to this admission.  Patient endorses Dr. [name] with [place] [facility] as the primary care provider.    Patient shared using [facility] in [place] to obtain medications without any problem.  She reports managing her own medications at home straight out of the containers.   Patient states being able to drive prior to admission. Daughters Sales promotion account executive and [place]) or son Otila Kluver) can provide transportation to her doctors' appointments after discharge.  Her daughter will be the primary caregiver at home as stated.   Discharge plan is home with home health services per patient.  Patient expressed understanding to call primary care provider's office when she gets back home, for a post discharge follow-up appointment within a week or sooner if needed.Patient letter (with PCP's contact number) was provided as a reminder.  Explained to patient regarding [facility] CM services available for disease management (DM) but she stated awareness on how to manage DM. Patient voiced understanding to keep a log of blood sugar readings and bring it to doctor's appointment for evaluation. Patient made aware to have his primary care provider refer him to [facility] care management for needed services. [facility] contact information was provided for future needs that may arise.   For additional questions please contact:  Edwena Felty A. Meekah Math, BSN, RN-BC [facility] PRIMARY CARE Navigator Cell: [phone]

## 2016-07-21 NOTE — Progress Notes (Signed)
Occupational Therapy Treatment Patient Details Name: Cynthia Singh MRN: [id number] DOB: 1940-10-10 Today's Date: 07/21/2016    History of present illness Pt is a 76 y/o female admitted secondary to a fall resulting in L falx cerebri subdural hematoma. PMH includes falls, DM, HTN, CAD s/p RCA stenting, and osteoarthritis (LLE>RLE).    OT comments  Pt progressing toward OT goals. She continues to report headache but feels that this is improving. She continues to require VC's for safe use of RW as she frequently tries to set this aside during ambulation. Educated on fall prevention and safety post-acute D/C. She required min guard assist overall with ADL this session. Will continue to follow acutely to improve independence and safety with ADL in anticipation of D/C home with [facility] services.  Follow Up Recommendations  Home health OT;Supervision/Assistance - 24 hour    Equipment Recommendations  3 in 1 bedside commode    Recommendations for Other Services      Precautions / Restrictions Precautions Precautions: Fall Precaution Comments: History of 2 recent falls.  Restrictions Weight Bearing Restrictions: No       Mobility Bed Mobility Overal bed mobility: Needs Assistance Bed Mobility: Supine to Sit     Supine to sit: Min guard;HOB elevated Sit to supine: Supervision   General bed mobility comments: Pt reports she sleeps with 3 pillows, therefore, HOB slightly elevated. Min guard for safety.   Transfers Overall transfer level: Needs assistance Equipment used: Rolling walker (2 wheeled) Transfers: Sit to/from Stand Sit to Stand: Min guard         General transfer comment: Min guard for safety    Balance Overall balance assessment: Needs assistance Sitting-balance support: Feet supported;No upper extremity supported Sitting balance-Leahy Scale: Fair Sitting balance - Comments: Sitting balance improved from previous session. No longer demonstrating L lateral lean.     Standing balance support: Bilateral upper extremity supported;No upper extremity supported Standing balance-Leahy Scale: Fair Standing balance comment: Able to stand with periods of no UE support when standing at sink to wash hands.                    ADL Overall ADL's : Needs assistance/impaired Eating/Feeding: Set up;Sitting   Grooming: Min guard;Standing   Upper Body Bathing: Min guard;Standing       Upper Body Dressing : Set up;Sitting   Lower Body Dressing: Min guard;Sit to/from stand   Toilet Transfer: Min guard;Ambulation;RW   Toileting- Water quality scientist and Hygiene: Min guard;Sit to/from stand       Functional mobility during ADLs: Min guard;Rolling walker General ADL Comments: Improved stability during ADL. Continues to require VC's for safety with RW. Frequently leaves walker behind during mobility.      Vision                 Additional Comments: Reports blurred vision remains but is improving.   Perception     Praxis      Cognition   Behavior During Therapy: WFL for tasks assessed/performed Overall Cognitive Status: No family/caregiver present to determine baseline cognitive functioning                  General Comments: Demonstrated slow processing and difficulty with problem solving. Required increased time to follow commands. May be due to headache.       Exercises     Shoulder Instructions       General Comments      Pertinent Vitals/ Pain  Pain Assessment: Faces Pain Score: 8  Faces Pain Scale: Hurts whole lot Pain Location: Headache (behind eyes), neck Pain Descriptors / Indicators: Headache;Grimacing;Sore Pain Intervention(s): Limited activity within patient's tolerance;Monitored during session;Repositioned  Home Living                                          Prior Functioning/Environment              Frequency  Min 2X/week        Progress Toward Goals  OT  Goals(current goals can now be found in the care plan section)     Acute Rehab OT Goals Patient Stated Goal: to have less headache OT Goal Formulation: With patient Time For Goal Achievement: 08/03/16 Potential to Achieve Goals: Good ADL Goals Pt Will Perform Grooming: with modified independence;standing Pt Will Transfer to Toilet: with modified independence;ambulating;bedside commode (BSC over toilet) Pt Will Perform Toileting - Clothing Manipulation and hygiene: with modified independence;sit to/from stand Pt Will Perform Tub/Shower Transfer: with modified independence;3 in 1;rolling walker;ambulating;Shower transfer Additional ADL Goal #1: Pt will complete multi-step ADL routine with no VC's for problem solving.  Plan Discharge plan remains appropriate    Co-evaluation                 End of Session Equipment Utilized During Treatment: Gait belt;Rolling walker  OT Visit Diagnosis: Unsteadiness on feet (R26.81);Low vision, both eyes (H54.2)   Activity Tolerance Patient tolerated treatment well   Patient Left in chair;with call bell/phone within reach;with chair alarm set   Nurse Communication Mobility status        Time: [id number] OT Time Calculation (min): 37 min  Charges: OT General Charges $OT Visit: 1 Procedure OT Treatments $Self Care/Home Management : 23-37 mins  Cynthia Herrlich, MS OTR/L  Pager: [place] [facility] Cynthia Singh 07/21/2016, 3:06 PM

## 2016-07-21 NOTE — Progress Notes (Signed)
Physical Therapy Treatment Patient Details Name: Cynthia Singh MRN: [id number] DOB: Jun 15, 1940 Today's Date: 07/21/2016    History of Present Illness Pt is a 76 y/o female admitted secondary to a fall resulting in L falx cerebri subdural hematoma. PMH includes falls, DM, HTN, CAD s/p RCA stenting, and osteoarthritis (LLE>RLE).     PT Comments    Pt slowly progressing towards goals. Pt continues to be limited in ambulation distance secondary to headache. Pt reports it is worse this session. Pt increased ambulation distance requiring min guard to supervision for safety. Updated DME needs to RW. Pt states she has rollator at home, not a RW. Continue to recommend follow up recommendations below. Will continue to follow.    Follow Up Recommendations  Home health PT;Supervision/Assistance - 24 hour     Equipment Recommendations  Rolling walker with 5" wheels    Recommendations for Other Services       Precautions / Restrictions Precautions Precautions: Fall Precaution Comments: History of 2 recent falls.  Restrictions Weight Bearing Restrictions: No    Mobility  Bed Mobility Overal bed mobility: Needs Assistance Bed Mobility: Supine to Sit     Supine to sit: Min guard;HOB elevated Sit to supine: Supervision   General bed mobility comments: Pt reports she sleeps with 3 pillows, therefore, HOB slightly elevated. Min guard for safety.   Transfers Overall transfer level: Needs assistance Equipment used: Rolling walker (2 wheeled) Transfers: Sit to/from Stand Sit to Stand: Min guard         General transfer comment: Min guard for safety  Ambulation/Gait Ambulation/Gait assistance: Min guard;Supervision Ambulation Distance (Feet): 75 Feet Assistive device: Rolling walker (2 wheeled) Gait Pattern/deviations: Step-through pattern;Decreased stride length;Narrow base of support;Drifts right/left Gait velocity: Decreased Gait velocity interpretation: Below normal speed  for age/gender General Gait Details: Pt demonstrating slow gait. Verbal cues for looking ahead and upright posture. Continues to demonstrate increased sensitivity to light and headache which limited ambulation distance. Min guard for decreased stability, however, able to decrease assist to supervision at end of gait training.    Stairs            Wheelchair Mobility    Modified Rankin (Stroke Patients Only)       Balance Overall balance assessment: Needs assistance Sitting-balance support: Feet supported;No upper extremity supported Sitting balance-Leahy Scale: Fair Sitting balance - Comments: Sitting balance improved from previous session. No longer demonstrating L lateral lean.     Standing balance support: Bilateral upper extremity supported;No upper extremity supported Standing balance-Leahy Scale: Fair Standing balance comment: Able to stand with periods of no UE support when standing at sink to wash hands.                     Cognition Arousal/Alertness: Awake/alert Behavior During Therapy: WFL for tasks assessed/performed Overall Cognitive Status: No family/caregiver present to determine baseline cognitive functioning                 General Comments: Demonstrated slow processing and difficulty with problem solving. Required increased time to follow commands. May be due to headache.     Exercises      General Comments General comments (skin integrity, edema, etc.): Min guard for toileting activities. Educated pt about use of RW at home. Pt states she has rollator, so equipment needs will be updated. Educated about importance of having someone with her during functional mobility tasks.       Pertinent Vitals/Pain Pain Assessment: Faces Pain Score: 8  Faces Pain Scale: Hurts whole lot Pain Location: Headache (behind eyes), neck Pain Descriptors / Indicators: Headache;Grimacing;Sore Pain Intervention(s): Limited activity within patient's  tolerance;Monitored during session;Repositioned    Home Living                      Prior Function            PT Goals (current goals can now be found in the care plan section) Acute Rehab PT Goals Patient Stated Goal: to have less headache PT Goal Formulation: With patient Time For Goal Achievement: 08/03/16 Potential to Achieve Goals: Good Progress towards PT goals: Progressing toward goals    Frequency    Min 3X/week      PT Plan Current plan remains appropriate    Co-evaluation             End of Session Equipment Utilized During Treatment: Gait belt Activity Tolerance: Patient limited by pain;Patient limited by fatigue Patient left: in bed;with call bell/phone within reach;with bed alarm set Nurse Communication: Mobility status PT Visit Diagnosis: Unsteadiness on feet (R26.81);Pain Pain - part of body:  (head)     Time: [id number] PT Time Calculation (min) (ACUTE ONLY): 12 min  Charges:  $Gait Training: 8-22 mins                    G Codes:       Mamie Levers 07/21/2016, 12:46 PM  Nicky Pugh, PT, DPT  Acute Rehabilitation Services  Pager: [phone]

## 2016-07-21 NOTE — Discharge Instructions (Signed)
Blood Glucose Monitoring, Adult Monitoring your blood sugar (glucose) helps you manage your diabetes. It also helps you and your health care provider determine how well your diabetes management plan is working. Blood glucose monitoring involves checking your blood glucose as often as directed, and keeping a record (log) of your results over time. Why should I monitor my blood glucose? Checking your blood glucose regularly can:  Help you understand how food, exercise, illnesses, and medicines affect your blood glucose.  Let you know what your blood glucose is at any time. You can quickly tell if you are having low blood glucose (hypoglycemia) or high blood glucose (hyperglycemia).  Help you and your health care provider adjust your medicines as needed. When should I check my blood glucose? Follow instructions from your health care provider about how often to check your blood glucose. This may depend on:  The type of diabetes you have.  How well-controlled your diabetes is.  Medicines you are taking. If you have type 1 diabetes:   Check your blood glucose at least 2 times a day.  Also check your blood glucose:  Before every insulin injection.  Before and after exercise.  Between meals.  2 hours after a meal.  Occasionally between 2:00 a.m. and 3:00 a.m., as directed.  Before potentially dangerous tasks, like driving or using heavy machinery.  At bedtime.  You may need to check your blood glucose more often, up to 6-10 times a day:  If you use an insulin pump.  If you need multiple daily injections (MDI).  If your diabetes is not well-controlled.  If you are ill.  If you have a history of severe hypoglycemia.  If you have a history of not knowing when your blood glucose is getting low (hypoglycemia unawareness). If you have type 2 diabetes:   If you take insulin or other diabetes medicines, check your blood glucose at least 2 times a day.  If you are on intensive  insulin therapy, check your blood glucose at least 4 times a day. Occasionally, you may also need to check between 2:00 a.m. and 3:00 a.m., as directed.  Also check your blood glucose:  Before and after exercise.  Before potentially dangerous tasks, like driving or using heavy machinery.  You may need to check your blood glucose more often if:  Your medicine is being adjusted.  Your diabetes is not well-controlled.  You are ill. What is a blood glucose log?  A blood glucose log is a record of your blood glucose readings. It helps you and your health care provider:  Look for patterns in your blood glucose over time.  Adjust your diabetes management plan as needed.  Every time you check your blood glucose, write down your result and notes about things that may be affecting your blood glucose, such as your diet and exercise for the day.  Most glucose meters store a record of glucose readings in the meter. Some meters allow you to download your records to a computer. How do I check my blood glucose? Follow these steps to get accurate readings of your blood glucose: Supplies needed    Blood glucose meter.  Test strips for your meter. Each meter has its own strips. You must use the strips that come with your meter.  A needle to prick your finger (lancet). Do not use lancets more than once.  A device that holds the lancet (lancing device).  A journal or log book to write down your results. Procedure  Wash your hands with soap and water.  Prick the side of your finger (not the tip) with the lancet. Use a different finger each time.  Gently rub the finger until a small drop of blood appears.  Follow instructions that come with your meter for inserting the test strip, applying blood to the strip, and using your blood glucose meter.  Write down your result and any notes. Alternative testing sites   Some meters allow you to use areas of your body other than your finger  (alternative sites) to test your blood.  If you think you may have hypoglycemia, or if you have hypoglycemia unawareness, do not use alternative sites. Use your finger instead.  Alternative sites may not be as accurate as the fingers, because blood flow is slower in these areas. This means that the result you get may be delayed, and it may be different from the result that you would get from your finger.  The most common alternative sites are:  Forearm.  Thigh.  Palm of the hand. Additional tips   Always keep your supplies with you.  If you have questions or need help, all blood glucose meters have a 24-hour hotline number that you can call. You may also contact your health care provider.  After you use a few boxes of test strips, adjust (calibrate) your blood glucose meter by following instructions that came with your meter. This information is not intended to replace advice given to you by your health care provider. Make sure you discuss any questions you have with your health care provider. Document Released: 05/07/2003 Document Revised: 11/22/2015 Document Reviewed: 10/14/2015 Elsevier Interactive Patient Education  2017 [place].   Diabetes and Foot Care Diabetes may cause you to have problems because of poor blood supply (circulation) to your feet and legs. This may cause the skin on your feet to become thinner, break easier, and heal more slowly. Your skin may become dry, and the skin may peel and crack. You may also have nerve damage in your legs and feet causing decreased feeling in them. You may not notice minor injuries to your feet that could lead to infections or more serious problems. Taking care of your feet is one of the most important things you can do for yourself. Follow these instructions at home:  Wear shoes at all times, even in the house. Do not go barefoot. Bare feet are easily injured.  Check your feet daily for blisters, cuts, and redness. If you cannot  see the bottom of your feet, use a mirror or ask someone for help.  Wash your feet with warm water (do not use hot water) and mild soap. Then pat your feet and the areas between your toes until they are completely dry. Do not soak your feet as this can dry your skin.  Apply a moisturizing lotion or petroleum jelly (that does not contain alcohol and is unscented) to the skin on your feet and to dry, brittle toenails. Do not apply lotion between your toes.  Trim your toenails straight across. Do not dig under them or around the cuticle. File the edges of your nails with an emery board or nail file.  Do not cut corns or calluses or try to remove them with medicine.  Wear clean socks or stockings every day. Make sure they are not too tight. Do not wear knee-high stockings since they may decrease blood flow to your legs.  Wear shoes that fit properly and have enough cushioning.  To break in new shoes, wear them for just a few hours a day. This prevents you from injuring your feet. Always look in your shoes before you put them on to be sure there are no objects inside.  Do not cross your legs. This may decrease the blood flow to your feet.  If you find a minor scrape, cut, or break in the skin on your feet, keep it and the skin around it clean and dry. These areas may be cleansed with mild soap and water. Do not cleanse the area with peroxide, alcohol, or iodine.  When you remove an adhesive bandage, be sure not to damage the skin around it.  If you have a wound, look at it several times a day to make sure it is healing.  Do not use heating pads or hot water bottles. They may burn your skin. If you have lost feeling in your feet or legs, you may not know it is happening until it is too late.  Make sure your health care provider performs a complete foot exam at least annually or more often if you have foot problems. Report any cuts, sores, or bruises to your health care provider immediately. Contact  a health care provider if:  You have an injury that is not healing.  You have cuts or breaks in the skin.  You have an ingrown nail.  You notice redness on your legs or feet.  You feel burning or tingling in your legs or feet.  You have pain or cramps in your legs and feet.  Your legs or feet are numb.  Your feet always feel cold. Get help right away if:  There is increasing redness, swelling, or pain in or around a wound.  There is a red line that goes up your leg.  Pus is coming from a wound.  You develop a fever or as directed by your health care provider.  You notice a bad smell coming from an ulcer or wound. This information is not intended to replace advice given to you by your health care provider. Make sure you discuss any questions you have with your health care provider. Document Released: 05/01/2000 Document Revised: 10/10/2015 Document Reviewed: 10/11/2012 Elsevier Interactive Patient Education  2017 [place] Prevention in the Home Falls can cause injuries. They can happen to people of all ages. There are many things you can do to make your home safe and to help prevent falls. What can I do on the outside of my home? Regularly fix the edges of walkways and driveways and fix any cracks. Remove anything that might make you trip as you walk through a door, such as a raised step or threshold. Trim any bushes or trees on the path to your home. Use bright outdoor lighting. Clear any walking paths of anything that might make someone trip, such as rocks or tools. Regularly check to see if handrails are loose or broken. Make sure that both sides of any steps have handrails. Any raised decks and porches should have guardrails on the edges. Have any leaves, snow, or ice cleared regularly. Use sand or salt on walking paths during winter. Clean up any spills in your garage right away. This includes oil or grease spills. What can I do in the bathroom? Use night  lights. Install grab bars by the toilet and in the tub and shower. Do not use towel bars as grab bars. Use non-skid mats or decals in the tub or  shower. If you need to sit down in the shower, use a plastic, non-slip stool. Keep the floor dry. Clean up any water that spills on the floor as soon as it happens. Remove soap buildup in the tub or shower regularly. Attach bath mats securely with double-sided non-slip rug tape. Do not have throw rugs and other things on the floor that can make you trip. What can I do in the bedroom? Use night lights. Make sure that you have a light by your bed that is easy to reach. Do not use any sheets or blankets that are too big for your bed. They should not hang down onto the floor. Have a firm chair that has side arms. You can use this for support while you get dressed. Do not have throw rugs and other things on the floor that can make you trip. What can I do in the kitchen? Clean up any spills right away. Avoid walking on wet floors. Keep items that you use a lot in easy-to-reach places. If you need to reach something above you, use a strong step stool that has a grab bar. Keep electrical cords out of the way. Do not use floor polish or wax that makes floors slippery. If you must use wax, use non-skid floor wax. Do not have throw rugs and other things on the floor that can make you trip. What can I do with my stairs? Do not leave any items on the stairs. Make sure that there are handrails on both sides of the stairs and use them. Fix handrails that are broken or loose. Make sure that handrails are as long as the stairways. Check any carpeting to make sure that it is firmly attached to the stairs. Fix any carpet that is loose or worn. Avoid having throw rugs at the top or bottom of the stairs. If you do have throw rugs, attach them to the floor with carpet tape. Make sure that you have a light switch at the top of the stairs and the bottom of the stairs. If  you do not have them, ask someone to add them for you. What else can I do to help prevent falls? Wear shoes that: Do not have high heels. Have rubber bottoms. Are comfortable and fit you well. Are closed at the toe. Do not wear sandals. If you use a stepladder: Make sure that it is fully opened. Do not climb a closed stepladder. Make sure that both sides of the stepladder are locked into place. Ask someone to hold it for you, if possible. Clearly mark and make sure that you can see: Any grab bars or handrails. First and last steps. Where the edge of each step is. Use tools that help you move around (mobility aids) if they are needed. These include: Canes. Walkers. Scooters. Crutches. Turn on the lights when you go into a dark area. Replace any light bulbs as soon as they burn out. Set up your furniture so you have a clear path. Avoid moving your furniture around. If any of your floors are uneven, fix them. If there are any pets around you, be aware of where they are. Review your medicines with your doctor. Some medicines can make you feel dizzy. This can increase your chance of falling. Ask your doctor what other things that you can do to help prevent falls. This information is not intended to replace advice given to you by your health care provider. Make sure you discuss any questions you have  with your health care provider. Document Released: 02/28/2009 Document Revised: 10/10/2015 Document Reviewed: 06/08/2014 Elsevier Interactive Patient Education  2017 [employer].

## 2016-07-21 NOTE — Discharge Summary (Signed)
Physician Discharge Summary  Cynthia Singh [id number] DOB: 1941/05/14 DOA: 07/19/2016  PCP: [name], MD  Admit date: 07/19/2016 Discharge date: 07/21/2016   Recommendations for Outpatient Follow-Up:   1. Close follow up of DM meds-- apparently patient quit taking NPH/regular insulin sometime after July 2. Spoke with family to become more involved in her medication management 3. Holding ASA/plavix until seen by NS in 2 weeks 4. Home health 5. keppra x 7 days   Discharge Diagnosis:   Principal Problem:   Subdural bleeding (HCC) Active Problems:   Type 2 diabetes, uncontrolled, with neuropathy (HCC)   Hyperlipidemia   Essential hypertension   Osteoarthritis of back   Frequent falls   Dehydration with hyponatremia   CAD S/P percutaneous coronary angioplasty/DES (RCA-2014/LAD-2016)   Discharge disposition:  Home  Discharge Condition: Improved.  Diet recommendation: Low sodium, heart healthy.  Carbohydrate-modified  Wound care: None.   History of Present Illness:   Cynthia Singh is a 76 y.o. female with medical history significant for diabetes mellitus on metformin with poor control, hypertension and dyslipidemia, significant osteoarthritis on chronic narcotics. Patient has had 2 significant falls in the past month: The first was several weeks ago when she tripped over her son's book bag and the second was yesterday when she was walking to the door without her walker and did not see the edge of a coffee table and tripped over this and fell. She has not had any dizziness or weakness upon standing. She has noticed she is more tired with walking significant distances.    Hospital Course by Problem:   Subdural bleeding  -Patient presents after sustaining a mechanical fall with reported loss of consciousness for at least 10 minutes with imaging revealing bilateral acute subdural hematoma -appreciate NS-- appointment 1-2 weeks- hold ASA/plavix until  then  Frequent falls -Patient reports several "minor" falls with 2 "major" falls in the past month -No preceding dizziness, weakness or palpitations so does not appear consistent with syncopal etiology -Patient notes has tripped over objects in the floor or tripped against furniture;admits to poor visual acuity and only utilizes reading glasses prnand has not seen an ophthalmologist in many years-poor vision may be contributing to her falls -PT/OT evaluation- home health ordered  Dehydration with hyponatremia -Likely related to concomitant use of thiazide diuretic   CAD S/P percutaneous coronary angioplasty/DES (RCA-2014/LAD-2016) -Currently asymptomatic -After 2016 catheterization cardiologist recommended considering long-term DAPTor monotherapy with Brilintasince she has diabetes and multivessel coronary disease -cardiology consult: She is currently >1 year post PCI, it is ok to discontinue Plavix, however Aspirin will have to be restarted once her SDH is stabilized with a follow up scans. Hold for now. It might not be re-started until after discharge.   Type 2 diabetes, uncontrolled, with neuropathy  -Patient admits to poorly controlled diabetes "for years" -Of note, hemoglobin A1c was 11 in September prior to last stent placement and has decreased to 8 based on reading from August 2017 -HgbA1c 9.1 now -has stopped taking all meds except metformin-- will add glipizide for now  Essential hypertension -resume home meds  Hyperlipidemia -Lipitor  Osteoarthritis of back -Continue preadmission oxycodone with Tylenol     Medical Consultants:    NS   Discharge Exam:   Vitals:   07/21/16 0458 07/21/16 0932  BP: (!) 127/47 119/61  Pulse: (!) 57 (!) 58  Resp: 18 18  Temp: 97.6 F (36.4 C) 98.1 F (36.7 C)   Vitals:   07/20/16 2147 07/21/16 0105  07/21/16 0458 07/21/16 0932  BP: (!) 105/55 108/60 (!) 127/47 119/61  Pulse: 60 66 (!) 57 (!) 58   Resp: 20 18 18 18   Temp: 97.9 F (36.6 C) 97.6 F (36.4 C) 97.6 F (36.4 C) 98.1 F (36.7 C)  TempSrc: Oral Oral Oral Oral  SpO2: 93% 94% 94% 94%    Gen:  NAD    The results of significant diagnostics from this hospitalization (including imaging, microbiology, ancillary and laboratory) are listed below for reference.     Procedures and Diagnostic Studies:   Dg Shoulder Right  Result Date: 07/19/2016 CLINICAL DATA:  Trip and fall with bilateral shoulder pain. EXAM: RIGHT SHOULDER - 2+ VIEW COMPARISON:  None. FINDINGS: There is no evidence of fracture or dislocation. Degenerative change at the acromioclavicular joint. Minimal glenohumeral osteoarthritis. Soft tissues are unremarkable. IMPRESSION: No acute fracture or subluxation of the right shoulder. Electronically Signed   By: Jeb Levering M.D.   On: 07/19/2016 02:28   Ct Head Wo Contrast  Result Date: 07/20/2016 CLINICAL DATA:  76 year old female with history of subdural hematoma. Headache since waking up this morning. EXAM: CT HEAD WITHOUT CONTRAST TECHNIQUE: Contiguous axial images were obtained from the base of the skull through the vertex without intravenous contrast. COMPARISON:  07/19/2016. FINDINGS: Brain: Again noted is high attenuation material all noted lying adjacent to the falx cerebri, predominantly on the left side. This has increased compared to the prior study, measuring up to a maximal thickness of 9 mm (coronal image 38 of series 203). This continues to layer slightly along the left side of the tentorium cerebelli. No significant mass effect. No intraparenchymal hemorrhage. No hydrocephalus. Patchy and confluent areas of decreased attenuation are noted throughout the deep and periventricular white matter of the cerebral hemispheres bilaterally, compatible with chronic microvascular ischemic disease. Vascular: No hyperdense vessel or unexpected calcification. Skull: Normal. Negative for fracture or focal lesion.  Sinuses/Orbits: No acute finding. Other: None. IMPRESSION: 1. Slight increase in size of left-sided subdural hematoma lying predominantly along the falx cerebri with some layering along the left tentorium cerebelli. This currently measures up to 9 mm in maximal thickness. Electronically Signed   By: Vinnie Langton M.D.   On: 07/20/2016 09:00   Ct Head Wo Contrast  Result Date: 07/19/2016 CLINICAL DATA:  Tripped and fell, hitting head on coffee table. Loss of consciousness. Headache. Forehead scalp hematoma. Concern for cervical spine injury. Initial encounter. EXAM: CT HEAD WITHOUT CONTRAST CT CERVICAL SPINE WITHOUT CONTRAST TECHNIQUE: Multidetector CT imaging of the head and cervical spine was performed following the standard protocol without intravenous contrast. Multiplanar CT image reconstructions of the cervical spine were also generated. COMPARISON:  CT of the head performed 06/26/2016, and MRI/MRA of the brain performed 05/20/2009 FINDINGS: CT HEAD FINDINGS Brain: Acute subdural bleed is noted tracking along both sides of the falx cerebri, superiorly and posteriorly. No mass lesion is seen. There is no evidence of acute infarction. There is no evidence of hydrocephalus. Mild cerebellar atrophy is noted. The posterior fossa, including the cerebellum, brainstem and fourth ventricle, is within normal limits. The third and lateral ventricles, and basal ganglia are unremarkable in appearance. The cerebral hemispheres are symmetric in appearance, with normal gray-white differentiation. No mass effect or midline shift is seen. Vascular: No hyperdense vessel or unexpected calcification. Skull: There is no evidence of fracture; visualized osseous structures are unremarkable in appearance. Sinuses/Orbits: The orbits are within normal limits. The paranasal sinuses and mastoid air cells are well-aerated. Other: Mild  soft tissue swelling is noted overlying the high frontal calvarium. CT CERVICAL SPINE FINDINGS  Alignment: Normal. Skull base and vertebrae: No acute fracture. No primary bone lesion or focal pathologic process. Soft tissues and spinal canal: No prevertebral fluid or swelling. No visible canal hematoma. Disc levels: Scattered anterior and posterior disc osteophyte complexes are noted along the cervical spine. Mild degenerative change is noted about the dens. Intervertebral disc spaces are grossly preserved. Upper chest: The thyroid gland is grossly unremarkable in appearance. The visualized lung apices are clear. Calcification is seen at the carotid bifurcations bilaterally, with likely mild to moderate left-sided luminal narrowing. Other: No additional soft tissue abnormalities are seen. IMPRESSION: 1. Acute subdural bleed tracking along both sides of the falx cerebri, superiorly and posteriorly. 2. No evidence of fracture or subluxation along the cervical spine. 3. Mild soft tissue swelling overlying the high frontal calvarium. 4. Mild degenerative change along the cervical spine. 5. Calcification at the carotid bifurcations bilaterally, with likely mild to moderate left-sided luminal narrowing. Carotid ultrasound is recommended for further evaluation, when and as deemed clinically appropriate. Critical Value/emergent results were called by telephone at the time of interpretation on 07/19/2016 at 2:51 am to Dr. [name], who verbally acknowledged these results. Electronically Signed   By: Garald Balding M.D.   On: 07/19/2016 02:52   Ct Cervical Spine Wo Contrast  Result Date: 07/19/2016 CLINICAL DATA:  Tripped and fell, hitting head on coffee table. Loss of consciousness. Headache. Forehead scalp hematoma. Concern for cervical spine injury. Initial encounter. EXAM: CT HEAD WITHOUT CONTRAST CT CERVICAL SPINE WITHOUT CONTRAST TECHNIQUE: Multidetector CT imaging of the head and cervical spine was performed following the standard protocol without intravenous contrast. Multiplanar CT image reconstructions of  the cervical spine were also generated. COMPARISON:  CT of the head performed 06/26/2016, and MRI/MRA of the brain performed 05/20/2009 FINDINGS: CT HEAD FINDINGS Brain: Acute subdural bleed is noted tracking along both sides of the falx cerebri, superiorly and posteriorly. No mass lesion is seen. There is no evidence of acute infarction. There is no evidence of hydrocephalus. Mild cerebellar atrophy is noted. The posterior fossa, including the cerebellum, brainstem and fourth ventricle, is within normal limits. The third and lateral ventricles, and basal ganglia are unremarkable in appearance. The cerebral hemispheres are symmetric in appearance, with normal gray-white differentiation. No mass effect or midline shift is seen. Vascular: No hyperdense vessel or unexpected calcification. Skull: There is no evidence of fracture; visualized osseous structures are unremarkable in appearance. Sinuses/Orbits: The orbits are within normal limits. The paranasal sinuses and mastoid air cells are well-aerated. Other: Mild soft tissue swelling is noted overlying the high frontal calvarium. CT CERVICAL SPINE FINDINGS Alignment: Normal. Skull base and vertebrae: No acute fracture. No primary bone lesion or focal pathologic process. Soft tissues and spinal canal: No prevertebral fluid or swelling. No visible canal hematoma. Disc levels: Scattered anterior and posterior disc osteophyte complexes are noted along the cervical spine. Mild degenerative change is noted about the dens. Intervertebral disc spaces are grossly preserved. Upper chest: The thyroid gland is grossly unremarkable in appearance. The visualized lung apices are clear. Calcification is seen at the carotid bifurcations bilaterally, with likely mild to moderate left-sided luminal narrowing. Other: No additional soft tissue abnormalities are seen. IMPRESSION: 1. Acute subdural bleed tracking along both sides of the falx cerebri, superiorly and posteriorly. 2. No  evidence of fracture or subluxation along the cervical spine. 3. Mild soft tissue swelling overlying the high frontal calvarium.  4. Mild degenerative change along the cervical spine. 5. Calcification at the carotid bifurcations bilaterally, with likely mild to moderate left-sided luminal narrowing. Carotid ultrasound is recommended for further evaluation, when and as deemed clinically appropriate. Critical Value/emergent results were called by telephone at the time of interpretation on 07/19/2016 at 2:51 am to Dr. [name], who verbally acknowledged these results. Electronically Signed   By: Garald Balding M.D.   On: 07/19/2016 02:52   Dg Shoulder Left  Result Date: 07/19/2016 CLINICAL DATA:  Trip and fall with bilateral shoulder pain. EXAM: LEFT SHOULDER - 2+ VIEW COMPARISON:  None. FINDINGS: There is no evidence of fracture or dislocation. Osteoarthritis of the acromioclavicular and glenohumeral joints. Questionable superior subluxation of the humeral head versus artifact, suggesting rotator cuff arthropathy. Soft tissues are unremarkable. IMPRESSION: No acute fracture or subluxation of the left shoulder. Electronically Signed   By: Jeb Levering M.D.   On: 07/19/2016 02:30     Labs:   Basic Metabolic Panel:  Recent Labs Lab 07/19/16 0443 07/19/16 0510 07/19/16 0930 07/19/16 1411 07/20/16 0608  NA 131* 133*  --  133* 134*  K 5.0 5.2*  --  4.2 4.3  CL 94* 99*  --  100* 104  CO2 23  --   --  24 23  GLUCOSE 277* 290*  --  285* 243*  BUN 20 27*  --  15 17  CREATININE 0.74 0.80  --  0.84 1.12*  CALCIUM 9.5  --   --  9.2 8.7*  MG  --   --  2.1  --   --    GFR CrCl cannot be calculated (Unknown ideal weight.). Liver Function Tests:  Recent Labs Lab 07/20/16 0608  AST 15  ALT 12*  ALKPHOS 93  BILITOT 0.7  PROT 6.6  ALBUMIN 3.6   No results for input(s): LIPASE, AMYLASE in the last 168 hours. No results for input(s): AMMONIA in the last 168 hours. Coagulation profile  Recent  Labs Lab 07/19/16 0443  INR 0.93    CBC:  Recent Labs Lab 07/19/16 0443 07/19/16 0510 07/20/16 0608  WBC 12.4*  --  11.7*  NEUTROABS 8.9*  --   --   HGB 13.7 13.9 12.2  HCT 39.9 41.0 36.7  MCV 88.5  --  90.0  PLT 244  --  240   Cardiac Enzymes: No results for input(s): CKTOTAL, CKMB, CKMBINDEX, TROPONINI in the last 168 hours. BNP: Invalid input(s): POCBNP CBG:  Recent Labs Lab 07/20/16 1126 07/20/16 1703 07/20/16 2140 07/21/16 0635 07/21/16 1136  GLUCAP 169* 148* 215* 177* 193*   D-Dimer No results for input(s): DDIMER in the last 72 hours. Hgb A1c  Recent Labs  07/19/16 0718  HGBA1C 9.1*   Lipid Profile No results for input(s): CHOL, HDL, LDLCALC, TRIG, CHOLHDL, LDLDIRECT in the last 72 hours. Thyroid function studies No results for input(s): TSH, T4TOTAL, T3FREE, THYROIDAB in the last 72 hours.  Invalid input(s): FREET3 Anemia work up No results for input(s): VITAMINB12, FOLATE, FERRITIN, TIBC, IRON, RETICCTPCT in the last 72 hours. Microbiology No results found for this or any previous visit (from the past 240 hour(s)).   Discharge Instructions:   Discharge Instructions    Diet - low sodium heart healthy    Complete by:  As directed    Diet Carb Modified    Complete by:  As directed    Discharge instructions    Complete by:  As directed    Home health with family  supervision Hold ASA and plavix until seen by Neurosurgery in 2 weeks keppra x 7 days Monitor blood sugars 2x/day and bring log to PCP   Increase activity slowly    Complete by:  As directed      Allergies as of 07/21/2016   No Known Allergies     Medication List    STOP taking these medications   aspirin 81 MG EC tablet   clopidogrel 75 MG tablet Commonly known as:  PLAVIX     TAKE these medications   amitriptyline 150 MG tablet Commonly known as:  ELAVIL TAKE ONE TABLET BY MOUTH EVERY NIGHT AT BEDTIME   atorvastatin 80 MG tablet Commonly known as:  LIPITOR Take  0.5 tablets (40 mg total) by mouth daily.   carvedilol 12.5 MG tablet Commonly known as:  COREG Take 1 tablet (12.5 mg total) by mouth 2 (two) times daily with a meal.   glipiZIDE 5 MG tablet Commonly known as:  GLUCOTROL Take 1 tablet (5 mg total) by mouth 2 (two) times daily before a meal.   levETIRAcetam 500 MG tablet Commonly known as:  KEPPRA Take 1 tablet (500 mg total) by mouth 2 (two) times daily.   losartan-hydrochlorothiazide 100-12.5 MG tablet Commonly known as:  HYZAAR Take 1 tablet by mouth daily.   metFORMIN 1000 MG tablet Commonly known as:  GLUCOPHAGE TAKE 1 TABLET BY MOUTH TWICE A DAY WITH A MEAL   nitroGLYCERIN 0.4 MG SL tablet Commonly known as:  NITROSTAT Place 1 tablet (0.4 mg total) under the tongue every 5 (five) minutes as needed for chest pain.   oxyCODONE-acetaminophen 10-325 MG tablet Commonly known as:  PERCOCET Take 1-2 tablets by mouth every 6 (six) hours as needed for pain.   polyethylene glycol packet Commonly known as:  MIRALAX / GLYCOLAX Take 17 g by mouth daily as needed for mild constipation.   sennosides-docusate sodium 8.6-50 MG tablet Commonly known as:  SENOKOT-S Take 2 tablets by mouth daily as needed for constipation.      Follow-up Information    COSTELLA, VINCENT J, PA-C. Schedule an appointment as soon as possible for a visit in 2 week(s).   Specialty:  Physician Assistant Contact information: [place] [address] [phone]        [name], MD Follow up in 1 week(s).   Specialties:  Internal Medicine, Pediatrics Why:  patient has stopped her insulin sometime after July Contact information: [place] [address] [phone]            Time coordinating discharge: 35 min  Signed:  [place]   [facility] 07/21/2016, 12:11 PM

## 2016-07-21 NOTE — Progress Notes (Signed)
Patient given discharge instructions.  All questions and concerns addressed.  IV catheter removed without difficulty. Patient left unit with all belongings in wheelchair accompanied by staff.

## 2016-07-21 NOTE — Care Management Note (Signed)
Case Management Note  Patient Details  Name: Cynthia Singh MRN: [id number] Date of Birth: 1940/07/17  Subjective/Objective:                    Action/Plan: Pt states she lives with her son and granddaughter. She states that she is very rarely alone between their different work schedules. Pt discharging home with orders for [facility] services. CM met with the patient and provided her a list of [facility] agencies. She selected [facility]. [name] with [facility] notified and accepted the referral.  Pt with orders for walker and 3 in 1. Brad with [facility] notified and delivered the equipment to the room.  Pt has transportation home.  Expected Discharge Date:  07/21/16               Expected Discharge Plan:  [place]  In-House Referral:     Discharge planning Services  CM Consult  Post Acute Care Choice:  Durable Medical Equipment, Home Health Choice offered to:  Patient  DME Arranged:  3-N-1, Walker rolling DME Agency:  [place]:  PT, OT, RN [facility] Agency:  [place]  Status of Service:  Completed, signed off  If discussed at [place] of Stay Meetings, dates discussed:    Additional Comments:  Pollie Friar, RN 07/21/2016, 6:52 PM

## 2016-07-21 NOTE — Consult Note (Signed)
CC:  Chief Complaint  Patient presents with  . Fall    HPI: Cynthia Singh is a 76 y.o. female who presented to the ER s/p head injury. She reports she was getting up to let daughter into home and tripped, hitting head on wooden table. She states her daughter reports LOC <5 seconds but she does not believe that is the case. No seizure like activity, no urinary incontinence. Has since been having headaches. Frontal headaches. Have been improving. No issues with memory. She denies slurring speech, changes in vision, one sided weakness, dizziness, motor/sensory deficits. On Plavix and daily aspirin.  PMH: Past Medical History:  Diagnosis Date  . CAD (coronary artery disease)    a. Remote nonobstructive disease but in 10/2012 Cath/PCI: s/p DES to RCA. b. cath 02/08/15 DES to prox LAD and balloon angioplasty of ost D1, EF 55-65%.  . Diabetes mellitus, type 2 ([place])   . Dyslipidemia   . Episodic mood disorder ([place])   . Hypertension   . Obesity   . Osteoarthritis of spine    knees also    PSH: Past Surgical History:  Procedure Laterality Date  . APPENDECTOMY  1959  . CARDIAC CATHETERIZATION N/A 02/08/2015   Procedure: Left Heart Cath and Coronary Angiography;  Surgeon: Sherren Mocha, MD;  Location: [place] [facility];  Service: Cardiovascular;  Laterality: N/A;  . CARDIAC CATHETERIZATION N/A 02/08/2015   Procedure: Coronary Stent Intervention;  Surgeon: Sherren Mocha, MD;  Location: [place] [facility];  Service: Cardiovascular;  Laterality: N/A;  . CARDIAC CATHETERIZATION N/A 02/08/2015   Procedure: Intravascular Pressure Wire/FFR Study;  Surgeon: Sherren Mocha, MD;  Location: [place] [facility];  Service: Cardiovascular;  Laterality: N/A;  . CHOLECYSTECTOMY    . COMBINED HYSTERECTOMY ABDOMINAL W/ A&P REPAIR / OOPHORECTOMY  1968  . CORONARY STENT PLACEMENT  2014  . [place]  . LEFT HEART CATHETERIZATION WITH CORONARY ANGIOGRAM N/A 10/20/2012   Procedure: LEFT HEART  CATHETERIZATION WITH CORONARY ANGIOGRAM;  Surgeon: Sherren Mocha, MD;  [facility]: [facility] CATH LAB;  Service: Cardiovascular;  Laterality: N/A;  . multiple D&C    . TONSILLECTOMY  age 38    SH: Social History  Substance Use Topics  . Smoking status: Never Smoker  . Smokeless tobacco: Never Used  . Alcohol use No    MEDS: Prior to Admission medications   Medication Sig Start Date End Date Taking? Authorizing Provider  amitriptyline (ELAVIL) 150 MG tablet TAKE ONE TABLET BY MOUTH EVERY NIGHT AT BEDTIME 06/26/16  Yes Venia Carbon, MD  aspirin EC 81 MG EC tablet Take 1 tablet (81 mg total) by mouth daily. 10/21/12  Yes Neena Rhymes, MD  atorvastatin (LIPITOR) 80 MG tablet Take 0.5 tablets (40 mg total) by mouth daily. 02/09/15  Yes Almyra Deforest, PA  carvedilol (COREG) 12.5 MG tablet Take 1 tablet (12.5 mg total) by mouth 2 (two) times daily with a meal. 04/16/16  Yes Venia Carbon, MD  clopidogrel (PLAVIX) 75 MG tablet TAKE 1 TABLET BY MOUTH DAILY 05/21/16  Yes Mihai Croitoru, MD  losartan-hydrochlorothiazide (HYZAAR) 100-12.5 MG tablet Take 1 tablet by mouth daily. 12/06/15  Yes Venia Carbon, MD  metFORMIN (GLUCOPHAGE) 1000 MG tablet TAKE 1 TABLET BY MOUTH TWICE A DAY WITH A MEAL 01/27/16  Yes Venia Carbon, MD  nitroGLYCERIN (NITROSTAT) 0.4 MG SL tablet Place 1 tablet (0.4 mg total) under the tongue every 5 (five) minutes as needed for chest pain. 11/26/12  Yes  Lendon Colonel, NP  oxyCODONE-acetaminophen (PERCOCET) 10-325 MG tablet Take 1-2 tablets by mouth every 6 (six) hours as needed for pain. 07/03/16  Yes Venia Carbon, MD  polyethylene glycol [facility] / GLYCOLAX) packet Take 17 g by mouth daily as needed for mild constipation.    Yes Historical Provider, MD  sennosides-docusate sodium (SENOKOT-S) 8.6-50 MG tablet Take 2 tablets by mouth daily as needed for constipation.   Yes Historical Provider, MD    ALLERGY: No Known Allergies  ROS: Review of Systems  Constitutional:  Negative for chills and fever.  HENT: Negative.   Eyes: Negative.   Respiratory: Negative.   Cardiovascular: Negative.   Gastrointestinal: Negative.   Musculoskeletal: Negative for back pain, myalgias and neck pain.  Neurological: Positive for loss of consciousness and headaches. Negative for dizziness, tingling, tremors, sensory change, speech change, focal weakness and seizures.    Vitals:   07/21/16 0105 07/21/16 0458  BP: 108/60 (!) 127/47  Pulse: 66 (!) 57  Resp: 18 18  Temp: 97.6 F (36.4 C) 97.6 F (36.4 C)   General appearance: NAD, resting comfortably Eyes: PERRL Cardiovascular: Regular rate and rhythm without murmurs, rubs, gallops. No edema or variciosities. Distal pulses normal. Pulmonary: Clear to auscultation Musculoskeletal:     Muscle tone upper extremities: Normal    Muscle tone lower extremities: Normal    Motor exam: Upper Extremities Deltoid Bicep Tricep Grip  Right 5/5 5/5 5/5 5/5  Left 5/5 5/5 5/5 5/5   Lower Extremity IP Quad PF DF EHL  Right 5/5 5/5 5/5 5/5 5/5  Left 5/5 5/5 5/5 5/5 5/5   Neurological Awake, alert, oriented Memory and concentration grossly intact Speech fluent, appropriate CNII: Visual fields normal CNIII/IV/VI: EOMI CNV: Facial sensation normal CNVII: Symmetric, normal strength CNVIII: Grossly normal CNIX: Normal palate movement CNXI: Trap and SCM strength normal CN XII: Tongue protrusion normal Sensation grossly intact to LT DTR: Normal Coordination (finger/nose & heel/shin): Normal  IMAGING: CT HEAD: 07/19/2016 IMPRESSION: 1. Acute subdural bleed tracking along both sides of the falx cerebri, superiorly and posteriorly. 2. No evidence of fracture or subluxation along the cervical spine. 3. Mild soft tissue swelling overlying the high frontal calvarium. 4. Mild degenerative change along the cervical spine. 5. Calcification at the carotid bifurcations bilaterally, with likely mild to moderate left-sided luminal  narrowing. Carotid ultrasound is recommended for further evaluation, when and as deemed clinically appropriate.  CT Head 07/20/2016 IMPRESSION: 1. Slight increase in size of left-sided subdural hematoma lying predominantly along the falx cerebri with some layering along the left tentorium cerebelli. This currently measures up to 9 mm in maximal thickness.  IMPRESSION: - 76 y.o. female with small subdural hematoma - slight worsening overnight from 3/4 to 3/5. She is neurologically intact.  PLAN: - She continues to remain neurologically intact. She is without neurological symptoms with the exception of headache that is improving.  No neurosurgical intervention or repeat imaging is necessary at this time.  - F/U in office in 2-4 weeks. Sooner as necessary. Continue to hold Plavix until appt. - Will start on Keppra 500mg  BID x7days - Discussed at length worrisome signs and symptoms for when to seek urgent medical attention. Pt states understanding.

## 2016-07-23 ENCOUNTER — Telehealth: Payer: Self-pay | Admitting: *Deleted

## 2016-07-23 DIAGNOSIS — Z79891 Long term (current) use of opiate analgesic: Secondary | ICD-10-CM | POA: Diagnosis not present

## 2016-07-23 DIAGNOSIS — R296 Repeated falls: Secondary | ICD-10-CM | POA: Diagnosis not present

## 2016-07-23 DIAGNOSIS — E785 Hyperlipidemia, unspecified: Secondary | ICD-10-CM | POA: Diagnosis not present

## 2016-07-23 DIAGNOSIS — S065X9D Traumatic subdural hemorrhage with loss of consciousness of unspecified duration, subsequent encounter: Secondary | ICD-10-CM | POA: Diagnosis not present

## 2016-07-23 DIAGNOSIS — I2510 Atherosclerotic heart disease of native coronary artery without angina pectoris: Secondary | ICD-10-CM | POA: Diagnosis not present

## 2016-07-23 DIAGNOSIS — E1140 Type 2 diabetes mellitus with diabetic neuropathy, unspecified: Secondary | ICD-10-CM | POA: Diagnosis not present

## 2016-07-23 DIAGNOSIS — I10 Essential (primary) hypertension: Secondary | ICD-10-CM | POA: Diagnosis not present

## 2016-07-23 DIAGNOSIS — M4300 Spondylolysis, site unspecified: Secondary | ICD-10-CM | POA: Diagnosis not present

## 2016-07-23 DIAGNOSIS — Z7984 Long term (current) use of oral hypoglycemic drugs: Secondary | ICD-10-CM | POA: Diagnosis not present

## 2016-07-23 NOTE — Telephone Encounter (Signed)
(  [place]) Active Problems:   Type 2 diabetes, uncontrolled, with neuropathy ([place])   Hyperlipidemia   Essential hypertension   Osteoarthritis of back   Frequent falls   Dehydration with hyponatremia   CAD S/P percutaneous coronary angioplasty/DES (RCA-2014/LAD-2016)   Discharge disposition:  Home  Discharge Condition: Improved.  Diet recommendation: Low sodium, heart healthy.  Carbohydrate-modified  Wound care: None.  Transition Care Management Follow-up Telephone Call    How have you been since you were released from the hospital? "ok I guess"   Do you understand why you were in the hospital? yes   Do you understand the discharge instructions? yes   Where were you discharged to? Home   Items Reviewed:  Medications reviewed: yes  Allergies reviewed: yes  Dietary changes reviewed: yes  Referrals reviewed: yes   Functional Questionnaire:   Activities of Daily Living (ADLs):   She states they are independent in the following: ambulation, bathing and hygiene, feeding, continence, grooming, toileting and dressing States they require assistance with the following: none per pt   Any transportation issues/concerns?: no   Any patient concerns? no   Confirmed importance and date/time of follow-up visits scheduled yes  Provider Appointment booked with [facility] 07/31/16 @11 .  Confirmed with patient if condition begins to worsen call PCP or go to the ER.  Patient was given the office number and encouraged to call back with question or concerns.  : yes

## 2016-07-24 ENCOUNTER — Other Ambulatory Visit: Payer: Self-pay | Admitting: Cardiovascular Disease

## 2016-07-31 ENCOUNTER — Ambulatory Visit (INDEPENDENT_AMBULATORY_CARE_PROVIDER_SITE_OTHER): Payer: PPO | Admitting: Internal Medicine

## 2016-07-31 ENCOUNTER — Encounter (INDEPENDENT_AMBULATORY_CARE_PROVIDER_SITE_OTHER): Payer: Self-pay

## 2016-07-31 ENCOUNTER — Encounter: Payer: Self-pay | Admitting: Internal Medicine

## 2016-07-31 VITALS — BP 146/86 | HR 94 | Temp 97.8°F | Wt 188.5 lb

## 2016-07-31 DIAGNOSIS — F39 Unspecified mood [affective] disorder: Secondary | ICD-10-CM

## 2016-07-31 DIAGNOSIS — E1165 Type 2 diabetes mellitus with hyperglycemia: Secondary | ICD-10-CM

## 2016-07-31 DIAGNOSIS — IMO0002 Reserved for concepts with insufficient information to code with codable children: Secondary | ICD-10-CM

## 2016-07-31 DIAGNOSIS — M4715 Other spondylosis with myelopathy, thoracolumbar region: Secondary | ICD-10-CM

## 2016-07-31 DIAGNOSIS — I6200 Nontraumatic subdural hemorrhage, unspecified: Secondary | ICD-10-CM | POA: Diagnosis not present

## 2016-07-31 DIAGNOSIS — E1140 Type 2 diabetes mellitus with diabetic neuropathy, unspecified: Secondary | ICD-10-CM | POA: Diagnosis not present

## 2016-07-31 MED ORDER — OXYCODONE-ACETAMINOPHEN 10-325 MG PO TABS: 1.0000 | ORAL_TABLET | Freq: Four times a day (QID) | ORAL | 0 refills | Status: DC | PRN

## 2016-07-31 NOTE — Progress Notes (Signed)
Pre visit review using our clinic review tool, if applicable. No additional management support is needed unless otherwise documented below in the visit note. 

## 2016-07-31 NOTE — Assessment & Plan Note (Signed)
Chronic oxycodone CSRS shows no Rx other than mine

## 2016-07-31 NOTE — Assessment & Plan Note (Signed)
Neuro exam reassuring Seeing neurosurg next week Working with PT--can probably get off the walker Still off asa and plavix till NS visit

## 2016-07-31 NOTE — Assessment & Plan Note (Signed)
Off insulin and now on glipizide Will check labs in 3 months with this change (hadn't been taking the insulin for some time)

## 2016-07-31 NOTE — Progress Notes (Signed)
Subjective:    Patient ID: Cynthia Singh, female    DOB: 1940-09-20, 76 y.o.   MRN: [id number]  HPI Here for hospital follow up With granddaughter (who she raised)  [place] twice--tripped over book bag in the dark Second time-- had been trying to get up after being in bed Tangled in pocketbook (had already taken her amitriptyline) Had LOC of at least 10 minutes To ER  Bilateral subdural hematomas Had headache then---yesterday first day she hasn't had one Left eye is worse in vision (but can still see--just blurry) No arm or leg weakness No aphasia No treatment Hospital records reviewed  Still some coordination issues Off the ASA/plavix still--- seeing neurosurg next week Using walker for now Getting home PT  Had stopped the insulin--ran out (long ago) Hasn't seen the endocrinologist for some time Checks sugar every other day or so--- 130-170 fasting  Still with chronic pain Uses the oxycodone regularly still  Still chronic mood issues Chronic depression and anxiety Doesn't feel she can decrease the amitriptyline  Current Outpatient Prescriptions on File Prior to Visit  Medication Sig Dispense Refill  . amitriptyline (ELAVIL) 150 MG tablet TAKE ONE TABLET BY MOUTH EVERY NIGHT AT BEDTIME 90 tablet 0  . atorvastatin (LIPITOR) 80 MG tablet Take 0.5 tablets (40 mg total) by mouth daily. 90 tablet 3  . carvedilol (COREG) 12.5 MG tablet Take 1 tablet (12.5 mg total) by mouth 2 (two) times daily with a meal. 60 tablet 5  . clopidogrel (PLAVIX) 75 MG tablet TAKE 1 TABLET BY MOUTH DAILY 40 tablet 1  . glipiZIDE (GLUCOTROL) 5 MG tablet Take 1 tablet (5 mg total) by mouth 2 (two) times daily before a meal. 60 tablet 0  . levETIRAcetam (KEPPRA) 500 MG tablet Take 1 tablet (500 mg total) by mouth 2 (two) times daily. 14 tablet 0  . losartan-hydrochlorothiazide (HYZAAR) 100-12.5 MG tablet Take 1 tablet by mouth daily. 90 tablet 3  . metFORMIN (GLUCOPHAGE) 1000 MG tablet TAKE 1  TABLET BY MOUTH TWICE A DAY WITH A MEAL 60 tablet 5  . nitroGLYCERIN (NITROSTAT) 0.4 MG SL tablet Place 1 tablet (0.4 mg total) under the tongue every 5 (five) minutes as needed for chest pain. 30 tablet 12  . oxyCODONE-acetaminophen (PERCOCET) 10-325 MG tablet Take 1-2 tablets by mouth every 6 (six) hours as needed for pain. 180 tablet 0  . polyethylene glycol (MIRALAX / GLYCOLAX) packet Take 17 g by mouth daily as needed for mild constipation.     . sennosides-docusate sodium (SENOKOT-S) 8.6-50 MG tablet Take 2 tablets by mouth daily as needed for constipation.     No current facility-administered medications on file prior to visit.     No Known Allergies  Past Medical History:  Diagnosis Date  . CAD (coronary artery disease)    a. Remote nonobstructive disease but in 10/2012 Cath/PCI: s/p DES to RCA. b. cath 02/08/15 DES to prox LAD and balloon angioplasty of ost D1, EF 55-65%.  . Diabetes mellitus, type 2 ([place])   . Dyslipidemia   . Episodic mood disorder ([place])   . Hypertension   . Obesity   . Osteoarthritis of spine    knees also    Past Surgical History:  Procedure Laterality Date  . APPENDECTOMY  1959  . CARDIAC CATHETERIZATION N/A 02/08/2015   Procedure: Left Heart Cath and Coronary Angiography;  Surgeon: Sherren Mocha, MD;  Location: [place] [facility];  Service: Cardiovascular;  Laterality: N/A;  . CARDIAC CATHETERIZATION N/A  02/08/2015   Procedure: Coronary Stent Intervention;  Surgeon: Sherren Mocha, MD;  Location: [place] [facility];  Service: Cardiovascular;  Laterality: N/A;  . CARDIAC CATHETERIZATION N/A 02/08/2015   Procedure: Intravascular Pressure Wire/FFR Study;  Surgeon: Sherren Mocha, MD;  Location: [place] [facility];  Service: Cardiovascular;  Laterality: N/A;  . CHOLECYSTECTOMY    . COMBINED HYSTERECTOMY ABDOMINAL W/ A&P REPAIR / OOPHORECTOMY  1968  . CORONARY STENT PLACEMENT  2014  . [place]  . LEFT HEART CATHETERIZATION WITH CORONARY  ANGIOGRAM N/A 10/20/2012   Procedure: LEFT HEART CATHETERIZATION WITH CORONARY ANGIOGRAM;  Surgeon: Sherren Mocha, MD;  Location: [facility] CATH LAB;  Service: Cardiovascular;  Laterality: N/A;  . multiple D&C    . TONSILLECTOMY  age 75    Family History  Problem Relation Age of Onset  . Cancer Brother     Colon  . Coronary artery disease Neg Hx   . Heart attack Neg Hx     Social History   Social History  . Marital status: Widowed    Spouse name: N/A  . Number of children: 3  . Years of education: 12   Occupational History  . CASHIER    Social History Main Topics  . Smoking status: Never Smoker  . Smokeless tobacco: Never Used  . Alcohol use No  . Drug use: No  . Sexual activity: No   Other Topics Concern  . Not on file   Social History Narrative   Widowed '13. 2 sons- '63, '66; one daughter-'69;    87 grandchildren--has raised 9 of her grandchildren 4 great grandchildren.   Lives with 2 granddaughters and adoptive son      Has living will   Probably would have son Kayce Chismar as health care POA   Would accept resuscitation but no prolonged ventilation   Not sure about tube feeds   Review of Systems  Appetite is okay but not great Weight about the same Sleeps okay     Objective:   Physical Exam  Neck: No thyromegaly present.  Cardiovascular: Normal rate, regular rhythm and normal heart sounds.  Exam reveals no gallop.   No murmur heard. Pulmonary/Chest: Effort normal and breath sounds normal. No respiratory distress. She has no wheezes. She has no rales.  Musculoskeletal: She exhibits no edema.  Lymphadenopathy:    She has no cervical adenopathy.  Neurological:  No focal weakness CN normal Normal tone Gait okay with walker Romberg absent          Assessment & Plan:

## 2016-07-31 NOTE — Assessment & Plan Note (Signed)
Discussed trying lower dose--she doesn't think it will be good

## 2016-08-04 DIAGNOSIS — M4300 Spondylolysis, site unspecified: Secondary | ICD-10-CM | POA: Diagnosis not present

## 2016-08-04 DIAGNOSIS — E1140 Type 2 diabetes mellitus with diabetic neuropathy, unspecified: Secondary | ICD-10-CM | POA: Diagnosis not present

## 2016-08-04 DIAGNOSIS — I10 Essential (primary) hypertension: Secondary | ICD-10-CM | POA: Diagnosis not present

## 2016-08-04 DIAGNOSIS — E785 Hyperlipidemia, unspecified: Secondary | ICD-10-CM | POA: Diagnosis not present

## 2016-08-04 DIAGNOSIS — S065X9D Traumatic subdural hemorrhage with loss of consciousness of unspecified duration, subsequent encounter: Secondary | ICD-10-CM | POA: Diagnosis not present

## 2016-08-04 DIAGNOSIS — Z79891 Long term (current) use of opiate analgesic: Secondary | ICD-10-CM | POA: Diagnosis not present

## 2016-08-04 DIAGNOSIS — I2510 Atherosclerotic heart disease of native coronary artery without angina pectoris: Secondary | ICD-10-CM | POA: Diagnosis not present

## 2016-08-04 DIAGNOSIS — Z7984 Long term (current) use of oral hypoglycemic drugs: Secondary | ICD-10-CM | POA: Diagnosis not present

## 2016-08-04 DIAGNOSIS — R296 Repeated falls: Secondary | ICD-10-CM | POA: Diagnosis not present

## 2016-08-05 ENCOUNTER — Telehealth: Payer: Self-pay

## 2016-08-05 ENCOUNTER — Ambulatory Visit: Payer: PPO | Admitting: Internal Medicine

## 2016-08-05 NOTE — Telephone Encounter (Signed)
Landon at [facility] left v/m requesting med list copy faxed to [facility] at [phone]; Harmon Pier doing med review that pt is compliant with meds. Med list faxed as requested.

## 2016-08-06 NOTE — Telephone Encounter (Signed)
landon at [facility] left v/m when reviewing med list with pt; the pt did not remember taking the atorvastatin and [facility] has not filled since 2016. Is pt supposed to be taking atorvastatin? Landon request cb.

## 2016-08-06 NOTE — Telephone Encounter (Signed)
She is supposed to be on it. Please check with her about this

## 2016-08-07 NOTE — Telephone Encounter (Signed)
okay

## 2016-08-07 NOTE — Telephone Encounter (Signed)
Spoke to pt. She said she found the bottle of the medication. It has been a few months since she took it. She started it back yesterday.

## 2016-08-31 ENCOUNTER — Encounter: Payer: PPO | Admitting: Internal Medicine

## 2016-08-31 ENCOUNTER — Other Ambulatory Visit: Payer: Self-pay

## 2016-08-31 MED ORDER — OXYCODONE-ACETAMINOPHEN 10-325 MG PO TABS: 1.0000 | ORAL_TABLET | Freq: Four times a day (QID) | ORAL | 0 refills | Status: DC | PRN

## 2016-08-31 NOTE — Telephone Encounter (Signed)
Spoke to pt and informed her Rx is available for pickup from the front desk. Pt advised third party unable to pickup 

## 2016-08-31 NOTE — Telephone Encounter (Signed)
Pt left v/m requesting rx oxycodone apap. Call when ready for pick up.Pt last seen and rx last printed # 180 on 07/31/16.

## 2016-09-01 ENCOUNTER — Other Ambulatory Visit: Payer: PPO

## 2016-09-01 ENCOUNTER — Encounter: Payer: Self-pay | Admitting: Internal Medicine

## 2016-09-01 DIAGNOSIS — Z0283 Encounter for blood-alcohol and blood-drug test: Secondary | ICD-10-CM | POA: Diagnosis not present

## 2016-09-04 LAB — TOXASSURE SELECT 13 (MW), URINE

## 2016-09-07 ENCOUNTER — Encounter: Payer: Self-pay | Admitting: *Deleted

## 2016-09-26 ENCOUNTER — Other Ambulatory Visit: Payer: Self-pay | Admitting: Internal Medicine

## 2016-09-30 ENCOUNTER — Other Ambulatory Visit: Payer: Self-pay

## 2016-09-30 MED ORDER — OXYCODONE-ACETAMINOPHEN 10-325 MG PO TABS: 1.0000 | ORAL_TABLET | Freq: Four times a day (QID) | ORAL | 0 refills | Status: DC | PRN

## 2016-09-30 NOTE — Telephone Encounter (Signed)
Spoke to pt. Rx up front ready for pickup 

## 2016-09-30 NOTE — Telephone Encounter (Signed)
Pt left v/m requesting oxycodone apap rx (last printed # 180 on 08/31/16) pt last seen 07/31/16. Call when rx ready for pick up.

## 2016-10-01 ENCOUNTER — Other Ambulatory Visit: Payer: Self-pay

## 2016-10-01 MED ORDER — GLIPIZIDE 5 MG PO TABS: 5.0000 mg | ORAL_TABLET | Freq: Two times a day (BID) | ORAL | 0 refills | Status: DC

## 2016-10-01 MED ORDER — LEVETIRACETAM 500 MG PO TABS: 500.0000 mg | ORAL_TABLET | Freq: Two times a day (BID) | ORAL | 0 refills | Status: DC

## 2016-10-29 ENCOUNTER — Encounter: Payer: Self-pay | Admitting: Internal Medicine

## 2016-10-29 ENCOUNTER — Ambulatory Visit (INDEPENDENT_AMBULATORY_CARE_PROVIDER_SITE_OTHER): Payer: PPO | Admitting: Internal Medicine

## 2016-10-29 VITALS — BP 110/80 | HR 77 | Temp 97.5°F | Ht 68.0 in | Wt 191.0 lb

## 2016-10-29 DIAGNOSIS — Z0000 Encounter for general adult medical examination without abnormal findings: Secondary | ICD-10-CM | POA: Diagnosis not present

## 2016-10-29 DIAGNOSIS — M5442 Lumbago with sciatica, left side: Secondary | ICD-10-CM | POA: Diagnosis not present

## 2016-10-29 DIAGNOSIS — H5462 Unqualified visual loss, left eye, normal vision right eye: Secondary | ICD-10-CM | POA: Diagnosis not present

## 2016-10-29 DIAGNOSIS — F39 Unspecified mood [affective] disorder: Secondary | ICD-10-CM

## 2016-10-29 DIAGNOSIS — M5441 Lumbago with sciatica, right side: Secondary | ICD-10-CM | POA: Diagnosis not present

## 2016-10-29 DIAGNOSIS — F1120 Opioid dependence, uncomplicated: Secondary | ICD-10-CM | POA: Insufficient documentation

## 2016-10-29 DIAGNOSIS — E1165 Type 2 diabetes mellitus with hyperglycemia: Secondary | ICD-10-CM | POA: Diagnosis not present

## 2016-10-29 DIAGNOSIS — I25119 Atherosclerotic heart disease of native coronary artery with unspecified angina pectoris: Secondary | ICD-10-CM | POA: Diagnosis not present

## 2016-10-29 DIAGNOSIS — G8929 Other chronic pain: Secondary | ICD-10-CM

## 2016-10-29 DIAGNOSIS — E1140 Type 2 diabetes mellitus with diabetic neuropathy, unspecified: Secondary | ICD-10-CM

## 2016-10-29 DIAGNOSIS — I6200 Nontraumatic subdural hemorrhage, unspecified: Secondary | ICD-10-CM

## 2016-10-29 DIAGNOSIS — IMO0002 Reserved for concepts with insufficient information to code with codable children: Secondary | ICD-10-CM

## 2016-10-29 LAB — HM DIABETES FOOT EXAM

## 2016-10-29 MED ORDER — OXYCODONE-ACETAMINOPHEN 10-325 MG PO TABS: 1.0000 | ORAL_TABLET | Freq: Four times a day (QID) | ORAL | 0 refills | Status: DC | PRN

## 2016-10-29 NOTE — Assessment & Plan Note (Signed)
I have personally reviewed the Medicare Annual Wellness questionnaire and have noted 1. The patient's medical and social history 2. Their use of alcohol, tobacco or illicit drugs 3. Their current medications and supplements 4. The patient's functional ability including ADL's, fall risks, home safety risks and hearing or visual             impairment. 5. Diet and physical activities 6. Evidence for depression or mood disorders  The patients weight, height, BMI and visual acuity have been recorded in the chart I have made referrals, counseling and provided education to the patient based review of the above and I have provided the pt with a written personalized care plan for preventive services.  I have provided you with a copy of your personalized plan for preventive services. Please take the time to review along with your updated medication list.  Will consider shingrix next year Discussed cancer screening--she wishes to defer these (likely permanent) Unable to do sig exercise

## 2016-10-29 NOTE — Progress Notes (Signed)
Subjective:    Patient ID: Cynthia Singh, female    DOB: [birth year], 76 y.o.   MRN: [id number]  HPI Here for Medicare wellness and follow up of chronic health conditions Reviewed form and advanced directives Reviewed other doctors--none No tobacco or alcohol Had vision loss  Hearing is not great---high frequency loss bilaterally Tries to do home exercise program--but not much due to leg pain Had fall with injury No regular depression --but still grieving for daughter who died in past year. Not anhedonic Still does her housework No significant memory loss  Awoke ~4 weeks ago and couldn't see out of left eye Just decided to wait till this appointment Still has some headaches--mostly on left side No significant trouble with coordination--just having trouble with left leg due to pain No seizures  Ongoing pain issues  Checks sugars at least once a day Still over 200 even fasting Trying to eat right--avoids sugar Ongoing numbness in feet---pain is worse  No recent chest pain No SOB No dizziness or syncope No edema  Current Outpatient Prescriptions on File Prior to Visit  Medication Sig Dispense Refill  . amitriptyline (ELAVIL) 150 MG tablet TAKE ONE TABLET BY MOUTH EVERY NIGHT AT BEDTIME 90 tablet 0  . atorvastatin (LIPITOR) 80 MG tablet Take 0.5 tablets (40 mg total) by mouth daily. 90 tablet 3  . carvedilol (COREG) 12.5 MG tablet Take 1 tablet (12.5 mg total) by mouth 2 (two) times daily with a meal. 60 tablet 5  . clopidogrel (PLAVIX) 75 MG tablet TAKE 1 TABLET BY MOUTH DAILY 40 tablet 1  . glipiZIDE (GLUCOTROL) 5 MG tablet Take 1 tablet (5 mg total) by mouth 2 (two) times daily before a meal. 60 tablet 0  . levETIRAcetam (KEPPRA) 500 MG tablet Take 1 tablet (500 mg total) by mouth 2 (two) times daily. 60 tablet 0  . losartan-hydrochlorothiazide (HYZAAR) 100-12.5 MG tablet Take 1 tablet by mouth daily. 90 tablet 3  . metFORMIN (GLUCOPHAGE) 1000 MG tablet TAKE 1 TABLET  BY MOUTH TWICE A DAY WITH A MEAL 60 tablet 5  . nitroGLYCERIN (NITROSTAT) 0.4 MG SL tablet Place 1 tablet (0.4 mg total) under the tongue every 5 (five) minutes as needed for chest pain. 30 tablet 12  . oxyCODONE-acetaminophen (PERCOCET) 10-325 MG tablet Take 1-2 tablets by mouth every 6 (six) hours as needed for pain. 180 tablet 0  . polyethylene glycol (MIRALAX / GLYCOLAX) packet Take 17 g by mouth daily as needed for mild constipation.     . sennosides-docusate sodium (SENOKOT-S) 8.6-50 MG tablet Take 2 tablets by mouth daily as needed for constipation.     No current facility-administered medications on file prior to visit.     No Known Allergies  Past Medical History:  Diagnosis Date  . CAD (coronary artery disease)    a. Remote nonobstructive disease but in 10/2012 Cath/PCI: s/p DES to RCA. b. cath 02/08/15 DES to prox LAD and balloon angioplasty of ost D1, EF 55-65%.  . Diabetes mellitus, type 2 ([place])   . Dyslipidemia   . Episodic mood disorder ([place])   . Hypertension   . Obesity   . Osteoarthritis of spine    knees also    Past Surgical History:  Procedure Laterality Date  . APPENDECTOMY  1959  . CARDIAC CATHETERIZATION N/A 02/08/2015   Procedure: Left Heart Cath and Coronary Angiography;  Surgeon: Sherren Mocha, MD;  Location: [place] [facility];  Service: Cardiovascular;  Laterality: N/A;  . CARDIAC CATHETERIZATION  N/A 02/08/2015   Procedure: Coronary Stent Intervention;  Surgeon: Sherren Mocha, MD;  Location: [place] [facility];  Service: Cardiovascular;  Laterality: N/A;  . CARDIAC CATHETERIZATION N/A 02/08/2015   Procedure: Intravascular Pressure Wire/FFR Study;  Surgeon: Sherren Mocha, MD;  Location: [place] [facility];  Service: Cardiovascular;  Laterality: N/A;  . CHOLECYSTECTOMY    . COMBINED HYSTERECTOMY ABDOMINAL W/ A&P REPAIR / OOPHORECTOMY  1968  . CORONARY STENT PLACEMENT  2014  . [place]  . LEFT HEART CATHETERIZATION WITH CORONARY ANGIOGRAM  N/A 10/20/2012   Procedure: LEFT HEART CATHETERIZATION WITH CORONARY ANGIOGRAM;  Surgeon: Sherren Mocha, MD;  Location: [facility] CATH LAB;  Service: Cardiovascular;  Laterality: N/A;  . multiple D&C    . TONSILLECTOMY  age 53    Family History  Problem Relation Age of Onset  . Cancer Brother        Colon  . Coronary artery disease Neg Hx   . Heart attack Neg Hx     Social History   Social History  . Marital status: Widowed    Spouse name: N/A  . Number of children: 3  . Years of education: 12   Occupational History  . CASHIER    Social History Main Topics  . Smoking status: Never Smoker  . Smokeless tobacco: Never Used  . Alcohol use No  . Drug use: No  . Sexual activity: No   Other Topics Concern  . Not on file   Social History Narrative   Widowed '13. 2 sons- '63, '66; one daughter-'69;    4 grandchildren--has raised 9 of her grandchildren 4 great grandchildren.   Lives with 2 granddaughters and adoptive son      Has living will   Probably would have son Dellia Donnelly as health care POA--not set up   Would accept resuscitation but no prolonged ventilation   Not sure about tube feeds   Review of Systems Dentures upper and lower Appetite is okay Weight up slightly Sleeping okay Doesn't drive now-- wears seat belt Bowels are good now Voids fine--no incontinence No skin problems    Objective:   Physical Exam  Constitutional: She is oriented to person, place, and time. She appears well-developed and well-nourished. No distress.  HENT:  Mouth/Throat: Oropharynx is clear and moist. No oropharyngeal exudate.  Eyes:  Dense cataract on left  Neck: No thyromegaly present.  Cardiovascular: Normal rate, regular rhythm and normal heart sounds.  Exam reveals no gallop.   No murmur heard. Faint pedal pulses  Pulmonary/Chest: Effort normal and breath sounds normal. No respiratory distress. She has no wheezes. She has no rales.  Abdominal: Soft. There is no tenderness.    Musculoskeletal: She exhibits no edema or tenderness.  Lymphadenopathy:    She has no cervical adenopathy.  Neurological: She is alert and oriented to person, place, and time.  President-- "Dwaine Deter, ?" [phone] [id number]---"that's not right"  D-[id number] Recall 3/3  Decreased sensation in feet  Skin: No rash noted.  No foot lesions  Psychiatric: She has a normal mood and affect. Her behavior is normal.          Assessment & Plan:

## 2016-10-29 NOTE — Assessment & Plan Note (Signed)
No recent angina Has the nitroglycerin

## 2016-10-29 NOTE — Assessment & Plan Note (Signed)
Will continue to monitor Recent UDS

## 2016-10-29 NOTE — Assessment & Plan Note (Signed)
Likely cataract Nothing to suggest intracranial process---will forgo imaging Recent carotids okay Will set up with optho

## 2016-10-29 NOTE — Assessment & Plan Note (Signed)
No evidence of recurrence Vision loss not indicative of this recurring

## 2016-10-29 NOTE — Assessment & Plan Note (Signed)
Chronic mood issues On the amitriptyline

## 2016-10-29 NOTE — Assessment & Plan Note (Signed)
Reviewed CSRS--nothing but mine Will continue ongoing narcotic regimen

## 2016-10-29 NOTE — Assessment & Plan Note (Addendum)
Poor control May need to consider another med if still over 9% Will restart insulin (basaglar) as needed

## 2016-10-30 DIAGNOSIS — H2589 Other age-related cataract: Secondary | ICD-10-CM | POA: Diagnosis not present

## 2016-10-30 LAB — RENAL FUNCTION PANEL
Albumin: 4.6 g/dL (ref 3.5–5.2)
BUN: 18 mg/dL (ref 6–23)
CO2: 26 mEq/L (ref 19–32)
Calcium: 9.8 mg/dL (ref 8.4–10.5)
Chloride: 96 mEq/L (ref 96–112)
Creatinine, Ser: 0.96 mg/dL (ref 0.40–1.20)
GFR: 60.47 mL/min (ref >60.00)
Glucose, Bld: 197 mg/dL — ABNORMAL HIGH (ref 70–99)
Phosphorus: 3.2 mg/dL (ref 2.3–4.6)
Potassium: 4.6 mEq/L (ref 3.5–5.1)
Sodium: 133 mEq/L — ABNORMAL LOW (ref 135–145)

## 2016-10-30 LAB — LIPID PANEL
Cholesterol: 263 mg/dL — ABNORMAL HIGH (ref 0–200)
HDL: 33.6 mg/dL — ABNORMAL LOW (ref >39.00)
Total CHOL/HDL Ratio: 8
Triglycerides: 1163 mg/dL — ABNORMAL HIGH (ref 0.0–149.0)

## 2016-10-30 LAB — HM DIABETES EYE EXAM

## 2016-10-30 LAB — LDL CHOLESTEROL, DIRECT: Direct LDL: 69 mg/dL

## 2016-10-30 LAB — HEMOGLOBIN A1C: Hgb A1c MFr Bld: 10 % — ABNORMAL HIGH (ref 4.6–6.5)

## 2016-11-02 ENCOUNTER — Encounter: Payer: Self-pay | Admitting: *Deleted

## 2016-11-03 ENCOUNTER — Encounter: Payer: Self-pay | Admitting: Internal Medicine

## 2016-11-05 ENCOUNTER — Other Ambulatory Visit: Payer: Self-pay

## 2016-11-05 MED ORDER — PEN NEEDLES 32G X 5 MM MISC: 1.0000 [IU] | Freq: Every day | 3 refills | Status: DC

## 2016-11-05 MED ORDER — INSULIN GLARGINE 100 UNIT/ML SOLOSTAR PEN: PEN_INJECTOR | SUBCUTANEOUS | 0 refills | Status: DC

## 2016-11-05 NOTE — Telephone Encounter (Signed)
Per lab results, new rx built to be sent to [facility]. For some reason it keeps printing the rx. Waiting for Dr Silvio Pate to sign so I can fax it to [facility].

## 2016-11-06 ENCOUNTER — Telehealth: Payer: Self-pay

## 2016-11-06 DIAGNOSIS — H2589 Other age-related cataract: Secondary | ICD-10-CM | POA: Diagnosis not present

## 2016-11-06 NOTE — Telephone Encounter (Signed)
[facility] and the pt called to say she cannot take Metformin 2 days before her cataract surgery next week. She has not started the Lantus and would have to half the dose the night before and not take it the day of. I told her to not start the Lantus until after her surgery. Do you have any suggestions of what she can do to keep blood sugar down while off of the metformin for 2 days? She is aware it will be Monday before I call her back.

## 2016-11-07 NOTE — Telephone Encounter (Signed)
He only thing would be to start the lantus with just the half dose before the procedure. I am unsure of why she can't take the metformin but would defer that decision to the ophthamologist. Cataract procedures are very brief---she can take her metformin after it is done and she is ready to eat

## 2016-11-09 ENCOUNTER — Encounter: Payer: Self-pay | Admitting: *Deleted

## 2016-11-09 NOTE — Pre-Procedure Instructions (Signed)
11/06/16 NOTE ADDRESSES DR Irving Copas AWARE OF CATARACT SURGERY

## 2016-11-09 NOTE — Telephone Encounter (Signed)
Spoke to pt

## 2016-11-10 ENCOUNTER — Encounter
Admission: RE | Disposition: A | Discharge status: Home / Self Care | Payer: Self-pay | Source: Ambulatory Visit | Attending: Ophthalmology

## 2016-11-10 ENCOUNTER — Encounter: Payer: Self-pay | Admitting: *Deleted

## 2016-11-10 ENCOUNTER — Ambulatory Visit: Payer: PPO | Admitting: Anesthesiology

## 2016-11-10 ENCOUNTER — Ambulatory Visit
Admission: RE | Admit: 2016-11-10 | Discharge: 2016-11-10 | Disposition: A | Discharge status: Home / Self Care | Payer: PPO | Source: Ambulatory Visit | Attending: Ophthalmology | Admitting: Ophthalmology

## 2016-11-10 DIAGNOSIS — H2512 Age-related nuclear cataract, left eye: Secondary | ICD-10-CM | POA: Diagnosis not present

## 2016-11-10 DIAGNOSIS — I2510 Atherosclerotic heart disease of native coronary artery without angina pectoris: Secondary | ICD-10-CM | POA: Insufficient documentation

## 2016-11-10 DIAGNOSIS — E7800 Pure hypercholesterolemia, unspecified: Secondary | ICD-10-CM | POA: Insufficient documentation

## 2016-11-10 DIAGNOSIS — I252 Old myocardial infarction: Secondary | ICD-10-CM | POA: Diagnosis not present

## 2016-11-10 DIAGNOSIS — Z7984 Long term (current) use of oral hypoglycemic drugs: Secondary | ICD-10-CM | POA: Insufficient documentation

## 2016-11-10 DIAGNOSIS — E119 Type 2 diabetes mellitus without complications: Secondary | ICD-10-CM | POA: Diagnosis not present

## 2016-11-10 DIAGNOSIS — I10 Essential (primary) hypertension: Secondary | ICD-10-CM | POA: Diagnosis not present

## 2016-11-10 DIAGNOSIS — F419 Anxiety disorder, unspecified: Secondary | ICD-10-CM | POA: Insufficient documentation

## 2016-11-10 DIAGNOSIS — F329 Major depressive disorder, single episode, unspecified: Secondary | ICD-10-CM | POA: Insufficient documentation

## 2016-11-10 DIAGNOSIS — Z79899 Other long term (current) drug therapy: Secondary | ICD-10-CM | POA: Insufficient documentation

## 2016-11-10 DIAGNOSIS — H2589 Other age-related cataract: Secondary | ICD-10-CM | POA: Diagnosis not present

## 2016-11-10 HISTORY — DX: Concussion with loss of consciousness of unspecified duration, initial encounter: S06.0X9A

## 2016-11-10 HISTORY — DX: Depression, unspecified: F32.A

## 2016-11-10 HISTORY — DX: Acute myocardial infarction, unspecified: I21.9

## 2016-11-10 HISTORY — DX: Concussion with loss of consciousness status unknown, initial encounter: S06.0XAA

## 2016-11-10 HISTORY — PX: CATARACT EXTRACTION W/PHACO: SHX586

## 2016-11-10 HISTORY — DX: Major depressive disorder, single episode, unspecified: F32.9

## 2016-11-10 LAB — GLUCOSE, CAPILLARY: Glucose-Capillary: 255 mg/dL — ABNORMAL HIGH (ref 65–99)

## 2016-11-10 SURGERY — PHACOEMULSIFICATION, CATARACT, WITH IOL INSERTION
Anesthesia: Monitor Anesthesia Care | Site: Eye | Laterality: Left | Wound class: Clean

## 2016-11-10 MED ORDER — EPINEPHRINE PF 1 MG/ML IJ SOLN
INTRAMUSCULAR | Status: AC
  Filled 2016-11-10: qty 2

## 2016-11-10 MED ORDER — FENTANYL CITRATE (PF) 100 MCG/2ML IJ SOLN
INTRAMUSCULAR | Status: DC | PRN
  Administered 2016-11-10 (×2): 25 ug via INTRAVENOUS
  Administered 2016-11-10 (×2): 50 ug via INTRAVENOUS

## 2016-11-10 MED ORDER — TRYPAN BLUE 0.06 % OP SOLN
OPHTHALMIC | Status: AC
  Filled 2016-11-10: qty 0.5

## 2016-11-10 MED ORDER — ARMC OPHTHALMIC DILATING DROPS
OPHTHALMIC | Status: AC
  Filled 2016-11-10: qty 0.4

## 2016-11-10 MED ORDER — FENTANYL CITRATE (PF) 100 MCG/2ML IJ SOLN: 25.0000 ug | INTRAMUSCULAR | Status: DC | PRN

## 2016-11-10 MED ORDER — ONDANSETRON HCL 4 MG/2ML IJ SOLN: 4.0000 mg | Freq: Once | INTRAMUSCULAR | Status: DC | PRN

## 2016-11-10 MED ORDER — ONDANSETRON HCL 4 MG/2ML IJ SOLN
INTRAMUSCULAR | Status: DC | PRN
  Administered 2016-11-10: 4 mg via INTRAVENOUS

## 2016-11-10 MED ORDER — POVIDONE-IODINE 5 % OP SOLN
OPHTHALMIC | Status: AC
  Filled 2016-11-10: qty 30

## 2016-11-10 MED ORDER — EPINEPHRINE PF 1 MG/ML IJ SOLN
INTRAOCULAR | Status: DC | PRN
  Administered 2016-11-10: 1 mL via OPHTHALMIC

## 2016-11-10 MED ORDER — NA CHONDROIT SULF-NA HYALURON 40-17 MG/ML IO SOLN
INTRAOCULAR | Status: AC
  Filled 2016-11-10: qty 1

## 2016-11-10 MED ORDER — TRYPAN BLUE 0.06 % OP SOLN
OPHTHALMIC | Status: DC | PRN
  Administered 2016-11-10: .5 mL via INTRAOCULAR

## 2016-11-10 MED ORDER — FENTANYL CITRATE (PF) 100 MCG/2ML IJ SOLN
INTRAMUSCULAR | Status: AC
  Filled 2016-11-10: qty 2

## 2016-11-10 MED ORDER — MOXIFLOXACIN HCL 0.5 % OP SOLN
OPHTHALMIC | Status: AC
  Filled 2016-11-10: qty 3

## 2016-11-10 MED ORDER — MIDAZOLAM HCL 2 MG/2ML IJ SOLN
INTRAMUSCULAR | Status: DC | PRN
  Administered 2016-11-10: 1 mg via INTRAVENOUS

## 2016-11-10 MED ORDER — POVIDONE-IODINE 5 % OP SOLN
OPHTHALMIC | Status: DC | PRN
  Administered 2016-11-10: 1 via OPHTHALMIC

## 2016-11-10 MED ORDER — NA CHONDROIT SULF-NA HYALURON 40-17 MG/ML IO SOLN
INTRAOCULAR | Status: DC | PRN
  Administered 2016-11-10: 1 mL via INTRAOCULAR

## 2016-11-10 MED ORDER — MOXIFLOXACIN HCL 0.5 % OP SOLN: 1.0000 [drp] | OPHTHALMIC | Status: DC | PRN

## 2016-11-10 MED ORDER — MIDAZOLAM HCL 2 MG/2ML IJ SOLN
INTRAMUSCULAR | Status: AC
Start: 2016-11-10 — End: 2016-11-10
  Filled 2016-11-10: qty 2

## 2016-11-10 MED ORDER — ARMC OPHTHALMIC DILATING DROPS
1.0000 "application " | OPHTHALMIC | Status: AC
  Administered 2016-11-10: 1 via OPHTHALMIC

## 2016-11-10 MED ORDER — LIDOCAINE HCL (PF) 4 % IJ SOLN
INTRAMUSCULAR | Status: AC
  Filled 2016-11-10: qty 5

## 2016-11-10 MED ORDER — MOXIFLOXACIN HCL 0.5 % OP SOLN
OPHTHALMIC | Status: DC | PRN
  Administered 2016-11-10: .2 mL via OPHTHALMIC

## 2016-11-10 MED ORDER — CARBACHOL 0.01 % IO SOLN
INTRAOCULAR | Status: DC | PRN
  Administered 2016-11-10: .5 mL via INTRAOCULAR

## 2016-11-10 MED ORDER — SODIUM CHLORIDE 0.9 % IV SOLN
INTRAVENOUS | Status: DC
  Administered 2016-11-10: 08:00:00 via INTRAVENOUS

## 2016-11-10 MED ORDER — LIDOCAINE HCL (PF) 4 % IJ SOLN
INTRAMUSCULAR | Status: DC | PRN
  Administered 2016-11-10: 2 mL via OPHTHALMIC

## 2016-11-10 MED ORDER — DORZOLAMIDE HCL-TIMOLOL MAL 2-0.5 % OP SOLN
OPHTHALMIC | Status: AC
  Filled 2016-11-10: qty 10

## 2016-11-10 SURGICAL SUPPLY — 17 items
GLOVE BIO SURGEON STRL SZ8 (GLOVE) ×2 IMPLANT
GLOVE BIOGEL M 6.5 STRL (GLOVE) ×2 IMPLANT
GLOVE SURG LX 8.0 MICRO (GLOVE) ×1
GLOVE SURG LX STRL 8.0 MICRO (GLOVE) ×1 IMPLANT
GOWN STRL REUS W/ TWL LRG LVL3 (GOWN DISPOSABLE) ×2 IMPLANT
GOWN STRL REUS W/TWL LRG LVL3 (GOWN DISPOSABLE) ×4
LENS IOL ACRSF MP 23.0 (Intraocular Lens) ×1 IMPLANT
LENS IOL ACRYSOF POST 23.0 (Intraocular Lens) ×2 IMPLANT
LENS IOL TECNIS ITEC 24.0 (Intraocular Lens) IMPLANT
PACK CATARACT (MISCELLANEOUS) ×2 IMPLANT
PACK CATARACT BRASINGTON LX (MISCELLANEOUS) ×2 IMPLANT
SOL BSS BAG (MISCELLANEOUS) ×2
SOLUTION BSS BAG (MISCELLANEOUS) ×1 IMPLANT
SUT ETHILON 10 0 CS140 6 (SUTURE) ×2 IMPLANT
SYR 5ML LL (SYRINGE) ×2 IMPLANT
WATER STERILE IRR 250ML POUR (IV SOLUTION) ×2 IMPLANT
WIPE NON LINTING 3.25X3.25 (MISCELLANEOUS) ×2 IMPLANT

## 2016-11-10 NOTE — Transfer of Care (Signed)
Immediate Anesthesia Transfer of Care Note  Patient: [name]  Procedure(s) Performed: Procedure(s) with comments: CATARACT EXTRACTION PHACO AND INTRAOCULAR LENS PLACEMENT ([place]) suture placed in left eye at end of procedure (Left) - [place]  3.20 AP% 27.7 CDE 55.63 Fluid pack lot # [id number]  Patient Location: PACU  Anesthesia Type:MAC  Level of Consciousness: awake, alert  and oriented  Airway & Oxygen Therapy: Patient Spontanous Breathing  Post-op Assessment: Report given to RN  Post vital signs: Reviewed and stable  Last Vitals:  Vitals:   11/10/16 0700  BP: (!) 176/86  Pulse: 80  Resp: 18  Temp: 36.7 C    Last Pain:  Vitals:   11/10/16 0700  TempSrc: Oral  PainSc: 0-No pain         Complications: No apparent anesthesia complications

## 2016-11-10 NOTE — Anesthesia Procedure Notes (Signed)
Date/Time: 11/10/2016 8:09 AM Performed by: Nelda Marseille Pre-anesthesia Checklist: Patient identified, Emergency Drugs available, Suction available, Patient being monitored and Timeout performed Oxygen Delivery Method: Nasal cannula

## 2016-11-10 NOTE — Anesthesia Postprocedure Evaluation (Signed)
Anesthesia Post Note  Patient: Cynthia Singh  Procedure(s) Performed: Procedure(s) (LRB): CATARACT EXTRACTION PHACO AND INTRAOCULAR LENS PLACEMENT ([place]) suture placed in left eye at end of procedure (Left)  Patient location during evaluation: PACU Anesthesia Type: MAC Level of consciousness: awake Pain management: pain level controlled Vital Signs Assessment: post-procedure vital signs reviewed and stable Respiratory status: spontaneous breathing Cardiovascular status: stable Anesthetic complications: no     Last Vitals:  Vitals:   11/10/16 0700 11/10/16 0856  BP: (!) 176/86 (!) 156/87  Pulse: 80 66  Resp: 18 16  Temp: 36.7 C 36.7 C    Last Pain:  Vitals:   11/10/16 0856  TempSrc: Oral  PainSc:                  VAN STAVEREN,Nichole Keltner

## 2016-11-10 NOTE — Op Note (Signed)
PREOPERATIVE DIAGNOSIS:  Nuclear sclerotic cataract of the left eye.   POSTOPERATIVE DIAGNOSIS:  Nuclear sclerotic cataract of the left eye.   OPERATIVE PROCEDURE: Procedure(s): CATARACT EXTRACTION PHACO AND INTRAOCULAR LENS PLACEMENT (I[facility]) suture placed in left eye at end of procedure   SURGEON:  Birder Robson, MD.   ANESTHESIA:  Anesthesiologist: [facility], Jane Canary, MD  1.      Managed anesthesia care. 2.     0.10ml of Shugarcaine was instilled following the paracentesis   COMPLICATIONS:  an anterior capsule tear extended posteriorly. A small piece of nucleus was lost in the posterior sgement. A sulcus lens was placed and appeared stable. Remainig viscoelastic was removed with low flow settings. A small wisp of vitreous remained in the anterior chamber. A single 10-0 suture was placed in the main inscision.   TECHNIQUE:   Stop and chop   DESCRIPTION OF PROCEDURE:  The patient was examined and consented in the preoperative holding area where the aforementioned topical anesthesia was applied to the left eye and then brought back to the Operating Room where the left eye was prepped and draped in the usual sterile ophthalmic fashion and a lid speculum was placed. A paracentesis was created with the side port blade and the anterior chamber was filled with viscoelastic. A near clear corneal incision was performed with the steel keratome. A continuous curvilinear capsulorrhexis was performed with a cystotome followed by the capsulorrhexis forceps. Hydrodissection and hydrodelineation were carried out with BSS on a blunt cannula. The lens was removed in a stop and chop  technique and the remaining cortical material was removed with the irrigation-aspiration handpiece. The capsular bag was inflated with viscoelastic and the Technis [id number] lens was placed in the capsular bag without complication. The remaining viscoelastic was removed from the eye with the irrigation-aspiration handpiece. The  wounds were hydrated. The anterior chamber was flushed with Miostat and the eye was inflated to physiologic pressure. 0.54ml Vigamox was placed in the anterior chamber. The wounds were found to be water tight. The eye was dressed with Vigamox. The patient was given protective glasses to wear throughout the day and a shield with which to sleep tonight. The patient was also given drops with which to begin a drop regimen today and will follow-up with me in one day.  Implant Name Type Inv. Item Serial No. Manufacturer Lot No. LRB No. Used  LENS IOL DIOP 24.0 - [id number] Intraocular Lens LENS IOL DIOP 24.0 [id number]  Left 1  LENS IOL POST 23.0 - [id number] Intraocular Lens LENS IOL POST 23.0 [id number] ALCON  Left 1  LENS IOL POST 23.0 - [id number] Intraocular Lens LENS IOL POST 23.0 [id number] ALCON   Left 1    Procedure(s) with comments: CATARACT EXTRACTION PHACO AND INTRAOCULAR LENS PLACEMENT ([facility]) suture placed in left eye at end of procedure (Left) - [place]  3.20 AP% 27.7 CDE 55.63 Fluid pack lot # [id number]  Electronically signed: [place] 11/10/2016 8:52 AM

## 2016-11-10 NOTE — H&P (Signed)
All labs reviewed. Abnormal studies sent to patients PCP when indicated.  Previous H&P reviewed, patient examined, there are NO CHANGES.  Junaid Wurzer LOUIS6/26/20187:47 AM

## 2016-11-10 NOTE — Anesthesia Post-op Follow-up Note (Cosign Needed)
Anesthesia QCDR form completed.        

## 2016-11-10 NOTE — Discharge Instructions (Signed)
Eye Surgery Discharge Instructions  Expect mild scratchy sensation or mild soreness. DO NOT RUB YOUR EYE!  The day of surgery:  Minimal physical activity, but bed rest is not required  No reading, computer work, or close hand work  No bending, lifting, or straining.  May watch TV  For 24 hours:  No driving, legal decisions, or alcoholic beverages  Safety precautions  Eat anything you prefer: It is better to start with liquids, then soup then solid foods.  _____ Eye patch should be worn until postoperative exam tomorrow.  ____ Solar shield eyeglasses should be worn for comfort in the sunlight/patch while sleeping  Resume all regular medications including aspirin or Coumadin if these were discontinued prior to surgery. You may shower, bathe, shave, or wash your hair. Tylenol may be taken for mild discomfort.  Call your doctor if you experience significant pain, nausea, or vomiting, fever > 101 or other signs of infection. [phone] or [phone] Specific instructions:  Follow-up Information    Birder Robson, MD Follow up on 11/11/2016.   Specialty:  Ophthalmology Why:  9:50 Contact information: [address] [place] [address] [phone]

## 2016-11-10 NOTE — Anesthesia Preprocedure Evaluation (Signed)
Anesthesia Evaluation  Patient identified by MRN, date of birth, ID band Patient awake    Reviewed: Allergy & Precautions, NPO status , Patient's Chart, lab work & pertinent test results  Airway Mallampati: II       Dental  (+) Upper Dentures, Lower Dentures   Pulmonary neg pulmonary ROS,    breath sounds clear to auscultation       Cardiovascular hypertension, Pt. on home beta blockers + angina with exertion + CAD and + Past MI   Rhythm:Regular     Neuro/Psych Anxiety    GI/Hepatic negative GI ROS, Neg liver ROS,   Endo/Other  diabetes, Type 2, Oral Hypoglycemic Agents  Renal/GU negative Renal ROS     Musculoskeletal   Abdominal Normal abdominal exam  (+)   Peds negative pediatric ROS (+)  Hematology   Anesthesia Other Findings   Reproductive/Obstetrics                             Anesthesia Physical Anesthesia Plan  ASA: III  Anesthesia Plan: MAC   Post-op Pain Management:    Induction: Intravenous  PONV Risk Score and Plan: Ondansetron  Airway Management Planned:   Additional Equipment:   Intra-op Plan:   Post-operative Plan:   Informed Consent: I have reviewed the patients History and Physical, chart, labs and discussed the procedure including the risks, benefits and alternatives for the proposed anesthesia with the patient or authorized representative who has indicated his/her understanding and acceptance.     Plan Discussed with: CRNA  Anesthesia Plan Comments:         Anesthesia Quick Evaluation

## 2016-11-11 ENCOUNTER — Encounter: Payer: Self-pay | Admitting: Ophthalmology

## 2016-11-11 ENCOUNTER — Telehealth: Payer: Self-pay

## 2016-11-11 NOTE — Telephone Encounter (Signed)
Spoke to pt. She did buy the Lantus. She will let us know when she is almost out so we can send her Novolin in.

## 2016-11-11 NOTE — Telephone Encounter (Signed)
I am okay with changing her over to humulin N at the same dosage--but it should be taken in the morning. If her morning sugars are high, will need to give evening dose also (will split dose) Have her give [place] an update about how she is doing after 2-3 weeks

## 2016-11-11 NOTE — Telephone Encounter (Signed)
Amy at [facility] left v/m; pt picked up her lantus today but pt advised pharmacist that she will not be able to afford to get lantus any more. Pt copay is $90.00. Amy said that pt could get lantus thru pt assistance program for medicare pt; fill out form which will be denied and then an appeal has to be done and then pt will get lantus free thru end of yr. 2nd option change pt back to humulin N which would be much more affordable. Amy request cb.

## 2016-11-25 ENCOUNTER — Other Ambulatory Visit: Payer: Self-pay | Admitting: Cardiovascular Disease

## 2016-11-25 ENCOUNTER — Other Ambulatory Visit: Payer: Self-pay

## 2016-11-25 MED ORDER — OXYCODONE-ACETAMINOPHEN 10-325 MG PO TABS: 1.0000 | ORAL_TABLET | Freq: Four times a day (QID) | ORAL | 0 refills | Status: DC | PRN

## 2016-11-25 NOTE — Telephone Encounter (Signed)
Pt left v/m requesting oxycodone apap. Call when ready for pick up. pts pocketbook missing and request refill of med. Pt last annual exam and rx printed # 180 on 10/29/16.

## 2016-11-25 NOTE — Telephone Encounter (Signed)
She is close enough that I could refill it today--but I cannot replace it if she gets it stolen

## 2016-11-25 NOTE — Telephone Encounter (Signed)
Tried to call pt. VM not set up. Rx up front. Insurance may not allow this early refill, but Dr Silvio Pate said it is close enough to be filled.

## 2016-12-18 ENCOUNTER — Other Ambulatory Visit: Payer: Self-pay | Admitting: Internal Medicine

## 2016-12-22 ENCOUNTER — Other Ambulatory Visit: Payer: Self-pay | Admitting: Internal Medicine

## 2016-12-25 ENCOUNTER — Other Ambulatory Visit: Payer: Self-pay

## 2016-12-25 MED ORDER — OXYCODONE-ACETAMINOPHEN 10-325 MG PO TABS: 1.0000 | ORAL_TABLET | Freq: Four times a day (QID) | ORAL | 0 refills | Status: DC | PRN

## 2016-12-25 NOTE — Telephone Encounter (Signed)
Pt left v/m requesting rx for Oxycodone apap . Call when ready for pick up. Last printed # 180 on 11/25/16. Last seen 10/29/16.

## 2016-12-25 NOTE — Telephone Encounter (Signed)
Tried to call pt. Someone picked up but there was silence then they hung up. Rx up front ready for pickup

## 2017-01-25 ENCOUNTER — Other Ambulatory Visit: Payer: Self-pay | Admitting: *Deleted

## 2017-01-25 MED ORDER — OXYCODONE-ACETAMINOPHEN 10-325 MG PO TABS: 1.0000 | ORAL_TABLET | Freq: Four times a day (QID) | ORAL | 0 refills | Status: DC | PRN

## 2017-01-25 NOTE — Telephone Encounter (Signed)
Patient left a voicemail requesting a refill on Percocet Last refill 12/25/16 #180 Last office visit 10/29/16

## 2017-01-25 NOTE — Telephone Encounter (Signed)
Spoke to pt and informed her Rx is available for pickup from the front desk 

## 2017-01-28 ENCOUNTER — Ambulatory Visit: Payer: PPO | Admitting: Internal Medicine

## 2017-02-11 ENCOUNTER — Ambulatory Visit: Payer: PPO | Admitting: Internal Medicine

## 2017-02-11 DIAGNOSIS — Z0289 Encounter for other administrative examinations: Secondary | ICD-10-CM

## 2017-02-22 ENCOUNTER — Other Ambulatory Visit: Payer: Self-pay

## 2017-02-22 MED ORDER — OXYCODONE-ACETAMINOPHEN 10-325 MG PO TABS: 1.0000 | ORAL_TABLET | Freq: Four times a day (QID) | ORAL | 0 refills | Status: DC | PRN

## 2017-02-22 NOTE — Telephone Encounter (Signed)
Pt left vm requesting rx oxycodone apap. Call when ready for pick up. Pt last seen 10/29/16. rx last printed # 180 on 01/25/17. Last UDS 09/01/16.

## 2017-02-22 NOTE — Telephone Encounter (Signed)
I will hold her rx and give it to her tomorrow

## 2017-02-22 NOTE — Telephone Encounter (Signed)
Has appt. tomorrow

## 2017-02-23 ENCOUNTER — Ambulatory Visit (INDEPENDENT_AMBULATORY_CARE_PROVIDER_SITE_OTHER): Payer: PPO | Admitting: Internal Medicine

## 2017-02-23 ENCOUNTER — Encounter: Payer: Self-pay | Admitting: Internal Medicine

## 2017-02-23 VITALS — BP 138/90 | HR 96 | Wt 189.0 lb

## 2017-02-23 DIAGNOSIS — M5441 Lumbago with sciatica, right side: Secondary | ICD-10-CM

## 2017-02-23 DIAGNOSIS — F1120 Opioid dependence, uncomplicated: Secondary | ICD-10-CM | POA: Diagnosis not present

## 2017-02-23 DIAGNOSIS — M5442 Lumbago with sciatica, left side: Secondary | ICD-10-CM

## 2017-02-23 DIAGNOSIS — G8929 Other chronic pain: Secondary | ICD-10-CM | POA: Diagnosis not present

## 2017-02-23 DIAGNOSIS — Z23 Encounter for immunization: Secondary | ICD-10-CM | POA: Diagnosis not present

## 2017-02-23 MED ORDER — NITROGLYCERIN 0.4 MG SL SUBL: 0.4000 mg | SUBLINGUAL_TABLET | SUBLINGUAL | 0 refills | Status: DC | PRN

## 2017-02-23 MED ORDER — NORTRIPTYLINE HCL 50 MG PO CAPS: 100.0000 mg | ORAL_CAPSULE | Freq: Every day | ORAL | 3 refills | Status: DC

## 2017-02-23 NOTE — Assessment & Plan Note (Signed)
Reviewed CSRS Stable dose Doing okay

## 2017-02-23 NOTE — Assessment & Plan Note (Signed)
Reviewed regimen Has been on amitriptyline for ?30 years Mainly to help sleep and for pain Discussed concerns--will try nortriptyline instead

## 2017-02-23 NOTE — Addendum Note (Signed)
Addended by: Pilar Grammes on: 02/23/2017 12:04 PM   Modules accepted: Orders

## 2017-02-23 NOTE — Progress Notes (Signed)
Subjective:    Patient ID: Cynthia Singh, female    DOB: 1940-05-20, 76 y.o.   MRN: [id number]  HPI Here for review chronic pain and narcotic dependence  Ongoing back pain Also some in legs (knees) and hands Uses the oxycodone three times a day Needs 2 at a time---1 hasn't helped for some time  Sleeps okay Amitriptyline helps this as well A little tired upon first arising--but gets going fairly quickly  Current Outpatient Prescriptions on File Prior to Visit  Medication Sig Dispense Refill  . amitriptyline (ELAVIL) 150 MG tablet TAKE ONE TABLET BY MOUTH EVERY NIGHT AT BEDTIME 90 tablet 3  . aspirin EC 81 MG tablet Take 81 mg by mouth daily.    Marland Kitchen atorvastatin (LIPITOR) 80 MG tablet Take 0.5 tablets (40 mg total) by mouth daily. 90 tablet 3  . carvedilol (COREG) 12.5 MG tablet Take 1 tablet (12.5 mg total) by mouth 2 (two) times daily with a meal. 60 tablet 5  . clopidogrel (PLAVIX) 75 MG tablet TAKE 1 TABLET BY MOUTH DAILY 40 tablet 0  . glipiZIDE (GLUCOTROL) 5 MG tablet Take 1 tablet (5 mg total) by mouth 2 (two) times daily before a meal. 60 tablet 0  . Insulin Glargine (LANTUS SOLOSTAR) 100 UNIT/ML Solostar Pen Start with 10 units a day. After 1 week, if fasting sugars still over 150 fasting, increase to 15 units and then 20 units after 2 weeks (if still up). 5 pen 0  . Insulin Pen Needle (PEN NEEDLES) 32G X 5 MM MISC 1 Units by Does not apply route daily. 100 each 3  . levETIRAcetam (KEPPRA) 500 MG tablet Take 1 tablet (500 mg total) by mouth 2 (two) times daily. 60 tablet 0  . losartan-hydrochlorothiazide (HYZAAR) 100-12.5 MG tablet TAKE 1 TABLET BY MOUTH DAILY 90 tablet 3  . nitroGLYCERIN (NITROSTAT) 0.4 MG SL tablet Place 1 tablet (0.4 mg total) under the tongue every 5 (five) minutes as needed for chest pain. 30 tablet 12  . Omega-3 Fatty Acids (FISH OIL PO) Take 1 capsule by mouth daily.    Marland Kitchen oxyCODONE-acetaminophen (PERCOCET) 10-325 MG tablet Take 1-2 tablets by mouth  every 6 (six) hours as needed for pain. 180 tablet 0  . ranitidine (ZANTAC) 150 MG tablet Take 150 mg by mouth daily as needed for heartburn.     No current facility-administered medications on file prior to visit.     No Known Allergies  Past Medical History:  Diagnosis Date  . CAD (coronary artery disease)    a. Remote nonobstructive disease but in 10/2012 Cath/PCI: s/p DES to RCA. b. cath 02/08/15 DES to prox LAD and balloon angioplasty of ost D1, EF 55-65%.  . Concussion    [place] AFTER FALL 3/18  . Depression   . Diabetes mellitus, type 2 ([place])   . Dyslipidemia   . Episodic mood disorder ([place])   . Hypertension   . Myocardial infarction ([place])    2014  . Obesity   . Osteoarthritis of spine    knees also    Past Surgical History:  Procedure Laterality Date  . ABDOMINAL HYSTERECTOMY    . APPENDECTOMY  1959  . CARDIAC CATHETERIZATION N/A 02/08/2015   Procedure: Left Heart Cath and Coronary Angiography;  Surgeon: Sherren Mocha, MD;  Location: [place] [facility];  Service: Cardiovascular;  Laterality: N/A;  . CARDIAC CATHETERIZATION N/A 02/08/2015   Procedure: Coronary Stent Intervention;  Surgeon: Sherren Mocha, MD;  Location: [place] [facility];  Service: Cardiovascular;  Laterality: N/A;  . CARDIAC CATHETERIZATION N/A 02/08/2015   Procedure: Intravascular Pressure Wire/FFR Study;  Surgeon: Sherren Mocha, MD;  Location: [place] [facility];  Service: Cardiovascular;  Laterality: N/A;  . CATARACT EXTRACTION W/PHACO Left 11/10/2016   Procedure: CATARACT EXTRACTION PHACO AND INTRAOCULAR LENS PLACEMENT ([place]) suture placed in left eye at end of procedure;  Surgeon: Birder Robson, MD;  Location: [facility];  Service: Ophthalmology;  Laterality: Left;  [place]  3.20 AP% 27.7 CDE 55.63 Fluid pack lot # [id number]  . CHOLECYSTECTOMY    . COMBINED HYSTERECTOMY ABDOMINAL W/ A&P REPAIR / OOPHORECTOMY  1968  . CORONARY ANGIOPLASTY     STENTS X 5  . CORONARY STENT PLACEMENT  2014  . DILATION AND  CURETTAGE OF UTERUS    . [place]  . LEFT HEART CATHETERIZATION WITH CORONARY ANGIOGRAM N/A 10/20/2012   Procedure: LEFT HEART CATHETERIZATION WITH CORONARY ANGIOGRAM;  Surgeon: Sherren Mocha, MD;  Location: [facility] CATH LAB;  Service: Cardiovascular;  Laterality: N/A;  . multiple D&C    . TONSILLECTOMY  age 48    Family History  Problem Relation Age of Onset  . Cancer Brother        [place]  . Coronary artery disease Neg Hx   . Heart attack Neg Hx     Social History   Social History  . Marital status: Widowed    Spouse name: N/A  . Number of children: 3  . Years of education: 12   Occupational History  . CASHIER    Social History Main Topics  . Smoking status: Never Smoker  . Smokeless tobacco: Never Used  . Alcohol use No  . Drug use: No  . Sexual activity: No   Other Topics Concern  . Not on file   Social History Narrative   Widowed '13. 2 sons- '63, '66; one daughter-'69;    71 grandchildren--has raised 9 of her grandchildren 4 great grandchildren.   Lives with 2 granddaughters and adoptive son      Has living will   Probably would have son Saryna Kneeland as health care POA--not set up   Would accept resuscitation but no prolonged ventilation   Not sure about tube feeds   Review of Systems Still on the lantus--- will plan to change to novolin N if she can't afford Appetite is fine Weight stable    Objective:   Physical Exam  Constitutional: She appears well-nourished. No distress.  Psychiatric: She has a normal mood and affect. Her behavior is normal.          Assessment & Plan:

## 2017-03-08 ENCOUNTER — Other Ambulatory Visit: Payer: Self-pay | Admitting: Internal Medicine

## 2017-03-08 ENCOUNTER — Other Ambulatory Visit: Payer: Self-pay | Admitting: Cardiovascular Disease

## 2017-03-08 NOTE — Telephone Encounter (Signed)
REFILL 

## 2017-03-25 ENCOUNTER — Other Ambulatory Visit: Payer: Self-pay | Admitting: Internal Medicine

## 2017-03-25 MED ORDER — OXYCODONE-ACETAMINOPHEN 10-325 MG PO TABS: 1.0000 | ORAL_TABLET | Freq: Four times a day (QID) | ORAL | 0 refills | Status: DC | PRN

## 2017-03-25 NOTE — Telephone Encounter (Signed)
Rx up front ready for pickup. Having telephone difficulties right now and unable to make outbound call.

## 2017-03-25 NOTE — Telephone Encounter (Signed)
Copied from [place] #[id number]. Topic: Quick Communication - See Telephone Encounter >> Mar 25, 2017  9:02 AM Bea Graff, NT wrote: CRM for notification. See Telephone encounter for: Patient calling to have her percocet 10mg  refilled. She uses [facility] on [address].   03/25/17.

## 2017-03-25 NOTE — Telephone Encounter (Signed)
Last OV and Rx 02/23/2017.

## 2017-03-25 NOTE — Telephone Encounter (Signed)
Spoke to pt. Rx up front ready for pickup 

## 2017-04-23 ENCOUNTER — Other Ambulatory Visit: Payer: Self-pay | Admitting: Internal Medicine

## 2017-04-23 ENCOUNTER — Other Ambulatory Visit: Payer: Self-pay | Admitting: Family Medicine

## 2017-04-23 MED ORDER — OXYCODONE-ACETAMINOPHEN 10-325 MG PO TABS: 1.0000 | ORAL_TABLET | Freq: Four times a day (QID) | ORAL | 0 refills | Status: DC | PRN

## 2017-04-23 NOTE — Telephone Encounter (Signed)
Dr Silvio Pate is out of the office this afternoon; Rx last printed #180 on 03/25/17; last seen 02/23/17 and last UDS 09/01/16.Dr Damita Dunnings wrote oxycodone apap 10/325 mg. Pt notified rx ready for pick up at front desk.

## 2017-04-23 NOTE — Progress Notes (Signed)
Pt notified rx at front desk for pick up. See phone note from [facility].

## 2017-04-23 NOTE — Progress Notes (Signed)
rx done, printed, signed. Thanks.

## 2017-04-23 NOTE — Telephone Encounter (Addendum)
Copied from [place] [id number]. Topic: Quick Communication - Rx Refill/Question >> Apr 23, 2017  9:08 AM Yvette Rack wrote: Has the patient contacted their pharmacy? No.   (Agent: If no, request that the patient contact the pharmacy for the refill.)  oxyCODONE-acetaminophen (PERCOCET) 10-325 MG tablet pt is completely out   Preferred Pharmacy (with phone number or street name): [facility] in [place]   Agent: Please be advised that RX refills may take up to 3 business days. We ask that you follow-up with your pharmacy.   >> Apr 23, 2017  9:23 AM Oneta Rack wrote: Relation to pt: self Call back number: [phone]  Pharmacy: [facility], [place], [place] [phone] (Phone) [phone] (Fax)  Reason for call:  Patient called back to confirm her oxyCODONE-acetaminophen (PERCOCET) 10-325 MG tablet refill request was sent to PCP, informed patient please allow 72 hour turnaround time, patient voice understanding but stated she's completely out and the office typically fills the same day.    Patient states PCP advised if nortriptyline (PAMELOR) 50 MG capsule 2x daily is not working, please inform PCP, medication is not working, patient seeking clinical advice

## 2017-04-23 NOTE — Telephone Encounter (Signed)
Copied from [place] [id number]. Topic: Quick Communication - Rx Refill/Question >> Apr 23, 2017  9:08 AM Yvette Rack wrote: Has the patient contacted their pharmacy? No.   (Agent: If no, request that the patient contact the pharmacy for the refill.)  oxyCODONE-acetaminophen (PERCOCET) 10-325 MG tablet pt is completely out   Preferred Pharmacy (with phone number or street name): [place]: Please be advised that RX refills may take up to 3 business days. We ask that you follow-up with your pharmacy.

## 2017-04-23 NOTE — Telephone Encounter (Signed)
Pt called requesting a refill on the Percocet. She stated that she is out and would like to get it as soon as possible.

## 2017-05-17 ENCOUNTER — Telehealth: Payer: Self-pay

## 2017-05-17 NOTE — Telephone Encounter (Signed)
Received a PA request for amitriptyline. Our system shows that she was off the amitriptyline and placed on nortriptyline. Landon checked with the other pharmacist and said the pt requested the amitriptyline because the nortriptyline was not working. She paid for the amitriptyline out of pocket.

## 2017-05-20 ENCOUNTER — Other Ambulatory Visit: Payer: Self-pay | Admitting: Internal Medicine

## 2017-05-20 NOTE — Telephone Encounter (Signed)
Copied from [place] [phone]. Topic: Quick Communication - See Telephone Encounter >> May 20, 2017  2:28 PM Bea Graff, NT wrote: CRM for notification. See Telephone encounter for: Pt calling needing a refill of percocet 10mg .  05/20/17.

## 2017-05-20 NOTE — Telephone Encounter (Signed)
Controlled substance 

## 2017-05-20 NOTE — Telephone Encounter (Signed)
Pt requesting rx for oxycodone apap. Call when ready for pick up. rx last printed # 180 on 04/23/17; last seen 02/23/17; last UDS 09/01/16.

## 2017-05-21 ENCOUNTER — Telehealth: Payer: Self-pay | Admitting: *Deleted

## 2017-05-21 MED ORDER — OXYCODONE-ACETAMINOPHEN 10-325 MG PO TABS: 1.0000 | ORAL_TABLET | Freq: Four times a day (QID) | ORAL | 0 refills | Status: DC | PRN

## 2017-05-21 NOTE — Telephone Encounter (Signed)
Printed.  Thanks.  

## 2017-05-21 NOTE — Telephone Encounter (Signed)
PA submitted thru CMM for Amitriptyline, awaiting response.

## 2017-05-21 NOTE — Telephone Encounter (Signed)
Patient advised.  Rx left at front desk for pick up. 

## 2017-05-27 ENCOUNTER — Ambulatory Visit: Payer: PPO | Admitting: Internal Medicine

## 2017-06-15 ENCOUNTER — Other Ambulatory Visit: Payer: Self-pay | Admitting: Cardiovascular Disease

## 2017-06-22 ENCOUNTER — Other Ambulatory Visit: Payer: Self-pay

## 2017-06-22 ENCOUNTER — Encounter: Payer: Self-pay | Admitting: Emergency Medicine

## 2017-06-22 ENCOUNTER — Emergency Department: Payer: PPO

## 2017-06-22 ENCOUNTER — Emergency Department
Admission: EM | Admit: 2017-06-22 | Discharge: 2017-06-22 | Disposition: A | Discharge status: Home / Self Care | Payer: PPO | Attending: Emergency Medicine | Admitting: Emergency Medicine

## 2017-06-22 DIAGNOSIS — E860 Dehydration: Secondary | ICD-10-CM

## 2017-06-22 DIAGNOSIS — E1140 Type 2 diabetes mellitus with diabetic neuropathy, unspecified: Secondary | ICD-10-CM | POA: Diagnosis not present

## 2017-06-22 DIAGNOSIS — I2510 Atherosclerotic heart disease of native coronary artery without angina pectoris: Secondary | ICD-10-CM | POA: Diagnosis not present

## 2017-06-22 DIAGNOSIS — S199XXA Unspecified injury of neck, initial encounter: Secondary | ICD-10-CM | POA: Diagnosis not present

## 2017-06-22 DIAGNOSIS — S0990XA Unspecified injury of head, initial encounter: Secondary | ICD-10-CM | POA: Diagnosis not present

## 2017-06-22 DIAGNOSIS — I10 Essential (primary) hypertension: Secondary | ICD-10-CM | POA: Insufficient documentation

## 2017-06-22 DIAGNOSIS — R531 Weakness: Secondary | ICD-10-CM | POA: Diagnosis not present

## 2017-06-22 DIAGNOSIS — M6281 Muscle weakness (generalized): Secondary | ICD-10-CM | POA: Diagnosis not present

## 2017-06-22 DIAGNOSIS — F1120 Opioid dependence, uncomplicated: Secondary | ICD-10-CM | POA: Diagnosis not present

## 2017-06-22 DIAGNOSIS — R404 Transient alteration of awareness: Secondary | ICD-10-CM | POA: Diagnosis not present

## 2017-06-22 DIAGNOSIS — E1165 Type 2 diabetes mellitus with hyperglycemia: Secondary | ICD-10-CM | POA: Diagnosis not present

## 2017-06-22 LAB — AMMONIA: Ammonia: 38 umol/L — ABNORMAL HIGH (ref 9–35)

## 2017-06-22 LAB — URINALYSIS, COMPLETE (UACMP) WITH MICROSCOPIC
Bacteria, UA: NONE SEEN
Bilirubin Urine: NEGATIVE
Glucose, UA: >=500 mg/dL — AB
Hgb urine dipstick: NEGATIVE
Ketones, ur: 5 mg/dL — AB
Leukocytes, UA: NEGATIVE
Nitrite: NEGATIVE
Protein, ur: NEGATIVE mg/dL
RBC / HPF: NONE SEEN RBC/hpf (ref 0–5)
Specific Gravity, Urine: 1.014 (ref 1.005–1.030)
pH: 5 (ref 5.0–8.0)

## 2017-06-22 LAB — URINE DRUG SCREEN, QUALITATIVE (ARMC ONLY)
Amphetamines, Ur Screen: NOT DETECTED
Barbiturates, Ur Screen: NOT DETECTED
Benzodiazepine, Ur Scrn: NOT DETECTED
Cannabinoid 50 Ng, Ur ~~LOC~~: NOT DETECTED
Cocaine Metabolite,Ur ~~LOC~~: NOT DETECTED
MDMA (Ecstasy)Ur Screen: NOT DETECTED
Methadone Scn, Ur: NOT DETECTED
Opiate, Ur Screen: POSITIVE — AB
Phencyclidine (PCP) Ur S: NOT DETECTED
Tricyclic, Ur Screen: POSITIVE — AB

## 2017-06-22 LAB — CBC WITH DIFFERENTIAL/PLATELET
Basophils Absolute: 0.1 10*3/uL (ref 0–0.1)
Basophils Relative: 1 %
Eosinophils Absolute: 0 10*3/uL (ref 0–0.7)
Eosinophils Relative: 0 %
HCT: 40.5 % (ref 35.0–47.0)
Hemoglobin: 13.8 g/dL (ref 12.0–16.0)
Lymphocytes Relative: 15 %
Lymphs Abs: 1.4 10*3/uL (ref 1.0–3.6)
MCH: 30.1 pg (ref 26.0–34.0)
MCHC: 34.1 g/dL (ref 32.0–36.0)
MCV: 88.3 fL (ref 80.0–100.0)
Monocytes Absolute: 0.4 10*3/uL (ref 0.2–0.9)
Monocytes Relative: 5 %
Neutro Abs: 7.4 10*3/uL — ABNORMAL HIGH (ref 1.4–6.5)
Neutrophils Relative %: 79 %
Platelets: 225 10*3/uL (ref 150–440)
RBC: 4.58 MIL/uL (ref 3.80–5.20)
RDW: 13.4 % (ref 11.5–14.5)
WBC: 9.3 10*3/uL (ref 3.6–11.0)

## 2017-06-22 LAB — COMPREHENSIVE METABOLIC PANEL
ALT: 35 U/L (ref 14–54)
AST: 47 U/L — ABNORMAL HIGH (ref 15–41)
Albumin: 4.2 g/dL (ref 3.5–5.0)
Alkaline Phosphatase: 76 U/L (ref 38–126)
Anion gap: 14 (ref 5–15)
BUN: 14 mg/dL (ref 6–20)
CO2: 22 mmol/L (ref 22–32)
Calcium: 9.1 mg/dL (ref 8.9–10.3)
Chloride: 99 mmol/L — ABNORMAL LOW (ref 101–111)
Creatinine, Ser: 0.83 mg/dL (ref 0.44–1.00)
GFR calc Af Amer: >60 mL/min (ref >60)
GFR calc non Af Amer: >60 mL/min (ref >60)
Glucose, Bld: 359 mg/dL — ABNORMAL HIGH (ref 65–99)
Potassium: 4.4 mmol/L (ref 3.5–5.1)
Sodium: 135 mmol/L (ref 135–145)
Total Bilirubin: 0.5 mg/dL (ref 0.3–1.2)
Total Protein: 7.5 g/dL (ref 6.5–8.1)

## 2017-06-22 LAB — TROPONIN I: Troponin I: <0.03 ng/mL (ref <0.03)

## 2017-06-22 LAB — GLUCOSE, CAPILLARY: Glucose-Capillary: 349 mg/dL — ABNORMAL HIGH (ref 65–99)

## 2017-06-22 LAB — BETA-HYDROXYBUTYRIC ACID: Beta-Hydroxybutyric Acid: 0.68 mmol/L — ABNORMAL HIGH (ref 0.05–0.27)

## 2017-06-22 MED ORDER — MORPHINE SULFATE (PF) 2 MG/ML IV SOLN
2.0000 mg | Freq: Once | INTRAVENOUS | Status: AC
  Administered 2017-06-22: 2 mg via INTRAVENOUS

## 2017-06-22 MED ORDER — SODIUM CHLORIDE 0.9 % IV SOLN: Freq: Once | INTRAVENOUS | Status: DC

## 2017-06-22 MED ORDER — NALOXONE HCL 2 MG/2ML IJ SOSY
0.4000 mg | PREFILLED_SYRINGE | Freq: Once | INTRAMUSCULAR | Status: AC
  Administered 2017-06-22: 0.4 mg via INTRAVENOUS
  Filled 2017-06-22: qty 2

## 2017-06-22 MED ORDER — SODIUM CHLORIDE 0.9 % IV SOLN
Freq: Once | INTRAVENOUS | Status: AC
  Administered 2017-06-22: 13:00:00 via INTRAVENOUS

## 2017-06-22 MED ORDER — MORPHINE SULFATE (PF) 4 MG/ML IV SOLN
4.0000 mg | Freq: Once | INTRAVENOUS | Status: AC
  Administered 2017-06-22: 4 mg via INTRAVENOUS
  Filled 2017-06-22: qty 1

## 2017-06-22 MED ORDER — MORPHINE SULFATE (PF) 2 MG/ML IV SOLN
INTRAVENOUS | Status: DC
Start: 2017-06-22 — End: 2017-06-22
  Filled 2017-06-22: qty 1

## 2017-06-22 MED ORDER — SODIUM CHLORIDE 0.9 % IV SOLN
Freq: Once | INTRAVENOUS | Status: AC
  Administered 2017-06-22: 12:00:00 via INTRAVENOUS

## 2017-06-22 NOTE — ED Notes (Signed)
In and out cath preformed by this RN and Lynelle Smoke, EMT-P

## 2017-06-22 NOTE — ED Provider Notes (Signed)
[facility] Emergency Department Provider Note       Time seen: ----------------------------------------- 11:24 AM on 06/22/2017 -----------------------------------------   I have reviewed the triage vital signs and the nursing notes.  HISTORY   Chief Complaint Hyperglycemia and Weakness    HPI Cynthia Singh is a 77 y.o. female with a history of diabetes, coronary artery disease, dyslipidemia, hypertension, MI who presents to the ED for weakness.  Patient reports running out of her insulin naproxen 5 days ago.  She fell this morning and has had multiple falls over the past several days.  She reports increased weakness, EMS reports she was orthostatic on arrival.  She is alert and oriented to person and place upon arrival.  Past Medical History:  Diagnosis Date  . CAD (coronary artery disease)    a. Remote nonobstructive disease but in 10/2012 Cath/PCI: s/p DES to RCA. b. cath 02/08/15 DES to prox LAD and balloon angioplasty of ost D1, EF 55-65%.  . Concussion    [place] AFTER FALL 3/18  . Depression   . Diabetes mellitus, type 2 ([place])   . Dyslipidemia   . Episodic mood disorder ([place])   . Hypertension   . Myocardial infarction ([place])    2014  . Obesity   . Osteoarthritis of spine    knees also    Patient Active Problem List   Diagnosis Date Noted  . Vision loss of left eye 10/29/2016  . Narcotic dependence ([place]) 10/29/2016  . Subdural bleeding ([place]) 07/19/2016  . Frequent falls 07/19/2016  . CAD S/P percutaneous coronary angioplasty/DES (RCA-2014/LAD-2016) 07/19/2016  . Preventative health care 04/30/2014  . Advanced directives, counseling/discussion 04/30/2014  . Atherosclerosis of native coronary artery with angina pectoris ([place]) 10/21/2012  . Obesity   . KNEE PAIN, LEFT, CHRONIC 02/25/2010  . Type 2 diabetes, uncontrolled, with neuropathy ([place]) 12/30/2009  . Hyperlipidemia 12/30/2009  . Episodic mood disorder ([place]) 12/30/2009  . Essential  hypertension 12/30/2009  . Chronic back pain 12/30/2009    Past Surgical History:  Procedure Laterality Date  . ABDOMINAL HYSTERECTOMY    . APPENDECTOMY  1959  . CARDIAC CATHETERIZATION N/A 02/08/2015   Procedure: Left Heart Cath and Coronary Angiography;  Surgeon: Sherren Mocha, MD;  Location: [place] CV LAB;  Service: Cardiovascular;  Laterality: N/A;  . CARDIAC CATHETERIZATION N/A 02/08/2015   Procedure: Coronary Stent Intervention;  Surgeon: Sherren Mocha, MD;  Location: [place] CV LAB;  Service: Cardiovascular;  Laterality: N/A;  . CARDIAC CATHETERIZATION N/A 02/08/2015   Procedure: Intravascular Pressure Wire/FFR Study;  Surgeon: Sherren Mocha, MD;  Location: [place] CV LAB;  Service: Cardiovascular;  Laterality: N/A;  . CATARACT EXTRACTION W/PHACO Left 11/10/2016   Procedure: CATARACT EXTRACTION PHACO AND INTRAOCULAR LENS PLACEMENT ([place]) suture placed in left eye at end of procedure;  Surgeon: Birder Robson, MD;  Location: [facility] ORS;  Service: Ophthalmology;  Laterality: Left;  [place]  3.20 AP% 27.7 CDE 55.63 Fluid pack lot # [id number]  . CHOLECYSTECTOMY    . COMBINED HYSTERECTOMY ABDOMINAL W/ A&P REPAIR / OOPHORECTOMY  1968  . CORONARY ANGIOPLASTY     STENTS X 5  . CORONARY STENT PLACEMENT  2014  . DILATION AND CURETTAGE OF UTERUS    . [place]  . LEFT HEART CATHETERIZATION WITH CORONARY ANGIOGRAM N/A 10/20/2012   Procedure: LEFT HEART CATHETERIZATION WITH CORONARY ANGIOGRAM;  Surgeon: Sherren Mocha, MD;  Location: [facility] CATH LAB;  Service: Cardiovascular;  Laterality: N/A;  . multiple D&C    .  TONSILLECTOMY  age 61    Allergies Patient has no known allergies.  Social History Social History   Tobacco Use  . Smoking status: Never Smoker  . Smokeless tobacco: Never Used  Substance Use Topics  . Alcohol use: No  . Drug use: No    Review of Systems Constitutional: Negative for fever. ENT: Positive for dry mouth Cardiovascular: Negative for  chest pain. Respiratory: Negative for shortness of breath. Gastrointestinal: Negative for abdominal pain, vomiting and diarrhea. Genitourinary: Negative for dysuria. Musculoskeletal: Negative for back pain. Skin: Negative for rash. Neurological: Negative for headaches, positive for generalized weakness  All systems negative/normal/unremarkable except as stated in the HPI  ____________________________________________   PHYSICAL EXAM:  VITAL SIGNS: ED Triage Vitals  Enc Vitals Group     BP      Pulse      Resp      Temp      Temp src      SpO2      Weight      Height      Head Circumference      Peak Flow      Pain Score      Pain Loc      Pain Edu?      Excl. in [place]?    Constitutional: Lethargic, no distress Eyes: Conjunctivae are normal. Normal extraocular movements. ENT   Head: Normocephalic and atraumatic.   Nose: No congestion/rhinnorhea.   Mouth/Throat: Mucous membranes are dry   Neck: No stridor. Cardiovascular: Normal rate, regular rhythm. No murmurs, rubs, or gallops. Respiratory: Normal respiratory effort without tachypnea nor retractions. Breath sounds are clear and equal bilaterally. No wheezes/rales/rhonchi. Gastrointestinal: Soft and nontender. Normal bowel sounds Musculoskeletal: Nontender with normal range of motion in extremities. No lower extremity tenderness nor edema. Neurologic:  Normal speech and language. No gross focal neurologic deficits are appreciated.  Her last weakness, nothing focal Skin:  Skin is warm, dry and intact. No rash noted. Psychiatric: Mood and affect are normal. Speech and behavior are normal.  ____________________________________________  EKG: Interpreted by me.  Sinus rhythm rate 86 bpm, LVH, likely inferior infarct age-indeterminate, leftward axis  ____________________________________________  ED COURSE:  As part of my medical decision making, I reviewed the following data within the electronic medical  record:  History obtained from family if available, nursing notes, old chart and ekg, as well as notes from prior ED visits. Patient presented for weakness and likely dehydration, we will assess with labs and imaging as indicated at this time.   Procedures ____________________________________________   LABS (pertinent positives/negatives)  Labs Reviewed  CBC WITH DIFFERENTIAL/PLATELET - Abnormal; Notable for the following components:      Result Value   Neutro Abs 7.4 (*)    All other components within normal limits  COMPREHENSIVE METABOLIC PANEL - Abnormal; Notable for the following components:   Chloride 99 (*)    Glucose, Bld 359 (*)    AST 47 (*)    All other components within normal limits  URINALYSIS, COMPLETE (UACMP) WITH MICROSCOPIC - Abnormal; Notable for the following components:   Color, Urine YELLOW (*)    APPearance CLEAR (*)    Glucose, UA >=500 (*)    Ketones, ur 5 (*)    Squamous Epithelial / LPF 0-5 (*)    All other components within normal limits  BETA-HYDROXYBUTYRIC ACID - Abnormal; Notable for the following components:   Beta-Hydroxybutyric Acid 0.68 (*)    All other components within normal limits  GLUCOSE,  CAPILLARY - Abnormal; Notable for the following components:   Glucose-Capillary 349 (*)    All other components within normal limits  URINE DRUG SCREEN, QUALITATIVE ([facility] ONLY) - Abnormal; Notable for the following components:   Tricyclic, Ur Screen POSITIVE (*)    Opiate, Ur Screen POSITIVE (*)    All other components within normal limits  AMMONIA - Abnormal; Notable for the following components:   Ammonia 38 (*)    All other components within normal limits  TROPONIN I  CBG MONITORING, ED  ____________________________________________  DIFFERENTIAL DIAGNOSIS   Dehydration, electrolyte abnormality, DKA, HH NK, renal failure, MI, CVA, medication side effect  FINAL ASSESSMENT AND PLAN  Weakness, hyperglycemia   Plan: Patient had presented for  weakness and recent inability to get her insulin filled. Patient's labs did not reveal any acute process other than hyperglycemia and mildly. Patient's imaging was negative for any acute process.  We gave her IV fluids and did give her 1 dose of Narcan.  Narcan has dramatically improved her somnolence and I feel that the patient is likely overmedicating or overmedicated.  Family is agreeable to this plan.  I advised she must diminish her narcotic intake.  Otherwise she is stable for outpatient follow-up.   Laurence Aly, MD   Note: This note was generated in part or whole with voice recognition software. Voice recognition is usually quite accurate but there are transcription errors that can and very often do occur. I apologize for any typographical errors that were not detected and corrected.     Earleen Newport, MD 06/22/17 [phone]

## 2017-06-22 NOTE — ED Notes (Signed)
Patient awake and tossing back in forth in bed yelling "take it out of me, I want it out". When questioned about statement, patient reports feeling "weird" and "like I'm dying". Patient reports pain and itching on face, neck, arms and legs. Patient reports "I need to talk to that doctor". Dr. Jimmye Norman aware and at bedside. Patient states "yall didn't tell me what you were going to do. You had no right." Patient daughter and this RN explaining to patient that medication had been discussed before being given however patient was not coherent enough to remember. MD explained to patient the action of narcan and that these were expected reactions. Patient verbalized understanding.

## 2017-06-22 NOTE — ED Notes (Signed)
Patient to lobby in wheelchair. Reports improvement of pain and "weird" feeling. Patient able to ambulate with standby assistance. Patient and family verbalized understanding of discharge instructions and follow-up care.

## 2017-06-22 NOTE — ED Triage Notes (Addendum)
Patient from home via [facility]. States she ran out of insulin approx 5 days ago. Patient reports falling this morning with multiple falls over the last several days. Patient reports increased weakness. EMS reports orthostatic hypotension upon their arrival. Patient oriented to person and place only upon arrival.

## 2017-06-23 ENCOUNTER — Encounter: Payer: Self-pay | Admitting: Internal Medicine

## 2017-06-23 ENCOUNTER — Ambulatory Visit: Payer: PPO | Admitting: Internal Medicine

## 2017-06-23 VITALS — BP 126/68 | HR 96 | Temp 98.1°F | Wt 197.0 lb

## 2017-06-23 DIAGNOSIS — M5442 Lumbago with sciatica, left side: Secondary | ICD-10-CM

## 2017-06-23 DIAGNOSIS — IMO0002 Reserved for concepts with insufficient information to code with codable children: Secondary | ICD-10-CM

## 2017-06-23 DIAGNOSIS — M5441 Lumbago with sciatica, right side: Secondary | ICD-10-CM

## 2017-06-23 DIAGNOSIS — E1140 Type 2 diabetes mellitus with diabetic neuropathy, unspecified: Secondary | ICD-10-CM | POA: Diagnosis not present

## 2017-06-23 DIAGNOSIS — G8929 Other chronic pain: Secondary | ICD-10-CM | POA: Diagnosis not present

## 2017-06-23 DIAGNOSIS — F1120 Opioid dependence, uncomplicated: Secondary | ICD-10-CM | POA: Diagnosis not present

## 2017-06-23 DIAGNOSIS — E1165 Type 2 diabetes mellitus with hyperglycemia: Secondary | ICD-10-CM | POA: Diagnosis not present

## 2017-06-23 LAB — HEMOGLOBIN A1C: Hgb A1c MFr Bld: 10.4 % — ABNORMAL HIGH (ref 4.6–6.5)

## 2017-06-23 MED ORDER — NORTRIPTYLINE HCL 50 MG PO CAPS: 150.0000 mg | ORAL_CAPSULE | Freq: Every day | ORAL | 11 refills | Status: DC

## 2017-06-23 MED ORDER — OXYCODONE-ACETAMINOPHEN 10-325 MG PO TABS: 1.0000 | ORAL_TABLET | Freq: Four times a day (QID) | ORAL | 0 refills | Status: DC | PRN

## 2017-06-23 MED ORDER — INSULIN GLARGINE 100 UNIT/ML SOLOSTAR PEN: PEN_INJECTOR | SUBCUTANEOUS | 11 refills | Status: DC

## 2017-06-23 NOTE — Assessment & Plan Note (Signed)
Ongoing narcotic need ?nortriptyline as adjunct (was on amitriptyline for sleep and depression as well)

## 2017-06-23 NOTE — Assessment & Plan Note (Signed)
CSRS shows no surprises Concern with ER visit--though likely combination  Will decrease the oxycodone to 150 per month Increase nortriptyline to the 150mg 

## 2017-06-23 NOTE — Progress Notes (Signed)
Subjective:    Patient ID: Cynthia Singh, female    DOB: [birth year], 77 y.o.   MRN: [id number]  HPI Here for follow up of chronic pain and narcotic dependence  Reviewed ER visit from yesterday She took old amitriptyline due to not having enough nortriptyline This caused sedation, etc Did get narcan and that improved her status  Reviewed her chronic pain Discussed cutting back on medication--she is willing to try  Sugars have not been good Over 200 often Using 22 units insulin once a day  Current Outpatient Medications on File Prior to Visit  Medication Sig Dispense Refill  . aspirin EC 81 MG tablet Take 81 mg by mouth daily.    Marland Kitchen atorvastatin (LIPITOR) 80 MG tablet Take 0.5 tablets (40 mg total) by mouth daily. 90 tablet 3  . carvedilol (COREG) 12.5 MG tablet Take 1 tablet (12.5 mg total) by mouth 2 (two) times daily with a meal. 60 tablet 5  . clopidogrel (PLAVIX) 75 MG tablet TAKE 1 TABLET BY MOUTH DAILY 15 tablet 0  . glipiZIDE (GLUCOTROL) 5 MG tablet TAKE 1 TABLET BY MOUTH TWICE A DAY BEFORE A MEAL 180 tablet 1  . Insulin Glargine (LANTUS SOLOSTAR) 100 UNIT/ML Solostar Pen Start with 10 units a day. After 1 week, if fasting sugars still over 150 fasting, increase to 15 units and then 20 units after 2 weeks (if still up). 5 pen 0  . Insulin Pen Needle (PEN NEEDLES) 32G X 5 MM MISC 1 Units by Does not apply route daily. 100 each 3  . levETIRAcetam (KEPPRA) 500 MG tablet TAKE 1 TABLET BY MOUTH TWICE A DAY 60 tablet 1  . losartan-hydrochlorothiazide (HYZAAR) 100-12.5 MG tablet TAKE 1 TABLET BY MOUTH DAILY 90 tablet 3  . nitroGLYCERIN (NITROSTAT) 0.4 MG SL tablet Place 1 tablet (0.4 mg total) under the tongue every 5 (five) minutes as needed for chest pain. 30 tablet 0  . Omega-3 Fatty Acids (FISH OIL PO) Take 1 capsule by mouth daily.    Marland Kitchen oxyCODONE-acetaminophen (PERCOCET) 10-325 MG tablet Take 1-2 tablets by mouth every 6 (six) hours as needed for pain. 180 tablet 0  .  ranitidine (ZANTAC) 150 MG tablet Take 150 mg by mouth daily as needed for heartburn.    . nortriptyline (PAMELOR) 50 MG capsule Take 2 capsules (100 mg total) by mouth at bedtime. (Patient not taking: Reported on 06/23/2017) 60 capsule 3   No current facility-administered medications on file prior to visit.     No Known Allergies  Past Medical History:  Diagnosis Date  . CAD (coronary artery disease)    a. Remote nonobstructive disease but in 10/2012 Cath/PCI: s/p DES to RCA. b. cath 02/08/15 DES to prox LAD and balloon angioplasty of ost D1, EF 55-65%.  . Concussion    [place] AFTER FALL 3/18  . Depression   . Diabetes mellitus, type 2 ([place])   . Dyslipidemia   . Episodic mood disorder ([place])   . Hypertension   . Myocardial infarction ([place])    2014  . Obesity   . Osteoarthritis of spine    knees also    Past Surgical History:  Procedure Laterality Date  . ABDOMINAL HYSTERECTOMY    . APPENDECTOMY  1959  . CARDIAC CATHETERIZATION N/A 02/08/2015   Procedure: Left Heart Cath and Coronary Angiography;  Surgeon: Sherren Mocha, MD;  Location: [place] CV LAB;  Service: Cardiovascular;  Laterality: N/A;  . CARDIAC CATHETERIZATION N/A 02/08/2015   Procedure: Coronary  Stent Intervention;  Surgeon: Sherren Mocha, MD;  Location: [place] CV LAB;  Service: Cardiovascular;  Laterality: N/A;  . CARDIAC CATHETERIZATION N/A 02/08/2015   Procedure: Intravascular Pressure Wire/FFR Study;  Surgeon: Sherren Mocha, MD;  Location: [place] CV LAB;  Service: Cardiovascular;  Laterality: N/A;  . CATARACT EXTRACTION W/PHACO Left 11/10/2016   Procedure: CATARACT EXTRACTION PHACO AND INTRAOCULAR LENS PLACEMENT ([place]) suture placed in left eye at end of procedure;  Surgeon: Birder Robson, MD;  Location: [facility] ORS;  Service: Ophthalmology;  Laterality: Left;  [place]  3.20 AP% 27.7 CDE 55.63 Fluid pack lot # [id number]  . CHOLECYSTECTOMY    . COMBINED HYSTERECTOMY ABDOMINAL W/ A&P REPAIR / OOPHORECTOMY  1968    . CORONARY ANGIOPLASTY     STENTS X 5  . CORONARY STENT PLACEMENT  2014  . DILATION AND CURETTAGE OF UTERUS    . [place]  . LEFT HEART CATHETERIZATION WITH CORONARY ANGIOGRAM N/A 10/20/2012   Procedure: LEFT HEART CATHETERIZATION WITH CORONARY ANGIOGRAM;  Surgeon: Sherren Mocha, MD;  Location: [facility] CATH LAB;  Service: Cardiovascular;  Laterality: N/A;  . multiple D&C    . TONSILLECTOMY  age 89    Family History  Problem Relation Age of Onset  . Cancer Brother        Colon  . Coronary artery disease Neg Hx   . Heart attack Neg Hx     Social History   Socioeconomic History  . Marital status: Widowed    Spouse name: Not on file  . Number of children: 3  . Years of education: 59  . Highest education level: Not on file  Social Needs  . Financial resource strain: Not on file  . Food insecurity - worry: Not on file  . Food insecurity - inability: Not on file  . Transportation needs - medical: Not on file  . Transportation needs - non-medical: Not on file  Occupational History  . Occupation: Scientist, water quality  Tobacco Use  . Smoking status: Never Smoker  . Smokeless tobacco: Never Used  Substance and Sexual Activity  . Alcohol use: No  . Drug use: No  . Sexual activity: No    Birth control/protection: Post-menopausal  Other Topics Concern  . Not on file  Social History Narrative   Widowed '13. 2 sons- '63, '66; one daughter-'69;    62 grandchildren--has raised 9 of her grandchildren 4 great grandchildren.   Lives with 2 granddaughters and adoptive son      Has living will   Probably would have son Deari Sessler as health care POA--not set up   Would accept resuscitation but no prolonged ventilation   Not sure about tube feeds   Review of Systems  Appetite is not great Weight went up though Sleeps fairly well    Objective:   Physical Exam        Assessment & Plan:

## 2017-06-23 NOTE — Assessment & Plan Note (Signed)
If A1c still high, will have her increase the lantus

## 2017-07-13 ENCOUNTER — Other Ambulatory Visit: Payer: Self-pay | Admitting: Internal Medicine

## 2017-07-15 ENCOUNTER — Ambulatory Visit: Payer: PPO | Admitting: Internal Medicine

## 2017-07-15 ENCOUNTER — Telehealth: Payer: Self-pay

## 2017-07-15 NOTE — Telephone Encounter (Signed)
Copied from [place]. Topic: Quick Communication - Appointment Cancellation >> Jul 15, 2017 12:05 PM Cynthia Singh wrote: Patient called to cancel appointment scheduled for 07-15-17. Patient has rescheduled their appointment.    Route to department's PEC pool.

## 2017-07-15 NOTE — Telephone Encounter (Signed)
No charge appt can be used as same day

## 2017-07-15 NOTE — Telephone Encounter (Signed)
Patient No Showed/ Canceled appt within 24 hours.   Please determine one of the following actions:   A. No follow-up necessary.  B. Follow-up urgent. Locate patient immediately.  C. Follow-up necessary. Contact patient and schedule visit in _______ days.  D. Follow-up advised. Contact patient and schedule visit in _______ weeks.    Please determine CHARGE or NO CHARGE.    Pt called on 07/15/17 at 12:03 to cancel appt 07/15/17 at 2:30. Excuse to cancel appt was due to pts son. Pt has rescheduled appt for March.

## 2017-07-21 ENCOUNTER — Other Ambulatory Visit: Payer: Self-pay | Admitting: Internal Medicine

## 2017-07-21 NOTE — Telephone Encounter (Signed)
Copied from [place] [phone]. Topic: Quick Communication - Rx Refill/Question >> Jul 21, 2017  1:35 PM Ether Griffins B wrote: Medication: oxyCODONE-acetaminophen (PERCOCET) 10-325 MG tablet   Has the patient contacted their pharmacy? Yes.     (Agent: If no, request that the patient contact the pharmacy for the refill.)   Preferred Pharmacy (with phone number or street name): [id number] [place], [place] - [address]: Please be advised that RX refills may take up to 3 business days. We ask that you follow-up with your pharmacy.

## 2017-07-21 NOTE — Telephone Encounter (Signed)
Last filled 06-23-17 #150 Last OV/CSA/UDS 06-23-17 Next OV 08-04-17

## 2017-07-22 MED ORDER — OXYCODONE-ACETAMINOPHEN 10-325 MG PO TABS: 1.0000 | ORAL_TABLET | Freq: Four times a day (QID) | ORAL | 0 refills | Status: DC | PRN

## 2017-07-22 NOTE — Telephone Encounter (Addendum)
E prescribed PCP reviewed [place] CSRS last month

## 2017-07-22 NOTE — Telephone Encounter (Signed)
Percocet refill request - controlled substance  Dr. Silvio Pate  [facility] [address] [place]   [address]

## 2017-07-22 NOTE — Telephone Encounter (Signed)
Copied from [place] [id number]. Topic: Quick Communication - Rx Refill/Question >> Jul 22, 2017 11:46 AM Cynthia Singh wrote: Medication: oxyCODONE-acetaminophen (PERCOCET) 10-325 MG tablet [[id number]]    Has the patient contacted their pharmacy? Yes.     (Agent: If no, request that the patient contact the pharmacy for the refill.)   [facility]   Agent: Please be advised that RX refills may take up to 3 business days. We ask that you follow-up with your pharmacy.

## 2017-07-22 NOTE — Telephone Encounter (Signed)
Patient calling back about medicine refill

## 2017-07-23 ENCOUNTER — Telehealth: Payer: Self-pay

## 2017-07-23 MED ORDER — OXYCODONE-ACETAMINOPHEN 10-325 MG PO TABS: 1.0000 | ORAL_TABLET | Freq: Four times a day (QID) | ORAL | 0 refills | Status: DC | PRN

## 2017-07-23 NOTE — Telephone Encounter (Signed)
Received fax from [place] stating pt needs a new rx with dx code included for Percocet.

## 2017-07-23 NOTE — Telephone Encounter (Addendum)
Angie with PEC said pt is calling again to get [facility]. I advised that [facility] CMA called dx code to [facility] at 2:38 pm. Angie will let pt know to ck with [facility] again.

## 2017-07-23 NOTE — Telephone Encounter (Signed)
E presribed new request.

## 2017-07-23 NOTE — Telephone Encounter (Signed)
Tameka with PEC calling; pt said [facility] does not have a [place]; [place] CMA said she spoke with Jenny Reichmann at [place] per instruction on phone note copied from [place] # [phone]. I called Lattie Haw at [place] and gave chronic back pain dx M543.9. Lattie Haw said would take about 15 mins and then rx be ready. Beau Fanny will let pt know.

## 2017-07-23 NOTE — Telephone Encounter (Signed)
Copied from [place] [phone]. Topic: General - Other >> Jul 23, 2017  2:08 PM Yvette Rack wrote: Reason for CRM: [id number] [place], [place] [phone] (Phone) [phone] (Fax) stating that the pharmacy is waiting on a code for the oxyCODONE-acetaminophen (PERCOCET) 10-325 MG tablet   patient is there to pick this RX up

## 2017-07-23 NOTE — Telephone Encounter (Signed)
Dx code given to [facility]

## 2017-08-04 ENCOUNTER — Telehealth: Payer: Self-pay

## 2017-08-04 ENCOUNTER — Ambulatory Visit: Payer: PPO | Admitting: Internal Medicine

## 2017-08-04 NOTE — Telephone Encounter (Signed)
PLEASE NOTE: All timestamps contained within this report are represented as [place] Standard Time. CONFIDENTIALTY NOTICE: This fax transmission is intended only for the addressee. It contains information that is legally privileged, confidential or otherwise protected from use or disclosure. If you are not the intended recipient, you are strictly prohibited from reviewing, disclosing, copying using or disseminating any of this information or taking any action in reliance on or regarding this information. If you have received this fax in error, please notify us immediately by telephone so that we can arrange for its return to [place]. Phone: [phone], Toll-Free: [phone], Fax: [phone] Page: 1 of 1 Call Id: [id number] [place] Night - Client Nonclinical Telephone Record [place] Night - Client Client Site [place] Physician [name] - MD Contact Type Call Who Is Calling Patient / Member / Family / Caregiver Caller Name Brittnye Josephs Caller Phone Number [phone] Patient Name Cynthia Singh Patient DOB 10-Apr-2041 Call Type Message Only Information Provided Reason for Call Request to [facility] Appointment Initial Comment Caller is needing to cancel her 10:30 for today. Additional Comment Call Closed By: Blanchie Dessert Transaction Date/Time: 08/04/2017 6:14:47 AM (ET)

## 2017-08-06 NOTE — Telephone Encounter (Signed)
No charge, I will send to charge correction

## 2017-08-23 ENCOUNTER — Other Ambulatory Visit: Payer: Self-pay | Admitting: Internal Medicine

## 2017-08-23 MED ORDER — OXYCODONE-ACETAMINOPHEN 10-325 MG PO TABS: 1.0000 | ORAL_TABLET | Freq: Four times a day (QID) | ORAL | 0 refills | Status: DC | PRN

## 2017-08-23 NOTE — Telephone Encounter (Signed)
Last filled 07-23-17 #150 Last OV 06-23-17 No Future OV Last UDS 06-22-17 Last CSA 06-23-17  Forward to Dr Glori Bickers in Dr Alla German absence

## 2017-08-23 NOTE — Telephone Encounter (Signed)
Will refill electronically  1 mo supply in absence of PCP

## 2017-08-23 NOTE — Telephone Encounter (Unsigned)
Copied from [place] [id number]. Topic: Quick Communication - Rx Refill/Question >> Aug 23, 2017 10:40 AM Neva Seat wrote: oxyCODONE-acetaminophen (PERCOCET) 10-325 MG tablet  Pt is out of Rx needing a refill asap.    [id number] [place] [place] - [address] [place] [address] Phone: [phone] Fax: [phone]

## 2017-09-10 ENCOUNTER — Other Ambulatory Visit: Payer: Self-pay | Admitting: Internal Medicine

## 2017-09-16 IMAGING — CR DG SHOULDER 2+V*L*
3 series · 3 of 3 positions shown · non-contrast
Comparison: None.

CLINICAL DATA: Trip and fall with bilateral shoulder pain.

EXAM:
LEFT SHOULDER - 2+ VIEW

[shoulder grashey]
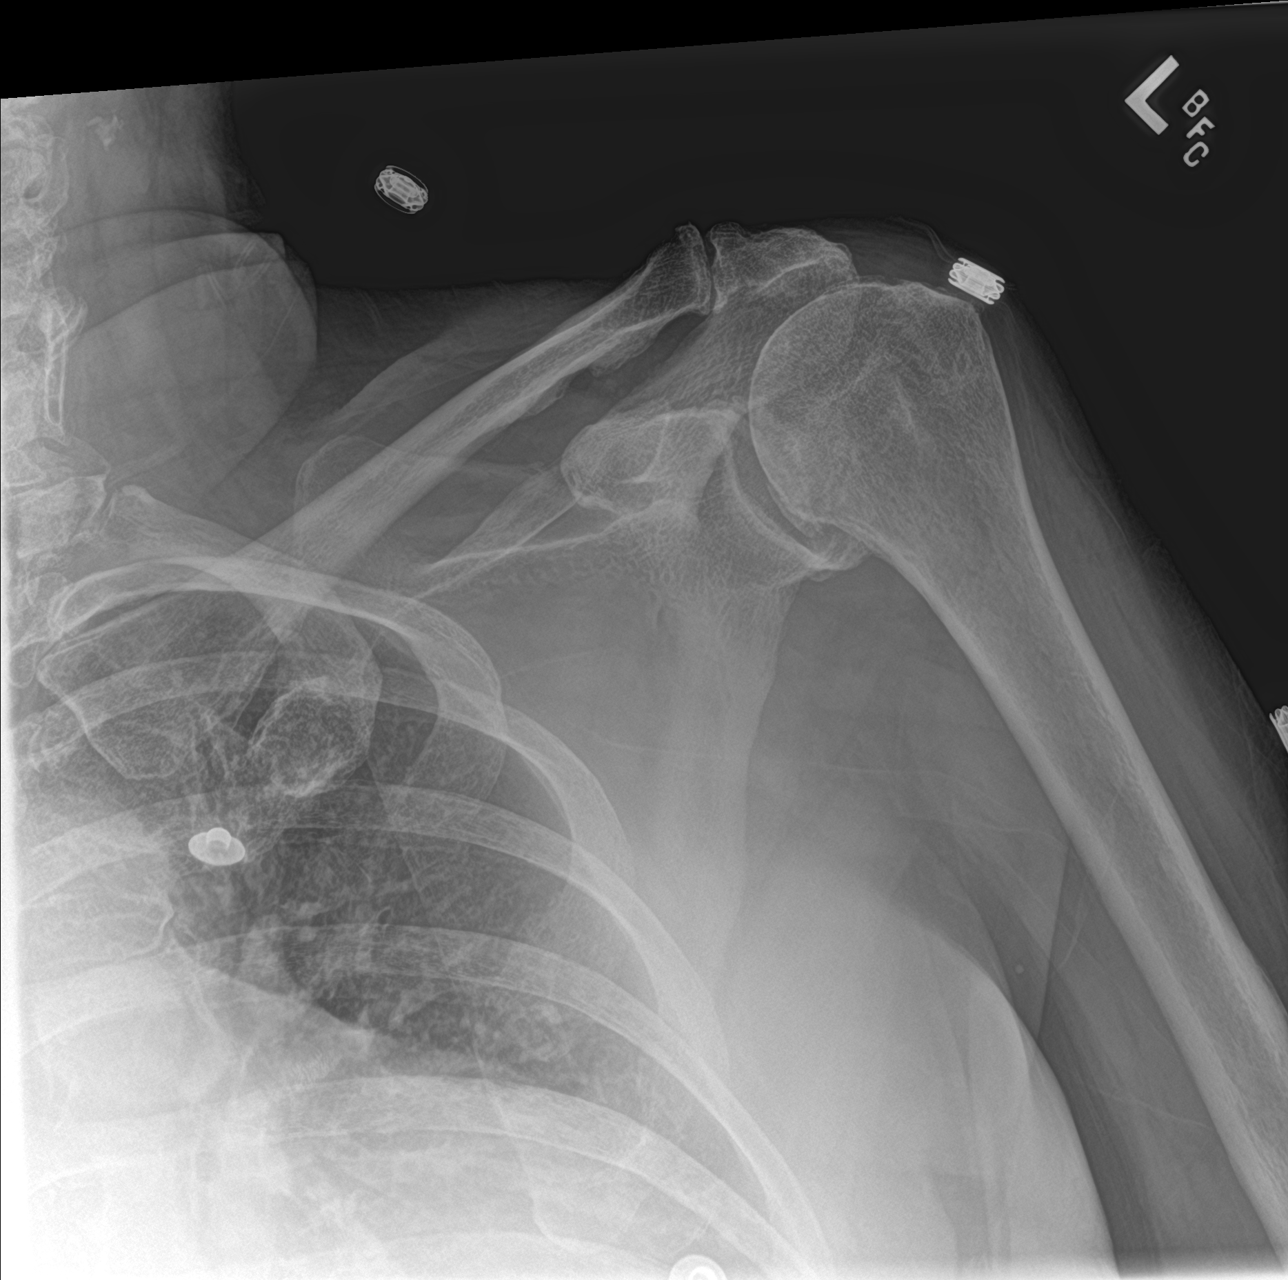

[shoulder y view (1 of 2)]
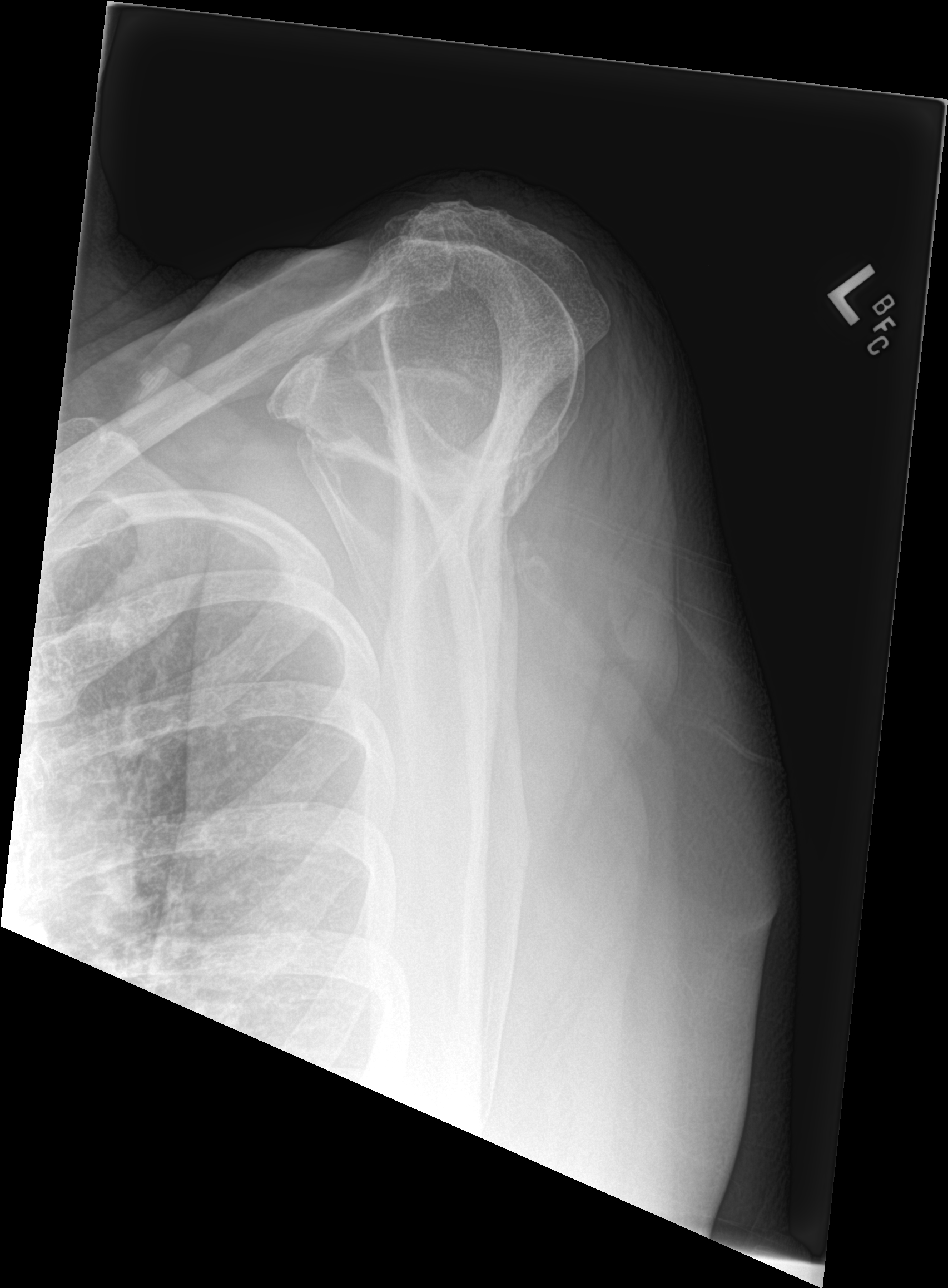

[shoulder y view (2 of 2)]
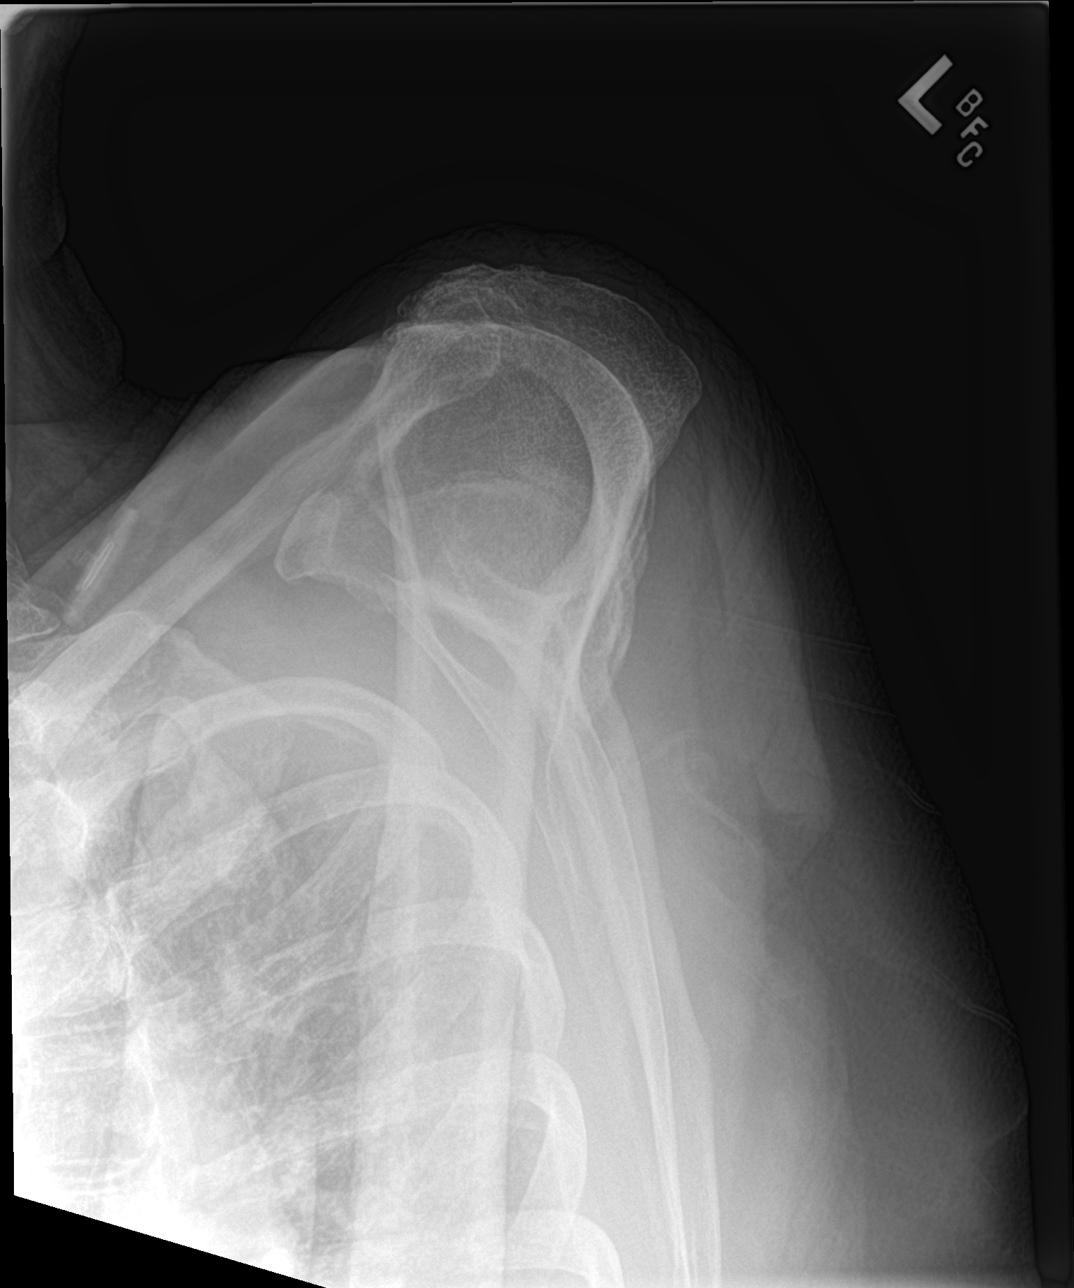

[3 of 3 positions shown; findings below may reference images not displayed]

FINDINGS: There is no evidence of fracture or dislocation. Osteoarthritis of
the acromioclavicular and glenohumeral joints. Questionable superior
subluxation of the humeral head versus artifact, suggesting rotator
cuff arthropathy. Soft tissues are unremarkable.
IMPRESSION: No acute fracture or subluxation of the left shoulder.

## 2017-09-16 IMAGING — CT CT CERVICAL SPINE W/O CM
3 of 4 series · 13 of 33 positions shown, 16 images · non-contrast
Comparison: CT of the head performed 06/26/2016, and MRI/MRA of the
brain performed 05/20/2009

CLINICAL DATA: Tripped and fell, hitting head on coffee table. Loss
of consciousness. Headache. Forehead scalp hematoma. Concern for
cervical spine injury. Initial encounter.

EXAM:
CT HEAD WITHOUT CONTRAST
CT CERVICAL SPINE WITHOUT CONTRAST
TECHNIQUE: Multidetector CT imaging of the head and cervical spine was
performed following the standard protocol without intravenous
contrast. Multiplanar CT image reconstructions of the cervical spine
were also generated.

[Series 3: head bone · axial · 0.42mm/px · z∈[-107,+33]mm · 5 of 90 slices shown, 7 images]
[im 10/90  soft-tissue]
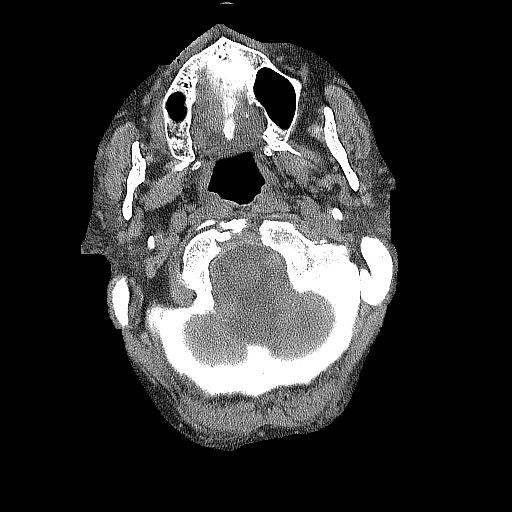
[im 10/90  bone]
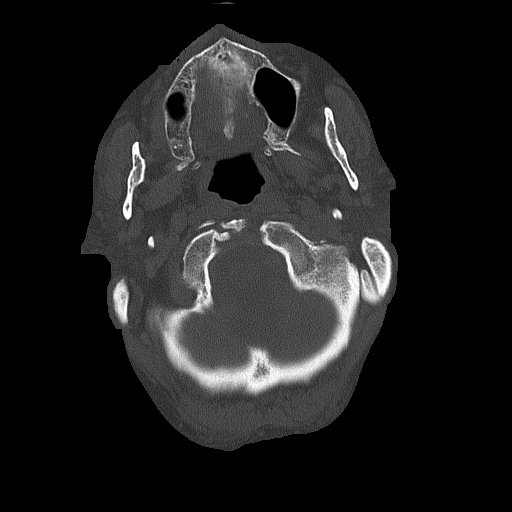
[im 30/90  bone]
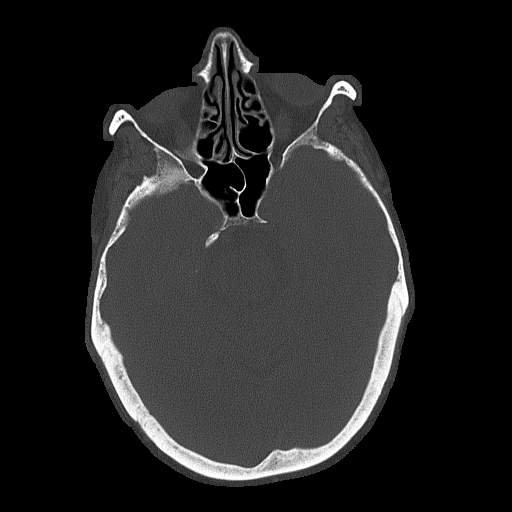
[im 50/90  bone]
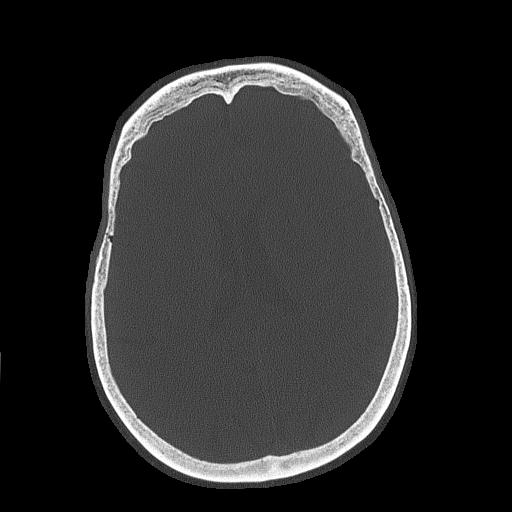
[im 60/90  bone]
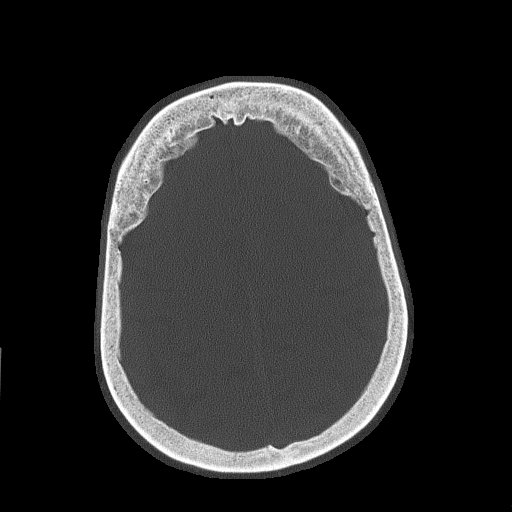
[im 80/90  soft-tissue]
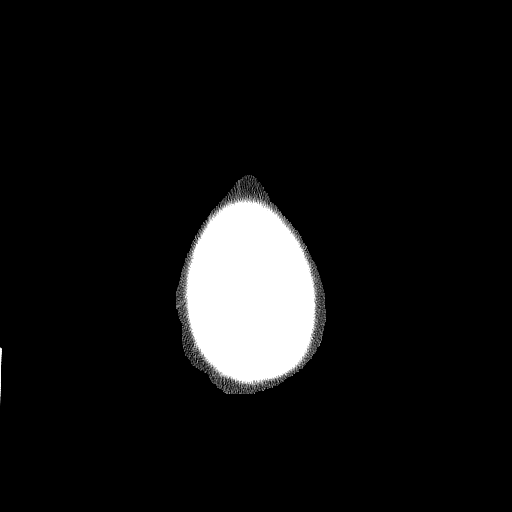
[im 80/90  bone]
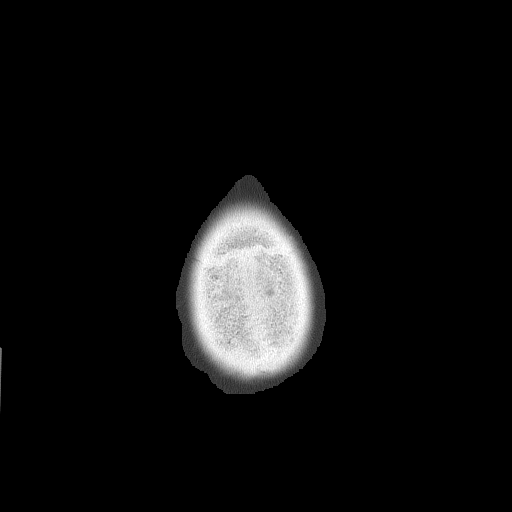

[Series 4: head without cor · coronal · non-contrast · 0.30mm/px · 3 of 68 slices shown]
[im 14/68  bone]
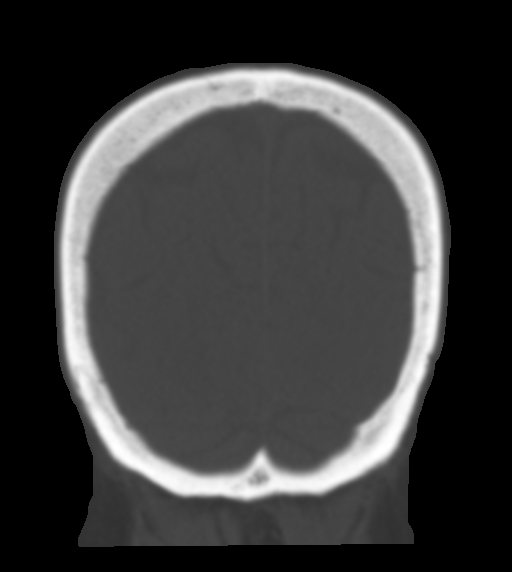
[im 27/68  bone]
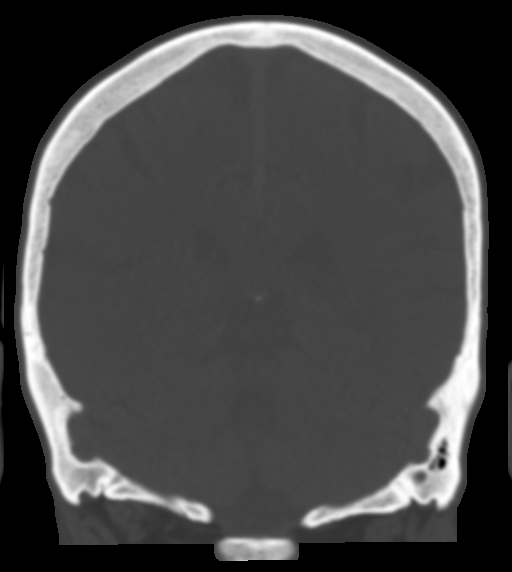
[im 41/68  bone]
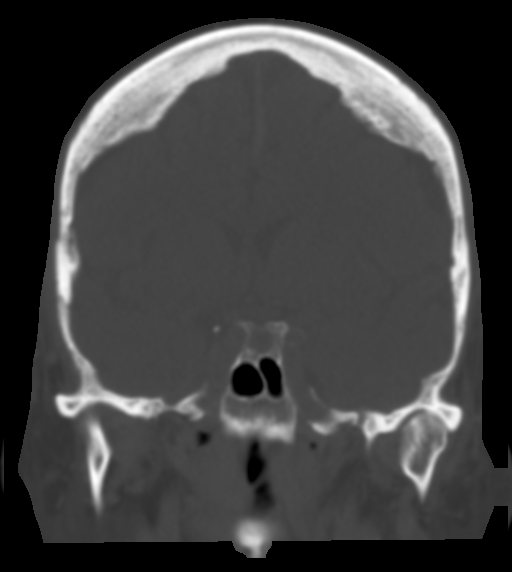

[Series 5: head without sag · sagittal · non-contrast · 0.34mm/px · 5 of 52 slices shown, 6 images]
[im 18/52  bone]
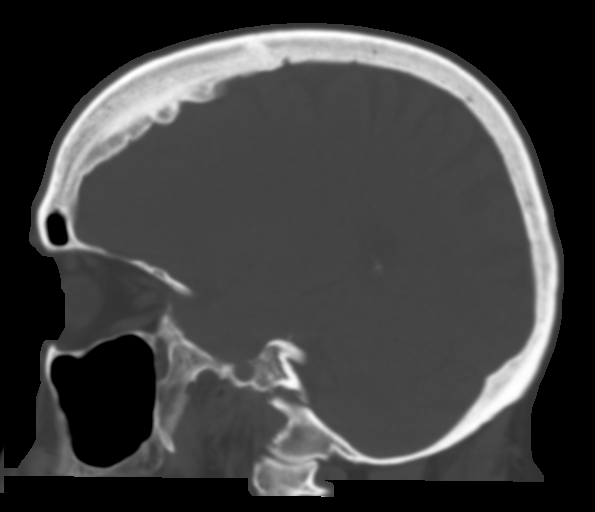
[im 22/52  bone]
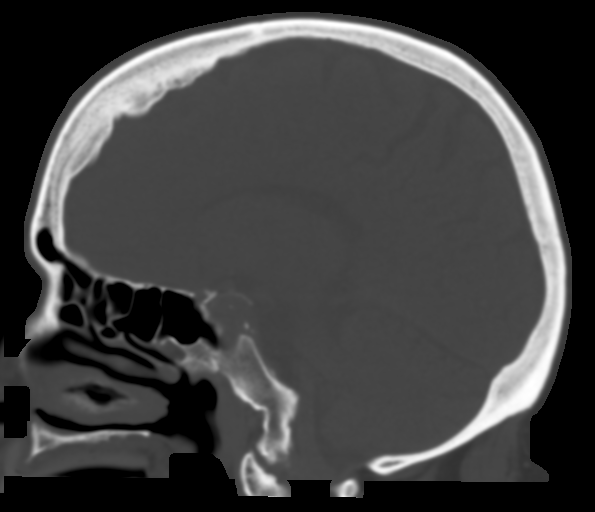
[im 26/52  soft-tissue]
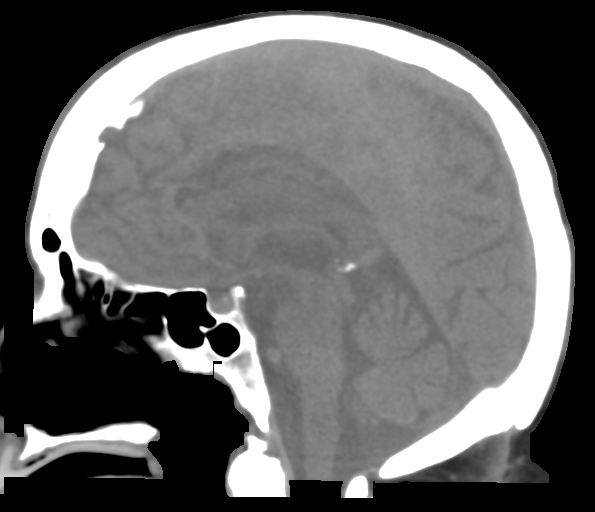
[im 26/52  bone]
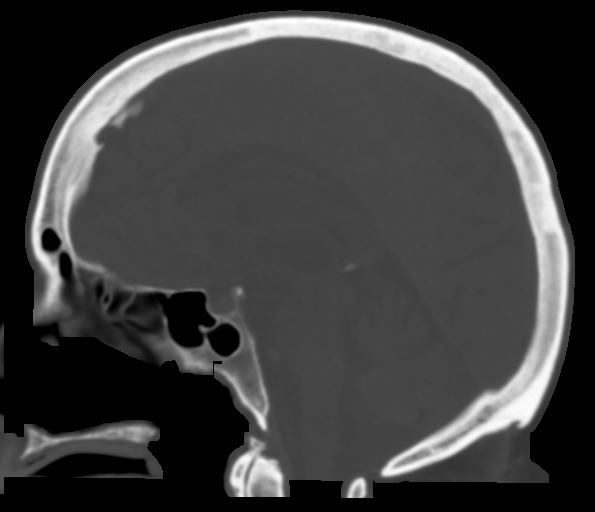
[im 30/52  bone]
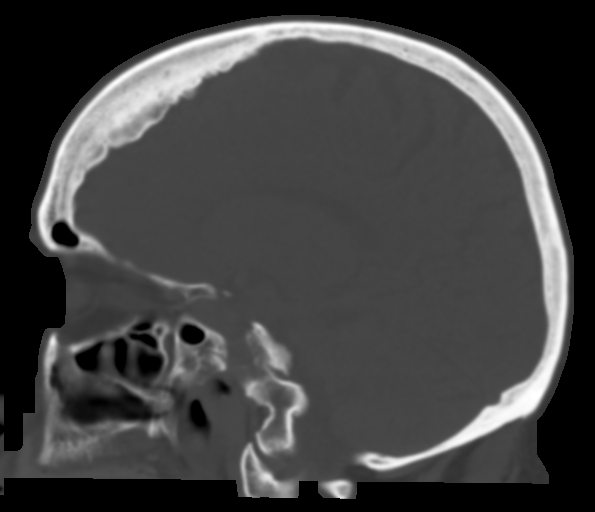
[im 35/52  bone]
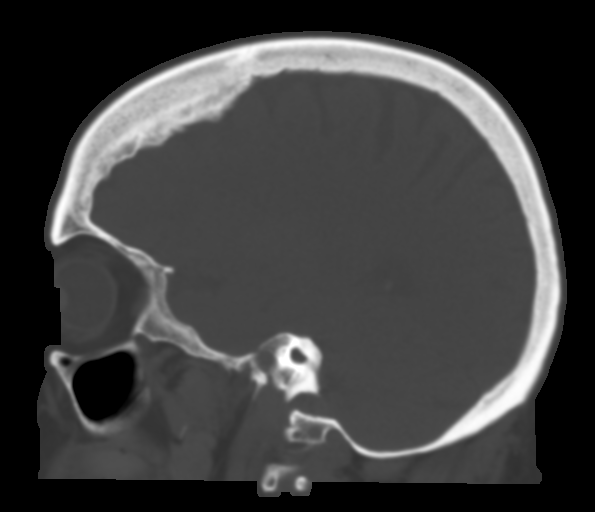

[13 of 33 positions shown; findings below may reference images not displayed]

FINDINGS: CT HEAD FINDINGS

Brain: Acute subdural bleed is noted tracking along both sides of
the falx cerebri, superiorly and posteriorly.

No mass lesion is seen. There is no evidence of acute infarction.
There is no evidence of hydrocephalus. Mild cerebellar atrophy is
noted.

The posterior fossa, including the cerebellum, brainstem and fourth
ventricle, is within normal limits. The third and lateral
ventricles, and basal ganglia are unremarkable in appearance. The
cerebral hemispheres are symmetric in appearance, with normal
gray-white differentiation. No mass effect or midline shift is seen.

Vascular: No hyperdense vessel or unexpected calcification.

Skull: There is no evidence of fracture; visualized osseous
structures are unremarkable in appearance.

Sinuses/Orbits: The orbits are within normal limits. The paranasal
sinuses and mastoid air cells are well-aerated.

Other: Mild soft tissue swelling is noted overlying the high frontal
calvarium.

CT CERVICAL SPINE FINDINGS

Alignment: Normal.

Skull base and vertebrae: No acute fracture. No primary bone lesion
or focal pathologic process.

Soft tissues and spinal canal: No prevertebral fluid or swelling. No
visible canal hematoma.

Disc levels: Scattered anterior and posterior disc osteophyte
complexes are noted along the cervical spine. Mild degenerative
change is noted about the dens. Intervertebral disc spaces are
grossly preserved.

Upper chest: The thyroid gland is grossly unremarkable in
appearance. The visualized lung apices are clear. Calcification is
seen at the carotid bifurcations bilaterally, with likely mild to
moderate left-sided luminal narrowing.

Other: No additional soft tissue abnormalities are seen.
IMPRESSION: 1. Acute subdural bleed tracking along both sides of the falx
cerebri, superiorly and posteriorly.
2. No evidence of fracture or subluxation along the cervical spine.
3. Mild soft tissue swelling overlying the high frontal calvarium.
4. Mild degenerative change along the cervical spine.
5. Calcification at the carotid bifurcations bilaterally, with
likely mild to moderate left-sided luminal narrowing. Carotid
ultrasound is recommended for further evaluation, when and as deemed
clinically appropriate.
Critical Value/emergent results were called by telephone at the time
of interpretation on 07/19/2016 at [DATE] to Dr. HSR ZANK, who
verbally acknowledged these results.

## 2017-10-14 ENCOUNTER — Other Ambulatory Visit: Payer: Self-pay | Admitting: Internal Medicine

## 2017-10-14 NOTE — Telephone Encounter (Signed)
Copied from [place] [phone]. Topic: Quick Communication - Rx Refill/Question >> Oct 14, 2017 12:50 PM Neva Seat wrote: oxyCODONE-acetaminophen (PERCOCET) 10-325 MG tablet  Needing refill  [id number] [place] - [address] [place] [address] Phone: [phone] Fax: [phone]

## 2017-10-15 MED ORDER — OXYCODONE-ACETAMINOPHEN 10-325 MG PO TABS: 1.0000 | ORAL_TABLET | Freq: Four times a day (QID) | ORAL | 0 refills | Status: DC | PRN

## 2017-10-15 NOTE — Telephone Encounter (Signed)
Spoke to pt and advised. 3 mo f/u scheduled.

## 2017-10-15 NOTE — Telephone Encounter (Signed)
She is overdue for follow up Check CSRS please and set her up soon

## 2017-10-15 NOTE — Telephone Encounter (Signed)
LOV  06/23/17 Dr. Silvio Pate Last refill 08/23/17  # 150  0 refills

## 2017-10-15 NOTE — Telephone Encounter (Signed)
Refill info verified and UDS on 06/22/17.

## 2017-10-19 ENCOUNTER — Encounter: Payer: Self-pay | Admitting: Internal Medicine

## 2017-10-19 ENCOUNTER — Ambulatory Visit (INDEPENDENT_AMBULATORY_CARE_PROVIDER_SITE_OTHER): Payer: PPO | Admitting: Internal Medicine

## 2017-10-19 VITALS — BP 118/70 | HR 82 | Temp 97.7°F | Ht 68.0 in | Wt 197.0 lb

## 2017-10-19 DIAGNOSIS — E1165 Type 2 diabetes mellitus with hyperglycemia: Secondary | ICD-10-CM

## 2017-10-19 DIAGNOSIS — G8929 Other chronic pain: Secondary | ICD-10-CM | POA: Diagnosis not present

## 2017-10-19 DIAGNOSIS — IMO0002 Reserved for concepts with insufficient information to code with codable children: Secondary | ICD-10-CM

## 2017-10-19 DIAGNOSIS — M5442 Lumbago with sciatica, left side: Secondary | ICD-10-CM | POA: Diagnosis not present

## 2017-10-19 DIAGNOSIS — M5441 Lumbago with sciatica, right side: Secondary | ICD-10-CM | POA: Diagnosis not present

## 2017-10-19 DIAGNOSIS — E1140 Type 2 diabetes mellitus with diabetic neuropathy, unspecified: Secondary | ICD-10-CM

## 2017-10-19 DIAGNOSIS — F1120 Opioid dependence, uncomplicated: Secondary | ICD-10-CM | POA: Diagnosis not present

## 2017-10-19 LAB — HEMOGLOBIN A1C: Hgb A1c MFr Bld: 11.6 % — ABNORMAL HIGH (ref 4.6–6.5)

## 2017-10-19 MED ORDER — METFORMIN HCL ER 500 MG PO TB24: 1000.0000 mg | ORAL_TABLET | Freq: Every day | ORAL | 11 refills | Status: DC

## 2017-10-19 NOTE — Patient Instructions (Addendum)
Please take miralax---1 capful in a full glass of water every day. Start the metformin with once daily with breakfast. If you have no problems after a week (like stomach upset or diarrhea), increase to 2 tabs daily with breakfast. Cynthia Singh will call if you need to increase your insulin more----you need to call me if your fasting sugars remain over 180 on the metformin.

## 2017-10-19 NOTE — Assessment & Plan Note (Signed)
Has had a harder time with less hydrocodone No change though

## 2017-10-19 NOTE — Assessment & Plan Note (Signed)
CSRS reviewed--no concerns Had UDS in February New contract done

## 2017-10-19 NOTE — Assessment & Plan Note (Signed)
Didn't increase the lantus as I had instructed Will try metformin again and titrate the insulin

## 2017-10-19 NOTE — Progress Notes (Signed)
Subjective:    Patient ID: Cynthia Singh, female    DOB: [birth year], 77 y.o.   MRN: [id number]  HPI Here for follow up of chronic pain and uncontrolled diabetes  Still not sleeping well--despite the nortriptyline The amitriptyline was more sedating--but led to ER visit Having a lot of pain with the lower dosing of the oxycodone  Only raised the lantus to 22 units Sugars still over 200 much of the time Not sure why she came off metformin  Current Outpatient Medications on File Prior to Visit  Medication Sig Dispense Refill  . aspirin EC 81 MG tablet Take 81 mg by mouth daily.    Marland Kitchen atorvastatin (LIPITOR) 80 MG tablet Take 0.5 tablets (40 mg total) by mouth daily. 90 tablet 3  . carvedilol (COREG) 12.5 MG tablet Take 1 tablet (12.5 mg total) by mouth 2 (two) times daily with a meal. 60 tablet 5  . clopidogrel (PLAVIX) 75 MG tablet TAKE 1 TABLET BY MOUTH DAILY 15 tablet 0  . glipiZIDE (GLUCOTROL) 5 MG tablet TAKE ONE TABLET BY MOUTH TWO TIMES DAILY BEFORE A MEAL 180 tablet 1  . Insulin Glargine (LANTUS SOLOSTAR) 100 UNIT/ML Solostar Pen Take 22 units daily. May titrate up as directed 5 pen 11  . Insulin Pen Needle (PEN NEEDLES) 32G X 5 MM MISC 1 Units by Does not apply route daily. 100 each 3  . levETIRAcetam (KEPPRA) 500 MG tablet TAKE 1 TABLET BY MOUTH TWICE A DAY 60 tablet 11  . losartan-hydrochlorothiazide (HYZAAR) 100-12.5 MG tablet TAKE 1 TABLET BY MOUTH DAILY 90 tablet 3  . nitroGLYCERIN (NITROSTAT) 0.4 MG SL tablet Place 1 tablet (0.4 mg total) under the tongue every 5 (five) minutes as needed for chest pain. 30 tablet 0  . nortriptyline (PAMELOR) 50 MG capsule Take 3 capsules (150 mg total) by mouth at bedtime. 90 capsule 11  . Omega-3 Fatty Acids (FISH OIL PO) Take 1 capsule by mouth daily.    Marland Kitchen oxyCODONE-acetaminophen (PERCOCET) 10-325 MG tablet Take 1-2 tablets by mouth every 6 (six) hours as needed for pain. M54.9 150 tablet 0  . ranitidine (ZANTAC) 150 MG tablet Take 150  mg by mouth daily as needed for heartburn.     No current facility-administered medications on file prior to visit.     No Known Allergies  Past Medical History:  Diagnosis Date  . CAD (coronary artery disease)    a. Remote nonobstructive disease but in 10/2012 Cath/PCI: s/p DES to RCA. b. cath 02/08/15 DES to prox LAD and balloon angioplasty of ost D1, EF 55-65%.  . Concussion    [place] AFTER FALL 3/18  . Depression   . Diabetes mellitus, type 2 ([place])   . Dyslipidemia   . Episodic mood disorder ([place])   . Hypertension   . Myocardial infarction ([place])    2014  . Obesity   . Osteoarthritis of spine    knees also    Past Surgical History:  Procedure Laterality Date  . ABDOMINAL HYSTERECTOMY    . APPENDECTOMY  1959  . CARDIAC CATHETERIZATION N/A 02/08/2015   Procedure: Left Heart Cath and Coronary Angiography;  Surgeon: Sherren Mocha, MD;  Location: [facility];  Service: Cardiovascular;  Laterality: N/A;  . CARDIAC CATHETERIZATION N/A 02/08/2015   Procedure: Coronary Stent Intervention;  Surgeon: Sherren Mocha, MD;  Location: [facility];  Service: Cardiovascular;  Laterality: N/A;  . CARDIAC CATHETERIZATION N/A 02/08/2015   Procedure: Intravascular Pressure Wire/FFR Study;  Surgeon: Sherren Mocha, MD;  Location: [facility];  Service: Cardiovascular;  Laterality: N/A;  . CATARACT EXTRACTION W/PHACO Left 11/10/2016   Procedure: CATARACT EXTRACTION PHACO AND INTRAOCULAR LENS PLACEMENT ([place]) suture placed in left eye at end of procedure;  Surgeon: Birder Robson, MD;  Location: [facility];  Service: Ophthalmology;  Laterality: Left;  [place]  3.20 AP% 27.7 CDE 55.63 Fluid pack lot # [id number]  . CHOLECYSTECTOMY    . COMBINED HYSTERECTOMY ABDOMINAL W/ A&P REPAIR / OOPHORECTOMY  1968  . CORONARY ANGIOPLASTY     STENTS X 5  . CORONARY STENT PLACEMENT  2014  . DILATION AND CURETTAGE OF UTERUS    . [place]  . LEFT HEART CATHETERIZATION WITH CORONARY  ANGIOGRAM N/A 10/20/2012   Procedure: LEFT HEART CATHETERIZATION WITH CORONARY ANGIOGRAM;  Surgeon: Sherren Mocha, MD;  Location: [facility];  Service: Cardiovascular;  Laterality: N/A;  . multiple D&C    . TONSILLECTOMY  age 23    Family History  Problem Relation Age of Onset  . Cancer Brother        Colon  . Coronary artery disease Neg Hx   . Heart attack Neg Hx     Social History   Socioeconomic History  . Marital status: Widowed    Spouse name: Not on file  . Number of children: 3  . Years of education: 55  . Highest education level: Not on file  Occupational History  . Occupation: Scientist, water quality  Social Needs  . Financial resource strain: Not on file  . Food insecurity:    Worry: Not on file    Inability: Not on file  . Transportation needs:    Medical: Not on file    Non-medical: Not on file  Tobacco Use  . Smoking status: Never Smoker  . Smokeless tobacco: Never Used  Substance and Sexual Activity  . Alcohol use: No  . Drug use: No  . Sexual activity: Never    Birth control/protection: Post-menopausal  Lifestyle  . Physical activity:    Days per week: Not on file    Minutes per session: Not on file  . Stress: Not on file  Relationships  . Social connections:    Talks on phone: Not on file    Gets together: Not on file    Attends religious service: Not on file    Active member of club or organization: Not on file    Attends meetings of clubs or organizations: Not on file    Relationship status: Not on file  . Intimate partner violence:    Fear of current or ex partner: Not on file    Emotionally abused: Not on file    Physically abused: Not on file    Forced sexual activity: Not on file  Other Topics Concern  . Not on file  Social History Narrative   Widowed '13. 2 sons- '63, '66; one daughter-'69;    13 grandchildren--has raised 9 of her grandchildren 4 great grandchildren.   Lives with 2 granddaughters and adoptive son      Has living will   Probably  would have son Troyce Febo as health care POA--not set up   Would accept resuscitation but no prolonged ventilation   Not sure about tube feeds   Review of Systems  Bowels are slow--- may go a week without going Uses ex-lax. Uses miralax at times---but not consistent     Objective:   Physical Exam  Constitutional: She appears  well-developed. No distress.  Neck: No thyromegaly present.  Cardiovascular: Normal rate, regular rhythm and normal heart sounds. Exam reveals no gallop.  No murmur heard. Faint pedal pulses  Respiratory: Effort normal and breath sounds normal. No respiratory distress. She has no wheezes. She has no rales.  Musculoskeletal: She exhibits no edema.  Lymphadenopathy:    She has no cervical adenopathy.  Skin:  No foot lesions           Assessment & Plan:

## 2017-11-15 ENCOUNTER — Other Ambulatory Visit: Payer: Self-pay | Admitting: Internal Medicine

## 2017-11-15 NOTE — Telephone Encounter (Signed)
Copied from [place] [phone]. Topic: Quick Communication - Rx Refill/Question >> Nov 15, 2017  9:03 AM Scherrie Gerlach wrote: Medication: oxyCODONE-acetaminophen (PERCOCET) 10-325 MG tablet  [id number] [place], [place] - [address] [phone] (Phone) [phone] (Fax)  Last OV  10/19/17

## 2017-11-17 ENCOUNTER — Telehealth: Payer: Self-pay

## 2017-11-17 MED ORDER — OXYCODONE-ACETAMINOPHEN 10-325 MG PO TABS: 1.0000 | ORAL_TABLET | Freq: Four times a day (QID) | ORAL | 0 refills | Status: DC | PRN

## 2017-11-17 NOTE — Telephone Encounter (Signed)
Error

## 2017-11-17 NOTE — Telephone Encounter (Signed)
I spoke with pt and advised oxycodone apap rx was sent to [facility]. I apologized again for the delay in filling due to a computer issue.Pt appreciative and will ck with pharmacy for pick up.

## 2017-11-17 NOTE — Telephone Encounter (Signed)
Name of Medication: oxycodone apap 10-325 Name of Pharmacy[facility] or Written Date and Quantity: # 150 on 10/15/17 Last Office Visit and Type: 10/19/17 3 mth narcotic ck Next Office Visit and Type: 11/24/17 mole ck Last Controlled Substance Agreement Date: 06/23/17 Last UDS:06/22/17

## 2017-11-24 ENCOUNTER — Ambulatory Visit: Payer: PPO | Admitting: Internal Medicine

## 2017-12-10 ENCOUNTER — Other Ambulatory Visit: Payer: Self-pay | Admitting: Internal Medicine

## 2017-12-10 NOTE — Telephone Encounter (Signed)
Copied from [place] [id number]. Topic: Quick Communication - Rx Refill/Question >> Dec 10, 2017  2:34 PM Keene Breath wrote: Medication: oxyCODONE-acetaminophen (PERCOCET) 10-325 MG tablet  Patient called to get a refill on the above medication.  CB# [phone]  Preferred Pharmacy (with phone number or street name): [id number] [place], [place] - [address] [phone] (Phone) [phone] (Fax)

## 2017-12-13 NOTE — Telephone Encounter (Signed)
Percocet  LRF 11/17/17  #150  0 refills  LOV 10/19/17 Dr. Silvio Pate  [id number]  Cynthia Singh

## 2017-12-14 MED ORDER — OXYCODONE-ACETAMINOPHEN 10-325 MG PO TABS: 1.0000 | ORAL_TABLET | Freq: Four times a day (QID) | ORAL | 0 refills | Status: DC | PRN

## 2017-12-14 NOTE — Telephone Encounter (Signed)
Approved to fill as of Thursday

## 2017-12-14 NOTE — Telephone Encounter (Signed)
Pt requested medication over 1 week early on 12-10-17. Last filled 11-17-17 #150 confirmed by PMP Aware.  Last OV 10-19-17  No Future OV Last UDS 06-22-17 Last CSA 06-23-17  Next fill not due until 12-17-17

## 2017-12-14 NOTE — Addendum Note (Signed)
Addended by: [name] I on: 12/14/2017 12:59 PM   Modules accepted: Orders

## 2017-12-14 NOTE — Telephone Encounter (Signed)
Patient is calling and states she knows she could not get this medicine until 12/13/17 but called it in Friday 12/10/17 so that she could get it on time due to prescription refill turn around time. Patient would like this called in by today.

## 2017-12-14 NOTE — Addendum Note (Signed)
Addended by: Pilar Grammes on: 12/14/2017 12:02 PM   Modules accepted: Orders

## 2018-01-11 ENCOUNTER — Other Ambulatory Visit: Payer: Self-pay | Admitting: Internal Medicine

## 2018-01-11 NOTE — Telephone Encounter (Signed)
Copied from [place]. Topic: Quick Communication - Rx Refill/Question >> Jan 11, 2018  3:24 PM Waldemar Dickens, Sade R wrote: Medication: oxyCODONE-acetaminophen (PERCOCET) 10-325 MG tablet  Has the patient contacted their pharmacy? Yes  Preferred Pharmacy (with phone number or street name): [id number] [place], [place] - [address] [phone] (Phone) [phone] (Fax)

## 2018-01-11 NOTE — Telephone Encounter (Signed)
If she is running out early, I don't feel comfortable filling. That's a lot of Percocet. Will defer to PCP.

## 2018-01-11 NOTE — Telephone Encounter (Signed)
Name of Medication: Percocet 10-325 [facility] Last Fill or Written Date and Quantity: # 150 on 12/14/17 Last Office Visit and Type: 10/19/17 Next Office Visit and Type: 01/21/18 3 mth FU Last Controlled Substance Agreement Date: 06/23/17 Last UDS:06/22/17  Dr Silvio Pate out of office this afternoon and pt is out of medication.Please advise.

## 2018-01-12 NOTE — Telephone Encounter (Signed)
She is quite a bit early I can't refill till Friday---before the holiday weekend

## 2018-01-12 NOTE — Telephone Encounter (Signed)
Left message on VM that Dr Silvio Pate will not fill it until Friday.

## 2018-01-13 NOTE — Telephone Encounter (Signed)
To be done tomorrow

## 2018-01-14 MED ORDER — OXYCODONE-ACETAMINOPHEN 10-325 MG PO TABS: 1.0000 | ORAL_TABLET | Freq: Four times a day (QID) | ORAL | 0 refills | Status: DC | PRN

## 2018-01-21 ENCOUNTER — Ambulatory Visit (INDEPENDENT_AMBULATORY_CARE_PROVIDER_SITE_OTHER): Payer: PPO | Admitting: Internal Medicine

## 2018-01-21 ENCOUNTER — Encounter: Payer: Self-pay | Admitting: Internal Medicine

## 2018-01-21 VITALS — BP 124/84 | HR 78 | Temp 97.5°F | Ht 68.0 in | Wt 193.0 lb

## 2018-01-21 DIAGNOSIS — G8929 Other chronic pain: Secondary | ICD-10-CM

## 2018-01-21 DIAGNOSIS — M5441 Lumbago with sciatica, right side: Secondary | ICD-10-CM

## 2018-01-21 DIAGNOSIS — E1165 Type 2 diabetes mellitus with hyperglycemia: Secondary | ICD-10-CM | POA: Diagnosis not present

## 2018-01-21 DIAGNOSIS — Z23 Encounter for immunization: Secondary | ICD-10-CM | POA: Diagnosis not present

## 2018-01-21 DIAGNOSIS — E1140 Type 2 diabetes mellitus with diabetic neuropathy, unspecified: Secondary | ICD-10-CM

## 2018-01-21 DIAGNOSIS — M5442 Lumbago with sciatica, left side: Secondary | ICD-10-CM | POA: Diagnosis not present

## 2018-01-21 DIAGNOSIS — F1120 Opioid dependence, uncomplicated: Secondary | ICD-10-CM

## 2018-01-21 DIAGNOSIS — IMO0002 Reserved for concepts with insufficient information to code with codable children: Secondary | ICD-10-CM

## 2018-01-21 LAB — POCT GLYCOSYLATED HEMOGLOBIN (HGB A1C): Hemoglobin A1C: 10.7 % — AB (ref 4.0–5.6)

## 2018-01-21 MED ORDER — INSULIN GLARGINE 100 UNIT/ML SOLOSTAR PEN: PEN_INJECTOR | SUBCUTANEOUS | 0 refills | Status: DC

## 2018-01-21 NOTE — Assessment & Plan Note (Signed)
Checked CSRS No concerns

## 2018-01-21 NOTE — Assessment & Plan Note (Addendum)
She feels she is being compliant with eating Did increase insulin and taking metformin again Lab Results  Component Value Date   HGBA1C 10.7 (A) 01/21/2018   Still very out of control Will set up with endocrine

## 2018-01-21 NOTE — Assessment & Plan Note (Signed)
Stable pain and function On the oxycodone

## 2018-01-21 NOTE — Progress Notes (Signed)
Subjective:    Patient ID: Cynthia Singh, female    DOB: May 31, 1940, 77 y.o.   MRN: [id number]  HPI Here for follow up of chronic pain syndrome and out of control diabetes  No change in back pain Uses the oxycodone regularly--same dose  Back on the metformin-- 2 daily Up to 30 units of insulin Checking sugars twice a day--- close to 200 fasting still No low sugar reactions  Current Outpatient Medications on File Prior to Visit  Medication Sig Dispense Refill  . aspirin EC 81 MG tablet Take 81 mg by mouth daily.    Marland Kitchen atorvastatin (LIPITOR) 80 MG tablet Take 0.5 tablets (40 mg total) by mouth daily. 90 tablet 3  . carvedilol (COREG) 12.5 MG tablet Take 1 tablet (12.5 mg total) by mouth 2 (two) times daily with a meal. 60 tablet 5  . clopidogrel (PLAVIX) 75 MG tablet TAKE 1 TABLET BY MOUTH DAILY 15 tablet 0  . glipiZIDE (GLUCOTROL) 5 MG tablet TAKE ONE TABLET BY MOUTH TWO TIMES DAILY BEFORE A MEAL 180 tablet 1  . Insulin Pen Needle (PEN NEEDLES) 32G X 5 MM MISC 1 Units by Does not apply route daily. 100 each 3  . levETIRAcetam (KEPPRA) 500 MG tablet TAKE 1 TABLET BY MOUTH TWICE A DAY 60 tablet 11  . losartan-hydrochlorothiazide (HYZAAR) 100-12.5 MG tablet TAKE 1 TABLET BY MOUTH DAILY 90 tablet 3  . metFORMIN (GLUCOPHAGE-XR) 500 MG 24 hr tablet Take 2 tablets (1,000 mg total) by mouth daily with breakfast. 60 tablet 11  . nitroGLYCERIN (NITROSTAT) 0.4 MG SL tablet Place 1 tablet (0.4 mg total) under the tongue every 5 (five) minutes as needed for chest pain. 30 tablet 0  . nortriptyline (PAMELOR) 50 MG capsule Take 3 capsules (150 mg total) by mouth at bedtime. 90 capsule 11  . Omega-3 Fatty Acids (FISH OIL PO) Take 1 capsule by mouth daily.    Marland Kitchen oxyCODONE-acetaminophen (PERCOCET) 10-325 MG tablet Take 1-2 tablets by mouth every 6 (six) hours as needed for pain. M54.9 150 tablet 0  . polyethylene glycol (MIRALAX / GLYCOLAX) packet Take 17 g by mouth daily.    . ranitidine (ZANTAC)  150 MG tablet Take 150 mg by mouth daily as needed for heartburn.     No current facility-administered medications on file prior to visit.     No Known Allergies  Past Medical History:  Diagnosis Date  . CAD (coronary artery disease)    a. Remote nonobstructive disease but in 10/2012 Cath/PCI: s/p DES to RCA. b. cath 02/08/15 DES to prox LAD and balloon angioplasty of ost D1, EF 55-65%.  . Concussion    [place] AFTER FALL 3/18  . Depression   . Diabetes mellitus, type 2 ([place])   . Dyslipidemia   . Episodic mood disorder ([place])   . Hypertension   . Myocardial infarction ([place])    2014  . Obesity   . Osteoarthritis of spine    knees also    Past Surgical History:  Procedure Laterality Date  . ABDOMINAL HYSTERECTOMY    . APPENDECTOMY  1959  . CARDIAC CATHETERIZATION N/A 02/08/2015   Procedure: Left Heart Cath and Coronary Angiography;  Surgeon: Cynthia Mocha, MD;  Location: [facility];  Service: Cardiovascular;  Laterality: N/A;  . CARDIAC CATHETERIZATION N/A 02/08/2015   Procedure: Coronary Stent Intervention;  Surgeon: Cynthia Mocha, MD;  Location: [facility];  Service: Cardiovascular;  Laterality: N/A;  . CARDIAC CATHETERIZATION N/A 02/08/2015  Procedure: Intravascular Pressure Wire/FFR Study;  Surgeon: Cynthia Mocha, MD;  Location: [facility];  Service: Cardiovascular;  Laterality: N/A;  . CATARACT EXTRACTION W/PHACO Left 11/10/2016   Procedure: CATARACT EXTRACTION PHACO AND INTRAOCULAR LENS PLACEMENT ([place]) suture placed in left eye at end of procedure;  Surgeon: Cynthia Robson, MD;  Location: [facility];  Service: Ophthalmology;  Laterality: Left;  [place]  3.20 AP% 27.7 CDE 55.63 Fluid pack lot # [id number]  . CHOLECYSTECTOMY    . COMBINED HYSTERECTOMY ABDOMINAL W/ A&P REPAIR / OOPHORECTOMY  1968  . CORONARY ANGIOPLASTY     STENTS X 5  . CORONARY STENT PLACEMENT  2014  . DILATION AND CURETTAGE OF UTERUS    . [place]  . LEFT HEART  CATHETERIZATION WITH CORONARY ANGIOGRAM N/A 10/20/2012   Procedure: LEFT HEART CATHETERIZATION WITH CORONARY ANGIOGRAM;  Surgeon: Cynthia Mocha, MD;  Location: [facility];  Service: Cardiovascular;  Laterality: N/A;  . multiple D&C    . TONSILLECTOMY  age 60    Family History  Problem Relation Age of Onset  . Cancer Brother        Colon  . Coronary artery disease Neg Hx   . Heart attack Neg Hx     Social History   Socioeconomic History  . Marital status: Widowed    Spouse name: Not on file  . Number of children: 3  . Years of education: 48  . Highest education level: Not on file  Occupational History  . Occupation: Scientist, water quality  Social Needs  . Financial resource strain: Not on file  . Food insecurity:    Worry: Not on file    Inability: Not on file  . Transportation needs:    Medical: Not on file    Non-medical: Not on file  Tobacco Use  . Smoking status: Never Smoker  . Smokeless tobacco: Never Used  Substance and Sexual Activity  . Alcohol use: No  . Drug use: No  . Sexual activity: Never    Birth control/protection: Post-menopausal  Lifestyle  . Physical activity:    Days per week: Not on file    Minutes per session: Not on file  . Stress: Not on file  Relationships  . Social connections:    Talks on phone: Not on file    Gets together: Not on file    Attends religious service: Not on file    Active member of club or organization: Not on file    Attends meetings of clubs or organizations: Not on file    Relationship status: Not on file  . Intimate partner violence:    Fear of current or ex partner: Not on file    Emotionally abused: Not on file    Physically abused: Not on file    Forced sexual activity: Not on file  Other Topics Concern  . Not on file  Social History Narrative   Widowed '13. 2 sons- '63, '66; one daughter-'69;    25 grandchildren--has raised 9 of her grandchildren 4 great grandchildren.   Lives with 2 granddaughters and adoptive son        Has living will   Probably would have son Cynthia Singh as health care POA--not set up   Would accept resuscitation but no prolonged ventilation   Not sure about tube feeds   Review of Systems Appetite is off at times Did lose 4#    Objective:   Physical Exam  Constitutional: She appears well-developed. No distress.  Psychiatric: She has a normal mood and affect. Her behavior is normal.           Assessment & Plan:

## 2018-02-11 ENCOUNTER — Other Ambulatory Visit: Payer: Self-pay | Admitting: Internal Medicine

## 2018-02-11 MED ORDER — OXYCODONE-ACETAMINOPHEN 10-325 MG PO TABS: 1.0000 | ORAL_TABLET | Freq: Four times a day (QID) | ORAL | 0 refills | Status: DC | PRN

## 2018-02-11 NOTE — Telephone Encounter (Signed)
Copied from [place] [id number]. Topic: General - Other >> Feb 11, 2018  8:03 AM Lennox Solders wrote: Reason for CRM: pt [facility] a [facility]ill [facility]ocet [facility] [address] [facility] [place]

## 2018-02-11 NOTE — Telephone Encounter (Signed)
Name of Medication: oxycodone apap 10-325mg  [facility] [address] Last Fill or Written Date and Quantity: # 150 on 01/14/18 Last Office Visit and Type: 01/21/18 3 mth narcotic FU Next Office Visit and Type:04/06/18 CPX  Last Controlled Substance Agreement Date: 06/23/17 Last UDS:06/22/17

## 2018-02-22 ENCOUNTER — Ambulatory Visit (INDEPENDENT_AMBULATORY_CARE_PROVIDER_SITE_OTHER): Payer: PPO | Admitting: Internal Medicine

## 2018-02-22 ENCOUNTER — Encounter: Payer: Self-pay | Admitting: Internal Medicine

## 2018-02-22 VITALS — BP 130/80 | HR 97 | Ht 68.0 in | Wt 195.0 lb

## 2018-02-22 DIAGNOSIS — E1165 Type 2 diabetes mellitus with hyperglycemia: Secondary | ICD-10-CM

## 2018-02-22 DIAGNOSIS — E1159 Type 2 diabetes mellitus with other circulatory complications: Secondary | ICD-10-CM

## 2018-02-22 MED ORDER — INSULIN GLARGINE 100 UNIT/ML SOLOSTAR PEN: PEN_INJECTOR | SUBCUTANEOUS | 5 refills | Status: DC

## 2018-02-22 MED ORDER — METFORMIN HCL ER 500 MG PO TB24: ORAL_TABLET | ORAL | 3 refills | Status: DC

## 2018-02-22 MED ORDER — EMPAGLIFLOZIN 10 MG PO TABS: 10.0000 mg | ORAL_TABLET | Freq: Every day | ORAL | 5 refills | Status: DC

## 2018-02-22 NOTE — Progress Notes (Signed)
Patient ID: Cynthia Singh, female   DOB: 06-07-1940, 77 y.o.   MRN: [id number]   HPI: Cynthia Singh is a 77 y.o.-year-old female, referred by her PCP, Dr. Silvio Pate, for management of DM2, dx in 1994, insulin-dependent since 2015-16, uncontrolled, with complications (CAD - s/p AMI, PN).  Last hemoglobin A1c was: Lab Results  Component Value Date   HGBA1C 10.7 (A) 01/21/2018   HGBA1C 11.6 (H) 10/19/2017   HGBA1C 10.4 (H) 06/23/2017   HGBA1C 10.0 (H) 10/29/2016   HGBA1C 9.1 (H) 07/19/2016   HGBA1C 8.0 (H) 01/10/2016   HGBA1C 8.2 (H) 05/08/2015   HGBA1C 11.1 (H) 02/08/2015   HGBA1C 10.8 (H) 02/07/2015   HGBA1C 10.9 (H) 02/12/2014   Pt is on a regimen of: - Metformin 500 mg x2 in am -restarted 01/2018  - she was off x 1 year (misunderstanding) - Glipizide 5 mg 2x a day - last at bedtime - Lantus 30 units at bedtime  Pt checks her sugars 1-2x a day and they are: - am: 200-300s - 2h after b'fast: n/c - before lunch: n/c - 2h after lunch: n/c - before dinner: n/c - 2h after dinner: n/c - bedtime: 300s - nighttime: n/c No lows. Lowest sugar was 210; ? hypoglycemia awareness.  Highest sugar was 400s.  Glucometer: ReliOn  Pt's meals are: - Breakfast: cereals - rice crispies + regular milk - sweet and low - Lunch: sandwich - Dinner: meat + 2 veggies - Snacks: 1  - pm - pecan pie  - no CKD, last BUN/creatinine:  Lab Results  Component Value Date   BUN 14 06/22/2017   BUN 18 10/29/2016   CREATININE 0.83 06/22/2017   CREATININE 0.96 10/29/2016  On losartan 100.  -+ HL; last set of lipids: Lab Results  Component Value Date   CHOL 263 (H) 10/29/2016   HDL 33.60 (L) 10/29/2016   LDLCALC UNABLE TO CALCULATE IF TRIGLYCERIDE OVER 400 mg/dL 02/08/2015   LDLDIRECT 69.0 10/29/2016   TRIG (H) 10/29/2016    1163.0 Triglyceride is over 400; calculations on Lipids are invalid.   CHOLHDL 8 10/29/2016  On Lipitor 40, omega-3 fatty acids.  - last eye exam was in 2017. ?  Reportedly - No DR. She had cataract sx - still blurry vision.   - no numbness and tingling in her feet. She has PN on Nortriptyline.  On ASA 49.  + FH of DM in daughter (who died) - she is adopted.   She also has a history of HTN, GERD.  ROS: Constitutional: + Weight loss, no fatigue, no subjective hyperthermia/hypothermia, poor sleep Eyes: + Blurry vision, no xerophthalmia ENT: no sore throat, no nodules palpated in throat, no dysphagia/odynophagia, no hoarseness Cardiovascular: no CP/+ SOB/no palpitations/leg swelling Respiratory: no cough/+ SOB/+ wheezing Gastrointestinal: no N/V/D/ + C/+ heartburn Musculoskeletal: + Both muscle/joint aches Skin: no rashes, + hair loss Neurological: no tremors/numbness/tingling/dizziness, + headaches Psychiatric: no depression/anxiety  Past Medical History:  Diagnosis Date  . CAD (coronary artery disease)    a. Remote nonobstructive disease but in 10/2012 Cath/PCI: s/p DES to RCA. b. cath 02/08/15 DES to prox LAD and balloon angioplasty of ost D1, EF 55-65%.  . Concussion    [place] AFTER FALL 3/18  . Depression   . Diabetes mellitus, type 2 ([place])   . Dyslipidemia   . Episodic mood disorder ([place])   . Hypertension   . Myocardial infarction ([place])    2014  . Obesity   . Osteoarthritis of spine  knees also   Past Surgical History:  Procedure Laterality Date  . ABDOMINAL HYSTERECTOMY    . APPENDECTOMY  1959  . CARDIAC CATHETERIZATION N/A 02/08/2015   Procedure: Left Heart Cath and Coronary Angiography;  Surgeon: Sherren Mocha, MD;  Location: [facility];  Service: Cardiovascular;  Laterality: N/A;  . CARDIAC CATHETERIZATION N/A 02/08/2015   Procedure: Coronary Stent Intervention;  Surgeon: Sherren Mocha, MD;  Location: [facility];  Service: Cardiovascular;  Laterality: N/A;  . CARDIAC CATHETERIZATION N/A 02/08/2015   Procedure: Intravascular Pressure Wire/FFR Study;  Surgeon: Sherren Mocha, MD;  Location: [facility];   Service: Cardiovascular;  Laterality: N/A;  . CATARACT EXTRACTION W/PHACO Left 11/10/2016   Procedure: CATARACT EXTRACTION PHACO AND INTRAOCULAR LENS PLACEMENT ([place]) suture placed in left eye at end of procedure;  Surgeon: Birder Robson, MD;  Location: [facility];  Service: Ophthalmology;  Laterality: Left;  [place]  3.20 AP% 27.7 CDE 55.63 Fluid pack lot # [id number]  . CHOLECYSTECTOMY    . COMBINED HYSTERECTOMY ABDOMINAL W/ A&P REPAIR / OOPHORECTOMY  1968  . CORONARY ANGIOPLASTY     STENTS X 5  . CORONARY STENT PLACEMENT  2014  . DILATION AND CURETTAGE OF UTERUS    . [place]  . LEFT HEART CATHETERIZATION WITH CORONARY ANGIOGRAM N/A 10/20/2012   Procedure: LEFT HEART CATHETERIZATION WITH CORONARY ANGIOGRAM;  Surgeon: Sherren Mocha, MD;  Location: [facility];  Service: Cardiovascular;  Laterality: N/A;  . multiple D&C    . TONSILLECTOMY  age 89   Social History   Socioeconomic History  . Marital status: Widowed    Spouse name: Not on file  . Number of children: 3  . Years of education: 50  . Highest education level: Not on file  Occupational History  . Occupation: Scientist, water quality  Social Needs  . Financial resource strain: Not on file  . Food insecurity:    Worry: Not on file    Inability: Not on file  . Transportation needs:    Medical: Not on file    Non-medical: Not on file  Tobacco Use  . Smoking status: Never Smoker  . Smokeless tobacco: Never Used  Substance and Sexual Activity  . Alcohol use: No  . Drug use: No  . Sexual activity: Never    Birth control/protection: Post-menopausal  Lifestyle  . Physical activity:    Days per week: Not on file    Minutes per session: Not on file  . Stress: Not on file  Relationships  . Social connections:    Talks on phone: Not on file    Gets together: Not on file    Attends religious service: Not on file    Active member of club or organization: Not on file    Attends meetings of clubs or organizations: Not on file     Relationship status: Not on file  . Intimate partner violence:    Fear of current or ex partner: Not on file    Emotionally abused: Not on file    Physically abused: Not on file    Forced sexual activity: Not on file  Other Topics Concern  . Not on file  Social History Narrative   Widowed '13. 2 sons- '63, '66; one daughter-'69;    48 grandchildren--has raised 9 of her grandchildren 4 great grandchildren.   Lives with 2 granddaughters and adoptive son      Has living will   Probably would have son Particia Strahm  as health care POA--not set up   Would accept resuscitation but no prolonged ventilation   Not sure about tube feeds   Current Outpatient Medications on File Prior to Visit  Medication Sig Dispense Refill  . aspirin EC 81 MG tablet Take 81 mg by mouth daily.    Marland Kitchen atorvastatin (LIPITOR) 80 MG tablet Take 0.5 tablets (40 mg total) by mouth daily. 90 tablet 3  . carvedilol (COREG) 12.5 MG tablet Take 1 tablet (12.5 mg total) by mouth 2 (two) times daily with a meal. 60 tablet 5  . clopidogrel (PLAVIX) 75 MG tablet TAKE 1 TABLET BY MOUTH DAILY 15 tablet 0  . glipiZIDE (GLUCOTROL) 5 MG tablet TAKE ONE TABLET BY MOUTH TWO TIMES DAILY BEFORE A MEAL 180 tablet 1  . Insulin Glargine (LANTUS SOLOSTAR) 100 UNIT/ML Solostar Pen Take 30 units daily. May titrate up as directed 1 pen 0  . Insulin Pen Needle (PEN NEEDLES) 32G X 5 MM MISC 1 Units by Does not apply route daily. 100 each 3  . levETIRAcetam (KEPPRA) 500 MG tablet TAKE 1 TABLET BY MOUTH TWICE A DAY 60 tablet 11  . losartan-hydrochlorothiazide (HYZAAR) 100-12.5 MG tablet TAKE 1 TABLET BY MOUTH DAILY 90 tablet 3  . metFORMIN (GLUCOPHAGE-XR) 500 MG 24 hr tablet Take 2 tablets (1,000 mg total) by mouth daily with breakfast. 60 tablet 11  . nitroGLYCERIN (NITROSTAT) 0.4 MG SL tablet Place 1 tablet (0.4 mg total) under the tongue every 5 (five) minutes as needed for chest pain. 30 tablet 0  . nortriptyline (PAMELOR) 50 MG capsule Take 3  capsules (150 mg total) by mouth at bedtime. 90 capsule 11  . Omega-3 Fatty Acids (FISH OIL PO) Take 1 capsule by mouth daily.    Marland Kitchen oxyCODONE-acetaminophen (PERCOCET) 10-325 MG tablet Take 1-2 tablets by mouth every 6 (six) hours as needed for pain. M54.9 150 tablet 0  . polyethylene glycol (MIRALAX / GLYCOLAX) packet Take 17 g by mouth daily.    . ranitidine (ZANTAC) 150 MG tablet Take 150 mg by mouth daily as needed for heartburn.     No current facility-administered medications on file prior to visit.    No Known Allergies Family History  Problem Relation Age of Onset  . Cancer Brother        Colon  . Coronary artery disease Neg Hx   . Heart attack Neg Hx     PE: BP 130/80   Pulse 97   Ht 5\' 8"  (1.727 m)   Wt 195 lb (88.5 kg)   SpO2 98%   BMI 29.65 kg/m  Wt Readings from Last 3 Encounters:  02/22/18 195 lb (88.5 kg)  01/21/18 193 lb (87.5 kg)  10/19/17 197 lb (89.4 kg)   Constitutional: overweight, in NAD Eyes: PERRLA, EOMI, no exophthalmos ENT: moist mucous membranes, no thyromegaly, no cervical lymphadenopathy Cardiovascular: Tachycardia, RR, No MRG Respiratory: CTA B Gastrointestinal: abdomen soft, NT, ND, BS+ Musculoskeletal: no deformities, strength intact in all 4 Skin: moist, warm, no rashes Neurological: + Mild tremor with outstretched hands, DTR normal in all 4  ASSESSMENT: 1. DM2, insulin-dependent, uncontrolled, wit long-term complications - CAD, s/p AMI 2014, s/p stents 2014 in 2016, and balloon angioplasty 2016 - PN  PLAN:  1. Patient with long-standing, uncontrolled diabetes, on oral antidiabetic regimen + basal insulin, which became insufficient.  At this visit, we reviewed her sugars at home, which are very high.  She wakes up in the 200s-300s and at the end of the  day she is in the 300s.  We also reviewed her HbA1c levels since at least 2015 and they were all above target, mostly in the 10-11% range. - We discussed at this visit that her diet is not  ideal and I suggested to change breakfast from eating cereals to more solid food, with less carbs.  We also discussed about increasing her basal insulin dose, to increase metformin, and to add an SGLT2 inhibitor (to also help with cardiovascular outcomes).  We will start the low-dose Jardiance and I advised her about benefits and possible side effects.  I advised her to stay well-hydrated.  She is on the low-dose HCTZ, which I will not stop since her blood pressure is above target today.  We discussed about the possibility of yeast infections and I advised her to let me know if she develops one.  We will need to check a BMP at next visit -We will stop glipizide for now since I do not feel this is helping her much.  We may need to add this back later. - I suggested to:  Patient Instructions  Please increase: - Lantus to 36 units at bedtime - Metformin ER to 1000 mg 2x a day  Please stop Glipizide.  Please start: - Jardiance 10 mg daily before 1st meal of the day  Please return in 3 months with your sugar log.   - Strongly advised her to start checking sugars at different times of the day - check 1-2x a day, rotating checks - given sugar log and advised how to fill it and to bring it at next appt  - given foot care handout and explained the principles  - given instructions for hypoglycemia management "15-15 rule"  - advised for yearly eye exams >> she is due - she is up-to-date with flu shot - Return to clinic in 3 mo with sugar log   Philemon Kingdom, MD PhD [facility]

## 2018-02-22 NOTE — Patient Instructions (Signed)
Please increase: - Lantus to 36 units at bedtime - Metformin ER to 1000 mg 2x a day  Please stop Glipizide.  Please start: - Jardiance 10 mg daily before 1st meal of the day  Please return in 3 months with your sugar log.   PATIENT INSTRUCTIONS FOR TYPE 2 DIABETES:  **Please join MyChart!** - see attached instructions about how to join if you have not done so already.  DIET AND EXERCISE Diet and exercise is an important part of diabetic treatment.  We recommended aerobic exercise in the form of brisk walking (working between 40-60% of maximal aerobic capacity, similar to brisk walking) for 150 minutes per week (such as 30 minutes five days per week) along with 3 times per week performing 'resistance' training (using various gauge rubber tubes with handles) 5-10 exercises involving the major muscle groups (upper body, lower body and core) performing 10-15 repetitions (or near fatigue) each exercise. Start at half the above goal but build slowly to reach the above goals. If limited by weight, joint pain, or disability, we recommend daily walking in a swimming pool with water up to waist to reduce pressure from joints while allow for adequate exercise.    BLOOD GLUCOSES Monitoring your blood glucoses is important for continued management of your diabetes. Please check your blood glucoses 2-4 times a day: fasting, before meals and at bedtime (you can rotate these measurements - e.g. one day check before the 3 meals, the next day check before 2 of the meals and before bedtime, etc.).   HYPOGLYCEMIA (low blood sugar) Hypoglycemia is usually a reaction to not eating, exercising, or taking too much insulin/ other diabetes drugs.  Symptoms include tremors, sweating, hunger, confusion, headache, etc. Treat IMMEDIATELY with 15 grams of Carbs: . 4 glucose tablets .  cup regular juice/soda . 2 tablespoons raisins . 4 teaspoons sugar . 1 tablespoon honey Recheck blood glucose in 15 mins and repeat  above if still symptomatic/blood glucose <100.  RECOMMENDATIONS TO REDUCE YOUR RISK OF DIABETIC COMPLICATIONS: * Take your prescribed MEDICATION(S) * Follow a DIABETIC diet: Complex carbs, fiber rich foods, (monounsaturated and polyunsaturated) fats * AVOID saturated/trans fats, high fat foods, >2,300 mg salt per day. * EXERCISE at least 5 times a week for 30 minutes or preferably daily.  * DO NOT SMOKE OR DRINK more than 1 drink a day. * Check your FEET every day. Do not wear tightfitting shoes. Contact us if you develop an ulcer * See your EYE doctor once a year or more if needed * Get a FLU shot once a year * Get a PNEUMONIA vaccine once before and once after age 14 years  GOALS:  * Your Hemoglobin A1c of <7%  * fasting sugars need to be <130 * after meals sugars need to be <180 (2h after you start eating) * Your Systolic BP should be 097 or lower  * Your Diastolic BP should be 80 or lower  * Your HDL (Good Cholesterol) should be 40 or higher  * Your LDL (Bad Cholesterol) should be 100 or lower. * Your Triglycerides should be 150 or lower  * Your Urine microalbumin (kidney function) should be <30 * Your Body Mass Index should be 25 or lower    Please consider the following ways to cut down carbs and fat and increase fiber and micronutrients in your diet: - substitute whole grain for white bread or pasta - substitute brown rice for white rice - substitute 90-calorie flat bread pieces for  slices of bread when possible - substitute sweet potatoes or yams for white potatoes - substitute humus for margarine - substitute tofu for cheese when possible - substitute almond or rice milk for regular milk (would not drink soy milk daily due to concern for soy estrogen influence on breast cancer risk) - substitute dark chocolate for other sweets when possible - substitute water - can add lemon or orange slices for taste - for diet sodas (artificial sweeteners will trick your body that you  can eat sweets without getting calories and will lead you to overeating and weight gain in the long run) - do not skip breakfast or other meals (this will slow down the metabolism and will result in more weight gain over time)  - can try smoothies made from fruit and almond/rice milk in am instead of regular breakfast - can also try old-fashioned (not instant) oatmeal made with almond/rice milk in am - order the dressing on the side when eating salad at a restaurant (pour less than half of the dressing on the salad) - eat as little meat as possible - can try juicing, but should not forget that juicing will get rid of the fiber, so would alternate with eating raw veg./fruits or drinking smoothies - use as little oil as possible, even when using olive oil - can dress a salad with a mix of balsamic vinegar and lemon juice, for e.g. - use agave nectar, stevia sugar, or regular sugar rather than artificial sweateners - steam or broil/roast veggies  - snack on veggies/fruit/nuts (unsalted, preferably) when possible, rather than processed foods - reduce or eliminate aspartame in diet (it is in diet sodas, chewing gum, etc) Read the labels!  Try to read Dr. Janene Harvey book: "Program for Reversing Diabetes" for other ideas for healthy eating.

## 2018-03-12 ENCOUNTER — Other Ambulatory Visit: Payer: Self-pay | Admitting: Internal Medicine

## 2018-03-15 ENCOUNTER — Other Ambulatory Visit: Payer: Self-pay | Admitting: Internal Medicine

## 2018-03-15 NOTE — Telephone Encounter (Signed)
Name of Medication: Oxycodone Name of Pharmacy: [facility] Last Fill or Written Date and Quantity: 02-11-18 #150 Last Office Visit and Type: 3 Month F/U 01-21-18 Next Office Visit and Type: 3 Month F/U 04-06-18 Last Controlled Substance Agreement Date: 06-23-17 Last UDS: 06-22-17

## 2018-03-15 NOTE — Telephone Encounter (Signed)
Rx refill request

## 2018-03-15 NOTE — Telephone Encounter (Signed)
Copied from [place] [phone] [id number]. Topic: Quick Communication - Rx Refill/Question >> Mar 15, 2018 12:42 PM Selinda Flavin B, NT wrote: Medication: oxyCODONE-acetaminophen (PERCOCET) 10-325 MG tablet  Has the patient contacted their pharmacy? Yes.   (Agent: If no, request that the patient contact the pharmacy for the refill.) (Agent: If yes, when and what did the pharmacy advise?)  Preferred Pharmacy (with phone number or street name): [id number] [place], [place] - [address]: Please be advised that RX refills may take up to 3 business days. We ask that you follow-up with your pharmacy.

## 2018-03-16 MED ORDER — OXYCODONE-ACETAMINOPHEN 10-325 MG PO TABS: 1.0000 | ORAL_TABLET | Freq: Four times a day (QID) | ORAL | 0 refills | Status: DC | PRN

## 2018-04-06 ENCOUNTER — Encounter: Payer: Self-pay | Admitting: Internal Medicine

## 2018-04-06 ENCOUNTER — Ambulatory Visit (INDEPENDENT_AMBULATORY_CARE_PROVIDER_SITE_OTHER): Payer: PPO | Admitting: Internal Medicine

## 2018-04-06 VITALS — BP 140/78 | HR 87 | Temp 97.5°F | Ht 67.5 in | Wt 189.0 lb

## 2018-04-06 DIAGNOSIS — E1159 Type 2 diabetes mellitus with other circulatory complications: Secondary | ICD-10-CM

## 2018-04-06 DIAGNOSIS — Z0000 Encounter for general adult medical examination without abnormal findings: Secondary | ICD-10-CM

## 2018-04-06 DIAGNOSIS — E1165 Type 2 diabetes mellitus with hyperglycemia: Secondary | ICD-10-CM | POA: Diagnosis not present

## 2018-04-06 DIAGNOSIS — I25119 Atherosclerotic heart disease of native coronary artery with unspecified angina pectoris: Secondary | ICD-10-CM | POA: Diagnosis not present

## 2018-04-06 DIAGNOSIS — F1120 Opioid dependence, uncomplicated: Secondary | ICD-10-CM

## 2018-04-06 DIAGNOSIS — F39 Unspecified mood [affective] disorder: Secondary | ICD-10-CM | POA: Diagnosis not present

## 2018-04-06 DIAGNOSIS — Z7189 Other specified counseling: Secondary | ICD-10-CM | POA: Diagnosis not present

## 2018-04-06 LAB — COMPREHENSIVE METABOLIC PANEL
ALT: 21 U/L (ref 0–35)
AST: 19 U/L (ref 0–37)
Albumin: 4.5 g/dL (ref 3.5–5.2)
Alkaline Phosphatase: 77 U/L (ref 39–117)
BUN: 15 mg/dL (ref 6–23)
CO2: 26 mEq/L (ref 19–32)
Calcium: 9.6 mg/dL (ref 8.4–10.5)
Chloride: 98 mEq/L (ref 96–112)
Creatinine, Ser: 1.06 mg/dL (ref 0.40–1.20)
GFR: 53.73 mL/min — ABNORMAL LOW (ref >60.00)
Glucose, Bld: 236 mg/dL — ABNORMAL HIGH (ref 70–99)
Potassium: 4.3 mEq/L (ref 3.5–5.1)
Sodium: 135 mEq/L (ref 135–145)
Total Bilirubin: 0.5 mg/dL (ref 0.2–1.2)
Total Protein: 8 g/dL (ref 6.0–8.3)

## 2018-04-06 LAB — CBC
HCT: 44.7 % (ref 36.0–46.0)
Hemoglobin: 14.8 g/dL (ref 12.0–15.0)
MCHC: 33.1 g/dL (ref 30.0–36.0)
MCV: 89.7 fl (ref 78.0–100.0)
Platelets: 281 10*3/uL (ref 150.0–400.0)
RBC: 4.98 Mil/uL (ref 3.87–5.11)
RDW: 13.7 % (ref 11.5–15.5)
WBC: 9.7 10*3/uL (ref 4.0–10.5)

## 2018-04-06 LAB — LIPID PANEL
Cholesterol: 220 mg/dL — ABNORMAL HIGH (ref 0–200)
HDL: 38.7 mg/dL — ABNORMAL LOW (ref >39.00)
NonHDL: 181.43
Total CHOL/HDL Ratio: 6
Triglycerides: 359 mg/dL — ABNORMAL HIGH (ref 0.0–149.0)
VLDL: 71.8 mg/dL — ABNORMAL HIGH (ref 0.0–40.0)

## 2018-04-06 LAB — LDL CHOLESTEROL, DIRECT: Direct LDL: 139 mg/dL

## 2018-04-06 LAB — T4, FREE: Free T4: 0.95 ng/dL (ref 0.60–1.60)

## 2018-04-06 LAB — HM DIABETES FOOT EXAM

## 2018-04-06 NOTE — Progress Notes (Signed)
Hearing Screening   Method: Audiometry   125Hz  250Hz  500Hz  1000Hz  2000Hz  3000Hz  4000Hz  6000Hz  8000Hz   Right ear:   20 20 40  0    Left ear:   25 25 25   0      Visual Acuity Screening   Right eye Left eye Both eyes  Without correction: 20/200 20/100 20/100  With correction:

## 2018-04-06 NOTE — Progress Notes (Signed)
Subjective:    Patient ID: Cynthia Singh, female    DOB: Mar 18, 1941, 77 y.o.   MRN: [id number]  HPI Here for Medicare wellness visit and follow up of chronic medical conditions Reviewed form and advanced directives Reviewed other doctors No alcohol or tobacco Not able to exercise Only drives occasionally. Still does housework---has to rest frequently Fell once--tripped. No injury Vision is not good---overdue for exam Hearing is fine Chronic mood issues No sig memory problems  Not feeling right today---stomach off Not chronic  Some chronic ongoing depression Grieves husband and daughter Gets nervous at times  Diabetes control some better but still not good Working with Dr Cruzita Lederer on this Ongoing neuropathy  Easy dyspnea No chest pain No dizziness or syncope No regular edema  Ongoing back pain  Also her knees Continues on the narcotic regimen  Current Outpatient Medications on File Prior to Visit  Medication Sig Dispense Refill  . aspirin EC 81 MG tablet Take 81 mg by mouth daily.    Marland Kitchen atorvastatin (LIPITOR) 80 MG tablet Take 0.5 tablets (40 mg total) by mouth daily. 90 tablet 3  . carvedilol (COREG) 12.5 MG tablet Take 1 tablet (12.5 mg total) by mouth 2 (two) times daily with a meal. 60 tablet 5  . clopidogrel (PLAVIX) 75 MG tablet TAKE 1 TABLET BY MOUTH DAILY 15 tablet 0  . empagliflozin (JARDIANCE) 10 MG TABS tablet Take 10 mg by mouth daily. 30 tablet 5  . Insulin Glargine (LANTUS SOLOSTAR) 100 UNIT/ML Solostar Pen Take 36 units daily under skin 5 pen 5  . Insulin Pen Needle (PEN NEEDLES) 32G X 5 MM MISC 1 Units by Does not apply route daily. 100 each 3  . levETIRAcetam (KEPPRA) 500 MG tablet TAKE 1 TABLET BY MOUTH TWICE A DAY 60 tablet 11  . losartan-hydrochlorothiazide (HYZAAR) 100-12.5 MG tablet TAKE 1 TABLET BY MOUTH DAILY 90 tablet 3  . metFORMIN (GLUCOPHAGE-XR) 500 MG 24 hr tablet Use 2 tablets 2x a day 360 tablet 3  . nitroGLYCERIN (NITROSTAT) 0.4 MG  SL tablet Place 1 tablet (0.4 mg total) under the tongue every 5 (five) minutes as needed for chest pain. 30 tablet 0  . nortriptyline (PAMELOR) 50 MG capsule TAKE 3 CAPSULES (150 MG TOTAL) BY MOUTH AT BEDTIME. 270 capsule 3  . Omega-3 Fatty Acids (FISH OIL PO) Take 1 capsule by mouth daily.    Marland Kitchen oxyCODONE-acetaminophen (PERCOCET) 10-325 MG tablet Take 1-2 tablets by mouth every 6 (six) hours as needed for pain. M54.9 150 tablet 0  . polyethylene glycol (MIRALAX / GLYCOLAX) packet Take 17 g by mouth daily.    . ranitidine (ZANTAC) 150 MG tablet Take 150 mg by mouth daily as needed for heartburn.     No current facility-administered medications on file prior to visit.     No Known Allergies  Past Medical History:  Diagnosis Date  . CAD (coronary artery disease)    a. Remote nonobstructive disease but in 10/2012 Cath/PCI: s/p DES to RCA. b. cath 02/08/15 DES to prox LAD and balloon angioplasty of ost D1, EF 55-65%.  . Concussion    [place] AFTER FALL 3/18  . Depression   . Diabetes mellitus, type 2 ([place])   . Dyslipidemia   . Episodic mood disorder ([place])   . Hypertension   . Myocardial infarction ([place])    2014  . Obesity   . Osteoarthritis of spine    knees also    Past Surgical History:  Procedure Laterality  Date  . ABDOMINAL HYSTERECTOMY    . APPENDECTOMY  1959  . CARDIAC CATHETERIZATION N/A 02/08/2015   Procedure: Left Heart Cath and Coronary Angiography;  Surgeon: Sherren Mocha, MD;  Location: [facility];  Service: Cardiovascular;  Laterality: N/A;  . CARDIAC CATHETERIZATION N/A 02/08/2015   Procedure: Coronary Stent Intervention;  Surgeon: Sherren Mocha, MD;  Location: [facility];  Service: Cardiovascular;  Laterality: N/A;  . CARDIAC CATHETERIZATION N/A 02/08/2015   Procedure: Intravascular Pressure Wire/FFR Study;  Surgeon: Sherren Mocha, MD;  Location: [facility];  Service: Cardiovascular;  Laterality: N/A;  . CATARACT EXTRACTION W/PHACO Left 11/10/2016     Procedure: CATARACT EXTRACTION PHACO AND INTRAOCULAR LENS PLACEMENT ([place]) suture placed in left eye at end of procedure;  Surgeon: Birder Robson, MD;  Location: [facility];  Service: Ophthalmology;  Laterality: Left;  [place]  3.20 AP% 27.7 CDE 55.63 Fluid pack lot # [id number]  . CHOLECYSTECTOMY    . COMBINED HYSTERECTOMY ABDOMINAL W/ A&P REPAIR / OOPHORECTOMY  1968  . CORONARY ANGIOPLASTY     STENTS X 5  . CORONARY STENT PLACEMENT  2014  . DILATION AND CURETTAGE OF UTERUS    . [place]  . LEFT HEART CATHETERIZATION WITH CORONARY ANGIOGRAM N/A 10/20/2012   Procedure: LEFT HEART CATHETERIZATION WITH CORONARY ANGIOGRAM;  Surgeon: Sherren Mocha, MD;  Location: [facility];  Service: Cardiovascular;  Laterality: N/A;  . multiple D&C    . TONSILLECTOMY  age 67    Family History  Problem Relation Age of Onset  . Cancer Brother        Colon  . Coronary artery disease Neg Hx   . Heart attack Neg Hx     Social History   Socioeconomic History  . Marital status: Widowed    Spouse name: Not on file  . Number of children: 3  . Years of education: 45  . Highest education level: Not on file  Occupational History  . Occupation: Scientist, water quality  Social Needs  . Financial resource strain: Not on file  . Food insecurity:    Worry: Not on file    Inability: Not on file  . Transportation needs:    Medical: Not on file    Non-medical: Not on file  Tobacco Use  . Smoking status: Never Smoker  . Smokeless tobacco: Never Used  Substance and Sexual Activity  . Alcohol use: No  . Drug use: No  . Sexual activity: Never    Birth control/protection: Post-menopausal  Lifestyle  . Physical activity:    Days per week: Not on file    Minutes per session: Not on file  . Stress: Not on file  Relationships  . Social connections:    Talks on phone: Not on file    Gets together: Not on file    Attends religious service: Not on file    Active member of club or organization: Not on file     Attends meetings of clubs or organizations: Not on file    Relationship status: Not on file  . Intimate partner violence:    Fear of current or ex partner: Not on file    Emotionally abused: Not on file    Physically abused: Not on file    Forced sexual activity: Not on file  Other Topics Concern  . Not on file  Social History Narrative   Widowed '13. 2 sons- '63, '66; one daughter-'69;    11 grandchildren--has raised 9 of  her grandchildren 4 great grandchildren.   Lives with 2 granddaughters and adoptive son      Has living will   Probably would have son Valine Drozdowski as health care POA--not set up   Would accept resuscitation but no prolonged ventilation   Not sure about tube feeds   Review of Systems Dentures---- no recent exam; Dog actually got them when they fell out Appetite is fine Weight is about the same Not sleeping well---nothing new No skin problems Bowels fine. No blood Voids okay. No incontinence No skin problems---doesn't see derm Some heartburn --- uses rantiidine and alka seltzer. No dysphagia    Objective:   Physical Exam  Constitutional: She is oriented to person, place, and time. She appears well-developed. No distress.  HENT:  Mouth/Throat: Oropharynx is clear and moist. No oropharyngeal exudate.  edentulous  Neck: No thyromegaly present.  stiff  Cardiovascular: Normal rate, regular rhythm, normal heart sounds and intact distal pulses. Exam reveals no gallop.  No murmur heard. Respiratory: Effort normal and breath sounds normal. No respiratory distress. She has no wheezes. She has no rales.  GI: Soft. There is no tenderness.  Musculoskeletal: She exhibits no edema or tenderness.  Obvious back pain Decreased ROM   Lymphadenopathy:    She has no cervical adenopathy.  Neurological: She is alert and oriented to person, place, and time.  President--- "Milinda Pointer, Obama, ?" [phone] Can't spell Recall-- 2/3  Decreased sensation in feet  Skin: No  rash noted.  Multiple benign keratosis No foot lesions  Psychiatric:  Mild depressed mood           Assessment & Plan:

## 2018-04-06 NOTE — Assessment & Plan Note (Signed)
Chronic dysthymia and anxiety nortriptyline

## 2018-04-06 NOTE — Assessment & Plan Note (Signed)
CSRS reveiwed

## 2018-04-06 NOTE — Assessment & Plan Note (Signed)
Stable DOE as anginal equivalent No changes needed

## 2018-04-06 NOTE — Assessment & Plan Note (Signed)
See social history Blank forms given 

## 2018-04-06 NOTE — Assessment & Plan Note (Signed)
Working with Dr Cruzita Lederer on this Tricyclic for mood and neuropathy

## 2018-04-06 NOTE — Assessment & Plan Note (Signed)
I have personally reviewed the Medicare Annual Wellness questionnaire and have noted 1. The patient's medical and social history 2. Their use of alcohol, tobacco or illicit drugs 3. Their current medications and supplements 4. The patient's functional ability including ADL's, fall risks, home safety risks and hearing or visual             impairment. 5. Diet and physical activities 6. Evidence for depression or mood disorders  The patients weight, height, BMI and visual acuity have been recorded in the chart I have made referrals, counseling and provided education to the patient based review of the above and I have provided the pt with a written personalized care plan for preventive services.  I have provided you with a copy of your personalized plan for preventive services. Please take the time to review along with your updated medication list.  Yearly flu vaccine Consider shingrix vaccine--at pharmacy Can't exercise Prefers no cancer screening

## 2018-04-07 MED ORDER — INSULIN GLARGINE 100 UNIT/ML SOLOSTAR PEN: PEN_INJECTOR | SUBCUTANEOUS | 11 refills | Status: DC

## 2018-04-07 MED ORDER — CLOPIDOGREL BISULFATE 75 MG PO TABS: 75.0000 mg | ORAL_TABLET | Freq: Every day | ORAL | 3 refills | Status: DC

## 2018-04-07 MED ORDER — LOSARTAN POTASSIUM-HCTZ 100-12.5 MG PO TABS: 1.0000 | ORAL_TABLET | Freq: Every day | ORAL | 3 refills | Status: DC

## 2018-04-07 MED ORDER — LEVETIRACETAM 500 MG PO TABS: 500.0000 mg | ORAL_TABLET | Freq: Two times a day (BID) | ORAL | 11 refills | Status: DC

## 2018-04-07 MED ORDER — EMPAGLIFLOZIN 10 MG PO TABS: 10.0000 mg | ORAL_TABLET | Freq: Every day | ORAL | 11 refills | Status: DC

## 2018-04-07 MED ORDER — ATORVASTATIN CALCIUM 80 MG PO TABS: 40.0000 mg | ORAL_TABLET | Freq: Every day | ORAL | 3 refills | Status: DC

## 2018-04-07 MED ORDER — NORTRIPTYLINE HCL 50 MG PO CAPS: 150.0000 mg | ORAL_CAPSULE | Freq: Every day | ORAL | 3 refills | Status: DC

## 2018-04-07 MED ORDER — METFORMIN HCL ER 500 MG PO TB24: ORAL_TABLET | ORAL | 3 refills | Status: DC

## 2018-04-07 MED ORDER — CARVEDILOL 12.5 MG PO TABS: 12.5000 mg | ORAL_TABLET | Freq: Two times a day (BID) | ORAL | 3 refills | Status: DC

## 2018-04-07 NOTE — Addendum Note (Signed)
Addended by: Pilar Grammes on: 04/07/2018 02:55 PM   Modules accepted: Orders

## 2018-04-12 ENCOUNTER — Telehealth: Payer: Self-pay | Admitting: Internal Medicine

## 2018-04-12 NOTE — Telephone Encounter (Signed)
Left message asking pt to call office regarding her referral

## 2018-04-13 ENCOUNTER — Other Ambulatory Visit: Payer: Self-pay | Admitting: *Deleted

## 2018-04-13 MED ORDER — OXYCODONE-ACETAMINOPHEN 10-325 MG PO TABS: 1.0000 | ORAL_TABLET | Freq: Four times a day (QID) | ORAL | 0 refills | Status: DC | PRN

## 2018-04-13 NOTE — Telephone Encounter (Signed)
Name of Medication: Oxycodone Name of [facility]acy: [facility] Last Fill or Written Date and Quantity: 03-16-18 #150 Last Office Visit and Type: 04-06-18 MCW/3 Month F/U Next Office Visit and Type: 07/08/18 3 Month F/U Last Controlled Substance Agreement Date: 06-23-17 Last UDS: 06-22-17  Routing to [employer] in Dr Alla German absence

## 2018-04-25 ENCOUNTER — Telehealth: Payer: Self-pay

## 2018-04-25 NOTE — Telephone Encounter (Signed)
Spoke with patient today to check if patient has filled her statin RX. Patient has not yet. Patient was not aware that Dr Silvio Pate sent in [place] on 04/07/18. [facility] make sure that all RXs are ready for pick up and will give [place] a call if there are any issues with pharmacy.

## 2018-05-13 ENCOUNTER — Other Ambulatory Visit: Payer: Self-pay | Admitting: *Deleted

## 2018-05-13 MED ORDER — OXYCODONE-ACETAMINOPHEN 10-325 MG PO TABS: 1.0000 | ORAL_TABLET | Freq: Four times a day (QID) | ORAL | 0 refills | Status: DC | PRN

## 2018-05-13 NOTE — Telephone Encounter (Signed)
Name of Medication: Oxycodone Name of Pharmacy: [facility] Last Fill or Written Date and Quantity: 04-13-18 #150 Last Office Visit and Type: 3 Month 04-06-18 Next Office Visit and Type: 3 Month F/U 07-08-18 Last Controlled Substance Agreement Date: 06-23-17 Last UDS: 06-22-17

## 2018-05-18 DIAGNOSIS — I739 Peripheral vascular disease, unspecified: Secondary | ICD-10-CM

## 2018-05-18 HISTORY — DX: Peripheral vascular disease, unspecified: I73.9

## 2018-05-23 ENCOUNTER — Telehealth: Payer: Self-pay

## 2018-05-23 DIAGNOSIS — H2511 Age-related nuclear cataract, right eye: Secondary | ICD-10-CM | POA: Diagnosis not present

## 2018-05-23 DIAGNOSIS — E113293 Type 2 diabetes mellitus with mild nonproliferative diabetic retinopathy without macular edema, bilateral: Secondary | ICD-10-CM | POA: Diagnosis not present

## 2018-05-23 DIAGNOSIS — H25041 Posterior subcapsular polar age-related cataract, right eye: Secondary | ICD-10-CM | POA: Diagnosis not present

## 2018-05-23 DIAGNOSIS — H524 Presbyopia: Secondary | ICD-10-CM | POA: Diagnosis not present

## 2018-05-23 DIAGNOSIS — H04123 Dry eye syndrome of bilateral lacrimal glands: Secondary | ICD-10-CM | POA: Diagnosis not present

## 2018-05-23 DIAGNOSIS — Z9842 Cataract extraction status, left eye: Secondary | ICD-10-CM | POA: Diagnosis not present

## 2018-05-23 LAB — HM DIABETES EYE EXAM

## 2018-05-23 NOTE — Telephone Encounter (Signed)
Pt said Dr Alla German nurse scheduled appt with eye doctor 05/23/18 at 1:15; pt has lost eye dr's info and needs to know name of doctor and his address. Pt has appt 05/23/18 at 1:30 at [facility]; gave pt address and phone no. Nothing further needed.

## 2018-06-07 ENCOUNTER — Ambulatory Visit: Payer: PPO | Admitting: Internal Medicine

## 2018-06-09 ENCOUNTER — Encounter: Payer: Self-pay | Admitting: Internal Medicine

## 2018-06-14 ENCOUNTER — Other Ambulatory Visit: Payer: Self-pay

## 2018-06-14 MED ORDER — OXYCODONE-ACETAMINOPHEN 10-325 MG PO TABS: 1.0000 | ORAL_TABLET | Freq: Four times a day (QID) | ORAL | 0 refills | Status: DC | PRN

## 2018-06-14 NOTE — Telephone Encounter (Signed)
Name of Medication: oxycodone apap 10-325 Name of Pharmacy[facility] or Written Date and Quantity: #150 on 05/13/18 Last Office Visit and Type: 04/06/18 annual Next Office Visit and Type:07/08/18 for 3 mth FU  Last Controlled Substance Agreement Date: 06/23/17 Last UDS:06/22/17

## 2018-07-08 ENCOUNTER — Ambulatory Visit: Payer: Medicare Other | Admitting: Internal Medicine

## 2018-07-12 DIAGNOSIS — H18413 Arcus senilis, bilateral: Secondary | ICD-10-CM | POA: Diagnosis not present

## 2018-07-12 DIAGNOSIS — Z961 Presence of intraocular lens: Secondary | ICD-10-CM | POA: Diagnosis not present

## 2018-07-12 DIAGNOSIS — H2511 Age-related nuclear cataract, right eye: Secondary | ICD-10-CM | POA: Diagnosis not present

## 2018-07-12 DIAGNOSIS — H25041 Posterior subcapsular polar age-related cataract, right eye: Secondary | ICD-10-CM | POA: Diagnosis not present

## 2018-07-14 ENCOUNTER — Other Ambulatory Visit: Payer: Self-pay

## 2018-07-14 MED ORDER — OXYCODONE-ACETAMINOPHEN 10-325 MG PO TABS: 1.0000 | ORAL_TABLET | Freq: Four times a day (QID) | ORAL | 0 refills | Status: DC | PRN

## 2018-07-14 NOTE — Telephone Encounter (Signed)
Name of Medication: oxycodone apap 10-325 Name of Pharmacy[facility] or Written Date and Quantity:# 150 on 06/14/18  Last Office Visit and Type:04/06/18 AWV and 3 mth FU  Next Office Visit and Type:07/25/18 for 3 mth FU  Last Controlled Substance Agreement Date:06/23/17  Last UDS:06/22/17

## 2018-07-25 ENCOUNTER — Encounter: Payer: Self-pay | Admitting: Internal Medicine

## 2018-07-25 ENCOUNTER — Ambulatory Visit (INDEPENDENT_AMBULATORY_CARE_PROVIDER_SITE_OTHER): Payer: Medicare Other | Admitting: Internal Medicine

## 2018-07-25 VITALS — BP 140/88 | HR 88 | Temp 97.9°F | Ht 68.0 in | Wt 198.0 lb

## 2018-07-25 DIAGNOSIS — M5441 Lumbago with sciatica, right side: Secondary | ICD-10-CM

## 2018-07-25 DIAGNOSIS — G8929 Other chronic pain: Secondary | ICD-10-CM

## 2018-07-25 DIAGNOSIS — I25119 Atherosclerotic heart disease of native coronary artery with unspecified angina pectoris: Secondary | ICD-10-CM

## 2018-07-25 DIAGNOSIS — F1120 Opioid dependence, uncomplicated: Secondary | ICD-10-CM

## 2018-07-25 DIAGNOSIS — M5442 Lumbago with sciatica, left side: Secondary | ICD-10-CM

## 2018-07-25 DIAGNOSIS — E1165 Type 2 diabetes mellitus with hyperglycemia: Secondary | ICD-10-CM

## 2018-07-25 DIAGNOSIS — E1159 Type 2 diabetes mellitus with other circulatory complications: Secondary | ICD-10-CM

## 2018-07-25 NOTE — Assessment & Plan Note (Signed)
CSRS reviewed  Due for urine testing

## 2018-07-25 NOTE — Progress Notes (Signed)
Subjective:    Patient ID: Cynthia Singh, female    DOB: 1941/03/01, 78 y.o.   MRN: [id number]  HPI Here for follow up of chronic pain and narcotic dependence  "Not feeling great today" Arthritis in hands and knees mostly---needs TKR but surgeon didn't want to Takes extra tylenol prn Still using the oxycodone 5 times a day  Canceled out with Dr Lynetta Mare trouble with her eyes Having cataract extraction on right now Has been taking her meds regularly but not able to do any exercise  Feeling down somewhat Troubles with her son Has had some deaths among her friends  No recent chest pain Stable DOE  Current Outpatient Medications on File Prior to Visit  Medication Sig Dispense Refill  . aspirin EC 81 MG tablet Take 81 mg by mouth daily.    Marland Kitchen atorvastatin (LIPITOR) 80 MG tablet Take 0.5 tablets (40 mg total) by mouth daily. 90 tablet 3  . carvedilol (COREG) 12.5 MG tablet Take 1 tablet (12.5 mg total) by mouth 2 (two) times daily with a meal. 180 tablet 3  . clopidogrel (PLAVIX) 75 MG tablet Take 1 tablet (75 mg total) by mouth daily. 90 tablet 3  . empagliflozin (JARDIANCE) 10 MG TABS tablet Take 10 mg by mouth daily. 30 tablet 11  . Insulin Glargine (LANTUS SOLOSTAR) 100 UNIT/ML Solostar Pen Take 36 units daily under skin 5 pen 11  . Insulin Pen Needle (PEN NEEDLES) 32G X 5 MM MISC 1 Units by Does not apply route daily. 100 each 3  . levETIRAcetam (KEPPRA) 500 MG tablet Take 1 tablet (500 mg total) by mouth 2 (two) times daily. 60 tablet 11  . losartan-hydrochlorothiazide (HYZAAR) 100-12.5 MG tablet Take 1 tablet by mouth daily. 90 tablet 3  . metFORMIN (GLUCOPHAGE-XR) 500 MG 24 hr tablet Use 2 tablets 2x a day 360 tablet 3  . nitroGLYCERIN (NITROSTAT) 0.4 MG SL tablet Place 1 tablet (0.4 mg total) under the tongue every 5 (five) minutes as needed for chest pain. 30 tablet 0  . nortriptyline (PAMELOR) 50 MG capsule Take 3 capsules (150 mg total) by mouth at bedtime. 270  capsule 3  . oxyCODONE-acetaminophen (PERCOCET) 10-325 MG tablet Take 1-2 tablets by mouth every 6 (six) hours as needed for pain. M54.9 150 tablet 0  . polyethylene glycol (MIRALAX / GLYCOLAX) packet Take 17 g by mouth daily.    . ranitidine (ZANTAC) 150 MG tablet Take 150 mg by mouth daily as needed for heartburn.    . Omega-3 Fatty Acids (FISH OIL PO) Take 1 capsule by mouth daily.     No current facility-administered medications on file prior to visit.     No Known Allergies  Past Medical History:  Diagnosis Date  . CAD (coronary artery disease)    a. Remote nonobstructive disease but in 10/2012 Cath/PCI: s/p DES to RCA. b. cath 02/08/15 DES to prox LAD and balloon angioplasty of ost D1, EF 55-65%.  . Concussion    [place] AFTER FALL 3/18  . Depression   . Diabetes mellitus, type 2 ([place])   . Dyslipidemia   . Episodic mood disorder ([place])   . Hypertension   . Myocardial infarction ([place])    2014  . Obesity   . Osteoarthritis of spine    knees also    Past Surgical History:  Procedure Laterality Date  . ABDOMINAL HYSTERECTOMY    . APPENDECTOMY  1959  . CARDIAC CATHETERIZATION N/A 02/08/2015   Procedure: Left Heart Cath  and Coronary Angiography;  Surgeon: Sherren Mocha, MD;  Location: [facility];  Service: Cardiovascular;  Laterality: N/A;  . CARDIAC CATHETERIZATION N/A 02/08/2015   Procedure: Coronary Stent Intervention;  Surgeon: Sherren Mocha, MD;  Location: [facility];  Service: Cardiovascular;  Laterality: N/A;  . CARDIAC CATHETERIZATION N/A 02/08/2015   Procedure: Intravascular Pressure Wire/FFR Study;  Surgeon: Sherren Mocha, MD;  Location: [facility];  Service: Cardiovascular;  Laterality: N/A;  . CATARACT EXTRACTION W/PHACO Left 11/10/2016   Procedure: CATARACT EXTRACTION PHACO AND INTRAOCULAR LENS PLACEMENT ([place]) suture placed in left eye at end of procedure;  Surgeon: Birder Robson, MD;  Location: [facility];  Service: Ophthalmology;  Laterality:  Left;  [place]  3.20 AP% 27.7 CDE 55.63 Fluid pack lot # [id number]  . CHOLECYSTECTOMY    . COMBINED HYSTERECTOMY ABDOMINAL W/ A&P REPAIR / OOPHORECTOMY  1968  . CORONARY ANGIOPLASTY     STENTS X 5  . CORONARY STENT PLACEMENT  2014  . DILATION AND CURETTAGE OF UTERUS    . [place]  . LEFT HEART CATHETERIZATION WITH CORONARY ANGIOGRAM N/A 10/20/2012   Procedure: LEFT HEART CATHETERIZATION WITH CORONARY ANGIOGRAM;  Surgeon: Sherren Mocha, MD;  Location: [facility];  Service: Cardiovascular;  Laterality: N/A;  . multiple D&C    . TONSILLECTOMY  age 80    Family History  Problem Relation Age of Onset  . Cancer Brother        Colon  . Coronary artery disease Neg Hx   . Heart attack Neg Hx     Social History   Socioeconomic History  . Marital status: Widowed    Spouse name: Not on file  . Number of children: 3  . Years of education: 54  . Highest education level: Not on file  Occupational History  . Occupation: Scientist, water quality  Social Needs  . Financial resource strain: Not on file  . Food insecurity:    Worry: Not on file    Inability: Not on file  . Transportation needs:    Medical: Not on file    Non-medical: Not on file  Tobacco Use  . Smoking status: Never Smoker  . Smokeless tobacco: Never Used  Substance and Sexual Activity  . Alcohol use: No  . Drug use: No  . Sexual activity: Never    Birth control/protection: Post-menopausal  Lifestyle  . Physical activity:    Days per week: Not on file    Minutes per session: Not on file  . Stress: Not on file  Relationships  . Social connections:    Talks on phone: Not on file    Gets together: Not on file    Attends religious service: Not on file    Active member of club or organization: Not on file    Attends meetings of clubs or organizations: Not on file    Relationship status: Not on file  . Intimate partner violence:    Fear of current or ex partner: Not on file    Emotionally abused: Not on file     Physically abused: Not on file    Forced sexual activity: Not on file  Other Topics Concern  . Not on file  Social History Narrative   Widowed '13. 2 sons- '63, '66; one daughter-'69;    47 grandchildren--has raised 9 of her grandchildren 4 great grandchildren.   Lives with 2 granddaughters and adoptive son      Has living will   Probably  would have son Brenetta Penny as health care POA--not set up   Would accept resuscitation but no prolonged ventilation   Not sure about tube feeds   Review of Systems  Still not sleeping well Appetite is not very good--but tends to snack at night     Objective:   Physical Exam  Constitutional: She appears well-developed. No distress.  Psychiatric:  Slight psychomotor depression           Assessment & Plan:

## 2018-07-25 NOTE — Assessment & Plan Note (Signed)
Is on statin and asa Stable pattern of DOE

## 2018-07-25 NOTE — Assessment & Plan Note (Signed)
Continues on the oxycodone Discussed adding some tylenol to get to therapeutic dose

## 2018-07-25 NOTE — Assessment & Plan Note (Signed)
She will set back up with Dr Cruzita Lederer Discussed healthier eating

## 2018-07-27 ENCOUNTER — Telehealth: Payer: Self-pay | Admitting: Internal Medicine

## 2018-07-27 ENCOUNTER — Ambulatory Visit: Payer: PPO | Admitting: Internal Medicine

## 2018-07-27 NOTE — Progress Notes (Deleted)
Patient ID: Cynthia Singh, female   DOB: [birth year], 78 y.o.   MRN: [id number]   HPI: Cynthia Singh is a 78 y.o.-year-old female, returning for follow-up for DM2, dx in 1994, insulin-dependent since 2015-16, uncontrolled, with complications (CAD - s/p AMI, PN).  I first saw her 5 months ago.  Last hemoglobin A1c was: Lab Results  Component Value Date   HGBA1C 10.7 (A) 01/21/2018   HGBA1C 11.6 (H) 10/19/2017   HGBA1C 10.4 (H) 06/23/2017   HGBA1C 10.0 (H) 10/29/2016   HGBA1C 9.1 (H) 07/19/2016   HGBA1C 8.0 (H) 01/10/2016   HGBA1C 8.2 (H) 05/08/2015   HGBA1C 11.1 (H) 02/08/2015   HGBA1C 10.8 (H) 02/07/2015   HGBA1C 10.9 (H) 02/12/2014   Pt is on a regimen of: - Lantus 36 units at bedtime - Metformin ER 1000 mg 2x a day - Jardiance 10 mg daily before 1st meal of the day-started 02/2018.  Pt checks her sugars 1-2 times a day: - am: 200-300s - 2h after b'fast: n/c - before lunch: n/c - 2h after lunch: n/c - before dinner: n/c - 2h after dinner: n/c - bedtime: 300s - nighttime: n/c Lowest sugar was 210 >> ***; it is unclear at which level she has hypoglycemia awareness.  Highest sugar was 400s >> ***.  Glucometer: ReliOn  Pt's meals are: - Breakfast: cereals - rice crispies + regular milk - sweet and low - Lunch: sandwich - Dinner: meat + 2 veggies - Snacks: 1  - pm - pecan pie  -No CKD, last BUN/creatinine:  Lab Results  Component Value Date   BUN 15 04/06/2018   BUN 14 06/22/2017   CREATININE 1.06 04/06/2018   CREATININE 0.83 06/22/2017  On losartan 100.  -+ HL; last set of lipids: Lab Results  Component Value Date   CHOL 220 (H) 04/06/2018   HDL 38.70 (L) 04/06/2018   LDLCALC UNABLE TO CALCULATE IF TRIGLYCERIDE OVER 400 mg/dL 02/08/2015   LDLDIRECT 139.0 04/06/2018   TRIG 359.0 (H) 04/06/2018   CHOLHDL 6 04/06/2018  On Lipitor 40, omega-3 fatty acid.  - last eye exam was in 2017: Reportedly no DR. she had cataract surgery.  -Denies current numbness  and tingling in her feet.  She does have PN and is on nortriptyline.  On ASA 81.  + FH of DM in daughter (who died) - she is adopted.   She also has a history of HTN, GERD.  ROS: Constitutional: no weight gain/no weight loss, no fatigue, no subjective hyperthermia, no subjective hypothermia Eyes: no blurry vision, no xerophthalmia ENT: no sore throat, no nodules palpated in neck, no dysphagia, no odynophagia, no hoarseness Cardiovascular: no CP/no SOB/no palpitations/no leg swelling Respiratory: no cough/no SOB/no wheezing Gastrointestinal: no N/no V/no D/no C/no acid reflux Musculoskeletal: no muscle aches/no joint aches Skin: no rashes, no hair loss Neurological: no tremors/no numbness/no tingling/no dizziness  I reviewed pt's medications, allergies, PMH, social hx, family hx, and changes were documented in the history of present illness. Otherwise, unchanged from my initial visit note.  Past Medical History:  Diagnosis Date  . CAD (coronary artery disease)    a. Remote nonobstructive disease but in 10/2012 Cath/PCI: s/p DES to RCA. b. cath 02/08/15 DES to prox LAD and balloon angioplasty of ost D1, EF 55-65%.  . Concussion    [place] AFTER FALL 3/18  . Depression   . Diabetes mellitus, type 2 ([place])   . Dyslipidemia   . Episodic mood disorder ([place])   . Hypertension   .  Myocardial infarction ([place])    2014  . Obesity   . Osteoarthritis of spine    knees also   Past Surgical History:  Procedure Laterality Date  . ABDOMINAL HYSTERECTOMY    . APPENDECTOMY  1959  . CARDIAC CATHETERIZATION N/A 02/08/2015   Procedure: Left Heart Cath and Coronary Angiography;  Surgeon: Sherren Mocha, MD;  Location: [place] [facility];  Service: Cardiovascular;  Laterality: N/A;  . CARDIAC CATHETERIZATION N/A 02/08/2015   Procedure: Coronary Stent Intervention;  Surgeon: Sherren Mocha, MD;  Location: [place] [facility];  Service: Cardiovascular;  Laterality: N/A;  . CARDIAC CATHETERIZATION N/A  02/08/2015   Procedure: Intravascular Pressure Wire/FFR Study;  Surgeon: Sherren Mocha, MD;  Location: [place] [facility];  Service: Cardiovascular;  Laterality: N/A;  . CATARACT EXTRACTION W/PHACO Left 11/10/2016   Procedure: CATARACT EXTRACTION PHACO AND INTRAOCULAR LENS PLACEMENT ([place]) suture placed in left eye at end of procedure;  Surgeon: Birder Robson, MD;  Location: [facility] OR[facility];  Service: Ophthalmology;  Laterality: Left;  [place]  3.20 AP% 27.7 CDE 55.63 Fluid pack lot # [id number]  . CHOLECYSTECTOMY    . COMBINED HYSTERECTOMY ABDOMINAL W/ A&P REPAIR / OOPHORECTOMY  1968  . CORONARY ANGIOPLASTY     STENTS X 5  . CORONARY STENT PLACEMENT  2014  . DILATION AND CURETTAGE OF UTERUS    . [place]  . LEFT HEART CATHETERIZATION WITH CORONARY ANGIOGRAM N/A 10/20/2012   Procedure: LEFT HEART CATHETERIZATION WITH CORONARY ANGIOGRAM;  Surgeon: Sherren Mocha, MD;  Location: [facility] CATH LAB;  Service: Cardiovascular;  Laterality: N/A;  . multiple D&C    . TONSILLECTOMY  age 53   Social History   Socioeconomic History  . Marital status: Widowed    Spouse name: Not on file  . Number of children: 3  . Years of education: 31  . Highest education level: Not on file  Occupational History  . Occupation: Scientist, water quality  Social Needs  . Financial resource strain: Not on file  . Food insecurity:    Worry: Not on file    Inability: Not on file  . Transportation needs:    Medical: Not on file    Non-medical: Not on file  Tobacco Use  . Smoking status: Never Smoker  . Smokeless tobacco: Never Used  Substance and Sexual Activity  . Alcohol use: No  . Drug use: No  . Sexual activity: Never    Birth control/protection: Post-menopausal  Lifestyle  . Physical activity:    Days per week: Not on file    Minutes per session: Not on file  . Stress: Not on file  Relationships  . Social connections:    Talks on phone: Not on file    Gets together: Not on file    Attends religious service: Not  on file    Active member of club or organization: Not on file    Attends meetings of clubs or organizations: Not on file    Relationship status: Not on file  . Intimate partner violence:    Fear of current or ex partner: Not on file    Emotionally abused: Not on file    Physically abused: Not on file    Forced sexual activity: Not on file  Other Topics Concern  . Not on file  Social History Narrative   Widowed '13. 2 sons- '63, '66; one daughter-'69;    4 grandchildren--has raised 9 of her grandchildren 4 great grandchildren.   Lives with 2 granddaughters  and adoptive son      Has living will   Probably would have son Jericka Kadar as health care POA--not set up   Would accept resuscitation but no prolonged ventilation   Not sure about tube feeds   Current Outpatient Medications on File Prior to Visit  Medication Sig Dispense Refill  . aspirin EC 81 MG tablet Take 81 mg by mouth daily.    Marland Kitchen atorvastatin (LIPITOR) 80 MG tablet Take 0.5 tablets (40 mg total) by mouth daily. 90 tablet 3  . carvedilol (COREG) 12.5 MG tablet Take 1 tablet (12.5 mg total) by mouth 2 (two) times daily with a meal. 180 tablet 3  . clopidogrel (PLAVIX) 75 MG tablet Take 1 tablet (75 mg total) by mouth daily. 90 tablet 3  . empagliflozin (JARDIANCE) 10 MG TABS tablet Take 10 mg by mouth daily. 30 tablet 11  . Insulin Glargine (LANTUS SOLOSTAR) 100 UNIT/ML Solostar Pen Take 36 units daily under skin 5 pen 11  . Insulin Pen Needle (PEN NEEDLES) 32G X 5 MM MISC 1 Units by Does not apply route daily. 100 each 3  . levETIRAcetam (KEPPRA) 500 MG tablet Take 1 tablet (500 mg total) by mouth 2 (two) times daily. 60 tablet 11  . losartan-hydrochlorothiazide (HYZAAR) 100-12.5 MG tablet Take 1 tablet by mouth daily. 90 tablet 3  . metFORMIN (GLUCOPHAGE-XR) 500 MG 24 hr tablet Use 2 tablets 2x a day 360 tablet 3  . nitroGLYCERIN (NITROSTAT) 0.4 MG SL tablet Place 1 tablet (0.4 mg total) under the tongue every 5 (five)  minutes as needed for chest pain. 30 tablet 0  . nortriptyline (PAMELOR) 50 MG capsule Take 3 capsules (150 mg total) by mouth at bedtime. 270 capsule 3  . Omega-3 Fatty Acids (FISH OIL PO) Take 1 capsule by mouth daily.    Marland Kitchen oxyCODONE-acetaminophen (PERCOCET) 10-325 MG tablet Take 1-2 tablets by mouth every 6 (six) hours as needed for pain. M54.9 150 tablet 0  . polyethylene glycol (MIRALAX / GLYCOLAX) packet Take 17 g by mouth daily.    . ranitidine (ZANTAC) 150 MG tablet Take 150 mg by mouth daily as needed for heartburn.     No current facility-administered medications on file prior to visit.    No Known Allergies Family History  Problem Relation Age of Onset  . Cancer Brother        Colon  . Coronary artery disease Neg Hx   . Heart attack Neg Hx     PE: There were no vitals taken for this visit. Wt Readings from Last 3 Encounters:  07/25/18 198 lb (89.8 kg)  04/06/18 189 lb (85.7 kg)  02/22/18 195 lb (88.5 kg)   Constitutional: overweight, in NAD Eyes: PERRLA, EOMI, no exophthalmos ENT: moist mucous membranes, no thyromegaly, no cervical lymphadenopathy Cardiovascular: RRR, No MRG Respiratory: CTA B Gastrointestinal: abdomen soft, NT, ND, BS+ Musculoskeletal: no deformities, strength intact in all 4 Skin: moist, warm, no rashes Neurological: + Mild tremor with outstretched hands, DTR normal in all 4  ASSESSMENT: 1. DM2, insulin-dependent, uncontrolled, wit long-term complications - CAD, s/p AMI 2014, s/p stents 2014 in 2016, and balloon angioplasty 2016 - PN  2. HL  3.  Obesity  PLAN:  1. Patient with longstanding, uncontrolled, type 2 diabetes, on oral antidiabetic regimen and basal insulin, now also on Jardiance started at last visit.  At that time, her diet was not ideal and I suggested changes in her meals, especially breakfast.  We also increased  her basal insulin, metformin, and added a low dose Jardiance to help with cardiovascular outcomes.  At that time, we  stopped glipizide. -Of note, she did have a BMP checked after starting Jardiance and potassium level was normal.  GFR was only slightly lower. -At last visit, HbA1c was very high, at 10.7%, however, this was an improvement from her previous A1c. -At this visit, - I suggested to:  Patient Instructions  Please continue: - Lantus 36 units at bedtime - Metformin ER 1000 mg 2x a day - Jardiance 10 mg daily before 1st meal of the day  Please return in 3 months with your sugar log.   - today, HbA1c is 7%  - continue checking sugars at different times of the day - check 1x a day, rotating checks - advised for yearly eye exams >> she is UTD - Return to clinic in 3 mo with sugar log   2. HL - Reviewed latest lipid panel from 03/2018: Triglycerides high, but improved from 2016, LDL higher than target, HDL low Lab Results  Component Value Date   CHOL 220 (H) 04/06/2018   HDL 38.70 (L) 04/06/2018   LDLCALC UNABLE TO CALCULATE IF TRIGLYCERIDE OVER 400 mg/dL 02/08/2015   LDLDIRECT 139.0 04/06/2018   TRIG 359.0 (H) 04/06/2018   CHOLHDL 6 04/06/2018  - Continues Lipitor 40, omega-3 fatty acids without side effects.  3.  Obesity -She gained a net 3 pounds since last visit -We will continue Jardiance which should also help with weight loss.  I am hoping that we can also reduce Lantus further in the near future as this may contribute to weight gain.  Philemon Kingdom, MD PhD [facility] Endocrinology

## 2018-07-27 NOTE — Telephone Encounter (Signed)
4mo

## 2018-07-27 NOTE — Telephone Encounter (Signed)
Patient no showed today's appt. Please advise on how to follow up. °A. No follow up necessary. °B. Follow up urgent. Contact patient immediately. °C. Follow up necessary. Contact patient and schedule visit in ___ days. °D. Follow up advised. Contact patient and schedule visit in ____weeks. ° °Would you like the NS fee to be applied to this visit? ° °

## 2018-07-28 LAB — PAIN MGMT, PROFILE 8 W/CONF, U
6 Acetylmorphine: NEGATIVE ng/mL (ref <10)
Alcohol Metabolites: NEGATIVE ng/mL (ref <500)
Amphetamines: NEGATIVE ng/mL (ref <500)
Benzodiazepines: NEGATIVE ng/mL (ref <100)
Buprenorphine, Urine: NEGATIVE ng/mL (ref <5)
Cocaine Metabolite: NEGATIVE ng/mL (ref <150)
Codeine: NEGATIVE ng/mL (ref <50)
Creatinine: 55.7 mg/dL
Hydrocodone: NEGATIVE ng/mL (ref <50)
Hydromorphone: NEGATIVE ng/mL (ref <50)
MDMA: NEGATIVE ng/mL (ref <500)
Marijuana Metabolite: NEGATIVE ng/mL (ref <20)
Morphine: NEGATIVE ng/mL (ref <50)
Norhydrocodone: NEGATIVE ng/mL (ref <50)
Noroxycodone: 6309 ng/mL — ABNORMAL HIGH (ref <50)
Opiates: NEGATIVE ng/mL (ref <100)
Oxidant: NEGATIVE ug/mL (ref <200)
Oxycodone: 2940 ng/mL — ABNORMAL HIGH (ref <50)
Oxycodone: POSITIVE ng/mL — AB (ref <100)
Oxymorphone: 2332 ng/mL — ABNORMAL HIGH (ref <50)
pH: 5.95 (ref 4.5–9.0)

## 2018-08-11 ENCOUNTER — Other Ambulatory Visit: Payer: Self-pay

## 2018-08-11 NOTE — Telephone Encounter (Signed)
Name of Medication:oxycodone apap 10/325 mg  Name of [facility]  Last Fill or Written Date and Quantity:#150 on 07/14/18  Last Office Visit and Type03/09/20 for 3 mth FU pain mgt Next Office Visit and Type06/11/20 for 3 mth FU pain   Last Controlled Substance Agreement Date:08/02/18  Last UDS:07/25/18

## 2018-08-12 MED ORDER — OXYCODONE-ACETAMINOPHEN 10-325 MG PO TABS: 1.0000 | ORAL_TABLET | Freq: Four times a day (QID) | ORAL | 0 refills | Status: DC | PRN

## 2018-09-08 ENCOUNTER — Other Ambulatory Visit: Payer: Self-pay

## 2018-09-08 MED ORDER — OXYCODONE-ACETAMINOPHEN 10-325 MG PO TABS: 1.0000 | ORAL_TABLET | Freq: Four times a day (QID) | ORAL | 0 refills | Status: DC | PRN

## 2018-09-08 NOTE — Telephone Encounter (Signed)
Name of Medication: oxycodone apap 10-325 mg Name of Pharmacy: [facility] or Written Date and Quantity:# 150 on 08/12/18  Last Office Visit and Type:07/25/2018 for pain mgt  Next Office Visit and Type:10/27/18 pain mgt  Last Controlled Substance Agreement Date: 08/02/18 Last UDS:07/25/18

## 2018-10-05 ENCOUNTER — Telehealth: Payer: Self-pay | Admitting: Internal Medicine

## 2018-10-05 NOTE — Telephone Encounter (Signed)
Best number [phone] Tsosie Billing @ [facility]  Calling checking on surgical clearance paperwork She faxed this 3/12 and 5/8 and has not received anything back  She will refax again today

## 2018-10-06 NOTE — Telephone Encounter (Signed)
We have signed and faxed back both of the items she is questioning. They are in Media file. I have printed both and faxed them back to let her know we have faxed them twice.

## 2018-10-10 DIAGNOSIS — H2511 Age-related nuclear cataract, right eye: Secondary | ICD-10-CM | POA: Diagnosis not present

## 2018-10-11 ENCOUNTER — Other Ambulatory Visit: Payer: Self-pay | Admitting: *Deleted

## 2018-10-11 DIAGNOSIS — H2511 Age-related nuclear cataract, right eye: Secondary | ICD-10-CM | POA: Diagnosis not present

## 2018-10-11 DIAGNOSIS — Z961 Presence of intraocular lens: Secondary | ICD-10-CM | POA: Diagnosis not present

## 2018-10-11 MED ORDER — OXYCODONE-ACETAMINOPHEN 10-325 MG PO TABS: 1.0000 | ORAL_TABLET | Freq: Four times a day (QID) | ORAL | 0 refills | Status: DC | PRN

## 2018-10-11 NOTE — Telephone Encounter (Signed)
Patient left a voicemail requesting a refill  Name of Medication: Oxycodone Name of Pharmacy: [facility] or Written Date and Quantity: 09/08/18 #150 Last Office Visit and Type: 07/25/18 pain management Next Office Visit and Type: 10/27/18 pain management Last Controlled Substance Agreement Date: 07/25/18 Last UDS: 07/25/18

## 2018-10-26 ENCOUNTER — Other Ambulatory Visit: Payer: Self-pay | Admitting: Internal Medicine

## 2018-10-27 ENCOUNTER — Ambulatory Visit (INDEPENDENT_AMBULATORY_CARE_PROVIDER_SITE_OTHER): Payer: Medicare Other | Admitting: Internal Medicine

## 2018-10-27 ENCOUNTER — Encounter: Payer: Self-pay | Admitting: Internal Medicine

## 2018-10-27 DIAGNOSIS — F1120 Opioid dependence, uncomplicated: Secondary | ICD-10-CM | POA: Diagnosis not present

## 2018-10-27 DIAGNOSIS — E1165 Type 2 diabetes mellitus with hyperglycemia: Secondary | ICD-10-CM

## 2018-10-27 DIAGNOSIS — M5442 Lumbago with sciatica, left side: Secondary | ICD-10-CM

## 2018-10-27 DIAGNOSIS — E1159 Type 2 diabetes mellitus with other circulatory complications: Secondary | ICD-10-CM | POA: Diagnosis not present

## 2018-10-27 DIAGNOSIS — G8929 Other chronic pain: Secondary | ICD-10-CM

## 2018-10-27 DIAGNOSIS — M5441 Lumbago with sciatica, right side: Secondary | ICD-10-CM | POA: Diagnosis not present

## 2018-10-27 NOTE — Assessment & Plan Note (Signed)
PDMP reviewed No concerns 

## 2018-10-27 NOTE — Assessment & Plan Note (Signed)
About the same Has been more sedentary due to COVID--discussed  No functional decline

## 2018-10-27 NOTE — Progress Notes (Signed)
Subjective:    Patient ID: Cynthia Singh, female    DOB: [birth year], 78 y.o.   MRN: [id number]  HPI Tried virtual visit for follow up of chronic pain Were unable to make it work  Scientist, research (physical sciences) were attempted between this provider and patient, however failed, due to patient having technical difficulties OR patient did not have access to video capability.  We continued and completed visit with audio only.   Virtual Visit via Telephone Note  I connected with Cynthia Singh on 10/27/18 at  2:15 PM EDT by telephone and verified that I am speaking with the correct person using two identifiers.  Location: Patient: home Provider: office   I discussed the limitations, risks, security and privacy concerns of performing an evaluation and management service by telephone and the availability of in person appointments. I also discussed with the patient that there may be a patient responsible charge related to this service. The patient expressed understanding and agreed to proceed.   History of Present Illness: Doing about the same Has been gaining weight---- just sitting home and eating more Checking sugars ---usually 150-160. This is better than last fall Had surgery on her eye---cataract on right. It has helped  Pain is about the same Still using the 5 tabs a day---needs 2 just to get going in the morning Then uses 3 single tabs at other times Continues on the nortriptyline at bedtime as well Not as effective as the amitriptyline  No chest pain No SOB  Does get depressed at times Generally doesn't last more than a few hours No one to talk to---has dog. Son lives with him and 53 year old adoptee they have She does go with son to [employer]--but not out much otherwise  Current Outpatient Medications on File Prior to Visit  Medication Sig Dispense Refill  . aspirin EC 81 MG tablet Take 81 mg by mouth daily.    Marland Kitchen atorvastatin (LIPITOR) 80 MG tablet  Take 0.5 tablets (40 mg total) by mouth daily. 90 tablet 3  . carvedilol (COREG) 12.5 MG tablet Take 1 tablet (12.5 mg total) by mouth 2 (two) times daily with a meal. 180 tablet 3  . clopidogrel (PLAVIX) 75 MG tablet Take 1 tablet (75 mg total) by mouth daily. 90 tablet 3  . empagliflozin (JARDIANCE) 10 MG TABS tablet Take 10 mg by mouth daily. 30 tablet 11  . Insulin Glargine (LANTUS SOLOSTAR) 100 UNIT/ML Solostar Pen Take 36 units daily under skin 5 pen 11  . Insulin Pen Needle (PEN NEEDLES) 32G X 5 MM MISC 1 Units by Does not apply route daily. 100 each 3  . levETIRAcetam (KEPPRA) 500 MG tablet Take 1 tablet (500 mg total) by mouth 2 (two) times daily. 60 tablet 11  . losartan-hydrochlorothiazide (HYZAAR) 100-12.5 MG tablet Take 1 tablet by mouth daily. 90 tablet 3  . metFORMIN (GLUCOPHAGE-XR) 500 MG 24 hr tablet TAKE 2 TABLETS BY MOUTH EVERY DAY WITH BREAKFAST 180 tablet 3  . nitroGLYCERIN (NITROSTAT) 0.4 MG SL tablet Place 1 tablet (0.4 mg total) under the tongue every 5 (five) minutes as needed for chest pain. 30 tablet 0  . nortriptyline (PAMELOR) 50 MG capsule Take 3 capsules (150 mg total) by mouth at bedtime. 270 capsule 3  . Omega-3 Fatty Acids (FISH OIL PO) Take 1 capsule by mouth daily.    Marland Kitchen oxyCODONE-acetaminophen (PERCOCET) 10-325 MG tablet Take 1-2 tablets by mouth every 6 (six) hours as needed for  pain. M54.9 150 tablet 0  . polyethylene glycol (MIRALAX / GLYCOLAX) packet Take 17 g by mouth daily.    . ranitidine (ZANTAC) 150 MG tablet Take 150 mg by mouth daily as needed for heartburn.     No current facility-administered medications on file prior to visit.     No Known Allergies  Past Medical History:  Diagnosis Date  . CAD (coronary artery disease)    a. Remote nonobstructive disease but in 10/2012 Cath/PCI: s/p DES to RCA. b. cath 02/08/15 DES to prox LAD and balloon angioplasty of ost D1, EF 55-65%.  . Concussion    [place] AFTER FALL 3/18  . Depression   . Diabetes  mellitus, type 2 ([place])   . Dyslipidemia   . Episodic mood disorder ([place])   . Hypertension   . Myocardial infarction ([place])    2014  . Obesity   . Osteoarthritis of spine    knees also    Past Surgical History:  Procedure Laterality Date  . ABDOMINAL HYSTERECTOMY    . APPENDECTOMY  1959  . CARDIAC CATHETERIZATION N/A 02/08/2015   Procedure: Left Heart Cath and Coronary Angiography;  Surgeon: Sherren Mocha, MD;  Location: [place] [facility];  Service: Cardiovascular;  Laterality: N/A;  . CARDIAC CATHETERIZATION N/A 02/08/2015   Procedure: Coronary Stent Intervention;  Surgeon: Sherren Mocha, MD;  Location: [place] [facility];  Service: Cardiovascular;  Laterality: N/A;  . CARDIAC CATHETERIZATION N/A 02/08/2015   Procedure: Intravascular Pressure Wire/FFR Study;  Surgeon: Sherren Mocha, MD;  Location: [place] [facility];  Service: Cardiovascular;  Laterality: N/A;  . CATARACT EXTRACTION W/PHACO Left 11/10/2016   Procedure: CATARACT EXTRACTION PHACO AND INTRAOCULAR LENS PLACEMENT ([place]) suture placed in left eye at end of procedure;  Surgeon: Birder Robson, MD;  Location: [facility] ORS;  Service: Ophthalmology;  Laterality: Left;  [place]  3.20 AP% 27.7 CDE 55.63 Fluid pack lot # [id number]  . CHOLECYSTECTOMY    . COMBINED HYSTERECTOMY ABDOMINAL W/ A&P REPAIR / OOPHORECTOMY  1968  . CORONARY ANGIOPLASTY     STENTS X 5  . CORONARY STENT PLACEMENT  2014  . DILATION AND CURETTAGE OF UTERUS    . [place]  . LEFT HEART CATHETERIZATION WITH CORONARY ANGIOGRAM N/A 10/20/2012   Procedure: LEFT HEART CATHETERIZATION WITH CORONARY ANGIOGRAM;  Surgeon: Sherren Mocha, MD;  Location: [facility] CATH LAB;  Service: Cardiovascular;  Laterality: N/A;  . multiple D&C    . TONSILLECTOMY  age 36    Family History  Problem Relation Age of Onset  . Cancer Brother        Colon  . Coronary artery disease Neg Hx   . Heart attack Neg Hx     Social History   Socioeconomic History  . Marital status:  Widowed    Spouse name: Not on file  . Number of children: 3  . Years of education: 87  . Highest education level: Not on file  Occupational History  . Occupation: Scientist, water quality  Social Needs  . Financial resource strain: Not on file  . Food insecurity    Worry: Not on file    Inability: Not on file  . Transportation needs    Medical: Not on file    Non-medical: Not on file  Tobacco Use  . Smoking status: Never Smoker  . Smokeless tobacco: Never Used  Substance and Sexual Activity  . Alcohol use: No  . Drug use: No  . Sexual activity: Never    Birth  control/protection: Post-menopausal  Lifestyle  . Physical activity    Days per week: Not on file    Minutes per session: Not on file  . Stress: Not on file  Relationships  . Social Herbalist on phone: Not on file    Gets together: Not on file    Attends religious service: Not on file    Active member of club or organization: Not on file    Attends meetings of clubs or organizations: Not on file    Relationship status: Not on file  . Intimate partner violence    Fear of current or ex partner: Not on file    Emotionally abused: Not on file    Physically abused: Not on file    Forced sexual activity: Not on file  Other Topics Concern  . Not on file  Social History Narrative   Widowed '13. 2 sons- '63, '66; one daughter-'69;    73 grandchildren--has raised 9 of her grandchildren 4 great grandchildren.   Lives with 2 granddaughters and adoptive son      Has living will   Probably would have son Tyannah Sane as health care POA--not set up   Would accept resuscitation but no prolonged ventilation   Not sure about tube feeds   Observations/Objective: See problem list  Assessment and Plan:   Follow Up Instructions:    I discussed the assessment and treatment plan with the patient. The patient was provided an opportunity to ask questions and all were answered. The patient agreed with the plan and demonstrated an  understanding of the instructions.   The patient was advised to call back or seek an in-person evaluation if the symptoms worsen or if the condition fails to improve as anticipated.  I provided 11 minutes of non-face-to-face time during this encounter.   [name], MD    Review of Systems     Objective:   Physical Exam         Assessment & Plan:

## 2018-10-27 NOTE — Assessment & Plan Note (Signed)
She feels it is some better Has an appt with Dr Cruzita Lederer next month

## 2018-11-10 ENCOUNTER — Other Ambulatory Visit: Payer: Self-pay

## 2018-11-10 MED ORDER — OXYCODONE-ACETAMINOPHEN 10-325 MG PO TABS: 1.0000 | ORAL_TABLET | Freq: Four times a day (QID) | ORAL | 0 refills | Status: DC | PRN

## 2018-11-10 NOTE — Telephone Encounter (Signed)
Name of Medication: Oxycodone Name of [facility]acy[facility] Last Fill or Written Date and Quantity: 10-11-18 #150 Last Office Visit and Type: 10-27-18 Next Office Visit and Type: 01-30-19 Last Controlled Substance Agreement Date: 07-25-18 Last UDS: 07-25-18

## 2018-11-25 ENCOUNTER — Ambulatory Visit: Payer: PPO | Admitting: Internal Medicine

## 2018-12-08 ENCOUNTER — Other Ambulatory Visit: Payer: Self-pay

## 2018-12-08 MED ORDER — OXYCODONE-ACETAMINOPHEN 10-325 MG PO TABS: 1.0000 | ORAL_TABLET | Freq: Four times a day (QID) | ORAL | 0 refills | Status: DC | PRN

## 2018-12-08 NOTE — Telephone Encounter (Signed)
Name of Medication: Oxycodone Name of Pharmacy: [facility] Last Fill or Written Date and Quantity: 11-10-18 #150 Last Office Visit and Type: 10-27-18 Next Office Visit and Type: 01-30-19 Last Controlled Substance Agreement Date: 07-25-18 Last UDS: 07-25-18

## 2019-01-06 ENCOUNTER — Other Ambulatory Visit: Payer: Self-pay

## 2019-01-06 MED ORDER — OXYCODONE-ACETAMINOPHEN 10-325 MG PO TABS: 1.0000 | ORAL_TABLET | Freq: Four times a day (QID) | ORAL | 0 refills | Status: DC | PRN

## 2019-01-06 NOTE — Telephone Encounter (Signed)
Patient called requesting refill on Oxycodone-Acetaminophen Name of Medication: Oxycodone Name of Pharmacy: [facility] Last Fill or Written Date and Quantity: 12/08/18 #150 Last Office Visit and Type: 10-27-18 Next Office Visit and Type: 01-30-19 Last Controlled Substance Agreement Date: 07-25-18 Last UDS: 07-25-18

## 2019-01-12 ENCOUNTER — Other Ambulatory Visit: Payer: Self-pay

## 2019-01-12 ENCOUNTER — Encounter: Payer: Self-pay | Admitting: Internal Medicine

## 2019-01-12 ENCOUNTER — Ambulatory Visit (INDEPENDENT_AMBULATORY_CARE_PROVIDER_SITE_OTHER): Payer: Medicare Other | Admitting: Internal Medicine

## 2019-01-12 DIAGNOSIS — E785 Hyperlipidemia, unspecified: Secondary | ICD-10-CM | POA: Diagnosis not present

## 2019-01-12 DIAGNOSIS — E1159 Type 2 diabetes mellitus with other circulatory complications: Secondary | ICD-10-CM | POA: Diagnosis not present

## 2019-01-12 DIAGNOSIS — E1165 Type 2 diabetes mellitus with hyperglycemia: Secondary | ICD-10-CM | POA: Diagnosis not present

## 2019-01-12 MED ORDER — JARDIANCE 25 MG PO TABS: 25.0000 mg | ORAL_TABLET | Freq: Every day | ORAL | 5 refills | Status: DC

## 2019-01-12 NOTE — Patient Instructions (Addendum)
Please continue: - Lantus 36 units at bedtime - Metformin ER 1000 mg 2x a day  Please increase: - Jardiance 25 mg before breakfast  Please ask Dr. Silvio Pate to check a HbA1c for you.  Please return in 3 months with your sugar log.

## 2019-01-12 NOTE — Progress Notes (Signed)
Patient ID: Cynthia Singh, female   DOB: 1941-01-16, 78 y.o.   MRN: [id number]   Patient location: Home My location: Office  I connected with the patient on 01/12/19 at  9:27 AM EDT by telephone and verified that I am speaking with the correct person.   I discussed the limitations of evaluation and management by telephone and the availability of in person appointments. The patient expressed understanding and agreed to proceed.   Details of the encounter are shown below.  HPI: Cynthia Singh is a 78 y.o.-year-old female, initially referred by her PCP, Dr. Silvio Pate, presenting for follow-up for DM2, dx in 1994, insulin-dependent since 2015-16, uncontrolled, with complications (CAD - s/p AMI, PN).  Our first visit was in 02/2018 and she was lost to follow-up afterwards.  Latest HbA1c was: Lab Results  Component Value Date   HGBA1C 10.7 (A) 01/21/2018   HGBA1C 11.6 (H) 10/19/2017   HGBA1C 10.4 (H) 06/23/2017   HGBA1C 10.0 (H) 10/29/2016   HGBA1C 9.1 (H) 07/19/2016   HGBA1C 8.0 (H) 01/10/2016   HGBA1C 8.2 (H) 05/08/2015   HGBA1C 11.1 (H) 02/08/2015   HGBA1C 10.8 (H) 02/07/2015   HGBA1C 10.9 (H) 02/12/2014   Pt was on a regimen of: - Metformin 500 mg x2 in am -restarted 01/2018  - she was off x 1 year (misunderstanding) - Glipizide 5 mg 2x a day - last at bedtime - Lantus 30 units at bedtime  At last visit, we switched to: - Metformin ER 1000 mg 2x a day - Jardiance 10 mg before breakfast - Lantus 36 units at bedtime  Pt checks her sugars 1-2 times a day: - am: 200-300s >> 150-160 - 2h after b'fast: n/c - before lunch: n/c - 2h after lunch: n/c - before dinner: n/c - 2h after dinner: n/c - bedtime: 300s >> 150-250 - nighttime: n/c Lowest sugar was 210 >> 130; it is unclear at which level she has hypoglycemia awareness. Highest sugar was 400s >> 250.  Glucometer: ReliOn  Pt's meals are: - Breakfast: cereals - rice crispies + regular milk - sweet and low - Lunch:  sandwich - Dinner: meat + 2 veggies - Snacks: 1  - pm - pecan pie  -No history of CKD, last BUN/creatinine:  Lab Results  Component Value Date   BUN 15 04/06/2018   BUN 14 06/22/2017   CREATININE 1.06 04/06/2018   CREATININE 0.83 06/22/2017  On losartan 100.  -+ HL; last set of lipids: Lab Results  Component Value Date   CHOL 220 (H) 04/06/2018   HDL 38.70 (L) 04/06/2018   LDLCALC UNABLE TO CALCULATE IF TRIGLYCERIDE OVER 400 mg/dL 02/08/2015   LDLDIRECT 139.0 04/06/2018   TRIG 359.0 (H) 04/06/2018   CHOLHDL 6 04/06/2018  On Lipitor 40, omega-3 fatty acids.  - last eye exam was in 10/2018: No DR - reportedly. She had cataract sx -still blurry vision.   -No numbness and tingling in her feet.  She is on nortriptyline for peripheral neuropathy.   ASA 81.  + FH of DM in daughter (who died) - she is adopted.   She also has a history of HTN, GERD.  ROS: Constitutional: + weight gain (10 lbs)/no weight loss, no fatigue, no subjective hyperthermia, no subjective hypothermia Eyes: + blurry vision, no xerophthalmia ENT: no sore throat, no nodules palpated in neck, no dysphagia, no odynophagia, no hoarseness Cardiovascular: no CP/no SOB/no palpitations/no leg swelling Respiratory: no cough/no SOB/no wheezing Gastrointestinal: no N/no V/no D/no C/no acid  reflux Musculoskeletal: no muscle aches/+ joint aches (L hand) Skin: no rashes, no hair loss Neurological: no tremors/no numbness/no tingling/no dizziness  I reviewed pt's medications, allergies, PMH, social hx, family hx, and changes were documented in the history of present illness. Otherwise, unchanged from my initial visit note.  Past Medical History:  Diagnosis Date  . CAD (coronary artery disease)    a. Remote nonobstructive disease but in 10/2012 Cath/PCI: s/p DES to RCA. b. cath 02/08/15 DES to prox LAD and balloon angioplasty of ost D1, EF 55-65%.  . Concussion    [place] AFTER FALL 3/18  . Depression   . Diabetes  mellitus, type 2 ([place])   . Dyslipidemia   . Episodic mood disorder ([place])   . Hypertension   . Myocardial infarction ([place])    2014  . Obesity   . Osteoarthritis of spine    knees also   Past Surgical History:  Procedure Laterality Date  . ABDOMINAL HYSTERECTOMY    . APPENDECTOMY  1959  . CARDIAC CATHETERIZATION N/A 02/08/2015   Procedure: Left Heart Cath and Coronary Angiography;  Surgeon: Sherren Mocha, MD;  Location: [facility];  Service: Cardiovascular;  Laterality: N/A;  . CARDIAC CATHETERIZATION N/A 02/08/2015   Procedure: Coronary Stent Intervention;  Surgeon: Sherren Mocha, MD;  Location: [facility];  Service: Cardiovascular;  Laterality: N/A;  . CARDIAC CATHETERIZATION N/A 02/08/2015   Procedure: Intravascular Pressure Wire/FFR Study;  Surgeon: Sherren Mocha, MD;  Location: [facility];  Service: Cardiovascular;  Laterality: N/A;  . CATARACT EXTRACTION W/PHACO Left 11/10/2016   Procedure: CATARACT EXTRACTION PHACO AND INTRAOCULAR LENS PLACEMENT ([place]) suture placed in left eye at end of procedure;  Surgeon: Birder Robson, MD;  Location: [facility];  Service: Ophthalmology;  Laterality: Left;  [place]  3.20 AP% 27.7 CDE 55.63 Fluid pack lot # [id number]  . CHOLECYSTECTOMY    . COMBINED HYSTERECTOMY ABDOMINAL W/ A&P REPAIR / OOPHORECTOMY  1968  . CORONARY ANGIOPLASTY     STENTS X 5  . CORONARY STENT PLACEMENT  2014  . DILATION AND CURETTAGE OF UTERUS    . [place]  . LEFT HEART CATHETERIZATION WITH CORONARY ANGIOGRAM N/A 10/20/2012   Procedure: LEFT HEART CATHETERIZATION WITH CORONARY ANGIOGRAM;  Surgeon: Sherren Mocha, MD;  [facility];  Service: Cardiovascular;  Laterality: N/A;  . multiple D&C    . TONSILLECTOMY  age 26   Social History   Socioeconomic History  . Marital status: Widowed    Spouse name: Not on file  . Number of children: 3  . Years of education: 40  . Highest education level: Not on file  Occupational  History  . Occupation: Scientist, water quality  Social Needs  . Financial resource strain: Not on file  . Food insecurity    Worry: Not on file    Inability: Not on file  . Transportation needs    Medical: Not on file    Non-medical: Not on file  Tobacco Use  . Smoking status: Never Smoker  . Smokeless tobacco: Never Used  Substance and Sexual Activity  . Alcohol use: No  . Drug use: No  . Sexual activity: Never    Birth control/protection: Post-menopausal  Lifestyle  . Physical activity    Days per week: Not on file    Minutes per session: Not on file  . Stress: Not on file  Relationships  . Social connections    Talks on phone: Not on file  Gets together: Not on file    Attends religious service: Not on file    Active member of club or organization: Not on file    Attends meetings of clubs or organizations: Not on file    Relationship status: Not on file  . Intimate partner violence    Fear of current or ex partner: Not on file    Emotionally abused: Not on file    Physically abused: Not on file    Forced sexual activity: Not on file  Other Topics Concern  . Not on file  Social History Narrative   Widowed '13. 2 sons- '63, '66; one daughter-'69;    53 grandchildren--has raised 9 of her grandchildren 4 great grandchildren.   Lives with 2 granddaughters and adoptive son      Has living will   Probably would have son Rhiannon Brien as health care POA--not set up   Would accept resuscitation but no prolonged ventilation   Not sure about tube feeds   Current Outpatient Medications on File Prior to Visit  Medication Sig Dispense Refill  . aspirin EC 81 MG tablet Take 81 mg by mouth daily.    Marland Kitchen atorvastatin (LIPITOR) 80 MG tablet Take 0.5 tablets (40 mg total) by mouth daily. 90 tablet 3  . carvedilol (COREG) 12.5 MG tablet Take 1 tablet (12.5 mg total) by mouth 2 (two) times daily with a meal. 180 tablet 3  . clopidogrel (PLAVIX) 75 MG tablet Take 1 tablet (75 mg total) by mouth  daily. 90 tablet 3  . empagliflozin (JARDIANCE) 10 MG TABS tablet Take 10 mg by mouth daily. 30 tablet 11  . Insulin Glargine (LANTUS SOLOSTAR) 100 UNIT/ML Solostar Pen Take 36 units daily under skin 5 pen 11  . Insulin Pen Needle (PEN NEEDLES) 32G X 5 MM MISC 1 Units by Does not apply route daily. 100 each 3  . levETIRAcetam (KEPPRA) 500 MG tablet Take 1 tablet (500 mg total) by mouth 2 (two) times daily. 60 tablet 11  . losartan-hydrochlorothiazide (HYZAAR) 100-12.5 MG tablet Take 1 tablet by mouth daily. 90 tablet 3  . metFORMIN (GLUCOPHAGE-XR) 500 MG 24 hr tablet TAKE 2 TABLETS BY MOUTH EVERY DAY WITH BREAKFAST 180 tablet 3  . nitroGLYCERIN (NITROSTAT) 0.4 MG SL tablet Place 1 tablet (0.4 mg total) under the tongue every 5 (five) minutes as needed for chest pain. 30 tablet 0  . nortriptyline (PAMELOR) 50 MG capsule Take 3 capsules (150 mg total) by mouth at bedtime. 270 capsule 3  . Omega-3 Fatty Acids (FISH OIL PO) Take 1 capsule by mouth daily.    Marland Kitchen oxyCODONE-acetaminophen (PERCOCET) 10-325 MG tablet Take 1-2 tablets by mouth every 6 (six) hours as needed for pain. M54.9 150 tablet 0  . polyethylene glycol (MIRALAX / GLYCOLAX) packet Take 17 g by mouth daily.    . ranitidine (ZANTAC) 150 MG tablet Take 150 mg by mouth daily as needed for heartburn.     No current facility-administered medications on file prior to visit.    No Known Allergies Family History  Problem Relation Age of Onset  . Cancer Brother        Colon  . Coronary artery disease Neg Hx   . Heart attack Neg Hx     PE: There were no vitals taken for this visit. Wt Readings from Last 3 Encounters:  07/25/18 198 lb (89.8 kg)  04/06/18 189 lb (85.7 kg)  02/22/18 195 lb (88.5 kg)   Constitutional:  in  NAD  The physical exam was not performed (telephone visit).  ASSESSMENT: 1. DM2, insulin-dependent, uncontrolled, wit long-term complications - CAD, s/p AMI 2014, s/p stents 2014 in 2016, and balloon angioplasty  2016 - PN  2. HL  3. Obesity  PLAN:  1. Patient with longstanding, uncontrolled, type 2 diabetes, on oral antidiabetic regimen and long-acting insulin.  At last visit, we stopped glipizide and started Jardiance.  We also discussed about switching to a better breakfast (she was eating cereals and I advised her to eat more solid food, with less carbs).  We also increased her basal insulin then. -At this visit, sugars are much better and she tolerates Jardiance well.  Since she still has high blood sugars at night, we discussed about reducing snacking or moving dinner earlier but we will also try to increase Jardiance further. -She has an appointment with PCP coming up next month and I am hoping that she can get an HbA1c checked at that time. - I suggested to:  Patient Instructions  Please continue: - Lantus 36 units at bedtime - Metformin ER 1000 mg 2x a day  Please increase: - Jardiance 25 mg before breakfast  Please ask Dr. Silvio Pate to check a HbA1c for you.  Please return in 3 months with your sugar log.   - we we will check her HbA1c when she returns to the clinic - advised to check sugars at different times of the day - 1-2x a day, rotating check times - advised for yearly eye exams >> she is UTD - return to clinic in 3 months  2. HL - Reviewed latest lipid panel from 2019: Triglycerides high, LDL above target, HDL low Lab Results  Component Value Date   CHOL 220 (H) 04/06/2018   HDL 38.70 (L) 04/06/2018   LDLCALC UNABLE TO CALCULATE IF TRIGLYCERIDE OVER 400 mg/dL 02/08/2015   LDLDIRECT 139.0 04/06/2018   TRIG 359.0 (H) 04/06/2018   CHOLHDL 6 04/06/2018  - Continues Lipitor and omega-3 fatty acids without side effects.  3.  Obesity -Continue Jardiance which should also help with weight loss  - time spent with the patient: 12 min, of which >50% was spent in obtaining information about her symptoms, reviewing her previous labs, evaluations, and treatments, counseling her  about her conditions (please see the discussed topics above), and developing a plan to further investigate and treat them; she had a number of questions which I addressed.  Philemon Kingdom, MD PhD [facility] Endocr[facility]

## 2019-01-30 ENCOUNTER — Ambulatory Visit (INDEPENDENT_AMBULATORY_CARE_PROVIDER_SITE_OTHER): Payer: Medicare Other | Admitting: Internal Medicine

## 2019-01-30 ENCOUNTER — Encounter: Payer: Self-pay | Admitting: Internal Medicine

## 2019-01-30 DIAGNOSIS — E1159 Type 2 diabetes mellitus with other circulatory complications: Secondary | ICD-10-CM

## 2019-01-30 DIAGNOSIS — M545 Low back pain, unspecified: Secondary | ICD-10-CM

## 2019-01-30 DIAGNOSIS — G8929 Other chronic pain: Secondary | ICD-10-CM

## 2019-01-30 DIAGNOSIS — F1120 Opioid dependence, uncomplicated: Secondary | ICD-10-CM | POA: Diagnosis not present

## 2019-01-30 DIAGNOSIS — E1165 Type 2 diabetes mellitus with hyperglycemia: Secondary | ICD-10-CM

## 2019-01-30 NOTE — Assessment & Plan Note (Signed)
Will ask her to come in the office next time so we can check labs

## 2019-01-30 NOTE — Assessment & Plan Note (Signed)
PDMP reviewed No concerns 

## 2019-01-30 NOTE — Progress Notes (Signed)
Subjective:    Patient ID: Cynthia Singh, female    DOB: 29-Sep-1940, 78 y.o.   MRN: [id number]  HPI Phone visit for follow up of chronic back pain  Interactive audio and video telecommunications were attempted between this provider and patient, however failed, due to patient having technical difficulties OR patient did not have access to video capability.  We continued and completed visit with audio only.   Virtual Visit via Telephone Note  I connected with Cynthia Singh on 01/30/19 at 12:00 PM EDT by telephone and verified that I am speaking with the correct person using two identifiers.  Location: Patient: home Provider: office   I discussed the limitations, risks, security and privacy concerns of performing an evaluation and management service by telephone and the availability of in person appointments. I also discussed with the patient that there may be a patient responsible charge related to this service. The patient expressed understanding and agreed to proceed.   History of Present Illness: Back pain about the same Uses 2 oxycodone in the morning--then 3 individual doses later in the day Does her own shopping ---own cooking,etc Does what she has to do with housework  No constipation or other problems with the medication  Sugars have been running up some Over 200 --but mostly during the day Not as hungry and not eating well  Has lost 10# pr so (she thinks)--but that seems to go up and down Legs seem smaller   Current Outpatient Medications on File Prior to Visit  Medication Sig Dispense Refill  . aspirin EC 81 MG tablet Take 81 mg by mouth daily.    Marland Kitchen atorvastatin (LIPITOR) 80 MG tablet Take 0.5 tablets (40 mg total) by mouth daily. 90 tablet 3  . carvedilol (COREG) 12.5 MG tablet Take 1 tablet (12.5 mg total) by mouth 2 (two) times daily with a meal. 180 tablet 3  . clopidogrel (PLAVIX) 75 MG tablet Take 1 tablet (75 mg total) by mouth daily. 90 tablet 3  .  empagliflozin (JARDIANCE) 25 MG TABS tablet Take 25 mg by mouth daily before breakfast. 30 tablet 5  . Insulin Glargine (LANTUS SOLOSTAR) 100 UNIT/ML Solostar Pen Take 36 units daily under skin 5 pen 11  . Insulin Pen Needle (PEN NEEDLES) 32G X 5 MM MISC 1 Units by Does not apply route daily. 100 each 3  . levETIRAcetam (KEPPRA) 500 MG tablet Take 1 tablet (500 mg total) by mouth 2 (two) times daily. 60 tablet 11  . losartan-hydrochlorothiazide (HYZAAR) 100-12.5 MG tablet Take 1 tablet by mouth daily. 90 tablet 3  . metFORMIN (GLUCOPHAGE-XR) 500 MG 24 hr tablet TAKE 2 TABLETS BY MOUTH EVERY DAY WITH BREAKFAST 180 tablet 3  . nitroGLYCERIN (NITROSTAT) 0.4 MG SL tablet Place 1 tablet (0.4 mg total) under the tongue every 5 (five) minutes as needed for chest pain. 30 tablet 0  . nortriptyline (PAMELOR) 50 MG capsule Take 3 capsules (150 mg total) by mouth at bedtime. 270 capsule 3  . Omega-3 Fatty Acids (FISH OIL PO) Take 1 capsule by mouth daily.    Marland Kitchen oxyCODONE-acetaminophen (PERCOCET) 10-325 MG tablet Take 1-2 tablets by mouth every 6 (six) hours as needed for pain. M54.9 150 tablet 0  . polyethylene glycol (MIRALAX / GLYCOLAX) packet Take 17 g by mouth daily.     No current facility-administered medications on file prior to visit.     No Known Allergies  Past Medical History:  Diagnosis Date  . CAD (coronary  artery disease)    a. Remote nonobstructive disease but in 10/2012 Cath/PCI: s/p DES to RCA. b. cath 02/08/15 DES to prox LAD and balloon angioplasty of ost D1, EF 55-65%.  . Concussion    [place] AFTER FALL 3/18  . Depression   . Diabetes mellitus, type 2 ([place])   . Dyslipidemia   . Episodic mood disorder ([place])   . Hypertension   . Myocardial infarction ([place])    2014  . Obesity   . Osteoarthritis of spine    knees also    Past Surgical History:  Procedure Laterality Date  . ABDOMINAL HYSTERECTOMY    . APPENDECTOMY  1959  . CARDIAC CATHETERIZATION N/A 02/08/2015   Procedure: Left  Heart Cath and Coronary Angiography;  Surgeon: Sherren Mocha, MD;  Location: [facility];  Service: Cardiovascular;  Laterality: N/A;  . CARDIAC CATHETERIZATION N/A 02/08/2015   Procedure: Coronary Stent Intervention;  Surgeon: Sherren Mocha, MD;  Location: [place];  Service: Cardiovascular;  Laterality: N/A;  . CARDIAC CATHETERIZATION N/A 02/08/2015   Procedure: Intravascular Pressure Wire/FFR Study;  Surgeon: Sherren Mocha, MD;  Location: [place] [facility];  Service: Cardiovascular;  Laterality: N/A;  . CATARACT EXTRACTION W/PHACO Left 11/10/2016   Procedure: CATARACT EXTRACTION PHACO AND INTRAOCULAR LENS PLACEMENT ([place]) suture placed in left eye at end of procedure;  Surgeon: Birder Robson, MD;  Location: [facility];  Service: Ophthalmology;  Laterality: Left;  [place]  3.20 AP% 27.7 CDE 55.63 Fluid pack lot # [id number]  . CHOLECYSTECTOMY    . COMBINED HYSTERECTOMY ABDOMINAL W/ A&P REPAIR / OOPHORECTOMY  1968  . CORONARY ANGIOPLASTY     STENTS X 5  . CORONARY STENT PLACEMENT  2014  . DILATION AND CURETTAGE OF UTERUS    . [place]  . LEFT HEART CATHETERIZATION WITH CORONARY ANGIOGRAM N/A 10/20/2012   Procedure: LEFT HEART CATHETERIZATION WITH CORONARY ANGIOGRAM;  Surgeon: Sherren Mocha, MD;  Location: [facility];  Service: Cardiovascular;  Laterality: N/A;  . multiple D&C    . TONSILLECTOMY  age 73    Family History  Problem Relation Age of Onset  . Cancer Brother        Colon  . Coronary artery disease Neg Hx   . Heart attack Neg Hx     Social History   Socioeconomic History  . Marital status: Widowed    Spouse name: Not on file  . Number of children: 3  . Years of education: 78  . Highest education level: Not on file  Occupational History  . Occupation: Scientist, water quality  Social Needs  . Financial resource strain: Not on file  . Food insecurity    Worry: Not on file    Inability: Not on file  . Transportation needs    Medical: Not on file     Non-medical: Not on file  Tobacco Use  . Smoking status: Never Smoker  . Smokeless tobacco: Never Used  Substance and Sexual Activity  . Alcohol use: No  . Drug use: No  . Sexual activity: Never    Birth control/protection: Post-menopausal  Lifestyle  . Physical activity    Days per week: Not on file    Minutes per session: Not on file  . Stress: Not on file  Relationships  . Social Herbalist on phone: Not on file    Gets together: Not on file    Attends religious service: Not on file    Active member  of club or organization: Not on file    Attends meetings of clubs or organizations: Not on file    Relationship status: Not on file  . Intimate partner violence    Fear of current or ex partner: Not on file    Emotionally abused: Not on file    Physically abused: Not on file    Forced sexual activity: Not on file  Other Topics Concern  . Not on file  Social History Narrative   Widowed '13. 2 sons- '63, '66; one daughter-'69;    25 grandchildren--has raised 9 of her grandchildren 4 great grandchildren.   Lives with 2 granddaughters and adoptive son      Has living will   Probably would have son Fadia Wierman as health care POA--not set up   Would accept resuscitation but no prolonged ventilation   Not sure about tube feeds   Observations/Objective: Normal conversation No distress  Assessment and Plan: See problem list  Follow Up Instructions:    I discussed the assessment and treatment plan with the patient. The patient was provided an opportunity to ask questions and all were answered. The patient agreed with the plan and demonstrated an understanding of the instructions.   The patient was advised to call back or seek an in-person evaluation if the symptoms worsen or if the condition fails to improve as anticipated.  I provided 7 minutes of non-face-to-face time during this encounter.   [name], MD    Review of Systems     Objective:    Physical Exam         Assessment & Plan:

## 2019-01-30 NOTE — Assessment & Plan Note (Signed)
About the same Is able to maintain her functional status with the medications

## 2019-02-06 ENCOUNTER — Other Ambulatory Visit: Payer: Self-pay

## 2019-02-06 MED ORDER — OXYCODONE-ACETAMINOPHEN 10-325 MG PO TABS: 1.0000 | ORAL_TABLET | Freq: Four times a day (QID) | ORAL | 0 refills | Status: DC | PRN

## 2019-02-06 NOTE — Telephone Encounter (Signed)
Name of Medication: oxycodone apap 10-325 mg Name of Pharmacy: [facility] or Written Date and Quantity:# 150 on 01/06/19  Last Office Visit and Type:09/14/20for 3 mth narcotic FU  Next Office Visit and Type: 04/25/19 AWV Last Controlled Substance Agreement Date: 08/02/18 Last UDS:07/25/18

## 2019-03-08 ENCOUNTER — Other Ambulatory Visit: Payer: Self-pay

## 2019-03-08 MED ORDER — OXYCODONE-ACETAMINOPHEN 10-325 MG PO TABS: 1.0000 | ORAL_TABLET | Freq: Four times a day (QID) | ORAL | 0 refills | Status: DC | PRN

## 2019-03-08 NOTE — Telephone Encounter (Signed)
Name of Medication: oxycodone apap 10-325 Name of Pharmacy: [facility] or Written Date and Quantity: # 150 on 02/06/19 Last Office Visit and Type:01/30/19 for72mth narcotic FU  Next Office Visit and Type:04/25/19 CPX  Last Controlled Substance Agreement Date:08/02/18  Last UDS:07/25/18

## 2019-03-09 ENCOUNTER — Other Ambulatory Visit: Payer: Self-pay

## 2019-03-09 NOTE — Patient Outreach (Signed)
[facility]) Care Management  03/09/2019  KENIAH DONOFRIO [birth year] [id number]   Medication Adherence call to Mr. Kameron Bade HIPPA Compliant Voice message left with a call back number. Mrs. Rohler is showing past due on Atorvastatin 80 mg under [place].   [place] Management Direct Dial [phone]  Fax [phone] [email or web address]

## 2019-04-12 ENCOUNTER — Other Ambulatory Visit: Payer: Self-pay

## 2019-04-12 MED ORDER — OXYCODONE-ACETAMINOPHEN 10-325 MG PO TABS: 1.0000 | ORAL_TABLET | Freq: Four times a day (QID) | ORAL | 0 refills | Status: DC | PRN

## 2019-04-12 NOTE — Telephone Encounter (Signed)
Name of Medication: oxycodone apap 10-325 Name of Pharmacy:[facility] or Written Date and Quantity: # 150 on 03/08/19 Last Office Visit and Type: 01/30/19 for pain mgt Next Office Visit and Type: 04/25/19 CPX Last Controlled Substance Agreement Date: 08/02/18 Last UDS:07/25/18

## 2019-04-25 ENCOUNTER — Telehealth: Payer: Self-pay

## 2019-04-25 ENCOUNTER — Encounter: Payer: Medicare Other | Admitting: Internal Medicine

## 2019-04-25 NOTE — Telephone Encounter (Signed)
Cynthia Singh has already changed schedule. FYI to [facility] CMA.

## 2019-04-25 NOTE — Telephone Encounter (Signed)
[place] Night - Client Nonclinical Telephone Record AccessNurse Client [facility] Night - Client Client Site [place] Physician [name] - MD Contact Type Call Who Is Calling Patient / Member / Family / Caregiver Caller Name Amariah Pangelinan Caller Phone Number [phone] Patient Name Cynthia Singh Patient DOB 30-Apr-1941 Call Type Message Only Information Provided Reason for Call Request to Reschedule Office Appointment Initial Comment Caller states she has a 9:30am appt today, needing this, car won't start, and needing a call back to reschedule her appt. Additional Comment Provided information for the office, connected caller for further assistance. Disp. Time Disposition Final User 04/25/2019 8:03:33 AM General Information Provided Yes Rosana Fret Call Closed By: Rosana Fret Transaction Date/Time: 04/25/2019 8:00:31 AM (ET)

## 2019-05-03 ENCOUNTER — Telehealth: Payer: Self-pay | Admitting: Internal Medicine

## 2019-05-03 DIAGNOSIS — I739 Peripheral vascular disease, unspecified: Secondary | ICD-10-CM

## 2019-05-03 NOTE — Telephone Encounter (Signed)
Diane Warnock,NP,[facility] called.  Patient had a quantoflo screen today.  It showed severe bi lateral PAD. The results will be faxed to [facility] in 5-7 business days.

## 2019-05-04 ENCOUNTER — Encounter: Payer: Self-pay | Admitting: Internal Medicine

## 2019-05-04 NOTE — Telephone Encounter (Signed)
Noted She has not had claudication that I know of--will plan to review at her next visit

## 2019-05-11 ENCOUNTER — Other Ambulatory Visit: Payer: Self-pay | Admitting: *Deleted

## 2019-05-11 MED ORDER — OXYCODONE-ACETAMINOPHEN 10-325 MG PO TABS: 1.0000 | ORAL_TABLET | Freq: Four times a day (QID) | ORAL | 0 refills | Status: DC | PRN

## 2019-05-11 NOTE — Telephone Encounter (Signed)
Name of Medication: Percocet 10/325mg  Name of [facility] [address] [facility] Last Fill or Written Date and Quantity: 04/12/19 #150 Last Office Visit and Type: 01/30/19 3 mo f/u phone call Next Office Visit and Type: 05/24/19 CPE Last Controlled Substance Agreement Date: 08/02/18 Last UDS:07/25/18

## 2019-05-22 ENCOUNTER — Other Ambulatory Visit: Payer: Self-pay | Admitting: Internal Medicine

## 2019-05-24 ENCOUNTER — Encounter: Payer: Self-pay | Admitting: Internal Medicine

## 2019-05-24 ENCOUNTER — Ambulatory Visit (INDEPENDENT_AMBULATORY_CARE_PROVIDER_SITE_OTHER): Payer: Medicare Other | Admitting: Internal Medicine

## 2019-05-24 ENCOUNTER — Other Ambulatory Visit: Payer: Self-pay

## 2019-05-24 VITALS — BP 138/86 | HR 83 | Temp 97.4°F | Ht 67.0 in | Wt 187.0 lb

## 2019-05-24 DIAGNOSIS — Z0000 Encounter for general adult medical examination without abnormal findings: Secondary | ICD-10-CM

## 2019-05-24 DIAGNOSIS — E1159 Type 2 diabetes mellitus with other circulatory complications: Secondary | ICD-10-CM

## 2019-05-24 DIAGNOSIS — F1120 Opioid dependence, uncomplicated: Secondary | ICD-10-CM | POA: Diagnosis not present

## 2019-05-24 DIAGNOSIS — F39 Unspecified mood [affective] disorder: Secondary | ICD-10-CM | POA: Diagnosis not present

## 2019-05-24 DIAGNOSIS — E1165 Type 2 diabetes mellitus with hyperglycemia: Secondary | ICD-10-CM | POA: Diagnosis not present

## 2019-05-24 DIAGNOSIS — I25119 Atherosclerotic heart disease of native coronary artery with unspecified angina pectoris: Secondary | ICD-10-CM | POA: Diagnosis not present

## 2019-05-24 DIAGNOSIS — I739 Peripheral vascular disease, unspecified: Secondary | ICD-10-CM

## 2019-05-24 DIAGNOSIS — Z23 Encounter for immunization: Secondary | ICD-10-CM

## 2019-05-24 DIAGNOSIS — M5440 Lumbago with sciatica, unspecified side: Secondary | ICD-10-CM

## 2019-05-24 DIAGNOSIS — I10 Essential (primary) hypertension: Secondary | ICD-10-CM

## 2019-05-24 DIAGNOSIS — G8929 Other chronic pain: Secondary | ICD-10-CM

## 2019-05-24 DIAGNOSIS — Z7189 Other specified counseling: Secondary | ICD-10-CM

## 2019-05-24 LAB — COMPREHENSIVE METABOLIC PANEL
ALT: 13 U/L (ref 0–35)
AST: 11 U/L (ref 0–37)
Albumin: 4.4 g/dL (ref 3.5–5.2)
Alkaline Phosphatase: 86 U/L (ref 39–117)
BUN: 13 mg/dL (ref 6–23)
CO2: 27 mEq/L (ref 19–32)
Calcium: 9.5 mg/dL (ref 8.4–10.5)
Chloride: 96 mEq/L (ref 96–112)
Creatinine, Ser: 0.82 mg/dL (ref 0.40–1.20)
GFR: 67.77 mL/min (ref >60.00)
Glucose, Bld: 363 mg/dL — ABNORMAL HIGH (ref 70–99)
Potassium: 4 mEq/L (ref 3.5–5.1)
Sodium: 132 mEq/L — ABNORMAL LOW (ref 135–145)
Total Bilirubin: 0.5 mg/dL (ref 0.2–1.2)
Total Protein: 7.6 g/dL (ref 6.0–8.3)

## 2019-05-24 LAB — CBC
HCT: 44.2 % (ref 36.0–46.0)
Hemoglobin: 14.7 g/dL (ref 12.0–15.0)
MCHC: 33.2 g/dL (ref 30.0–36.0)
MCV: 89.6 fl (ref 78.0–100.0)
Platelets: 224 10*3/uL (ref 150.0–400.0)
RBC: 4.94 Mil/uL (ref 3.87–5.11)
RDW: 13.1 % (ref 11.5–15.5)
WBC: 11.1 10*3/uL — ABNORMAL HIGH (ref 4.0–10.5)

## 2019-05-24 LAB — LIPID PANEL
Cholesterol: 145 mg/dL (ref 0–200)
HDL: 38.9 mg/dL — ABNORMAL LOW (ref >39.00)
NonHDL: 105.87
Total CHOL/HDL Ratio: 4
Triglycerides: 297 mg/dL — ABNORMAL HIGH (ref 0.0–149.0)
VLDL: 59.4 mg/dL — ABNORMAL HIGH (ref 0.0–40.0)

## 2019-05-24 LAB — HEMOGLOBIN A1C: Hgb A1c MFr Bld: 13.8 % — ABNORMAL HIGH (ref 4.6–6.5)

## 2019-05-24 LAB — LDL CHOLESTEROL, DIRECT: Direct LDL: 75 mg/dL

## 2019-05-24 LAB — HM DIABETES FOOT EXAM

## 2019-05-24 NOTE — Assessment & Plan Note (Signed)
See social history 

## 2019-05-24 NOTE — Assessment & Plan Note (Signed)
I have personally reviewed the Medicare Annual Wellness questionnaire and have noted 1. The patient's medical and social history 2. Their use of alcohol, tobacco or illicit drugs 3. Their current medications and supplements 4. The patient's functional ability including ADL's, fall risks, home safety risks and hearing or visual             impairment. 5. Diet and physical activities 6. Evidence for depression or mood disorders  The patients weight, height, BMI and visual acuity have been recorded in the chart I have made referrals, counseling and provided education to the patient based review of the above and I have provided the pt with a written personalized care plan for preventive services.  I have provided you with a copy of your personalized plan for preventive services. Please take the time to review along with your updated medication list.  Flu vaccine today COVID vaccine 2 weeks from now or later No cancer screening is her preference Discussed physical and social activity

## 2019-05-24 NOTE — Progress Notes (Signed)
Subjective:    Patient ID: Cynthia Singh, female    DOB: Mar 03, 1941, 79 y.o.   MRN: [id number]  HPI Here for Medicare wellness visit and follow up of chronic health conditions This visit occurred during the SARS-CoV-2 public health emergency.  Safety protocols were in place, including screening questions prior to the visit, additional usage of staff PPE, and extensive cleaning of exam room while observing appropriate contact time as indicated for disinfecting solutions.   Reviewed form and advanced directives Reviewed other doctors No alcohol or tobacco Trying to walk some No falls Takes care of her own housework No apparent memory problems Vision okay--getting glasses later this month Hearing is fine  Does still get depressed---not every day though Not really anhedonic Tends to stay to herself--so done okay with COVID Son mostly shops for her  Same DOE---easy even walking in house No claudication Rarely checks sugars--- still high  Current Outpatient Medications on File Prior to Visit  Medication Sig Dispense Refill  . aspirin EC 81 MG tablet Take 81 mg by mouth daily.    Marland Kitchen atorvastatin (LIPITOR) 80 MG tablet Take 0.5 tablets (40 mg total) by mouth daily. 90 tablet 3  . carvedilol (COREG) 12.5 MG tablet Take 1 tablet (12.5 mg total) by mouth 2 (two) times daily with a meal. 180 tablet 3  . clopidogrel (PLAVIX) 75 MG tablet TAKE 1 TABLET BY MOUTH EVERY DAY 90 tablet 3  . empagliflozin (JARDIANCE) 25 MG TABS tablet Take 25 mg by mouth daily before breakfast. 30 tablet 5  . Insulin Glargine (LANTUS SOLOSTAR) 100 UNIT/ML Solostar Pen TAKE 36 UNITS DAILY UNDER SKIN 15 mL 11  . Insulin Pen Needle (PEN NEEDLES) 32G X 5 MM MISC 1 Units by Does not apply route daily. 100 each 3  . levETIRAcetam (KEPPRA) 500 MG tablet Take 1 tablet (500 mg total) by mouth 2 (two) times daily. 60 tablet 11  . losartan-hydrochlorothiazide (HYZAAR) 100-12.5 MG tablet Take 1 tablet by mouth daily. 90  tablet 3  . metFORMIN (GLUCOPHAGE-XR) 500 MG 24 hr tablet TAKE 2 TABLETS BY MOUTH EVERY DAY WITH BREAKFAST 180 tablet 3  . nitroGLYCERIN (NITROSTAT) 0.4 MG SL tablet Place 1 tablet (0.4 mg total) under the tongue every 5 (five) minutes as needed for chest pain. 30 tablet 0  . nortriptyline (PAMELOR) 50 MG capsule Take 3 capsules (150 mg total) by mouth at bedtime. 270 capsule 3  . Omega-3 Fatty Acids (FISH OIL PO) Take 1 capsule by mouth daily.    Marland Kitchen oxyCODONE-acetaminophen (PERCOCET) 10-325 MG tablet Take 1-2 tablets by mouth every 6 (six) hours as needed for pain. M54.9 150 tablet 0  . polyethylene glycol (MIRALAX / GLYCOLAX) packet Take 17 g by mouth daily.     No current facility-administered medications on file prior to visit.    No Known Allergies  Past Medical History:  Diagnosis Date  . CAD (coronary artery disease)    a. Remote nonobstructive disease but in 10/2012 Cath/PCI: s/p DES to RCA. b. cath 02/08/15 DES to prox LAD and balloon angioplasty of ost D1, EF 55-65%.  . Concussion    [place] AFTER FALL 3/18  . Depression   . Diabetes mellitus, type 2 ([place])   . Dyslipidemia   . Episodic mood disorder ([place])   . Hypertension   . Myocardial infarction ([place])    2014  . Obesity   . Osteoarthritis of spine    knees also  . Peripheral vascular disease, unspecified ([place])  2020   severe disease on home screening by insurance    Past Surgical History:  Procedure Laterality Date  . ABDOMINAL HYSTERECTOMY    . APPENDECTOMY  1959  . CARDIAC CATHETERIZATION N/A 02/08/2015   Procedure: Left Heart Cath and Coronary Angiography;  Surgeon: Sherren Mocha, MD;  Location: [place] [facility];  Service: Cardiovascular;  Laterality: N/A;  . CARDIAC CATHETERIZATION N/A 02/08/2015   Procedure: Coronary Stent Intervention;  Surgeon: Sherren Mocha, MD;  Location: [place] [facility];  Service: Cardiovascular;  Laterality: N/A;  . CARDIAC CATHETERIZATION N/A 02/08/2015   Procedure: Intravascular Pressure  Wire/FFR Study;  Surgeon: Sherren Mocha, MD;  Location: [place] [facility];  Service: Cardiovascular;  Laterality: N/A;  . CATARACT EXTRACTION W/PHACO Left 11/10/2016   Procedure: CATARACT EXTRACTION PHACO AND INTRAOCULAR LENS PLACEMENT ([place]) suture placed in left eye at end of procedure;  Surgeon: Birder Robson, MD;  Location: [facility];  Service: Ophthalmology;  Laterality: Left;  [place]  3.20 AP% 27.7 CDE 55.63 Fluid pack lot # [id number]  . CHOLECYSTECTOMY    . COMBINED HYSTERECTOMY ABDOMINAL W/ A&P REPAIR / OOPHORECTOMY  1968  . CORONARY ANGIOPLASTY     STENTS X 5  . CORONARY STENT PLACEMENT  2014  . DILATION AND CURETTAGE OF UTERUS    . [place]  . LEFT HEART CATHETERIZATION WITH CORONARY ANGIOGRAM N/A 10/20/2012   Procedure: LEFT HEART CATHETERIZATION WITH CORONARY ANGIOGRAM;  Surgeon: Sherren Mocha, MD;  Location: [facility] CATH LAB;  Service: Cardiovascular;  Laterality: N/A;  . multiple D&C    . TONSILLECTOMY  age 11    Family History  Problem Relation Age of Onset  . Cancer Brother        Colon  . Coronary artery disease Neg Hx   . Heart attack Neg Hx     Social History   Socioeconomic History  . Marital status: Widowed    Spouse name: Not on file  . Number of children: 3  . Years of education: 43  . Highest education level: Not on file  Occupational History  . Occupation: Scientist, water quality  Tobacco Use  . Smoking status: Never Smoker  . Smokeless tobacco: Never Used  Substance and Sexual Activity  . Alcohol use: No  . Drug use: No  . Sexual activity: Never    Birth control/protection: Post-menopausal  Other Topics Concern  . Not on file  Social History Narrative   Widowed '13. 2 sons- '63, '66; one daughter-'69;    53 grandchildren--has raised 9 of her grandchildren 4 great grandchildren.   Lives with 2 granddaughters and adoptive son      Has living will   Probably would have son Sistine Haslett as health care POA--not set up   Would accept resuscitation but  no prolonged ventilation   Not sure about tube feeds   Social Determinants of Health   Financial Resource Strain:   . Difficulty of Paying Living Expenses: Not on file  Food Insecurity:   . Worried About Charity fundraiser in the Last Year: Not on file  . Ran Out of Food in the Last Year: Not on file  Transportation Needs:   . Lack of Transportation (Medical): Not on file  . Lack of Transportation (Non-Medical): Not on file  Physical Activity:   . Days of Exercise per Week: Not on file  . Minutes of Exercise per Session: Not on file  Stress:   . Feeling of Stress : Not on file  Social Connections:   . Frequency of Communication with Friends and Family: Not on file  . Frequency of Social Gatherings with Friends and Family: Not on file  . Attends Religious Services: Not on file  . Active Member of Clubs or Organizations: Not on file  . Attends Archivist Meetings: Not on file  . Marital Status: Not on file  Intimate Partner Violence:   . Fear of Current or Ex-Partner: Not on file  . Emotionally Abused: Not on file  . Physically Abused: Not on file  . Sexually Abused: Not on file    Review of Systems Not eating as much--but hasn't lost weight Not a great sleeper Wears seat belt Lost lower plate---needs new one. Full dentures No rash or skin ulcers. No concerning lesions Rare heartburn--- uses OTC with success. No dysphagia Bowels move fine. No blood Voids fine. No hematuria or incontinence    Objective:   Physical Exam  Constitutional: She is oriented to person, place, and time. She appears well-developed. No distress.  HENT:  Mouth/Throat: Oropharynx is clear and moist. No oropharyngeal exudate.  Neck: No thyromegaly present.  Cardiovascular: Normal rate, regular rhythm and normal heart sounds. Exam reveals no gallop.  No murmur heard. Faint PT pulses bilaterally  Respiratory: Effort normal and breath sounds normal. No respiratory distress. She has no  wheezes. She has no rales.  GI: Soft. There is no abdominal tenderness.  Musculoskeletal:        General: No edema.     Comments: Limited with mobility and back ROM  Lymphadenopathy:    She has no cervical adenopathy.  Neurological: She is alert and oriented to person, place, and time.  President---"Trump, Obama, ?" [id number] Recall 3/3  Decreased sensation in feet  Skin: No rash noted. No erythema.  Psychiatric: She has a normal mood and affect. Her behavior is normal.           Assessment & Plan:

## 2019-05-24 NOTE — Assessment & Plan Note (Signed)
PDMP reviewed No concerns 

## 2019-05-24 NOTE — Assessment & Plan Note (Signed)
BP Readings from Last 3 Encounters:  05/24/19 138/86  07/25/18 140/88  04/06/18 140/78   Okay on her medications Due for lab testing today

## 2019-05-24 NOTE — Assessment & Plan Note (Signed)
No chest pain Stable DOE---seems to be anginal equivalent. No change Tries to walk No dizziness or syncope No edema

## 2019-05-24 NOTE — Assessment & Plan Note (Signed)
Ongoing pain Also helps her knees---not candidate for surgery now due to high sugars

## 2019-05-24 NOTE — Progress Notes (Signed)
Hearing Screening   125Hz  250Hz  500Hz  1000Hz  2000Hz  3000Hz  4000Hz  6000Hz  8000Hz   Right ear:   20 20 25   40    Left ear:   25 25 25   0    Vision Screening Comments: January 2020. Appt near end of the month.

## 2019-05-24 NOTE — Assessment & Plan Note (Signed)
Only checks sugars occasionally Usually over 200 fasting Known heart disease and HTN Has foot numbness lately--and up to knees even

## 2019-05-24 NOTE — Assessment & Plan Note (Signed)
Dysthymia but no MDD No meds Tough with losing friends, etc

## 2019-05-24 NOTE — Assessment & Plan Note (Signed)
Had positive screen by insurance company Chronic neuropathy Is on statin and ASA No clear claudication--so will hold off on further evaluation

## 2019-06-09 ENCOUNTER — Other Ambulatory Visit: Payer: Self-pay | Admitting: Internal Medicine

## 2019-06-09 MED ORDER — OXYCODONE-ACETAMINOPHEN 10-325 MG PO TABS: 1.0000 | ORAL_TABLET | Freq: Four times a day (QID) | ORAL | 0 refills | Status: DC | PRN

## 2019-06-09 NOTE — Telephone Encounter (Signed)
Name of Medication: Percocet 10/325mg  Name of [facility] [address] Last Fill or Written Date and Quantity: 05/11/19 #150 Last Office Visit and Type: 05/24/19 CPE Next Office Visit and Type: 08/24/19 follow up Last Controlled Substance Agreement Date:08/02/18 Last UDS:07/25/18

## 2019-06-09 NOTE — Telephone Encounter (Signed)
Last filled on 04/07/2018 #270 with 3 refills.  LOV 05/24/19 CPE Next appointment on 08/24/19

## 2019-06-10 ENCOUNTER — Other Ambulatory Visit: Payer: Self-pay | Admitting: Internal Medicine

## 2019-06-14 ENCOUNTER — Ambulatory Visit (INDEPENDENT_AMBULATORY_CARE_PROVIDER_SITE_OTHER): Payer: Medicare Other | Admitting: Family Medicine

## 2019-06-14 ENCOUNTER — Encounter: Payer: Self-pay | Admitting: Family Medicine

## 2019-06-14 ENCOUNTER — Other Ambulatory Visit: Payer: Self-pay

## 2019-06-14 VITALS — BP 132/70 | HR 96 | Temp 97.3°F | Ht 67.0 in | Wt 189.1 lb

## 2019-06-14 DIAGNOSIS — R300 Dysuria: Secondary | ICD-10-CM | POA: Diagnosis not present

## 2019-06-14 DIAGNOSIS — N949 Unspecified condition associated with female genital organs and menstrual cycle: Secondary | ICD-10-CM | POA: Diagnosis not present

## 2019-06-14 LAB — POCT URINALYSIS DIPSTICK
Bilirubin, UA: NEGATIVE
Blood, UA: NEGATIVE
Glucose, UA: POSITIVE — AB
Ketones, UA: NEGATIVE
Leukocytes, UA: NEGATIVE
Nitrite, UA: NEGATIVE
Protein, UA: NEGATIVE
Spec Grav, UA: 1.015 (ref 1.010–1.025)
Urobilinogen, UA: 0.2 E.U./dL
pH, UA: 6 (ref 5.0–8.0)

## 2019-06-14 LAB — POCT WET PREP (WET MOUNT): Trichomonas Wet Prep HPF POC: ABSENT

## 2019-06-14 MED ORDER — FLUCONAZOLE 150 MG PO TABS: ORAL_TABLET | ORAL | 0 refills | Status: DC

## 2019-06-14 NOTE — Progress Notes (Signed)
Subjective:    Patient ID: Cynthia Singh, female    DOB: 08/18/1940, 79 y.o.   MRN: [id number]  HPI 79 yo pt of Dr Silvio Pate presents with urinary symptoms She is diabetic with recent A1C of 13.8   Symptoms started 2 weeks ago  On and off   Raw "down there" as well as itching  No vaginal discharge  She is not sexually active  Hurts to urinate  Some pain on her L side /flank   She tried monistat - helped temporarily  Insert   One time ? Drop of blood in urine -none after that   At night she has frequent urination  No incontinence   Blood sugar tends to run high   No nausea  No fever    Wt Readings from Last 3 Encounters:  06/14/19 189 lb 1.6 oz (85.8 kg)  05/24/19 187 lb (84.8 kg)  01/30/19 198 lb (89.8 kg)   29.62 kg/m  ua is clear (with glucose) and wet prep shows yeast today  Results for orders placed or performed in visit on 06/14/19  POCT urinalysis dipstick  Result Value Ref Range   Color, UA yellow    Clarity, UA cloudy    Glucose, UA Positive (A) Negative   Bilirubin, UA neg    Ketones, UA neg    Spec Grav, UA 1.015 1.010 - 1.025   Blood, UA neg    pH, UA 6.0 5.0 - 8.0   Protein, UA Negative Negative   Urobilinogen, UA 0.2 0.2 or 1.0 E.U./dL   Nitrite, UA neg    Leukocytes, UA Negative Negative   Appearance     Odor    POCT Wet Prep ([place])  Result Value Ref Range   Source Wet Prep POC vaginal    WBC, Wet Prep HPF POC few    Bacteria Wet Prep HPF POC Few Few   BACTERIA WET PREP MORPHOLOGY POC     Clue Cells Wet Prep HPF POC None None   Clue Cells Wet Prep [place] POC     Yeast Wet Prep HPF POC Moderate (A) None   KOH Wet Prep POC Moderate (A) None   Trichomonas Wet Prep HPF POC Absent Absent    Patient Active Problem List   Diagnosis Date Noted  . Peripheral vascular disease, unspecified ([place]) 2020  . Narcotic dependence ([place]) 10/29/2016  . CAD S/P percutaneous coronary angioplasty/DES (RCA-2014/LAD-2016) 07/19/2016  .  Preventative health care 04/30/2014  . Advanced directives, counseling/discussion 04/30/2014  . Atherosclerosis of native coronary artery with angina pectoris ([place]) 10/21/2012  . KNEE PAIN, LEFT, CHRONIC 02/25/2010  . Poorly controlled type 2 diabetes mellitus with circulatory disorder ([place]) 12/30/2009  . Hyperlipidemia 12/30/2009  . Episodic mood disorder ([place]) 12/30/2009  . Essential hypertension 12/30/2009  . Chronic back pain 12/30/2009   Past Medical History:  Diagnosis Date  . CAD (coronary artery disease)    a. Remote nonobstructive disease but in 10/2012 Cath/PCI: s/p DES to RCA. b. cath 02/08/15 DES to prox LAD and balloon angioplasty of ost D1, EF 55-65%.  . Concussion    [place] AFTER FALL 3/18  . Depression   . Diabetes mellitus, type 2 ([place])   . Dyslipidemia   . Episodic mood disorder ([place])   . Hypertension   . Myocardial infarction ([place])    2014  . Obesity   . Osteoarthritis of spine    knees also  . Peripheral vascular disease, unspecified ([place]) 2020   severe  disease on home screening by insurance   Past Surgical History:  Procedure Laterality Date  . ABDOMINAL HYSTERECTOMY    . APPENDECTOMY  1959  . CARDIAC CATHETERIZATION N/A 02/08/2015   Procedure: Left Heart Cath and Coronary Angiography;  Surgeon: Sherren Mocha, MD;  Location: [place] [facility];  Service: Cardiovascular;  Laterality: N/A;  . CARDIAC CATHETERIZATION N/A 02/08/2015   Procedure: Coronary Stent Intervention;  Surgeon: Sherren Mocha, MD;  Location: [place] [facility];  Service: Cardiovascular;  Laterality: N/A;  . CARDIAC CATHETERIZATION N/A 02/08/2015   Procedure: Intravascular Pressure Wire/FFR Study;  Surgeon: Sherren Mocha, MD;  Location: [place] [facility];  Service: Cardiovascular;  Laterality: N/A;  . CATARACT EXTRACTION W/PHACO Left 11/10/2016   Procedure: CATARACT EXTRACTION PHACO AND INTRAOCULAR LENS PLACEMENT ([place]) suture placed in left eye at end of procedure;  Surgeon: Birder Robson,  MD;  Location: [facility];  Service: Ophthalmology;  Laterality: Left;  [place]  3.20 AP% 27.7 CDE 55.63 Fluid pack lot # [id number]  . CHOLECYSTECTOMY    . COMBINED HYSTERECTOMY ABDOMINAL W/ A&P REPAIR / OOPHORECTOMY  1968  . CORONARY ANGIOPLASTY     STENTS X 5  . CORONARY STENT PLACEMENT  2014  . DILATION AND CURETTAGE OF UTERUS    . [place]  . LEFT HEART CATHETERIZATION WITH CORONARY ANGIOGRAM N/A 10/20/2012   Procedure: LEFT HEART CATHETERIZATION WITH CORONARY ANGIOGRAM;  Surgeon: Sherren Mocha, MD;  Location: [facility] CATH LAB;  Service: Cardiovascular;  Laterality: N/A;  . multiple D&C    . TONSILLECTOMY  age 39   Social History   Tobacco Use  . Smoking status: Never Smoker  . Smokeless tobacco: Never Used  Substance Use Topics  . Alcohol use: No  . Drug use: No   Family History  Problem Relation Age of Onset  . Cancer Brother        Colon  . Coronary artery disease Neg Hx   . Heart attack Neg Hx    No Known Allergies Current Outpatient Medications on File Prior to Visit  Medication Sig Dispense Refill  . aspirin EC 81 MG tablet Take 81 mg by mouth daily.    Marland Kitchen atorvastatin (LIPITOR) 80 MG tablet TAKE 1/2 TABLETS (40 MG TOTAL) BY MOUTH DAILY. 45 tablet 3  . carvedilol (COREG) 12.5 MG tablet Take 1 tablet (12.5 mg total) by mouth 2 (two) times daily with a meal. 180 tablet 3  . clopidogrel (PLAVIX) 75 MG tablet TAKE 1 TABLET BY MOUTH EVERY DAY 90 tablet 3  . empagliflozin (JARDIANCE) 25 MG TABS tablet Take 25 mg by mouth daily before breakfast. 30 tablet 5  . Insulin Glargine (LANTUS SOLOSTAR) 100 UNIT/ML Solostar Pen TAKE 36 UNITS DAILY UNDER SKIN 15 mL 11  . Insulin Pen Needle (PEN NEEDLES) 32G X 5 MM MISC 1 Units by Does not apply route daily. 100 each 3  . losartan-hydrochlorothiazide (HYZAAR) 100-12.5 MG tablet Take 1 tablet by mouth daily. 90 tablet 3  . metFORMIN (GLUCOPHAGE-XR) 500 MG 24 hr tablet TAKE 2 TABLETS BY MOUTH EVERY DAY WITH BREAKFAST 180 tablet 3    . nitroGLYCERIN (NITROSTAT) 0.4 MG SL tablet Place 1 tablet (0.4 mg total) under the tongue every 5 (five) minutes as needed for chest pain. 30 tablet 0  . nortriptyline (PAMELOR) 50 MG capsule TAKE 3 CAPSULES (150 MG TOTAL) BY MOUTH AT BEDTIME. 270 capsule 3  . Omega-3 Fatty Acids (FISH OIL PO) Take 1 capsule by mouth daily.    Marland Kitchen  oxyCODONE-acetaminophen (PERCOCET) 10-325 MG tablet Take 1-2 tablets by mouth every 6 (six) hours as needed for pain. M54.9 150 tablet 0  . polyethylene glycol (MIRALAX / GLYCOLAX) packet Take 17 g by mouth daily.     No current facility-administered medications on file prior to visit.     Review of Systems  Constitutional: Negative for activity change, appetite change, fatigue, fever and unexpected weight change.  HENT: Negative for congestion, ear pain, rhinorrhea, sinus pressure and sore throat.   Eyes: Negative for pain, redness and visual disturbance.  Respiratory: Negative for cough, shortness of breath and wheezing.   Cardiovascular: Negative for chest pain and palpitations.  Gastrointestinal: Negative for abdominal pain, blood in stool, constipation and diarrhea.  Endocrine: Positive for polyuria. Negative for polydipsia.  Genitourinary: Positive for dysuria, frequency and vaginal pain. Negative for genital sores, hematuria, pelvic pain, urgency, vaginal bleeding and vaginal discharge.  Musculoskeletal: Negative for arthralgias, back pain and myalgias.  Skin: Negative for pallor and rash.  Allergic/Immunologic: Negative for environmental allergies.  Neurological: Negative for dizziness, syncope and headaches.  Hematological: Negative for adenopathy. Does not bruise/bleed easily.  Psychiatric/Behavioral: Negative for decreased concentration and dysphoric mood. The patient is not nervous/anxious.        Objective:   Physical Exam Constitutional:      General: She is not in acute distress.    Appearance: Normal appearance. She is obese. She is not  ill-appearing or diaphoretic.  HENT:     Mouth/Throat:     Mouth: Mucous membranes are moist.  Eyes:     General: No scleral icterus.    Conjunctiva/sclera: Conjunctivae normal.     Pupils: Pupils are equal, round, and reactive to light.  Cardiovascular:     Rate and Rhythm: Regular rhythm. Tachycardia present.  Pulmonary:     Effort: Pulmonary effort is normal. No respiratory distress.     Breath sounds: Normal breath sounds. No wheezing.  Abdominal:     General: Abdomen is flat. Bowel sounds are normal. There is no distension.     Palpations: Abdomen is soft. There is no mass.     Tenderness: There is no abdominal tenderness. There is no right CVA tenderness or left CVA tenderness.     Comments: No suprapubic tenderness or fullness    Genitourinary:    Comments: Labia minora are hyperemic and very slightly swollen  No lesions noted Atrophic mucosa No vaginal discharge Wet prep obtained  Skin:    General: Skin is warm and dry.     Coloration: Skin is not jaundiced or pale.     Findings: No erythema or rash.  Neurological:     Mental Status: She is alert.  Psychiatric:        Mood and Affect: Mood normal.           Assessment & Plan:   Problem List Items Addressed This Visit      Other   Vaginal discomfort - Primary    With yeast on wet prep in a poorly controlled diabetic  ua is clear (except for glucose)  Suspect urinary pain happens when urine contacts labia  Pt had partial imp with topical monistat formerly Will tx with diflucan 150 mg now and another in 3 d Update if not starting to improve in a week or if worsening        Relevant Orders   POCT Wet Prep [facility]) (Completed)    Other Visit Diagnoses    Dysuria  Relevant Orders   POCT urinalysis dipstick (Completed)

## 2019-06-14 NOTE — Patient Instructions (Signed)
I think your discomfort is coming from vaginitis (yeast)  Take the diflucan pill one today and one in 3 days If no improvement by early next week let us know   Do your best to eat a diabetic diet to control your blood glucose   If symptoms change or worsen let us know

## 2019-06-14 NOTE — Assessment & Plan Note (Signed)
With yeast on wet prep in a poorly controlled diabetic  ua is clear (except for glucose)  Suspect urinary pain happens when urine contacts labia  Pt had partial imp with topical monistat formerly Will tx with diflucan 150 mg now and another in 3 d Update if not starting to improve in a week or if worsening

## 2019-07-12 ENCOUNTER — Other Ambulatory Visit: Payer: Self-pay | Admitting: *Deleted

## 2019-07-12 MED ORDER — OXYCODONE-ACETAMINOPHEN 10-325 MG PO TABS: 1.0000 | ORAL_TABLET | Freq: Four times a day (QID) | ORAL | 0 refills | Status: DC | PRN

## 2019-07-12 NOTE — Telephone Encounter (Signed)
Patient left a voicemail requesting a refill on Percocet.  Name of Medication: Percocet [facility] [address] Last [place] or Written Date and Quantity: 06/09/19 #150 Last Office Visit and Type: 06/14/19 acute Next Office Visit and Type: 08/24/19 follow-up Last Controlled Substance Agreement Date: 08/02/18 Last UDS:08/04/18

## 2019-08-09 ENCOUNTER — Other Ambulatory Visit: Payer: Self-pay

## 2019-08-09 MED ORDER — OXYCODONE-ACETAMINOPHEN 10-325 MG PO TABS: 1.0000 | ORAL_TABLET | Freq: Four times a day (QID) | ORAL | 0 refills | Status: DC | PRN

## 2019-08-09 NOTE — Telephone Encounter (Signed)
Name of Medication: oxycodone apap 10-325 mg Name of [facility]  Last Fill or Written Date and Quantity:#150 on 07/12/19  Last Office Visit and Type:05/24/19 [place] & pain mgt  Next Office Visit and Type:08/24/19 pain mgt  Last Controlled Substance Agreement Date: 08/02/2018 Last UDS:07/25/2018

## 2019-08-23 ENCOUNTER — Other Ambulatory Visit: Payer: Self-pay

## 2019-08-24 ENCOUNTER — Encounter: Payer: Self-pay | Admitting: Internal Medicine

## 2019-08-24 ENCOUNTER — Ambulatory Visit (INDEPENDENT_AMBULATORY_CARE_PROVIDER_SITE_OTHER): Payer: Medicare Other | Admitting: Internal Medicine

## 2019-08-24 VITALS — BP 134/84 | HR 90 | Temp 95.8°F | Ht 67.0 in | Wt 187.5 lb

## 2019-08-24 DIAGNOSIS — E1165 Type 2 diabetes mellitus with hyperglycemia: Secondary | ICD-10-CM | POA: Diagnosis not present

## 2019-08-24 DIAGNOSIS — M5440 Lumbago with sciatica, unspecified side: Secondary | ICD-10-CM | POA: Diagnosis not present

## 2019-08-24 DIAGNOSIS — E1159 Type 2 diabetes mellitus with other circulatory complications: Secondary | ICD-10-CM | POA: Diagnosis not present

## 2019-08-24 DIAGNOSIS — G8929 Other chronic pain: Secondary | ICD-10-CM

## 2019-08-24 DIAGNOSIS — F1120 Opioid dependence, uncomplicated: Secondary | ICD-10-CM | POA: Diagnosis not present

## 2019-08-24 LAB — POCT GLYCOSYLATED HEMOGLOBIN (HGB A1C): Hemoglobin A1C: 11.8 % — AB (ref 4.0–5.6)

## 2019-08-24 MED ORDER — NITROGLYCERIN 0.4 MG SL SUBL: 0.4000 mg | SUBLINGUAL_TABLET | SUBLINGUAL | 0 refills | Status: AC | PRN

## 2019-08-24 NOTE — Assessment & Plan Note (Signed)
PDMP reviewed No concerns 

## 2019-08-24 NOTE — Assessment & Plan Note (Signed)
Ongoing severe pain No functional changes

## 2019-08-24 NOTE — Progress Notes (Signed)
Subjective:    Patient ID: Cynthia Singh, female    DOB: 1940/12/30, 79 y.o.   MRN: [id number]  HPI Here for follow up of chronic pain and poorly controlled diabetes This visit occurred during the SARS-CoV-2 public health emergency.  Safety protocols were in place, including screening questions prior to the visit, additional usage of staff PPE, and extensive cleaning of exam room while observing appropriate contact time as indicated for disinfecting solutions.   Still notices that she is peeing a lot Some discomfort with voiding still Rash has resolved Checking sugars daily---over 200 Eating some better  Cranberry juice--helps the voiding feeling (but discussed the sugar in the juice will make things worse)  Back pain about the same Leg pain is worse Has to stand for a while upon arising to be able to move (stiff) Using the #150 every day  Current Outpatient Medications on File Prior to Visit  Medication Sig Dispense Refill  . aspirin EC 81 MG tablet Take 81 mg by mouth daily.    Marland Kitchen atorvastatin (LIPITOR) 80 MG tablet TAKE 1/2 TABLETS (40 MG TOTAL) BY MOUTH DAILY. 45 tablet 3  . carvedilol (COREG) 12.5 MG tablet Take 1 tablet (12.5 mg total) by mouth 2 (two) times daily with a meal. 180 tablet 3  . clopidogrel (PLAVIX) 75 MG tablet TAKE 1 TABLET BY MOUTH EVERY DAY 90 tablet 3  . empagliflozin (JARDIANCE) 25 MG TABS tablet Take 25 mg by mouth daily before breakfast. 30 tablet 5  . Insulin Glargine (LANTUS SOLOSTAR) 100 UNIT/ML Solostar Pen TAKE 36 UNITS DAILY UNDER SKIN 15 mL 11  . Insulin Pen Needle (PEN NEEDLES) 32G X 5 MM MISC 1 Units by Does not apply route daily. 100 each 3  . losartan-hydrochlorothiazide (HYZAAR) 100-12.5 MG tablet Take 1 tablet by mouth daily. 90 tablet 3  . metFORMIN (GLUCOPHAGE-XR) 500 MG 24 hr tablet TAKE 2 TABLETS BY MOUTH EVERY DAY WITH BREAKFAST 180 tablet 3  . nitroGLYCERIN (NITROSTAT) 0.4 MG SL tablet Place 1 tablet (0.4 mg total) under the tongue  every 5 (five) minutes as needed for chest pain. 30 tablet 0  . nortriptyline (PAMELOR) 50 MG capsule TAKE 3 CAPSULES (150 MG TOTAL) BY MOUTH AT BEDTIME. 270 capsule 3  . Omega-3 Fatty Acids (FISH OIL PO) Take 1 capsule by mouth daily.    Marland Kitchen oxyCODONE-acetaminophen (PERCOCET) 10-325 MG tablet Take 1-2 tablets by mouth every 6 (six) hours as needed for pain. M54.9 150 tablet 0  . polyethylene glycol (MIRALAX / GLYCOLAX) packet Take 17 g by mouth daily.     No current facility-administered medications on file prior to visit.    No Known Allergies  Past Medical History:  Diagnosis Date  . CAD (coronary artery disease)    a. Remote nonobstructive disease but in 10/2012 Cath/PCI: s/p DES to RCA. b. cath 02/08/15 DES to prox LAD and balloon angioplasty of ost D1, EF 55-65%.  . Concussion    [place] AFTER FALL 3/18  . Depression   . Diabetes mellitus, type 2 ([place])   . Dyslipidemia   . Episodic mood disorder ([place])   . Hypertension   . Myocardial infarction ([place])    2014  . Obesity   . Osteoarthritis of spine    knees also  . Peripheral vascular disease, unspecified ([place]) 2020   severe disease on home screening by insurance    Past Surgical History:  Procedure Laterality Date  . ABDOMINAL HYSTERECTOMY    . APPENDECTOMY  [place] N/A 02/08/2015   Procedure: Left Heart Cath and Coronary Angiography;  Surgeon: Sherren Mocha, MD;  Location: [place] [facility];  Service: Cardiovascular;  Laterality: N/A;  . CARDIAC CATHETERIZATION N/A 02/08/2015   Procedure: Coronary Stent Intervention;  Surgeon: Sherren Mocha, MD;  Location: [place] [facility];  Service: Cardiovascular;  Laterality: N/A;  . CARDIAC CATHETERIZATION N/A 02/08/2015   Procedure: Intravascular Pressure Wire/FFR Study;  Surgeon: Sherren Mocha, MD;  Location: [place] [facility];  Service: Cardiovascular;  Laterality: N/A;  . CATARACT EXTRACTION W/PHACO Left 11/10/2016   Procedure: CATARACT EXTRACTION PHACO AND  INTRAOCULAR LENS PLACEMENT ([place]) suture placed in left eye at end of procedure;  Surgeon: Birder Robson, MD;  Location: [facility];  Service: Ophthalmology;  Laterality: Left;  [place]  3.20 AP% 27.7 CDE 55.63 Fluid pack lot # [id number]  . CHOLECYSTECTOMY    . COMBINED HYSTERECTOMY ABDOMINAL W/ A&P REPAIR / OOPHORECTOMY  1968  . CORONARY ANGIOPLASTY     STENTS X 5  . CORONARY STENT PLACEMENT  2014  . DILATION AND CURETTAGE OF UTERUS    . [place]  . LEFT HEART CATHETERIZATION WITH CORONARY ANGIOGRAM N/A 10/20/2012   Procedure: LEFT HEART CATHETERIZATION WITH CORONARY ANGIOGRAM;  Surgeon: Sherren Mocha, MD;  Location: [facility];  Service: Cardiovascular;  Laterality: N/A;  . multiple D&C    . TONSILLECTOMY  age 65    Family History  Problem Relation Age of Onset  . Cancer Brother        Colon  . Coronary artery disease Neg Hx   . Heart attack Neg Hx     Social History   Socioeconomic History  . Marital status: Widowed    Spouse name: Not on file  . Number of children: 3  . Years of education: 50  . Highest education level: Not on file  Occupational History  . Occupation: Scientist, water quality  Tobacco Use  . Smoking status: Never Smoker  . Smokeless tobacco: Never Used  Substance and Sexual Activity  . Alcohol use: No  . Drug use: No  . Sexual activity: Never    Birth control/protection: Post-menopausal  Other Topics Concern  . Not on file  Social History Narrative   Widowed '13. 2 sons- '63, '66; one daughter-'69;    85 grandchildren--has raised 9 of her grandchildren 4 great grandchildren.   Lives with 2 granddaughters and adoptive son      Has living will   Probably would have son Donnamae Kepler as health care POA--not set up   Would accept resuscitation but no prolonged ventilation   Not sure about tube feeds   Social Determinants of Health   Financial Resource Strain:   . Difficulty of Paying Living Expenses:   Food Insecurity:   . Worried About Ship broker in the Last Year:   . Arboriculturist in the Last Year:   Transportation Needs:   . Film/video editor (Medical):   Marland Kitchen Lack of Transportation (Non-Medical):   Physical Activity:   . Days of Exercise per Week:   . Minutes of Exercise per Session:   Stress:   . Feeling of Stress :   Social Connections:   . Frequency of Communication with Friends and Family:   . Frequency of Social Gatherings with Friends and Family:   . Attends Religious Services:   . Active Member of Clubs or Organizations:   . Attends Archivist Meetings:   .  Marital Status:   Intimate Partner Violence:   . Fear of Current or Ex-Partner:   . Emotionally Abused:   Marland Kitchen Physically Abused:   . Sexually Abused:    Review of Systems  Eating less than before Weight is about the same     Objective:   Physical Exam  Constitutional: She appears well-developed. No distress.  Psychiatric: She has a normal mood and affect. Her behavior is normal.           Assessment & Plan:

## 2019-08-24 NOTE — Assessment & Plan Note (Signed)
Lab Results  Component Value Date   HGBA1C 11.8 (A) 08/24/2019   Much better but still totally out of control Urged her to set up appt with Dr Cruzita Lederer

## 2019-08-24 NOTE — Patient Instructions (Signed)
Please set up appointment with Dr Cruzita Lederer as soon as possible.

## 2019-08-26 LAB — PAIN MGMT, PROFILE 8 W/CONF, U
6 Acetylmorphine: NEGATIVE ng/mL
Alcohol Metabolites: NEGATIVE ng/mL (ref <500)
Amphetamines: NEGATIVE ng/mL
Benzodiazepines: NEGATIVE ng/mL
Buprenorphine, Urine: NEGATIVE ng/mL
Cocaine Metabolite: NEGATIVE ng/mL
Codeine: NEGATIVE ng/mL
Creatinine: 50.1 mg/dL
Hydrocodone: NEGATIVE ng/mL
Hydromorphone: NEGATIVE ng/mL
MDMA: NEGATIVE ng/mL
Marijuana Metabolite: NEGATIVE ng/mL
Morphine: NEGATIVE ng/mL
Norhydrocodone: NEGATIVE ng/mL
Noroxycodone: 4223 ng/mL
Opiates: NEGATIVE ng/mL
Oxidant: NEGATIVE ug/mL
Oxycodone: 3888 ng/mL
Oxycodone: POSITIVE ng/mL
Oxymorphone: 2082 ng/mL
pH: 6.1 (ref 4.5–9.0)

## 2019-09-04 ENCOUNTER — Inpatient Hospital Stay
Admission: EM | Admit: 2019-09-04 | Discharge: 2019-09-06 | DRG: 303 | Disposition: A | Discharge status: Home / Self Care | Payer: Medicare Other | Attending: Internal Medicine | Admitting: Internal Medicine

## 2019-09-04 ENCOUNTER — Other Ambulatory Visit: Payer: Self-pay

## 2019-09-04 ENCOUNTER — Emergency Department: Payer: Medicare Other

## 2019-09-04 ENCOUNTER — Encounter: Payer: Self-pay | Admitting: Radiology

## 2019-09-04 DIAGNOSIS — R519 Headache, unspecified: Secondary | ICD-10-CM | POA: Diagnosis not present

## 2019-09-04 DIAGNOSIS — Z7902 Long term (current) use of antithrombotics/antiplatelets: Secondary | ICD-10-CM

## 2019-09-04 DIAGNOSIS — Z20822 Contact with and (suspected) exposure to covid-19: Secondary | ICD-10-CM | POA: Diagnosis present

## 2019-09-04 DIAGNOSIS — I672 Cerebral atherosclerosis: Secondary | ICD-10-CM | POA: Diagnosis not present

## 2019-09-04 DIAGNOSIS — I214 Non-ST elevation (NSTEMI) myocardial infarction: Secondary | ICD-10-CM | POA: Diagnosis not present

## 2019-09-04 DIAGNOSIS — I160 Hypertensive urgency: Secondary | ICD-10-CM

## 2019-09-04 DIAGNOSIS — M5489 Other dorsalgia: Secondary | ICD-10-CM | POA: Diagnosis not present

## 2019-09-04 DIAGNOSIS — I252 Old myocardial infarction: Secondary | ICD-10-CM

## 2019-09-04 DIAGNOSIS — E781 Pure hyperglyceridemia: Secondary | ICD-10-CM | POA: Diagnosis present

## 2019-09-04 DIAGNOSIS — E1151 Type 2 diabetes mellitus with diabetic peripheral angiopathy without gangrene: Secondary | ICD-10-CM | POA: Diagnosis present

## 2019-09-04 DIAGNOSIS — I10 Essential (primary) hypertension: Secondary | ICD-10-CM | POA: Diagnosis present

## 2019-09-04 DIAGNOSIS — F329 Major depressive disorder, single episode, unspecified: Secondary | ICD-10-CM | POA: Diagnosis present

## 2019-09-04 DIAGNOSIS — E785 Hyperlipidemia, unspecified: Secondary | ICD-10-CM | POA: Diagnosis present

## 2019-09-04 DIAGNOSIS — Z9861 Coronary angioplasty status: Secondary | ICD-10-CM

## 2019-09-04 DIAGNOSIS — I25110 Atherosclerotic heart disease of native coronary artery with unstable angina pectoris: Secondary | ICD-10-CM | POA: Diagnosis not present

## 2019-09-04 DIAGNOSIS — I712 Thoracic aortic aneurysm, without rupture: Secondary | ICD-10-CM | POA: Diagnosis not present

## 2019-09-04 DIAGNOSIS — I2510 Atherosclerotic heart disease of native coronary artery without angina pectoris: Secondary | ICD-10-CM | POA: Diagnosis not present

## 2019-09-04 DIAGNOSIS — Z955 Presence of coronary angioplasty implant and graft: Secondary | ICD-10-CM

## 2019-09-04 DIAGNOSIS — E1165 Type 2 diabetes mellitus with hyperglycemia: Secondary | ICD-10-CM | POA: Diagnosis present

## 2019-09-04 DIAGNOSIS — Z7982 Long term (current) use of aspirin: Secondary | ICD-10-CM | POA: Diagnosis not present

## 2019-09-04 DIAGNOSIS — Z794 Long term (current) use of insulin: Secondary | ICD-10-CM | POA: Diagnosis not present

## 2019-09-04 DIAGNOSIS — Z79899 Other long term (current) drug therapy: Secondary | ICD-10-CM

## 2019-09-04 DIAGNOSIS — Z66 Do not resuscitate: Secondary | ICD-10-CM | POA: Diagnosis not present

## 2019-09-04 DIAGNOSIS — I200 Unstable angina: Secondary | ICD-10-CM | POA: Diagnosis not present

## 2019-09-04 DIAGNOSIS — R778 Other specified abnormalities of plasma proteins: Secondary | ICD-10-CM | POA: Diagnosis not present

## 2019-09-04 DIAGNOSIS — R0789 Other chest pain: Secondary | ICD-10-CM | POA: Diagnosis not present

## 2019-09-04 DIAGNOSIS — Z743 Need for continuous supervision: Secondary | ICD-10-CM | POA: Diagnosis not present

## 2019-09-04 DIAGNOSIS — E669 Obesity, unspecified: Secondary | ICD-10-CM | POA: Diagnosis present

## 2019-09-04 DIAGNOSIS — Z6825 Body mass index (BMI) 25.0-25.9, adult: Secondary | ICD-10-CM

## 2019-09-04 DIAGNOSIS — Z90710 Acquired absence of both cervix and uterus: Secondary | ICD-10-CM

## 2019-09-04 DIAGNOSIS — R079 Chest pain, unspecified: Secondary | ICD-10-CM | POA: Diagnosis not present

## 2019-09-04 DIAGNOSIS — Z9049 Acquired absence of other specified parts of digestive tract: Secondary | ICD-10-CM

## 2019-09-04 DIAGNOSIS — Z90722 Acquired absence of ovaries, bilateral: Secondary | ICD-10-CM

## 2019-09-04 LAB — BASIC METABOLIC PANEL
Anion gap: 8 (ref 5–15)
BUN: 10 mg/dL (ref 8–23)
CO2: 24 mmol/L (ref 22–32)
Calcium: 8.6 mg/dL — ABNORMAL LOW (ref 8.9–10.3)
Chloride: 105 mmol/L (ref 98–111)
Creatinine, Ser: 0.7 mg/dL (ref 0.44–1.00)
GFR calc Af Amer: >60 mL/min (ref >60)
GFR calc non Af Amer: >60 mL/min (ref >60)
Glucose, Bld: 369 mg/dL — ABNORMAL HIGH (ref 70–99)
Potassium: 3.9 mmol/L (ref 3.5–5.1)
Sodium: 137 mmol/L (ref 135–145)

## 2019-09-04 LAB — HEPATIC FUNCTION PANEL
ALT: 14 U/L (ref 0–44)
AST: 20 U/L (ref 15–41)
Albumin: 3.4 g/dL — ABNORMAL LOW (ref 3.5–5.0)
Alkaline Phosphatase: 74 U/L (ref 38–126)
Bilirubin, Direct: 0.2 mg/dL (ref 0.0–0.2)
Indirect Bilirubin: 0.6 mg/dL (ref 0.3–0.9)
Total Bilirubin: 0.8 mg/dL (ref 0.3–1.2)
Total Protein: 6.4 g/dL — ABNORMAL LOW (ref 6.5–8.1)

## 2019-09-04 LAB — GLUCOSE, CAPILLARY: Glucose-Capillary: 201 mg/dL — ABNORMAL HIGH (ref 70–99)

## 2019-09-04 LAB — CBC
HCT: 40.5 % (ref 36.0–46.0)
HCT: 39.1 % (ref 36.0–46.0)
Hemoglobin: 13.6 g/dL (ref 12.0–15.0)
Hemoglobin: 13.1 g/dL (ref 12.0–15.0)
MCH: 29.9 pg (ref 26.0–34.0)
MCH: 29.2 pg (ref 26.0–34.0)
MCHC: 33.6 g/dL (ref 30.0–36.0)
MCHC: 33.5 g/dL (ref 30.0–36.0)
MCV: 89 fL (ref 80.0–100.0)
MCV: 87.1 fL (ref 80.0–100.0)
Platelets: 213 10*3/uL (ref 150–400)
Platelets: 219 10*3/uL (ref 150–400)
RBC: 4.55 MIL/uL (ref 3.87–5.11)
RBC: 4.49 MIL/uL (ref 3.87–5.11)
RDW: 12.3 % (ref 11.5–15.5)
RDW: 12.2 % (ref 11.5–15.5)
WBC: 10.3 10*3/uL (ref 4.0–10.5)
WBC: 10.9 10*3/uL — ABNORMAL HIGH (ref 4.0–10.5)
nRBC: 0 % (ref 0.0–0.2)
nRBC: 0 % (ref 0.0–0.2)

## 2019-09-04 LAB — PROTIME-INR
INR: 1 (ref 0.8–1.2)
Prothrombin Time: 13 seconds (ref 11.4–15.2)

## 2019-09-04 LAB — APTT: aPTT: 31 seconds (ref 24–36)

## 2019-09-04 LAB — TROPONIN I (HIGH SENSITIVITY)
Troponin I (High Sensitivity): 6 ng/L (ref <18)
Troponin I (High Sensitivity): 23 ng/L — ABNORMAL HIGH (ref <18)

## 2019-09-04 MED ORDER — NITROGLYCERIN 2 % TD OINT
1.0000 [in_us] | TOPICAL_OINTMENT | Freq: Once | TRANSDERMAL | Status: AC
  Administered 2019-09-04: 1 [in_us] via TOPICAL
  Filled 2019-09-04: qty 1

## 2019-09-04 MED ORDER — NITROGLYCERIN IN D5W 200-5 MCG/ML-% IV SOLN
0.0000 ug/min | INTRAVENOUS | Status: DC
  Administered 2019-09-04: 5 ug/min via INTRAVENOUS
  Filled 2019-09-04: qty 250

## 2019-09-04 MED ORDER — INSULIN ASPART 100 UNIT/ML ~~LOC~~ SOLN: 8.0000 [IU] | Freq: Once | SUBCUTANEOUS | Status: DC

## 2019-09-04 MED ORDER — INSULIN GLARGINE 100 UNIT/ML ~~LOC~~ SOLN
20.0000 [IU] | Freq: Every day | SUBCUTANEOUS | Status: DC
  Administered 2019-09-04 – 2019-09-05 (×2): 20 [IU] via SUBCUTANEOUS
  Filled 2019-09-04 (×4): qty 0.2

## 2019-09-04 MED ORDER — ASPIRIN EC 81 MG PO TBEC: 81.0000 mg | DELAYED_RELEASE_TABLET | Freq: Every day | ORAL | Status: DC

## 2019-09-04 MED ORDER — LABETALOL HCL 5 MG/ML IV SOLN
5.0000 mg | Freq: Once | INTRAVENOUS | Status: AC
  Administered 2019-09-04: 5 mg via INTRAVENOUS
  Filled 2019-09-04: qty 4

## 2019-09-04 MED ORDER — CARVEDILOL 12.5 MG PO TABS
12.5000 mg | ORAL_TABLET | Freq: Two times a day (BID) | ORAL | Status: DC
  Administered 2019-09-05 (×2): 12.5 mg via ORAL
  Filled 2019-09-04 (×2): qty 2

## 2019-09-04 MED ORDER — INSULIN ASPART 100 UNIT/ML ~~LOC~~ SOLN
0.0000 [IU] | Freq: Three times a day (TID) | SUBCUTANEOUS | Status: DC
  Administered 2019-09-05: 5 [IU] via SUBCUTANEOUS
  Administered 2019-09-05: 11 [IU] via SUBCUTANEOUS
  Administered 2019-09-06: 5 [IU] via SUBCUTANEOUS
  Filled 2019-09-04 (×3): qty 1

## 2019-09-04 MED ORDER — NITROGLYCERIN IN D5W 200-5 MCG/ML-% IV SOLN
0.0000 ug/min | INTRAVENOUS | Status: DC
  Administered 2019-09-04: 10 ug/min via INTRAVENOUS

## 2019-09-04 MED ORDER — ALPRAZOLAM 0.25 MG PO TABS
0.2500 mg | ORAL_TABLET | Freq: Two times a day (BID) | ORAL | Status: DC | PRN
  Administered 2019-09-05: 0.25 mg via ORAL
  Filled 2019-09-04: qty 1

## 2019-09-04 MED ORDER — LOSARTAN POTASSIUM 50 MG PO TABS
100.0000 mg | ORAL_TABLET | Freq: Every day | ORAL | Status: DC
  Administered 2019-09-04 – 2019-09-06 (×3): 100 mg via ORAL
  Filled 2019-09-04 (×3): qty 2

## 2019-09-04 MED ORDER — ONDANSETRON HCL 4 MG/2ML IJ SOLN
4.0000 mg | Freq: Four times a day (QID) | INTRAMUSCULAR | Status: DC | PRN
  Administered 2019-09-05: 4 mg via INTRAVENOUS
  Filled 2019-09-04: qty 2

## 2019-09-04 MED ORDER — IOHEXOL 350 MG/ML SOLN
100.0000 mL | Freq: Once | INTRAVENOUS | Status: AC | PRN
  Administered 2019-09-04: 100 mL via INTRAVENOUS

## 2019-09-04 MED ORDER — ONDANSETRON HCL 4 MG/2ML IJ SOLN
4.0000 mg | Freq: Once | INTRAMUSCULAR | Status: AC
  Administered 2019-09-04: 4 mg via INTRAVENOUS
  Filled 2019-09-04: qty 2

## 2019-09-04 MED ORDER — ATORVASTATIN CALCIUM 20 MG PO TABS
40.0000 mg | ORAL_TABLET | Freq: Every day | ORAL | Status: DC
  Administered 2019-09-04 – 2019-09-06 (×3): 40 mg via ORAL
  Filled 2019-09-04 (×3): qty 2

## 2019-09-04 MED ORDER — HEPARIN BOLUS VIA INFUSION
4000.0000 [IU] | Freq: Once | INTRAVENOUS | Status: AC
  Administered 2019-09-04: 4000 [IU] via INTRAVENOUS
  Filled 2019-09-04: qty 4000

## 2019-09-04 MED ORDER — HEPARIN (PORCINE) 25000 UT/250ML-% IV SOLN
1200.0000 [IU]/h | INTRAVENOUS | Status: DC
  Administered 2019-09-04: 950 [IU]/h via INTRAVENOUS
  Administered 2019-09-05: 1200 [IU]/h via INTRAVENOUS
  Filled 2019-09-04 (×3): qty 250

## 2019-09-04 MED ORDER — ASPIRIN EC 81 MG PO TBEC
81.0000 mg | DELAYED_RELEASE_TABLET | Freq: Every day | ORAL | Status: DC
  Administered 2019-09-04 – 2019-09-06 (×3): 81 mg via ORAL
  Filled 2019-09-04 (×3): qty 1

## 2019-09-04 MED ORDER — HYDROCHLOROTHIAZIDE 12.5 MG PO CAPS
12.5000 mg | ORAL_CAPSULE | Freq: Every day | ORAL | Status: DC
  Administered 2019-09-04 – 2019-09-06 (×3): 12.5 mg via ORAL
  Filled 2019-09-04 (×3): qty 1

## 2019-09-04 MED ORDER — INSULIN ASPART 100 UNIT/ML ~~LOC~~ SOLN
6.0000 [IU] | Freq: Once | SUBCUTANEOUS | Status: AC
  Administered 2019-09-04: 6 [IU] via INTRAVENOUS
  Filled 2019-09-04: qty 1

## 2019-09-04 MED ORDER — NITROGLYCERIN 0.4 MG SL SUBL
0.4000 mg | SUBLINGUAL_TABLET | SUBLINGUAL | Status: DC | PRN
  Administered 2019-09-05 (×2): 0.4 mg via SUBLINGUAL
  Filled 2019-09-04: qty 1

## 2019-09-04 MED ORDER — LOSARTAN POTASSIUM-HCTZ 100-12.5 MG PO TABS: 1.0000 | ORAL_TABLET | Freq: Every day | ORAL | Status: DC

## 2019-09-04 MED ORDER — ACETAMINOPHEN 325 MG PO TABS: 650.0000 mg | ORAL_TABLET | ORAL | Status: DC | PRN

## 2019-09-04 MED ORDER — ACETAMINOPHEN 500 MG PO TABS
1000.0000 mg | ORAL_TABLET | Freq: Once | ORAL | Status: AC
  Administered 2019-09-04: 1000 mg via ORAL
  Filled 2019-09-04: qty 2

## 2019-09-04 MED ORDER — MORPHINE SULFATE (PF) 2 MG/ML IV SOLN
2.0000 mg | INTRAVENOUS | Status: DC | PRN
  Administered 2019-09-04 – 2019-09-06 (×8): 2 mg via INTRAVENOUS
  Filled 2019-09-04 (×8): qty 1

## 2019-09-04 NOTE — ED Provider Notes (Signed)
[facility] Emergency Department Provider Note    First MD Initiated Contact with Patient 09/04/19 1726     (approximate)  I have reviewed the triage vital signs and the nursing notes.   HISTORY  Chief Complaint Chest Pain    HPI Cynthia Singh is a 79 y.o. female chest pain radiating to her shoulders and back this started this afternoon.  No associated diaphoresis.  Did take some nitroglycerin with some improvement.  States that she is still having 8 out of 10 pain.  No recent fevers.  No nausea or vomiting.  Does have a history of CAD status post stent.  Did take full dose aspirin with EMS.  Denies any cough or congestion.    Past Medical History:  Diagnosis Date  . CAD (coronary artery disease)    a. Remote nonobstructive disease but in 10/2012 Cath/PCI: s/p DES to RCA. b. cath 02/08/15 DES to prox LAD and balloon angioplasty of ost D1, EF 55-65%.  . Concussion    [place] AFTER FALL 3/18  . Depression   . Diabetes mellitus, type 2 ([place])   . Dyslipidemia   . Episodic mood disorder ([place])   . Hypertension   . Myocardial infarction ([place])    2014  . Obesity   . Osteoarthritis of spine    knees also  . Peripheral vascular disease, unspecified ([place]) 2020   severe disease on home screening by insurance   Family History  Problem Relation Age of Onset  . Cancer Brother        Colon  . Coronary artery disease Neg Hx   . Heart attack Neg Hx    Past Surgical History:  Procedure Laterality Date  . ABDOMINAL HYSTERECTOMY    . APPENDECTOMY  1959  . CARDIAC CATHETERIZATION N/A 02/08/2015   Procedure: Left Heart Cath and Coronary Angiography;  Surgeon: Sherren Mocha, MD;  Location: [place] [facility];  Service: Cardiovascular;  Laterality: N/A;  . CARDIAC CATHETERIZATION N/A 02/08/2015   Procedure: Coronary Stent Intervention;  Surgeon: Sherren Mocha, MD;  Location: [place] [facility];  Service: Cardiovascular;  Laterality: N/A;  . CARDIAC CATHETERIZATION  N/A 02/08/2015   Procedure: Intravascular Pressure Wire/FFR Study;  Surgeon: Sherren Mocha, MD;  Location: [place] [facility];  Service: Cardiovascular;  Laterality: N/A;  . CATARACT EXTRACTION W/PHACO Left 11/10/2016   Procedure: CATARACT EXTRACTION PHACO AND INTRAOCULAR LENS PLACEMENT ([place]) suture placed in left eye at end of procedure;  Surgeon: Birder Robson, MD;  Location: [facility];  Service: Ophthalmology;  Laterality: Left;  [place]  3.20 AP% 27.7 CDE 55.63 Fluid pack lot # [id number]  . CHOLECYSTECTOMY    . COMBINED HYSTERECTOMY ABDOMINAL W/ A&P REPAIR / OOPHORECTOMY  1968  . CORONARY ANGIOPLASTY     STENTS X 5  . CORONARY STENT PLACEMENT  2014  . DILATION AND CURETTAGE OF UTERUS    . [place]  . LEFT HEART CATHETERIZATION WITH CORONARY ANGIOGRAM N/A 10/20/2012   Procedure: LEFT HEART CATHETERIZATION WITH CORONARY ANGIOGRAM;  Surgeon: Sherren Mocha, MD;  Location: [facility] CATH LAB;  Service: Cardiovascular;  Laterality: N/A;  . multiple D&C    . TONSILLECTOMY  age 24   Patient Active Problem List   Diagnosis Date Noted  . Hypertensive urgency 09/04/2019  . Vaginal discomfort 06/14/2019  . Peripheral vascular disease, unspecified ([place]) 2020  . Narcotic dependence ([place]) 10/29/2016  . CAD S/P percutaneous coronary angioplasty/DES (RCA-2014/LAD-2016) 07/19/2016  . Preventative health care 04/30/2014  .  Advanced directives, counseling/discussion 04/30/2014  . Unstable angina ([place]) 10/21/2012  . Atherosclerosis of native coronary artery with angina pectoris ([place]) 10/21/2012  . KNEE PAIN, LEFT, CHRONIC 02/25/2010  . Poorly controlled type 2 diabetes mellitus with circulatory disorder ([place]) 12/30/2009  . Hyperlipidemia 12/30/2009  . Episodic mood disorder ([place]) 12/30/2009  . Essential hypertension 12/30/2009  . Chronic back pain 12/30/2009      Prior to Admission medications   Medication Sig Start Date End Date Taking? Authorizing Provider  aspirin EC 81 MG tablet Take  81 mg by mouth daily.    [provider]  atorvastatin (LIPITOR) 80 MG tablet TAKE 1/2 TABLETS (40 MG TOTAL) BY MOUTH DAILY. 06/12/19   Venia Carbon, MD  carvedilol (COREG) 12.5 MG tablet Take 1 tablet (12.5 mg total) by mouth 2 (two) times daily with a meal. 04/07/18   [name] I, MD  clopidogrel (PLAVIX) 75 MG tablet TAKE 1 TABLET BY MOUTH EVERY DAY 05/22/19   Venia Carbon, MD  empagliflozin (JARDIANCE) 25 MG TABS tablet Take 25 mg by mouth daily before breakfast. 01/12/19   Philemon Kingdom, MD  Insulin Glargine (LANTUS SOLOSTAR) 100 UNIT/ML Solostar Pen TAKE 36 UNITS DAILY UNDER SKIN 05/22/19   [name] I, MD  Insulin Pen Needle (PEN NEEDLES) 32G X 5 MM MISC 1 Units by Does not apply route daily. 11/05/16   Venia Carbon, MD  losartan-hydrochlorothiazide (HYZAAR) 100-12.5 MG tablet Take 1 tablet by mouth daily. 04/07/18   Venia Carbon, MD  metFORMIN (GLUCOPHAGE-XR) 500 MG 24 hr tablet TAKE 2 TABLETS BY MOUTH EVERY DAY WITH BREAKFAST 10/26/18   Venia Carbon, MD  nitroGLYCERIN (NITROSTAT) 0.4 MG SL tablet Place 1 tablet (0.4 mg total) under the tongue every 5 (five) minutes as needed for chest pain. 08/24/19   Venia Carbon, MD  nortriptyline (PAMELOR) 50 MG capsule TAKE 3 CAPSULES (150 MG TOTAL) BY MOUTH AT BEDTIME. 06/09/19   Venia Carbon, MD  Omega-3 Fatty Acids (FISH OIL PO) Take 1 capsule by mouth daily.    [provider]  oxyCODONE-acetaminophen (PERCOCET) 10-325 MG tablet Take 1-2 tablets by mouth every 6 (six) hours as needed for pain. M54.9 08/09/19   Venia Carbon, MD  polyethylene glycol (MIRALAX / Floria Raveling) packet Take 17 g by mouth daily.    [provider]    Allergies Patient has no known allergies.    Social History Social History   Tobacco Use  . Smoking status: Never Smoker  . Smokeless tobacco: Never Used  Substance Use Topics  . Alcohol use: No  . Drug use: No    Review of Systems Patient denies  headaches, rhinorrhea, blurry vision, numbness, shortness of breath, chest pain, edema, cough, abdominal pain, nausea, vomiting, diarrhea, dysuria, fevers, rashes or hallucinations unless otherwise stated above in HPI. ____________________________________________   PHYSICAL EXAM:  VITAL SIGNS: Vitals:   09/04/19 1800 09/04/19 1830  BP: (!) 181/87 (!) 161/80  Pulse: 79 75  Resp: 15 (!) 22  Temp:    SpO2: 97% 96%    Constitutional: Alert and oriented.  Eyes: Conjunctivae are normal.  Head: Atraumatic. Nose: No congestion/rhinnorhea. Mouth/Throat: Mucous membranes are moist.   Neck: No stridor. Painless ROM.  Cardiovascular: Normal rate, regular rhythm. Grossly normal heart sounds.  Good peripheral circulation. Respiratory: Normal respiratory effort.  No retractions. Lungs CTAB. Gastrointestinal: Soft and nontender. No distention. No abdominal bruits. No CVA tenderness. Genitourinary:  Musculoskeletal: No lower extremity tenderness nor edema.  No joint effusions. Neurologic:  Normal speech and language. No gross focal neurologic deficits are appreciated. No facial droop Skin:  Skin is warm, dry and intact. No rash noted. Psychiatric: Mood and affect are normal. Speech and behavior are normal.  ____________________________________________   LABS (all labs ordered are listed, but only abnormal results are displayed)  Results for orders placed or performed during the hospital encounter of 09/04/19 (from the past 24 hour(s))  Basic metabolic panel     Status: Abnormal   Collection Time: 09/04/19  5:26 PM  Result Value Ref Range   Sodium 137 135 - 145 mmol/L   Potassium 3.9 3.5 - 5.1 mmol/L   Chloride 105 98 - 111 mmol/L   CO2 24 22 - 32 mmol/L   Glucose, Bld 369 (H) 70 - 99 mg/dL   BUN 10 8 - 23 mg/dL   Creatinine, Ser 0.70 0.44 - 1.00 mg/dL   Calcium 8.6 (L) 8.9 - 10.3 mg/dL   GFR calc non Af Amer >60 >60 mL/min   GFR calc Af Amer >60 >60 mL/min   Anion gap 8 5 - 15  CBC      Status: None   Collection Time: 09/04/19  5:26 PM  Result Value Ref Range   WBC 10.3 4.0 - 10.5 K/uL   RBC 4.55 3.87 - 5.11 MIL/uL   Hemoglobin 13.6 12.0 - 15.0 g/dL   HCT 40.5 36.0 - 46.0 %   MCV 89.0 80.0 - 100.0 fL   MCH 29.9 26.0 - 34.0 pg   MCHC 33.6 30.0 - 36.0 g/dL   RDW 12.3 11.5 - 15.5 %   Platelets 213 150 - 400 K/uL   nRBC 0.0 0.0 - 0.2 %  Troponin I (High Sensitivity)     Status: None   Collection Time: 09/04/19  5:26 PM  Result Value Ref Range   Troponin I (High Sensitivity) 6 <18 ng/L  Hepatic function panel     Status: Abnormal   Collection Time: 09/04/19  5:26 PM  Result Value Ref Range   Total Protein 6.4 (L) 6.5 - 8.1 g/dL   Albumin 3.4 (L) 3.5 - 5.0 g/dL   AST 20 15 - 41 U/L   ALT 14 0 - 44 U/L   Alkaline Phosphatase 74 38 - 126 U/L   Total Bilirubin 0.8 0.3 - 1.2 mg/dL   Bilirubin, Direct 0.2 0.0 - 0.2 mg/dL   Indirect Bilirubin 0.6 0.3 - 0.9 mg/dL  Troponin I (High Sensitivity)     Status: Abnormal   Collection Time: 09/04/19  7:30 PM  Result Value Ref Range   Troponin I (High Sensitivity) 23 (H) <18 ng/L   ____________________________________________  EKG My review and personal interpretation at Time: 17:17   Indication: chest pain  Rate: 85  Rhythm: sinus Axis: normal Other: nonspecific st abnormality,  Borderline elevation in III which may be related to motion artifact and depressions in lateral leads - will repeat    My review and personal interpretation at Time: 17:55   Indication: chest pain  Rate: 85  Rhythm: sinus Axis: normal Other: Interval improvement in lead III.  Persistent nonspecific ST changes.  No STEMI criteria.   ____________________________________________  RADIOLOGY  I personally reviewed all radiographic images ordered to evaluate for the above acute complaints and reviewed radiology reports and findings.  These findings were personally discussed with the patient.  Please see medical record for radiology  report.  ____________________________________________   PROCEDURES  Procedure(s) performed:  .Critical Care  Performed by: Merlyn Lot, MD Authorized by: Merlyn Lot, MD   Critical care provider statement:    Critical care time (minutes):  35   Critical care time was exclusive of:  Separately billable procedures and treating other patients   Critical care was necessary to treat or prevent imminent or life-threatening deterioration of the following conditions:  Cardiac failure   Critical care was time spent personally by me on the following activities:  Development of treatment plan with patient or surrogate, discussions with consultants, evaluation of patient's response to treatment, examination of patient, obtaining history from patient or surrogate, ordering and performing treatments and interventions, ordering and review of laboratory studies, ordering and review of radiographic studies, pulse oximetry, re-evaluation of patient's condition and review of old charts .1-3 Lead EKG Interpretation Performed by: Merlyn Lot, MD Authorized by: Merlyn Lot, MD     Interpretation: abnormal     ECG rate:  80   ECG rate assessment: normal     Rhythm: sinus rhythm     Ectopy: none     Conduction: normal        Critical Care performed: yes ____________________________________________   INITIAL IMPRESSION / ASSESSMENT AND PLAN / ED COURSE  Pertinent labs & imaging results that were available during my care of the patient were reviewed by me and considered in my medical decision making (see chart for details).   DDX: ACS, pericarditis, esophagitis, boerhaaves, pe, dissection, pna, bronchitis, costochondritis   Cynthia Singh is a 79 y.o. who presents to the ED with discomfort as described above.  She is afebrile hypertensive and appears uncomfortable.  Will give pain medication.  Her stat EKG here in the ER does show some concerning features but does not meet  STEMI criteria.  Will order repeat EKG.  Do want a give her some nitrate control blood pressure is a suspect this might be strain related pattern.  Given the pain radiating through the back I plan to order CT angiogram.  The patient will be placed on continuous pulse oximetry and telemetry for monitoring.  Laboratory evaluation will be sent to evaluate for the above complaints.     Clinical Course as of Sep 04 2107  Mon Sep 04, 2019  1759 Repeat EKG with persistent nonspecific changes but no clear STEMI criteria.  Case discussed in consultation with Dr. Harrington Challenger of cardiology agrees with plan for work-up and probable admission for chest pain unstable angina.   [PR]  2049 CT imaging reviewed.  Troponin is rising does have a significant CAD.  Still having some discomfort therefore was started on nitro drip for pain control.  May simply be hypertensive urgency and strain.  Will heparinize.  She did get a full dose of aspirin.  Will discuss with hospitalist for admission.   [PR]    Clinical Course User Index [PR] Merlyn Lot, MD    The patient was evaluated in Emergency Department today for the symptoms described in the history of present illness. He/she was evaluated in the context of the global COVID-19 pandemic, which necessitated consideration that the patient might be at risk for infection with the SARS-CoV-2 virus that causes COVID-19. Institutional protocols and algorithms that pertain to the evaluation of patients at risk for COVID-19 are in a state of rapid change based on information released by regulatory bodies including the CDC and federal and state organizations. These policies and algorithms were followed during the patient's care in the ED.  As part of my medical decision making,  I reviewed the following data within the [place] notes reviewed and incorporated, Labs reviewed, notes from prior ED visits and [place] Controlled Substance  Database   ____________________________________________   FINAL CLINICAL IMPRESSION(S) / ED DIAGNOSES  Final diagnoses:  Unstable angina ([place])      NEW MEDICATIONS STARTED DURING THIS VISIT:  New Prescriptions   No medications on file     Note:  This document was prepared using Dragon voice recognition software and may include unintentional dictation errors.    Merlyn Lot, MD 09/04/19 2109

## 2019-09-04 NOTE — Consult Note (Signed)
ANTICOAGULATION CONSULT NOTE - Follow Up Consult  Pharmacy Consult for Heparin Infusion  Indication: chest pain/ACS  No Known Allergies  Patient Measurements: Height: 5\' 8"  (172.7 cm) Weight: 85.7 kg (189 lb) IBW/kg (Calculated) : 63.9 Heparin Dosing Weight: 81kg  Vital Signs: Temp: 97.9 F (36.6 C) (04/19 1719) Temp Source: Oral (04/19 1719) BP: 161/80 (04/19 1830) Pulse Rate: 75 (04/19 1830)  Labs: Recent Labs    09/04/19 1726 09/04/19 1930  HGB 13.6  --   HCT 40.5  --   PLT 213  --   CREATININE 0.70  --   TROPONINIHS 6 23*    Estimated Creatinine Clearance: 68.6 mL/min (by C-G formula based on SCr of 0.7 mg/dL).   Assessment: Pharmacy consulted for heparin infusion dosing and monitoring in 79 yo female with chest pain/ACS  Goal of Therapy:  Heparin level 0.3-0.7 units/ml Monitor platelets by anticoagulation protocol: Yes   Plan:  Baseline labs ordered.  Give 4000 units bolus x 1 Start heparin infusion at 950 units/hr Check anti-Xa level in 8 hours and daily while on heparin Continue to monitor H&H and platelets  Pernell Dupre, PharmD, BCPS Clinical Pharmacist 09/04/2019 9:02 PM

## 2019-09-04 NOTE — ED Triage Notes (Signed)
PT arrived via [facility] from home with CP radiating to her left arm. Took nitro and 324 of asa at home. hx of MI, cardiac stents, and htn.

## 2019-09-04 NOTE — H&P (Signed)
History and Physical    [name] [id number] DOB: 1941/04/25 DOA: 09/04/2019  PCP: Venia Carbon, MD   Patient coming from: Home  I have personally briefly reviewed patient's old medical records in [place]  Chief Complaint: Chest pain  HPI: Cynthia Singh is a 79 y.o. female with medical history significant for CAD with history of stents to the RCA 2014, LAD in 2016 as well as history of diabetes and hypertension who presents to the emergency room with severe midsternal chest pain 10/10 radiating to her shoulders and back that started a couple hours prior to arrival.  She denies nausea vomiting or diaphoresis she took home aspirin and nitroglycerin with some improvement but continues to have pain 8 out of 10.  She denies cough fever or chills and denies shortness of breath.  ED Course: On arrival in the emergency room she was markedly hypertensive at 196/90 with otherwise normal vitals.  EKG showed possible ST depression in lead III which improved with repeat EKG.  Troponin 6>>23.  Patient had a CTA of the aorta which was negative for dissection but did show extensive coronary artery disease.  In the emergency room provider consulted with Dr. Harrington Challenger of [facility].  Patient was started on nitroglycerin drip for pain control and also started on IV heparin.  Hospitalist consulted for admission.  Of note other blood work significant for elevated blood sugar of 369 but was for the most part unremarkable.pain down to 4/10 and in left arm only by the time of admission  Review of Systems: As per HPI otherwise 10 point review of systems negative.    Past Medical History:  Diagnosis Date  . CAD (coronary artery disease)    a. Remote nonobstructive disease but in 10/2012 Cath/PCI: s/p DES to RCA. b. cath 02/08/15 DES to prox LAD and balloon angioplasty of ost D1, EF 55-65%.  . Concussion    Rachel AFTER FALL 3/18  . Depression   . Diabetes mellitus, type 2 ([place])   . Dyslipidemia   .  Episodic mood disorder ([place])   . Hypertension   . Myocardial infarction ([place])    2014  . Obesity   . Osteoarthritis of spine    knees also  . Peripheral vascular disease, unspecified ([place]) 2020   severe disease on home screening by insurance    Past Surgical History:  Procedure Laterality Date  . ABDOMINAL HYSTERECTOMY    . APPENDECTOMY  1959  . CARDIAC CATHETERIZATION N/A 02/08/2015   Procedure: Left Heart Cath and Coronary Angiography;  Surgeon: Sherren Mocha, MD;  Location: [place] [facility];  Service: Cardiovascular;  Laterality: N/A;  . CARDIAC CATHETERIZATION N/A 02/08/2015   Procedure: Coronary Stent Intervention;  Surgeon: Sherren Mocha, MD;  Location: [place] CV LAB;  Service: Cardiovascular;  Laterality: N/A;  . CARDIAC CATHETERIZATION N/A 02/08/2015   Procedure: Intravascular Pressure Wire/FFR Study;  Surgeon: Sherren Mocha, MD;  Location: [place] CV LAB;  Service: Cardiovascular;  Laterality: N/A;  . CATARACT EXTRACTION W/PHACO Left 11/10/2016   Procedure: CATARACT EXTRACTION PHACO AND INTRAOCULAR LENS PLACEMENT ([place]) suture placed in left eye at end of procedure;  Surgeon: Birder Robson, MD;  Location: [facility] ORS;  Service: Ophthalmology;  Laterality: Left;  [place]  3.20 AP% 27.7 CDE 55.63 Fluid pack lot # [id number]  . CHOLECYSTECTOMY    . COMBINED HYSTERECTOMY ABDOMINAL W/ A&P REPAIR / OOPHORECTOMY  1968  . CORONARY ANGIOPLASTY     STENTS X 5  .  CORONARY STENT PLACEMENT  2014  . DILATION AND CURETTAGE OF UTERUS    . Panacea  . LEFT HEART CATHETERIZATION WITH CORONARY ANGIOGRAM N/A 10/20/2012   Procedure: LEFT HEART CATHETERIZATION WITH CORONARY ANGIOGRAM;  Surgeon: Sherren Mocha, MD;  Location: [facility] C[facility] LAB;  Service: Cardiovascular;  Laterality: N/A;  . multiple D&C    . TONSILLECTOMY  age 6     reports that she has never smoked. She has never used smokeless tobacco. She reports that she does not drink alcohol or use drugs.  No Known  Allergies  Family History  Problem Relation Age of Onset  . Cancer Brother        Colon  . Coronary artery disease Neg Hx   . Heart attack Neg Hx      Prior to Admission medications   Medication Sig Start Date End Date Taking? Authorizing Provider  aspirin EC 81 MG tablet Take 81 mg by mouth daily.    [provider]  atorvastatin (LIPITOR) 80 MG tablet TAKE 1/2 TABLETS (40 MG TOTAL) BY MOUTH DAILY. 06/12/19   Venia Carbon, MD  carvedilol (COREG) 12.5 MG tablet Take 1 tablet (12.5 mg total) by mouth 2 (two) times daily with a meal. 04/07/18   [name] I, MD  clopidogrel (PLAVIX) 75 MG tablet TAKE 1 TABLET BY MOUTH EVERY DAY 05/22/19   Venia Carbon, MD  empagliflozin (JARDIANCE) 25 MG TABS tablet Take 25 mg by mouth daily before breakfast. 01/12/19   Philemon Kingdom, MD  Insulin Glargine (LANTUS SOLOSTAR) 100 UNIT/ML Solostar Pen TAKE 36 UNITS DAILY UNDER SKIN 05/22/19   [name] I, MD  Insulin Pen Needle (PEN NEEDLES) 32G X 5 MM MISC 1 Units by Does not apply route daily. 11/05/16   Venia Carbon, MD  losartan-hydrochlorothiazide (HYZAAR) 100-12.5 MG tablet Take 1 tablet by mouth daily. 04/07/18   Venia Carbon, MD  metFORMIN (GLUCOPHAGE-XR) 500 MG 24 hr tablet TAKE 2 TABLETS BY MOUTH EVERY DAY WITH BREAKFAST 10/26/18   Venia Carbon, MD  nitroGLYCERIN (NITROSTAT) 0.4 MG SL tablet Place 1 tablet (0.4 mg total) under the tongue every 5 (five) minutes as needed for chest pain. 08/24/19   Venia Carbon, MD  nortriptyline (PAMELOR) 50 MG capsule TAKE 3 CAPSULES (150 MG TOTAL) BY MOUTH AT BEDTIME. 06/09/19   Venia Carbon, MD  Omega-3 Fatty Acids (FISH OIL PO) Take 1 capsule by mouth daily.    [provider]  oxyCODONE-acetaminophen (PERCOCET) 10-325 MG tablet Take 1-2 tablets by mouth every 6 (six) hours as needed for pain. M54.9 08/09/19   Venia Carbon, MD  polyethylene glycol (MIRALAX / Floria Raveling) packet Take 17 g by mouth daily.     [provider]    Physical Exam: Vitals:   09/04/19 1930 09/04/19 2030 09/04/19 2100 09/04/19 2110  BP: (!) 184/96  (!) 142/71 (!) 154/80  Pulse: 78 72 72 74  Resp: 19 16 (!) 28 17  Temp:      TempSrc:      SpO2: 97% 95% 95% 95%  Weight:      Height:         Vitals:   09/04/19 1930 09/04/19 2030 09/04/19 2100 09/04/19 2110  BP: (!) 184/96  (!) 142/71 (!) 154/80  Pulse: 78 72 72 74  Resp: 19 16 (!) 28 17  Temp:      TempSrc:      SpO2: 97% 95% 95% 95%  Weight:  Height:        Constitutional: Alert and awake, oriented x3, not in any acute distress. Eyes: PERLA, EOMI, irises appear normal, anicteric sclera,  ENMT: external ears and nose appear normal, normal hearing             Lips appears normal, oropharynx mucosa, tongue, posterior pharynx appear normal  Neck: neck appears normal, no masses, normal ROM, no thyromegaly, no JVD  CVS: S1-S2 clear, no murmur rubs or gallops,  , no carotid bruits, pedal pulses palpable, No LE edema Respiratory:  clear to auscultation bilaterally, no wheezing, rales or rhonchi. Respiratory effort normal. No accessory muscle use.  Abdomen: soft nontender, nondistended, normal bowel sounds, no hepatosplenomegaly, no hernias Musculoskeletal: : no cyanosis, clubbing , no contractures or atrophy Neuro: Cranial nerves II-XII intact, sensation, reflexes normal, strength Psych: judgement and insight appear normal, stable mood and affect,  Skin: no rashes or lesions or ulcers, no induration or nodules   Labs on Admission: I have personally reviewed following labs and imaging studies  CBC: Recent Labs  Lab 09/04/19 1726  WBC 10.3  HGB 13.6  HCT 40.5  MCV 89.0  PLT 123456   Basic Metabolic Panel: Recent Labs  Lab 09/04/19 1726  NA 137  K 3.9  CL 105  CO2 24  GLUCOSE 369*  BUN 10  CREATININE 0.70  CALCIUM 8.6*   GFR: Estimated Creatinine Clearance: 68.6 mL/min (by C-G formula based on SCr of 0.7 mg/dL). Liver Function  Tests: Recent Labs  Lab 09/04/19 1726  AST 20  ALT 14  ALKPHOS 74  BILITOT 0.8  PROT 6.4*  ALBUMIN 3.4*   No results for input(s): LIPASE, AMYLASE in the last 168 hours. No results for input(s): AMMONIA in the last 168 hours. Coagulation Profile: No results for input(s): INR, PROTIME in the last 168 hours. Cardiac Enzymes: No results for input(s): CKTOTAL, CKMB, CKMBINDEX, TROPONINI in the last 168 hours. BNP (last 3 results) No results for input(s): PROBNP in the last 8760 hours. HbA1C: No results for input(s): HGBA1C in the last 72 hours. CBG: No results for input(s): GLUCAP in the last 168 hours. Lipid Profile: No results for input(s): CHOL, HDL, LDLCALC, TRIG, CHOLHDL, LDLDIRECT in the last 72 hours. Thyroid Function Tests: No results for input(s): TSH, T4TOTAL, FREET4, T3FREE, THYROIDAB in the last 72 hours. Anemia Panel: No results for input(s): VITAMINB12, FOLATE, FERRITIN, TIBC, IRON, RETICCTPCT in the last 72 hours. Urine analysis:    Component Value Date/Time   COLORURINE YELLOW (A) 06/22/2017 1129   APPEARANCEUR CLEAR (A) 06/22/2017 1129   LABSPEC 1.014 06/22/2017 1129   PHURINE 5.0 06/22/2017 1129   GLUCOSEU >=500 (A) 06/22/2017 1129   HGBUR NEGATIVE 06/22/2017 1129   BILIRUBINUR neg 06/14/2019 1408   KETONESUR 5 (A) 06/22/2017 1129   PROTEINUR Negative 06/14/2019 1408   PROTEINUR NEGATIVE 06/22/2017 1129   UROBILINOGEN 0.2 06/14/2019 1408   UROBILINOGEN 1.0 10/19/2012 1030   NITRITE neg 06/14/2019 1408   NITRITE NEGATIVE 06/22/2017 1129   LEUKOCYTESUR Negative 06/14/2019 1408    Radiological Exams on Admission: CT Head Wo Contrast  Result Date: 09/04/2019 CLINICAL DATA:  Chest pain radiating to the left arm. History of subdural hematoma previously. EXAM: CT HEAD WITHOUT CONTRAST TECHNIQUE: Contiguous axial images were obtained from the base of the skull through the vertex without intravenous contrast. COMPARISON:  06/22/2017 FINDINGS: Brain: Mild age  related volume loss. No evidence of old or acute infarction, mass lesion, hemorrhage, hydrocephalus or extra-axial collection. Vascular: There  is atherosclerotic calcification of the major vessels at the base of the brain. Skull: Negative Sinuses/Orbits: Clear/normal Other: None IMPRESSION: No acute finding by CT. Mild age related volume loss. Atherosclerotic calcification of the major vessels at the base of the brain. Electronically Signed   By: Nelson Chimes M.D.   On: 09/04/2019 20:40   DG Chest Portable 1 View  Result Date: 09/04/2019 CLINICAL DATA:  Chest pain radiating to the left arm. EXAM: PORTABLE CHEST 1 VIEW COMPARISON:  06/26/2016 FINDINGS: Artifact overlies the chest. Heart size within normal limits. Coronary stents. Aortic atherosclerosis. Pulmonary venous hypertension without frank edema. No infiltrate, collapse or effusion. No acute bone finding. IMPRESSION: Coronary stents. Aortic atherosclerosis. Pulmonary venous hypertension without frank edema. Electronically Signed   By: Nelson Chimes M.D.   On: 09/04/2019 18:09   CT ANGIO CHEST AORTA W/CM & OR WO/CM  Result Date: 09/04/2019 CLINICAL DATA:  Pain radiating to the back of the chest and left arm. Assess for dissection. EXAM: CT ANGIOGRAPHY CHEST WITH CONTRAST TECHNIQUE: Multidetector CT imaging of the chest was performed using the standard protocol during bolus administration of intravenous contrast. Multiplanar CT image reconstructions and MIPs were obtained to evaluate the vascular anatomy. CONTRAST:  162mL OMNIPAQUE IOHEXOL 350 MG/ML SOLN COMPARISON:  Chest radiography same day. CT 10/19/2012. FINDINGS: Cardiovascular: Heart size mildly enlarged. Extensive coronary artery calcification. There is either motion artifact or some chronic thrombus along the right lateral edge of the left atrium. Focal calcification along the left edge could be focal calcification in the mitral valve annulus or could be dystrophic muscular calcification.  Echocardiography could evaluate these findings. The ascending aorta shows a maximal diameter of 3.7 cm. No ascending aortic dissection. The descending aorta has fairly considerable soft and calcified plaque with some irregularity/ulceration. No actual dissection however. Mediastinum/Nodes: No mediastinal or hilar mass or lymphadenopathy. Lungs/Pleura: Lung parenchyma is clear. No mass, nodule, infiltrate or collapse. No pulmonary edema. No pleural effusion. Upper Abdomen: Negative Musculoskeletal: Bridging osteophytes suggesting diffuse idiopathic skeletal hyperostosis. Superior endplate depression at 624THL. Vacuum phenomenon in the disc suggests that this could be an acute or subacute superior endplate fracture. Review of the MIP images confirms the above findings. IMPRESSION: 1. No type A dissection. Intimal thickening with calcified and soft plaque in the descending thoracic aorta which I think is a manifestation of atherosclerotic disease, possibly with some ulceration, rather than a thrombosed false lumen. Ascending aorta diameter 3.7 cm Recommend annual imaging followup by CTA or MRA. This recommendation follows 2010 ACCF/AHA/AATS/ACR/ASA/SCA/SCAI/SIR/STS/SVM Guidelines for the Diagnosis and Management of Patients with Thoracic Aortic Disease. Circulation.2010; 121ML:4928372. Aortic aneurysm NOS (ICD10-I71.9) 2. Extensive coronary artery calcification. Some motion artifact or some chronic thrombus along the right lateral edge of the left atrium. Focal calcification along the left edge could be focal calcification in the mitral valve annulus or could be dystrophic muscular calcification. Echocardiography could evaluate these findings. 3. Superior endplate depression at 624THL. Vacuum phenomenon in the disc suggests that this could be an acute or subacute superior endplate fracture. Electronically Signed   By: Nelson Chimes M.D.   On: 09/04/2019 20:38    EKG: Independently reviewed.   Assessment/Plan Active  Problems:   Unstable angina (HCC)   CAD S/P percutaneous coronary angioplasty/DES (RCA-2014/LAD-2016) -Patient presented with typical chest pain retrosternal radiating to back and shoulders --CTA aorta ruled out dissecting aneurysm but patient was noted to have a 3.7 ascending aortic aneurysm -Troponin6>>23, but no EKG changes -Continue nitroglycerin infusion to titrate to  pain. -Continue heparin infusion -Continue aspirin, atorvastatin and Coreg -Cardiology, Dr. Harrington Challenger consulted -We will keep n.p.o. after midnight in the event procedure is needed    Hypertensive urgency -BP 196/90 in the emergency room -Continue home antihypertensives -As needed IV antihypertensives for control    Hyperglycemia due to type 2 diabetes mellitus ([place]) -Blood sugar of 369 in the emergency room -Continue home Lantus but at reduced dose -Sliding scale insulin coverage    DVT prophylaxis: On heparin infusion Code Status: DNR.  Discussed with son and POA Timmy Zhong Family Communication: Son, Biance Dierker Disposition Plan: Back to previous home environment Consults called: Dr. Harrington Challenger, cardiology Status:obs    Athena Masse MD [facility]     09/04/2019, 9:26 PM

## 2019-09-05 ENCOUNTER — Encounter
Admission: EM | Disposition: A | Discharge status: Home / Self Care | Payer: Self-pay | Source: Home / Self Care | Attending: Internal Medicine

## 2019-09-05 ENCOUNTER — Encounter: Payer: Self-pay | Admitting: Internal Medicine

## 2019-09-05 ENCOUNTER — Other Ambulatory Visit: Payer: Self-pay

## 2019-09-05 ENCOUNTER — Observation Stay (HOSPITAL_BASED_OUTPATIENT_CLINIC_OR_DEPARTMENT_OTHER)
Admit: 2019-09-05 | Discharge: 2019-09-05 | Disposition: A | Discharge status: Home / Self Care | Payer: Medicare Other | Attending: Internal Medicine | Admitting: Internal Medicine

## 2019-09-05 DIAGNOSIS — Z9861 Coronary angioplasty status: Secondary | ICD-10-CM | POA: Diagnosis not present

## 2019-09-05 DIAGNOSIS — I214 Non-ST elevation (NSTEMI) myocardial infarction: Secondary | ICD-10-CM | POA: Diagnosis not present

## 2019-09-05 DIAGNOSIS — E1165 Type 2 diabetes mellitus with hyperglycemia: Secondary | ICD-10-CM | POA: Diagnosis not present

## 2019-09-05 DIAGNOSIS — I160 Hypertensive urgency: Secondary | ICD-10-CM

## 2019-09-05 DIAGNOSIS — Z794 Long term (current) use of insulin: Secondary | ICD-10-CM

## 2019-09-05 DIAGNOSIS — I2510 Atherosclerotic heart disease of native coronary artery without angina pectoris: Secondary | ICD-10-CM | POA: Diagnosis not present

## 2019-09-05 LAB — GLUCOSE, CAPILLARY
Glucose-Capillary: 237 mg/dL — ABNORMAL HIGH (ref 70–99)
Glucose-Capillary: 303 mg/dL — ABNORMAL HIGH (ref 70–99)
Glucose-Capillary: 249 mg/dL — ABNORMAL HIGH (ref 70–99)

## 2019-09-05 LAB — BASIC METABOLIC PANEL
Anion gap: 8 (ref 5–15)
BUN: 10 mg/dL (ref 8–23)
CO2: 28 mmol/L (ref 22–32)
Calcium: 9.5 mg/dL (ref 8.9–10.3)
Chloride: 102 mmol/L (ref 98–111)
Creatinine, Ser: 0.72 mg/dL (ref 0.44–1.00)
GFR calc Af Amer: >60 mL/min (ref >60)
GFR calc non Af Amer: >60 mL/min (ref >60)
Glucose, Bld: 224 mg/dL — ABNORMAL HIGH (ref 70–99)
Potassium: 4 mmol/L (ref 3.5–5.1)
Sodium: 138 mmol/L (ref 135–145)

## 2019-09-05 LAB — CBC
HCT: 37.7 % (ref 36.0–46.0)
Hemoglobin: 13.3 g/dL (ref 12.0–15.0)
MCH: 29.8 pg (ref 26.0–34.0)
MCHC: 35.3 g/dL (ref 30.0–36.0)
MCV: 84.5 fL (ref 80.0–100.0)
Platelets: 213 10*3/uL (ref 150–400)
RBC: 4.46 MIL/uL (ref 3.87–5.11)
RDW: 12.4 % (ref 11.5–15.5)
WBC: 13.5 10*3/uL — ABNORMAL HIGH (ref 4.0–10.5)
nRBC: 0 % (ref 0.0–0.2)

## 2019-09-05 LAB — TROPONIN I (HIGH SENSITIVITY)
Troponin I (High Sensitivity): 182 ng/L (ref <18)
Troponin I (High Sensitivity): 111 ng/L (ref <18)

## 2019-09-05 LAB — LIPID PANEL
Cholesterol: 158 mg/dL (ref 0–200)
HDL: 35 mg/dL — ABNORMAL LOW (ref >40)
LDL Cholesterol: 62 mg/dL (ref 0–99)
Total CHOL/HDL Ratio: 4.5 RATIO
Triglycerides: 305 mg/dL — ABNORMAL HIGH (ref <150)
VLDL: 61 mg/dL — ABNORMAL HIGH (ref 0–40)

## 2019-09-05 LAB — HEPARIN LEVEL (UNFRACTIONATED)
Heparin Unfractionated: 0.29 IU/mL — ABNORMAL LOW (ref 0.30–0.70)
Heparin Unfractionated: 0.29 IU/mL — ABNORMAL LOW (ref 0.30–0.70)

## 2019-09-05 LAB — SARS CORONAVIRUS 2 (TAT 6-24 HRS): SARS Coronavirus 2: NEGATIVE

## 2019-09-05 SURGERY — LEFT HEART CATH AND CORONARY ANGIOGRAPHY
Anesthesia: Moderate Sedation

## 2019-09-05 MED ORDER — OXYCODONE-ACETAMINOPHEN 5-325 MG PO TABS
1.0000 | ORAL_TABLET | Freq: Four times a day (QID) | ORAL | Status: DC | PRN
  Administered 2019-09-06: 1 via ORAL
  Filled 2019-09-05: qty 1

## 2019-09-05 MED ORDER — PERFLUTREN LIPID MICROSPHERE
3.0000 mL | INTRAVENOUS | Status: AC | PRN
  Administered 2019-09-05: 3 mL via INTRAVENOUS
  Filled 2019-09-05: qty 4

## 2019-09-05 MED ORDER — CLOPIDOGREL BISULFATE 75 MG PO TABS
75.0000 mg | ORAL_TABLET | Freq: Every day | ORAL | Status: DC
  Administered 2019-09-05 – 2019-09-06 (×2): 75 mg via ORAL
  Filled 2019-09-05 (×2): qty 1

## 2019-09-05 MED ORDER — POLYETHYLENE GLYCOL 3350 17 G PO PACK
17.0000 g | PACK | Freq: Every day | ORAL | Status: DC
  Administered 2019-09-05: 17 g via ORAL
  Filled 2019-09-05: qty 1

## 2019-09-05 MED ORDER — NORTRIPTYLINE HCL 25 MG PO CAPS
150.0000 mg | ORAL_CAPSULE | Freq: Every day | ORAL | Status: DC
  Filled 2019-09-05 (×3): qty 6

## 2019-09-05 NOTE — Consult Note (Signed)
ANTICOAGULATION CONSULT NOTE - Follow Up Consult  Pharmacy Consult for Heparin Infusion  Indication: chest pain/ACS  No Known Allergies  Patient Measurements: Height: 5\' 8"  (172.7 cm) Weight: 85.7 kg (189 lb) IBW/kg (Calculated) : 63.9 Heparin Dosing Weight: 81kg  Vital Signs: BP: 156/89 (04/20 0530) Pulse Rate: 81 (04/20 0530)  Labs: Recent Labs    09/04/19 1726 09/04/19 1726 09/04/19 1930 09/04/19 2228 09/05/19 0549  HGB 13.6   < >  --  13.1 13.3  HCT 40.5  --   --  39.1 37.7  PLT 213  --   --  219 213  APTT  --   --   --  31  --   LABPROT  --   --   --  13.0  --   INR  --   --   --  1.0  --   HEPARINUNFRC  --   --   --   --  0.29*  CREATININE 0.70  --   --   --  0.72  TROPONINIHS 6  --  23*  --   --    < > = values in this interval not displayed.    Estimated Creatinine Clearance: 68.6 mL/min (by C-G formula based on SCr of 0.72 mg/dL).   Assessment: Pharmacy consulted for heparin infusion dosing and monitoring in 78 yo female with chest pain/ACS  Goal of Therapy:  Heparin level 0.3-0.7 units/ml Monitor platelets by anticoagulation protocol: Yes   Plan:  04/20 @ 0600 HL 0.29 subtherapeutic. Will increase rate to 1050 units/hr and will recheck HL at 1500 and will continue to monitor.  Tobie Lords, PharmD, BCPS Clinical Pharmacist 09/05/2019 6:50 AM

## 2019-09-05 NOTE — Plan of Care (Signed)
  Problem: Education: Goal: Knowledge of General Education information will improve Description: Including pain rating scale, medication(s)/side effects and non-pharmacologic comfort measures Outcome: Progressing   Problem: Pain Managment: Goal: General experience of comfort will improve Outcome: Not Progressing Note: Patient profile completed via SWOT nurse. Patient has left arm pain. Heparin gtt in place.

## 2019-09-05 NOTE — Progress Notes (Signed)
Progress Note    Cynthia Singh  [id number] DOB: 02/11/1941  DOA: 09/04/2019 PCP: Venia Carbon, MD      Brief Narrative:    Medical records reviewed and are as summarized below:  Cynthia Singh is an 79 y.o. female with medical history significant for CAD with history of stents to the RCA 2014, LAD in 2016 as well as history of diabetes and hypertension who presents to the emergency room with severe midsternal chest pain 10/10 radiating to her shoulders and back that started a couple hours prior to arrival.  She had elevated troponins.  She was admitted to the hospital for acute NSTEMI.  She was treated with IV heparin infusion and cardiologist was consulted.  Left heart cath was offered but patient declined.  Cardiologist recommended medical management.      Assessment/Plan:   Active Problems:   Unstable angina (HCC)   CAD S/P percutaneous coronary angioplasty/DES (RCA-2014/LAD-2016)   Hypertensive urgency   Hyperglycemia due to type 2 diabetes mellitus ([place])      Unstable angina/probable NSTEMI   CAD S/P percutaneous coronary angioplasty/DES (RCA-2014/LAD-2016) Plan peaked at 182.  Continue IV heparin infusion and monitor PTT per protocol.  2D echo is pending.  Patient refusing left heart cath.  Follow-up with cardiologist for further recommendations.    Hypertensive urgency BP has improved.  Continue antihypertensives.    Hyperglycemia due to type 2 diabetes mellitus (HCC) Continue Lantus and NovoLog.  For hyperglycemia.  Monitor glucose levels closely.   Body mass index is 28.74 kg/m.   Family Communication/Anticipated D/C date and plan/Code Status   DVT prophylaxis: IV heparin Code Status: Full code Family Communication: Plan discussed with patient Disposition Plan:    Status is: Observation  The patient will require care spanning > 2 midnights and should be moved to inpatient because: IV treatments appropriate due to intensity of  illness or inability to take PO  Dispo: The patient is from: Home              Anticipated d/c is to: Home              Anticipated d/c date is: 1 day              Patient currently is not medically stable to d/c.                      Subjective:   Chest pain is improved she still has some pain in her left arm although it is better overall.  She also has some pain in the back of neck.  Overall she feels better.  She does not want to have left heart cath.    Objective:    Vitals:   09/05/19 0930 09/05/19 1000 09/05/19 1030 09/05/19 1100  BP: (!) 193/95 (!) 185/92 (!) 174/100 (!) 156/94  Pulse: 83 81 85 85  Resp:    16  Temp:      TempSrc:      SpO2: 92% 96% 95% 94%  Weight:      Height:       No data found.  No intake or output data in the 24 hours ending 09/05/19 1122 Filed Weights   09/04/19 1720  Weight: 85.7 kg    Exam:  GEN: NAD SKIN: No rash EYES: EOMI ENT: MMM CV: RRR PULM: CTA B ABD: soft, ND, NT, +BS CNS: AAO x 3, non focal EXT: No edema or tenderness  Data Reviewed:   I have personally reviewed following labs and imaging studies:  Labs: Labs show the following:   Basic Metabolic Panel: Recent Labs  Lab 09/04/19 1726 09/05/19 0549  NA 137 138  K 3.9 4.0  CL 105 102  CO2 24 28  GLUCOSE 369* 224*  BUN 10 10  CREATININE 0.70 0.72  CALCIUM 8.6* 9.5   GFR Estimated Creatinine Clearance: 68.6 mL/min (by C-G formula based on SCr of 0.72 mg/dL). Liver Function Tests: Recent Labs  Lab 09/04/19 1726  AST 20  ALT 14  ALKPHOS 74  BILITOT 0.8  PROT 6.4*  ALBUMIN 3.4*   No results for input(s): LIPASE, AMYLASE in the last 168 hours. No results for input(s): AMMONIA in the last 168 hours. Coagulation profile Recent Labs  Lab 09/04/19 2228  INR 1.0    CBC: Recent Labs  Lab 09/04/19 1726 09/04/19 2228 09/05/19 0549  WBC 10.3 10.9* 13.5*  HGB 13.6 13.1 13.3  HCT 40.5 39.1 37.7  MCV 89.0 87.1 84.5  PLT 213  219 213   Cardiac Enzymes: No results for input(s): CKTOTAL, CKMB, CKMBINDEX, TROPONINI in the last 168 hours. BNP (last 3 results) No results for input(s): PROBNP in the last 8760 hours. CBG: Recent Labs  Lab 09/04/19 2225 09/05/19 0903  GLUCAP 201* 237*   D-Dimer: No results for input(s): DDIMER in the last 72 hours. Hgb A1c: No results for input(s): HGBA1C in the last 72 hours. Lipid Profile: Recent Labs    09/05/19 0549  CHOL 158  HDL 35*  LDLCALC 62  TRIG 305*  CHOLHDL 4.5   Thyroid function studies: No results for input(s): TSH, T4TOTAL, T3FREE, THYROIDAB in the last 72 hours.  Invalid input(s): FREET3 Anemia work up: No results for input(s): VITAMINB12, FOLATE, FERRITIN, TIBC, IRON, RETICCTPCT in the last 72 hours. Sepsis Labs: Recent Labs  Lab 09/04/19 1726 09/04/19 2228 09/05/19 0549  WBC 10.3 10.9* 13.5*    Microbiology Recent Results (from the past 240 hour(s))  SARS CORONAVIRUS 2 (TAT 6-24 HRS) Nasopharyngeal Nasopharyngeal Swab     Status: None   Collection Time: 09/04/19 11:27 PM   Specimen: Nasopharyngeal Swab  Result Value Ref Range Status   SARS Coronavirus 2 NEGATIVE NEGATIVE Final    Comment: (NOTE) SARS-CoV-2 target nucleic acids are NOT DETECTED. The SARS-CoV-2 RNA is generally detectable in upper and lower respiratory specimens during the acute phase of infection. Negative results do not preclude SARS-CoV-2 infection, do not rule out co-infections with other pathogens, and should not be used as the sole basis for treatment or other patient management decisions. Negative results must be combined with clinical observations, patient history, and epidemiological information. The expected result is Negative. Fact Sheet for Patients: SugarRoll.be Fact Sheet for Healthcare Providers: https://www.woods-mathews.com/ This test is not yet approved or cleared by the [place] FDA and  has been  authorized for detection and/or diagnosis of SARS-CoV-2 by FDA under an Emergency Use Authorization (EUA). This EUA will remain  in effect (meaning this test can be used) for the duration of the COVID-19 declaration under Section 56 4(b)(1) of the Act, 21 U.S.C. section 360bbb-3(b)(1), unless the authorization is terminated or revoked sooner. Performed at [facility], [place] [address] [place], [place] [address]     Procedures and diagnostic studies:  CT Head Wo Contrast  Result Date: 09/04/2019 CLINICAL DATA:  Chest pain radiating to the left arm. History of subdural hematoma previously. EXAM: CT HEAD WITHOUT CONTRAST TECHNIQUE: Contiguous axial  images were obtained from the base of the skull through the vertex without intravenous contrast. COMPARISON:  06/22/2017 FINDINGS: Brain: Mild age related volume loss. No evidence of old or acute infarction, mass lesion, hemorrhage, hydrocephalus or extra-axial collection. Vascular: There is atherosclerotic calcification of the major vessels at the base of the brain. Skull: Negative Sinuses/Orbits: Clear/normal Other: None IMPRESSION: No acute finding by CT. Mild age related volume loss. Atherosclerotic calcification of the major vessels at the base of the brain. Electronically Signed   By: Nelson Chimes M.D.   On: 09/04/2019 20:40   DG Chest Portable 1 View  Result Date: 09/04/2019 CLINICAL DATA:  Chest pain radiating to the left arm. EXAM: PORTABLE CHEST 1 VIEW COMPARISON:  06/26/2016 FINDINGS: Artifact overlies the chest. Heart size within normal limits. Coronary stents. Aortic atherosclerosis. Pulmonary venous hypertension without frank edema. No infiltrate, collapse or effusion. No acute bone finding. IMPRESSION: Coronary stents. Aortic atherosclerosis. Pulmonary venous hypertension without frank edema. Electronically Signed   By: Nelson Chimes M.D.   On: 09/04/2019 18:09   CT ANGIO CHEST AORTA W/CM & OR WO/CM  Result Date: 09/04/2019 CLINICAL  DATA:  Pain radiating to the back of the chest and left arm. Assess for dissection. EXAM: CT ANGIOGRAPHY CHEST WITH CONTRAST TECHNIQUE: Multidetector CT imaging of the chest was performed using the standard protocol during bolus administration of intravenous contrast. Multiplanar CT image reconstructions and MIPs were obtained to evaluate the vascular anatomy. CONTRAST:  129mL OMNIPAQUE IOHEXOL 350 MG/ML SOLN COMPARISON:  Chest radiography same day. CT 10/19/2012. FINDINGS: Cardiovascular: Heart size mildly enlarged. Extensive coronary artery calcification. There is either motion artifact or some chronic thrombus along the right lateral edge of the left atrium. Focal calcification along the left edge could be focal calcification in the mitral valve annulus or could be dystrophic muscular calcification. Echocardiography could evaluate these findings. The ascending aorta shows a maximal diameter of 3.7 cm. No ascending aortic dissection. The descending aorta has fairly considerable soft and calcified plaque with some irregularity/ulceration. No actual dissection however. Mediastinum/Nodes: No mediastinal or hilar mass or lymphadenopathy. Lungs/Pleura: Lung parenchyma is clear. No mass, nodule, infiltrate or collapse. No pulmonary edema. No pleural effusion. Upper Abdomen: Negative Musculoskeletal: Bridging osteophytes suggesting diffuse idiopathic skeletal hyperostosis. Superior endplate depression at 624THL. Vacuum phenomenon in the disc suggests that this could be an acute or subacute superior endplate fracture. Review of the MIP images confirms the above findings. IMPRESSION: 1. No type A dissection. Intimal thickening with calcified and soft plaque in the descending thoracic aorta which I think is a manifestation of atherosclerotic disease, possibly with some ulceration, rather than a thrombosed false lumen. Ascending aorta diameter 3.7 cm Recommend annual imaging followup by CTA or MRA. This recommendation follows  2010 ACCF/AHA/AATS/ACR/ASA/SCA/SCAI/SIR/STS/SVM Guidelines for the Diagnosis and Management of Patients with Thoracic Aortic Disease. Circulation.2010; 121ML:4928372. Aortic aneurysm NOS (ICD10-I71.9) 2. Extensive coronary artery calcification. Some motion artifact or some chronic thrombus along the right lateral edge of the left atrium. Focal calcification along the left edge could be focal calcification in the mitral valve annulus or could be dystrophic muscular calcification. Echocardiography could evaluate these findings. 3. Superior endplate depression at 624THL. Vacuum phenomenon in the disc suggests that this could be an acute or subacute superior endplate fracture. Electronically Signed   By: Nelson Chimes M.D.   On: 09/04/2019 20:38    Medications:   . aspirin EC  81 mg Oral Daily  . atorvastatin  40 mg Oral Daily  .  carvedilol  12.5 mg Oral BID WC  . clopidogrel  75 mg Oral Daily  . losartan  100 mg Oral Daily   And  . hydrochlorothiazide  12.5 mg Oral Daily  . insulin aspart  0-15 Units Subcutaneous TID WC  . insulin glargine  20 Units Subcutaneous QHS  . nortriptyline  150 mg Oral QHS   Continuous Infusions: . heparin 1,050 Units/hr (09/05/19 0657)     LOS: 0 days   Konya Fauble  [facility]     09/05/2019, 11:22 AM

## 2019-09-05 NOTE — ED Notes (Signed)
Attempted to call report x 3. 

## 2019-09-05 NOTE — ED Notes (Signed)
Pt given food and drink at this time.

## 2019-09-05 NOTE — ED Notes (Signed)
Patient assisted to restroom, no issues

## 2019-09-05 NOTE — ED Notes (Signed)
Pt is resting in bed, respirations are equal and unlabored. No signs of acute distress.

## 2019-09-05 NOTE — Consult Note (Signed)
ANTICOAGULATION CONSULT NOTE - Follow Up Consult  Pharmacy Consult for Heparin Infusion  Indication: chest pain/ACS  No Known Allergies  Patient Measurements: Height: 5\' 8"  (172.7 cm) Weight: 85.7 kg (189 lb) IBW/kg (Calculated) : 63.9 Heparin Dosing Weight: 81kg  Vital Signs: BP: 162/78 (04/20 1615) Pulse Rate: 76 (04/20 1615)  Labs: Recent Labs    09/04/19 1726 09/04/19 1726 09/04/19 1930 09/04/19 2228 09/05/19 0549 09/05/19 0859 09/05/19 1247 09/05/19 1545  HGB 13.6   < >  --  13.1 13.3  --   --   --   HCT 40.5  --   --  39.1 37.7  --   --   --   PLT 213  --   --  219 213  --   --   --   APTT  --   --   --  31  --   --   --   --   LABPROT  --   --   --  13.0  --   --   --   --   INR  --   --   --  1.0  --   --   --   --   HEPARINUNFRC  --   --   --   --  0.29*  --   --  0.29*  CREATININE 0.70  --   --   --  0.72  --   --   --   TROPONINIHS 6   < > 23*  --   --  182* 111*  --    < > = values in this interval not displayed.    Estimated Creatinine Clearance: 68.6 mL/min (by C-G formula based on SCr of 0.72 mg/dL).   Assessment: Pharmacy consulted for heparin infusion dosing and monitoring in 79 yo female with chest pain/ACS.   4/20 0549 HL 0.29 Will increase rate to 1050 units/hr 4/20 1545 HL 0.29 Will increase rate to 1200 units/hr  Goal of Therapy:  Heparin level 0.3-0.7 units/ml Monitor platelets by anticoagulation protocol: Yes   Plan:  04/20 @ 0600 HL 0.29 subtherapeutic. Will increase rate to 1200 units/hr and will recheck HL in 8 hours and will continue to monitor. CBC with AM labs.   Oswald Hillock, PharmD, BCPS Clinical Pharmacist 09/05/2019 4:17 PM

## 2019-09-05 NOTE — ED Notes (Signed)
Ready bed @ 1704, patient going to room 250, spoke with RN Chrissy

## 2019-09-05 NOTE — Consult Note (Signed)
Cardiology Consultation:   Patient ID: Cynthia Singh; [id number]; 14-Apr-1941   Admit date: 09/04/2019 Date of Consult: 09/05/2019  Primary Care Provider: Venia Carbon, MD Primary Cardiologist: Johnsie Cancel   Patient Profile:   Cynthia Singh is a 79 y.o. female with a hx of CAD s/p RCA stenting in 2014 and PCI/DES to the LAD with POBA to the D1 in 01/2015, poorly controlled DM, bilateral SDH in the setting of a mechanical fall in 2018 while on DAPT, HTN, HLD,a nd OA who is being seen today for the evaluation of elevated troponin at the request of Dr. Damita Dunnings.  History of Present Illness:   Cynthia Singh was admitted in 01/2015 with unstable angina. Diagnostic LHC at that time showed 80% proximal LAD stenosis treated with PCI/DES, 95% ostial D1 lesion treated with PTCA with 40% residual stenosis, 60% mid LCx lesion, patent RCA stent, EF 55-65%. She was seen in hospital follow up in 03/2015, though was lost to follow up thereafter until she was admitted to [facility] in 07/2016 in the setting of a mechanical falls, suffering bilateral SDH while on DAPT. Plavix was discontinued with recommendation to resume ASA when safe. She has been lost to follow up since.   She presented to [facility] on 09/04/2019, with onset of posterior neck and thoracic back pain that began on 4/19, while sitting on her sofa. Pain was described as "pain with a funny feeling." She has been unable to elaborate further on this. Symptoms do not feel similar to her prior angina. No associated dyspnea, palpitations, dizziness, presyncope, or syncope.   Upon the patient's arrival to [facility] they were found to have BP in the A999333 systolic, improved to the 123456 systolic. EKG as below, CXR showed vascular congestion. CTA aorta showed no type A dissection with intimal thickening felt to represent atherosclerotic disease, ascending aorta 3.7 cm, coronary artery calcification with motion artifact along the right lateral edge of the left atrium as  well as calcification of the left edge, and superior endplate depression at 624THL. CT head was not acute. Labs showed HS-Tn of 6 with a delta of 23. She was given a baby aspirin, morphine, nitropaste, and started on heparin and nitro gtts. She was made NPO and cardiology was consulted. Currently, she continues to note neck and back pain that are unchanged on the nitro gtt. Her main complaint this morning is a headache, likely secondary to nitro gtt. No current chest pain.   Past Medical History:  Diagnosis Date  . CAD (coronary artery disease)    a. Remote nonobstructive disease but in 10/2012 Cath/PCI: s/p DES to RCA. b. cath 02/08/15 DES to prox LAD and balloon angioplasty of ost D1, EF 55-65%.  . Concussion    Carlos AFTER FALL 3/18  . Depression   . Diabetes mellitus, type 2 ([place])   . Dyslipidemia   . Episodic mood disorder ([place])   . Hypertension   . Myocardial infarction ([place])    2014  . Obesity   . Osteoarthritis of spine    knees also  . Peripheral vascular disease, unspecified ([place]) 2020   severe disease on home screening by insurance    Past Surgical History:  Procedure Laterality Date  . ABDOMINAL HYSTERECTOMY    . APPENDECTOMY  1959  . CARDIAC CATHETERIZATION N/A 02/08/2015   Procedure: Left Heart Cath and Coronary Angiography;  Surgeon: Sherren Mocha, MD;  Location: [place] [facility];  Service: Cardiovascular;  Laterality: N/A;  .  CARDIAC CATHETERIZATION N/A 02/08/2015   Procedure: Coronary Stent Intervention;  Surgeon: Sherren Mocha, MD;  Location: [place] [facility];  Service: Cardiovascular;  Laterality: N/A;  . CARDIAC CATHETERIZATION N/A 02/08/2015   Procedure: Intravascular Pressure Wire/FFR Study;  Surgeon: Sherren Mocha, MD;  Location: [place] [facility];  Service: Cardiovascular;  Laterality: N/A;  . CATARACT EXTRACTION W/PHACO Left 11/10/2016   Procedure: CATARACT EXTRACTION PHACO AND INTRAOCULAR LENS PLACEMENT ([place]) suture placed in left eye at end of procedure;   Surgeon: Birder Robson, MD;  Location: [facility] ORS;  Service: Ophthalmology;  Laterality: Left;  [place]  3.20 AP% 27.7 CDE 55.63 Fluid pack lot # [id number]  . CHOLECYSTECTOMY    . COMBINED HYSTERECTOMY ABDOMINAL W/ A&P REPAIR / OOPHORECTOMY  1968  . CORONARY ANGIOPLASTY     STENTS X 5  . CORONARY STENT PLACEMENT  2014  . DILATION AND CURETTAGE OF UTERUS    . [place]  . LEFT HEART CATHETERIZATION WITH CORONARY ANGIOGRAM N/A 10/20/2012   Procedure: LEFT HEART CATHETERIZATION WITH CORONARY ANGIOGRAM;  Surgeon: Sherren Mocha, MD;  Location: [facility] CATH LAB;  Service: Cardiovascular;  Laterality: N/A;  . multiple D&C    . TONSILLECTOMY  age 58     Home Meds: Prior to Admission medications   Medication Sig Start Date End Date Taking? Authorizing Provider  aspirin EC 81 MG tablet Take 81 mg by mouth daily.   Yes [provider]  atorvastatin (LIPITOR) 80 MG tablet TAKE 1/2 TABLETS (40 MG TOTAL) BY MOUTH DAILY. 06/12/19  Yes Venia Carbon, MD  carvedilol (COREG) 12.5 MG tablet Take 1 tablet (12.5 mg total) by mouth 2 (two) times daily with a meal. 04/07/18  Yes [name] I, MD  clopidogrel (PLAVIX) 75 MG tablet TAKE 1 TABLET BY MOUTH EVERY DAY 05/22/19  Yes Venia Carbon, MD  Cranberry 500 MG CAPS Take 1,000 mg by mouth daily.   Yes [provider]  empagliflozin (JARDIANCE) 25 MG TABS tablet Take 25 mg by mouth daily before breakfast. 01/12/19  Yes Philemon Kingdom, MD  Insulin Glargine (LANTUS SOLOSTAR) 100 UNIT/ML Solostar Pen TAKE 36 UNITS DAILY UNDER SKIN 05/22/19  Yes [name] I, MD  Insulin Pen Needle (PEN NEEDLES) 32G X 5 MM MISC 1 Units by Does not apply route daily. 11/05/16  Yes Venia Carbon, MD  losartan-hydrochlorothiazide (HYZAAR) 100-12.5 MG tablet Take 1 tablet by mouth daily. 04/07/18  Yes [name] I, MD  metFORMIN (GLUCOPHAGE-XR) 500 MG 24 hr tablet TAKE 2 TABLETS BY MOUTH EVERY DAY WITH BREAKFAST 10/26/18  Yes Venia Carbon, MD  nitroGLYCERIN (NITROSTAT) 0.4 MG SL tablet Place 1 tablet (0.4 mg total) under the tongue every 5 (five) minutes as needed for chest pain. 08/24/19  Yes Venia Carbon, MD  nortriptyline (PAMELOR) 50 MG capsule TAKE 3 CAPSULES (150 MG TOTAL) BY MOUTH AT BEDTIME. 06/09/19  Yes Venia Carbon, MD  Omega-3 Fatty Acids (FISH OIL PO) Take 1 capsule by mouth daily.   Yes [provider]  oxyCODONE-acetaminophen (PERCOCET) 10-325 MG tablet Take 1-2 tablets by mouth every 6 (six) hours as needed for pain. M54.9 08/09/19  Yes Venia Carbon, MD  polyethylene glycol (MIRALAX / GLYCOLAX) packet Take 17 g by mouth daily as needed.    Yes [provider]    Inpatient Medications: Scheduled Meds: . aspirin EC  81 mg Oral Daily  . aspirin EC  81 mg Oral Daily  . atorvastatin  40 mg Oral Daily  . carvedilol  12.5 mg Oral BID WC  . losartan  100 mg Oral Daily   And  . hydrochlorothiazide  12.5 mg Oral Daily  . insulin aspart  0-15 Units Subcutaneous TID WC  . insulin glargine  20 Units Subcutaneous QHS   Continuous Infusions: . heparin 1,050 Units/hr (09/05/19 0657)  . nitroGLYCERIN 10 mcg/min (09/04/19 2229)   PRN Meds: acetaminophen, ALPRAZolam, morphine injection, nitroGLYCERIN, ondansetron (ZOFRAN) IV  Allergies:  No Known Allergies  Social History:   Social History   Socioeconomic History  . Marital status: Widowed    Spouse name: Not on file  . Number of children: 3  . Years of education: 20  . Highest education level: Not on file  Occupational History  . Occupation: Scientist, water quality  Tobacco Use  . Smoking status: Never Smoker  . Smokeless tobacco: Never Used  Substance and Sexual Activity  . Alcohol use: No  . Drug use: No  . Sexual activity: Never    Birth control/protection: Post-menopausal  Other Topics Concern  . Not on file  Social History Narrative   Widowed '13. 2 sons- '63, '66; one daughter-'69;    76 grandchildren--has raised 9 of her  grandchildren 4 great grandchildren.   Lives with 2 granddaughters and adoptive son      Has living will   Probably would have son Tenishia Kallus as health care POA--not set up   Would accept resuscitation but no prolonged ventilation   Not sure about tube feeds   Social Determinants of Health   Financial Resource Strain:   . Difficulty of Paying Living Expenses:   Food Insecurity:   . Worried About Charity fundraiser in the Last Year:   . Arboriculturist in the Last Year:   Transportation Needs:   . Film/video editor (Medical):   Marland Kitchen Lack of Transportation (Non-Medical):   Physical Activity:   . Days of Exercise per Week:   . Minutes of Exercise per Session:   Stress:   . Feeling of Stress :   Social Connections:   . Frequency of Communication with Friends and Family:   . Frequency of Social Gatherings with Friends and Family:   . Attends Religious Services:   . Active Member of Clubs or Organizations:   . Attends Archivist Meetings:   Marland Kitchen Marital Status:   Intimate Partner Violence:   . Fear of Current or Ex-Partner:   . Emotionally Abused:   Marland Kitchen Physically Abused:   . Sexually Abused:      Family History:   Family History  Problem Relation Age of Onset  . Cancer Brother        Colon  . Coronary artery disease Neg Hx   . Heart attack Neg Hx     ROS:  Review of Systems  Constitutional: Positive for malaise/fatigue. Negative for chills, diaphoresis, fever and weight loss.  HENT: Negative for congestion.   Eyes: Negative for discharge and redness.  Respiratory: Negative for cough, sputum production, shortness of breath and wheezing.   Cardiovascular: Positive for chest pain. Negative for palpitations, orthopnea, claudication, leg swelling and PND.  Gastrointestinal: Negative for abdominal pain, heartburn, nausea and vomiting.  Musculoskeletal: Positive for myalgias and neck pain. Negative for falls.  Skin: Negative for rash.  Neurological: Positive for  headaches. Negative for dizziness, tingling, tremors, sensory change, speech change, focal weakness, loss of consciousness and weakness.  Endo/Heme/Allergies: Does not bruise/bleed easily.  Psychiatric/Behavioral:  Negative for substance abuse. The patient is not nervous/anxious.   All other systems reviewed and are negative.     Physical Exam/Data:   Vitals:   09/05/19 0230 09/05/19 0330 09/05/19 0530 09/05/19 0700  BP: (!) 152/86 (!) 151/86 (!) 156/89 (!) 163/96  Pulse: 80 83 81 85  Resp: (!) 24 16 20  (!) 25  Temp:      TempSrc:      SpO2: 94% 92% 90% 93%  Weight:      Height:       No intake or output data in the 24 hours ending 09/05/19 0828 Filed Weights   09/04/19 1720  Weight: 85.7 kg   Body mass index is 28.74 kg/m.   Physical Exam: General: Well developed, well nourished, in no acute distress. Head: Normocephalic, atraumatic, sclera non-icteric, no xanthomas, nares without discharge.  Neck: Negative for carotid bruits. JVD not elevated. TTP.  Lungs: Clear bilaterally to auscultation without wheezes, rales, or rhonchi. Breathing is unlabored. Heart: RRR with S1 S2. No murmurs, rubs, or gallops appreciated. Abdomen: Soft, non-tender, non-distended with normoactive bowel sounds. No hepatomegaly. No rebound/guarding. No obvious abdominal masses. Msk:  Strength and tone appear normal for age. Thoracic region TTP. Extremities: No clubbing or cyanosis. No edema. Distal pedal pulses are 2+ and equal bilaterally. Neuro: Alert and oriented X 3. No facial asymmetry. No focal deficit. Moves all extremities spontaneously. Psych:  Responds to questions appropriately with a normal affect.   EKG:  The EKG was personally reviewed and demonstrates: NSR, 86 bpm, nonspecific inferolateral st/t changes which are new when compared to prior) Telemetry:  Telemetry was personally reviewed and demonstrates: artifact   Weights: Filed Weights   09/04/19 1720  Weight: 85.7 kg    Relevant  CV Studies: As above  Laboratory Data:  Chemistry Recent Labs  Lab 09/04/19 1726 09/05/19 0549  NA 137 138  K 3.9 4.0  CL 105 102  CO2 24 28  GLUCOSE 369* 224*  BUN 10 10  CREATININE 0.70 0.72  CALCIUM 8.6* 9.5  GFRNONAA >60 >60  GFRAA >60 >60  ANIONGAP 8 8    Recent Labs  Lab 09/04/19 1726  PROT 6.4*  ALBUMIN 3.4*  AST 20  ALT 14  ALKPHOS 74  BILITOT 0.8   Hematology Recent Labs  Lab 09/04/19 1726 09/04/19 2228 09/05/19 0549  WBC 10.3 10.9* 13.5*  RBC 4.55 4.49 4.46  HGB 13.6 13.1 13.3  HCT 40.5 39.1 37.7  MCV 89.0 87.1 84.5  MCH 29.9 29.2 29.8  MCHC 33.6 33.5 35.3  RDW 12.3 12.2 12.4  PLT 213 219 213   Cardiac EnzymesNo results for input(s): TROPONINI in the last 168 hours. No results for input(s): TROPIPOC in the last 168 hours.  BNPNo results for input(s): BNP, PROBNP in the last 168 hours.  DDimer No results for input(s): DDIMER in the last 168 hours.  Radiology/Studies:  CT Head Wo Contrast  Result Date: 09/04/2019 IMPRESSION: No acute finding by CT. Mild age related volume loss. Atherosclerotic calcification of the major vessels at the base of the brain. Electronically Signed   By: Nelson Chimes M.D.   On: 09/04/2019 20:40   DG Chest Portable 1 View  Result Date: 09/04/2019 IMPRESSION: Coronary stents. Aortic atherosclerosis. Pulmonary venous hypertension without frank edema. Electronically Signed   By: Nelson Chimes M.D.   On: 09/04/2019 18:09   CT ANGIO CHEST AORTA W/CM & OR WO/CM  Result Date: 09/04/2019 IMPRESSION: 1. No type A dissection. Intimal  thickening with calcified and soft plaque in the descending thoracic aorta which I think is a manifestation of atherosclerotic disease, possibly with some ulceration, rather than a thrombosed false lumen. Ascending aorta diameter 3.7 cm Recommend annual imaging followup by CTA or MRA. This recommendation follows 2010 ACCF/AHA/AATS/ACR/ASA/SCA/SCAI/SIR/STS/SVM Guidelines for the Diagnosis and Management  of Patients with Thoracic Aortic Disease. Circulation.2010; [id number]. Aortic aneurysm NOS (ICD10-I71.9) 2. Extensive coronary artery calcification. Some motion artifact or some chronic thrombus along the right lateral edge of the left atrium. Focal calcification along the left edge could be focal calcification in the mitral valve annulus or could be dystrophic muscular calcification. Echocardiography could evaluate these findings. 3. Superior endplate depression at 624THL. Vacuum phenomenon in the disc suggests that this could be an acute or subacute superior endplate fracture. Electronically Signed   By: Nelson Chimes M.D.   On: 09/04/2019 20:38    Assessment and Plan:   1. CAD involving the native coronary arteries with elevated troponin and back pain: -Symptoms do not feel similar to her prior angina -Currently, continues to note posterior neck and midline back pain that is worse with movement and reproducible to palpation -No chest pain -Nitro gtt has not helped and has led to a headache, this will be discontinued  -Initial HS-Tn 6 with a delta of 23, cycle this morning, if this is dynamically elevated we will plan for diagnostic LHC. If her HS-Tn is unchanged or improved, recommend Lexiscan Myoview with timing to be determined on nuclear medicine -Echo -ASA -Heparin gtt -Risks and benefits of cardiac catheterization have been discussed with the patient including risks of bleeding, bruising, infection, kidney damage, stroke, heart attack, urgent need for bypass, injury to a limb and death. The patient understands these risks and is willing to proceed with the procedure. All questions have been answered and concerns listened to  2. Hypertensive urgency: -BP improving to the 123456 systolic from the A999333 -Stop nitro gtt as above -Coreg -Losartan -HCTZ -Monitor   3. Poorly controlled DM: -A1c of 11.8 from earlier this month -SSI per IM  4. HLD with hypertriglyceridemia: -LDL 62 this  admission -Lipitor  -Consider Vascepa as an outpatient   5. Dilated ascending aorta: -Optimal BP, HR, lipid, glucose control -Outpatient follow up imaging   6. Neck/back pain: -Reproducible to movement and palpation, though does not feel exactly similar when palpated  -Cardiac work up as above -Further management per IM   For questions or updates, please contact [place] Please consult [email or web address] for contact info under Cardiology/STEMI.   Signed, Christell Faith, PA-C [facility] Pager: [phone] 09/05/2019, 8:28 AM

## 2019-09-05 NOTE — Progress Notes (Signed)
She now refuses LHC. Plan for continuation of heparin gtt and echo. Further recommendations pending echo.

## 2019-09-05 NOTE — ED Notes (Signed)
Attempted to call report x2

## 2019-09-05 NOTE — Progress Notes (Signed)
Patient Is complaining of left arm pain 8/10, blood pressure elevated.. Administered 2 SL nitro without any relief. Blood Pressure has improved. Notified [facility] NP about patient current condition. Orders were given to administer morphine and xanax. Hold the nortriptyline for tonight. Patient also has percocet order for longer relief. Patient requested to have morphine at this time. Will continue to monitor and assess.

## 2019-09-06 ENCOUNTER — Encounter: Payer: Self-pay | Admitting: Internal Medicine

## 2019-09-06 ENCOUNTER — Observation Stay (HOSPITAL_COMMUNITY): Payer: Medicare Other

## 2019-09-06 ENCOUNTER — Telehealth: Payer: Self-pay

## 2019-09-06 DIAGNOSIS — I214 Non-ST elevation (NSTEMI) myocardial infarction: Secondary | ICD-10-CM

## 2019-09-06 DIAGNOSIS — R778 Other specified abnormalities of plasma proteins: Secondary | ICD-10-CM | POA: Diagnosis present

## 2019-09-06 DIAGNOSIS — E1151 Type 2 diabetes mellitus with diabetic peripheral angiopathy without gangrene: Secondary | ICD-10-CM | POA: Diagnosis present

## 2019-09-06 DIAGNOSIS — E785 Hyperlipidemia, unspecified: Secondary | ICD-10-CM | POA: Diagnosis present

## 2019-09-06 DIAGNOSIS — Z90722 Acquired absence of ovaries, bilateral: Secondary | ICD-10-CM | POA: Diagnosis not present

## 2019-09-06 DIAGNOSIS — Z794 Long term (current) use of insulin: Secondary | ICD-10-CM | POA: Diagnosis not present

## 2019-09-06 DIAGNOSIS — Z7982 Long term (current) use of aspirin: Secondary | ICD-10-CM | POA: Diagnosis not present

## 2019-09-06 DIAGNOSIS — Z79899 Other long term (current) drug therapy: Secondary | ICD-10-CM | POA: Diagnosis not present

## 2019-09-06 DIAGNOSIS — I200 Unstable angina: Secondary | ICD-10-CM | POA: Diagnosis not present

## 2019-09-06 DIAGNOSIS — E781 Pure hyperglyceridemia: Secondary | ICD-10-CM | POA: Diagnosis present

## 2019-09-06 DIAGNOSIS — Z90710 Acquired absence of both cervix and uterus: Secondary | ICD-10-CM | POA: Diagnosis not present

## 2019-09-06 DIAGNOSIS — Z6825 Body mass index (BMI) 25.0-25.9, adult: Secondary | ICD-10-CM | POA: Diagnosis not present

## 2019-09-06 DIAGNOSIS — I160 Hypertensive urgency: Secondary | ICD-10-CM | POA: Diagnosis not present

## 2019-09-06 DIAGNOSIS — F329 Major depressive disorder, single episode, unspecified: Secondary | ICD-10-CM | POA: Diagnosis present

## 2019-09-06 DIAGNOSIS — Z7902 Long term (current) use of antithrombotics/antiplatelets: Secondary | ICD-10-CM | POA: Diagnosis not present

## 2019-09-06 DIAGNOSIS — Z20822 Contact with and (suspected) exposure to covid-19: Secondary | ICD-10-CM | POA: Diagnosis present

## 2019-09-06 DIAGNOSIS — E1165 Type 2 diabetes mellitus with hyperglycemia: Secondary | ICD-10-CM | POA: Diagnosis not present

## 2019-09-06 DIAGNOSIS — R079 Chest pain, unspecified: Secondary | ICD-10-CM | POA: Diagnosis not present

## 2019-09-06 DIAGNOSIS — I10 Essential (primary) hypertension: Secondary | ICD-10-CM | POA: Diagnosis present

## 2019-09-06 DIAGNOSIS — Z66 Do not resuscitate: Secondary | ICD-10-CM | POA: Diagnosis present

## 2019-09-06 DIAGNOSIS — Z955 Presence of coronary angioplasty implant and graft: Secondary | ICD-10-CM | POA: Diagnosis not present

## 2019-09-06 DIAGNOSIS — I712 Thoracic aortic aneurysm, without rupture: Secondary | ICD-10-CM | POA: Diagnosis present

## 2019-09-06 DIAGNOSIS — I2510 Atherosclerotic heart disease of native coronary artery without angina pectoris: Secondary | ICD-10-CM | POA: Diagnosis not present

## 2019-09-06 DIAGNOSIS — I25110 Atherosclerotic heart disease of native coronary artery with unstable angina pectoris: Secondary | ICD-10-CM | POA: Diagnosis present

## 2019-09-06 DIAGNOSIS — Z9861 Coronary angioplasty status: Secondary | ICD-10-CM

## 2019-09-06 DIAGNOSIS — Z9049 Acquired absence of other specified parts of digestive tract: Secondary | ICD-10-CM | POA: Diagnosis not present

## 2019-09-06 DIAGNOSIS — I252 Old myocardial infarction: Secondary | ICD-10-CM | POA: Diagnosis not present

## 2019-09-06 DIAGNOSIS — E669 Obesity, unspecified: Secondary | ICD-10-CM | POA: Diagnosis present

## 2019-09-06 DIAGNOSIS — R519 Headache, unspecified: Secondary | ICD-10-CM | POA: Diagnosis not present

## 2019-09-06 LAB — CBC
HCT: 41.1 % (ref 36.0–46.0)
Hemoglobin: 14.2 g/dL (ref 12.0–15.0)
MCH: 29.5 pg (ref 26.0–34.0)
MCHC: 34.5 g/dL (ref 30.0–36.0)
MCV: 85.3 fL (ref 80.0–100.0)
Platelets: 224 10*3/uL (ref 150–400)
RBC: 4.82 MIL/uL (ref 3.87–5.11)
RDW: 12.7 % (ref 11.5–15.5)
WBC: 12.5 10*3/uL — ABNORMAL HIGH (ref 4.0–10.5)
nRBC: 0 % (ref 0.0–0.2)

## 2019-09-06 LAB — GLUCOSE, CAPILLARY
Glucose-Capillary: 173 mg/dL — ABNORMAL HIGH (ref 70–99)
Glucose-Capillary: 220 mg/dL — ABNORMAL HIGH (ref 70–99)

## 2019-09-06 LAB — NM MYOCAR MULTI W/SPECT W/WALL MOTION / EF
LV dias vol: 89 mL (ref 46–106)
LV sys vol: 41 mL
Peak HR: 75 {beats}/min
Percent HR: 52 %
Rest HR: 65 {beats}/min
SDS: 6
SRS: 3
SSS: 5
TID: 1.26

## 2019-09-06 LAB — ECHOCARDIOGRAM COMPLETE
Height: 68 in
Weight: 3024 oz

## 2019-09-06 LAB — HEPARIN LEVEL (UNFRACTIONATED)
Heparin Unfractionated: 0.5 IU/mL (ref 0.30–0.70)
Heparin Unfractionated: 0.34 IU/mL (ref 0.30–0.70)

## 2019-09-06 LAB — TROPONIN I (HIGH SENSITIVITY)
Troponin I (High Sensitivity): 150 ng/L (ref <18)
Troponin I (High Sensitivity): 145 ng/L (ref <18)

## 2019-09-06 MED ORDER — TECHNETIUM TC 99M TETROFOSMIN IV KIT
10.0000 | PACK | Freq: Once | INTRAVENOUS | Status: AC | PRN
  Administered 2019-09-06: 10.993 via INTRAVENOUS

## 2019-09-06 MED ORDER — OXYCODONE-ACETAMINOPHEN 5-325 MG PO TABS
1.0000 | ORAL_TABLET | Freq: Four times a day (QID) | ORAL | Status: DC | PRN
  Administered 2019-09-06: 1 via ORAL
  Filled 2019-09-06: qty 1

## 2019-09-06 MED ORDER — REGADENOSON 0.4 MG/5ML IV SOLN
0.4000 mg | Freq: Once | INTRAVENOUS | Status: AC
  Administered 2019-09-06: 0.4 mg via INTRAVENOUS

## 2019-09-06 MED ORDER — TECHNETIUM TC 99M TETROFOSMIN IV KIT
30.0000 | PACK | Freq: Once | INTRAVENOUS | Status: AC | PRN
  Administered 2019-09-06: 31.98 via INTRAVENOUS

## 2019-09-06 NOTE — Consult Note (Signed)
ANTICOAGULATION CONSULT NOTE - Follow Up Consult  Pharmacy Consult for Heparin Infusion  Indication: chest pain/ACS  No Known Allergies  Patient Measurements: Height: 5\' 11"  (180.3 cm) Weight: 82.9 kg (182 lb 12.8 oz) IBW/kg (Calculated) : 70.8 Heparin Dosing Weight: 81kg  Vital Signs: Temp: 98.2 F (36.8 C) (04/21 0736) Temp Source: Oral (04/21 0736) BP: 133/63 (04/21 0736) Pulse Rate: 61 (04/21 0736)  Labs: Recent Labs    09/04/19 1726 09/04/19 1930 09/04/19 2228 09/04/19 2228 09/05/19 0549 09/05/19 0549 09/05/19 0859 09/05/19 1247 09/05/19 1545 09/05/19 2241 09/06/19 0205 09/06/19 1039  HGB 13.6   < > 13.1   < > 13.3  --   --   --   --   --  14.2  --   HCT 40.5   < > 39.1  --  37.7  --   --   --   --   --  41.1  --   PLT 213   < > 219  --  213  --   --   --   --   --  224  --   APTT  --   --  31  --   --   --   --   --   --   --   --   --   LABPROT  --   --  13.0  --   --   --   --   --   --   --   --   --   INR  --   --  1.0  --   --   --   --   --   --   --   --   --   HEPARINUNFRC  --   --   --   --  0.29*   < >  --   --  0.29*  --  0.50 0.34  CREATININE 0.70  --   --   --  0.72  --   --   --   --   --   --   --   TROPONINIHS 6   < >  --   --   --   --    < > 111*  --  150* 145*  --    < > = values in this interval not displayed.    Estimated Creatinine Clearance: 66.9 mL/min (by C-G formula based on SCr of 0.72 mg/dL).   Assessment: Pharmacy consulted for heparin infusion dosing and monitoring in 79 yo female with chest pain/ACS.   4/20 0549 HL 0.29 Will increase rate to 1050 units/hr 4/20 1545 HL 0.29 Will increase rate to 1200 units/hr  Goal of Therapy:  Heparin level 0.3-0.7 units/ml Monitor platelets by anticoagulation protocol: Yes   Plan:  04/21 @ 1105 HL 0.34  therapeuticx2. However, heparin level is lower that previous level. Per nurse, Amy, she is unaware of heparin interruptions. Amy did report that patient had stress test today from ~0800  to 1100 and infusion could have been stopped for those reasons. Called Nuclear Medicine and spoke to nurse Jodell Cipro) who reported that heparin was stopped ~ 20 minutes total while patient was on her unit.   Will continue current rate and will recheck HL in 6 hours for confirmation. CBC stable will continue to monitor.  Rowland Lathe, PharmD Clinical Pharmacist 09/06/2019 11:24 AM

## 2019-09-06 NOTE — Discharge Summary (Signed)
Physician Discharge Summary  Cynthia Singh [id number] DOB: 1940-05-26 DOA: 09/04/2019  PCP: Venia Carbon, MD  Admit date: 09/04/2019  Discharge date: 09/06/2019  Admitted From: Home  Disposition: Home  Recommendations for Outpatient Follow-up:  1. Follow up with PCP in 1-2 weeks 2. Follow-up with cardiology. 3. Please obtain BMP/CBC in one week Please follow up on the following pending results: None  Home Health: No  Equipment/Devices: None  discharge Condition: Stable  CODE STATUS: DNR  Diet recommendation: Heart Healthy / Carb Modified  Brief/Interim Summary:  Cynthia Singh is an 79 y.o. female with medical history significant for CAD with history of stents to the RCA 2014, LAD in 2016 as well as history of diabetes and hypertension who presents to the emergency room with severe midsternal chest pain 10/10 radiating to her shoulders and back that started a couple hours prior to arrival. She had elevated troponins. She was admitted to the hospital for acute NSTEMI. She was treated with IV heparin infusion and cardiologist was consulted. Left heart cath was offered but patient declined. Cardiologist recommended medical management. Echocardiogram was in sufficient for wall motion abnormalities and inconclusive coercive for diastolic dysfunction. A nuclear stress test was done which was low risk for ischemia. Troponin peaked at 182 and then trending down.  Her chest pain resolved, she continued to experience left arm pain which seems more like musculoskeletal. Patient also has an history of arthritis and use Percocet for her pain. After discussing with cardiologist she will be discharged to follow-up with them as an outpatient.  She also advised to use Percocet and see if that will help with her left arm pain and follow-up with her primary care physician for further management.  She will continue rest of her home meds.  Discharge Diagnoses:  Active Problems:  Unstable angina (HCC)   CAD S/P percutaneous coronary angioplasty/DES (RCA-2014/LAD-2016)  Hypertensive urgency  Hyperglycemia due to type 2 diabetes mellitus (HCC)  Non-ST elevation (NSTEMI) myocardial infarction [facility])  NSTEMI (non-ST elevated myocardial infarction) [facility])   Discharge Instructions      Discharge Instructions     Diet - low sodium heart healthy  Complete by: As directed    Discharge instructions  Complete by: As directed    It was pleasure taking care of you.  Your stress test did not show any cardiac reason for your pain. Please follow-up with your cardiologist in 1 to 2 weeks.  Your left arm pain may be due to worsening of your arthritis, see if your home dose of Percocet helps and follow-up with your primary care physician.   Increase activity slowly  Complete by: As directed       Allergies as of 09/06/2019   No Known Allergies          Medication List     TAKE these medications    aspirin EC 81 MG tablet  Take 81 mg by mouth daily.   atorvastatin 80 MG tablet  Commonly known as: LIPITOR  TAKE 1/2 TABLETS (40 MG TOTAL) BY MOUTH DAILY.   carvedilol 12.5 MG tablet  Commonly known as: COREG  Take 1 tablet (12.5 mg total) by mouth 2 (two) times daily with a meal.   clopidogrel 75 MG tablet  Commonly known as: PLAVIX  TAKE 1 TABLET BY MOUTH EVERY DAY   Cranberry 500 MG Caps  Take 1,000 mg by mouth daily.   FISH OIL PO  Take 1 capsule by mouth daily.   Jardiance 25  MG Tabs tablet  Generic drug: empagliflozin  Take 25 mg by mouth daily before breakfast.   Lantus SoloStar 100 UNIT/ML Solostar Pen  Generic drug: insulin glargine  TAKE 36 UNITS DAILY UNDER SKIN   losartan-hydrochlorothiazide 100-12.5 MG tablet  Commonly known as: HYZAAR  Take 1 tablet by mouth daily.   metFORMIN 500 MG 24 hr tablet  Commonly known as: GLUCOPHAGE-XR  TAKE 2 TABLETS BY MOUTH EVERY DAY WITH BREAKFAST   nitroGLYCERIN 0.4 MG SL tablet  Commonly known as: Nitrostat  Place 1 tablet (0.4 mg  total) under the tongue every 5 (five) minutes as needed for chest pain.   nortriptyline 50 MG capsule  Commonly known as: PAMELOR  TAKE 3 CAPSULES (150 MG TOTAL) BY MOUTH AT BEDTIME.   oxyCODONE-acetaminophen 10-325 MG tablet  Commonly known as: PERCOCET  Take 1-2 tablets by mouth every 6 (six) hours as needed for pain. M54.9   Pen Needles 32G X 5 MM Misc  1 Units by Does not apply route daily.   polyethylene glycol 17 g packet  Commonly known as: MIRALAX / GLYCOLAX  Take 17 g by mouth daily as needed.        No Known Allergies  Consultations:  Cardiology Procedures/Studies:  Imaging Results    Subjective:  Patient denies any chest pain when seen during morning rounds. Continues to have left arm pain which increased with moving her arm.  Discharge Exam:      Vitals:   09/06/19 0736 09/06/19 1153  BP: 133/63 (!) 155/65  Pulse: 61 67  Resp: 19 19  Temp: 98.2 F (36.8 C) 98.9 F (37.2 C)  SpO2: 94% 96%         Vitals:   09/06/19 0221 09/06/19 0355 09/06/19 0736 09/06/19 1153  BP:  (!) 112/52 133/63 (!) 155/65  Pulse:  60 61 67  Resp:  19 19 19   Temp:  98.4 F (36.9 C) 98.2 F (36.8 C) 98.9 F (37.2 C)  TempSrc:  Oral Oral Oral  SpO2:  97% 94% 96%  Weight: 82.9 kg     Height:       General: Pt is alert, awake, not in acute distress  Cardiovascular: RRR, S1/S2 +, no rubs, no gallops  Respiratory: CTA bilaterally, no wheezing, no rhonchi  Abdominal: Soft, NT, ND, bowel sounds +  Extremities: no edema, no cyanosis  The results of significant diagnostics from this hospitalization (including imaging, microbiology, ancillary and laboratory) are listed below for reference.   Microbiology:         Recent Results (from the past 240 hour(s))  SARS CORONAVIRUS 2 (TAT 6-24 HRS) Nasopharyngeal Nasopharyngeal Swab Status: None   Collection Time: 09/04/19 11:27 PM   Specimen: Nasopharyngeal Swab  Result Value Ref Range Status   SARS Coronavirus 2 NEGATIVE NEGATIVE Final     Comment: (NOTE)  SARS-CoV-2 target nucleic acids are NOT DETECTED.  The SARS-CoV-2 RNA is generally detectable in upper and lower  respiratory specimens during the acute phase of infection. Negative  results do not preclude SARS-CoV-2 infection, do not rule out  co-infections with other pathogens, and should not be used as the  sole basis for treatment or other patient management decisions.  Negative results must be combined with clinical observations,  patient history, and epidemiological information. The expected  result is Negative.  Fact Sheet for Patients:  SugarRoll.be  Fact Sheet for Healthcare Providers:  https://www.woods-mathews.com/  This test is not yet approved or cleared by the [place] FDA  and  has been authorized for detection and/or diagnosis of SARS-CoV-2 by  FDA under an Emergency Use Authorization (EUA). This EUA will remain  in effect (meaning this test can be used) for the duration of the  COVID-19 declaration under Section 56 4(b)(1) of the Act, 21 U.S.C.  section 360bbb-3(b)(1), unless the authorization is terminated or  revoked sooner.  Performed at [facility], [place] [address], [place], [place]  [address]    Labs:  BNP (last 3 results)  Recent Labs (within last 365 days)    Basic Metabolic Panel:  Last Labs                                                        Liver Function Tests:  Last Labs                                       Last Labs      Last Labs     CBC:  Last Labs                                                       Cardiac Enzymes:  Last Labs     BNP:  Last Labs     CBG:  Last Labs                              D-Dimer  Recent Labs (last 2 labs)     Hgb A1c  Recent Labs (last 2 labs)     Lipid Profile  Recent Labs (last 2 labs)                                  Thyroid function studies  Recent Labs (last 2 labs)     Anemia work  up  [employer] (last 2 labs)     Urinalysis  Labs (Brief)                                                                                                          Sepsis Labs  Last Labs     Microbiology         Recent Results (from the past 240 hour(s))  SARS CORONAVIRUS 2 (TAT 6-24 HRS) Nasopharyngeal Nasopharyngeal Swab Status: None   Collection Time: 09/04/19 11:27 PM   Specimen: Nasopharyngeal Swab  Result Value Ref Range Status   SARS Coronavirus 2 NEGATIVE NEGATIVE Final    Comment: (NOTE)  SARS-CoV-2 target nucleic acids are NOT DETECTED.  The  SARS-CoV-2 RNA is generally detectable in upper and lower  respiratory specimens during the acute phase of infection. Negative  results do not preclude SARS-CoV-2 infection, do not rule out  co-infections with other pathogens, and should not be used as the  sole basis for treatment or other patient management decisions.  Negative results must be combined with clinical observations,  patient history, and epidemiological information. The expected  result is Negative.  Fact Sheet for Patients:  SugarRoll.be  Fact Sheet for Healthcare Providers:  https://www.woods-mathews.com/  This test is not yet approved or cleared by the [place] FDA and  has been authorized for detection and/or diagnosis of SARS-CoV-2 by  FDA under an Emergency Use Authorization (EUA). This EUA will remain  in effect (meaning this test can be used) for the duration of the  COVID-19 declaration under Section 56 4(b)(1) of the Act, 21 U.S.C.  section 360bbb-3(b)(1), unless the authorization is terminated or  revoked sooner.  Performed at [facility], [place] [address], [place], [place]  [address]    Time coordinating discharge: Over 30 minutes  SIGNED:  Lorella Nimrod, MD  [facility]  09/06/2019, 2:42 PM  If 7PM-7AM, please contact night-coverage  [email or web address]  This record has been  created using Systems analyst. Errors have been sought and corrected,but may not always be located. Such creation errors do not reflect on the standard of care.

## 2019-09-06 NOTE — Telephone Encounter (Signed)
Attempted to call patient. [facility] 09/06/2019

## 2019-09-06 NOTE — Progress Notes (Signed)
Progress Note  Patient Name: Cynthia Singh Date of Encounter: 09/06/2019  Primary Cardiologist: Johnsie Cancel  Subjective   Doing okay.  Denies chest pain, no acute events overnight.  Inpatient Medications    Scheduled Meds: . aspirin EC  81 mg Oral Daily  . atorvastatin  40 mg Oral Daily  . carvedilol  12.5 mg Oral BID WC  . clopidogrel  75 mg Oral Daily  . losartan  100 mg Oral Daily   And  . hydrochlorothiazide  12.5 mg Oral Daily  . insulin aspart  0-15 Units Subcutaneous TID WC  . insulin glargine  20 Units Subcutaneous QHS  . nortriptyline  150 mg Oral QHS  . polyethylene glycol  17 g Oral Daily   Continuous Infusions: . heparin 1,200 Units/hr (09/05/19 1956)   PRN Meds: acetaminophen, ALPRAZolam, morphine injection, nitroGLYCERIN, ondansetron (ZOFRAN) IV, oxyCODONE-acetaminophen   Vital Signs    Vitals:   09/05/19 2335 09/06/19 0221 09/06/19 0355 09/06/19 0736  BP: (!) 152/83  (!) 112/52 133/63  Pulse: 72  60 61  Resp: 20  19 19   Temp: 98.1 F (36.7 C)  98.4 F (36.9 C) 98.2 F (36.8 C)  TempSrc:   Oral Oral  SpO2: 98%  97% 94%  Weight:  82.9 kg    Height:        Intake/Output Summary (Last 24 hours) at 09/06/2019 0821 Last data filed at 09/06/2019 0356 Gross per 24 hour  Intake 589.04 ml  Output 950 ml  Net -360.96 ml   Last 3 Weights 09/06/2019 09/05/2019 09/04/2019  Weight (lbs) 182 lb 12.8 oz 184 lb 189 lb  Weight (kg) 82.918 kg 83.462 kg 85.73 kg      Telemetry    Sinus rhythm- Personally Reviewed  ECG    No new EKG obtained- Personally Reviewed  Physical Exam   GEN: No acute distress.   Neck: No JVD Cardiac: RRR, no murmurs, rubs, or gallops.  Respiratory: Clear to auscultation bilaterally. GI: Soft, nontender, non-distended  MS: No edema; No deformity. Neuro:  Nonfocal  Psych: Normal affect   Labs    High Sensitivity Troponin:   Recent Labs  Lab 09/04/19 1930 09/05/19 0859 09/05/19 1247 09/05/19 2241 09/06/19 0205    TROPONINIHS 23* 182* 111* 150* 145*      Chemistry Recent Labs  Lab 09/04/19 1726 09/05/19 0549  NA 137 138  K 3.9 4.0  CL 105 102  CO2 24 28  GLUCOSE 369* 224*  BUN 10 10  CREATININE 0.70 0.72  CALCIUM 8.6* 9.5  PROT 6.4*  --   ALBUMIN 3.4*  --   AST 20  --   ALT 14  --   ALKPHOS 74  --   BILITOT 0.8  --   GFRNONAA >60 >60  GFRAA >60 >60  ANIONGAP 8 8     Hematology Recent Labs  Lab 09/04/19 2228 09/05/19 0549 09/06/19 0205  WBC 10.9* 13.5* 12.5*  RBC 4.49 4.46 4.82  HGB 13.1 13.3 14.2  HCT 39.1 37.7 41.1  MCV 87.1 84.5 85.3  MCH 29.2 29.8 29.5  MCHC 33.5 35.3 34.5  RDW 12.2 12.4 12.7  PLT 219 213 224    BNPNo results for input(s): BNP, PROBNP in the last 168 hours.   DDimer No results for input(s): DDIMER in the last 168 hours.   Radiology    CT Head Wo Contrast  Result Date: 09/04/2019 CLINICAL DATA:  Chest pain radiating to the left arm. History of subdural hematoma  previously. EXAM: CT HEAD WITHOUT CONTRAST TECHNIQUE: Contiguous axial images were obtained from the base of the skull through the vertex without intravenous contrast. COMPARISON:  06/22/2017 FINDINGS: Brain: Mild age related volume loss. No evidence of old or acute infarction, mass lesion, hemorrhage, hydrocephalus or extra-axial collection. Vascular: There is atherosclerotic calcification of the major vessels at the base of the brain. Skull: Negative Sinuses/Orbits: Clear/normal Other: None IMPRESSION: No acute finding by CT. Mild age related volume loss. Atherosclerotic calcification of the major vessels at the base of the brain. Electronically Signed   By: Nelson Chimes M.D.   On: 09/04/2019 20:40   DG Chest Portable 1 View  Result Date: 09/04/2019 CLINICAL DATA:  Chest pain radiating to the left arm. EXAM: PORTABLE CHEST 1 VIEW COMPARISON:  06/26/2016 FINDINGS: Artifact overlies the chest. Heart size within normal limits. Coronary stents. Aortic atherosclerosis. Pulmonary venous  hypertension without frank edema. No infiltrate, collapse or effusion. No acute bone finding. IMPRESSION: Coronary stents. Aortic atherosclerosis. Pulmonary venous hypertension without frank edema. Electronically Signed   By: Nelson Chimes M.D.   On: 09/04/2019 18:09   CT ANGIO CHEST AORTA W/CM & OR WO/CM  Result Date: 09/04/2019 CLINICAL DATA:  Pain radiating to the back of the chest and left arm. Assess for dissection. EXAM: CT ANGIOGRAPHY CHEST WITH CONTRAST TECHNIQUE: Multidetector CT imaging of the chest was performed using the standard protocol during bolus administration of intravenous contrast. Multiplanar CT image reconstructions and MIPs were obtained to evaluate the vascular anatomy. CONTRAST:  124mL OMNIPAQUE IOHEXOL 350 MG/ML SOLN COMPARISON:  Chest radiography same day. CT 10/19/2012. FINDINGS: Cardiovascular: Heart size mildly enlarged. Extensive coronary artery calcification. There is either motion artifact or some chronic thrombus along the right lateral edge of the left atrium. Focal calcification along the left edge could be focal calcification in the mitral valve annulus or could be dystrophic muscular calcification. Echocardiography could evaluate these findings. The ascending aorta shows a maximal diameter of 3.7 cm. No ascending aortic dissection. The descending aorta has fairly considerable soft and calcified plaque with some irregularity/ulceration. No actual dissection however. Mediastinum/Nodes: No mediastinal or hilar mass or lymphadenopathy. Lungs/Pleura: Lung parenchyma is clear. No mass, nodule, infiltrate or collapse. No pulmonary edema. No pleural effusion. Upper Abdomen: Negative Musculoskeletal: Bridging osteophytes suggesting diffuse idiopathic skeletal hyperostosis. Superior endplate depression at 624THL. Vacuum phenomenon in the disc suggests that this could be an acute or subacute superior endplate fracture. Review of the MIP images confirms the above findings. IMPRESSION: 1.  No type A dissection. Intimal thickening with calcified and soft plaque in the descending thoracic aorta which I think is a manifestation of atherosclerotic disease, possibly with some ulceration, rather than a thrombosed false lumen. Ascending aorta diameter 3.7 cm Recommend annual imaging followup by CTA or MRA. This recommendation follows 2010 ACCF/AHA/AATS/ACR/ASA/SCA/SCAI/SIR/STS/SVM Guidelines for the Diagnosis and Management of Patients with Thoracic Aortic Disease. Circulation.2010; [id number]. Aortic aneurysm NOS (ICD10-I71.9) 2. Extensive coronary artery calcification. Some motion artifact or some chronic thrombus along the right lateral edge of the left atrium. Focal calcification along the left edge could be focal calcification in the mitral valve annulus or could be dystrophic muscular calcification. Echocardiography could evaluate these findings. 3. Superior endplate depression at 624THL. Vacuum phenomenon in the disc suggests that this could be an acute or subacute superior endplate fracture. Electronically Signed   By: Nelson Chimes M.D.   On: 09/04/2019 20:38   ECHOCARDIOGRAM COMPLETE  Result Date: 09/06/2019    ECHOCARDIOGRAM REPORT  Patient Name:   Burley Saver Date of Exam: 09/05/2019 Medical Rec #:  [id number]        Height:       68.0 in Accession #:    [id number]       Weight:       189.0 lb Date of Birth:  05-03-1941       BSA:          1.995 m Patient Age:    28 years         BP:           149/78 mmHg Patient Gender: F                HR:           69 bpm. Exam Location:  [facility] Procedure: 2D Echo Indications:     NSTEMI I21.4  History:         Patient has no prior history of Echocardiogram examinations.                  Acute MI; Risk Factors:Hypertension, Diabetes and Dyslipidemia.  Sonographer:     Avanell Shackleton Referring Phys:  [id number] CHRISTOPHER END Diagnosing Phys: Nelva Bush MD  Sonographer Comments: Technically difficult study due to poor echo windows. IMPRESSIONS  1. Left  ventricular ejection fraction, by estimation, is 60 to 65%. The left ventricle has normal function. Left ventricular endocardial border not optimally defined to evaluate regional wall motion. Left ventricular diastolic parameters are indeterminate.  2. Right ventricular systolic function is normal. The right ventricular size is normal. Mildly increased right ventricular wall thickness.  3. The mitral valve was not well visualized. Trivial mitral valve regurgitation. No evidence of mitral stenosis.  4. The aortic valve was not well visualized. Aortic valve regurgitation is not visualized. No aortic stenosis is present. FINDINGS  Left Ventricle: Unable to accurately assess left ventricular size and wall thickness. Left ventricular ejection fraction, by estimation, is 60 to 65%. The left ventricle has normal function. Left ventricular endocardial border not optimally defined to evaluate regional wall motion. Definity contrast agent was given IV to delineate the left ventricular endocardial borders. Left ventricular diastolic parameters are indeterminate. Right Ventricle: The right ventricular size is normal. Mildly increased right ventricular wall thickness. Right ventricular systolic function is normal. Left Atrium: Left atrial size was normal in size. Right Atrium: Right atrial size was normal in size. Pericardium: The pericardium was not well visualized. Mitral Valve: The mitral valve was not well visualized. Moderate mitral annular calcification. Trivial mitral valve regurgitation. No evidence of mitral valve stenosis. Tricuspid Valve: The tricuspid valve is not well visualized. Tricuspid valve regurgitation is trivial. Aortic Valve: The aortic valve was not well visualized. . There is mild thickening and mild calcification of the aortic valve. Aortic valve regurgitation is not visualized. No aortic stenosis is present. There is mild thickening of the aortic valve. There is mild calcification of the aortic valve.  Aortic valve mean gradient measures 8.0 mmHg. Aortic valve peak gradient measures 12.7 mmHg. Aortic valve area, by VTI measures 1.64 cm. Pulmonic Valve: The pulmonic valve was not well visualized. Pulmonic valve regurgitation is not visualized. No evidence of pulmonic stenosis. Aorta: The aortic root was not well visualized. Pulmonary Artery: The pulmonary artery is not well seen. Venous: The inferior vena cava was not well visualized. IAS/Shunts: The interatrial septum was not well visualized.  LEFT VENTRICLE PLAX 2D LVOT diam:     2.00 cm LV SV:  58 LV SV Index:   29 LVOT Area:     3.14 cm  RIGHT VENTRICLE             IVC RV S prime:     13.70 cm/s  IVC diam: 1.43 cm TAPSE (M-mode): 2.2 cm LEFT ATRIUM             Index       RIGHT ATRIUM           Index LA Vol (A2C):   61.0 ml 30.57 ml/m RA Area:     12.90 cm LA Vol (A4C):   52.7 ml 26.41 ml/m RA Volume:   30.30 ml  15.19 ml/m LA Biplane Vol: 56.8 ml 28.47 ml/m  AORTIC VALVE AV Area (Vmax):    1.76 cm AV Area (Vmean):   1.70 cm AV Area (VTI):     1.64 cm AV Vmax:           178.50 cm/s AV Vmean:          135.000 cm/s AV VTI:            0.354 m AV Peak Grad:      12.7 mmHg AV Mean Grad:      8.0 mmHg LVOT Vmax:         99.80 cm/s LVOT Vmean:        73.100 cm/s LVOT VTI:          0.185 m LVOT/AV VTI ratio: 0.52 MITRAL VALVE MV Area (PHT): 1.65 cm     SHUNTS MV Decel Time: 461 msec     Systemic VTI:  0.18 m MV E velocity: 79.60 cm/s   Systemic Diam: 2.00 cm MV A velocity: 144.00 cm/s MV E/A ratio:  0.55 Harrell Gave End MD Electronically signed by Nelva Bush MD Signature Date/Time: 09/06/2019/6:52:56 AM    Final     Cardiac Studies   Echo 09/05/2019 1. Left ventricular ejection fraction, by estimation, is 60 to 65%. The  left ventricle has normal function. Left ventricular endocardial border  not optimally defined to evaluate regional wall motion. Left ventricular  diastolic parameters are  indeterminate.  2. Right ventricular systolic  function is normal. The right ventricular  size is normal. Mildly increased right ventricular wall thickness.  3. The mitral valve was not well visualized. Trivial mitral valve  regurgitation. No evidence of mitral stenosis.  4. The aortic valve was not well visualized. Aortic valve regurgitation  is not visualized. No aortic stenosis is present.   Patient Profile     79 y.o. female with a hx of CAD s/p RCA stenting in 2014 and PCI/DES to the LAD with POBA to the D1 in 01/2015, poorly controlled DM, bilateral SDH in the setting of a mechanical fall in 2018 while on DAPT, HTN, HLD,a nd OA who is being seen due to chest pain and elevated troponins.  Assessment & Plan    1.  NSTEMI, elevated troponins -Echocardiogram with no wall motion abnormalities, normal systolic function. -Patient previously denied left heart cath. -Plan for Myoview today.  Patient is now willing to perform left heart cath if Myoview is abnormal. -Continue aspirin Plavix  2.  Hypertension -BP controlled -Continue Coreg, HCTZ, losartan      Signed, Kate Sable, MD  09/06/2019, 8:21 AM

## 2019-09-06 NOTE — Progress Notes (Signed)
Physician Discharge Summary  Cynthia Singh [id number] DOB: 11/13/1940 DOA: 09/04/2019  PCP: Venia Carbon, MD  Admit date: 09/04/2019 Discharge date: 09/06/2019  Admitted From: Home Disposition:  Home  Recommendations for Outpatient Follow-up:  1. Follow up with PCP in 1-2 weeks 2. Follow-up with cardiology. 3. Please obtain BMP/CBC in one week Please follow up on the following pending results: None  Home Health: No Equipment/Devices: None  discharge Condition: Stable CODE STATUS: DNR Diet recommendation: Heart Healthy / Carb Modified   Brief/Interim Summary: Cynthia Singh is an 79 y.o. female with medical history significant forCAD with history of stents to the RCA 2014, LAD in 2016 as well as history of diabetes and hypertension who presents to the emergency room with severe midsternal chest pain10/10radiating to her shoulders and back that started a couple hours prior to arrival.  She had elevated troponins.  She was admitted to the hospital for acute NSTEMI.  She was treated with IV heparin infusion and cardiologist was consulted.  Left heart cath was offered but patient declined.  Cardiologist recommended medical management.  Echocardiogram was in sufficient for wall motion abnormalities and inconclusive coercive for diastolic dysfunction.  A nuclear stress test was done which was low risk for ischemia.  Troponin peaked at 182 and then trending down. Her chest pain resolved, she continued to experience left arm pain which seems more like musculoskeletal.  Patient also has an history of arthritis and use Percocet for her pain.  After discussing with cardiologist she will be discharged to follow-up with them as an outpatient. She also advised to use Percocet and see if that will help with her left arm pain and follow-up with her primary care physician for further management.  She will continue rest of her home meds.  Discharge Diagnoses:  Active Problems:   Unstable  angina (HCC)   CAD S/P percutaneous coronary angioplasty/DES (RCA-2014/LAD-2016)   Hypertensive urgency   Hyperglycemia due to type 2 diabetes mellitus (HCC)   Non-ST elevation (NSTEMI) myocardial infarction [facility])   NSTEMI (non-ST elevated myocardial infarction) [facility])  Discharge Instructions  Discharge Instructions    Diet - low sodium heart healthy   Complete by: As directed    Discharge instructions   Complete by: As directed    It was pleasure taking care of you. Your stress test did not show any cardiac reason for your pain.  Please follow-up with your cardiologist in 1 to 2 weeks. Your left arm pain may be due to worsening of your arthritis, see if your home dose of Percocet helps and follow-up with your primary care physician.   Increase activity slowly   Complete by: As directed      Allergies as of 09/06/2019   No Known Allergies     Medication List    TAKE these medications   aspirin EC 81 MG tablet Take 81 mg by mouth daily.   atorvastatin 80 MG tablet Commonly known as: LIPITOR TAKE 1/2 TABLETS (40 MG TOTAL) BY MOUTH DAILY.   carvedilol 12.5 MG tablet Commonly known as: COREG Take 1 tablet (12.5 mg total) by mouth 2 (two) times daily with a meal.   clopidogrel 75 MG tablet Commonly known as: PLAVIX TAKE 1 TABLET BY MOUTH EVERY DAY   Cranberry 500 MG Caps Take 1,000 mg by mouth daily.   FISH OIL PO Take 1 capsule by mouth daily.   Jardiance 25 MG Tabs tablet Generic drug: empagliflozin Take 25 mg by mouth daily before breakfast.  Lantus SoloStar 100 UNIT/ML Solostar Pen Generic drug: insulin glargine TAKE 36 UNITS DAILY UNDER SKIN   losartan-hydrochlorothiazide 100-12.5 MG tablet Commonly known as: HYZAAR Take 1 tablet by mouth daily.   metFORMIN 500 MG 24 hr tablet Commonly known as: GLUCOPHAGE-XR TAKE 2 TABLETS BY MOUTH EVERY DAY WITH BREAKFAST   nitroGLYCERIN 0.4 MG SL tablet Commonly known as: Nitrostat Place 1 tablet (0.4 mg total)  under the tongue every 5 (five) minutes as needed for chest pain.   nortriptyline 50 MG capsule Commonly known as: PAMELOR TAKE 3 CAPSULES (150 MG TOTAL) BY MOUTH AT BEDTIME.   oxyCODONE-acetaminophen 10-325 MG tablet Commonly known as: PERCOCET Take 1-2 tablets by mouth every 6 (six) hours as needed for pain. M54.9   Pen Needles 32G X 5 MM Misc 1 Units by Does not apply route daily.   polyethylene glycol 17 g packet Commonly known as: MIRALAX / GLYCOLAX Take 17 g by mouth daily as needed.       No Known Allergies  Consultations:  Cardiology  Procedures/Studies: CT Head Wo Contrast  Result Date: 09/04/2019 CLINICAL DATA:  Chest pain radiating to the left arm. History of subdural hematoma previously. EXAM: CT HEAD WITHOUT CONTRAST TECHNIQUE: Contiguous axial images were obtained from the base of the skull through the vertex without intravenous contrast. COMPARISON:  06/22/2017 FINDINGS: Brain: Mild age related volume loss. No evidence of old or acute infarction, mass lesion, hemorrhage, hydrocephalus or extra-axial collection. Vascular: There is atherosclerotic calcification of the major vessels at the base of the brain. Skull: Negative Sinuses/Orbits: Clear/normal Other: None IMPRESSION: No acute finding by CT. Mild age related volume loss. Atherosclerotic calcification of the major vessels at the base of the brain. Electronically Signed   By: Nelson Chimes M.D.   On: 09/04/2019 20:40   NM Myocar Multi W/Spect W/Wall Motion / EF  Result Date: 09/06/2019 Pharmacological myocardial perfusion imaging study with no significant  ischemia Normal wall motion, EF estimated at 39% (depressed ejection fraction likely secondary to GI uptake artifact) No EKG changes concerning for ischemia at peak stress or in recovery. Low risk scan Signed, Esmond Plants, MD, Ph.D [facility]   DG Chest Portable 1 View  Result Date: 09/04/2019 CLINICAL DATA:  Chest pain radiating to the left arm. EXAM:  PORTABLE CHEST 1 VIEW COMPARISON:  06/26/2016 FINDINGS: Artifact overlies the chest. Heart size within normal limits. Coronary stents. Aortic atherosclerosis. Pulmonary venous hypertension without frank edema. No infiltrate, collapse or effusion. No acute bone finding. IMPRESSION: Coronary stents. Aortic atherosclerosis. Pulmonary venous hypertension without frank edema. Electronically Signed   By: Nelson Chimes M.D.   On: 09/04/2019 18:09   CT ANGIO CHEST AORTA W/CM & OR WO/CM  Result Date: 09/04/2019 CLINICAL DATA:  Pain radiating to the back of the chest and left arm. Assess for dissection. EXAM: CT ANGIOGRAPHY CHEST WITH CONTRAST TECHNIQUE: Multidetector CT imaging of the chest was performed using the standard protocol during bolus administration of intravenous contrast. Multiplanar CT image reconstructions and MIPs were obtained to evaluate the vascular anatomy. CONTRAST:  13mL OMNIPAQUE IOHEXOL 350 MG/ML SOLN COMPARISON:  Chest radiography same day. CT 10/19/2012. FINDINGS: Cardiovascular: Heart size mildly enlarged. Extensive coronary artery calcification. There is either motion artifact or some chronic thrombus along the right lateral edge of the left atrium. Focal calcification along the left edge could be focal calcification in the mitral valve annulus or could be dystrophic muscular calcification. Echocardiography could evaluate these findings. The ascending aorta shows a maximal diameter  of 3.7 cm. No ascending aortic dissection. The descending aorta has fairly considerable soft and calcified plaque with some irregularity/ulceration. No actual dissection however. Mediastinum/Nodes: No mediastinal or hilar mass or lymphadenopathy. Lungs/Pleura: Lung parenchyma is clear. No mass, nodule, infiltrate or collapse. No pulmonary edema. No pleural effusion. Upper Abdomen: Negative Musculoskeletal: Bridging osteophytes suggesting diffuse idiopathic skeletal hyperostosis. Superior endplate depression at 624THL.  Vacuum phenomenon in the disc suggests that this could be an acute or subacute superior endplate fracture. Review of the MIP images confirms the above findings. IMPRESSION: 1. No type A dissection. Intimal thickening with calcified and soft plaque in the descending thoracic aorta which I think is a manifestation of atherosclerotic disease, possibly with some ulceration, rather than a thrombosed false lumen. Ascending aorta diameter 3.7 cm Recommend annual imaging followup by CTA or MRA. This recommendation follows 2010 ACCF/AHA/AATS/ACR/ASA/SCA/SCAI/SIR/STS/SVM Guidelines for the Diagnosis and Management of Patients with Thoracic Aortic Disease. Circulation.2010; 121ML:4928372. Aortic aneurysm NOS (ICD10-I71.9) 2. Extensive coronary artery calcification. Some motion artifact or some chronic thrombus along the right lateral edge of the left atrium. Focal calcification along the left edge could be focal calcification in the mitral valve annulus or could be dystrophic muscular calcification. Echocardiography could evaluate these findings. 3. Superior endplate depression at 624THL. Vacuum phenomenon in the disc suggests that this could be an acute or subacute superior endplate fracture. Electronically Signed   By: Nelson Chimes M.D.   On: 09/04/2019 20:38   ECHOCARDIOGRAM COMPLETE  Result Date: 09/06/2019    ECHOCARDIOGRAM REPORT   Patient Name:   Cynthia Singh Date of Exam: 09/05/2019 Medical Rec #:  [id number]        Height:       68.0 in Accession #:    [id number]       Weight:       189.0 lb Date of Birth:  09-12-1940       BSA:          1.995 m Patient Age:    33 years         BP:           149/78 mmHg Patient Gender: F                HR:           69 bpm. Exam Location:  [facility] Procedure: 2D Echo Indications:     NSTEMI I21.4  History:         Patient has no prior history of Echocardiogram examinations.                  Acute MI; Risk Factors:Hypertension, Diabetes and Dyslipidemia.  Sonographer:     Avanell Shackleton Referring Phys:  [id number] CHRISTOPHER END Diagnosing Phys: Nelva Bush MD  Sonographer Comments: Technically difficult study due to poor echo windows. IMPRESSIONS  1. Left ventricular ejection fraction, by estimation, is 60 to 65%. The left ventricle has normal function. Left ventricular endocardial border not optimally defined to evaluate regional wall motion. Left ventricular diastolic parameters are indeterminate.  2. Right ventricular systolic function is normal. The right ventricular size is normal. Mildly increased right ventricular wall thickness.  3. The mitral valve was not well visualized. Trivial mitral valve regurgitation. No evidence of mitral stenosis.  4. The aortic valve was not well visualized. Aortic valve regurgitation is not visualized. No aortic stenosis is present. FINDINGS  Left Ventricle: Unable to accurately assess left ventricular size and wall thickness. Left ventricular ejection fraction,  by estimation, is 60 to 65%. The left ventricle has normal function. Left ventricular endocardial border not optimally defined to evaluate regional wall motion. Definity contrast agent was given IV to delineate the left ventricular endocardial borders. Left ventricular diastolic parameters are indeterminate. Right Ventricle: The right ventricular size is normal. Mildly increased right ventricular wall thickness. Right ventricular systolic function is normal. Left Atrium: Left atrial size was normal in size. Right Atrium: Right atrial size was normal in size. Pericardium: The pericardium was not well visualized. Mitral Valve: The mitral valve was not well visualized. Moderate mitral annular calcification. Trivial mitral valve regurgitation. No evidence of mitral valve stenosis. Tricuspid Valve: The tricuspid valve is not well visualized. Tricuspid valve regurgitation is trivial. Aortic Valve: The aortic valve was not well visualized. . There is mild thickening and mild calcification of the aortic  valve. Aortic valve regurgitation is not visualized. No aortic stenosis is present. There is mild thickening of the aortic valve. There is mild calcification of the aortic valve. Aortic valve mean gradient measures 8.0 mmHg. Aortic valve peak gradient measures 12.7 mmHg. Aortic valve area, by VTI measures 1.64 cm. Pulmonic Valve: The pulmonic valve was not well visualized. Pulmonic valve regurgitation is not visualized. No evidence of pulmonic stenosis. Aorta: The aortic root was not well visualized. Pulmonary Artery: The pulmonary artery is not well seen. Venous: The inferior vena cava was not well visualized. IAS/Shunts: The interatrial septum was not well visualized.  LEFT VENTRICLE PLAX 2D LVOT diam:     2.00 cm LV SV:         58 LV SV Index:   29 LVOT Area:     3.14 cm  RIGHT VENTRICLE             IVC RV S prime:     13.70 cm/s  IVC diam: 1.43 cm TAPSE (M-mode): 2.2 cm LEFT ATRIUM             Index       RIGHT ATRIUM           Index LA Vol (A2C):   61.0 ml 30.57 ml/m RA Area:     12.90 cm LA Vol (A4C):   52.7 ml 26.41 ml/m RA Volume:   30.30 ml  15.19 ml/m LA Biplane Vol: 56.8 ml 28.47 ml/m  AORTIC VALVE AV Area (Vmax):    1.76 cm AV Area (Vmean):   1.70 cm AV Area (VTI):     1.64 cm AV Vmax:           178.50 cm/s AV Vmean:          135.000 cm/s AV VTI:            0.354 m AV Peak Grad:      12.7 mmHg AV Mean Grad:      8.0 mmHg LVOT Vmax:         99.80 cm/s LVOT Vmean:        73.100 cm/s LVOT VTI:          0.185 m LVOT/AV VTI ratio: 0.52 MITRAL VALVE MV Area (PHT): 1.65 cm     SHUNTS MV Decel Time: 461 msec     Systemic VTI:  0.18 m MV E velocity: 79.60 cm/s   Systemic Diam: 2.00 cm MV A velocity: 144.00 cm/s MV E/A ratio:  0.55 Harrell Gave End MD Electronically signed by Nelva Bush MD Signature Date/Time: 09/06/2019/6:52:56 AM    Final      Subjective: Patient denies any  chest pain when seen during morning rounds.  Continues to have left arm pain which increased with moving her  arm.  Discharge Exam: Vitals:   09/06/19 0736 09/06/19 1153  BP: 133/63 (!) 155/65  Pulse: 61 67  Resp: 19 19  Temp: 98.2 F (36.8 C) 98.9 F (37.2 C)  SpO2: 94% 96%   Vitals:   09/06/19 0221 09/06/19 0355 09/06/19 0736 09/06/19 1153  BP:  (!) 112/52 133/63 (!) 155/65  Pulse:  60 61 67  Resp:  19 19 19   Temp:  98.4 F (36.9 C) 98.2 F (36.8 C) 98.9 F (37.2 C)  TempSrc:  Oral Oral Oral  SpO2:  97% 94% 96%  Weight: 82.9 kg     Height:        General: Pt is alert, awake, not in acute distress Cardiovascular: RRR, S1/S2 +, no rubs, no gallops Respiratory: CTA bilaterally, no wheezing, no rhonchi Abdominal: Soft, NT, ND, bowel sounds + Extremities: no edema, no cyanosis   The results of significant diagnostics from this hospitalization (including imaging, microbiology, ancillary and laboratory) are listed below for reference.    Microbiology: Recent Results (from the past 240 hour(s))  SARS CORONAVIRUS 2 (TAT 6-24 HRS) Nasopharyngeal Nasopharyngeal Swab     Status: None   Collection Time: 09/04/19 11:27 PM   Specimen: Nasopharyngeal Swab  Result Value Ref Range Status   SARS Coronavirus 2 NEGATIVE NEGATIVE Final    Comment: (NOTE) SARS-CoV-2 target nucleic acids are NOT DETECTED. The SARS-CoV-2 RNA is generally detectable in upper and lower respiratory specimens during the acute phase of infection. Negative results do not preclude SARS-CoV-2 infection, do not rule out co-infections with other pathogens, and should not be used as the sole basis for treatment or other patient management decisions. Negative results must be combined with clinical observations, patient history, and epidemiological information. The expected result is Negative. Fact Sheet for Patients: SugarRoll.be Fact Sheet for Healthcare Providers: https://www.woods-mathews.com/ This test is not yet approved or cleared by the [place] FDA and  has been  authorized for detection and/or diagnosis of SARS-CoV-2 by FDA under an Emergency Use Authorization (EUA). This EUA will remain  in effect (meaning this test can be used) for the duration of the COVID-19 declaration under Section 56 4(b)(1) of the Act, 21 U.S.C. section 360bbb-3(b)(1), unless the authorization is terminated or revoked sooner. Performed at [facility], [place] [address], [place], [place] [address]      Labs: BNP (last 3 results) No results for input(s): BNP in the last 8760 hours. Basic Metabolic Panel: Recent Labs  Lab 09/04/19 1726 09/05/19 0549  NA 137 138  K 3.9 4.0  CL 105 102  CO2 24 28  GLUCOSE 369* 224*  BUN 10 10  CREATININE 0.70 0.72  CALCIUM 8.6* 9.5   Liver Function Tests: Recent Labs  Lab 09/04/19 1726  AST 20  ALT 14  ALKPHOS 74  BILITOT 0.8  PROT 6.4*  ALBUMIN 3.4*   No results for input(s): LIPASE, AMYLASE in the last 168 hours. No results for input(s): AMMONIA in the last 168 hours. CBC: Recent Labs  Lab 09/04/19 1726 09/04/19 2228 09/05/19 0549 09/06/19 0205  WBC 10.3 10.9* 13.5* 12.5*  HGB 13.6 13.1 13.3 14.2  HCT 40.5 39.1 37.7 41.1  MCV 89.0 87.1 84.5 85.3  PLT 213 219 213 224   Cardiac Enzymes: No results for input(s): CKTOTAL, CKMB, CKMBINDEX, TROPONINI in the last 168 hours. BNP: Invalid input(s): POCBNP CBG: Recent  Labs  Lab 09/05/19 0903 09/05/19 1610 09/05/19 2115 09/06/19 0735 09/06/19 1151  GLUCAP 237* 303* 249* 173* 220*   D-Dimer No results for input(s): DDIMER in the last 72 hours. Hgb A1c No results for input(s): HGBA1C in the last 72 hours. Lipid Profile Recent Labs    09/05/19 0549  CHOL 158  HDL 35*  LDLCALC 62  TRIG 305*  CHOLHDL 4.5   Thyroid function studies No results for input(s): TSH, T4TOTAL, T3FREE, THYROIDAB in the last 72 hours.  Invalid input(s): FREET3 Anemia work up No results for input(s): VITAMINB12, FOLATE, FERRITIN, TIBC, IRON, RETICCTPCT in the last 72  hours. Urinalysis    Component Value Date/Time   COLORURINE YELLOW (A) 06/22/2017 1129   APPEARANCEUR CLEAR (A) 06/22/2017 1129   LABSPEC 1.014 06/22/2017 1129   PHURINE 5.0 06/22/2017 1129   GLUCOSEU >=500 (A) 06/22/2017 1129   HGBUR NEGATIVE 06/22/2017 1129   BILIRUBINUR neg 06/14/2019 1408   KETONESUR 5 (A) 06/22/2017 1129   PROTEINUR Negative 06/14/2019 1408   PROTEINUR NEGATIVE 06/22/2017 1129   UROBILINOGEN 0.2 06/14/2019 1408   UROBILINOGEN 1.0 10/19/2012 1030   NITRITE neg 06/14/2019 1408   NITRITE NEGATIVE 06/22/2017 1129   LEUKOCYTESUR Negative 06/14/2019 1408   Sepsis Labs Invalid input(s): PROCALCITONIN,  WBC,  LACTICIDVEN Microbiology Recent Results (from the past 240 hour(s))  SARS CORONAVIRUS 2 (TAT 6-24 HRS) Nasopharyngeal Nasopharyngeal Swab     Status: None   Collection Time: 09/04/19 11:27 PM   Specimen: Nasopharyngeal Swab  Result Value Ref Range Status   SARS Coronavirus 2 NEGATIVE NEGATIVE Final    Comment: (NOTE) SARS-CoV-2 target nucleic acids are NOT DETECTED. The SARS-CoV-2 RNA is generally detectable in upper and lower respiratory specimens during the acute phase of infection. Negative results do not preclude SARS-CoV-2 infection, do not rule out co-infections with other pathogens, and should not be used as the sole basis for treatment or other patient management decisions. Negative results must be combined with clinical observations, patient history, and epidemiological information. The expected result is Negative. Fact Sheet for Patients: SugarRoll.be Fact Sheet for Healthcare Providers: https://www.woods-mathews.com/ This test is not yet approved or cleared by the [place] FDA and  has been authorized for detection and/or diagnosis of SARS-CoV-2 by FDA under an Emergency Use Authorization (EUA). This EUA will remain  in effect (meaning this test can be used) for the duration of the COVID-19  declaration under Section 56 4(b)(1) of the Act, 21 U.S.C. section 360bbb-3(b)(1), unless the authorization is terminated or revoked sooner. Performed at [facility], [place] [address] [place], [place] [address]     Time coordinating discharge: Over 30 minutes  SIGNED:  Lorella Nimrod, MD  [facility] 09/06/2019, 2:42 PM  If 7PM-7AM, please contact night-coverage [email or web address]  This record has been created using Systems analyst. Errors have been sought and corrected,but may not always be located. Such creation errors do not reflect on the standard of care.

## 2019-09-06 NOTE — Consult Note (Addendum)
ANTICOAGULATION CONSULT NOTE - Follow Up Consult  Pharmacy Consult for Heparin Infusion  Indication: chest pain/ACS  No Known Allergies  Patient Measurements: Height: 5\' 11"  (180.3 cm) Weight: 82.9 kg (182 lb 12.8 oz) IBW/kg (Calculated) : 70.8 Heparin Dosing Weight: 81kg  Vital Signs: Temp: 98.1 F (36.7 C) (04/20 2335) BP: 152/83 (04/20 2335) Pulse Rate: 72 (04/20 2335)  Labs: Recent Labs    09/04/19 1726 09/04/19 1930 09/04/19 2228 09/04/19 2228 09/05/19 0549 09/05/19 0859 09/05/19 1247 09/05/19 1545 09/05/19 2241 09/06/19 0205  HGB 13.6   < > 13.1   < > 13.3  --   --   --   --  14.2  HCT 40.5   < > 39.1  --  37.7  --   --   --   --  41.1  PLT 213   < > 219  --  213  --   --   --   --  224  APTT  --   --  31  --   --   --   --   --   --   --   LABPROT  --   --  13.0  --   --   --   --   --   --   --   INR  --   --  1.0  --   --   --   --   --   --   --   HEPARINUNFRC  --   --   --   --  0.29*  --   --  0.29*  --  0.50  CREATININE 0.70  --   --   --  0.72  --   --   --   --   --   TROPONINIHS 6   < >  --   --   --  182* 111*  --  150*  --    < > = values in this interval not displayed.    Estimated Creatinine Clearance: 66.9 mL/min (by C-G formula based on SCr of 0.72 mg/dL).   Assessment: Pharmacy consulted for heparin infusion dosing and monitoring in 79 yo female with chest pain/ACS.   4/20 0549 HL 0.29 Will increase rate to 1050 units/hr 4/20 1545 HL 0.29 Will increase rate to 1200 units/hr  Goal of Therapy:  Heparin level 0.3-0.7 units/ml Monitor platelets by anticoagulation protocol: Yes   Plan:  04/21 @ 0200 HL 0.50 therapeutic. Will continue current rate and will recheck HL at 1000, CBC stable will continue to monitor.  Tobie Lords, PharmD, BCPS Clinical Pharmacist 09/06/2019 3:12 AM

## 2019-09-06 NOTE — Telephone Encounter (Signed)
-----   Message from Rise Mu, PA-C sent at 09/06/2019  2:39 PM EDT ----- Can you get her set up to see me next week for hospital follow up?  Thanks!

## 2019-09-07 ENCOUNTER — Telehealth: Payer: Self-pay

## 2019-09-07 ENCOUNTER — Telehealth: Payer: Self-pay | Admitting: Internal Medicine

## 2019-09-07 NOTE — Telephone Encounter (Signed)
2nd and final attempt- Called patient to complete TCM and schedule hospital follow up. Patient never answered and voicemail box has not been setup. Unable to leave message.

## 2019-09-07 NOTE — Progress Notes (Signed)
Cardiology Office Note    Date:  09/12/2019   ID:  Cynthia Singh, DOB Oct 09, 1940, MRN [id number]  PCP:  Venia Carbon, MD  Cardiologist:  Jenkins Rouge, MD  Electrophysiologist:  None   Chief Complaint: Hospital follow up  History of Present Illness:   Cynthia Singh is a 79 y.o. female with history of CAD s/p RCA stenting in 2014 and PCI/DES to the LAD with POBA to the D1 in 01/2015, poorly controlled DM, bilateral SDH in the setting of a mechanical fall in 2018 while on DAPT, HTN, HLD, dilated ascending aorta, and OA who presents for hospital follow-up as detailed below.  She was admitted in 01/2015 with unstable angina. Diagnostic LHC at that time showed 80% proximal LAD stenosis treated with PCI/DES, 95% ostial D1 lesion treated with PTCA with 40% residual stenosis, 60% mid LCx lesion,patent RCA stent,EF 55-65%. She was seen in hospital follow up in 03/2015, though was lost to follow up thereafter until she was admitted to [facility] in 07/2016 in the setting of a mechanical fall, suffering bilateral SDH while on DAPT. Plavix was discontinued with recommendation to resume ASA when safe. She has been lost to follow up since until her recent admission to [facility] on 4/19 through 4/21 with chest, posterior neck, and thoracic back pain that began at rest while sitting on her sofa.  Initial BP was in the [id number] systolic.  Chest x-ray showed vascular congestion.  EKG showed sinus rhythm with LVH, poor R wave progression, and nonspecific ST-T changes.  CTA of the aortic showed no type A dissection with likely atherosclerotic disease with an ascending aorta measuring 3.7 cm.  High-sensitivity troponin peaked at 150.  She was placed on a heparin drip.  Initially, plans were for diagnostic LHC, however she declined this.  Echo showed an EF of 60 to 65%, normal RV systolic function and ventricular cavity size, trivial mitral regurgitation.  She underwent Lexiscan MPI on 09/06/2019 which showed no  significant ischemia and normal wall motion with an EF falsely reduced at 39% secondary to GI uptake artifact with EF noted to be normal by echo during the same admission.  Overall, this was a low risk scan.  She comes in today continuing to note bilateral arm and neck pain that are randomly occurring and will typically last for 20 to 30 minutes.  Pain is described as a deep ache.  It does not necessarily feel similar to her prior angina leading up to her previous interventions though she did have left arm pain with those episodes.  She denies any frank chest pain at this time.  Movement of her upper extremities does not reproduce her symptoms.  No associated dyspnea, palpitations, dizziness, presyncope, syncope.  No lower extremity swelling.  No falls.  At baseline, she is relatively sedentary.  She is not a formal DNR though does have a request for DNI.  She is agreeable to rescind this for the catheterization.  She is currently without symptoms.   Labs independently reviewed: 08/2019 - Hgb 14.2, PLT 224, potassium 4.0, BUN 10, serum creatinine 0.72, TC 158, TG 305, HDL 35, LDL 62, albumin 3.4, AST/ALT normal, A1c 11.8  Past Medical History:  Diagnosis Date  . CAD (coronary artery disease)    a. Remote nonobstructive disease but in 10/2012 Cath/PCI: s/p DES to RCA. b. cath 02/08/15 DES to prox LAD and balloon angioplasty of ost D1, EF 55-65%.  . Concussion    [place] AFTER FALL 3/18  .  Depression   . Diabetes mellitus, type 2 ([place])   . Dyslipidemia   . Episodic mood disorder ([place])   . Hypertension   . Myocardial infarction ([place])    2014  . Obesity   . Osteoarthritis of spine    knees also  . Peripheral vascular disease, unspecified ([place]) 2020   severe disease on home screening by insurance    Past Surgical History:  Procedure Laterality Date  . ABDOMINAL HYSTERECTOMY    . APPENDECTOMY  1959  . CARDIAC CATHETERIZATION N/A 02/08/2015   Procedure: Left Heart Cath and Coronary Angiography;   Surgeon: Sherren Mocha, MD;  Location: [place] [facility];  Service: Cardiovascular;  Laterality: N/A;  . CARDIAC CATHETERIZATION N/A 02/08/2015   Procedure: Coronary Stent Intervention;  Surgeon: Sherren Mocha, MD;  Location: [place] [facility];  Service: Cardiovascular;  Laterality: N/A;  . CARDIAC CATHETERIZATION N/A 02/08/2015   Procedure: Intravascular Pressure Wire/FFR Study;  Surgeon: Sherren Mocha, MD;  Location: [place] [facility];  Service: Cardiovascular;  Laterality: N/A;  . CATARACT EXTRACTION W/PHACO Left 11/10/2016   Procedure: CATARACT EXTRACTION PHACO AND INTRAOCULAR LENS PLACEMENT ([place]) suture placed in left eye at end of procedure;  Surgeon: Birder Robson, MD;  Location: [facility] ORS;  Service: Ophthalmology;  Laterality: Left;  [place]  3.20 AP% 27.7 CDE 55.63 Fluid pack lot # [id number]  . CHOLECYSTECTOMY    . COMBINED HYSTERECTOMY ABDOMINAL W/ A&P REPAIR / OOPHORECTOMY  1968  . CORONARY ANGIOPLASTY     STENTS X 5  . CORONARY STENT PLACEMENT  2014  . DILATION AND CURETTAGE OF UTERUS    . High [place]  . LEFT HEART CATHETERIZATION WITH CORONARY ANGIOGRAM N/A 10/20/2012   Procedure: LEFT HEART CATHETERIZATION WITH CORONARY ANGIOGRAM;  Surgeon: Sherren Mocha, MD;  Location: [facility] CATH LAB;  Service: Cardiovascular;  Laterality: N/A;  . multiple D&C    . TONSILLECTOMY  age 72    Current Medications: Current Meds  Medication Sig  . aspirin EC 81 MG tablet Take 81 mg by mouth daily.  Marland Kitchen atorvastatin (LIPITOR) 80 MG tablet TAKE 1/2 TABLETS (40 MG TOTAL) BY MOUTH DAILY.  . carvedilol (COREG) 12.5 MG tablet Take 1 tablet (12.5 mg total) by mouth 2 (two) times daily with a meal.  . clopidogrel (PLAVIX) 75 MG tablet TAKE 1 TABLET BY MOUTH EVERY DAY  . Cranberry 500 MG CAPS Take 1,000 mg by mouth daily.  . empagliflozin (JARDIANCE) 25 MG TABS tablet Take 25 mg by mouth daily before breakfast.  . Insulin Glargine (LANTUS SOLOSTAR) 100 UNIT/ML Solostar Pen TAKE 36 UNITS DAILY  UNDER SKIN  . Insulin Pen Needle (PEN NEEDLES) 32G X 5 MM MISC 1 Units by Does not apply route daily.  Marland Kitchen losartan-hydrochlorothiazide (HYZAAR) 100-12.5 MG tablet Take 1 tablet by mouth daily.  . metFORMIN (GLUCOPHAGE-XR) 500 MG 24 hr tablet TAKE 2 TABLETS BY MOUTH EVERY DAY WITH BREAKFAST  . nitroGLYCERIN (NITROSTAT) 0.4 MG SL tablet Place 1 tablet (0.4 mg total) under the tongue every 5 (five) minutes as needed for chest pain.  . nortriptyline (PAMELOR) 50 MG capsule TAKE 3 CAPSULES (150 MG TOTAL) BY MOUTH AT BEDTIME.  Marland Kitchen Omega-3 Fatty Acids (FISH OIL PO) Take 1 capsule by mouth daily.  Marland Kitchen oxyCODONE-acetaminophen (PERCOCET) 10-325 MG tablet Take 1-2 tablets by mouth every 6 (six) hours as needed for pain. M54.9  . polyethylene glycol (MIRALAX / GLYCOLAX) packet Take 17 g by mouth daily as needed.     Allergies:  Patient has no known allergies.   Social History   Socioeconomic History  . Marital status: Widowed    Spouse name: Not on file  . Number of children: 3  . Years of education: 68  . Highest education level: Not on file  Occupational History  . Occupation: Scientist, water quality  Tobacco Use  . Smoking status: Never Smoker  . Smokeless tobacco: Never Used  Substance and Sexual Activity  . Alcohol use: No  . Drug use: No  . Sexual activity: Never    Birth control/protection: Post-menopausal  Other Topics Concern  . Not on file  Social History Narrative   Widowed '13. 2 sons- '63, '66; one daughter-'69;    85 grandchildren--has raised 9 of her grandchildren 4 great grandchildren.   Lives with 2 granddaughters and adoptive son      Has living will   Probably would have son Emillee Elbert as health care POA--not set up   Would accept resuscitation but no prolonged ventilation   Not sure about tube feeds   Social Determinants of Health   Financial Resource Strain:   . Difficulty of Paying Living Expenses:   Food Insecurity:   . Worried About Charity fundraiser in the Last Year:   .  Arboriculturist in the Last Year:   Transportation Needs:   . Film/video editor (Medical):   Marland Kitchen Lack of Transportation (Non-Medical):   Physical Activity:   . Days of Exercise per Week:   . Minutes of Exercise per Session:   Stress:   . Feeling of Stress :   Social Connections:   . Frequency of Communication with Friends and Family:   . Frequency of Social Gatherings with Friends and Family:   . Attends Religious Services:   . Active Member of Clubs or Organizations:   . Attends Archivist Meetings:   Marland Kitchen Marital Status:      Family History:  The patient's family history includes Cancer in her brother. There is no history of Coronary artery disease or Heart attack. She was adopted.  ROS:   Review of Systems  Constitutional: Positive for malaise/fatigue. Negative for chills, diaphoresis, fever and weight loss.  HENT: Negative for congestion.   Eyes: Negative for discharge and redness.  Respiratory: Negative for cough, sputum production, shortness of breath and wheezing.   Cardiovascular: Negative for chest pain, palpitations, orthopnea, claudication, leg swelling and PND.  Gastrointestinal: Negative for abdominal pain, blood in stool, heartburn, melena, nausea and vomiting.  Musculoskeletal: Positive for joint pain, myalgias and neck pain. Negative for falls.  Skin: Negative for rash.  Neurological: Positive for weakness. Negative for dizziness, tingling, tremors, sensory change, speech change, focal weakness and loss of consciousness.  Endo/Heme/Allergies: Does not bruise/bleed easily.  Psychiatric/Behavioral: Negative for substance abuse. The patient is not nervous/anxious.   All other systems reviewed and are negative.    EKGs/Labs/Other Studies Reviewed:    Studies reviewed were summarized above. The additional studies were reviewed today:  LHC 01/2015:  Dist RCA-1 lesion, 30% stenosed.  Dist RCA-2 lesion, 40% stenosed.  Prox Cx lesion, 30%  stenosed.  Prox LAD lesion, 80% stenosed. There is a 0% residual stenosis post intervention.  A drug-eluting stent was placed.  Ost 1st Diag lesion, 95% stenosed. There is a 40% residual stenosis post intervention.  Mid Cx lesion, 60% stenosed.  The left ventricular systolic function is normal.   1. Severe stenosis of the proximal LAD/first diagonal, treated successfully with stenting of  the LAD and balloon angioplasty of the first diagonal ostium through the struts of the stented segment 2. Moderate stenosis of the left circumflex, enteric with pressure wire analysis with a normal value of 0.94 at peak hyperemia 3. Wide patency of the stented segment within the right coronary artery and mild nonobstructive disease of the distal vessel 4. Preserved LV systolic function  Recommendations: Dual antiplatelet therapy with aspirin and brilinta for at least 12 months. Would consider long-term DAPT or monotherapy with brilinta in this patient with diabetes and multivessel coronary artery disease. __________  2D echo 09/05/2019: 1. Left ventricular ejection fraction, by estimation, is 60 to 65%. The  left ventricle has normal function. Left ventricular endocardial border  not optimally defined to evaluate regional wall motion. Left ventricular  diastolic parameters are indeterminate.  2. Right ventricular systolic function is normal. The right ventricular  size is normal. Mildly increased right ventricular wall thickness.  3. The mitral valve was not well visualized. Trivial mitral valve  regurgitation. No evidence of mitral stenosis.  4. The aortic valve was not well visualized. Aortic valve regurgitation  is not visualized. No aortic stenosis is present.  __________  Carlton Adam MPI 09/06/2019: Pharmacological myocardial perfusion imaging study with no significant  ischemia Normal wall motion, EF estimated at 39% (depressed ejection fraction likely secondary to GI uptake artifact) No EKG  changes concerning for ischemia at peak stress or in recovery Low risk scan   EKG:  EKG is ordered today.  The EKG ordered today demonstrates NSR, 78 bpm, LVH with early repolarization abnormality, prior inferior infarct and nonspecific st/t changes  Recent Labs: 09/04/2019: ALT 14 09/05/2019: BUN 10; Creatinine, Ser 0.72; Potassium 4.0; Sodium 138 09/06/2019: Hemoglobin 14.2; Platelets 224  Recent Lipid Panel    Component Value Date/Time   CHOL 158 09/05/2019 0549   TRIG 305 (H) 09/05/2019 0549   HDL 35 (L) 09/05/2019 0549   CHOLHDL 4.5 09/05/2019 0549   VLDL 61 (H) 09/05/2019 0549   LDLCALC 62 09/05/2019 0549   LDLDIRECT 75.0 05/24/2019 1201    PHYSICAL EXAM:    VS:  BP (!) 160/90 (BP Location: Left Arm, Patient Position: Sitting, Cuff Size: Normal)   Pulse 78   Ht 5\' 8"  (1.727 m)   Wt 187 lb (84.8 kg)   SpO2 93%   BMI 28.43 kg/m   BMI: Body mass index is 28.43 kg/m.  Physical Exam  Constitutional: She is oriented to person, place, and time. She appears well-developed and well-nourished.  HENT:  Head: Normocephalic and atraumatic.  Eyes: Right eye exhibits no discharge. Left eye exhibits no discharge.  Neck: No JVD present.  Cardiovascular: Normal rate, regular rhythm, S1 normal, S2 normal and normal heart sounds. Exam reveals no distant heart sounds, no friction rub, no midsystolic click and no opening snap.  No murmur heard. Pulses:      Posterior tibial pulses are 2+ on the right side and 2+ on the left side.  Pulmonary/Chest: Effort normal and breath sounds normal. No respiratory distress. She has no decreased breath sounds. She has no wheezes. She has no rales. She exhibits no tenderness.  Abdominal: Soft. She exhibits no distension. There is no abdominal tenderness.  Musculoskeletal:        General: No edema.     Cervical back: Normal range of motion.  Neurological: She is alert and oriented to person, place, and time.  Skin: Skin is warm and dry. No cyanosis.  Nails show no clubbing.  Psychiatric: She  has a normal mood and affect. Her speech is normal and behavior is normal. Judgment and thought content normal.    Wt Readings from Last 3 Encounters:  09/12/19 187 lb (84.8 kg)  09/06/19 182 lb 12.8 oz (82.9 kg)  08/24/19 187 lb 8 oz (85 kg)     ASSESSMENT & PLAN:   1. CAD involving the native coronary arteries with stable angina: She continues to note symptoms similar to what brought her to the ED earlier this month with bilateral arm pain concerning for potential anginal equivalent.  Given the continued symptoms we have agreed to proceed with diagnostic LHC.  She agrees to rescind DNI for this procedure.  At baseline, she is not a DNR.  Continue secondary prevention with aspirin, clopidogrel, carvedilol, atorvastatin and sublingual nitroglycerin.  Risks and benefits of cardiac catheterization have been discussed with the patient including risks of bleeding, bruising, infection, kidney damage, stroke, heart attack, injury to a limb, urgent need for bypass, and death. The patient understands these risks and is willing to proceed with the procedure. All questions have been answered and concerns listened to.  2. HTN: Blood pressure is suboptimally controlled at 160/90 at triage.  Recheck BP of 154/88.  Titrate carvedilol to 25 mg twice daily.  She will otherwise continue current dose losartan/HCTZ.   3. HLD: LDL of 62 from 08/2019.  Remains on atorvastatin.  4. Dilated ascending aorta: Incidentally noted on CT imaging during recent admission.  Follow-up imaging in 12 months.  Optimal BP and lipid control recommended.  Disposition: F/u with Dr. Saunders Revel in 2 weeks.   Medication Adjustments/Labs and Tests Ordered: Current medicines are reviewed at length with the patient today.  Concerns regarding medicines are outlined above. Medication changes, Labs and Tests ordered today are summarized above and listed in the Patient Instructions accessible in Encounters.    Signed, Christell Faith, PA-C 09/12/2019 2:01 PM     [facility] [address] [place] [address] [place], [place] [address] [phone]

## 2019-09-07 NOTE — Progress Notes (Signed)
°  Chronic Care Management   Outreach Note  09/07/2019 Name: Cynthia Singh MRN: [id number] DOB: [birth year]  Referred by: Venia Carbon, MD Reason for referral : No chief complaint on file.   An unsuccessful telephone outreach was attempted today. The patient was referred to the pharmacist for assistance with care management and care coordination.   Follow Up Plan:   [place]

## 2019-09-07 NOTE — Telephone Encounter (Signed)
1st attempt- Called patient to complete TCM and schedule hospital follow up. Patient never answered and voicemail box has not been setup. Unable to leave message.

## 2019-09-07 NOTE — H&P (View-Only) (Signed)
Cardiology Office Note    Date:  09/12/2019   ID:  Cynthia Singh, DOB [birth year], MRN [id number]  PCP:  Venia Carbon, MD  Cardiologist:  Jenkins Rouge, MD  Electrophysiologist:  None   Chief Complaint: Hospital follow up  History of Present Illness:   Cynthia Singh is a 79 y.o. female with history of CAD s/p RCA stenting in 2014 and PCI/DES to the LAD with POBA to the D1 in 01/2015, poorly controlled DM, bilateral SDH in the setting of a mechanical fall in 2018 while on DAPT, HTN, HLD, dilated ascending aorta, and OA who presents for hospital follow-up as detailed below.  She was admitted in 01/2015 with unstable angina. Diagnostic LHC at that time showed 80% proximal LAD stenosis treated with PCI/DES, 95% ostial D1 lesion treated with PTCA with 40% residual stenosis, 60% mid LCx lesion,patent RCA stent,EF 55-65%. She was seen in hospital follow up in 03/2015, though was lost to follow up thereafter until she was admitted to [facility] in 07/2016 in the setting of a mechanical fall, suffering bilateral SDH while on DAPT. Plavix was discontinued with recommendation to resume ASA when safe. She has been lost to follow up since until her recent admission to [facility] on 4/19 through 4/21 with chest, posterior neck, and thoracic back pain that began at rest while sitting on her sofa.  Initial BP was in the [id number] systolic.  Chest x-ray showed vascular congestion.  EKG showed sinus rhythm with LVH, poor R wave progression, and nonspecific ST-T changes.  CTA of the aortic showed no type A dissection with likely atherosclerotic disease with an ascending aorta measuring 3.7 cm.  High-sensitivity troponin peaked at 150.  She was placed on a heparin drip.  Initially, plans were for diagnostic LHC, however she declined this.  Echo showed an EF of 60 to 65%, normal RV systolic function and ventricular cavity size, trivial mitral regurgitation.  She underwent Lexiscan MPI on 09/06/2019 which showed no  significant ischemia and normal wall motion with an EF falsely reduced at 39% secondary to GI uptake artifact with EF noted to be normal by echo during the same admission.  Overall, this was a low risk scan.  She comes in today continuing to note bilateral arm and neck pain that are randomly occurring and will typically last for 20 to 30 minutes.  Pain is described as a deep ache.  It does not necessarily feel similar to her prior angina leading up to her previous interventions though she did have left arm pain with those episodes.  She denies any frank chest pain at this time.  Movement of her upper extremities does not reproduce her symptoms.  No associated dyspnea, palpitations, dizziness, presyncope, syncope.  No lower extremity swelling.  No falls.  At baseline, she is relatively sedentary.  She is not a formal DNR though does have a request for DNI.  She is agreeable to rescind this for the catheterization.  She is currently without symptoms.   Labs independently reviewed: 08/2019 - Hgb 14.2, PLT 224, potassium 4.0, BUN 10, serum creatinine 0.72, TC 158, TG 305, HDL 35, LDL 62, albumin 3.4, AST/ALT normal, A1c 11.8  Past Medical History:  Diagnosis Date   CAD (coronary artery disease)    a. Remote nonobstructive disease but in 10/2012 Cath/PCI: s/p DES to RCA. b. cath 02/08/15 DES to prox LAD and balloon angioplasty of ost D1, EF 55-65%.   Concussion    [facility] AFTER FALL 3/18  Depression    Diabetes mellitus, type 2 ([place])    Dyslipidemia    Episodic mood disorder ([place])    Hypertension    Myocardial infarction ([place])    2014   Obesity    Osteoarthritis of spine    knees also   Peripheral vascular disease, unspecified ([place]) 2020   severe disease on home screening by insurance    Past Surgical History:  Procedure Laterality Date   ABDOMINAL HYSTERECTOMY     [place] N/A 02/08/2015   Procedure: Left Heart Cath and Coronary Angiography;   Surgeon: Sherren Mocha, MD;  Location: [place] CV LAB;  Service: Cardiovascular;  Laterality: N/A;   CARDIAC CATHETERIZATION N/A 02/08/2015   Procedure: Coronary Stent Intervention;  Surgeon: Sherren Mocha, MD;  Location: [place] CV [facility];  Service: Cardiovascular;  Laterality: N/A;   CARDIAC CATHETERIZATION N/A 02/08/2015   Procedure: Intravascular Pressure Wire/FFR Study;  Surgeon: Sherren Mocha, MD;  Location: [place] [facility];  Service: Cardiovascular;  Laterality: N/A;   CATARACT EXTRACTION W/PHACO Left 11/10/2016   Procedure: CATARACT EXTRACTION PHACO AND INTRAOCULAR LENS PLACEMENT ([place]) suture placed in left eye at end of procedure;  Surgeon: Birder Robson, MD;  Location: [facility] ORS;  Service: Ophthalmology;  Laterality: Left;  [place]  3.20 AP% 27.7 CDE 55.63 Fluid pack lot # [id number]   CHOLECYSTECTOMY     COMBINED HYSTERECTOMY ABDOMINAL W/ A&P REPAIR / OOPHORECTOMY  1968   CORONARY ANGIOPLASTY     STENTS X 5   CORONARY STENT PLACEMENT  2014   DILATION AND CURETTAGE OF UTERUS     GALLBLADDER SURGERY  1968   LEFT HEART CATHETERIZATION WITH CORONARY ANGIOGRAM N/A 10/20/2012   Procedure: LEFT HEART CATHETERIZATION WITH CORONARY ANGIOGRAM;  Surgeon: Sherren Mocha, MD;  Location: [facility] CATH LAB;  Service: Cardiovascular;  Laterality: N/A;   multiple D&C     TONSILLECTOMY  age 89    Current Medications: Current Meds  Medication Sig   aspirin EC 81 MG tablet Take 81 mg by mouth daily.   atorvastatin (LIPITOR) 80 MG tablet TAKE 1/2 TABLETS (40 MG TOTAL) BY MOUTH DAILY.   carvedilol (COREG) 12.5 MG tablet Take 1 tablet (12.5 mg total) by mouth 2 (two) times daily with a meal.   clopidogrel (PLAVIX) 75 MG tablet TAKE 1 TABLET BY MOUTH EVERY DAY   Cranberry 500 MG CAPS Take 1,000 mg by mouth daily.   empagliflozin (JARDIANCE) 25 MG TABS tablet Take 25 mg by mouth daily before breakfast.   Insulin Glargine (LANTUS SOLOSTAR) 100 UNIT/ML Solostar Pen TAKE 36 UNITS DAILY  UNDER SKIN   Insulin Pen Needle (PEN NEEDLES) 32G X 5 MM MISC 1 Units by Does not apply route daily.   losartan-hydrochlorothiazide (HYZAAR) 100-12.5 MG tablet Take 1 tablet by mouth daily.   metFORMIN (GLUCOPHAGE-XR) 500 MG 24 hr tablet TAKE 2 TABLETS BY MOUTH EVERY DAY WITH BREAKFAST   nitroGLYCERIN (NITROSTAT) 0.4 MG SL tablet Place 1 tablet (0.4 mg total) under the tongue every 5 (five) minutes as needed for chest pain.   nortriptyline (PAMELOR) 50 MG capsule TAKE 3 CAPSULES (150 MG TOTAL) BY MOUTH AT BEDTIME.   Omega-3 Fatty Acids (FISH OIL PO) Take 1 capsule by mouth daily.   oxyCODONE-acetaminophen (PERCOCET) 10-325 MG tablet Take 1-2 tablets by mouth every 6 (six) hours as needed for pain. M54.9   polyethylene glycol (MIRALAX / GLYCOLAX) packet Take 17 g by mouth daily as needed.     Allergies:  Patient has no known allergies.   Social History   Socioeconomic History   Marital status: Widowed    Spouse name: Not on file   Number of children: 3   Years of education: 62   Highest education level: Not on file  Occupational History   Occupation: CASHIER  Tobacco Use   Smoking status: Never Smoker   Smokeless tobacco: Never Used  Substance and Sexual Activity   Alcohol use: No   Drug use: No   Sexual activity: Never    Birth control/protection: Post-menopausal  Other Topics Concern   Not on file  Social History Narrative   Widowed '13. 2 sons- '63, '66; one daughter-'69;    34 grandchildren--has raised 9 of her grandchildren 4 great grandchildren.   Lives with 2 granddaughters and adoptive son      Has living will   Probably would have son Poorvi Kapala as health care POA--not set up   Would accept resuscitation but no prolonged ventilation   Not sure about tube feeds   Social Determinants of Health   Financial Resource Strain:    Difficulty of Paying Living Expenses:   Food Insecurity:    Worried About Charity fundraiser in the Last Year:     Arboriculturist in the Last Year:   Transportation Needs:    Film/video editor (Medical):    Lack of Transportation (Non-Medical):   Physical Activity:    Days of Exercise per Week:    Minutes of Exercise per Session:   Stress:    Feeling of Stress :   Social Connections:    Frequency of Communication with Friends and Family:    Frequency of Social Gatherings with Friends and Family:    Attends Religious Services:    Active Member of Clubs or Organizations:    Attends Archivist Meetings:    Marital Status:      Family History:  The patient's family history includes Cancer in her brother. There is no history of Coronary artery disease or Heart attack. She was adopted.  ROS:   Review of Systems  Constitutional: Positive for malaise/fatigue. Negative for chills, diaphoresis, fever and weight loss.  HENT: Negative for congestion.   Eyes: Negative for discharge and redness.  Respiratory: Negative for cough, sputum production, shortness of breath and wheezing.   Cardiovascular: Negative for chest pain, palpitations, orthopnea, claudication, leg swelling and PND.  Gastrointestinal: Negative for abdominal pain, blood in stool, heartburn, melena, nausea and vomiting.  Musculoskeletal: Positive for joint pain, myalgias and neck pain. Negative for falls.  Skin: Negative for rash.  Neurological: Positive for weakness. Negative for dizziness, tingling, tremors, sensory change, speech change, focal weakness and loss of consciousness.  Endo/Heme/Allergies: Does not bruise/bleed easily.  Psychiatric/Behavioral: Negative for substance abuse. The patient is not nervous/anxious.   All other systems reviewed and are negative.    EKGs/Labs/Other Studies Reviewed:    Studies reviewed were summarized above. The additional studies were reviewed today:  LHC 01/2015:  Dist RCA-1 lesion, 30% stenosed.  Dist RCA-2 lesion, 40% stenosed.  Prox Cx lesion, 30%  stenosed.  Prox LAD lesion, 80% stenosed. There is a 0% residual stenosis post intervention.  A drug-eluting stent was placed.  Ost 1st Diag lesion, 95% stenosed. There is a 40% residual stenosis post intervention.  Mid Cx lesion, 60% stenosed.  The left ventricular systolic function is normal.   1. Severe stenosis of the proximal LAD/first diagonal, treated successfully with stenting of  the LAD and balloon angioplasty of the first diagonal ostium through the struts of the stented segment 2. Moderate stenosis of the left circumflex, enteric with pressure wire analysis with a normal value of 0.94 at peak hyperemia 3. Wide patency of the stented segment within the right coronary artery and mild nonobstructive disease of the distal vessel 4. Preserved LV systolic function  Recommendations: Dual antiplatelet therapy with aspirin and brilinta for at least 12 months. Would consider long-term DAPT or monotherapy with brilinta in this patient with diabetes and multivessel coronary artery disease. __________  2D echo 09/05/2019: 1. Left ventricular ejection fraction, by estimation, is 60 to 65%. The  left ventricle has normal function. Left ventricular endocardial border  not optimally defined to evaluate regional wall motion. Left ventricular  diastolic parameters are indeterminate.  2. Right ventricular systolic function is normal. The right ventricular  size is normal. Mildly increased right ventricular wall thickness.  3. The mitral valve was not well visualized. Trivial mitral valve  regurgitation. No evidence of mitral stenosis.  4. The aortic valve was not well visualized. Aortic valve regurgitation  is not visualized. No aortic stenosis is present.  __________  Carlton Adam MPI 09/06/2019: Pharmacological myocardial perfusion imaging study with no significant  ischemia Normal wall motion, EF estimated at 39% (depressed ejection fraction likely secondary to GI uptake artifact) No EKG  changes concerning for ischemia at peak stress or in recovery Low risk scan   EKG:  EKG is ordered today.  The EKG ordered today demonstrates NSR, 78 bpm, LVH with early repolarization abnormality, prior inferior infarct and nonspecific st/t changes  Recent Labs: 09/04/2019: ALT 14 09/05/2019: BUN 10; Creatinine, Ser 0.72; Potassium 4.0; Sodium 138 09/06/2019: Hemoglobin 14.2; Platelets 224  Recent Lipid Panel    Component Value Date/Time   CHOL 158 09/05/2019 0549   TRIG 305 (H) 09/05/2019 0549   HDL 35 (L) 09/05/2019 0549   CHOLHDL 4.5 09/05/2019 0549   VLDL 61 (H) 09/05/2019 0549   LDLCALC 62 09/05/2019 0549   LDLDIRECT 75.0 05/24/2019 1201    PHYSICAL EXAM:    VS:  BP (!) 160/90 (BP Location: Left Arm, Patient Position: Sitting, Cuff Size: Normal)    Pulse 78    Ht 5\' 8"  (1.727 m)    Wt 187 lb (84.8 kg)    SpO2 93%    BMI 28.43 kg/m   BMI: Body mass index is 28.43 kg/m.  Physical Exam  Constitutional: She is oriented to person, place, and time. She appears well-developed and well-nourished.  HENT:  Head: Normocephalic and atraumatic.  Eyes: Right eye exhibits no discharge. Left eye exhibits no discharge.  Neck: No JVD present.  Cardiovascular: Normal rate, regular rhythm, S1 normal, S2 normal and normal heart sounds. Exam reveals no distant heart sounds, no friction rub, no midsystolic click and no opening snap.  No murmur heard. Pulses:      Posterior tibial pulses are 2+ on the right side and 2+ on the left side.  Pulmonary/Chest: Effort normal and breath sounds normal. No respiratory distress. She has no decreased breath sounds. She has no wheezes. She has no rales. She exhibits no tenderness.  Abdominal: Soft. She exhibits no distension. There is no abdominal tenderness.  Musculoskeletal:        General: No edema.     Cervical back: Normal range of motion.  Neurological: She is alert and oriented to person, place, and time.  Skin: Skin is warm and dry. No cyanosis.  Nails show  no clubbing.  Psychiatric: She has a normal mood and affect. Her speech is normal and behavior is normal. Judgment and thought content normal.    Wt Readings from Last 3 Encounters:  09/12/19 187 lb (84.8 kg)  09/06/19 182 lb 12.8 oz (82.9 kg)  08/24/19 187 lb 8 oz (85 kg)     ASSESSMENT & PLAN:   1. CAD involving the native coronary arteries with stable angina: She continues to note symptoms similar to what brought her to the ED earlier this month with bilateral arm pain concerning for potential anginal equivalent.  Given the continued symptoms we have agreed to proceed with diagnostic LHC.  She agrees to rescind DNI for this procedure.  At baseline, she is not a DNR.  Continue secondary prevention with aspirin, clopidogrel, carvedilol, atorvastatin and sublingual nitroglycerin.  Risks and benefits of cardiac catheterization have been discussed with the patient including risks of bleeding, bruising, infection, kidney damage, stroke, heart attack, injury to a limb, urgent need for bypass, and death. The patient understands these risks and is willing to proceed with the procedure. All questions have been answered and concerns listened to.  2. HTN: Blood pressure is suboptimally controlled at 160/90 at triage.  Recheck BP of 154/88.  Titrate carvedilol to 25 mg twice daily.  She will otherwise continue current dose losartan/HCTZ.   3. HLD: LDL of 62 from 08/2019.  Remains on atorvastatin.  4. Dilated ascending aorta: Incidentally noted on CT imaging during recent admission.  Follow-up imaging in 12 months.  Optimal BP and lipid control recommended.  Disposition: F/u with Dr. Saunders Revel in 2 weeks.   Medication Adjustments/Labs and Tests Ordered: Current medicines are reviewed at length with the patient today.  Concerns regarding medicines are outlined above. Medication changes, Labs and Tests ordered today are summarized above and listed in the Patient Instructions accessible in Encounters.    Signed, Christell Faith, PA-C 09/12/2019 2:01 PM     [facility] [address] [place] [address] [place], [place] [address] [phone]

## 2019-09-07 NOTE — Telephone Encounter (Signed)
noted 

## 2019-09-07 NOTE — Telephone Encounter (Signed)
Patient contacted regarding discharge from [facility] on 4/21.  Patient understands to follow up with provider Christell Faith, PA on 4/27 at 2 pm at [facility]. Patient understands discharge instructions? yes Patient understands medications and regiment? yes Patient understands to bring all medications to this visit? yes

## 2019-09-08 ENCOUNTER — Other Ambulatory Visit: Payer: Self-pay | Admitting: Internal Medicine

## 2019-09-08 MED ORDER — OXYCODONE-ACETAMINOPHEN 10-325 MG PO TABS: 1.0000 | ORAL_TABLET | Freq: Four times a day (QID) | ORAL | 0 refills | Status: DC | PRN

## 2019-09-08 NOTE — Telephone Encounter (Signed)
I did the prescription but she needs to schedule a hospital follow up visit

## 2019-09-08 NOTE — Telephone Encounter (Signed)
Spoke to pt. Made appt 09-13-19.

## 2019-09-08 NOTE — Telephone Encounter (Signed)
patient called requesting a refill   oxyCODONE-acetaminophen (PERCOCET) 10-325 MG tablet   Patient stated she is due for a refill tomorrow, did not state how many she has left   [facility] [place]

## 2019-09-08 NOTE — Telephone Encounter (Signed)
Name of Medication: oxycodone apap 10-325 mg Name of [facility]acy: [facility] or Written Date and Quantity:# 150 on 08/09/19  Last Office Visit and Type:08/24/19 for 3 mth FU  Next Office Visit and Type:11/23/19 for 3 mth FU  Last Controlled Substance Agreement Date: 08/29/2019 Last UDS:08/24/2019   Request refilled 09/08/19.

## 2019-09-11 ENCOUNTER — Telehealth: Payer: Self-pay | Admitting: Internal Medicine

## 2019-09-11 NOTE — Chronic Care Management (AMB) (Signed)
°  Chronic Care Management   Note  09/11/2019 Name: Cynthia Singh MRN: [id number] DOB: [birth year]  [name] is a 79 y.o. year old female who is a primary care patient of Venia Carbon, MD. I reached out to Burley Saver by phone today in response to a referral sent by Ms. Veneda Melter PCP, Venia Carbon, MD.   [name] was given information about Chronic Care Management services today including:  1. CCM service includes personalized support from designated clinical staff supervised by her physician, including individualized plan of care and coordination with other care providers 2. 24/7 contact phone numbers for assistance for urgent and routine care needs. 3. Service will only be billed when office clinical staff spend 20 minutes or more in a month to coordinate care. 4. Only one practitioner may furnish and bill the service in a calendar month. 5. The patient may stop CCM services at any time (effective at the end of the month) by phone call to the office staff.   Patient agreed to services and verbal consent obtained.    This note is not being shared with the patient for the following reason: To respect privacy (The patient or proxy has requested that the information not be shared).  Follow up plan:   [name] [employer] Scheduler

## 2019-09-12 ENCOUNTER — Other Ambulatory Visit: Payer: Self-pay

## 2019-09-12 ENCOUNTER — Encounter: Payer: Self-pay | Admitting: Physician Assistant

## 2019-09-12 ENCOUNTER — Ambulatory Visit (INDEPENDENT_AMBULATORY_CARE_PROVIDER_SITE_OTHER): Payer: Medicare Other | Admitting: Physician Assistant

## 2019-09-12 VITALS — BP 160/90 | HR 78 | Ht 68.0 in | Wt 187.0 lb

## 2019-09-12 DIAGNOSIS — I25118 Atherosclerotic heart disease of native coronary artery with other forms of angina pectoris: Secondary | ICD-10-CM

## 2019-09-12 DIAGNOSIS — E785 Hyperlipidemia, unspecified: Secondary | ICD-10-CM

## 2019-09-12 DIAGNOSIS — I10 Essential (primary) hypertension: Secondary | ICD-10-CM

## 2019-09-12 DIAGNOSIS — I77810 Thoracic aortic ectasia: Secondary | ICD-10-CM

## 2019-09-12 MED ORDER — CARVEDILOL 25 MG PO TABS: 25.0000 mg | ORAL_TABLET | Freq: Two times a day (BID) | ORAL | 6 refills | Status: DC

## 2019-09-12 NOTE — Patient Instructions (Addendum)
Medication Instructions:  1- INCREASE Coreg to 25 mg total twice daily. *If you need a refill on your cardiac medications before your next appointment, please call your pharmacy*   Lab Work: None ordered  If you have labs (blood work) drawn today and your tests are completely normal, you will receive your results only by: Marland Kitchen MyChart Message (if you have MyChart) OR . A paper copy in the mail If you have any lab test that is abnormal or we need to change your treatment, we will call you to review the results.   Testing/Procedures: 1-    [facility] [place], [place] [address] Dept: [phone] Loc: [place]  09/12/2019  You are scheduled for a Cardiac Catheterization on Tuesday, May 4 with Dr. Kathlyn Sacramento.  1. Please arrive at the medical mall of [facility] at 9:30 AM (This time is one hour before your procedure to ensure your preparation). Free valet parking service is available.   Special note: Every effort is made to have your procedure done on time. Please understand that emergencies sometimes delay scheduled procedures.  2. Diet: Do not eat solid foods after midnight.  The patient may have clear liquids until 5am upon the day of the procedure.  3. Labs: CV19 Pre admit testing DRIVE THRU  Please report to the PAT testing site (medical arts building) on ______fri apr 30___ date ______8 am - 12_____ time for your DRIVE THRU covid testing that is required prior to your procedure.  Following covid testing, please remain in quarantine. If you must be around others, please wash hands, avoid touching face and wear your mask.   4. Medication instructions in preparation for your procedure:  Hold Metformin 24 hours prior to procedure. Take only 1/2 dose Lantus the evening prior.  On the morning of your procedure, take your Aspirin and Plavix/Clopidogrel and any  morning medicines NOT listed above.  You may use sips of water.  5. Plan for one night stay--bring personal belongings. 6. Bring a current list of your medications and current insurance cards. 7. You MUST have a responsible person to drive you home. 8. Someone MUST be with you the first 24 hours after you arrive home or your discharge will be delayed. 9. Please wear clothes that are easy to get on and off and wear slip-on shoes.  Thank you for allowing [place] to care for you!   -- [facility]    Follow-Up: At [facility], you and your health needs are our priority.  As part of our continuing mission to provide you with exceptional heart care, we have created designated Provider Care Teams.  These Care Teams include your primary Cardiologist (physician) and Advanced Practice Providers (APPs -  Physician Assistants and Nurse Practitioners) who all work together to provide you with the care you need, when you need it.  We recommend signing up for the patient portal called "MyChart".  Sign up information is provided on this After Visit Summary.  MyChart is used to connect with patients for Virtual Visits (Telemedicine).  Patients are able to view lab/test results, encounter notes, upcoming appointments, etc.  Non-urgent messages can be sent to your provider as well.   To learn more about what you can do with MyChart, go to [email or web address].    Your next appointment:   2 week(s)  The format for your next appointment:   In Person  Provider:    You may see Dr. Saunders Revel or Christell Faith, PA-C.

## 2019-09-13 ENCOUNTER — Telehealth: Payer: Self-pay

## 2019-09-13 ENCOUNTER — Ambulatory Visit: Payer: Medicare Other | Admitting: Internal Medicine

## 2019-09-13 NOTE — Telephone Encounter (Signed)
[facility] Night - Client Nonclinical Telephone Record AccessNurse Client [place] Night - Client Client Site [place] Physician [name] - MD Contact Type Call Who Is Calling Patient / Member / Family / Caregiver Caller Name [place] Phone Number na - Didn't remember her phone. She said that you had it. Patient Name Cynthia Singh Patient DOB 1940/05/20 Call Type Message Only Information Provided Reason for Call Request to [facility] Appointment Initial Comment Caller states she needs to cancel her 9:00am appt for today. Additional Comment Disp. Time Disposition Final User 09/13/2019 7:59:09 AM General Information Provided Yes Artis Flock Call Closed By: Artis Flock Transaction Date/Time: 09/13/2019 7:57:12 AM (ET)

## 2019-09-13 NOTE — Telephone Encounter (Signed)
I cancelled appointment and patient scheduled follow up appointment on 09/28/19.

## 2019-09-13 NOTE — Telephone Encounter (Signed)
Okay to cancel appointment Can reschedule after her planned procedure

## 2019-09-13 NOTE — Telephone Encounter (Signed)
I called patient.  She's not feeling well and she wasn't able to sleep last night.  Patient said she didn't have anyone to bring her and she didn't think it was safe for her to drive here.  Patient said she's having a catheterization done next week and wanted to know if Dr.Letvak wants to see her before or after the procedure?  Can patient's appointment be cancelled?

## 2019-09-15 ENCOUNTER — Other Ambulatory Visit
Admission: RE | Admit: 2019-09-15 | Discharge: 2019-09-15 | Disposition: A | Discharge status: Home / Self Care | Payer: Medicare Other | Source: Ambulatory Visit | Attending: Internal Medicine | Admitting: Internal Medicine

## 2019-09-15 ENCOUNTER — Other Ambulatory Visit: Payer: Self-pay

## 2019-09-15 DIAGNOSIS — Z01812 Encounter for preprocedural laboratory examination: Secondary | ICD-10-CM | POA: Diagnosis not present

## 2019-09-15 DIAGNOSIS — Z20822 Contact with and (suspected) exposure to covid-19: Secondary | ICD-10-CM | POA: Diagnosis not present

## 2019-09-15 LAB — SARS CORONAVIRUS 2 (TAT 6-24 HRS): SARS Coronavirus 2: NEGATIVE

## 2019-09-18 ENCOUNTER — Telehealth: Payer: Self-pay | Admitting: Internal Medicine

## 2019-09-18 NOTE — Telephone Encounter (Signed)
Patient calling Has a cath scheduled for tomorrow morning 09/19/19 Patient took metformin medication this morning before realizing she should not be taking medication  Please call to discuss ASAP - patient not sure if she will be able to continue procedure

## 2019-09-18 NOTE — Telephone Encounter (Signed)
Spoke with the patient. Patient forgot to HOLD her Metformin in anticipation of her 09/19/19 cardiac cath with Dr. Saunders Revel.  Discussed with Christell Faith, PA. Ok to proceed with the cath as scheduled. Patient advised not take her Metformin tomorrow, to be resumed 48 hours after the procedure. She is to take 1/2 of her insulin dose this evening. Patient verbalized understanding and voiced appreciation for the call back.

## 2019-09-19 ENCOUNTER — Ambulatory Visit
Admission: RE | Admit: 2019-09-19 | Discharge: 2019-09-20 | Disposition: A | Discharge status: Home / Self Care | Payer: Medicare Other | Attending: Internal Medicine | Admitting: Internal Medicine

## 2019-09-19 ENCOUNTER — Other Ambulatory Visit: Payer: Self-pay

## 2019-09-19 ENCOUNTER — Encounter
Admission: RE | Disposition: A | Discharge status: Home / Self Care | Payer: Self-pay | Source: Home / Self Care | Attending: Internal Medicine

## 2019-09-19 ENCOUNTER — Encounter: Payer: Self-pay | Admitting: Internal Medicine

## 2019-09-19 DIAGNOSIS — I10 Essential (primary) hypertension: Secondary | ICD-10-CM | POA: Insufficient documentation

## 2019-09-19 DIAGNOSIS — Z7982 Long term (current) use of aspirin: Secondary | ICD-10-CM | POA: Diagnosis not present

## 2019-09-19 DIAGNOSIS — Z955 Presence of coronary angioplasty implant and graft: Secondary | ICD-10-CM | POA: Diagnosis not present

## 2019-09-19 DIAGNOSIS — R079 Chest pain, unspecified: Secondary | ICD-10-CM

## 2019-09-19 DIAGNOSIS — Z794 Long term (current) use of insulin: Secondary | ICD-10-CM | POA: Insufficient documentation

## 2019-09-19 DIAGNOSIS — I25110 Atherosclerotic heart disease of native coronary artery with unstable angina pectoris: Secondary | ICD-10-CM | POA: Insufficient documentation

## 2019-09-19 DIAGNOSIS — Z79899 Other long term (current) drug therapy: Secondary | ICD-10-CM | POA: Diagnosis not present

## 2019-09-19 DIAGNOSIS — I200 Unstable angina: Secondary | ICD-10-CM | POA: Diagnosis present

## 2019-09-19 DIAGNOSIS — E1151 Type 2 diabetes mellitus with diabetic peripheral angiopathy without gangrene: Secondary | ICD-10-CM | POA: Diagnosis not present

## 2019-09-19 DIAGNOSIS — F329 Major depressive disorder, single episode, unspecified: Secondary | ICD-10-CM | POA: Insufficient documentation

## 2019-09-19 DIAGNOSIS — Z7902 Long term (current) use of antithrombotics/antiplatelets: Secondary | ICD-10-CM | POA: Insufficient documentation

## 2019-09-19 DIAGNOSIS — Z9861 Coronary angioplasty status: Secondary | ICD-10-CM | POA: Diagnosis not present

## 2019-09-19 DIAGNOSIS — I214 Non-ST elevation (NSTEMI) myocardial infarction: Secondary | ICD-10-CM | POA: Insufficient documentation

## 2019-09-19 DIAGNOSIS — E669 Obesity, unspecified: Secondary | ICD-10-CM | POA: Diagnosis not present

## 2019-09-19 DIAGNOSIS — E785 Hyperlipidemia, unspecified: Secondary | ICD-10-CM | POA: Diagnosis not present

## 2019-09-19 DIAGNOSIS — I2510 Atherosclerotic heart disease of native coronary artery without angina pectoris: Secondary | ICD-10-CM | POA: Diagnosis not present

## 2019-09-19 DIAGNOSIS — I25119 Atherosclerotic heart disease of native coronary artery with unspecified angina pectoris: Secondary | ICD-10-CM

## 2019-09-19 DIAGNOSIS — Z6828 Body mass index (BMI) 28.0-28.9, adult: Secondary | ICD-10-CM | POA: Diagnosis not present

## 2019-09-19 DIAGNOSIS — E1159 Type 2 diabetes mellitus with other circulatory complications: Secondary | ICD-10-CM | POA: Diagnosis not present

## 2019-09-19 DIAGNOSIS — E1165 Type 2 diabetes mellitus with hyperglycemia: Secondary | ICD-10-CM | POA: Diagnosis present

## 2019-09-19 HISTORY — PX: CORONARY STENT INTERVENTION: CATH118234

## 2019-09-19 HISTORY — PX: LEFT HEART CATH AND CORONARY ANGIOGRAPHY: CATH118249

## 2019-09-19 LAB — GLUCOSE, CAPILLARY
Glucose-Capillary: 273 mg/dL — ABNORMAL HIGH (ref 70–99)
Glucose-Capillary: 262 mg/dL — ABNORMAL HIGH (ref 70–99)
Glucose-Capillary: 244 mg/dL — ABNORMAL HIGH (ref 70–99)

## 2019-09-19 LAB — POCT ACTIVATED CLOTTING TIME
Activated Clotting Time: 285 seconds
Activated Clotting Time: 290 seconds

## 2019-09-19 SURGERY — LEFT HEART CATH AND CORONARY ANGIOGRAPHY
Anesthesia: Moderate Sedation

## 2019-09-19 MED ORDER — LABETALOL HCL 5 MG/ML IV SOLN: 10.0000 mg | INTRAVENOUS | Status: AC | PRN

## 2019-09-19 MED ORDER — SODIUM CHLORIDE 0.9 % IV SOLN: 250.0000 mL | INTRAVENOUS | Status: DC | PRN

## 2019-09-19 MED ORDER — ACETAMINOPHEN 325 MG PO TABS: 650.0000 mg | ORAL_TABLET | ORAL | Status: DC | PRN

## 2019-09-19 MED ORDER — HEPARIN SODIUM (PORCINE) 1000 UNIT/ML IJ SOLN
INTRAMUSCULAR | Status: AC
  Filled 2019-09-19: qty 1

## 2019-09-19 MED ORDER — NITROGLYCERIN 1 MG/10 ML FOR IR/CATH LAB
INTRA_ARTERIAL | Status: DC | PRN
  Administered 2019-09-19 (×3): 200 ug via INTRACORONARY

## 2019-09-19 MED ORDER — INSULIN ASPART 100 UNIT/ML ~~LOC~~ SOLN
0.0000 [IU] | Freq: Three times a day (TID) | SUBCUTANEOUS | Status: DC
  Administered 2019-09-20: 5 [IU] via SUBCUTANEOUS
  Filled 2019-09-19: qty 1

## 2019-09-19 MED ORDER — OXYCODONE-ACETAMINOPHEN 5-325 MG PO TABS
1.0000 | ORAL_TABLET | Freq: Four times a day (QID) | ORAL | Status: DC | PRN
  Administered 2019-09-19 – 2019-09-20 (×2): 1 via ORAL
  Filled 2019-09-19: qty 1

## 2019-09-19 MED ORDER — SODIUM CHLORIDE 0.9% FLUSH: 3.0000 mL | INTRAVENOUS | Status: DC | PRN

## 2019-09-19 MED ORDER — IOHEXOL 300 MG/ML  SOLN
INTRAMUSCULAR | Status: DC | PRN
  Administered 2019-09-19: 145 mL

## 2019-09-19 MED ORDER — VERAPAMIL HCL 2.5 MG/ML IV SOLN
INTRAVENOUS | Status: DC | PRN
  Administered 2019-09-19: 2.5 mg via INTRA_ARTERIAL

## 2019-09-19 MED ORDER — LOSARTAN POTASSIUM 50 MG PO TABS
100.0000 mg | ORAL_TABLET | Freq: Every day | ORAL | Status: DC
  Administered 2019-09-19 – 2019-09-20 (×2): 100 mg via ORAL
  Filled 2019-09-19 (×2): qty 2

## 2019-09-19 MED ORDER — CLOPIDOGREL BISULFATE 75 MG PO TABS
75.0000 mg | ORAL_TABLET | Freq: Every day | ORAL | Status: DC
  Administered 2019-09-20: 75 mg via ORAL
  Filled 2019-09-19: qty 1

## 2019-09-19 MED ORDER — HEPARIN (PORCINE) IN NACL 1000-0.9 UT/500ML-% IV SOLN
INTRAVENOUS | Status: AC
  Filled 2019-09-19: qty 1000

## 2019-09-19 MED ORDER — SODIUM CHLORIDE 0.9% FLUSH: 3.0000 mL | Freq: Two times a day (BID) | INTRAVENOUS | Status: DC

## 2019-09-19 MED ORDER — HEPARIN (PORCINE) IN NACL 1000-0.9 UT/500ML-% IV SOLN
INTRAVENOUS | Status: DC | PRN
  Administered 2019-09-19: 1000 mL

## 2019-09-19 MED ORDER — CLOPIDOGREL BISULFATE 75 MG PO TABS
ORAL_TABLET | ORAL | Status: DC | PRN
  Administered 2019-09-19: 300 mg via ORAL

## 2019-09-19 MED ORDER — CLOPIDOGREL BISULFATE 75 MG PO TABS
ORAL_TABLET | ORAL | Status: AC
  Filled 2019-09-19: qty 4

## 2019-09-19 MED ORDER — POLYETHYLENE GLYCOL 3350 17 G PO PACK: 17.0000 g | PACK | Freq: Every day | ORAL | Status: DC | PRN

## 2019-09-19 MED ORDER — SODIUM CHLORIDE 0.9 % WEIGHT BASED INFUSION: 3.0000 mL/kg/h | INTRAVENOUS | Status: DC

## 2019-09-19 MED ORDER — OXYCODONE-ACETAMINOPHEN 5-325 MG PO TABS
ORAL_TABLET | ORAL | Status: AC
  Filled 2019-09-19: qty 1

## 2019-09-19 MED ORDER — ONDANSETRON HCL 4 MG/2ML IJ SOLN: 4.0000 mg | Freq: Four times a day (QID) | INTRAMUSCULAR | Status: DC | PRN

## 2019-09-19 MED ORDER — HYDRALAZINE HCL 20 MG/ML IJ SOLN
INTRAMUSCULAR | Status: AC
  Filled 2019-09-19: qty 1

## 2019-09-19 MED ORDER — LOSARTAN POTASSIUM-HCTZ 100-12.5 MG PO TABS: 1.0000 | ORAL_TABLET | Freq: Every day | ORAL | Status: DC

## 2019-09-19 MED ORDER — HYDRALAZINE HCL 20 MG/ML IJ SOLN
INTRAMUSCULAR | Status: DC | PRN
  Administered 2019-09-19: 10 mg via INTRAVENOUS

## 2019-09-19 MED ORDER — OXYCODONE HCL 5 MG PO TABS
5.0000 mg | ORAL_TABLET | Freq: Four times a day (QID) | ORAL | Status: DC | PRN
  Administered 2019-09-19: 5 mg via ORAL

## 2019-09-19 MED ORDER — OXYCODONE-ACETAMINOPHEN 10-325 MG PO TABS: 1.0000 | ORAL_TABLET | Freq: Four times a day (QID) | ORAL | Status: DC | PRN

## 2019-09-19 MED ORDER — ASPIRIN 81 MG PO CHEW: 81.0000 mg | CHEWABLE_TABLET | ORAL | Status: DC

## 2019-09-19 MED ORDER — VERAPAMIL HCL 2.5 MG/ML IV SOLN
INTRAVENOUS | Status: AC
  Filled 2019-09-19: qty 2

## 2019-09-19 MED ORDER — HYDRALAZINE HCL 20 MG/ML IJ SOLN: 10.0000 mg | INTRAMUSCULAR | Status: AC | PRN

## 2019-09-19 MED ORDER — OXYCODONE HCL 5 MG PO TABS
ORAL_TABLET | ORAL | Status: AC
  Filled 2019-09-19: qty 1

## 2019-09-19 MED ORDER — SODIUM CHLORIDE 0.9% FLUSH
3.0000 mL | Freq: Two times a day (BID) | INTRAVENOUS | Status: DC
  Administered 2019-09-19: 3 mL via INTRAVENOUS

## 2019-09-19 MED ORDER — MIDAZOLAM HCL 2 MG/2ML IJ SOLN
INTRAMUSCULAR | Status: AC
  Filled 2019-09-19: qty 2

## 2019-09-19 MED ORDER — ATORVASTATIN CALCIUM 20 MG PO TABS
40.0000 mg | ORAL_TABLET | Freq: Every day | ORAL | Status: DC
  Administered 2019-09-20: 40 mg via ORAL
  Filled 2019-09-19: qty 2

## 2019-09-19 MED ORDER — FENTANYL CITRATE (PF) 100 MCG/2ML IJ SOLN
INTRAMUSCULAR | Status: DC | PRN
  Administered 2019-09-19 (×2): 25 ug via INTRAVENOUS
  Administered 2019-09-19: 50 ug via INTRAVENOUS

## 2019-09-19 MED ORDER — INSULIN GLARGINE 100 UNIT/ML ~~LOC~~ SOLN
36.0000 [IU] | Freq: Every day | SUBCUTANEOUS | Status: DC
  Administered 2019-09-19: 36 [IU] via SUBCUTANEOUS
  Filled 2019-09-19 (×2): qty 0.36

## 2019-09-19 MED ORDER — HYDROCHLOROTHIAZIDE 12.5 MG PO CAPS
12.5000 mg | ORAL_CAPSULE | Freq: Every day | ORAL | Status: DC
  Administered 2019-09-19 – 2019-09-20 (×2): 12.5 mg via ORAL
  Filled 2019-09-19 (×2): qty 1

## 2019-09-19 MED ORDER — NITROGLYCERIN 1 MG/10 ML FOR IR/CATH LAB
INTRA_ARTERIAL | Status: AC
  Filled 2019-09-19: qty 10

## 2019-09-19 MED ORDER — HEPARIN SODIUM (PORCINE) 1000 UNIT/ML IJ SOLN
INTRAMUSCULAR | Status: DC | PRN
  Administered 2019-09-19: 2000 [IU] via INTRAVENOUS
  Administered 2019-09-19: 4500 [IU] via INTRAVENOUS
  Administered 2019-09-19: 5500 [IU] via INTRAVENOUS

## 2019-09-19 MED ORDER — NORTRIPTYLINE HCL 25 MG PO CAPS
150.0000 mg | ORAL_CAPSULE | Freq: Every day | ORAL | Status: DC
  Administered 2019-09-19: 150 mg via ORAL
  Filled 2019-09-19 (×2): qty 6

## 2019-09-19 MED ORDER — ENOXAPARIN SODIUM 40 MG/0.4ML ~~LOC~~ SOLN
40.0000 mg | SUBCUTANEOUS | Status: DC
  Administered 2019-09-20: 40 mg via SUBCUTANEOUS
  Filled 2019-09-19: qty 0.4

## 2019-09-19 MED ORDER — NITROGLYCERIN 0.4 MG SL SUBL: 0.4000 mg | SUBLINGUAL_TABLET | SUBLINGUAL | Status: DC | PRN

## 2019-09-19 MED ORDER — SODIUM CHLORIDE 0.9 % IV SOLN: INTRAVENOUS | Status: AC

## 2019-09-19 MED ORDER — ASPIRIN EC 81 MG PO TBEC
81.0000 mg | DELAYED_RELEASE_TABLET | Freq: Every day | ORAL | Status: DC
  Administered 2019-09-20: 81 mg via ORAL
  Filled 2019-09-19: qty 1

## 2019-09-19 MED ORDER — MIDAZOLAM HCL 2 MG/2ML IJ SOLN
INTRAMUSCULAR | Status: DC | PRN
  Administered 2019-09-19 (×3): 1 mg via INTRAVENOUS

## 2019-09-19 MED ORDER — CARVEDILOL 25 MG PO TABS
25.0000 mg | ORAL_TABLET | Freq: Two times a day (BID) | ORAL | Status: DC
  Administered 2019-09-19 – 2019-09-20 (×2): 25 mg via ORAL
  Filled 2019-09-19 (×2): qty 1

## 2019-09-19 MED ORDER — SODIUM CHLORIDE 0.9 % WEIGHT BASED INFUSION
1.0000 mL/kg/h | INTRAVENOUS | Status: DC
  Administered 2019-09-19: 1 mL/kg/h via INTRAVENOUS

## 2019-09-19 MED ORDER — FENTANYL CITRATE (PF) 100 MCG/2ML IJ SOLN
INTRAMUSCULAR | Status: AC
  Filled 2019-09-19: qty 2

## 2019-09-19 SURGICAL SUPPLY — 19 items
BALLN TREK RX 2.25X15 (BALLOONS) ×3
BALLN ~~LOC~~ EUPHORA RX 3.25X15 (BALLOONS) ×3
BALLOON TREK RX 2.25X15 (BALLOONS) ×2 IMPLANT
BALLOON ~~LOC~~ EUPHORA RX 3.25X15 (BALLOONS) ×2 IMPLANT
CATH GUIDE ADROIT 6FR AL.75 (CATHETERS) ×3 IMPLANT
CATH INFINITI 5 FR JL3.5 (CATHETERS) ×3 IMPLANT
CATH INFINITI JR4 5F (CATHETERS) ×3 IMPLANT
CATH VISTA GUIDE 6FR 3DRC (CATHETERS) ×3 IMPLANT
DEVICE INFLAT 30 PLUS (MISCELLANEOUS) ×3 IMPLANT
DEVICE RAD TR BAND REGULAR (VASCULAR PRODUCTS) ×3 IMPLANT
GLIDESHEATH SLEND SS 6F .021 (SHEATH) ×3 IMPLANT
GUIDEWIRE INQWIRE 1.5J.035X260 (WIRE) ×2 IMPLANT
INQWIRE 1.5J .035X260CM (WIRE) ×3
KIT MANI 3VAL PERCEP (MISCELLANEOUS) ×3 IMPLANT
PACK CARDIAC CATH (CUSTOM PROCEDURE TRAY) ×3 IMPLANT
STENT SYNERGY DES 3X16 (Permanent Stent) ×3 IMPLANT
STENT SYNERGY DES 3X32 (Permanent Stent) ×3 IMPLANT
WIRE HITORQ VERSACORE ST 145CM (WIRE) ×3 IMPLANT
WIRE RUNTHROUGH .014X180CM (WIRE) ×3 IMPLANT

## 2019-09-19 NOTE — Plan of Care (Signed)
  Problem: Education: Goal: Knowledge of General Education information will improve Description: Including pain rating scale, medication(s)/side effects and non-pharmacologic comfort measures Outcome: Not Progressing   Problem: Health Behavior/Discharge Planning: Goal: Ability to manage health-related needs will improve Outcome: Not Progressing   Problem: Clinical Measurements: Goal: Ability to maintain clinical measurements within normal limits will improve Outcome: Not Progressing Goal: Will remain free from infection Outcome: Not Progressing Goal: Diagnostic test results will improve Outcome: Not Progressing Goal: Respiratory complications will improve Outcome: Not Progressing Goal: Cardiovascular complication will be avoided Outcome: Not Progressing   Problem: Pain Managment: Goal: General experience of comfort will improve Outcome: Not Progressing   Problem: Safety: Goal: Ability to remain free from injury will improve Outcome: Not Progressing   Problem: Cardiovascular: Goal: Ability to achieve and maintain adequate cardiovascular perfusion will improve Outcome: Not Progressing Goal: Vascular access site(s) Level 0-1 will be maintained Outcome: Not Progressing

## 2019-09-19 NOTE — Discharge Instructions (Signed)
Please review your medication list carefully as there may have been some changes during your hospital stay. If you have any questions or difficulty getting your medications, please contact our office at [phone].    Coronary Angioplasty, Care After This sheet gives you information about how to care for yourself after your procedure. Your health care provider may also give you more specific instructions. If you have problems or questions, contact your health care provider. What can I expect after the procedure? After your procedure, it is common to have:  Bruising at the catheter insertion site. This usually fades within 1-2 weeks.  Blood collecting in the tissue (hematoma) that may be painful to the touch. It should become smaller and less tender within 1-2 weeks. Follow these instructions at home: Medicines  Take over-the-counter and prescription medicines only as told by your health care provider.  Blood thinners may be prescribed after your procedure to improve blood flow. Bathing  You may shower 24-48 hours after the procedure or as told by your health care provider.  Do not take baths, swim, or use a hot tub until your health care provider approves. Insertion site care   Follow instructions from your health care provider about how to take care of your insertion site. Make sure you: ? Wash your hands with soap and water before you change your bandage (dressing). If soap and water are not available, use hand sanitizer. ? Change your dressing as told by your health care provider. ? Gently wash the site with plain soap and water. ? Use a clean towel to pat the area dry. ? Do not rub the site, because this may cause bleeding. ? Do not apply powder or lotion to the site.  Check your insertion site every day for signs of infection. Check for: ? More redness, swelling, or pain. ? More fluid or blood. ? Warmth. ? Pus or a bad smell. Lifestyle   Make any lifestyle  changes as recommended by your health care provider. This may include: ? Not using any products that contain nicotine or tobacco, such as cigarettes and e-cigarettes. If you need help quitting, ask your health care provider. ? Managing your weight. ? Getting regular exercise. ? Managing your blood pressure. ? Limiting your alcohol intake. ? Managing other health problems, such as diabetes.  Eat a heart-healthy diet. This should include plenty of fresh fruits and vegetables. Avoid foods that are: ? High in salt (sodium). ? Canned or highly processed. ? High in saturated fat or sugar. ? Fried. General instructions  Do not lift over 10 lb (4.5 kg) for 5 days after your procedure or as told by your health care provider.  Ask your health care provider when it is okay to: ? Return to work or school. ? Resume usual physical activities or sports. ? Resume sexual activity.  Keep all follow-up visits as told by your health care provider. This is important. Contact a health care provider if:  You have a fever.  You have chills.  You have increased bleeding from the insertion site. Hold pressure on the site. Get help right away if:  You develop chest pain or shortness of breath, feel faint, or pass out.  You have unusual pain at the insertion site.  You have redness, warmth, or swelling at the insertion site.  You have drainage (other than a small amount of blood on the dressing) from the insertion site.  The insertion site is bleeding, and the bleeding  does not stop after 30 minutes of holding steady pressure on the site.  You develop bleeding from any other place, such as from the rectum. There may be bright red blood in your urine or stool, or it may appear as black, tarry stool. This information is not intended to replace advice given to you by your health care provider. Make sure you discuss any questions you have with your health care provider. Document Revised: 04/16/2017  Document Reviewed: 12/08/2015 Elsevier Patient Education  2020 [employer] [place]. __________   Radial Site Care  This sheet gives you information about how to care for yourself after your procedure. Your health care provider may also give you more specific instructions. If you have problems or questions, contact your health care provider. What can I expect after the procedure? After the procedure, it is common to have:  Bruising and tenderness at the catheter insertion area. Follow these instructions at home: Medicines  Take over-the-counter and prescription medicines only as told by your health care provider. Insertion site care  Follow instructions from your health care provider about how to take care of your insertion site. Make sure you: ? Wash your hands with soap and water before you change your bandage (dressing). If soap and water are not available, use hand sanitizer. ? Change your dressing as told by your health care provider. ? Leave stitches (sutures), skin glue, or adhesive strips in place. These skin closures may need to stay in place for 2 weeks or longer. If adhesive strip edges start to loosen and curl up, you may trim the loose edges. Do not remove adhesive strips completely unless your health care provider tells you to do that.  Check your insertion site every day for signs of infection. Check for: ? Redness, swelling, or pain. ? Fluid or blood. ? Pus or a bad smell. ? Warmth.  Do not take baths, swim, or use a hot tub until your health care provider approves.  You may shower 24-48 hours after the procedure, or as directed by your health care provider. ? Remove the dressing and gently wash the site with plain soap and water. ? Pat the area dry with a clean towel. ? Do not rub the site. That could cause bleeding.  Do not apply powder or lotion to the site. Activity   For 24 hours after the procedure, or as directed by your health care provider: ? Do not flex or  bend the affected arm. ? Do not push or pull heavy objects with the affected arm. ? Do not drive yourself home from the hospital or clinic. You may drive 24 hours after the procedure unless your health care provider tells you not to. ? Do not operate machinery or power tools.  Do not lift anything that is heavier than 10 lb (4.5 kg), or the limit that you are told, until your health care provider says that it is safe.  Ask your health care provider when it is okay to: ? Return to work or school. ? Resume usual physical activities or sports. ? Resume sexual activity. General instructions  If the catheter site starts to bleed, raise your arm and put firm pressure on the site. If the bleeding does not stop, get help right away. This is a medical emergency.  If you went home on the same day as your procedure, a responsible adult should be with you for the first 24 hours after you arrive home.  Keep all follow-up visits as told  by your health care provider. This is important. Contact a health care provider if:  You have a fever.  You have redness, swelling, or yellow drainage around your insertion site. Get help right away if:  You have unusual pain at the radial site.  The catheter insertion area swells very fast.  The insertion area is bleeding, and the bleeding does not stop when you hold steady pressure on the area.  Your arm or hand becomes pale, cool, tingly, or numb. These symptoms may represent a serious problem that is an emergency. Do not wait to see if the symptoms will go away. Get medical help right away. Call your local emergency services (911 in the [place] Do not drive yourself to the hospital. Summary  After the procedure, it is common to have bruising and tenderness at the site.  Follow instructions from your health care provider about how to take care of your radial site wound. Check the wound every day for signs of infection.  Do not lift anything that is heavier  than 10 lb (4.5 kg), or the limit that you are told, until your health care provider says that it is safe. This information is not intended to replace advice given to you by your health care provider. Make sure you discuss any questions you have with your health care provider. Document Revised: 06/09/2017 Document Reviewed: 06/09/2017 Elsevier Patient Education  2020 [employer]. __________   Coronary Artery Disease, Female Coronary artery disease (CAD) is a condition in which the arteries that lead to the heart (coronary arteries) become narrow or blocked. The narrowing or blockage can lead to decreased blood flow to the heart. Prolonged reduced blood flow can cause a heart attack (myocardial infarction or MI). This condition may also be called coronary heart disease. Because CAD is the leading cause of death in women, it is important to understand what causes this condition and how it is treated. What are the causes? CAD is most often caused by atherosclerosis. This is the buildup of fat and cholesterol (plaque) on the inside of the arteries. Over time, the plaque may narrow or block the artery, reducing blood flow to the heart. Plaque can also become weak and break off within a coronary artery and cause a sudden blockage. Other less common causes of CAD include:  A blood clot or a piece of a blood clot or other substance that blocks the flow of blood in a coronary artery (embolism).  A tearing of the artery (spontaneous coronary artery dissection).  An enlargement of an artery (aneurysm).  Inflammation (vasculitis) in the artery wall. What increases the risk? The following factors may make you more likely to develop this condition:  Age. Women over age 51 are at a greater risk of CAD.  Family history of CAD.  High blood pressure (hypertension).  Diabetes.  High cholesterol levels.  Tobacco use.  Lack of exercise.  Menopause. ? All postmenopausal women are at greater risk of  CAD. ? Women who have experienced menopause between the ages of 4-45 (early menopause) are at a higher risk of CAD. ? Women who have experienced menopause before age 87 (premature menopause) are at a very high risk of CAD.  Excessive alcohol use.  A diet high in saturated and trans fats, such as fried food and processed meat. Other possible risk factors include:  High stress levels.  Depression.  Obesity.  Sleep apnea. What are the signs or symptoms? Many people do not have any symptoms during  the early stages of CAD. As the condition progresses, symptoms may include:  Chest pain (angina). The pain can: ? Feel like crushing or squeezing, or like a tightness, pressure, fullness, or heaviness in the chest. ? Last more than a few minutes or can stop and recur. The pain tends to get worse with exercise or stress and to fade with rest.  Pain in the arms, neck, jaw, ear, or back.  Unexplained heartburn or indigestion.  Shortness of breath.  Nausea.  Sudden cold sweats.  Sudden light-headedness.  Fluttering or fast heartbeat (palpitations). Many women have chest discomfort and the other symptoms. However, women often have unusual (atypical) symptoms, such as:  Fatigue.  Vomiting.  Unexplained feelings of nervousness or anxiety.  Unexplained weakness.  Dizziness or fainting. How is this diagnosed? This condition is diagnosed based on:  Your family and medical history.  A physical exam.  Tests, including: ? A test to check the electrical signals in your heart (electrocardiogram). ? Exercise stress test. This looks for signs of blockage when the heart is stressed with exercise, such as running on a treadmill. ? Pharmacologic stress test. This test looks for signs of blockage when the heart is being stressed with a medicine. ? Blood tests. ? Coronary angiogram. This is a procedure to look at the coronary arteries to see if there is any blockage. During this test, a  dye is injected into your arteries so they appear on an X-ray. ? Coronary artery CT scan. This CT scan helps detect calcium deposits in your coronary arteries. Calcium deposits are an indicator of CAD. ? A test that uses sound waves to take a picture of your heart (echocardiogram). ? Chest X-ray. How is this treated? This condition may be treated by:  Healthy lifestyle changes to reduce risk factors.  Medicines such as: ? Antiplatelet medicines and blood-thinning medicines, such as aspirin. These help to prevent blood clots. ? Nitroglycerin. ? Blood pressure medicines. ? Cholesterol-lowering medicine.  Coronary angioplasty and stenting. During this procedure, a thin, flexible tube is inserted through a blood vessel and into a blocked artery. A balloon or similar device on the end of the tube is inflated to open up the artery. In some cases, a small, mesh tube (stent) is inserted into the artery to keep it open.  Coronary artery bypass surgery. During this surgery, veins or arteries from other parts of the body are used to create a bypass around the blockage and allow blood to reach your heart. Follow these instructions at home: Medicines  Take over-the-counter and prescription medicines only as told by your health care provider.  Do not take the following medicines unless your health care provider approves: ? NSAIDs, such as ibuprofen, naproxen, or celecoxib. ? Vitamin supplements that contain vitamin A, vitamin E, or both. ? Hormone replacement therapy that contains estrogen with or without progestin. Lifestyle  Follow an exercise program approved by your health care provider. Aim for 150 minutes of moderate exercise or 75 minutes of vigorous exercise each week.  Maintain a healthy weight or lose weight as approved by your health care provider.  Learn to manage stress or try to limit your stress. Ask your health care provider for suggestions if you need help.  Get screened for  depression and seek treatment, if needed.  Do not use any products that contain nicotine or tobacco, such as cigarettes, e-cigarettes, and chewing tobacco. If you need help quitting, ask your health care provider.  Do not use illegal  drugs. Eating and drinking   Follow a heart-healthy diet. A dietitian can help educate you about healthy food options and changes. In general, eat plenty of fruits and vegetables, lean meats, and whole grains.  Avoid foods high in: ? Sugar. ? Salt (sodium). ? Saturated fats, such as processed or fatty meat. ? Trans fats, such as fried food.  Use healthy cooking methods such as roasting, grilling, broiling, baking, poaching, steaming, or stir-frying.  Do not drink alcohol if: ? Your health care provider tells you not to drink. ? You are pregnant, may be pregnant, or are planning to become pregnant.  If you drink alcohol: ? Limit how much you have to 0-1 drink a day. ? Be aware of how much alcohol is in your drink. In the [place] one drink equals one 12 oz bottle of beer (355 mL), one 5 oz glass of wine (148 mL), or one 1 oz glass of hard liquor (44 mL). General instructions  Manage any other health conditions, such as hypertension and diabetes. These conditions affect your heart.  Your health care provider may ask you to monitor your blood pressure. Ideally, your blood pressure should be below 130/80.  Keep all follow-up visits as told by your health care provider. This is important. Get help right away if:  You have pain in your chest, neck, ear, arm, jaw, stomach, or back that: ? Lasts more than a few minutes. ? Is recurring. ? Is not relieved by taking medicine under your tongue (sublingual nitroglycerin).  You have profuse sweating without cause.  You have unexplained: ? Heartburn or indigestion. ? Shortness of breath or difficulty breathing. ? Fluttering or fast heartbeat (palpitations). ? Nausea or vomiting. ? Fatigue. ? Feelings of  nervousness or anxiety. ? Weakness. ? Diarrhea.  You have sudden light-headedness or dizziness.  You faint.  You feel like hurting yourself or think about taking your own life. These symptoms may represent a serious problem that is an emergency. Do not wait to see if the symptoms will go away. Get medical help right away. Call your local emergency services (911 in the [place] Do not drive yourself to the hospital. Summary  Coronary artery disease (CAD) is a condition in which the arteries that lead to the heart (coronary arteries) become narrow or blocked. The narrowing or blockage can lead to a heart attack.  Many women have chest discomfort and other common symptoms of CAD. However, women often have unusual (atypical) symptoms, such as fatigue, vomiting, weakness, or dizziness.  CAD can be treated with lifestyle changes, medicines, surgery, or a combination of these treatments. This information is not intended to replace advice given to you by your health care provider. Make sure you discuss any questions you have with your health care provider. Document Revised: 01/21/2018 Document Reviewed: 01/11/2018 Elsevier Patient Education  2020 [employer].

## 2019-09-19 NOTE — Interval H&P Note (Signed)
History and Physical Interval Note:  09/19/2019 11:53 AM  Burley Saver  has presented today for cardiac catheterization, with the diagnosis of chest pain and recent NSTEMI.  The various methods of treatment have been discussed with the patient and family. After consideration of risks, benefits and other options for treatment, the patient has consented to  Procedure(s): LEFT HEART CATH AND CORONARY ANGIOGRAPHY (Left) as a surgical intervention.  The patient's history has been reviewed, patient examined, no change in status, stable for surgery.  I have reviewed the patient's chart and labs.  Questions were answered to the patient's satisfaction.    Cath Lab Visit (complete for each Cath Lab visit)  Clinical Evaluation Leading to the Procedure:   ACS: Yes.    Non-ACS:    Anginal Classification: CCS IV  Anti-ischemic medical therapy: Minimal Therapy (1 class of medications)  Non-Invasive Test Results: Low-risk stress test findings: cardiac mortality <1%/year  Prior CABG: No previous CABG  Kohl Polinsky

## 2019-09-19 NOTE — Progress Notes (Signed)
Patient requested her nortriptyline by 9pm. States it takes awhile to start working for her. Hematoma on right wrist post cath with marked parameters from previous shift. Appears to be stable. Frequently remind patient to keep right wrist elevated on pillow support with very little movement.

## 2019-09-20 ENCOUNTER — Encounter: Payer: Self-pay | Admitting: Cardiology

## 2019-09-20 DIAGNOSIS — I25110 Atherosclerotic heart disease of native coronary artery with unstable angina pectoris: Secondary | ICD-10-CM | POA: Diagnosis not present

## 2019-09-20 DIAGNOSIS — R079 Chest pain, unspecified: Secondary | ICD-10-CM

## 2019-09-20 DIAGNOSIS — Z7902 Long term (current) use of antithrombotics/antiplatelets: Secondary | ICD-10-CM | POA: Diagnosis not present

## 2019-09-20 DIAGNOSIS — Z955 Presence of coronary angioplasty implant and graft: Secondary | ICD-10-CM | POA: Diagnosis not present

## 2019-09-20 DIAGNOSIS — I10 Essential (primary) hypertension: Secondary | ICD-10-CM | POA: Diagnosis not present

## 2019-09-20 DIAGNOSIS — I214 Non-ST elevation (NSTEMI) myocardial infarction: Secondary | ICD-10-CM | POA: Diagnosis not present

## 2019-09-20 DIAGNOSIS — I200 Unstable angina: Secondary | ICD-10-CM | POA: Diagnosis not present

## 2019-09-20 DIAGNOSIS — E785 Hyperlipidemia, unspecified: Secondary | ICD-10-CM | POA: Diagnosis not present

## 2019-09-20 DIAGNOSIS — E1159 Type 2 diabetes mellitus with other circulatory complications: Secondary | ICD-10-CM | POA: Diagnosis not present

## 2019-09-20 DIAGNOSIS — Z7982 Long term (current) use of aspirin: Secondary | ICD-10-CM | POA: Diagnosis not present

## 2019-09-20 DIAGNOSIS — E1151 Type 2 diabetes mellitus with diabetic peripheral angiopathy without gangrene: Secondary | ICD-10-CM | POA: Diagnosis not present

## 2019-09-20 DIAGNOSIS — E7800 Pure hypercholesterolemia, unspecified: Secondary | ICD-10-CM | POA: Diagnosis not present

## 2019-09-20 DIAGNOSIS — Z794 Long term (current) use of insulin: Secondary | ICD-10-CM | POA: Diagnosis not present

## 2019-09-20 DIAGNOSIS — Z79899 Other long term (current) drug therapy: Secondary | ICD-10-CM | POA: Diagnosis not present

## 2019-09-20 LAB — CBC
HCT: 39.3 % (ref 36.0–46.0)
Hemoglobin: 13.2 g/dL (ref 12.0–15.0)
MCH: 29.7 pg (ref 26.0–34.0)
MCHC: 33.6 g/dL (ref 30.0–36.0)
MCV: 88.5 fL (ref 80.0–100.0)
Platelets: 226 10*3/uL (ref 150–400)
RBC: 4.44 MIL/uL (ref 3.87–5.11)
RDW: 12.9 % (ref 11.5–15.5)
WBC: 10.2 10*3/uL (ref 4.0–10.5)
nRBC: 0 % (ref 0.0–0.2)

## 2019-09-20 LAB — BASIC METABOLIC PANEL
Anion gap: 9 (ref 5–15)
BUN: 15 mg/dL (ref 8–23)
CO2: 24 mmol/L (ref 22–32)
Calcium: 8.9 mg/dL (ref 8.9–10.3)
Chloride: 101 mmol/L (ref 98–111)
Creatinine, Ser: 0.86 mg/dL (ref 0.44–1.00)
GFR calc Af Amer: >60 mL/min (ref >60)
GFR calc non Af Amer: >60 mL/min (ref >60)
Glucose, Bld: 235 mg/dL — ABNORMAL HIGH (ref 70–99)
Potassium: 4.1 mmol/L (ref 3.5–5.1)
Sodium: 134 mmol/L — ABNORMAL LOW (ref 135–145)

## 2019-09-20 LAB — GLUCOSE, CAPILLARY: Glucose-Capillary: 235 mg/dL — ABNORMAL HIGH (ref 70–99)

## 2019-09-20 NOTE — Progress Notes (Signed)
Patient ambulated outside of room with standby assistance and walker. Tolerated well. Discharge instructions given to patient. Verbalized understanding. No acute distress at this time. Patient to call for transportation home.

## 2019-09-20 NOTE — Plan of Care (Signed)
Bruising without change to right wrist.

## 2019-09-20 NOTE — Discharge Summary (Signed)
Discharge Summary    Patient ID: Cynthia Singh MRN: [id number]; DOB: 10-17-1940  Admit date: 09/19/2019 Discharge date: 09/20/2019  Primary Care Provider: Venia Carbon, MD  Primary Cardiologist: Nelva Bush, MD  Primary Electrophysiologist:  None   Discharge Diagnoses    Principal Problem:   Unstable angina [facility]) Active Problems:   Poorly controlled type 2 diabetes mellitus with circulatory disorder ([place])   CAD (coronary artery disease)   Hyperlipidemia   Essential hypertension   Diagnostic Studies/Procedures    Cardiac Catheterization and Percutaneous Coronary Intervention 5.4.2021   Left Main  Vessel is large. Vessel is angiographically normal.  Left Anterior Descending  Vessel is large.  Ost LAD to Prox LAD lesion 30% stenosed  Ost LAD to Prox LAD lesion is 30% stenosed.  Prox LAD to Mid LAD lesion 0% stenosed  Previously placed Prox LAD to Mid LAD stent (unknown type) is widely patent.  Mid LAD lesion 40% stenosed  Mid LAD lesion is 40% stenosed.  First Diagonal Branch  Vessel is moderate in size.  1st Diag lesion 40% stenosed  1st Diag lesion is 40% stenosed.  Second Diagonal Branch  Vessel is small in size.  Left Circumflex  Vessel is moderate in size.  Mid Cx lesion 70% stenosed  Mid Cx lesion is 70% stenosed.  First Obtuse Marginal Branch  Vessel is small in size.  Second Obtuse Marginal Branch  Vessel is moderate in size.  Right Coronary Artery  Vessel is large.  Prox RCA lesion 50% stenosed  Prox RCA lesion is 50% stenosed.  Prox RCA to Mid RCA lesion 15% stenosed  Prox RCA to Mid RCA lesion is 15% stenosed. The lesion was previously treated.  Dist RCA lesion 95% stenosed  Dist RCA lesion is 95% stenosed.      **The proximal RCA was successfully treated with a 3.0x18mm Synergy DES.  The distal RCA was successfully treated with a 3.0 x 34mm Synergy DES.  Right Posterior Descending Artery  Vessel is moderate in size.  Right Posterior  Atrioventricular Artery  Vessel is large in size.  RPAV lesion 60% stenosed  RPAV lesion is 60% stenosed.  Third Right Posterolateral Branch  3rd RPL lesion 80% stenosed  3rd RPL lesion is 80% stenosed.  _____________   History of Present Illness     Cynthia Singh is a 79 y.o. female with a h/o CAD s/p prior RCA stenting in 2014 w/ PCI/DES to the LAD and PTCA of the D1 in 01/2015, DM, bilateral subdural hematoma in the setting of mechanical fall in 2018 while on DAPT, HTN, HL, dilated ascending aorta, and arthritis.  She was recently admitted to [facility] in 08/2019 in the setting of chest, posterior neck, and back pain with hypertensive urgency.  CTA of the chest was negative for dissection.  HsTroponin rose to 150.  Echo showed nl LV function (60-65%) and stress testing was non-ischemic.  She was subsequently discharged and followed-up in clinic on 4/27, at which time she reported ongoing, intermittent bilateral arm and neck pain.  As a result of ongoing symptoms, decision was made to pursue diagnostic catheterizaiton.  Hospital Course     Consultants: None   Pt presented to the [facility] cardiac catheterization laboratory on 09/19/2019 and underwent diagnostic catheterization revealing patent LAD and mid RCA stents w/ moderate proximal/mid LAD and LCX disease, and severe proximal/mid RCA disease.  The RCA was felt to be the culprit and was successfully treated with two DES, overlapping the previously placed  mid RCA stent.  Patient tolerated the procedure well and post-procedure, she has been ambulating without recurrent symptoms or limitations.  She will be discharged home today in good condition with follow-up arranged for next week.  Did the patient have an acute coronary syndrome (MI, NSTEMI, STEMI, etc) this admission?:  No                               Did the patient have a percutaneous coronary intervention (stent / angioplasty)?:  Yes.     Cath/PCI Registry Performance & Quality  Measures: 1. Aspirin prescribed? - Yes 2. ADP Receptor Inhibitor (Plavix/Clopidogrel, Brilinta/Ticagrelor or Effient/Prasugrel) prescribed (includes medically managed patients)? - Yes 3. High Intensity Statin (Lipitor 40-80mg  or Crestor 20-40mg ) prescribed? - Yes 4. For EF <40%, was ACEI/ARB prescribed? - Not Applicable (EF >/= [id number]) 5. For EF <40%, Aldosterone Antagonist (Spironolactone or Eplerenone) prescribed? - Not Applicable (EF >/= [id number]) 6. Cardiac Rehab Phase II ordered (Included Medically managed Patients)? - Yes  _____________  Physical Exam   Discharge Vitals Blood pressure 124/61, pulse 62, temperature 97.8 F (36.6 C), temperature source Oral, resp. rate 18, height 5\' 8"  (1.727 m), weight 83.9 kg, SpO2 93 %.  Filed Weights   09/19/19 1053 09/19/19 1727 09/20/19 0434  Weight: 85.7 kg 86.7 kg 83.9 kg    GEN: Well nourished, well developed, in no acute distress.  HEENT: Grossly normal.  Neck: Supple, no JVD, carotid bruits, or masses. Cardiac: RRR, no murmurs, rubs, or gallops. No clubbing, cyanosis, edema.  Radials/DP/PT 2+ and equal bilaterally. R radial cath site w/o bleeding/bruit/hematoma. Respiratory:  Respirations regular and unlabored, clear to auscultation bilaterally. GI: Soft, nontender, nondistended, BS + x 4. MS: no deformity or atrophy. Skin: warm and dry, no rash. Neuro:  Strength and sensation are intact. Psych: AAOx3.  Normal affect.  Labs & Radiologic Studies    CBC Recent Labs    09/20/19 0618  WBC 10.2  HGB 13.2  HCT 39.3  MCV 88.5  PLT [id number]   Basic Metabolic Panel Recent Labs    09/20/19 0618  NA 134*  K 4.1  CL 101  CO2 24  GLUCOSE 235*  BUN 15  CREATININE 0.86  CALCIUM 8.9   High Sensitivity Troponin:   Recent Labs  Lab 09/04/19 1930 09/05/19 0859 09/05/19 1247 09/05/19 2241 09/06/19 0205  TROPONINIHS 23* 182* 111* 150* 145*   _____________   Disposition   Pt is being discharged home today in good  condition.  Follow-up Plans & Appointments    Follow-up Information    End, Harrell Gave, MD Follow up on 09/27/2019.   Specialty: Cardiology Why: Appointment time 10:20 AM. Please arrive 15-20 minutes early to allow for transit and screening.  Contact information: [place] [address] [phone]          Discharge Instructions    AMB Referral to Cardiac Rehabilitation - Phase II   Complete by: As directed    Diagnosis:  Coronary Stents NSTEMI     After initial evaluation and assessments completed: Virtual Based Care may be provided alone or in conjunction with Phase 2 Cardiac Rehab based on patient barriers.: Yes   Call MD for:  difficulty breathing, headache or visual disturbances   Complete by: As directed    Call MD for:  redness, tenderness, or signs of infection (pain, swelling, redness, odor or green/yellow discharge around incision site)   Complete  by: As directed    Call MD for:  severe uncontrolled pain   Complete by: As directed    Diet - low sodium heart healthy   Complete by: As directed    Increase activity slowly   Complete by: As directed       Discharge Medications   Allergies as of 09/20/2019   No Known Allergies     Medication List    TAKE these medications   aspirin EC 81 MG tablet Take 81 mg by mouth daily.   atorvastatin 80 MG tablet Commonly known as: LIPITOR TAKE 1/2 TABLETS (40 MG TOTAL) BY MOUTH DAILY.   carvedilol 25 MG tablet Commonly known as: COREG Take 1 tablet (25 mg total) by mouth 2 (two) times daily with a meal.   clopidogrel 75 MG tablet Commonly known as: PLAVIX TAKE 1 TABLET BY MOUTH EVERY DAY   Cranberry 500 MG Caps Take 1,000 mg by mouth daily.   FISH OIL PO Take 1 capsule by mouth daily.   Jardiance 25 MG Tabs tablet Generic drug: empagliflozin Take 25 mg by mouth daily before breakfast.   Lantus SoloStar 100 UNIT/ML Solostar Pen Generic drug: insulin glargine TAKE 36 UNITS  DAILY UNDER SKIN   losartan-hydrochlorothiazide 100-12.5 MG tablet Commonly known as: HYZAAR Take 1 tablet by mouth daily.   metFORMIN 500 MG 24 hr tablet Commonly known as: GLUCOPHAGE-XR TAKE 2 TABLETS BY MOUTH EVERY DAY WITH BREAKFAST   nitroGLYCERIN 0.4 MG SL tablet Commonly known as: Nitrostat Place 1 tablet (0.4 mg total) under the tongue every 5 (five) minutes as needed for chest pain.   nortriptyline 50 MG capsule Commonly known as: PAMELOR TAKE 3 CAPSULES (150 MG TOTAL) BY MOUTH AT BEDTIME.   oxyCODONE-acetaminophen 10-325 MG tablet Commonly known as: PERCOCET Take 1-2 tablets by mouth every 6 (six) hours as needed for pain. M54.9   Pen Needles 32G X 5 MM Misc 1 Units by Does not apply route daily.   polyethylene glycol 17 g packet Commonly known as: MIRALAX / GLYCOLAX Take 17 g by mouth daily as needed.         Outstanding Labs/Studies   None  Duration of Discharge Encounter   Greater than 30 minutes including physician time.  Signed, Murray Hodgkins, NP 09/20/2019, 9:11 AM

## 2019-09-26 NOTE — Progress Notes (Deleted)
Follow-up Outpatient Visit Date: 09/27/2019  Primary Care Provider: Venia Carbon, MD [place] [address]  Chief Complaint: ***  HPI:  Cynthia Singh is a 79 y.o. female with history of artery disease status post multiple PCI's, poorly controlled diabetes mellitus, bilateral subdural hematomas in the setting of mechanical fall in 2018 while on dual antiplatelet therapy, hypertension, hyperlipidemia, dilated ascending aorta, and osteoarthritis, who presents for follow-up of coronary artery disease.  She was hospitalized last month with shoulder/upper back pain and elevated troponin thought to be due to NSTEMI.  The patient declined catheterization at that time and was managed medically.  Subsequent myocardial perfusion stress test was low risk.  However, given continued symptoms after discharge, she was referred for coronary angiography and found to have moderate proximal and severe distal RCA disease as well as moderate to severe mid LCx stenosis.  She underwent stenting of the proximal and distal RCA with resolution of her symptoms.  --------------------------------------------------------------------------------------------------  Past Medical History:  Diagnosis Date  . CAD (coronary artery disease)    a. Remote nonobstructive disease but in 10/2012 Cath/PCI: s/p DES to RCA. b. cath 02/08/15 DES to prox LAD and balloon angioplasty of ost D1, EF 55-65%; c. 08/2019 PCI/DES prox/distal RCA (overlapping prev RCA stent). Prev placed RCA/LAD stents patent.  . Concussion    [place] AFTER FALL 3/18  . Depression   . Diabetes mellitus, type 2 ([place])   . Dyslipidemia   . Episodic mood disorder ([place])   . Hypertension   . Myocardial infarction ([place])    2014  . Obesity   . Osteoarthritis of spine    knees also  . Peripheral vascular disease, unspecified ([place]) 2020   severe disease on home screening by insurance   Past Surgical History:  Procedure Laterality Date  .  ABDOMINAL HYSTERECTOMY    . APPENDECTOMY  1959  . CARDIAC CATHETERIZATION N/A 02/08/2015   Procedure: Left Heart Cath and Coronary Angiography;  Surgeon: Sherren Mocha, MD;  Location: [place] [facility];  Service: Cardiovascular;  Laterality: N/A;  . CARDIAC CATHETERIZATION N/A 02/08/2015   Procedure: Coronary Stent Intervention;  Surgeon: Sherren Mocha, MD;  Location: [place] [facility];  Service: Cardiovascular;  Laterality: N/A;  . CARDIAC CATHETERIZATION N/A 02/08/2015   Procedure: Intravascular Pressure Wire/FFR Study;  Surgeon: Sherren Mocha, MD;  Location: [place] [facility];  Service: Cardiovascular;  Laterality: N/A;  . CATARACT EXTRACTION W/PHACO Left 11/10/2016   Procedure: CATARACT EXTRACTION PHACO AND INTRAOCULAR LENS PLACEMENT ([place]) suture placed in left eye at Lanisha Stepanian of procedure;  Surgeon: Birder Robson, MD;  Location: [facility];  Service: Ophthalmology;  Laterality: Left;  [place]  3.20 AP% 27.7 CDE 55.63 Fluid pack lot # [id number]  . CHOLECYSTECTOMY    . COMBINED HYSTERECTOMY ABDOMINAL W/ A&P REPAIR / OOPHORECTOMY  1968  . CORONARY ANGIOPLASTY     STENTS X 5  . CORONARY STENT INTERVENTION N/A 09/19/2019   Procedure: CORONARY STENT INTERVENTION;  Surgeon: Nelva Bush, MD;  Location: [place] [facility];  Service: Cardiovascular;  Laterality: N/A;  . CORONARY STENT PLACEMENT  2014  . DILATION AND CURETTAGE OF UTERUS    . [place]  . LEFT HEART CATH AND CORONARY ANGIOGRAPHY Left 09/19/2019   Procedure: LEFT HEART CATH AND CORONARY ANGIOGRAPHY;  Surgeon: Nelva Bush, MD;  Location: [place] [facility];  Service: Cardiovascular;  Laterality: Left;  . LEFT HEART CATHETERIZATION WITH CORONARY ANGIOGRAM N/A 10/20/2012   Procedure:  LEFT HEART CATHETERIZATION WITH CORONARY ANGIOGRAM;  Surgeon: Sherren Mocha, MD;  Location: [facility] C[facility];  Service: Cardiovascular;  Laterality: N/A;  . multiple D&C    . TONSILLECTOMY  age 87    No outpatient medications have been  marked as taking for the 09/27/19 encounter (Appointment) with Marcelene Weidemann, Harrell Gave, MD.    Allergies: Patient has no known allergies.  Social History   Tobacco Use  . Smoking status: Never Smoker  . Smokeless tobacco: Never Used  Substance Use Topics  . Alcohol use: No  . Drug use: No    Family History  Adopted: Yes  Problem Relation Age of Onset  . Cancer Brother        Colon  . Coronary artery disease Neg Hx   . Heart attack Neg Hx     Review of Systems: A 12-system review of systems was performed and was negative except as noted in the HPI.  --------------------------------------------------------------------------------------------------  Physical Exam: There were no vitals taken for this visit.  General:  *** HEENT: No conjunctival pallor or scleral icterus. Facemask in place. Neck: Supple without lymphadenopathy, thyromegaly, JVD, or HJR. Lungs: Normal work of breathing. Clear to auscultation bilaterally without wheezes or crackles. Heart: Regular rate and rhythm without murmurs, rubs, or gallops. Non-displaced PMI. Abd: Bowel sounds present. Soft, NT/ND without hepatosplenomegaly Ext: No lower extremity edema. Radial, PT, and DP pulses are 2+ bilaterally. Skin: Warm and dry without rash.  EKG:  ***  Lab Results  Component Value Date   WBC 10.2 09/20/2019   HGB 13.2 09/20/2019   HCT 39.3 09/20/2019   MCV 88.5 09/20/2019   PLT 226 09/20/2019    Lab Results  Component Value Date   NA 134 (L) 09/20/2019   K 4.1 09/20/2019   CL 101 09/20/2019   CO2 24 09/20/2019   BUN 15 09/20/2019   CREATININE 0.86 09/20/2019   GLUCOSE 235 (H) 09/20/2019   ALT 14 09/04/2019    Lab Results  Component Value Date   CHOL 158 09/05/2019   HDL 35 (L) 09/05/2019   LDLCALC 62 09/05/2019   LDLDIRECT 75.0 05/24/2019   TRIG 305 (H) 09/05/2019   CHOLHDL 4.5 09/05/2019     --------------------------------------------------------------------------------------------------  ASSESSMENT AND PLAN: Nelva Bush, MD 09/26/2019 4:28 PM

## 2019-09-27 ENCOUNTER — Ambulatory Visit: Payer: Medicare Other | Admitting: Internal Medicine

## 2019-09-27 NOTE — Progress Notes (Signed)
Follow-up Outpatient Visit Date: 09/29/2019  Primary Care Provider: Venia Carbon, MD [place] [address]  Chief Complaint: Follow-up coronary artery disease with recent PCI  HPI:  Cynthia Singh is a 79 y.o. female with history of artery disease status post multiple PCI's, poorly controlled diabetes mellitus, bilateral subdural hematomas in the setting of mechanical fall in 2018 while on dual antiplatelet therapy, hypertension, hyperlipidemia, dilated ascending aorta, and osteoarthritis, who presents for follow-up of coronary artery disease.  She was hospitalized last month with shoulder/upper back pain and elevated troponin thought to be due to NSTEMI.  The patient declined catheterization at that time and was managed medically.  Subsequent myocardial perfusion stress test was low risk.  However, given continued symptoms after discharge, she was referred for coronary angiography and found to have moderate proximal and severe distal RCA disease as well as moderate to severe mid LCx stenosis.  She underwent stenting of the proximal and distal RCA with resolution of her symptoms.  Today, [name] reports she is feeling a little bit better.  She has not had any chest pain but continues to have left upper back and shoulder pain.  It has improved with PCI, though it has not completely resolved.  It is not clearly exertional or related to other activities.  She has noted quite a bit of bruising on the right forearm following radial catheterization.  There is slight tenderness but otherwise no discomfort.  She is tolerating her medications well.  She has stable shortness of breath.  She has not had any edema or bleeding.  --------------------------------------------------------------------------------------------------  Past Medical History:  Diagnosis Date  . CAD (coronary artery disease)    a. Remote nonobstructive disease but in 10/2012 Cath/PCI: s/p DES to RCA. b. cath  02/08/15 DES to prox LAD and balloon angioplasty of ost D1, EF 55-65%; c. 08/2019 PCI/DES prox/distal RCA (overlapping prev RCA stent). Prev placed RCA/LAD stents patent.  . Concussion    [place] AFTER FALL 3/18  . Depression   . Diabetes mellitus, type 2 ([place])   . Dyslipidemia   . Episodic mood disorder ([place])   . Hypertension   . Myocardial infarction ([place])    2014  . Obesity   . Osteoarthritis of spine    knees also  . Peripheral vascular disease, unspecified ([place]) 2020   severe disease on home screening by insurance   Past Surgical History:  Procedure Laterality Date  . ABDOMINAL HYSTERECTOMY    . APPENDECTOMY  1959  . CARDIAC CATHETERIZATION N/A 02/08/2015   Procedure: Left Heart Cath and Coronary Angiography;  Surgeon: Sherren Mocha, MD;  Location: [place] [facility];  Service: Cardiovascular;  Laterality: N/A;  . CARDIAC CATHETERIZATION N/A 02/08/2015   Procedure: Coronary Stent Intervention;  Surgeon: Sherren Mocha, MD;  Location: [place] [facility];  Service: Cardiovascular;  Laterality: N/A;  . CARDIAC CATHETERIZATION N/A 02/08/2015   Procedure: Intravascular Pressure Wire/FFR Study;  Surgeon: Sherren Mocha, MD;  Location: [place] [facility];  Service: Cardiovascular;  Laterality: N/A;  . CATARACT EXTRACTION W/PHACO Left 11/10/2016   Procedure: CATARACT EXTRACTION PHACO AND INTRAOCULAR LENS PLACEMENT ([employer]) suture placed in left eye at Rayce Brahmbhatt of procedure;  Surgeon: Birder Robson, MD;  Location: [facility];  Service: Ophthalmology;  Laterality: Left;  [place]  3.20 AP% 27.7 CDE 55.63 Fluid pack lot # [id number]  . CHOLECYSTECTOMY    . COMBINED HYSTERECTOMY ABDOMINAL W/ A&P REPAIR / OOPHORECTOMY  1968  . CORONARY ANGIOPLASTY  STENTS X 5  . CORONARY STENT INTERVENTION N/A 09/19/2019   Procedure: CORONARY STENT INTERVENTION;  Surgeon: Nelva Bush, MD;  Location: [place] [facility];  Service: Cardiovascular;  Laterality: N/A;  . CORONARY STENT PLACEMENT  2014  . DILATION AND  CURETTAGE OF UTERUS    . [place]  . LEFT HEART CATH AND CORONARY ANGIOGRAPHY Left 09/19/2019   Procedure: LEFT HEART CATH AND CORONARY ANGIOGRAPHY;  Surgeon: Nelva Bush, MD;  Location: [place] [facility];  Service: Cardiovascular;  Laterality: Left;  . LEFT HEART CATHETERIZATION WITH CORONARY ANGIOGRAM N/A 10/20/2012   Procedure: LEFT HEART CATHETERIZATION WITH CORONARY ANGIOGRAM;  Surgeon: Sherren Mocha, MD;  Location: [facility] C[facility] LAB;  Service: Cardiovascular;  Laterality: N/A;  . multiple D&C    . TONSILLECTOMY  age 14    Current Meds  Medication Sig  . aspirin EC 81 MG tablet Take 81 mg by mouth daily.  Marland Kitchen atorvastatin (LIPITOR) 80 MG tablet TAKE 1/2 TABLETS (40 MG TOTAL) BY MOUTH DAILY.  . carvedilol (COREG) 25 MG tablet Take 1 tablet (25 mg total) by mouth 2 (two) times daily with a meal.  . clopidogrel (PLAVIX) 75 MG tablet TAKE 1 TABLET BY MOUTH EVERY DAY  . Cranberry 500 MG CAPS Take 1,000 mg by mouth daily.  . empagliflozin (JARDIANCE) 25 MG TABS tablet Take 25 mg by mouth daily before breakfast.  . Insulin Glargine (LANTUS SOLOSTAR) 100 UNIT/ML Solostar Pen TAKE 36 UNITS DAILY UNDER SKIN  . Insulin Pen Needle (PEN NEEDLES) 32G X 5 MM MISC 1 Units by Does not apply route daily.  Marland Kitchen losartan-hydrochlorothiazide (HYZAAR) 100-12.5 MG tablet Take 1 tablet by mouth daily.  . metFORMIN (GLUCOPHAGE-XR) 500 MG 24 hr tablet TAKE 2 TABLETS BY MOUTH EVERY DAY WITH BREAKFAST  . nitroGLYCERIN (NITROSTAT) 0.4 MG SL tablet Place 1 tablet (0.4 mg total) under the tongue every 5 (five) minutes as needed for chest pain.  . nortriptyline (PAMELOR) 50 MG capsule TAKE 3 CAPSULES (150 MG TOTAL) BY MOUTH AT BEDTIME.  Marland Kitchen Omega-3 Fatty Acids (FISH OIL PO) Take 1 capsule by mouth daily.  Marland Kitchen oxyCODONE-acetaminophen (PERCOCET) 10-325 MG tablet Take 1-2 tablets by mouth every 6 (six) hours as needed for pain. M54.9  . polyethylene glycol (MIRALAX / GLYCOLAX) packet Take 17 g by mouth daily as  needed.     Allergies: Patient has no known allergies.  Social History   Tobacco Use  . Smoking status: Never Smoker  . Smokeless tobacco: Never Used  Substance Use Topics  . Alcohol use: No  . Drug use: No    Family History  Adopted: Yes  Problem Relation Age of Onset  . Cancer Brother        Colon  . Coronary artery disease Neg Hx   . Heart attack Neg Hx     Review of Systems: A 12-system review of systems was performed and was negative except as noted in the HPI.  --------------------------------------------------------------------------------------------------  Physical Exam: BP (!) 148/84 (BP Location: Left Arm, Patient Position: Sitting, Cuff Size: Normal)   Pulse 82   Ht 5\' 8"  (624THL m)   Wt 187 lb 4 oz (84.9 kg)   SpO2 98%   BMI 28.47 kg/m   General: NAD. Neck: No JVD or HJR. Heart: Regular rate and rhythm without murmurs, rubs, or gallops. Lungs: Clear to auscultation without wheezes or crackles. Abdomen: Soft, nontender, nondistended. Extremities: No lower extremity edema.  Bruising noted along the right forearm with tenderness  along the medial aspect of the proximal right forearm.  Right radial arteriotomy site well-healed.  There is a small overlying hematoma without bruit.  Radial pulse is 2+.  EKG: Normal sinus rhythm with LVH and borderline QRS widening as well as inferior Q waves.  No significant change from prior tracing on 09/20/2019.  Lab Results  Component Value Date   WBC 10.2 09/20/2019   HGB 13.2 09/20/2019   HCT 39.3 09/20/2019   MCV 88.5 09/20/2019   PLT 226 09/20/2019    Lab Results  Component Value Date   NA 134 (L) 09/20/2019   K 4.1 09/20/2019   CL 101 09/20/2019   CO2 24 09/20/2019   BUN 15 09/20/2019   CREATININE 0.86 09/20/2019   GLUCOSE 235 (H) 09/20/2019   ALT 14 09/04/2019    Lab Results  Component Value Date   CHOL 158 09/05/2019   HDL 35 (L) 09/05/2019   LDLCALC 62 09/05/2019   LDLDIRECT 75.0 05/24/2019    TRIG 305 (H) 09/05/2019   CHOLHDL 4.5 09/05/2019    --------------------------------------------------------------------------------------------------  ASSESSMENT AND PLAN: Coronary artery disease with stable angina: Ms. Chiodi continues to have some back and left shoulder discomfort which is thought to be an anginal equivalent.  It has improved somewhat with PCI to the RCA earlier this month.  I suspect that there is a component of musculoskeletal pain.  It should be noted, however, that there is moderate to severe disease involving the mid/distal LCx/OM branch that was not revascularized during recent catheterization.  We have agreed to optimize medical therapy; as such, we will add amlodipine 2.5 mg daily for blood pressure and antianginal therapy.  Ms. Tuss should continue carvedilol 25 mg twice daily as well as dual antiplatelet therapy with aspirin and clopidogrel.  Hyperlipidemia: Continue atorvastatin 80 mg daily.  Hypertension: Blood pressure modestly elevated.  We will continue current doses of carvedilol and losartan-HCTZ, as well as adding amlodipine 2.5.  Follow-up: Return to clinic in 6 weeks.  Nelva Bush, MD 09/30/2019 6:01 PM

## 2019-09-28 ENCOUNTER — Other Ambulatory Visit: Payer: Self-pay

## 2019-09-28 ENCOUNTER — Encounter: Payer: Self-pay | Admitting: Internal Medicine

## 2019-09-28 ENCOUNTER — Ambulatory Visit (INDEPENDENT_AMBULATORY_CARE_PROVIDER_SITE_OTHER): Payer: Medicare Other | Admitting: Internal Medicine

## 2019-09-28 DIAGNOSIS — E1165 Type 2 diabetes mellitus with hyperglycemia: Secondary | ICD-10-CM

## 2019-09-28 DIAGNOSIS — E1159 Type 2 diabetes mellitus with other circulatory complications: Secondary | ICD-10-CM

## 2019-09-28 DIAGNOSIS — I2510 Atherosclerotic heart disease of native coronary artery without angina pectoris: Secondary | ICD-10-CM

## 2019-09-28 DIAGNOSIS — G8929 Other chronic pain: Secondary | ICD-10-CM

## 2019-09-28 DIAGNOSIS — Z9861 Coronary angioplasty status: Secondary | ICD-10-CM

## 2019-09-28 DIAGNOSIS — M5440 Lumbago with sciatica, unspecified side: Secondary | ICD-10-CM

## 2019-09-28 NOTE — Assessment & Plan Note (Signed)
Discussed how this is a factor in her CAD, etc Despite metformin, jardiance and insulin--still poor control Has not been motivated---but will have her call Dr Cruzita Lederer to reestablish

## 2019-09-28 NOTE — Assessment & Plan Note (Signed)
Multiple angioplasties and now 2 more stents On DAPT for now Statin, ARB, beta blocker

## 2019-09-28 NOTE — Progress Notes (Signed)
Subjective:    Patient ID: Cynthia Singh, female    DOB: 08/21/1940, 79 y.o.   MRN: [id number]  HPI  Here for hospital follow up This visit occurred during the SARS-CoV-2 public health emergency.  Safety protocols were in place, including screening questions prior to the visit, additional usage of staff PPE, and extensive cleaning of exam room while observing appropriate contact time as indicated for disinfecting solutions.   Admitted last month with chest pain NSTEMI diagnosed She preferred to hold off on another cath Had stress test that wasn't worrisome so sent home  On cardiology follow up, still having symptoms Readmitted for cath RCA had 2 stents put in  Still not feeling good--especially having pain in arms---left is worse at night Not sleeping well  Bothered by the bruising in her right forearm Some mild pain there---but no loss of hand function Chest pain is gone Breathing is fine  Is taking her diabetes medication Fasting usually ~200 Not cooking much---mostly ordering in (Pizza, broiled shrimp from seafood restaurant)  Current Outpatient Medications on File Prior to Visit  Medication Sig Dispense Refill  . aspirin EC 81 MG tablet Take 81 mg by mouth daily.    Marland Kitchen atorvastatin (LIPITOR) 80 MG tablet TAKE 1/2 TABLETS (40 MG TOTAL) BY MOUTH DAILY. 45 tablet 3  . carvedilol (COREG) 25 MG tablet Take 1 tablet (25 mg total) by mouth 2 (two) times daily with a meal. 60 tablet 6  . clopidogrel (PLAVIX) 75 MG tablet TAKE 1 TABLET BY MOUTH EVERY DAY 90 tablet 3  . Cranberry 500 MG CAPS Take 1,000 mg by mouth daily.    . empagliflozin (JARDIANCE) 25 MG TABS tablet Take 25 mg by mouth daily before breakfast. 30 tablet 5  . Insulin Glargine (LANTUS SOLOSTAR) 100 UNIT/ML Solostar Pen TAKE 36 UNITS DAILY UNDER SKIN 15 mL 11  . Insulin Pen Needle (PEN NEEDLES) 32G X 5 MM MISC 1 Units by Does not apply route daily. 100 each 3  . losartan-hydrochlorothiazide (HYZAAR) 100-12.5 MG  tablet Take 1 tablet by mouth daily. 90 tablet 3  . metFORMIN (GLUCOPHAGE-XR) 500 MG 24 hr tablet TAKE 2 TABLETS BY MOUTH EVERY DAY WITH BREAKFAST 180 tablet 3  . nitroGLYCERIN (NITROSTAT) 0.4 MG SL tablet Place 1 tablet (0.4 mg total) under the tongue every 5 (five) minutes as needed for chest pain. 30 tablet 0  . nortriptyline (PAMELOR) 50 MG capsule TAKE 3 CAPSULES (150 MG TOTAL) BY MOUTH AT BEDTIME. 270 capsule 3  . Omega-3 Fatty Acids (FISH OIL PO) Take 1 capsule by mouth daily.    Marland Kitchen oxyCODONE-acetaminophen (PERCOCET) 10-325 MG tablet Take 1-2 tablets by mouth every 6 (six) hours as needed for pain. M54.9 150 tablet 0  . polyethylene glycol (MIRALAX / GLYCOLAX) packet Take 17 g by mouth daily as needed.      No current facility-administered medications on file prior to visit.    No Known Allergies  Past Medical History:  Diagnosis Date  . CAD (coronary artery disease)    a. Remote nonobstructive disease but in 10/2012 Cath/PCI: s/p DES to RCA. b. cath 02/08/15 DES to prox LAD and balloon angioplasty of ost D1, EF 55-65%; c. 08/2019 PCI/DES prox/distal RCA (overlapping prev RCA stent). Prev placed RCA/LAD stents patent.  . Concussion    [place] AFTER FALL 3/18  . Depression   . Diabetes mellitus, type 2 ([place])   . Dyslipidemia   . Episodic mood disorder ([place])   . Hypertension   .  Myocardial infarction ([place])    2014  . Obesity   . Osteoarthritis of spine    knees also  . Peripheral vascular disease, unspecified ([place]) 2020   severe disease on home screening by insurance    Past Surgical History:  Procedure Laterality Date  . ABDOMINAL HYSTERECTOMY    . APPENDECTOMY  1959  . CARDIAC CATHETERIZATION N/A 02/08/2015   Procedure: Left Heart Cath and Coronary Angiography;  Surgeon: Sherren Mocha, MD;  Location: [place] [facility];  Service: Cardiovascular;  Laterality: N/A;  . CARDIAC CATHETERIZATION N/A 02/08/2015   Procedure: Coronary Stent Intervention;  Surgeon: Sherren Mocha, MD;   Location: [place] [facility];  Service: Cardiovascular;  Laterality: N/A;  . CARDIAC CATHETERIZATION N/A 02/08/2015   Procedure: Intravascular Pressure Wire/FFR Study;  Surgeon: Sherren Mocha, MD;  Location: [place] [facility];  Service: Cardiovascular;  Laterality: N/A;  . CATARACT EXTRACTION W/PHACO Left 11/10/2016   Procedure: CATARACT EXTRACTION PHACO AND INTRAOCULAR LENS PLACEMENT ([place]) suture placed in left eye at end of procedure;  Surgeon: Birder Robson, MD;  Location: [facility];  Service: Ophthalmology;  Laterality: Left;  [place]  3.20 AP% 27.7 CDE 55.63 Fluid pack lot # [id number]  . CHOLECYSTECTOMY    . COMBINED HYSTERECTOMY ABDOMINAL W/ A&P REPAIR / OOPHORECTOMY  1968  . CORONARY ANGIOPLASTY     STENTS X 5  . CORONARY STENT INTERVENTION N/A 09/19/2019   Procedure: CORONARY STENT INTERVENTION;  Surgeon: Nelva Bush, MD;  Location: [place] [facility];  Service: Cardiovascular;  Laterality: N/A;  . CORONARY STENT PLACEMENT  2014  . DILATION AND CURETTAGE OF UTERUS    . [place]  . LEFT HEART CATH AND CORONARY ANGIOGRAPHY Left 09/19/2019   Procedure: LEFT HEART CATH AND CORONARY ANGIOGRAPHY;  Surgeon: Nelva Bush, MD;  Location: [place] [facility];  Service: Cardiovascular;  Laterality: Left;  . LEFT HEART CATHETERIZATION WITH CORONARY ANGIOGRAM N/A 10/20/2012   Procedure: LEFT HEART CATHETERIZATION WITH CORONARY ANGIOGRAM;  Surgeon: Sherren Mocha, MD;  Location: [facility];  Service: Cardiovascular;  Laterality: N/A;  . multiple D&C    . TONSILLECTOMY  age 4    Family History  Adopted: Yes  Problem Relation Age of Onset  . Cancer Brother        Colon  . Coronary artery disease Neg Hx   . Heart attack Neg Hx     Social History   Socioeconomic History  . Marital status: Widowed    Spouse name: Not on file  . Number of children: 3  . Years of education: 13  . Highest education level: Not on file  Occupational History  . Occupation: Scientist, water quality   Tobacco Use  . Smoking status: Never Smoker  . Smokeless tobacco: Never Used  Substance and Sexual Activity  . Alcohol use: No  . Drug use: No  . Sexual activity: Never    Birth control/protection: Post-menopausal  Other Topics Concern  . Not on file  Social History Narrative   Widowed '13. 2 sons- '63, '66; one daughter-'69;    79 grandchildren--has raised 9 of her grandchildren 4 great grandchildren.   Lives with 2 granddaughters and adoptive son      Has living will   Probably would have son Ronita Behner as health care POA--not set up   Would accept resuscitation but no prolonged ventilation   Not sure about tube feeds   Social Determinants of Health   Financial Resource Strain:   . Difficulty of  Paying Living Expenses:   Food Insecurity:   . Worried About Charity fundraiser in the Last Year:   . Arboriculturist in the Last Year:   Transportation Needs:   . Film/video editor (Medical):   Marland Kitchen Lack of Transportation (Non-Medical):   Physical Activity:   . Days of Exercise per Week:   . Minutes of Exercise per Session:   Stress:   . Feeling of Stress :   Social Connections:   . Frequency of Communication with Friends and Family:   . Frequency of Social Gatherings with Friends and Family:   . Attends Religious Services:   . Active Member of Clubs or Organizations:   . Attends Archivist Meetings:   Marland Kitchen Marital Status:   Intimate Partner Violence:   . Fear of Current or Ex-Partner:   . Emotionally Abused:   Marland Kitchen Physically Abused:   . Sexually Abused:    Review of Systems Bowels are slow---does take the miralax daily Is able to eventually sleep---arm settles down ~2AM. Sleeps till 6, gets up to void, then goes back    Objective:   Physical Exam  Constitutional: She appears well-developed. No distress.  Neck: No thyromegaly present.  Cardiovascular: Normal rate, regular rhythm and normal heart sounds. Exam reveals no gallop.  No murmur heard.  Respiratory: Effort normal and breath sounds normal. No respiratory distress. She has no wheezes. She has no rales.  Musculoskeletal:        General: No edema.  Lymphadenopathy:    She has no cervical adenopathy.  Neurological:  Normal strength and function of right hand and arm  Skin:  Yellowish ecchymoses over most of volar right forearm            Assessment & Plan:

## 2019-09-28 NOTE — Patient Instructions (Signed)
Please call Dr Philemon Kingdom to reestablish diabetes care---and really work on healthier eating. Phone-- [phone]. [place]

## 2019-09-28 NOTE — Assessment & Plan Note (Signed)
Ongoing issues Uses the narcotic regularly with some relief

## 2019-09-29 ENCOUNTER — Ambulatory Visit (INDEPENDENT_AMBULATORY_CARE_PROVIDER_SITE_OTHER): Payer: Medicare Other | Admitting: Internal Medicine

## 2019-09-29 ENCOUNTER — Encounter: Payer: Self-pay | Admitting: Internal Medicine

## 2019-09-29 VITALS — BP 148/84 | HR 82 | Ht 68.0 in | Wt 187.2 lb

## 2019-09-29 DIAGNOSIS — I25118 Atherosclerotic heart disease of native coronary artery with other forms of angina pectoris: Secondary | ICD-10-CM

## 2019-09-29 DIAGNOSIS — E785 Hyperlipidemia, unspecified: Secondary | ICD-10-CM | POA: Diagnosis not present

## 2019-09-29 DIAGNOSIS — I10 Essential (primary) hypertension: Secondary | ICD-10-CM | POA: Diagnosis not present

## 2019-09-29 MED ORDER — AMLODIPINE BESYLATE 2.5 MG PO TABS
2.5000 mg | ORAL_TABLET | Freq: Every day | ORAL | 3 refills | Status: DC
Start: 2019-09-29 — End: 2020-10-02

## 2019-09-29 NOTE — Patient Instructions (Signed)
Medication Instructions:  Your physician has recommended you make the following change in your medication:   START Amlodipine 2.5 mg daily. An Rx has been sent to your pharmacy.  *If you need a refill on your cardiac medications before your next appointment, please call your pharmacy*   Lab Work: None ordered If you have labs (blood work) drawn today and your tests are completely normal, you will receive your results only by: Marland Kitchen MyChart Message (if you have MyChart) OR . A paper copy in the mail If you have any lab test that is abnormal or we need to change your treatment, we will call you to review the results.   Testing/Procedures: None ordered   Follow-Up: At [facility], you and your health needs are our priority.  As part of our continuing mission to provide you with exceptional heart care, we have created designated Provider Care Teams.  These Care Teams include your primary Cardiologist (physician) and Advanced Practice Providers (APPs -  Physician Assistants and Nurse Practitioners) who all work together to provide you with the care you need, when you need it.  We recommend signing up for the patient portal called "MyChart".  Sign up information is provided on this After Visit Summary.  MyChart is used to connect with patients for Virtual Visits (Telemedicine).  Patients are able to view lab/test results, encounter notes, upcoming appointments, etc.  Non-urgent messages can be sent to your provider as well.   To learn more about what you can do with MyChart, go to [email or web address].    Your next appointment:   6 week(s)  The format for your next appointment:   In Person  Provider:    You may see one of the following Advanced Practice Providers on your designated Care Team:    Murray Hodgkins, NP  Christell Faith, PA-C  Marrianne Mood, PA-C    Other Instructions We will follow up with Cardiac Rehab regarding your referral.

## 2019-10-02 ENCOUNTER — Telehealth: Payer: Self-pay | Admitting: Internal Medicine

## 2019-10-02 NOTE — Telephone Encounter (Signed)
Attempted to schedule.  LMOV to call office.    6 wk fu from 09/29/19 with APP

## 2019-10-04 ENCOUNTER — Encounter: Payer: Medicare Other | Attending: Internal Medicine

## 2019-10-04 ENCOUNTER — Other Ambulatory Visit: Payer: Self-pay

## 2019-10-04 DIAGNOSIS — I214 Non-ST elevation (NSTEMI) myocardial infarction: Secondary | ICD-10-CM

## 2019-10-04 NOTE — Progress Notes (Signed)
Virtual Visit completed. Patient informed on EP and RD appointment and 6 Minute walk test. Patient also informed of patient health questionnaires on My Chart. Patient Verbalizes understanding. Visit diagnosis can be found in [facility] 09/19/2019.

## 2019-10-06 ENCOUNTER — Telehealth: Payer: Self-pay

## 2019-10-06 DIAGNOSIS — E785 Hyperlipidemia, unspecified: Secondary | ICD-10-CM

## 2019-10-06 DIAGNOSIS — I10 Essential (primary) hypertension: Secondary | ICD-10-CM

## 2019-10-06 NOTE — Telephone Encounter (Signed)
Per written referral from PCP, requesting referral in Epic for Cynthia Singh to chronic care management pharmacy services for the following conditions:   Essential hypertension, benign  [I10]  Hyperlipidemia [E78.5]  Debbora Dus, PharmD Clinical Pharmacist [facility] [phone]

## 2019-10-09 ENCOUNTER — Other Ambulatory Visit: Payer: Self-pay | Admitting: *Deleted

## 2019-10-09 MED ORDER — OXYCODONE-ACETAMINOPHEN 10-325 MG PO TABS: 1.0000 | ORAL_TABLET | Freq: Four times a day (QID) | ORAL | 0 refills | Status: DC | PRN

## 2019-10-09 NOTE — Telephone Encounter (Signed)
Patient left a voicemail stating that she needs a refill on her pain medication  Name of Medication: Percocet Name of Pharmacy: [facility] or Written Date and Quantity: 09/08/19 #150 Last Office Visit and Type: 09/28/19 Next Office Visit and Type: 11/23/19 Last Controlled Substance Agreement Date: 08/29/19 Last UDS: 08/24/19

## 2019-10-10 ENCOUNTER — Other Ambulatory Visit: Payer: Self-pay

## 2019-10-10 ENCOUNTER — Ambulatory Visit: Payer: Medicare Other

## 2019-10-10 DIAGNOSIS — E785 Hyperlipidemia, unspecified: Secondary | ICD-10-CM

## 2019-10-10 DIAGNOSIS — E1165 Type 2 diabetes mellitus with hyperglycemia: Secondary | ICD-10-CM

## 2019-10-10 DIAGNOSIS — E1159 Type 2 diabetes mellitus with other circulatory complications: Secondary | ICD-10-CM

## 2019-10-10 DIAGNOSIS — I10 Essential (primary) hypertension: Secondary | ICD-10-CM

## 2019-10-10 NOTE — Patient Instructions (Signed)
Oct 10, 2019  Dear Cynthia Singh,  It was a pleasure meeting you during our initial appointment on Oct 10, 2019. Below is a summary of the goals we discussed and components of chronic care management. Please contact me anytime with questions or concerns.   Visit Information  Goals Addressed            This Visit's Progress   . Pharmacy Care Plan       CARE PLAN ENTRY  Current Barriers:  . Chronic Disease Management support, education, and care coordination needs related to Hypertension, Hyperlipidemia, Diabetes, and Insomnia   Hypertension . Pharmacist Clinical Goal(s): o Over the next 3 months, patient will work with PharmD and providers to achieve BP goal <140/90 mmHg . Current regimen:   Amlodipine 2.5 mg - 1 tablet daily  Carvedilol 25 mg - 1 tablet BID  Losartan-HCTZ 100-12.5 mg - 1 tablet daily . Interventions: o Reviewed patient adherence and tolerance of new medication, amlodipine - She is doing well. . Patient self care activities - Over the next 3 months, patient will: o Contact clinic if signs of low blood pressure including dizziness or lightheadedness, feeling faint, or falling o Ensure daily salt intake < 2300 mg/day  Hyperlipidemia . Pharmacist Clinical Goal(s): o Over the next 3 months, patient will work with PharmD and providers to maintain LDL goal < 70 and achieve HDL goal > 50, triglycerides < 150 . Current regimen:  o Atorvastatin 80 mg - 1 tablet daily . Interventions: o Recommend weight bearing exercise as able and limiting carbohydrates to lower triglycerides.  . Patient self care activities - Over the next 3 months, patient will: o Try increasing exercises by 10 minute intervals as able with activities such as brisk walking or chair exercises.  Diabetes . Pharmacist Clinical Goal(s): o Over the next 3 months, patient will work with PharmD and providers to achieve A1c goal <7% . Current regimen:  o Metformin 500 mg - 2 tablets once  daily . Interventions: o Recommend increasing metformin to 2 tablets twice daily as prescribed by Dr. Cruzita Lederer . Patient self care activities - Over the next 3 months, patient will: o Check blood sugar twice daily (Before first meal of the day and 2 hours after), document, and provide at future appointments o Contact provider with any episodes of hypoglycemia  Insomnia . Pharmacist Clinical Goal(s) o Over the next 3 months, patient will work with PharmD and providers to improve time to sleep onset  . Current regimen:  o Nortriptyline 50 mg - 3 capsules at bedtime . Interventions: o Recommend melatonin 5 mg - 1 tablet 30 minutes before bedtime . Patient self care activities - Over the next 3 months, patient will: o Try melatonin 5 mg at bedtime every night for 3-4 weeks. It can take some time to notice a difference. o Avoid caffeine after 3 PM  Initial goal documentation      Cynthia Singh was given information about Chronic Care Management services today including:  1. CCM service includes personalized support from designated clinical staff supervised by her physician, including individualized plan of care and coordination with other care providers 2. 24/7 contact phone numbers for assistance for urgent and routine care needs. 3. Standard insurance, coinsurance, copays and deductibles apply for chronic care management only during months in which we provide at least 20 minutes of these services. Most insurances cover these services at 100%, however patients may be responsible for any copay, coinsurance and/or deductible  if applicable. This service may help you avoid the need for more expensive face-to-face services. 4. Only one practitioner may furnish and bill the service in a calendar month. 5. The patient may stop CCM services at any time (effective at the end of the month) by phone call to the office staff.  Patient agreed to services and verbal consent obtained.   The patient verbalized  understanding of instructions provided today and agreed to receive a mailed copy of patient instruction and/or educational materials. Telephone follow up appointment with pharmacy team member scheduled for:  10/25/19 at 1:30 PM (telephone) for blood glucose log  Cynthia Singh, PharmD Clinical Pharmacist [facility] at [facility] [phone]  Diabetes Mellitus and Nutrition, Adult When you have diabetes (diabetes mellitus), it is very important to have healthy eating habits because your blood sugar (glucose) levels are greatly affected by what you eat and drink. Eating healthy foods in the appropriate amounts, at about the same times every day, can help you:  Control your blood glucose.  Lower your risk of heart disease.  Improve your blood pressure.  Reach or maintain a healthy weight. Every person with diabetes is different, and each person has different needs for a meal plan. Your health care provider may recommend that you work with a diet and nutrition specialist (dietitian) to make a meal plan that is best for you. Your meal plan may vary depending on factors such as:  The calories you need.  The medicines you take.  Your weight.  Your blood glucose, blood pressure, and cholesterol levels.  Your activity level.  Other health conditions you have, such as heart or kidney disease. How do carbohydrates affect me? Carbohydrates, also called carbs, affect your blood glucose level more than any other type of food. Eating carbs naturally raises the amount of glucose in your blood. Carb counting is a method for keeping track of how many carbs you eat. Counting carbs is important to keep your blood glucose at a healthy level, especially if you use insulin or take certain oral diabetes medicines. It is important to know how many carbs you can safely have in each meal. This is different for every person. Your dietitian can help you calculate how many carbs you should have at each  meal and for each snack. Foods that contain carbs include:  Bread, cereal, rice, pasta, and crackers.  Potatoes and corn.  Peas, beans, and lentils.  Milk and yogurt.  Fruit and juice.  Desserts, such as cakes, cookies, ice cream, and candy. How does alcohol affect me? Alcohol can cause a sudden decrease in blood glucose (hypoglycemia), especially if you use insulin or take certain oral diabetes medicines. Hypoglycemia can be a life-threatening condition. Symptoms of hypoglycemia (sleepiness, dizziness, and confusion) are similar to symptoms of having too much alcohol. If your health care provider says that alcohol is safe for you, follow these guidelines:  Limit alcohol intake to no more than 1 drink per day for nonpregnant women and 2 drinks per day for men. One drink equals 12 oz of beer, 5 oz of wine, or 1 oz of hard liquor.  Do not drink on an empty stomach.  Keep yourself hydrated with water, diet soda, or unsweetened iced tea.  Keep in mind that regular soda, juice, and other mixers may contain a lot of sugar and must be counted as carbs. What are tips for following this plan?  Reading food labels  Start by checking the serving size on  the "Nutrition Facts" label of packaged foods and drinks. The amount of calories, carbs, fats, and other nutrients listed on the label is based on one serving of the item. Many items contain more than one serving per package.  Check the total grams (g) of carbs in one serving. You can calculate the number of servings of carbs in one serving by dividing the total carbs by 15. For example, if a food has 30 g of total carbs, it would be equal to 2 servings of carbs.  Check the number of grams (g) of saturated and trans fats in one serving. Choose foods that have low or no amount of these fats.  Check the number of milligrams (mg) of salt (sodium) in one serving. Most people should limit total sodium intake to less than 2,300 mg per  day.  Always check the nutrition information of foods labeled as "low-fat" or "nonfat". These foods may be higher in added sugar or refined carbs and should be avoided.  Talk to your dietitian to identify your daily goals for nutrients listed on the label. Shopping  Avoid buying canned, premade, or processed foods. These foods tend to be high in fat, sodium, and added sugar.  Shop around the outside edge of the grocery store. This includes fresh fruits and vegetables, bulk grains, fresh meats, and fresh dairy. Cooking  Use low-heat cooking methods, such as baking, instead of high-heat cooking methods like deep frying.  Cook using healthy oils, such as olive, canola, or sunflower oil.  Avoid cooking with butter, cream, or high-fat meats. Meal planning  Eat meals and snacks regularly, preferably at the same times every day. Avoid going long periods of time without eating.  Eat foods high in fiber, such as fresh fruits, vegetables, beans, and whole grains. Talk to your dietitian about how many servings of carbs you can eat at each meal.  Eat 4-6 ounces (oz) of lean protein each day, such as lean meat, chicken, fish, eggs, or tofu. One oz of lean protein is equal to: ? 1 oz of meat, chicken, or fish. ? 1 egg. ?  cup of tofu.  Eat some foods each day that contain healthy fats, such as avocado, nuts, seeds, and fish. Lifestyle  Check your blood glucose regularly.  Exercise regularly as told by your health care provider. This may include: ? 150 minutes of moderate-intensity or vigorous-intensity exercise each week. This could be brisk walking, biking, or water aerobics. ? Stretching and doing strength exercises, such as yoga or weightlifting, at least 2 times a week.  Take medicines as told by your health care provider.  Do not use any products that contain nicotine or tobacco, such as cigarettes and e-cigarettes. If you need help quitting, ask your health care provider.  Work with  a Social worker or diabetes educator to identify strategies to manage stress and any emotional and social challenges. Questions to ask a health care provider  Do I need to meet with a diabetes educator?  Do I need to meet with a dietitian?  What number can I call if I have questions?  When are the best times to check my blood glucose? Where to find more information:  American Diabetes Association: diabetes.org  Academy of Nutrition and Dietetics: www.eatright.[employer] of Diabetes and Digestive and Kidney Diseases (NIH): [employer] Summary  A healthy meal plan will help you control your blood glucose and maintain a healthy lifestyle.  Working with a diet and nutrition specialist (dietitian) can  help you make a meal plan that is best for you.  Keep in mind that carbohydrates (carbs) and alcohol have immediate effects on your blood glucose levels. It is important to count carbs and to use alcohol carefully. This information is not intended to replace advice given to you by your health care provider. Make sure you discuss any questions you have with your health care provider. Document Revised: 04/16/2017 Document Reviewed: 06/08/2016 Elsevier Patient Education  2020 [employer].

## 2019-10-10 NOTE — Chronic Care Management (AMB) (Signed)
Chronic Care Management Pharmacy  Name: Cynthia Singh  MRN: [id number] DOB: February 15, 1941  Chief Complaint/ HPI  Cynthia Singh,  79 y.o., female presents for their Initial CCM visit with the clinical pharmacist via telephone.  PCP : Cynthia Carbon, MD  Their chronic conditions include: hypertension, hyperlipidemia, CAD, unstable angina, PVD, type 2 DM, mood disorder, chronic back pain  Patient concerns: occasionally forgets morning medications and unsure if she should take in the afternoon when she remembers --> yes if its a once daily medication, go ahead and take the medication the same day   Office Visits:  09/28/19: Cynthia Singh - hospital f/u, call Dr. Cruzita Lederer to re-establish DM care, improve healthy eating  Consult Visit:  09/29/19: Cardiology - CAD, s/p recent NSTEMI and PCI - add amlodipine 2.5 mg daily and antianginal therapy, continue carvedilol 25 mg BID and DAPT  09/19/19: ED - NSTEMI  01/12/19: Endocrinology - increase Jardiance to 25 mg daily   No Known Allergies  Medications: Outpatient Encounter Medications as of 10/10/2019  Medication Sig  . amLODipine (NORVASC) 2.5 MG tablet Take 1 tablet (2.5 mg total) by mouth daily.  Marland Kitchen aspirin EC 81 MG tablet Take 81 mg by mouth daily.  Marland Kitchen atorvastatin (LIPITOR) 80 MG tablet TAKE 1/2 TABLETS (40 MG TOTAL) BY MOUTH DAILY.  . carvedilol (COREG) 25 MG tablet Take 1 tablet (25 mg total) by mouth 2 (two) times daily with a meal.  . clopidogrel (PLAVIX) 75 MG tablet TAKE 1 TABLET BY MOUTH EVERY DAY  . Cranberry 500 MG CAPS Take 1,000 mg by mouth daily.  . Insulin Glargine (LANTUS SOLOSTAR) 100 UNIT/ML Solostar Pen TAKE 36 UNITS DAILY UNDER SKIN  . Insulin Pen Needle (PEN NEEDLES) 32G X 5 MM MISC 1 Units by Does not apply route daily.  Marland Kitchen losartan-hydrochlorothiazide (HYZAAR) 100-12.5 MG tablet Take 1 tablet by mouth daily.  . metFORMIN (GLUCOPHAGE-XR) 500 MG 24 hr tablet TAKE 2 TABLETS BY MOUTH EVERY DAY WITH BREAKFAST  .  nitroGLYCERIN (NITROSTAT) 0.4 MG SL tablet Place 1 tablet (0.4 mg total) under the tongue every 5 (five) minutes as needed for chest pain. (Patient taking differently: Place 0.4 mg under the tongue daily. For arm/back pain)  . nortriptyline (PAMELOR) 50 MG capsule TAKE 3 CAPSULES (150 MG TOTAL) BY MOUTH AT BEDTIME.  Marland Kitchen Omega-3 Fatty Acids (FISH OIL PO) Take 1 capsule by mouth daily.  Marland Kitchen oxyCODONE-acetaminophen (PERCOCET) 10-325 MG tablet Take 1-2 tablets by mouth every 6 (six) hours as needed for pain. M54.9  . polyethylene glycol (MIRALAX / GLYCOLAX) packet Take 17 g by mouth daily as needed.   . empagliflozin (JARDIANCE) 25 MG TABS tablet Take 25 mg by mouth daily before breakfast. (Patient not taking: Reported on 10/10/2019)  . [DISCONTINUED] oxyCODONE-acetaminophen (PERCOCET) 10-325 MG tablet Take 1-2 tablets by mouth every 6 (six) hours as needed for pain. M54.9   No facility-administered encounter medications on file as of 10/10/2019.   Current Diagnosis/Assessment:   Emergency planning/management officer Strain: Medium Risk  . Difficulty of Paying Living Expenses: Somewhat hard   Goals Addressed            This Visit's Progress   . Pharmacy Care Plan       CARE PLAN ENTRY  Current Barriers:  . Chronic Disease Management support, education, and care coordination needs related to Hypertension, Hyperlipidemia, Diabetes, and Insomnia   Hypertension . Pharmacist Clinical Goal(s): o Over the next 3 months, patient will work with PharmD and providers to  achieve BP goal <140/90 mmHg . Current regimen:   Amlodipine 2.5 mg - 1 tablet daily  Carvedilol 25 mg - 1 tablet BID  Losartan-HCTZ 100-12.5 mg - 1 tablet daily . Interventions: o Reviewed patient adherence and tolerance of new medication, amlodipine - She is doing well. . Patient self care activities - Over the next 3 months, patient will: o Contact clinic if signs of low blood pressure including dizziness or lightheadedness, feeling faint, or  falling o Ensure daily salt intake < 2300 mg/day  Hyperlipidemia . Pharmacist Clinical Goal(s): o Over the next 3 months, patient will work with PharmD and providers to maintain LDL goal < 70 and achieve HDL goal > 50, triglycerides < 150 . Current regimen:  o Atorvastatin 80 mg - 1 tablet daily . Interventions: o Recommend weight bearing exercise as able and limiting carbohydrates to lower triglycerides.  . Patient self care activities - Over the next 3 months, patient will: o Try increasing exercises by 10 minute intervals as able with activities such as brisk walking or chair exercises.  Diabetes . Pharmacist Clinical Goal(s): o Over the next 3 months, patient will work with PharmD and providers to achieve A1c goal <7% . Current regimen:  o Metformin 500 mg - 2 tablets once daily . Interventions: o Recommend increasing metformin to 2 tablets twice daily as prescribed by Dr. Cruzita Lederer . Patient self care activities - Over the next 3 months, patient will: o Check blood sugar twice daily (Before first meal of the day and 2 hours after), document, and provide at future appointments o Contact provider with any episodes of hypoglycemia  Insomnia . Pharmacist Clinical Goal(s) o Over the next 3 months, patient will work with PharmD and providers to improve time to sleep onset  . Current regimen:  o Nortriptyline 50 mg - 3 capsules at bedtime . Interventions: o Recommend melatonin 5 mg - 1 tablet 30 minutes before bedtime . Patient self care activities - Over the next 3 months, patient will: o Try melatonin 5 mg at bedtime every night for 3-4 weeks. It can take some time to notice a difference. o Avoid caffeine after 3 PM  Initial goal documentation      Hypertension   CMP Latest Ref Rng & Units 09/20/2019 09/05/2019 09/04/2019  Glucose 70 - 99 mg/dL 235(H) 224(H) 369(H)  BUN 8 - 23 mg/dL 15 10 10   Creatinine 0.44 - 1.00 mg/dL 0.86 0.72 0.70  Sodium 135 - 145 mmol/L 134(L) 138 137    Potassium 3.5 - 5.1 mmol/L 4.1 4.0 3.9  Chloride 98 - 111 mmol/L 101 102 105  CO2 22 - 32 mmol/L 24 28 24   Calcium 8.9 - 10.3 mg/dL 8.9 9.5 8.6(L)  Total Protein 6.5 - 8.1 g/dL - - 6.4(L)  Total Bilirubin 0.3 - 1.2 mg/dL - - 0.8  Alkaline Phos 38 - 126 U/L - - 74  AST 15 - 41 U/L - - 20  ALT 0 - 44 U/L - - 14   Office blood pressures are: BP Readings from Last 3 Encounters:  09/29/19 (!) 148/84  09/28/19 124/80  09/20/19 124/61   Patient has failed these meds in the past: none reported Patient checks BP at home: does not have home BP monitor Patient home BP readings are ranging: none reported   Patient is currently controlled on the following medications:   Amlodipine 2.5 mg - 1 tablet daily  Carvedilol 25 mg - 1 tablet BID  Losartan-HCTZ 100-12.5 mg -  1 tablet daily  We discussed: Amlodipine started per cardio on 09/29/19 following elevated BP in clinic; Pt confirms taking and denies ADEs with amlodipine. Denies dizziness/lightheadedness, does not own home BP monitor. Instructed to call if any symptoms of hypo/hypertension and will continue to monitor BP in clinic at office visits.   Plan: Continue current medications  Hyperlipidemia   Lipid Panel     Component Value Date/Time   CHOL 158 09/05/2019 0549   TRIG 305 (H) 09/05/2019 0549   HDL 35 (L) 09/05/2019 0549   CHOLHDL 4.5 09/05/2019 0549   VLDL 61 (H) 09/05/2019 0549   LDLCALC 62 09/05/2019 0549   LDLDIRECT 75.0 05/24/2019 1201    CBC Latest Ref Rng & Units 09/20/2019 09/06/2019 09/05/2019  WBC 4.0 - 10.5 K/uL 10.2 12.5(H) 13.5(H)  Hemoglobin 12.0 - 15.0 g/dL 13.2 14.2 13.3  Hematocrit 36.0 - 46.0 % 39.3 41.1 37.7  Platelets 150 - 400 K/uL 226 224 213    LDL goal < 70 (CAD) Patient has failed these meds in past: none reported Patient is currently controlled on the following medications:  . Atorvastatin 80 mg - 1 tablet daily . Aspirin 81 mg - 1 tablet daily  . Plavix 75 mg - 1 tablet daily  . Omega-3 fatty  acids - 1 capsule daily . Nitroglycerin 0.4 mg SL - 1 tablet daily (has helped with shoulder pain)  We discussed: LDL controlled although TG elevated and HDL low. Pt confirms adherence. Recent stent placement - DAPT appropriate. CBC stable.  Plan: Continue current medications; Recommend lifestyle modification to improve HDL. Try increasing exercises by 10 minute intervals as able with activities such as brisk walking or chair exercises.  Diabetes   Recent Relevant Labs: Lab Results  Component Value Date/Time   HGBA1C 11.8 (A) 08/24/2019 02:20 PM   HGBA1C 13.8 (H) 05/24/2019 12:01 PM   HGBA1C 10.7 (A) 01/21/2018 10:30 AM   HGBA1C 11.6 (H) 10/19/2017 10:51 AM    Checking BG: Daily (around 12-1 PM which is 1 hour after eating) Recent 1hr PP BG readings: low 200s Denies hypoglycemia  A1c goal < 7% Patient has failed these meds in past: none reported Patient is currently uncontrolled on the following medications:   Metformin 500 mg ER - 2 tablets daily with breakfast   Lantus - Inject 36 units daily  Jardiance 25 mg - 1 tablet daily (not taking)  Last diabetic eye exam:  Lab Results  Component Value Date/Time   HMDIABEYEEXA Retinopathy (A) 05/23/2018 12:00 AM    Last diabetic foot exam:  Lab Results  Component Value Date/Time   HMDIABFOOTEX done 05/24/2019 12:00 AM    We discussed:   Diet: reports biggest challenge is limiting sweets --> interested in DM education classes  Adherence: Pt is unsure if she is taking Jardiance, no refill history per chart and she does not think she is taking it, unclear reasons; only taking 1000 mg metformin daily rather than 2000 mg per last endocrinology visit   BG monitoring: Recommended checking fasting BG daily and post-prandial 1-2 hours after meal, keep log/documentation   Plan: Patient to discuss medications with endo tomorrow. Recommend increasing metformin to 1000 mg BID and restarting Jardiance. Begin checking BG BID with follow up  call for readings in 2 weeks.    Chronic Pain/Sleep   Patient has failed these meds in past:  Patient is currently controlled on the following medications:   Oxycodone-APAP 10-325 mg - 1-2 tablets q6h PRN pain  Nortriptyline 50  mg - 3 capsules qhs   Bowel regimen: MiraLAX - PRN We discussed: pt reports taking nortriptyline for sleep, it helps but thinks it takes a long time to take effect (takes at 8:30PM - not falling asleep until 2 AM), denies tingling/nerve pain; discussed trying melatonin for sleep  Plan: Continue current medications; Try melatonin 5 mg - 1 tablet daily.   Vaccines   Reviewed and discussed patient's vaccination history.    Immunization History  Administered Date(s) Administered  . Influenza Split 02/15/2012  . Influenza Whole 03/11/2009  . Influenza, Seasonal, Injecte, Preservative Fre 03/25/2016  . Influenza,inj,Quad PF,6+ Mos 03/20/2014, 02/09/2015, 02/23/2017, 01/21/2018, 05/24/2019  . Pneumococcal Conjugate-13 05/30/2013  . Pneumococcal Polysaccharide-23 05/18/2009  . Td 07/16/2008  . Zoster Recombinat (Shingrix) 09/15/2018   Plan: Recommended patient receive Tdap, 2nd dose of Shingrix in 2-6 months.    Medication Management  OTCs: Cranberry 500 mg cap daily - for UTI prevention  Pharmacy/Benefits: [facility]  Adherence: reports missing morning doses  Affordability: Lantus cost  CCM Follow Up:  10/25/19 at 1:30 PM (telephone) for blood glucose log  Debbora Dus, PharmD Clinical Pharmacist [facility] [phone]

## 2019-10-10 NOTE — Telephone Encounter (Signed)
Referral done

## 2019-10-11 ENCOUNTER — Ambulatory Visit (INDEPENDENT_AMBULATORY_CARE_PROVIDER_SITE_OTHER): Payer: Medicare Other | Admitting: Internal Medicine

## 2019-10-11 DIAGNOSIS — Z5329 Procedure and treatment not carried out because of patient's decision for other reasons: Secondary | ICD-10-CM

## 2019-10-11 NOTE — Progress Notes (Signed)
Patient ID: Cynthia Singh, female   DOB: 01-28-1941, 79 y.o.   MRN: [id number]   NO SHOW  HPI: Cynthia Singh is a 79 y.o.-year-old female, initially referred by her PCP, Dr. Silvio Pate, presenting for follow-up for DM2, dx in 1994, insulin-dependent since 2015-16, uncontrolled, with complications (CAD - s/p AMI, PN).  In 06/2019, she returned (virtual visit) after a long absence.  Sugars were previously hyper started to improve before last visit.  Since last visit, she had an NSTEMI on 09/19/2019.  Reviewed HbA1c levels: Lab Results  Component Value Date   HGBA1C 11.8 (A) 08/24/2019   HGBA1C 13.8 (H) 05/24/2019   HGBA1C 10.7 (A) 01/21/2018   HGBA1C 11.6 (H) 10/19/2017   HGBA1C 10.4 (H) 06/23/2017   HGBA1C 10.0 (H) 10/29/2016   HGBA1C 9.1 (H) 07/19/2016   HGBA1C 8.0 (H) 01/10/2016   HGBA1C 8.2 (H) 05/08/2015   HGBA1C 11.1 (H) 02/08/2015   Pt was on a regimen of: - Metformin 500 mg x2 in am -restarted 01/2018  - she was off x 1 year (misunderstanding) - Glipizide 5 mg 2x a day - last at bedtime - Lantus 30 units at bedtime  Currently on: - Metformin ER 1000 mg 2x a day - Jardiance 10 >> 25 mg before breakfast - Lantus 36 units at bedtime  Pt checks her sugars 1-2 times a day: - am: 200-300s >> 150-160 - 2h after b'fast: n/c - before lunch: n/c - 2h after lunch: n/c - before dinner: n/c - 2h after dinner: n/c - bedtime: 300s >> 150-250 - nighttime: n/c Lowest sugar was 210 >> 130; it is unclear at which level she has hypoglycemia awareness. Highest sugar was 400s >> 250.  Glucometer: ReliOn  Pt's meals are: - Breakfast: cereals - rice crispies + regular milk - sweet and low - Lunch: sandwich - Dinner: meat + 2 veggies - Snacks: 1  - pm - pecan pie  -No CKD, last BUN/creatinine:  Lab Results  Component Value Date   BUN 15 09/20/2019   BUN 10 09/05/2019   CREATININE 0.86 09/20/2019   CREATININE 0.72 09/05/2019  On losartan 100.  -+ HL; last set of lipids: Lab  Results  Component Value Date   CHOL 158 09/05/2019   HDL 35 (L) 09/05/2019   LDLCALC 62 09/05/2019   LDLDIRECT 75.0 05/24/2019   TRIG 305 (H) 09/05/2019   CHOLHDL 4.5 09/05/2019  On Lipitor 40, omega-3 fatty acids.  - last eye exam was in 10/2018: Reportedly no DR.  However, in 05/2018, she did have retinopathy.  She had cataract sx -still blurry vision.   -She denies numbness and tingling in her feet.  She is on nortriptyline for peripheral neuropathy.   ASA 81.  + FH of DM in daughter (who died) - she is adopted.   She also has a history of HTN, GERD.  ROS: Constitutional: no weight gain/no weight loss, no fatigue, no subjective hyperthermia, no subjective hypothermia Eyes: no blurry vision, no xerophthalmia ENT: no sore throat, no nodules palpated in neck, no dysphagia, no odynophagia, no hoarseness Cardiovascular: no CP/no SOB/no palpitations/no leg swelling Respiratory: no cough/no SOB/no wheezing Gastrointestinal: no N/no V/no D/no C/no acid reflux Musculoskeletal: no muscle aches/no joint aches Skin: no rashes, no hair loss Neurological: no tremors/no numbness/no tingling/no dizziness  I reviewed pt's medications, allergies, PMH, social hx, family hx, and changes were documented in the history of present illness. Otherwise, unchanged from my initial visit note.  Past Medical History:  Diagnosis Date  . CAD (coronary artery disease)    a. Remote nonobstructive disease but in 10/2012 Cath/PCI: s/p DES to RCA. b. cath 02/08/15 DES to prox LAD and balloon angioplasty of ost D1, EF 55-65%; c. 08/2019 PCI/DES prox/distal RCA (overlapping prev RCA stent). Prev placed RCA/LAD stents patent.  . Concussion    [place] AFTER FALL 3/18  . Depression   . Diabetes mellitus, type 2 ([place])   . Dyslipidemia   . Episodic mood disorder ([place])   . Hypertension   . Myocardial infarction ([place])    2014  . Obesity   . Osteoarthritis of spine    knees also  . Peripheral vascular disease,  unspecified ([place]) 2020   severe disease on home screening by insurance   Past Surgical History:  Procedure Laterality Date  . ABDOMINAL HYSTERECTOMY    . APPENDECTOMY  1959  . CARDIAC CATHETERIZATION N/A 02/08/2015   Procedure: Left Heart Cath and Coronary Angiography;  Surgeon: Sherren Mocha, MD;  Location: [facility];  Service: Cardiovascular;  Laterality: N/A;  . CARDIAC CATHETERIZATION N/A 02/08/2015   Procedure: Coronary Stent Intervention;  Surgeon: Sherren Mocha, MD;  Location: [place] [facility];  Service: Cardiovascular;  Laterality: N/A;  . CARDIAC CATHETERIZATION N/A 02/08/2015   Procedure: Intravascular Pressure Wire/FFR Study;  Surgeon: Sherren Mocha, MD;  Location: [place];  Service: Cardiovascular;  Laterality: N/A;  . CATARACT EXTRACTION W/PHACO Left 11/10/2016   Procedure: CATARACT EXTRACTION PHACO AND INTRAOCULAR LENS PLACEMENT ([place]) suture placed in left eye at end of procedure;  Surgeon: Birder Robson, MD;  Location: [facility];  Service: Ophthalmology;  Laterality: Left;  [place]  3.20 AP% 27.7 CDE 55.63 Fluid pack lot # [id number]  . CHOLECYSTECTOMY    . COMBINED HYSTERECTOMY ABDOMINAL W/ A&P REPAIR / OOPHORECTOMY  1968  . CORONARY ANGIOPLASTY     STENTS X 5  . CORONARY STENT INTERVENTION N/A 09/19/2019   Procedure: CORONARY STENT INTERVENTION;  Surgeon: Nelva Bush, MD;  Location: [place];  Service: Cardiovascular;  Laterality: N/A;  . CORONARY STENT PLACEMENT  2014  . DILATION AND CURETTAGE OF UTERUS    . [place]  . LEFT HEART CATH AND CORONARY ANGIOGRAPHY Left 09/19/2019   Procedure: LEFT HEART CATH AND CORONARY ANGIOGRAPHY;  Surgeon: Nelva Bush, MD;  Location: [place] [facility];  Service: Cardiovascular;  Laterality: Left;  . LEFT HEART CATHETERIZATION WITH CORONARY ANGIOGRAM N/A 10/20/2012   Procedure: LEFT HEART CATHETERIZATION WITH CORONARY ANGIOGRAM;  Surgeon: Sherren Mocha, MD;  Location: [facility];   Service: Cardiovascular;  Laterality: N/A;  . multiple D&C    . TONSILLECTOMY  age 77   Social History   Socioeconomic History  . Marital status: Widowed    Spouse name: Not on file  . Number of children: 3  . Years of education: 55  . Highest education level: Not on file  Occupational History  . Occupation: Scientist, water quality  Tobacco Use  . Smoking status: Never Smoker  . Smokeless tobacco: Never Used  Substance and Sexual Activity  . Alcohol use: No  . Drug use: No  . Sexual activity: Never    Birth control/protection: Post-menopausal  Other Topics Concern  . Not on file  Social History Narrative   Widowed '13. 2 sons- '63, '66; one daughter-'69;    24 grandchildren--has raised 9 of her grandchildren 4 great grandchildren.   Lives with 2 granddaughters and adoptive son      Has  living will   Probably would have son Ivor Panda as health care POA--not set up   Would accept resuscitation but no prolonged ventilation   Not sure about tube feeds   Social Determinants of Health   Financial Resource Strain: Medium Risk  . Difficulty of Paying Living Expenses: Somewhat hard  Food Insecurity:   . Worried About Charity fundraiser in the Last Year:   . Arboriculturist in the Last Year:   Transportation Needs:   . Film/video editor (Medical):   Marland Kitchen Lack of Transportation (Non-Medical):   Physical Activity:   . Days of Exercise per Week:   . Minutes of Exercise per Session:   Stress:   . Feeling of Stress :   Social Connections:   . Frequency of Communication with Friends and Family:   . Frequency of Social Gatherings with Friends and Family:   . Attends Religious Services:   . Active Member of Clubs or Organizations:   . Attends Archivist Meetings:   Marland Kitchen Marital Status:   Intimate Partner Violence:   . Fear of Current or Ex-Partner:   . Emotionally Abused:   Marland Kitchen Physically Abused:   . Sexually Abused:    Current Outpatient Medications on File Prior to Visit   Medication Sig Dispense Refill  . amLODipine (NORVASC) 2.5 MG tablet Take 1 tablet (2.5 mg total) by mouth daily. 90 tablet 3  . aspirin EC 81 MG tablet Take 81 mg by mouth daily.    Marland Kitchen atorvastatin (LIPITOR) 80 MG tablet TAKE 1/2 TABLETS (40 MG TOTAL) BY MOUTH DAILY. 45 tablet 3  . carvedilol (COREG) 25 MG tablet Take 1 tablet (25 mg total) by mouth 2 (two) times daily with a meal. 60 tablet 6  . clopidogrel (PLAVIX) 75 MG tablet TAKE 1 TABLET BY MOUTH EVERY DAY 90 tablet 3  . Cranberry 500 MG CAPS Take 1,000 mg by mouth daily.    . empagliflozin (JARDIANCE) 25 MG TABS tablet Take 25 mg by mouth daily before breakfast. (Patient not taking: Reported on 10/10/2019) 30 tablet 5  . Insulin Glargine (LANTUS SOLOSTAR) 100 UNIT/ML Solostar Pen TAKE 36 UNITS DAILY UNDER SKIN 15 mL 11  . Insulin Pen Needle (PEN NEEDLES) 32G X 5 MM MISC 1 Units by Does not apply route daily. 100 each 3  . losartan-hydrochlorothiazide (HYZAAR) 100-12.5 MG tablet Take 1 tablet by mouth daily. 90 tablet 3  . metFORMIN (GLUCOPHAGE-XR) 500 MG 24 hr tablet TAKE 2 TABLETS BY MOUTH EVERY DAY WITH BREAKFAST 180 tablet 3  . nitroGLYCERIN (NITROSTAT) 0.4 MG SL tablet Place 1 tablet (0.4 mg total) under the tongue every 5 (five) minutes as needed for chest pain. (Patient taking differently: Place 0.4 mg under the tongue daily. For arm/back pain) 30 tablet 0  . nortriptyline (PAMELOR) 50 MG capsule TAKE 3 CAPSULES (150 MG TOTAL) BY MOUTH AT BEDTIME. 270 capsule 3  . Omega-3 Fatty Acids (FISH OIL PO) Take 1 capsule by mouth daily.    Marland Kitchen oxyCODONE-acetaminophen (PERCOCET) 10-325 MG tablet Take 1-2 tablets by mouth every 6 (six) hours as needed for pain. M54.9 150 tablet 0  . polyethylene glycol (MIRALAX / GLYCOLAX) packet Take 17 g by mouth daily as needed.      No current facility-administered medications on file prior to visit.   No Known Allergies Family History  Adopted: Yes  Problem Relation Age of Onset  . Cancer Brother  Colon  . Coronary artery disease Neg Hx   . Heart attack Neg Hx     PE: There were no vitals taken for this visit. Wt Readings from Last 3 Encounters:  09/29/19 187 lb 4 oz (84.9 kg)  09/28/19 186 lb (84.4 kg)  09/20/19 184 lb 14.4 oz (83.9 kg)   Constitutional: overweight, in NAD Eyes: PERRLA, EOMI, no exophthalmos ENT: moist mucous membranes, no thyromegaly, no cervical lymphadenopathy Cardiovascular: RRR, No MRG Respiratory: CTA B Gastrointestinal: abdomen soft, NT, ND, BS+ Musculoskeletal: no deformities, strength intact in all 4 Skin: moist, warm, no rashes Neurological: no tremor with outstretched hands, DTR normal in all 4  ASSESSMENT: 1. DM2, insulin-dependent, uncontrolled, wit long-term complications - CAD, s/p AMI 2014, s/p stents 2014 in 2016, and balloon angioplasty 2016 - PN  2. HL  3. Overweight  PLAN:  1. Patient with   longstanding, uncontrolled, type 2 diabetes, on oral antidiabetic regimen and long-acting insulin.  At last visit, we stopped glipizide and started Jardiance.  We also discussed about switching to a better breakfast (she was eating cereals and I advised her to eat more solid food, with less carbs).  We also increased her basal insulin then. -At this visit, sugars are much better and she tolerates Jardiance well.  Since she still has high blood sugars at night, we discussed about reducing snacking or moving dinner earlier but we will also try to increase Jardiance further. -She has an appointment with PCP coming up next month and I am hoping that she can get an HbA1c checked at that time.  - I suggested to:  Patient Instructions  Please continue: - Lantus 36 units at bedtime - Metformin ER 1000 mg 2x a day - Jardiance 25 mg before breakfast  Please return in 3 months with your sugar log.   - we checked her HbA1c: 7%  - advised to check sugars at different times of the day - 1x a day, rotating check times - advised for yearly eye exams  >> she is UTD - return to clinic in 3-4 months  2. HL -Reviewed latest lipid panel from 2021: LDL at goal, triglycerides high, HDL low: Lab Results  Component Value Date   CHOL 158 09/05/2019   HDL 35 (L) 09/05/2019   LDLCALC 62 09/05/2019   LDLDIRECT 75.0 05/24/2019   TRIG 305 (H) 09/05/2019   CHOLHDL 4.5 09/05/2019  -Continues Lipitor and omega-3 fatty acids without side effects  3.  Overweight -We will continue Jardiance which should also help with weight loss  Philemon Kingdom, MD PhD [facility]

## 2019-10-13 ENCOUNTER — Encounter: Payer: Self-pay | Admitting: Internal Medicine

## 2019-10-13 ENCOUNTER — Ambulatory Visit (INDEPENDENT_AMBULATORY_CARE_PROVIDER_SITE_OTHER): Payer: Medicare Other | Admitting: Internal Medicine

## 2019-10-13 DIAGNOSIS — Z0289 Encounter for other administrative examinations: Secondary | ICD-10-CM

## 2019-10-13 DIAGNOSIS — Z5329 Procedure and treatment not carried out because of patient's decision for other reasons: Secondary | ICD-10-CM

## 2019-10-13 NOTE — Patient Instructions (Signed)
Please continue: - Lantus 36 units at bedtime - Metformin ER 1000 mg 2x a day - Jardiance 25 mg before breakfast  Please return in 3 months with your sugar log.

## 2019-10-13 NOTE — Progress Notes (Signed)
Patient ID: Cynthia Singh, female   DOB: [birth year], 79 y.o.   MRN: [id number]   NO SHOW  HPI: IVERNA Singh is a 79 y.o.-year-old female, initially referred by her PCP, Dr. Silvio Pate, presenting for follow-up for DM2, dx in 1994, insulin-dependent since 2015-16, uncontrolled, with complications (CAD - s/p AMI, PN).  In 06/2019, she returned (virtual visit) after a long absence.  Sugars were previously hyper started to improve before last visit.  Since last visit, she had an NSTEMI on 09/19/2019.  Reviewed HbA1c levels: Lab Results  Component Value Date   HGBA1C 11.8 (A) 08/24/2019   HGBA1C 13.8 (H) 05/24/2019   HGBA1C 10.7 (A) 01/21/2018   HGBA1C 11.6 (H) 10/19/2017   HGBA1C 10.4 (H) 06/23/2017   HGBA1C 10.0 (H) 10/29/2016   HGBA1C 9.1 (H) 07/19/2016   HGBA1C 8.0 (H) 01/10/2016   HGBA1C 8.2 (H) 05/08/2015   HGBA1C 11.1 (H) 02/08/2015   Pt was on a regimen of: - Metformin 500 mg x2 in am -restarted 01/2018  - she was off x 1 year (misunderstanding) - Glipizide 5 mg 2x a day - last at bedtime - Lantus 30 units at bedtime  Currently on: - Metformin ER 1000 mg 2x a day - Jardiance 10 >> 25 mg before breakfast - Lantus 36 units at bedtime  Pt checks her sugars 1-2 times a day: - am: 200-300s >> 150-160 - 2h after b'fast: n/c - before lunch: n/c - 2h after lunch: n/c - before dinner: n/c - 2h after dinner: n/c - bedtime: 300s >> 150-250 - nighttime: n/c Lowest sugar was 210 >> 130; it is unclear at which level she has hypoglycemia awareness. Highest sugar was 400s >> 250.  Glucometer: ReliOn  Pt's meals are: - Breakfast: cereals - rice crispies + regular milk - sweet and low - Lunch: sandwich - Dinner: meat + 2 veggies - Snacks: 1  - pm - pecan pie  -No CKD, last BUN/creatinine:  Lab Results  Component Value Date   BUN 15 09/20/2019   BUN 10 09/05/2019   CREATININE 0.86 09/20/2019   CREATININE 0.72 09/05/2019  On losartan 100.  -+ HL; last set of lipids: Lab  Results  Component Value Date   CHOL 158 09/05/2019   HDL 35 (L) 09/05/2019   LDLCALC 62 09/05/2019   LDLDIRECT 75.0 05/24/2019   TRIG 305 (H) 09/05/2019   CHOLHDL 4.5 09/05/2019  On Lipitor 40, omega-3 fatty acids.  - last eye exam was in 10/2018: Reportedly no DR.  However, in 05/2018, she did have retinopathy.  She had cataract sx -still blurry vision.   -She denies numbness and tingling in her feet.  She is on nortriptyline for peripheral neuropathy.   ASA 81.  + FH of DM in daughter (who died) - she is adopted.   She also has a history of HTN, GERD.  ROS: Constitutional: no weight gain/no weight loss, no fatigue, no subjective hyperthermia, no subjective hypothermia Eyes: no blurry vision, no xerophthalmia ENT: no sore throat, no nodules palpated in neck, no dysphagia, no odynophagia, no hoarseness Cardiovascular: no CP/no SOB/no palpitations/no leg swelling Respiratory: no cough/no SOB/no wheezing Gastrointestinal: no N/no V/no D/no C/no acid reflux Musculoskeletal: no muscle aches/no joint aches Skin: no rashes, no hair loss Neurological: no tremors/no numbness/no tingling/no dizziness  I reviewed pt's medications, allergies, PMH, social hx, family hx, and changes were documented in the history of present illness. Otherwise, unchanged from my initial visit note.  Past Medical History:  Diagnosis Date  . CAD (coronary artery disease)    a. Remote nonobstructive disease but in 10/2012 Cath/PCI: s/p DES to RCA. b. cath 02/08/15 DES to prox LAD and balloon angioplasty of ost D1, EF 55-65%; c. 08/2019 PCI/DES prox/distal RCA (overlapping prev RCA stent). Prev placed RCA/LAD stents patent.  . Concussion    [place] AFTER FALL 3/18  . Depression   . Diabetes mellitus, type 2 ([place])   . Dyslipidemia   . Episodic mood disorder ([place])   . Hypertension   . Myocardial infarction ([place])    2014  . Obesity   . Osteoarthritis of spine    knees also  . Peripheral vascular disease,  unspecified ([place]) 2020   severe disease on home screening by insurance   Past Surgical History:  Procedure Laterality Date  . ABDOMINAL HYSTERECTOMY    . APPENDECTOMY  1959  . CARDIAC CATHETERIZATION N/A 02/08/2015   Procedure: Left Heart Cath and Coronary Angiography;  Surgeon: Sherren Mocha, MD;  Location: [place] [facility];  Service: Cardiovascular;  Laterality: N/A;  . CARDIAC CATHETERIZATION N/A 02/08/2015   Procedure: Coronary Stent Intervention;  Surgeon: Sherren Mocha, MD;  Location: [place];  Service: Cardiovascular;  Laterality: N/A;  . CARDIAC CATHETERIZATION N/A 02/08/2015   Procedure: Intravascular Pressure Wire/FFR Study;  Surgeon: Sherren Mocha, MD;  Location: [place] [facility];  Service: Cardiovascular;  Laterality: N/A;  . CATARACT EXTRACTION W/PHACO Left 11/10/2016   Procedure: CATARACT EXTRACTION PHACO AND INTRAOCULAR LENS PLACEMENT ([place]) suture placed in left eye at end of procedure;  Surgeon: Birder Robson, MD;  Location: [facility];  Service: Ophthalmology;  Laterality: Left;  [place]  3.20 AP% 27.7 CDE 55.63 Fluid pack lot # [id number]  . CHOLECYSTECTOMY    . COMBINED HYSTERECTOMY ABDOMINAL W/ A&P REPAIR / OOPHORECTOMY  1968  . CORONARY ANGIOPLASTY     STENTS X 5  . CORONARY STENT INTERVENTION N/A 09/19/2019   Procedure: CORONARY STENT INTERVENTION;  Surgeon: Nelva Bush, MD;  Location: [facility];  Service: Cardiovascular;  Laterality: N/A;  . CORONARY STENT PLACEMENT  2014  . DILATION AND CURETTAGE OF UTERUS    . [place]  . LEFT HEART CATH AND CORONARY ANGIOGRAPHY Left 09/19/2019   Procedure: LEFT HEART CATH AND CORONARY ANGIOGRAPHY;  Surgeon: Nelva Bush, MD;  Location: [place] [facility];  Service: Cardiovascular;  Laterality: Left;  . LEFT HEART CATHETERIZATION WITH CORONARY ANGIOGRAM N/A 10/20/2012   Procedure: LEFT HEART CATHETERIZATION WITH CORONARY ANGIOGRAM;  Surgeon: Sherren Mocha, MD;  Location: [facility];   Service: Cardiovascular;  Laterality: N/A;  . multiple D&C    . TONSILLECTOMY  age 73   Social History   Socioeconomic History  . Marital status: Widowed    Spouse name: Not on file  . Number of children: 3  . Years of education: 49  . Highest education level: Not on file  Occupational History  . Occupation: Scientist, water quality  Tobacco Use  . Smoking status: Never Smoker  . Smokeless tobacco: Never Used  Substance and Sexual Activity  . Alcohol use: No  . Drug use: No  . Sexual activity: Never    Birth control/protection: Post-menopausal  Other Topics Concern  . Not on file  Social History Narrative   Widowed '13. 2 sons- '63, '66; one daughter-'69;    50 grandchildren--has raised 9 of her grandchildren 4 great grandchildren.   Lives with 2 granddaughters and adoptive son      Has  living will   Probably would have son Nikida Dutchover as health care POA--not set up   Would accept resuscitation but no prolonged ventilation   Not sure about tube feeds   Social Determinants of Health   Financial Resource Strain: Medium Risk  . Difficulty of Paying Living Expenses: Somewhat hard  Food Insecurity:   . Worried About Charity fundraiser in the Last Year:   . Arboriculturist in the Last Year:   Transportation Needs:   . Film/video editor (Medical):   Marland Kitchen Lack of Transportation (Non-Medical):   Physical Activity:   . Days of Exercise per Week:   . Minutes of Exercise per Session:   Stress:   . Feeling of Stress :   Social Connections:   . Frequency of Communication with Friends and Family:   . Frequency of Social Gatherings with Friends and Family:   . Attends Religious Services:   . Active Member of Clubs or Organizations:   . Attends Archivist Meetings:   Marland Kitchen Marital Status:   Intimate Partner Violence:   . Fear of Current or Ex-Partner:   . Emotionally Abused:   Marland Kitchen Physically Abused:   . Sexually Abused:    Current Outpatient Medications on File Prior to Visit   Medication Sig Dispense Refill  . amLODipine (NORVASC) 2.5 MG tablet Take 1 tablet (2.5 mg total) by mouth daily. 90 tablet 3  . aspirin EC 81 MG tablet Take 81 mg by mouth daily.    Marland Kitchen atorvastatin (LIPITOR) 80 MG tablet TAKE 1/2 TABLETS (40 MG TOTAL) BY MOUTH DAILY. 45 tablet 3  . carvedilol (COREG) 25 MG tablet Take 1 tablet (25 mg total) by mouth 2 (two) times daily with a meal. 60 tablet 6  . clopidogrel (PLAVIX) 75 MG tablet TAKE 1 TABLET BY MOUTH EVERY DAY 90 tablet 3  . Cranberry 500 MG CAPS Take 1,000 mg by mouth daily.    . empagliflozin (JARDIANCE) 25 MG TABS tablet Take 25 mg by mouth daily before breakfast. (Patient not taking: Reported on 10/10/2019) 30 tablet 5  . Insulin Glargine (LANTUS SOLOSTAR) 100 UNIT/ML Solostar Pen TAKE 36 UNITS DAILY UNDER SKIN 15 mL 11  . Insulin Pen Needle (PEN NEEDLES) 32G X 5 MM MISC 1 Units by Does not apply route daily. 100 each 3  . losartan-hydrochlorothiazide (HYZAAR) 100-12.5 MG tablet Take 1 tablet by mouth daily. 90 tablet 3  . metFORMIN (GLUCOPHAGE-XR) 500 MG 24 hr tablet TAKE 2 TABLETS BY MOUTH EVERY DAY WITH BREAKFAST 180 tablet 3  . nitroGLYCERIN (NITROSTAT) 0.4 MG SL tablet Place 1 tablet (0.4 mg total) under the tongue every 5 (five) minutes as needed for chest pain. (Patient taking differently: Place 0.4 mg under the tongue daily. For arm/back pain) 30 tablet 0  . nortriptyline (PAMELOR) 50 MG capsule TAKE 3 CAPSULES (150 MG TOTAL) BY MOUTH AT BEDTIME. 270 capsule 3  . Omega-3 Fatty Acids (FISH OIL PO) Take 1 capsule by mouth daily.    Marland Kitchen oxyCODONE-acetaminophen (PERCOCET) 10-325 MG tablet Take 1-2 tablets by mouth every 6 (six) hours as needed for pain. M54.9 150 tablet 0  . polyethylene glycol (MIRALAX / GLYCOLAX) packet Take 17 g by mouth daily as needed.      No current facility-administered medications on file prior to visit.   No Known Allergies Family History  Adopted: Yes  Problem Relation Age of Onset  . Cancer Brother  Colon  . Coronary artery disease Neg Hx   . Heart attack Neg Hx     PE: There were no vitals taken for this visit. Wt Readings from Last 3 Encounters:  09/29/19 187 lb 4 oz (84.9 kg)  09/28/19 186 lb (84.4 kg)  09/20/19 184 lb 14.4 oz (83.9 kg)   Constitutional: overweight, in NAD Eyes: PERRLA, EOMI, no exophthalmos ENT: moist mucous membranes, no thyromegaly, no cervical lymphadenopathy Cardiovascular: RRR, No MRG Respiratory: CTA B Gastrointestinal: abdomen soft, NT, ND, BS+ Musculoskeletal: no deformities, strength intact in all 4 Skin: moist, warm, no rashes Neurological: no tremor with outstretched hands, DTR normal in all 4  ASSESSMENT: 1. DM2, insulin-dependent, uncontrolled, wit long-term complications - CAD, s/p AMI 2014, s/p stents 2014 in 2016, and balloon angioplasty 2016 - PN  2. HL  3. Overweight  PLAN:  1. Patient with longstanding, uncontrolled, type 2 diabetes, on oral antidiabetic regimen with Metformin SGLT2 inhibitor and also long-acting insulin.  In the last year, we stop glipizide and started [place].  We also discussed about switching to a better breakfast (she was eating cereals and I advised her to eat more solid food, with less carbs).  We also increased her basal insulin.  At last visit, sugars were much better and she tolerated Jardiance well.  Since she had high blood sugars at night, we discussed about reducing snacking, and eating dinner earlier, but we also tried to increase Jardiance further. -At this visit, - I suggested to:  Patient Instructions  Please continue: - Lantus 36 units at bedtime - Metformin ER 1000 mg 2x a day - Jardiance 25 mg before breakfast  Please return in 3 months with your sugar log.   - we checked her HbA1c: 7%  - advised to check sugars at different times of the day - 1x a day, rotating check times - advised for yearly eye exams >> she is UTD - return to clinic in 3-4 months  2. HL -Reviewed latest lipid  panel from 2021: LDL at goal, triglycerides high, HDL low: Lab Results  Component Value Date   CHOL 158 09/05/2019   HDL 35 (L) 09/05/2019   LDLCALC 62 09/05/2019   LDLDIRECT 75.0 05/24/2019   TRIG 305 (H) 09/05/2019   CHOLHDL 4.5 09/05/2019  -Continues Lipitor and omega-3 fatty acids without side effects  3.  Overweight -We will continue Jardiance which should also help with weight loss -  Philemon Kingdom, MD PhD [facility]

## 2019-10-17 ENCOUNTER — Ambulatory Visit: Payer: Medicare Other

## 2019-10-25 ENCOUNTER — Ambulatory Visit: Payer: Medicare Other

## 2019-10-25 ENCOUNTER — Other Ambulatory Visit: Payer: Self-pay

## 2019-10-25 DIAGNOSIS — I10 Essential (primary) hypertension: Secondary | ICD-10-CM

## 2019-10-25 DIAGNOSIS — E1165 Type 2 diabetes mellitus with hyperglycemia: Secondary | ICD-10-CM

## 2019-10-25 DIAGNOSIS — E1159 Type 2 diabetes mellitus with other circulatory complications: Secondary | ICD-10-CM

## 2019-10-25 DIAGNOSIS — E785 Hyperlipidemia, unspecified: Secondary | ICD-10-CM

## 2019-10-25 NOTE — Patient Instructions (Addendum)
Dear Cynthia Singh,  Below is a summary of the goals we discussed during our follow up appointment on October 25, 2019. Please contact me anytime with questions or concerns.   Visit Information  Goals Addressed            This Visit's Progress   . Pharmacy Care Plan       CARE PLAN ENTRY  Current Barriers:  . Chronic Disease Management support, education, and care coordination needs related to Hypertension, Hyperlipidemia, Diabetes, and Insomnia   Hypertension . Pharmacist Clinical Goal(s): o Over the next 2 weeks, patient will work with PharmD and providers to achieve BP goal <140/90 mmHg . Current regimen:   Amlodipine 2.5 mg - 1 tablet daily  Carvedilol 25 mg - 1 tablet twice daily  Losartan-HCTZ 100-12.5 mg - 1 tablet daily . Interventions: o Reviewed patient adherence and tolerance of new medication, amlodipine - She is doing well. . Patient self care activities - Over the next 2 weeks, patient will: o Contact clinic if signs of low blood pressure including dizziness or lightheadedness, feeling faint, or falling o Recommend purchasing a home blood pressure monitor to ensure fatigue is not associated with blood pressure or heart rate   Hyperlipidemia . Pharmacist Clinical Goal(s): o Over the next 2 weeks, patient will work with PharmD and providers to maintain LDL goal < 70 and achieve HDL goal > 50, triglycerides < 150 . Current regimen:  o Atorvastatin 80 mg - 1 tablet daily . Interventions: o Recommend weight bearing exercise as able and limiting carbohydrates to lower triglycerides.  . Patient self care activities - Over the next 2 weeks, patient will: o Try increasing exercises by 10 minute intervals as able with activities such as brisk walking or chair exercises.  Diabetes . Pharmacist Clinical Goal(s): o Over the next 2 weeks, patient will work with PharmD and providers to achieve A1c goal <7% . Current regimen:  o Metformin 500 mg - 2 tablets twice daily  with breakfast and supper o Lantus - Inject 36 units every evening  o Jardiance 25 mg - 1 tablet daily . Interventions: o Recommend restarting Jardiance as soon as possible  . Patient self care activities - Over the next 2 weeks, patient will: o Check blood sugar once daily (Before first meal of the day), document, and provide at future appointment o Contact provider with any episodes of hypoglycemia  Insomnia . Pharmacist Clinical Goal(s) o Over the next 2 weeks, patient will work with PharmD and providers to improve time to sleep onset  . Current regimen:  o Nortriptyline 50 mg - 3 capsules at bedtime . Interventions: o Recommend melatonin 5 mg - 1 tablet 30 minutes before bedtime . Patient self care activities - Over the next 3 months, patient will: o Try increasing melatonin to 10 mg 30 minutes before bedtime every night for 3-4 weeks. It can take some time to notice a difference. o Avoid caffeine after 3 PM  Please see past updates related to this goal by clicking on the "Past Updates" button in the selected goal       The patient verbalized understanding of instructions provided today and agreed to receive a mailed copy of patient instruction and/or educational materials.  Telephone follow up appointment with pharmacy team member scheduled for: 11/09/19 at 1:00 PM (telephone) for blood glucose log  Debbora Dus, PharmD Clinical Pharmacist [facility] [phone]   Diabetes Mellitus and Nutrition, Adult When you  have diabetes (diabetes mellitus), it is very important to have healthy eating habits because your blood sugar (glucose) levels are greatly affected by what you eat and drink. Eating healthy foods in the appropriate amounts, at about the same times every day, can help you:  Control your blood glucose.  Lower your risk of heart disease.  Improve your blood pressure.  Reach or maintain a healthy weight. Every person with diabetes is  different, and each person has different needs for a meal plan. Your health care provider may recommend that you work with a diet and nutrition specialist (dietitian) to make a meal plan that is best for you. Your meal plan may vary depending on factors such as:  The calories you need.  The medicines you take.  Your weight.  Your blood glucose, blood pressure, and cholesterol levels.  Your activity level.  Other health conditions you have, such as heart or kidney disease. How do carbohydrates affect me? Carbohydrates, also called carbs, affect your blood glucose level more than any other type of food. Eating carbs naturally raises the amount of glucose in your blood. Carb counting is a method for keeping track of how many carbs you eat. Counting carbs is important to keep your blood glucose at a healthy level, especially if you use insulin or take certain oral diabetes medicines. It is important to know how many carbs you can safely have in each meal. This is different for every person. Your dietitian can help you calculate how many carbs you should have at each meal and for each snack. Foods that contain carbs include:  Bread, cereal, rice, pasta, and crackers.  Potatoes and corn.  Peas, beans, and lentils.  Milk and yogurt.  Fruit and juice.  Desserts, such as cakes, cookies, ice cream, and candy. How does alcohol affect me? Alcohol can cause a sudden decrease in blood glucose (hypoglycemia), especially if you use insulin or take certain oral diabetes medicines. Hypoglycemia can be a life-threatening condition. Symptoms of hypoglycemia (sleepiness, dizziness, and confusion) are similar to symptoms of having too much alcohol. If your health care provider says that alcohol is safe for you, follow these guidelines:  Limit alcohol intake to no more than 1 drink per day for nonpregnant women and 2 drinks per day for men. One drink equals 12 oz of beer, 5 oz of wine, or 1 oz of hard  liquor.  Do not drink on an empty stomach.  Keep yourself hydrated with water, diet soda, or unsweetened iced tea.  Keep in mind that regular soda, juice, and other mixers may contain a lot of sugar and must be counted as carbs. What are tips for following this plan?  Reading food labels  Start by checking the serving size on the "Nutrition Facts" label of packaged foods and drinks. The amount of calories, carbs, fats, and other nutrients listed on the label is based on one serving of the item. Many items contain more than one serving per package.  Check the total grams (g) of carbs in one serving. You can calculate the number of servings of carbs in one serving by dividing the total carbs by 15. For example, if a food has 30 g of total carbs, it would be equal to 2 servings of carbs.  Check the number of grams (g) of saturated and trans fats in one serving. Choose foods that have low or no amount of these fats.  Check the number of milligrams (mg) of salt (sodium) in  one serving. Most people should limit total sodium intake to less than 2,300 mg per day.  Always check the nutrition information of foods labeled as "low-fat" or "nonfat". These foods may be higher in added sugar or refined carbs and should be avoided.  Talk to your dietitian to identify your daily goals for nutrients listed on the label. Shopping  Avoid buying canned, premade, or processed foods. These foods tend to be high in fat, sodium, and added sugar.  Shop around the outside edge of the grocery store. This includes fresh fruits and vegetables, bulk grains, fresh meats, and fresh dairy. Cooking  Use low-heat cooking methods, such as baking, instead of high-heat cooking methods like deep frying.  Cook using healthy oils, such as olive, canola, or sunflower oil.  Avoid cooking with butter, cream, or high-fat meats. Meal planning  Eat meals and snacks regularly, preferably at the same times every day. Avoid going  long periods of time without eating.  Eat foods high in fiber, such as fresh fruits, vegetables, beans, and whole grains. Talk to your dietitian about how many servings of carbs you can eat at each meal.  Eat 4-6 ounces (oz) of lean protein each day, such as lean meat, chicken, fish, eggs, or tofu. One oz of lean protein is equal to: ? 1 oz of meat, chicken, or fish. ? 1 egg. ?  cup of tofu.  Eat some foods each day that contain healthy fats, such as avocado, nuts, seeds, and fish. Lifestyle  Check your blood glucose regularly.  Exercise regularly as told by your health care provider. This may include: ? 150 minutes of moderate-intensity or vigorous-intensity exercise each week. This could be brisk walking, biking, or water aerobics. ? Stretching and doing strength exercises, such as yoga or weightlifting, at least 2 times a week.  Take medicines as told by your health care provider.  Do not use any products that contain nicotine or tobacco, such as cigarettes and e-cigarettes. If you need help quitting, ask your health care provider.  Work with a Social worker or diabetes educator to identify strategies to manage stress and any emotional and social challenges. Questions to ask a health care provider  Do I need to meet with a diabetes educator?  Do I need to meet with a dietitian?  What number can I call if I have questions?  When are the best times to check my blood glucose? Where to find more information:  American Diabetes Association: diabetes.org  Academy of Nutrition and Dietetics: www.eatright.[employer] of Diabetes and Digestive and Kidney Diseases (NIH): [employer] Summary  A healthy meal plan will help you control your blood glucose and maintain a healthy lifestyle.  Working with a diet and nutrition specialist (dietitian) can help you make a meal plan that is best for you.  Keep in mind that carbohydrates (carbs) and alcohol have immediate  effects on your blood glucose levels. It is important to count carbs and to use alcohol carefully. This information is not intended to replace advice given to you by your health care provider. Make sure you discuss any questions you have with your health care provider. Document Revised: 04/16/2017 Document Reviewed: 06/08/2016 Elsevier Patient Education  2020 [employer].

## 2019-10-25 NOTE — Chronic Care Management (AMB) (Signed)
Chronic Care Management Pharmacy  Name: Cynthia Singh  MRN: [id number] DOB: 08/12/1940  Chief Complaint/ HPI  Cynthia Singh,  79 y.o., female presents for their Initial CCM visit with the clinical pharmacist via telephone.  PCP : Venia Carbon, MD  Their chronic conditions include: hypertension, hyperlipidemia, CAD, unstable angina, PVD, type 2 DM, mood disorder, chronic back pain  Patient concerns: fatigue --> discussed impact of poorly controlled DM, importance of exercise  No office visits since last CCM visit, pt missed appt with endocrinology due to her son being sick   No Known Allergies  Medications: Outpatient Encounter Medications as of 10/25/2019  Medication Sig  . amLODipine (NORVASC) 2.5 MG tablet Take 1 tablet (2.5 mg total) by mouth daily.  Marland Kitchen aspirin EC 81 MG tablet Take 81 mg by mouth daily.  Marland Kitchen atorvastatin (LIPITOR) 80 MG tablet TAKE 1/2 TABLETS (40 MG TOTAL) BY MOUTH DAILY.  . carvedilol (COREG) 25 MG tablet Take 1 tablet (25 mg total) by mouth 2 (two) times daily with a meal.  . clopidogrel (PLAVIX) 75 MG tablet TAKE 1 TABLET BY MOUTH EVERY DAY  . Cranberry 500 MG CAPS Take 1,000 mg by mouth daily.  . empagliflozin (JARDIANCE) 25 MG TABS tablet Take 25 mg by mouth daily before breakfast. (Patient not taking: Reported on 10/10/2019)  . Insulin Glargine (LANTUS SOLOSTAR) 100 UNIT/ML Solostar Pen TAKE 36 UNITS DAILY UNDER SKIN  . Insulin Pen Needle (PEN NEEDLES) 32G X 5 MM MISC 1 Units by Does not apply route daily.  Marland Kitchen losartan-hydrochlorothiazide (HYZAAR) 100-12.5 MG tablet Take 1 tablet by mouth daily.  . metFORMIN (GLUCOPHAGE-XR) 500 MG 24 hr tablet TAKE 2 TABLETS BY MOUTH EVERY DAY WITH BREAKFAST  . nitroGLYCERIN (NITROSTAT) 0.4 MG SL tablet Place 1 tablet (0.4 mg total) under the tongue every 5 (five) minutes as needed for chest pain. (Patient taking differently: Place 0.4 mg under the tongue daily. For arm/back pain)  . nortriptyline (PAMELOR) 50 MG  capsule TAKE 3 CAPSULES (150 MG TOTAL) BY MOUTH AT BEDTIME.  Marland Kitchen Omega-3 Fatty Acids (FISH OIL PO) Take 1 capsule by mouth daily.  Marland Kitchen oxyCODONE-acetaminophen (PERCOCET) 10-325 MG tablet Take 1-2 tablets by mouth every 6 (six) hours as needed for pain. M54.9  . polyethylene glycol (MIRALAX / GLYCOLAX) packet Take 17 g by mouth daily as needed.    No facility-administered encounter medications on file as of 10/25/2019.   Current Diagnosis/Assessment:   Emergency planning/management officer Strain: Medium Risk  . Difficulty of Paying Living Expenses: Somewhat hard   Goals Addressed            This Visit's Progress   . Pharmacy Care Plan       CARE PLAN ENTRY  Current Barriers:  . Chronic Disease Management support, education, and care coordination needs related to Hypertension, Hyperlipidemia, Diabetes, and Insomnia   Hypertension . Pharmacist Clinical Goal(s): o Over the next 2 weeks, patient will work with PharmD and providers to achieve BP goal <140/90 mmHg . Current regimen:   Amlodipine 2.5 mg - 1 tablet daily  Carvedilol 25 mg - 1 tablet twice daily  Losartan-HCTZ 100-12.5 mg - 1 tablet daily . Interventions: o Reviewed patient adherence and tolerance of new medication, amlodipine - She is doing well. . Patient self care activities - Over the next 2 weeks, patient will: o Contact clinic if signs of low blood pressure including dizziness or lightheadedness, feeling faint, or falling o Recommend purchasing a home blood pressure monitor  to ensure fatigue is not associated with blood pressure or heart rate   Hyperlipidemia . Pharmacist Clinical Goal(s): o Over the next 2 weeks, patient will work with PharmD and providers to maintain LDL goal < 70 and achieve HDL goal > 50, triglycerides < 150 . Current regimen:  o Atorvastatin 80 mg - 1 tablet daily . Interventions: o Recommend weight bearing exercise as able and limiting carbohydrates to lower triglycerides.  . Patient self care activities -  Over the next 2 weeks, patient will: o Try increasing exercises by 10 minute intervals as able with activities such as brisk walking or chair exercises.  Diabetes . Pharmacist Clinical Goal(s): o Over the next 2 weeks, patient will work with PharmD and providers to achieve A1c goal <7% . Current regimen:  o Metformin 500 mg - 2 tablets twice daily with breakfast and supper o Lantus - Inject 36 units every evening  o Jardiance 25 mg - 1 tablet daily . Interventions: o Recommend restarting Jardiance as soon as possible  . Patient self care activities - Over the next 2 weeks, patient will: o Check blood sugar once daily (Before first meal of the day), document, and provide at future appointment o Contact provider with any episodes of hypoglycemia  Insomnia . Pharmacist Clinical Goal(s) o Over the next 2 weeks, patient will work with PharmD and providers to improve time to sleep onset  . Current regimen:  o Nortriptyline 50 mg - 3 capsules at bedtime . Interventions: o Recommend melatonin 5 mg - 1 tablet 30 minutes before bedtime . Patient self care activities - Over the next 3 months, patient will: o Try increasing melatonin to 10 mg 30 minutes before bedtime every night for 3-4 weeks. It can take some time to notice a difference. o Avoid caffeine after 3 PM  Please see past updates related to this goal by clicking on the "Past Updates" button in the selected goal       Hypertension   CMP Latest Ref Rng & Units 09/20/2019 09/05/2019 09/04/2019  Glucose 70 - 99 mg/dL 235(H) 224(H) 369(H)  BUN 8 - 23 mg/dL 15 10 10   Creatinine 0.44 - 1.00 mg/dL 0.86 0.72 0.70  Sodium 135 - 145 mmol/L 134(L) 138 137  Potassium 3.5 - 5.1 mmol/L 4.1 4.0 3.9  Chloride 98 - 111 mmol/L 101 102 105  CO2 22 - 32 mmol/L 24 28 24   Calcium 8.9 - 10.3 mg/dL 8.9 9.5 8.6(L)  Total Protein 6.5 - 8.1 g/dL - - 6.4(L)  Total Bilirubin 0.3 - 1.2 mg/dL - - 0.8  Alkaline Phos 38 - 126 U/L - - 74  AST 15 - 41 U/L - -  20  ALT 0 - 44 U/L - - 14   Office blood pressures are: BP Readings from Last 3 Encounters:  09/29/19 (!) 148/84  09/28/19 124/80  09/20/19 124/61   Patient has failed these meds in the past: none reported Patient checks BP at home: does not have home BP monitor Patient home BP readings are ranging: none reported   BP goal < 140/90 mmHg Patient is currently controlled on the following medications:   Amlodipine 2.5 mg - 1 tablet daily  Carvedilol 25 mg - 1 tablet BID  Losartan-HCTZ 100-12.5 mg - 1 tablet daily  We discussed: no changes in symptoms since last visit, denies dizziness/lightheadedness but is fatigued thus may be prudent to monitor home BP   Pertinent history: Amlodipine started per cardio on 09/29/19 following elevated  BP in clinic; Pt confirms taking and denies ADEs with amlodipine.   Plan: Continue current medications; Consider purchasing home monitor for BP to ensure BP is controlled since starting amlodipine.   Hyperlipidemia   Lipid Panel     Component Value Date/Time   CHOL 158 09/05/2019 0549   TRIG 305 (H) 09/05/2019 0549   HDL 35 (L) 09/05/2019 0549   CHOLHDL 4.5 09/05/2019 0549   VLDL 61 (H) 09/05/2019 0549   LDLCALC 62 09/05/2019 0549   LDLDIRECT 75.0 05/24/2019 1201    CBC Latest Ref Rng & Units 09/20/2019 09/06/2019 09/05/2019  WBC 4.0 - 10.5 K/uL 10.2 12.5(H) 13.5(H)  Hemoglobin 12.0 - 15.0 g/dL 13.2 14.2 13.3  Hematocrit 36.0 - 46.0 % 39.3 41.1 37.7  Platelets 150 - 400 K/uL 226 224 213    LDL goal < 70 (CAD) Patient has failed these meds in past: none reported Patient is currently controlled on the following medications:  . Atorvastatin 80 mg - 1 tablet daily . Aspirin 81 mg - 1 tablet daily  . Plavix 75 mg - 1 tablet daily  . Omega-3 fatty acids - 1 capsule daily . Nitroglycerin 0.4 mg SL - 1 tablet daily (has helped with shoulder pain)  We discussed: LDL controlled although TG elevated and HDL low. Pt confirms adherence. Recent stent  placement - DAPT appropriate. CBC stable.  Plan: Continue current medications; Recommend lifestyle modification to improve HDL. Try increasing exercises by 10 minute intervals as able with activities such as brisk walking or chair exercises.  Diabetes   Recent Relevant Labs: Lab Results  Component Value Date/Time   HGBA1C 11.8 (A) 08/24/2019 02:20 PM   HGBA1C 13.8 (H) 05/24/2019 12:01 PM   HGBA1C 10.7 (A) 01/21/2018 10:30 AM   HGBA1C 11.6 (H) 10/19/2017 10:51 AM    Checking BG: Daily (around 12-1 PM which is 1 hour after eating) Reports the following readings from the past 2 weeks: 2 days ago - 198, best - 150ish (before breakfast), highest 230 (after meal) Denies hypoglycemia  A1c goal < 7% Patient has failed these meds in past: none reported Patient is currently uncontrolled on the following medications:   Metformin 500 mg ER - 2 tablets daily with breakfast   Lantus - Inject 36 units daily  Jardiance 25 mg - 1 tablet daily (not taking)  Last diabetic eye exam:  Lab Results  Component Value Date/Time   HMDIABEYEEXA Retinopathy (A) 05/23/2018 12:00 AM    Last diabetic foot exam:  Lab Results  Component Value Date/Time   HMDIABFOOTEX done 05/24/2019 12:00 AM    We discussed:  Adherence:   Pt still unsure if she is taking Jardiance, no refill history per chart and she does not think she is taking it --> recommended going to [facility] today to pick up, call if any concerns/cost issues  Pt increased metformin from once daily to twice daily per discussion at last visit, denies ADEs  Confirms insulin 36 units daily   BG monitoring: Discussed importance of checking fasting blood glucose daily - f/u in 2 weeks for BG log  Plan: Pt to pick up Jardiance 25 mg from [facility] today. Call if any difficulty picking up medication (should have refills from 12/2018). Check blood glucose first thing in the morning for next 2 weeks. Discuss DM education classes at follow up.  Chronic Pain/Sleep     Patient has failed these meds in past: none reported Patient is currently controlled on the following medications:   Oxycodone-APAP  10-325 mg - 1-2 tablets q6h PRN pain  Nortriptyline 50 mg - 3 capsules qhs (takes for sleep)  Bowel regimen: MiraLAX - PRN  We discussed: Reports trying melatonin 5 mg for the past 2 weeks with little effect, however, she has been falling asleep a little earlier then previously - 12-1 AM rather than 2 AM. Wondering about higher dose. Continues nortriptyline for sleep. Denies pain concerns.  Plan: Continue current medications; May increase melatonin to 10 mg to see if further benefit.   Vaccines   Reviewed and discussed patient's vaccination history.    Immunization History  Administered Date(s) Administered  . Influenza Split 02/15/2012  . Influenza Whole 03/11/2009  . Influenza, Seasonal, Injecte, Preservative Fre 03/25/2016  . Influenza,inj,Quad PF,6+ Mos 03/20/2014, 02/09/2015, 02/23/2017, 01/21/2018, 05/24/2019  . Pneumococcal Conjugate-13 05/30/2013  . Pneumococcal Polysaccharide-23 05/18/2009  . Td 07/16/2008  . Zoster Recombinat (Shingrix) 09/15/2018   Plan: Recommended patient receive Tdap, 2nd dose of Shingrix in 2-6 months.    Medication Management  OTCs: Cranberry 500 mg cap daily - for UTI prevention  Pharmacy/Benefits: [facility]  Adherence: reports doing better, denies missed doses in past week; declines interest in [facility] today for pharmacy coordination and management   Affordability: Lantus cost  CCM Follow Up:  11/09/19 at 1:00 PM (telephone) for blood glucose log  Debbora Dus, PharmD Clinical Pharmacist [facility] [phone]

## 2019-11-08 ENCOUNTER — Other Ambulatory Visit: Payer: Self-pay

## 2019-11-08 MED ORDER — OXYCODONE-ACETAMINOPHEN 10-325 MG PO TABS: 1.0000 | ORAL_TABLET | Freq: Four times a day (QID) | ORAL | 0 refills | Status: DC | PRN

## 2019-11-08 NOTE — Telephone Encounter (Signed)
Name of Medication: percocet 10-325 mg Name of Pharmacy: [facility] Date and Quantity: # 150 on 10/09/19 Last Office Visit and Type:09/28/19 HFU  Next Office Visit and Type:11/23/19 for 3 mth FU Last Controlled Substance Agreement Date:08/29/19  Last UDS:08/24/19

## 2019-11-09 ENCOUNTER — Telehealth: Payer: Medicare Other

## 2019-11-09 NOTE — Chronic Care Management (AMB) (Deleted)
Chronic Care Management Pharmacy  Name: Cynthia Singh  MRN: [id number] DOB: [birth year]  Chief Complaint/ HPI  Burley Saver,  79 y.o., female presents for their Follow-Up CCM visit with the clinical pharmacist via telephone.  PCP : Venia Carbon, MD  Their chronic conditions include: hypertension, hyperlipidemia, CAD, unstable angina, PVD, type 2 DM, mood disorder, chronic back pain  Patient concerns: fatigue --> discussed impact of poorly controlled DM, importance of exercise  No office visits since last CCM visit, pt missed appt with endocrinology due to her son being sick   No Known Allergies  Medications: Outpatient Encounter Medications as of 11/09/2019  Medication Sig  . amLODipine (NORVASC) 2.5 MG tablet Take 1 tablet (2.5 mg total) by mouth daily.  Marland Kitchen aspirin EC 81 MG tablet Take 81 mg by mouth daily.  Marland Kitchen atorvastatin (LIPITOR) 80 MG tablet TAKE 1/2 TABLETS (40 MG TOTAL) BY MOUTH DAILY.  . carvedilol (COREG) 25 MG tablet Take 1 tablet (25 mg total) by mouth 2 (two) times daily with a meal.  . clopidogrel (PLAVIX) 75 MG tablet TAKE 1 TABLET BY MOUTH EVERY DAY  . Cranberry 500 MG CAPS Take 1,000 mg by mouth daily.  . empagliflozin (JARDIANCE) 25 MG TABS tablet Take 25 mg by mouth daily before breakfast. (Patient not taking: Reported on 10/10/2019)  . Insulin Glargine (LANTUS SOLOSTAR) 100 UNIT/ML Solostar Pen TAKE 36 UNITS DAILY UNDER SKIN  . Insulin Pen Needle (PEN NEEDLES) 32G X 5 MM MISC 1 Units by Does not apply route daily.  Marland Kitchen losartan-hydrochlorothiazide (HYZAAR) 100-12.5 MG tablet Take 1 tablet by mouth daily.  . metFORMIN (GLUCOPHAGE-XR) 500 MG 24 hr tablet TAKE 2 TABLETS BY MOUTH EVERY DAY WITH BREAKFAST  . nitroGLYCERIN (NITROSTAT) 0.4 MG SL tablet Place 1 tablet (0.4 mg total) under the tongue every 5 (five) minutes as needed for chest pain. (Patient taking differently: Place 0.4 mg under the tongue daily. For arm/back pain)  . nortriptyline (PAMELOR) 50 MG  capsule TAKE 3 CAPSULES (150 MG TOTAL) BY MOUTH AT BEDTIME.  Marland Kitchen Omega-3 Fatty Acids (FISH OIL PO) Take 1 capsule by mouth daily.  Marland Kitchen oxyCODONE-acetaminophen (PERCOCET) 10-325 MG tablet Take 1-2 tablets by mouth every 6 (six) hours as needed for pain. M54.9  . polyethylene glycol (MIRALAX / GLYCOLAX) packet Take 17 g by mouth daily as needed.    No facility-administered encounter medications on file as of 11/09/2019.   Current Diagnosis/Assessment:   Emergency planning/management officer Strain: Medium Risk  . Difficulty of Paying Living Expenses: Somewhat hard   Goals Addressed   None    Hypertension   CMP Latest Ref Rng & Units 09/20/2019 09/05/2019 09/04/2019  Glucose 70 - 99 mg/dL 235(H) 224(H) 369(H)  BUN 8 - 23 mg/dL 15 10 10   Creatinine 0.44 - 1.00 mg/dL 0.86 0.72 0.70  Sodium 135 - 145 mmol/L 134(L) 138 137  Potassium 3.5 - 5.1 mmol/L 4.1 4.0 3.9  Chloride 98 - 111 mmol/L 101 102 105  CO2 22 - 32 mmol/L 24 28 24   Calcium 8.9 - 10.3 mg/dL 8.9 9.5 8.6(L)  Total Protein 6.5 - 8.1 g/dL - - 6.4(L)  Total Bilirubin 0.3 - 1.2 mg/dL - - 0.8  Alkaline Phos 38 - 126 U/L - - 74  AST 15 - 41 U/L - - 20  ALT 0 - 44 U/L - - 14   Office blood pressures are: BP Readings from Last 3 Encounters:  09/29/19 (!) 148/84  09/28/19 124/80  09/20/19 124/61  Patient has failed these meds in the past: none reported Patient checks BP at home: does not have home BP monitor Patient home BP readings are ranging: none reported   BP goal < 140/90 mmHg Patient is currently controlled on the following medications:   Amlodipine 2.5 mg - 1 tablet daily  Carvedilol 25 mg - 1 tablet BID  Losartan-HCTZ 100-12.5 mg - 1 tablet daily  We discussed: no changes in symptoms since last visit, denies dizziness/lightheadedness but is fatigued thus may be prudent to monitor home BP   Pertinent history: Amlodipine started per cardio on 09/29/19 following elevated BP in clinic; Pt confirms taking and denies ADEs with amlodipine.    Plan: Continue current medications; Consider purchasing home monitor for BP to ensure BP is controlled since starting amlodipine.   Hyperlipidemia   Lipid Panel     Component Value Date/Time   CHOL 158 09/05/2019 0549   TRIG 305 (H) 09/05/2019 0549   HDL 35 (L) 09/05/2019 0549   CHOLHDL 4.5 09/05/2019 0549   VLDL 61 (H) 09/05/2019 0549   LDLCALC 62 09/05/2019 0549   LDLDIRECT 75.0 05/24/2019 1201    CBC Latest Ref Rng & Units 09/20/2019 09/06/2019 09/05/2019  WBC 4.0 - 10.5 K/uL 10.2 12.5(H) 13.5(H)  Hemoglobin 12.0 - 15.0 g/dL 13.2 14.2 13.3  Hematocrit 36 - 46 % 39.3 41.1 37.7  Platelets 150 - 400 K/uL 226 224 213    LDL goal < 70 (CAD) Patient has failed these meds in past: none reported Patient is currently controlled on the following medications:  . Atorvastatin 80 mg - 1 tablet daily . Aspirin 81 mg - 1 tablet daily  . Plavix 75 mg - 1 tablet daily  . Omega-3 fatty acids - 1 capsule daily . Nitroglycerin 0.4 mg SL - 1 tablet daily (has helped with shoulder pain)  We discussed: LDL controlled although TG elevated and HDL low. Pt confirms adherence. Recent stent placement - DAPT appropriate. CBC stable.  Plan: Continue current medications; Recommend lifestyle modification to improve HDL. Try increasing exercises by 10 minute intervals as able with activities such as brisk walking or chair exercises.  Diabetes   Recent Relevant Labs: Lab Results  Component Value Date/Time   HGBA1C 11.8 (A) 08/24/2019 02:20 PM   HGBA1C 13.8 (H) 05/24/2019 12:01 PM   HGBA1C 10.7 (A) 01/21/2018 10:30 AM   HGBA1C 11.6 (H) 10/19/2017 10:51 AM    Checking BG: Daily (around 12-1 PM which is 1 hour after eating) Reports the following readings from the past 2 weeks: 2 days ago - 198, best - 150ish (before breakfast), highest 230 (after meal) Denies hypoglycemia  A1c goal < 7% Patient has failed these meds in past: none reported Patient is currently uncontrolled on the following medications:    Metformin 500 mg ER - 2 tablets daily with breakfast   Lantus - Inject 36 units daily  Jardiance 25 mg - 1 tablet daily (not taking)  Last diabetic eye exam:  Lab Results  Component Value Date/Time   HMDIABEYEEXA Retinopathy (A) 05/23/2018 12:00 AM    Last diabetic foot exam:  Lab Results  Component Value Date/Time   HMDIABFOOTEX done 05/24/2019 12:00 AM    We discussed:  Adherence:   Pt still unsure if she is taking Jardiance, no refill history per chart and she does not think she is taking it --> recommended going to [facility] today to pick up, call if any concerns/cost issues  Pt increased metformin from once daily to  twice daily per discussion at last visit, denies ADEs  Confirms insulin 36 units daily   BG monitoring: Discussed importance of checking fasting blood glucose daily - f/u in 2 weeks for BG log  Plan: Pt to pick up Jardiance 25 mg from [facility] today. Call if any difficulty picking up medication (should have refills from 12/2018). Check blood glucose first thing in the morning for next 2 weeks. Discuss DM education classes at follow up.  Chronic Pain/Sleep   Patient has failed these meds in past: none reported Patient is currently controlled on the following medications:   Oxycodone-APAP 10-325 mg - 1-2 tablets q6h PRN pain  Nortriptyline 50 mg - 3 capsules qhs (takes for sleep)  Bowel regimen: MiraLAX - PRN  We discussed: Reports trying melatonin 5 mg for the past 2 weeks with little effect, however, she has been falling asleep a little earlier then previously - 12-1 AM rather than 2 AM. Wondering about higher dose. Continues nortriptyline for sleep. Denies pain concerns.  Plan: Continue current medications; May increase melatonin to 10 mg to see if further benefit.   Vaccines   Reviewed and discussed patient's vaccination history.    Immunization History  Administered Date(s) Administered  . Influenza Split 02/15/2012  . Influenza Whole 03/11/2009  .  Influenza, Seasonal, Injecte, Preservative Fre 03/25/2016  . Influenza,inj,Quad PF,6+ Mos 03/20/2014, 02/09/2015, 02/23/2017, 01/21/2018, 05/24/2019  . Pneumococcal Conjugate-13 05/30/2013  . Pneumococcal Polysaccharide-23 05/18/2009  . Td 07/16/2008  . Zoster Recombinat (Shingrix) 09/15/2018   Plan: Recommended patient receive Tdap, 2nd dose of Shingrix in 2-6 months.    Medication Management  OTCs: Cranberry 500 mg cap daily - for UTI prevention  Ph[facility]efits[facility]  Adherence: reports doing better, denies missed doses in past week; declines interest in [facility] today for pharmacy coordination and management   Affordability: Lantus cost  CCM Follow Up:  11/09/19 at 1:00 PM (telephone) for blood glucose log  Debbora Dus, PharmD Clinical Pharmacist [facility] [phone]     Chronic Care Management Pharmacy  Name: Cynthia Singh  MRN: [id number] DOB: Mar 26, 1941  Chief Complaint/ HPI  Burley Saver,  79 y.o., female presents for their Initial CCM visit with the clinical pharmacist via telephone.  PCP : Venia Carbon, MD  Their chronic conditions include: hypertension, hyperlipidemia, CAD, unstable angina, PVD, type 2 DM, mood disorder, chronic back pain  Patient concerns: fatigue --> discussed impact of poorly controlled DM, importance of exercise  No office visits since last CCM visit, pt missed appt with endocrinology due to her son being sick   No Known Allergies  Medications: Outpatient Encounter Medications as of 11/09/2019  Medication Sig  . amLODipine (NORVASC) 2.5 MG tablet Take 1 tablet (2.5 mg total) by mouth daily.  Marland Kitchen aspirin EC 81 MG tablet Take 81 mg by mouth daily.  Marland Kitchen atorvastatin (LIPITOR) 80 MG tablet TAKE 1/2 TABLETS (40 MG TOTAL) BY MOUTH DAILY.  . carvedilol (COREG) 25 MG tablet Take 1 tablet (25 mg total) by mouth 2 (two) times daily with a meal.  . clopidogrel (PLAVIX) 75 MG tablet TAKE 1 TABLET BY  MOUTH EVERY DAY  . Cranberry 500 MG CAPS Take 1,000 mg by mouth daily.  . empagliflozin (JARDIANCE) 25 MG TABS tablet Take 25 mg by mouth daily before breakfast. (Patient not taking: Reported on 10/10/2019)  . Insulin Glargine (LANTUS SOLOSTAR) 100 UNIT/ML Solostar Pen TAKE 36 UNITS DAILY UNDER SKIN  . Insulin Pen Needle (PEN NEEDLES)  32G X 5 MM MISC 1 Units by Does not apply route daily.  Marland Kitchen losartan-hydrochlorothiazide (HYZAAR) 100-12.5 MG tablet Take 1 tablet by mouth daily.  . metFORMIN (GLUCOPHAGE-XR) 500 MG 24 hr tablet TAKE 2 TABLETS BY MOUTH EVERY DAY WITH BREAKFAST  . nitroGLYCERIN (NITROSTAT) 0.4 MG SL tablet Place 1 tablet (0.4 mg total) under the tongue every 5 (five) minutes as needed for chest pain. (Patient taking differently: Place 0.4 mg under the tongue daily. For arm/back pain)  . nortriptyline (PAMELOR) 50 MG capsule TAKE 3 CAPSULES (150 MG TOTAL) BY MOUTH AT BEDTIME.  Marland Kitchen Omega-3 Fatty Acids (FISH OIL PO) Take 1 capsule by mouth daily.  Marland Kitchen oxyCODONE-acetaminophen (PERCOCET) 10-325 MG tablet Take 1-2 tablets by mouth every 6 (six) hours as needed for pain. M54.9  . polyethylene glycol (MIRALAX / GLYCOLAX) packet Take 17 g by mouth daily as needed.    No facility-administered encounter medications on file as of 11/09/2019.   Current Diagnosis/Assessment:   Emergency planning/management officer Strain: Medium Risk  . Difficulty of Paying Living Expenses: Somewhat hard   Goals Addressed   None    Hypertension   CMP Latest Ref Rng & Units 09/20/2019 09/05/2019 09/04/2019  Glucose 70 - 99 mg/dL 235(H) 224(H) 369(H)  BUN 8 - 23 mg/dL 15 10 10   Creatinine 0.44 - 1.00 mg/dL 0.86 0.72 0.70  Sodium 135 - 145 mmol/L 134(L) 138 137  Potassium 3.5 - 5.1 mmol/L 4.1 4.0 3.9  Chloride 98 - 111 mmol/L 101 102 105  CO2 22 - 32 mmol/L 24 28 24   Calcium 8.9 - 10.3 mg/dL 8.9 9.5 8.6(L)  Total Protein 6.5 - 8.1 g/dL - - 6.4(L)  Total Bilirubin 0.3 - 1.2 mg/dL - - 0.8  Alkaline Phos 38 - 126 U/L - - 74  AST 15  - 41 U/L - - 20  ALT 0 - 44 U/L - - 14   Office blood pressures are: BP Readings from Last 3 Encounters:  09/29/19 (!) 148/84  09/28/19 124/80  09/20/19 124/61   Patient has failed these meds in the past: none reported Patient checks BP at home: does not have home BP monitor Patient home BP readings are ranging: none reported   BP goal < 140/90 mmHg Patient is currently controlled on the following medications:   Amlodipine 2.5 mg - 1 tablet daily  Carvedilol 25 mg - 1 tablet BID  Losartan-HCTZ 100-12.5 mg - 1 tablet daily  We discussed: no changes in symptoms since last visit, denies dizziness/lightheadedness but is fatigued thus may be prudent to monitor home BP   Pertinent history: Amlodipine started per cardio on 09/29/19 following elevated BP in clinic; Pt confirms taking and denies ADEs with amlodipine.   Plan: Continue current medications; Consider purchasing home monitor for BP to ensure BP is controlled since starting amlodipine.   Hyperlipidemia   Lipid Panel     Component Value Date/Time   CHOL 158 09/05/2019 0549   TRIG 305 (H) 09/05/2019 0549   HDL 35 (L) 09/05/2019 0549   CHOLHDL 4.5 09/05/2019 0549   VLDL 61 (H) 09/05/2019 0549   LDLCALC 62 09/05/2019 0549   LDLDIRECT 75.0 05/24/2019 1201    CBC Latest Ref Rng & Units 09/20/2019 09/06/2019 09/05/2019  WBC 4.0 - 10.5 K/uL 10.2 12.5(H) 13.5(H)  Hemoglobin 12.0 - 15.0 g/dL 13.2 14.2 13.3  Hematocrit 36 - 46 % 39.3 41.1 37.7  Platelets 150 - 400 K/uL 226 224 213    LDL goal < 70 (CAD)  Patient has failed these meds in past: none reported Patient is currently controlled on the following medications:  . Atorvastatin 80 mg - 1 tablet daily . Aspirin 81 mg - 1 tablet daily  . Plavix 75 mg - 1 tablet daily  . Omega-3 fatty acids - 1 capsule daily . Nitroglycerin 0.4 mg SL - 1 tablet daily (has helped with shoulder pain)  We discussed: LDL controlled although TG elevated and HDL low. Pt confirms adherence.  Recent stent placement - DAPT appropriate. CBC stable.  Plan: Continue current medications; Recommend lifestyle modification to improve HDL. Try increasing exercises by 10 minute intervals as able with activities such as brisk walking or chair exercises.  Diabetes   Recent Relevant Labs: Lab Results  Component Value Date/Time   HGBA1C 11.8 (A) 08/24/2019 02:20 PM   HGBA1C 13.8 (H) 05/24/2019 12:01 PM   HGBA1C 10.7 (A) 01/21/2018 10:30 AM   HGBA1C 11.6 (H) 10/19/2017 10:51 AM    Checking BG: Daily (around 12-1 PM which is 1 hour after eating) Reports the following readings from the past 2 weeks: 2 days ago - 198, best - 150ish (before breakfast), highest 230 (after meal) Denies hypoglycemia  A1c goal < 7% Patient has failed these meds in past: none reported Patient is currently uncontrolled on the following medications:   Metformin 500 mg ER - 2 tablets daily with breakfast   Lantus - Inject 36 units daily  Jardiance 25 mg - 1 tablet daily (not taking)  Last diabetic eye exam:  Lab Results  Component Value Date/Time   HMDIABEYEEXA Retinopathy (A) 05/23/2018 12:00 AM    Last diabetic foot exam:  Lab Results  Component Value Date/Time   HMDIABFOOTEX done 05/24/2019 12:00 AM    We discussed:  Adherence:   Pt still unsure if she is taking Jardiance, no refill history per chart and she does not think she is taking it --> [facility], call if any concerns/cost issues  Pt increased metformin from once daily to twice daily per discussion at last visit, denies ADEs  Confirms insulin 36 units daily   BG monitoring: Discussed importance of checking fasting blood glucose daily - f/u in 2 weeks for BG log  Plan: Pt to pick [facility] Jard[facility]. Call if any difficulty picking up medication (should have refills from 12/2018). Check blood glucose first thing in the morning for next 2 weeks. Discuss DM education classes at follow up.   Chronic Pain/Sleep   Patient has failed these meds in past: none reported Patient is currently controlled on the following medications:   Oxycodone-APAP 10-325 mg - 1-2 tablets q6h PRN pain  Nortriptyline 50 mg - 3 capsules qhs (takes for sleep)  Bowel regimen: MiraLAX - PRN  We discussed: Reports trying melatonin 5 mg for the past 2 weeks with little effect, however, she has been falling asleep a little earlier then previously - 12-1 AM rather than 2 AM. Wondering about higher dose. Continues nortriptyline for sleep. Denies pain concerns.  Plan: Continue current medications; May increase melatonin to 10 mg to see if further benefit.   Vaccines   Reviewed and discussed patient's vaccination history.    Immunization History  Administered Date(s) Administered  . Influenza Split 02/15/2012  . Influenza Whole 03/11/2009  . Influenza, Seasonal, Injecte, Preservative Fre 03/25/2016  . Influenza,inj,Quad PF,6+ Mos 03/20/2014, 02/09/2015, 02/23/2017, 01/21/2018, 05/24/2019  . Pneumococcal Conjugate-13 05/30/2013  . Pneumococcal Polysaccharide-23 05/18/2009  . Td  07/16/2008  . Zoster Recombinat (Shingrix) 09/15/2018   Plan: Recommended patient receive Tdap, 2nd dose of Shingrix in 2-6 months.    Medication Management  OTCs: Cranberry 500 mg cap daily - for UTI prevention  Ph[facility]efits[facility]  Adherence: reports doing better, denies missed doses in past week; declines interest in [facility] today for pharmacy coordination and management   Affordability: Lantus cost  CCM Follow Up:  11/09/19 at 1:00 PM (telephone) for blood glucose log  Debbora Dus, PharmD Clinical Pharmacist [facility] [phone]

## 2019-11-14 ENCOUNTER — Encounter: Payer: Self-pay | Admitting: Internal Medicine

## 2019-11-14 NOTE — Telephone Encounter (Signed)
Attempted to call both numbers listed. No answer, no voicemail

## 2019-11-15 NOTE — Telephone Encounter (Signed)
Attempted to schedule no ans no vm  

## 2019-11-21 ENCOUNTER — Other Ambulatory Visit: Payer: Self-pay | Admitting: Internal Medicine

## 2019-11-23 ENCOUNTER — Encounter: Payer: Self-pay | Admitting: Internal Medicine

## 2019-11-23 ENCOUNTER — Ambulatory Visit (INDEPENDENT_AMBULATORY_CARE_PROVIDER_SITE_OTHER): Payer: Medicare Other | Admitting: Internal Medicine

## 2019-11-23 ENCOUNTER — Other Ambulatory Visit: Payer: Self-pay

## 2019-11-23 VITALS — BP 126/78 | HR 84 | Temp 97.5°F | Ht 68.0 in | Wt 192.0 lb

## 2019-11-23 DIAGNOSIS — I2510 Atherosclerotic heart disease of native coronary artery without angina pectoris: Secondary | ICD-10-CM | POA: Diagnosis not present

## 2019-11-23 DIAGNOSIS — G8929 Other chronic pain: Secondary | ICD-10-CM

## 2019-11-23 DIAGNOSIS — E1165 Type 2 diabetes mellitus with hyperglycemia: Secondary | ICD-10-CM | POA: Diagnosis not present

## 2019-11-23 DIAGNOSIS — Z9861 Coronary angioplasty status: Secondary | ICD-10-CM

## 2019-11-23 DIAGNOSIS — E1159 Type 2 diabetes mellitus with other circulatory complications: Secondary | ICD-10-CM | POA: Diagnosis not present

## 2019-11-23 DIAGNOSIS — M5440 Lumbago with sciatica, unspecified side: Secondary | ICD-10-CM

## 2019-11-23 DIAGNOSIS — F1120 Opioid dependence, uncomplicated: Secondary | ICD-10-CM | POA: Diagnosis not present

## 2019-11-23 LAB — POCT GLYCOSYLATED HEMOGLOBIN (HGB A1C): Hemoglobin A1C: 9.5 % — AB (ref 4.0–5.6)

## 2019-11-23 NOTE — Progress Notes (Signed)
Subjective:    Patient ID: Cynthia Singh, female    DOB: Sep 18, 1940, 79 y.o.   MRN: [id number]  HPI Here for follow up of chronic pain and diabetes This visit occurred during the SARS-CoV-2 public health emergency.  Safety protocols were in place, including screening questions prior to the visit, additional usage of staff PPE, and extensive cleaning of exam room while observing appropriate contact time as indicated for disinfecting solutions.   Heart has been okay No chest pain or SOB Hasn't started cardiac rehab---urged her to go ahead Some dizzy spells--mild (when walking)---more balance.  No syncope No edema  Had to cancel appt with Dr Demaris Callander wouldn't start Has been eating better Making sure she isn't missing meds Checking sugars once a day or so---tends to be around 200  "My arthritis is trying to take over my entire body" Fingers are hurting more Back is still the worst part Taking 5 hydrocodone daily  Current Outpatient Medications on File Prior to Visit  Medication Sig Dispense Refill  . amLODipine (NORVASC) 2.5 MG tablet Take 1 tablet (2.5 mg total) by mouth daily. 90 tablet 3  . aspirin EC 81 MG tablet Take 81 mg by mouth daily.    Marland Kitchen atorvastatin (LIPITOR) 80 MG tablet TAKE 1/2 TABLETS (40 MG TOTAL) BY MOUTH DAILY. 45 tablet 3  . carvedilol (COREG) 25 MG tablet Take 1 tablet (25 mg total) by mouth 2 (two) times daily with a meal. 60 tablet 6  . clopidogrel (PLAVIX) 75 MG tablet TAKE 1 TABLET BY MOUTH EVERY DAY 90 tablet 3  . Cranberry 500 MG CAPS Take 1,000 mg by mouth daily.    . empagliflozin (JARDIANCE) 25 MG TABS tablet Take 25 mg by mouth daily before breakfast. 30 tablet 5  . Insulin Glargine (LANTUS SOLOSTAR) 100 UNIT/ML Solostar Pen TAKE 36 UNITS DAILY UNDER SKIN 15 mL 11  . Insulin Pen Needle (PEN NEEDLES) 32G X 5 MM MISC 1 Units by Does not apply route daily. 100 each 3  . losartan-hydrochlorothiazide (HYZAAR) 100-12.5 MG tablet Take 1 tablet by  mouth daily. 90 tablet 3  . metFORMIN (GLUCOPHAGE-XR) 500 MG 24 hr tablet TAKE 2 TABLETS BY MOUTH EVERY DAY WITH BREAKFAST 180 tablet 3  . nitroGLYCERIN (NITROSTAT) 0.4 MG SL tablet Place 1 tablet (0.4 mg total) under the tongue every 5 (five) minutes as needed for chest pain. (Patient taking differently: Place 0.4 mg under the tongue daily. For arm/back pain) 30 tablet 0  . nortriptyline (PAMELOR) 50 MG capsule TAKE 3 CAPSULES (150 MG TOTAL) BY MOUTH AT BEDTIME. 270 capsule 3  . Omega-3 Fatty Acids (FISH OIL PO) Take 1 capsule by mouth daily.    Marland Kitchen oxyCODONE-acetaminophen (PERCOCET) 10-325 MG tablet Take 1-2 tablets by mouth every 6 (six) hours as needed for pain. M54.9 150 tablet 0  . polyethylene glycol (MIRALAX / GLYCOLAX) packet Take 17 g by mouth daily as needed.      No current facility-administered medications on file prior to visit.    No Known Allergies  Past Medical History:  Diagnosis Date  . CAD (coronary artery disease)    a. Remote nonobstructive disease but in 10/2012 Cath/PCI: s/p DES to RCA. b. cath 02/08/15 DES to prox LAD and balloon angioplasty of ost D1, EF 55-65%; c. 08/2019 PCI/DES prox/distal RCA (overlapping prev RCA stent). Prev placed RCA/LAD stents patent.  . Concussion    [facility]  . Depression   . Diabetes mellitus, type 2 ([place])   .  Dyslipidemia   . Episodic mood disorder ([facility])   . Hypertension   . Myocardial infarction ([facility])    2014  . Obesity   . Osteoarthritis of spine    knees also  . Peripheral vascular disease, unspecified ([facility]) 2020   severe disease on home screening by insurance    Past Surgical History:  Procedure Laterality Date  . ABDOMINAL HYSTERECTOMY    . APPENDECTOMY  1959  . CARDIAC CATHETERIZATION N/A 02/08/2015   Procedure: Left Heart Cath and Coronary Angiography;  Surgeon: Sherren Mocha, MD;  Location: [facility];  Service: Cardiovascular;  Laterality: N/A;  . CARDIAC CATHETERIZATION N/A 02/08/2015   Procedure:  Coronary Stent Intervention;  Surgeon: Sherren Mocha, MD;  Location: [facility];  Service: Cardiovascular;  Laterality: N/A;  . CARDIAC CATHETERIZATION N/A 02/08/2015   Procedure: Intravascular Pressure Wire/FFR Study;  Surgeon: Sherren Mocha, MD;  Location: [facility];  Service: Cardiovascular;  Laterality: N/A;  . CATARACT EXTRACTION W/PHACO Left 11/10/2016   Procedure: CATARACT EXTRACTION PHACO AND INTRAOCULAR LENS PLACEMENT ([facility]) suture placed in left eye at end of procedure;  Surgeon: Birder Robson, MD;  Location: [facility];  Service: Ophthalmology;  Laterality: Left;  [place]  3.20 AP% 27.7 CDE 55.63 Fluid pack lot # [id number]  . CHOLECYSTECTOMY    . COMBINED HYSTERECTOMY ABDOMINAL W/ A&P REPAIR / OOPHORECTOMY  1968  . CORONARY ANGIOPLASTY     STENTS X 5  . CORONARY STENT INTERVENTION N/A 09/19/2019   Procedure: CORONARY STENT INTERVENTION;  Surgeon: Nelva Bush, MD;  Location: [facility];  Service: Cardiovascular;  Laterality: N/A;  . CORONARY STENT PLACEMENT  2014  . DILATION AND CURETTAGE OF UTERUS    . Cynthia Singh  . LEFT HEART CATH AND CORONARY ANGIOGRAPHY Left 09/19/2019   Procedure: LEFT HEART CATH AND CORONARY ANGIOGRAPHY;  Surgeon: Nelva Bush, MD;  Location: [facility];  Service: Cardiovascular;  Laterality: Left;  . LEFT HEART CATHETERIZATION WITH CORONARY ANGIOGRAM N/A 10/20/2012   Procedure: LEFT HEART CATHETERIZATION WITH CORONARY ANGIOGRAM;  Surgeon: Sherren Mocha, MD;  Location: [facility];  Service: Cardiovascular;  Laterality: N/A;  . multiple D&C    . TONSILLECTOMY  age 30    Family History  Adopted: Yes  Problem Relation Age of Onset  . Cancer Brother        Colon  . Coronary artery disease Neg Hx   . Heart attack Neg Hx     Social History   Socioeconomic History  . Marital status: Widowed    Spouse name: Not on file  . Number of children: 3  . Years of education: 32  . Highest education level: Not  on file  Occupational History  . Occupation: Scientist, water quality  Tobacco Use  . Smoking status: Never Smoker  . Smokeless tobacco: Never Used  Vaping Use  . Vaping Use: Never used  Substance and Sexual Activity  . Alcohol use: No  . Drug use: No  . Sexual activity: Never    Birth control/protection: Post-menopausal  Other Topics Concern  . Not on file  Social History Narrative   Widowed '13. 2 sons- '63, '66; one daughter-'69;    35 grandchildren--has raised 9 of her grandchildren 4 great grandchildren.   Lives with 2 granddaughters and adoptive son      Has living will   Probably would have son Cynthia Singh as health care POA--not set up   Would accept resuscitation but no prolonged  ventilation   Not sure about tube feeds   Social Determinants of Health   Financial Resource Strain: Medium Risk  . Difficulty of Paying Living Expenses: Somewhat hard  Food Insecurity:   . Worried About Charity fundraiser in the Last Year:   . Arboriculturist in the Last Year:   Transportation Needs:   . Film/video editor (Medical):   Marland Kitchen Lack of Transportation (Non-Medical):   Physical Activity:   . Days of Exercise per Week:   . Minutes of Exercise per Session:   Stress:   . Feeling of Stress :   Social Connections:   . Frequency of Communication with Friends and Family:   . Frequency of Social Gatherings with Friends and Family:   . Attends Religious Services:   . Active Member of Clubs or Organizations:   . Attends Archivist Meetings:   Marland Kitchen Marital Status:   Intimate Partner Violence:   . Fear of Current or Ex-Partner:   . Emotionally Abused:   Marland Kitchen Physically Abused:   . Sexually Abused:    Review of Systems  Sleep is variable Some nausea with nortriptyline--asked her to try it earlier Bowels are fine Weight is up some--?related to more consistency with insulin     Objective:   Physical Exam Constitutional:      Appearance: Normal appearance.  Cardiovascular:     Rate  and Rhythm: Normal rate and regular rhythm.     Heart sounds: No murmur heard.  No gallop.   Pulmonary:     Effort: Pulmonary effort is normal.     Breath sounds: Normal breath sounds. No wheezing or rales.  Musculoskeletal:     Cervical back: Neck supple.     Right lower leg: No edema.     Left lower leg: No edema.  Lymphadenopathy:     Cervical: No cervical adenopathy.  Neurological:     Mental Status: She is alert.  Psychiatric:        Mood and Affect: Mood normal.        Behavior: Behavior normal.            Assessment & Plan:

## 2019-11-23 NOTE — Assessment & Plan Note (Signed)
PDMP reviewed No concerns 

## 2019-11-23 NOTE — Assessment & Plan Note (Signed)
Lab Results  Component Value Date   HGBA1C 9.5 (A) 11/23/2019   Still high but MUCH better Likely from better med compliance Discussed increased exercise (do the cardiac rehab)

## 2019-11-23 NOTE — Assessment & Plan Note (Signed)
No chest pain since angioplasty Suggested she proceed with the cardiac rehab On ASA, plavix, statin, losartan, carvedilol

## 2019-11-23 NOTE — Assessment & Plan Note (Signed)
Ongoing  Gets by with her current hydrocodone

## 2019-12-07 ENCOUNTER — Other Ambulatory Visit: Payer: Self-pay

## 2019-12-07 MED ORDER — OXYCODONE-ACETAMINOPHEN 10-325 MG PO TABS: 1.0000 | ORAL_TABLET | Freq: Four times a day (QID) | ORAL | 0 refills | Status: DC | PRN

## 2019-12-07 NOTE — Telephone Encounter (Signed)
Name of Medication: percocet 10-325 mg Name of Pharmacy: [facility] or Written Date and Quantity: # 150 on 11-08-19 Last Office Visit and Type: 11-23-19 Next Office Visit and Type: 02-23-20 Last Controlled Substance Agreement Date:08/29/19  Last UDS:08/24/19

## 2020-01-08 ENCOUNTER — Other Ambulatory Visit: Payer: Self-pay | Admitting: *Deleted

## 2020-01-08 MED ORDER — OXYCODONE-ACETAMINOPHEN 10-325 MG PO TABS: 1.0000 | ORAL_TABLET | Freq: Four times a day (QID) | ORAL | 0 refills | Status: DC | PRN

## 2020-01-08 NOTE — Telephone Encounter (Signed)
Name of Medication: Oxycodone Name of [facility] Last Fill or Written Date and Quantity: 12/07/19 #150 Last Office Visit and Type: 11/23/19 Next Office Visit and Type: 02/23/20 Last Controlled Substance Agreement Date: 08/29/19 Last UDS:08/24/19

## 2020-01-12 ENCOUNTER — Other Ambulatory Visit: Payer: Self-pay | Admitting: Internal Medicine

## 2020-01-12 NOTE — Telephone Encounter (Signed)
After further discussion with patient and pharmacy, I have concluded the following:  1.  Patient states she has been taking "like 1 pill a day actually, its an oblong yellow pill" and does not remember stopping it.  The description of the look of the pill is accurate. Patient says she does not remember telling CMA in January that she stopped medication.   2.  Pharmacy says they have not filled it for her since June 2020.  She denies having it filled at another pharmacy or by another physician but says she is still taking it.   3.  I note in her chart that she has had history of non compliance with medications.  I am not sure how much this has been a factor with the consistency of how she is taking it and why she still has pills left from the last rx.   Dr. Silvio Pate is out of the office.  After discussing with a covering provider, Alma Friendly, NP, we will fill for a 1 month supply only as patient reports taking 1 pill daily  we do not want patient to stop cold [place]) and copy to Dr. Silvio Pate for further information and follow up with patient.     Med:  Levetiracetam (Keppra) 500mg  1 po bid   Last Refill on our record: #60, 11 refills on 04/07/2018 Pharmacy: [facility] Last Appt: 11/23/19  Next Appt: 03/06/20

## 2020-01-12 NOTE — Telephone Encounter (Signed)
Appears that medication was discontinued on 06/14/19; however when calling to ask patient, she says that she is still taking it as prescribed.   She is taking her last pill today and needs to know whether she is to continue or if she should be titrating off of the medication.   Dr. Silvio Pate is out of the office so will forward to a covering provider to discuss and plan what is best next step as the discontinue on the order may not have been correct.

## 2020-01-14 NOTE — Telephone Encounter (Signed)
The keppra goes back to a subdural hematoma back in 2018---but was not meant for long term Rx She can go down to one a day for 2 weeks and then stop it

## 2020-01-17 NOTE — Telephone Encounter (Signed)
Attempted to schedule.  

## 2020-01-18 ENCOUNTER — Other Ambulatory Visit: Payer: Self-pay | Admitting: Primary Care

## 2020-01-19 ENCOUNTER — Ambulatory Visit: Payer: Medicare Other | Admitting: Nurse Practitioner

## 2020-01-23 ENCOUNTER — Other Ambulatory Visit: Payer: Self-pay

## 2020-01-23 NOTE — Telephone Encounter (Signed)
Pt left v/m that metformin refill had been denied; in v/m pt said the doctor that called her about her meds advised pt to take metformin 500 mg taking 2 tabs twice a day with breakfast and supper. See chronic care mgt with Debbora Dus pharmacist. In that note was mentioned to take metformin 500 mg taking 2 tabs twice a day with breakfast and supper. Pt saw Dr Silvio Pate on 11/23/19  But med list still has metformin 500 mg (24 hr tab) taking 2 tabs po at breakfast. Pt request cb.

## 2020-01-24 MED ORDER — METFORMIN HCL ER 500 MG PO TB24: 1000.0000 mg | ORAL_TABLET | Freq: Two times a day (BID) | ORAL | 3 refills | Status: DC

## 2020-01-24 MED ORDER — METFORMIN HCL ER 500 MG PO TB24: 500.0000 mg | ORAL_TABLET | Freq: Two times a day (BID) | ORAL | 3 refills | Status: DC

## 2020-01-24 NOTE — Addendum Note (Signed)
Addended by: Pilar Grammes on: 01/24/2020 03:32 PM   Modules accepted: Orders

## 2020-01-24 NOTE — Telephone Encounter (Signed)
Patient called in again checking on status of medication. Patient stated she is needing this asap as she is completely out. Please advise.

## 2020-01-24 NOTE — Telephone Encounter (Signed)
If she is taking the 2 twice a day---please change the prescription to reflect that

## 2020-01-29 NOTE — Telephone Encounter (Signed)
Spoke with patient.  She is already only taking 1 pill per day so she will just finish out the bottle she has (about 2 weeks left) and then stop it.   Patient verbalizes understanding and has no further questions.

## 2020-02-07 ENCOUNTER — Other Ambulatory Visit: Payer: Self-pay | Admitting: *Deleted

## 2020-02-07 ENCOUNTER — Other Ambulatory Visit: Payer: Self-pay | Admitting: Primary Care

## 2020-02-07 MED ORDER — OXYCODONE-ACETAMINOPHEN 10-325 MG PO TABS: 1.0000 | ORAL_TABLET | Freq: Four times a day (QID) | ORAL | 0 refills | Status: DC | PRN

## 2020-02-07 NOTE — Telephone Encounter (Signed)
Patient left a voicemail requesting a refill Name of Medication: Percocet Name [facility] Last Fill or Written Date and Quantity: 01/08/20 #150 Last Office Visit and Type: 11/23/19 Next Office Visit and Type: 03/06/20 Last Controlled Substance Agreement Date: 08/29/19 Last UDS:08/24/19

## 2020-02-09 ENCOUNTER — Ambulatory Visit: Payer: Medicare Other | Admitting: Nurse Practitioner

## 2020-02-09 NOTE — Telephone Encounter (Signed)
To PCP

## 2020-02-09 NOTE — Telephone Encounter (Signed)
Looks like you refilled once but not PCP. Ok to send to PCP to approve?

## 2020-02-11 NOTE — Telephone Encounter (Signed)
I think this is from her brain bleed in 2018---but should be able to come of it now. I believe we discussed weaning off---down to 1 per day and now ready to stop. Confirm we discussed this

## 2020-02-12 NOTE — Telephone Encounter (Signed)
Correct. I spoke to pt. She is currently weaning off and is almost done and off.

## 2020-02-15 ENCOUNTER — Ambulatory Visit: Payer: Medicare Other | Admitting: Nurse Practitioner

## 2020-02-23 ENCOUNTER — Ambulatory Visit: Payer: Medicare Other | Admitting: Internal Medicine

## 2020-03-06 ENCOUNTER — Ambulatory Visit: Payer: Medicare Other | Admitting: Internal Medicine

## 2020-03-07 ENCOUNTER — Other Ambulatory Visit: Payer: Self-pay

## 2020-03-07 MED ORDER — OXYCODONE-ACETAMINOPHEN 10-325 MG PO TABS: 1.0000 | ORAL_TABLET | Freq: Four times a day (QID) | ORAL | 0 refills | Status: DC | PRN

## 2020-03-07 NOTE — Telephone Encounter (Signed)
Name of Medication: Oxycodone Name [facility] Last Fill or Written Date and Quantity: 02/07/20 #150 Last Office Visit and Type: 11/23/19 Next Office Visit and Type: 03/21/20 Last Controlled Substance Agreement Date: 08/29/19 Last UDS:08/24/19

## 2020-03-21 ENCOUNTER — Encounter: Payer: Self-pay | Admitting: Internal Medicine

## 2020-03-21 ENCOUNTER — Other Ambulatory Visit: Payer: Self-pay

## 2020-03-21 ENCOUNTER — Ambulatory Visit (INDEPENDENT_AMBULATORY_CARE_PROVIDER_SITE_OTHER): Payer: Medicare Other | Admitting: Internal Medicine

## 2020-03-21 VITALS — BP 128/84 | HR 69 | Temp 97.0°F | Ht 68.0 in | Wt 191.0 lb

## 2020-03-21 DIAGNOSIS — E1165 Type 2 diabetes mellitus with hyperglycemia: Secondary | ICD-10-CM | POA: Diagnosis not present

## 2020-03-21 DIAGNOSIS — Z23 Encounter for immunization: Secondary | ICD-10-CM | POA: Diagnosis not present

## 2020-03-21 DIAGNOSIS — I25119 Atherosclerotic heart disease of native coronary artery with unspecified angina pectoris: Secondary | ICD-10-CM

## 2020-03-21 DIAGNOSIS — G8929 Other chronic pain: Secondary | ICD-10-CM

## 2020-03-21 DIAGNOSIS — E1159 Type 2 diabetes mellitus with other circulatory complications: Secondary | ICD-10-CM

## 2020-03-21 DIAGNOSIS — M549 Dorsalgia, unspecified: Secondary | ICD-10-CM | POA: Diagnosis not present

## 2020-03-21 DIAGNOSIS — F1120 Opioid dependence, uncomplicated: Secondary | ICD-10-CM

## 2020-03-21 LAB — POCT GLYCOSYLATED HEMOGLOBIN (HGB A1C): Hemoglobin A1C: 8.2 % — AB (ref 4.0–5.6)

## 2020-03-21 NOTE — Assessment & Plan Note (Addendum)
Her control is about the same---but she is not checking fasting sugars If still up, would increase lantus or consider adding semaglutide Still on jardiance and max dose metformin  Lab Results  Component Value Date   HGBA1C 8.2 (A) 03/21/2020   Better! Will hold off on more medications

## 2020-03-21 NOTE — Assessment & Plan Note (Signed)
PDMP reviewed No concerns 

## 2020-03-21 NOTE — Assessment & Plan Note (Signed)
Brief atypical chest pain and rare breathing issues On statin, losartan, asa, jardiance

## 2020-03-21 NOTE — Progress Notes (Deleted)
   Follow-up Outpatient Visit Date: 03/22/2020  Primary Care Provider: Venia Carbon, MD [place] [address]  Chief Complaint: ***  HPI:  [name] is a 79 y.o. female with history of coronary  artery disease status post multiple PCI's, poorly controlled diabetes mellitus, bilateral subdural hematomas in the setting of mechanical fall in 2018 while on dual antiplatelet therapy, hypertension, hyperlipidemia, dilated ascending aorta, and osteoarthritis, who presents for follow-up of CAD.  I last saw her in May, at which time she was feeling a little better after preceding hospitalization for NSTEMI with shoulder/upper back pain.  She declined catheterization and was managed medically.  Myocardial perfusion stress test was low-risk.  However, given continued symptoms after her hospitalization, she was underwent LHC and PCI to the proximal and distal RCA in early May.  .  --------------------------------------------------------------------------------------------------  ***  Recent CV Pertinent Labs: Lab Results  Component Value Date   CHOL 158 09/05/2019   HDL 35 (L) 09/05/2019   LDLCALC 62 09/05/2019   LDLDIRECT 75.0 05/24/2019   TRIG 305 (H) 09/05/2019   CHOLHDL 4.5 09/05/2019   INR 1.0 09/04/2019   K 4.1 09/20/2019   MG 2.1 07/19/2016   BUN 15 09/20/2019   CREATININE 0.86 09/20/2019    Past medical and surgical history were reviewed and updated in EPIC.  No outpatient medications have been marked as taking for the 03/22/20 encounter (Appointment) with Hannahmarie Asberry, Harrell Gave, MD.    Allergies: Patient has no known allergies.  Social History   Tobacco Use  . Smoking status: Never Smoker  . Smokeless tobacco: Never Used  Vaping Use  . Vaping Use: Never used  Substance Use Topics  . Alcohol use: No  . Drug use: No    Family History  Adopted: Yes  Problem Relation Age of Onset  . Cancer Brother        Colon  . Coronary artery disease Neg Hx   .  Heart attack Neg Hx     Review of Systems: A 12-system review of systems was performed and was negative except as noted in the HPI.  --------------------------------------------------------------------------------------------------  Physical Exam: There were no vitals taken for this visit.  General:  *** HEENT: No conjunctival pallor or scleral icterus. Facemask in place. Neck: Supple without lymphadenopathy, thyromegaly, JVD, or HJR. Lungs: Normal work of breathing. Clear to auscultation bilaterally without wheezes or crackles. Heart: Regular rate and rhythm without murmurs, rubs, or gallops. Non-displaced PMI. Abd: Bowel sounds present. Soft, NT/ND without hepatosplenomegaly Ext: No lower extremity edema. Radial, PT, and DP pulses are 2+ bilaterally. Skin: Warm and dry without rash.  EKG:  ***  Lab Results  Component Value Date   WBC 10.2 09/20/2019   HGB 13.2 09/20/2019   HCT 39.3 09/20/2019   MCV 88.5 09/20/2019   PLT 226 09/20/2019    Lab Results  Component Value Date   NA 134 (L) 09/20/2019   K 4.1 09/20/2019   CL 101 09/20/2019   CO2 24 09/20/2019   BUN 15 09/20/2019   CREATININE 0.86 09/20/2019   GLUCOSE 235 (H) 09/20/2019   ALT 14 09/04/2019    Lab Results  Component Value Date   CHOL 158 09/05/2019   HDL 35 (L) 09/05/2019   LDLCALC 62 09/05/2019   LDLDIRECT 75.0 05/24/2019   TRIG 305 (H) 09/05/2019   CHOLHDL 4.5 09/05/2019    --------------------------------------------------------------------------------------------------  ASSESSMENT AND PLAN: Nelva Bush, MD 03/21/2020 9:04 PM

## 2020-03-21 NOTE — Assessment & Plan Note (Signed)
Ongoing chronic pain but remains functional on the oxycodone 5 per day

## 2020-03-21 NOTE — Progress Notes (Signed)
Subjective:    Patient ID: Cynthia Singh, female    DOB: 1941/05/10, 79 y.o.   MRN: [id number]  HPI Here for follow up of chronic pain and diabetes This visit occurred during the SARS-CoV-2 public health emergency.  Safety protocols were in place, including screening questions prior to the visit, additional usage of staff PPE, and extensive cleaning of exam room while observing appropriate contact time as indicated for disinfecting solutions.   Not doing so well Son has stage 4 liver cancer---having a hard time with this Having uncontrolled pain  Her pain is about the same Mostly in back and both legs Continues on the oxycodone 5 times per day notriptyline at bedtime--may help sleep but takes a long time  Checks sugars most days Usually 200--but after eating Doesn't check fasting sugars Consistent taking all the medications  Will have some chest pain Mostly when just sitting around Dull pain that goes away fairly quickly--hasn't needed the nitro Did have one spell of trouble catching breath--not with chest pain Has been on the go--with son to [facility] for medical care  Current Outpatient Medications on File Prior to Visit  Medication Sig Dispense Refill   aspirin EC 81 MG tablet Take 81 mg by mouth daily.     atorvastatin (LIPITOR) 80 MG tablet TAKE 1/2 TABLETS (40 MG TOTAL) BY MOUTH DAILY. 45 tablet 3   carvedilol (COREG) 25 MG tablet Take 1 tablet (25 mg total) by mouth 2 (two) times daily with a meal. 60 tablet 6   clopidogrel (PLAVIX) 75 MG tablet TAKE 1 TABLET BY MOUTH EVERY DAY 90 tablet 3   Cranberry 500 MG CAPS Take 1,000 mg by mouth daily.     empagliflozin (JARDIANCE) 25 MG TABS tablet Take 25 mg by mouth daily before breakfast. 30 tablet 5   Insulin Glargine (LANTUS SOLOSTAR) 100 UNIT/ML Solostar Pen TAKE 36 UNITS DAILY UNDER SKIN 15 mL 11   Insulin Pen Needle (PEN NEEDLES) 32G X 5 MM MISC 1 Units by Does not apply route daily. 100 each 3    losartan-hydrochlorothiazide (HYZAAR) 100-12.5 MG tablet Take 1 tablet by mouth daily. 90 tablet 3   metFORMIN (GLUCOPHAGE-XR) 500 MG 24 hr tablet Take 2 tablets (1,000 mg total) by mouth in the morning and at bedtime. 360 tablet 3   nitroGLYCERIN (NITROSTAT) 0.4 MG SL tablet Place 1 tablet (0.4 mg total) under the tongue every 5 (five) minutes as needed for chest pain. (Patient taking differently: Place 0.4 mg under the tongue daily. For arm/back pain) 30 tablet 0   nortriptyline (PAMELOR) 50 MG capsule TAKE 3 CAPSULES (150 MG TOTAL) BY MOUTH AT BEDTIME. 270 capsule 3   Omega-3 Fatty Acids (FISH OIL PO) Take 1 capsule by mouth daily.     oxyCODONE-acetaminophen (PERCOCET) 10-325 MG tablet Take 1-2 tablets by mouth every 6 (six) hours as needed for pain. M54.9 150 tablet 0   polyethylene glycol (MIRALAX / GLYCOLAX) packet Take 17 g by mouth daily as needed.      amLODipine (NORVASC) 2.5 MG tablet Take 1 tablet (2.5 mg total) by mouth daily. 90 tablet 3   No current facility-administered medications on file prior to visit.    No Known Allergies  Past Medical History:  Diagnosis Date   CAD (coronary artery disease)    a. Remote nonobstructive disease but in 10/2012 Cath/PCI: s/p DES to RCA. b. cath 02/08/15 DES to prox LAD and balloon angioplasty of ost D1, EF 55-65%; c. 08/2019 PCI/DES prox/distal  RCA (overlapping prev RCA stent). Prev placed RCA/LAD stents patent.   Concussion    [facility] AFTER FALL 3/18   Depression    Diabetes mellitus, type 2 ([place])    Dyslipidemia    Episodic mood disorder ([place])    Hypertension    Myocardial infarction ([place])    2014   Obesity    Osteoarthritis of spine    knees also   Peripheral vascular disease, unspecified ([place]) 2020   severe disease on home screening by insurance    Past Surgical History:  Procedure Laterality Date   ABDOMINAL HYSTERECTOMY     [place] N/A 02/08/2015   Procedure: Left Heart  Cath and Coronary Angiography;  Surgeon: Sherren Mocha, MD;  Location: [place] [facility];  Service: Cardiovascular;  Laterality: N/A;   CARDIAC CATHETERIZATION N/A 02/08/2015   Procedure: Coronary Stent Intervention;  Surgeon: Sherren Mocha, MD;  Location: [place] [facility];  Service: Cardiovascular;  Laterality: N/A;   CARDIAC CATHETERIZATION N/A 02/08/2015   Procedure: Intravascular Pressure Wire/FFR Study;  Surgeon: Sherren Mocha, MD;  Location: [place] [facility];  Service: Cardiovascular;  Laterality: N/A;   CATARACT EXTRACTION W/PHACO Left 11/10/2016   Procedure: CATARACT EXTRACTION PHACO AND INTRAOCULAR LENS PLACEMENT ([place]) suture placed in left eye at end of procedure;  Surgeon: Birder Robson, MD;  Location: [facility];  Service: Ophthalmology;  Laterality: Left;  [place]  3.20 AP% 27.7 CDE 55.63 Fluid pack lot # [id number]   CHOLECYSTECTOMY     COMBINED HYSTERECTOMY ABDOMINAL W/ A&P REPAIR / OOPHORECTOMY  1968   CORONARY ANGIOPLASTY     STENTS X 5   CORONARY STENT INTERVENTION N/A 09/19/2019   Procedure: CORONARY STENT INTERVENTION;  Surgeon: Nelva Bush, MD;  Location: [place] [facility];  Service: Cardiovascular;  Laterality: N/A;   CORONARY STENT PLACEMENT  2014   DILATION AND CURETTAGE OF UTERUS     GALLBLADDER SURGERY  1968   LEFT HEART CATH AND CORONARY ANGIOGRAPHY Left 09/19/2019   Procedure: LEFT HEART CATH AND CORONARY ANGIOGRAPHY;  Surgeon: Nelva Bush, MD;  Location: [place] [facility];  Service: Cardiovascular;  Laterality: Left;   LEFT HEART CATHETERIZATION WITH CORONARY ANGIOGRAM N/A 10/20/2012   Procedure: LEFT HEART CATHETERIZATION WITH CORONARY ANGIOGRAM;  Surgeon: Sherren Mocha, MD;  Location: [facility];  Service: Cardiovascular;  Laterality: N/A;   multiple D&C     TONSILLECTOMY  age 63    Family History  Adopted: Yes  Problem Relation Age of Onset   Cancer Brother        Colon   Coronary artery disease Neg Hx    Heart attack Neg Hx      Social History   Socioeconomic History   Marital status: Widowed    Spouse name: Not on file   Number of children: 3   Years of education: 36   Highest education level: Not on file  Occupational History   Occupation: CASHIER  Tobacco Use   Smoking status: Never Smoker   Smokeless tobacco: Never Used  Vaping Use   Vaping Use: Never used  Substance and Sexual Activity   Alcohol use: No   Drug use: No   Sexual activity: Never    Birth control/protection: Post-menopausal  Other Topics Concern   Not on file  Social History Narrative   Widowed '13. 2 sons- '63, '66; one daughter-'69;    59 grandchildren--has raised 9 of her grandchildren 4 great grandchildren.   Lives with 2  granddaughters and adoptive son      Has living will   Probably would have son Anija Brickner as health care POA--not set up   Would accept resuscitation but no prolonged ventilation   Not sure about tube feeds   Social Determinants of Health   Financial Resource Strain: Medium Risk   Difficulty of Paying Living Expenses: Somewhat hard  Food Insecurity:    Worried About Charity fundraiser in the Last Year: Not on file   [employer] of Food in the Last Year: Not on file  Transportation Needs:    Lack of Transportation (Medical): Not on file   Lack of Transportation (Non-Medical): Not on file  Physical Activity:    Days of Exercise per Week: Not on file   Minutes of Exercise per Session: Not on file  Stress:    Feeling of Stress : Not on file  Social Connections:    Frequency of Communication with Friends and Family: Not on file   Frequency of Social Gatherings with Friends and Family: Not on file   Attends Religious Services: Not on file   Active Member of Clubs or Organizations: Not on file   Attends Archivist Meetings: Not on file   Marital Status: Not on file  Intimate Partner Violence:    Fear of Current or Ex-Partner: Not on file   Emotionally Abused:  Not on file   Physically Abused: Not on file   Sexually Abused: Not on file   Review of Systems  Eating okay Weight is stable     Objective:   Physical Exam Constitutional:      Appearance: Normal appearance.  Cardiovascular:     Rate and Rhythm: Normal rate and regular rhythm.     Heart sounds: No murmur heard.  No gallop.   Pulmonary:     Effort: Pulmonary effort is normal.     Breath sounds: Normal breath sounds. No wheezing or rales.  Musculoskeletal:     Cervical back: Neck supple.     Right lower leg: No edema.     Left lower leg: No edema.  Lymphadenopathy:     Cervical: No cervical adenopathy.  Neurological:     Mental Status: She is alert.  Psychiatric:     Comments: Upset about son but no overt depression            Assessment & Plan:

## 2020-03-21 NOTE — Addendum Note (Signed)
Addended by: Pilar Grammes on: 03/21/2020 05:30 PM   Modules accepted: Orders

## 2020-03-22 ENCOUNTER — Ambulatory Visit: Payer: Medicare Other | Admitting: Internal Medicine

## 2020-04-02 ENCOUNTER — Ambulatory Visit: Payer: Medicare Other | Admitting: Family

## 2020-04-08 ENCOUNTER — Other Ambulatory Visit: Payer: Self-pay | Admitting: *Deleted

## 2020-04-08 MED ORDER — OXYCODONE-ACETAMINOPHEN 10-325 MG PO TABS: 1.0000 | ORAL_TABLET | Freq: Four times a day (QID) | ORAL | 0 refills | Status: DC | PRN

## 2020-04-08 NOTE — Telephone Encounter (Signed)
Patient left a voicemail requesting a refill on Percocet  Name of Medication: Percocet [facility] Last Fill or Written Date and Quantity: 03/07/20 #150 Last Office Visit and Type: 03/21/20 Next Office Visit and Type: 07/11/20 Last Controlled Substance Agreement Date: 08/29/19 Last UDS: 08/24/19

## 2020-05-06 ENCOUNTER — Other Ambulatory Visit: Payer: Self-pay | Admitting: *Deleted

## 2020-05-06 MED ORDER — OXYCODONE-ACETAMINOPHEN 10-325 MG PO TABS: 1.0000 | ORAL_TABLET | Freq: Four times a day (QID) | ORAL | 0 refills | Status: DC | PRN

## 2020-05-06 NOTE — Telephone Encounter (Signed)
Patient left a voicemail requesting a refill on Percocet Name of Medication: Percocet [facility] Last Fill or Written Date and Quantity: 04/08/20 #150 Last Office Visit and Type: 03/21/20 Next Office Visit and Type: 07/11/20 Last Controlled Substance Agreement Date: 08/29/19 Last UDS:08/24/19

## 2020-05-17 ENCOUNTER — Other Ambulatory Visit: Payer: Self-pay | Admitting: Internal Medicine

## 2020-06-01 ENCOUNTER — Other Ambulatory Visit: Payer: Self-pay | Admitting: Internal Medicine

## 2020-06-06 ENCOUNTER — Other Ambulatory Visit: Payer: Self-pay

## 2020-06-06 MED ORDER — OXYCODONE-ACETAMINOPHEN 10-325 MG PO TABS
1.0000 | ORAL_TABLET | Freq: Four times a day (QID) | ORAL | 0 refills | Status: DC | PRN
Start: 2020-06-06 — End: 2020-07-05

## 2020-06-06 NOTE — Telephone Encounter (Signed)
Name of Medication: percocet 10-325 mg Name of Pharmacy: [facility] or Written Date and Quantity:# 150 on 05/06/2020  Last Office Visit and Type: 03/21/20 for 3 mth narcotic FU Next Office Visit and Type: 07/11/20 for 3 mth FU Last Controlled Substance Agreement Date: 08/29/19 Last UDS:08/24/19

## 2020-07-03 ENCOUNTER — Other Ambulatory Visit: Payer: Self-pay | Admitting: Internal Medicine

## 2020-07-05 ENCOUNTER — Other Ambulatory Visit: Payer: Self-pay

## 2020-07-05 MED ORDER — OXYCODONE-ACETAMINOPHEN 10-325 MG PO TABS: 1.0000 | ORAL_TABLET | Freq: Four times a day (QID) | ORAL | 0 refills | Status: DC | PRN

## 2020-07-05 NOTE — Telephone Encounter (Signed)
Pt states she is going out of town for the weekend  Name of Medication: percocet 10-325 mg Name of Pharmacy: [facility] or Written Date and Quantity:# 150 on 06/06/2020 Last Office Visit and Type: 03/21/20 for 3 mth narcotic FU Next Office Visit and Type: 07/11/20 for 3 mth FU Last Controlled Substance Agreement Date: 08/29/19 Last UDS:08/24/19

## 2020-07-09 ENCOUNTER — Other Ambulatory Visit: Payer: Self-pay | Admitting: Internal Medicine

## 2020-07-09 NOTE — Telephone Encounter (Signed)
Attempted to schedule.  LMOV to call office.  ° °

## 2020-07-09 NOTE — Telephone Encounter (Signed)
Please schedule F/U appointment. Patient has cancelled last five appointments and will need to F/U for refills. Thank you!

## 2020-07-10 ENCOUNTER — Other Ambulatory Visit: Payer: Self-pay | Admitting: Primary Care

## 2020-07-10 NOTE — Telephone Encounter (Signed)
To PCP

## 2020-07-11 ENCOUNTER — Ambulatory Visit (INDEPENDENT_AMBULATORY_CARE_PROVIDER_SITE_OTHER): Payer: Medicare Other | Admitting: Internal Medicine

## 2020-07-11 ENCOUNTER — Encounter: Payer: Self-pay | Admitting: Internal Medicine

## 2020-07-11 ENCOUNTER — Other Ambulatory Visit: Payer: Self-pay

## 2020-07-11 DIAGNOSIS — G8929 Other chronic pain: Secondary | ICD-10-CM | POA: Diagnosis not present

## 2020-07-11 DIAGNOSIS — F1120 Opioid dependence, uncomplicated: Secondary | ICD-10-CM

## 2020-07-11 DIAGNOSIS — I25119 Atherosclerotic heart disease of native coronary artery with unspecified angina pectoris: Secondary | ICD-10-CM | POA: Diagnosis not present

## 2020-07-11 DIAGNOSIS — E1159 Type 2 diabetes mellitus with other circulatory complications: Secondary | ICD-10-CM | POA: Diagnosis not present

## 2020-07-11 DIAGNOSIS — E1165 Type 2 diabetes mellitus with hyperglycemia: Secondary | ICD-10-CM | POA: Diagnosis not present

## 2020-07-11 DIAGNOSIS — M549 Dorsalgia, unspecified: Secondary | ICD-10-CM

## 2020-07-11 NOTE — Assessment & Plan Note (Signed)
Stable functional status Continues on the 5 oxycodone daily

## 2020-07-11 NOTE — Assessment & Plan Note (Signed)
PDMP reviewed No concerns 

## 2020-07-11 NOTE — Progress Notes (Signed)
Subjective:    Patient ID: Cynthia Singh, female    DOB: 12/15/1940, 80 y.o.   MRN: [id number]  HPI Here for follow up of chronic pain and other medical conditions This visit occurred during the SARS-CoV-2 public health emergency.  Safety protocols were in place, including screening questions prior to the visit, additional usage of staff PPE, and extensive cleaning of exam room while observing appropriate contact time as indicated for disinfecting solutions.   Pain is about the same Uses the oxycodone about the same---takes 2 at night Will take another upon awakening ~3AM Another 2 around noon Feels lazy at times--but is able to do some housework  Does note some increased urine at times--but not mostly Checks sugars ~ every other day Usually in high 100's---like 180 No low sugar reactions  No chest pain Some DOE---just rests----hasn't needed nitro  Current Outpatient Medications on File Prior to Visit  Medication Sig Dispense Refill  . aspirin EC 81 MG tablet Take 81 mg by mouth daily.    Marland Kitchen atorvastatin (LIPITOR) 80 MG tablet TAKE 1/2 TABLETS (40 MG TOTAL) BY MOUTH DAILY. 45 tablet 1  . carvedilol (COREG) 25 MG tablet Take 1 tablet (25 mg total) by mouth 2 (two) times daily with a meal. 60 tablet 6  . clopidogrel (PLAVIX) 75 MG tablet TAKE 1 TABLET BY MOUTH EVERY DAY 90 tablet 3  . Cranberry 500 MG CAPS Take 1,000 mg by mouth daily.    . empagliflozin (JARDIANCE) 25 MG TABS tablet Take 25 mg by mouth daily before breakfast. 30 tablet 5  . Insulin Pen Needle (PEN NEEDLES) 32G X 5 MM MISC 1 Units by Does not apply route daily. 100 each 3  . LANTUS SOLOSTAR 100 UNIT/ML Solostar Pen TAKE 36 UNITS DAILY UNDER SKIN 15 mL 11  . losartan-hydrochlorothiazide (HYZAAR) 100-12.5 MG tablet Take 1 tablet by mouth daily. 90 tablet 3  . metFORMIN (GLUCOPHAGE-XR) 500 MG 24 hr tablet Take 2 tablets (1,000 mg total) by mouth in the morning and at bedtime. 360 tablet 3  . nitroGLYCERIN  (NITROSTAT) 0.4 MG SL tablet Place 1 tablet (0.4 mg total) under the tongue every 5 (five) minutes as needed for chest pain. (Patient taking differently: Place 0.4 mg under the tongue daily. For arm/back pain) 30 tablet 0  . nortriptyline (PAMELOR) 50 MG capsule TAKE 3 CAPSULES (150 MG TOTAL) BY MOUTH AT BEDTIME. 270 capsule 3  . Omega-3 Fatty Acids (FISH OIL PO) Take 1 capsule by mouth daily.    Marland Kitchen oxyCODONE-acetaminophen (PERCOCET) 10-325 MG tablet Take 1-2 tablets by mouth every 6 (six) hours as needed for pain. M54.9 150 tablet 0  . polyethylene glycol (MIRALAX / GLYCOLAX) packet Take 17 g by mouth daily as needed.     Marland Kitchen amLODipine (NORVASC) 2.5 MG tablet Take 1 tablet (2.5 mg total) by mouth daily. 90 tablet 3   No current facility-administered medications on file prior to visit.    No Known Allergies  Past Medical History:  Diagnosis Date  . CAD (coronary artery disease)    a. Remote nonobstructive disease but in 10/2012 Cath/PCI: s/p DES to RCA. b. cath 02/08/15 DES to prox LAD and balloon angioplasty of ost D1, EF 55-65%; c. 08/2019 PCI/DES prox/distal RCA (overlapping prev RCA stent). Prev placed RCA/LAD stents patent.  . Concussion    [place] AFTER FALL 3/18  . Depression   . Diabetes mellitus, type 2 ([place])   . Dyslipidemia   . Episodic mood disorder ([place])   .  Hypertension   . Myocardial infarction ([place])    2014  . Obesity   . Osteoarthritis of spine    knees also  . Peripheral vascular disease, unspecified ([place]) 2020   severe disease on home screening by insurance    Past Surgical History:  Procedure Laterality Date  . ABDOMINAL HYSTERECTOMY    . APPENDECTOMY  1959  . CARDIAC CATHETERIZATION N/A 02/08/2015   Procedure: Left Heart Cath and Coronary Angiography;  Surgeon: Sherren Mocha, MD;  Location: [place] [facility];  Service: Cardiovascular;  Laterality: N/A;  . CARDIAC CATHETERIZATION N/A 02/08/2015   Procedure: Coronary Stent Intervention;  Surgeon: Sherren Mocha, MD;   Location: [place] [facility];  Service: Cardiovascular;  Laterality: N/A;  . CARDIAC CATHETERIZATION N/A 02/08/2015   Procedure: Intravascular Pressure Wire/FFR Study;  Surgeon: Sherren Mocha, MD;  Location: [place] [facility];  Service: Cardiovascular;  Laterality: N/A;  . CATARACT EXTRACTION W/PHACO Left 11/10/2016   Procedure: CATARACT EXTRACTION PHACO AND INTRAOCULAR LENS PLACEMENT ([place]) suture placed in left eye at end of procedure;  Surgeon: Birder Robson, MD;  Location: [facility];  Service: Ophthalmology;  Laterality: Left;  [place]  3.20 AP% 27.7 CDE 55.63 Fluid pack lot # [id number]  . CHOLECYSTECTOMY    . COMBINED HYSTERECTOMY ABDOMINAL W/ A&P REPAIR / OOPHORECTOMY  1968  . CORONARY ANGIOPLASTY     STENTS X 5  . CORONARY STENT INTERVENTION N/A 09/19/2019   Procedure: CORONARY STENT INTERVENTION;  Surgeon: Nelva Bush, MD;  Location: [place] [facility];  Service: Cardiovascular;  Laterality: N/A;  . CORONARY STENT PLACEMENT  2014  . DILATION AND CURETTAGE OF UTERUS    . [place]  . LEFT HEART CATH AND CORONARY ANGIOGRAPHY Left 09/19/2019   Procedure: LEFT HEART CATH AND CORONARY ANGIOGRAPHY;  Surgeon: Nelva Bush, MD;  Location: [place] [facility];  Service: Cardiovascular;  Laterality: Left;  . LEFT HEART CATHETERIZATION WITH CORONARY ANGIOGRAM N/A 10/20/2012   Procedure: LEFT HEART CATHETERIZATION WITH CORONARY ANGIOGRAM;  Surgeon: Sherren Mocha, MD;  Location: [facility] C[facility];  Service: Cardiovascular;  Laterality: N/A;  . multiple D&C    . TONSILLECTOMY  age 8    Family History  Adopted: Yes  Problem Relation Age of Onset  . Cancer Brother        Colon  . Coronary artery disease Neg Hx   . Heart attack Neg Hx     Social History   Socioeconomic History  . Marital status: Widowed    Spouse name: Not on file  . Number of children: 3  . Years of education: 37  . Highest education level: Not on file  Occupational History  . Occupation: Scientist, water quality   Tobacco Use  . Smoking status: Never Smoker  . Smokeless tobacco: Never Used  Vaping Use  . Vaping Use: Never used  Substance and Sexual Activity  . Alcohol use: No  . Drug use: No  . Sexual activity: Never    Birth control/protection: Post-menopausal  Other Topics Concern  . Not on file  Social History Narrative   Widowed '13. 2 sons- '63, '66; one daughter-'69;    56 grandchildren--has raised 9 of her grandchildren 4 great grandchildren.   Lives with 2 granddaughters and adoptive son      Has living will   Probably would have son Jacia Sickman as health care POA--not set up   Would accept resuscitation but no prolonged ventilation   Not sure about tube feeds   Social  Determinants of Health   Financial Resource Strain: Medium Risk  . Difficulty of Paying Living Expenses: Somewhat hard  Food Insecurity: Not on file  Transportation Needs: Not on file  Physical Activity: Not on file  Stress: Not on file  Social Connections: Not on file  Intimate Partner Violence: Not on file   Review of Systems Appetite is fine Weight stable Sleep is variable--will have frequent awakening some nights    Objective:   Physical Exam Constitutional:      Appearance: Normal appearance.  Cardiovascular:     Rate and Rhythm: Normal rate and regular rhythm.     Heart sounds: No murmur heard. No gallop.      Comments: Faint pedal pulses Pulmonary:     Effort: Pulmonary effort is normal.     Breath sounds: Normal breath sounds. No wheezing or rales.  Musculoskeletal:     Cervical back: Neck supple.     Right lower leg: No edema.     Left lower leg: No edema.  Lymphadenopathy:     Cervical: No cervical adenopathy.  Neurological:     Mental Status: She is alert.            Assessment & Plan:

## 2020-07-11 NOTE — Assessment & Plan Note (Addendum)
Sugars better at home Lab Results  Component Value Date   HGBA1C 8.2 (A) 03/21/2020   Still not optimal on max metformin and jardiance lantus 36 units daily Will hold off on new meds due to her age

## 2020-07-11 NOTE — Telephone Encounter (Signed)
Confirm that she is off this---it was for preventative after a brain bleed and I believe I did tell her to stop it a while back.

## 2020-07-11 NOTE — Assessment & Plan Note (Signed)
Stable DOE Hasn't needed nitro On losartan, statin, plavix, ASA, carvedilol

## 2020-07-18 ENCOUNTER — Ambulatory Visit: Payer: Medicare Other | Admitting: Family

## 2020-08-01 ENCOUNTER — Ambulatory Visit: Payer: Medicare Other | Admitting: Family

## 2020-08-02 ENCOUNTER — Other Ambulatory Visit: Payer: Self-pay

## 2020-08-02 ENCOUNTER — Encounter: Payer: Self-pay | Admitting: Family

## 2020-08-02 MED ORDER — OXYCODONE-ACETAMINOPHEN 10-325 MG PO TABS: 1.0000 | ORAL_TABLET | Freq: Four times a day (QID) | ORAL | 0 refills | Status: DC | PRN

## 2020-08-02 NOTE — Telephone Encounter (Signed)
Name of Medication: percocet 10-325 mg Name of [facility]acy[facility] or Written Date and Quantity: # 150 on 07/05/20 Last Office Visit and Type: 07/11/20 for 3 mth FU Next Office Visit and Type: 10/10/20 for 3 mth FU Last Controlled Substance Agreement Date: 08/29/19 Last UDS:08/24/19

## 2020-09-02 ENCOUNTER — Other Ambulatory Visit: Payer: Self-pay

## 2020-09-02 MED ORDER — OXYCODONE-ACETAMINOPHEN 10-325 MG PO TABS: 1.0000 | ORAL_TABLET | Freq: Four times a day (QID) | ORAL | 0 refills | Status: DC | PRN

## 2020-09-02 NOTE — Telephone Encounter (Signed)
Pharmacy requests refill on: Oxycodone-Acetaminophen 10-325 mg   LAST REFILL: 08/02/2020 (Q-150, R-0) LAST OV: 07/11/2020 NEXT OV: 10/10/2020 PH[facility]ACY: [id number] [place], [place] UDS & Contract: 08/24/2019

## 2020-09-25 ENCOUNTER — Telehealth: Payer: Self-pay

## 2020-09-25 NOTE — Chronic Care Management (AMB) (Addendum)
Chronic Care Management Pharmacy Assistant   Name: Cynthia Singh  MRN: [id number] DOB: 06-Sep-1940   Reason for Encounter: Disease State   Conditions to be addressed/monitored: HTN and DMII  Recent office visits:  07/11/2020  Dr.Letvak  PCP - No new medication changes. F/u 3 months.  Recent consult visits:  None since last CCM contact  Hospital visits:  None in previous 6 months  Medications: Outpatient Encounter Medications as of 09/25/2020  Medication Sig   amLODipine (NORVASC) 2.5 MG tablet Take 1 tablet (2.5 mg total) by mouth daily.   aspirin EC 81 MG tablet Take 81 mg by mouth daily.   atorvastatin (LIPITOR) 80 MG tablet TAKE 1/2 TABLETS (40 MG TOTAL) BY MOUTH DAILY.   carvedilol (COREG) 25 MG tablet Take 1 tablet (25 mg total) by mouth 2 (two) times daily with a meal.   clopidogrel (PLAVIX) 75 MG tablet TAKE 1 TABLET BY MOUTH EVERY DAY   Cranberry 500 MG CAPS Take 1,000 mg by mouth daily.   empagliflozin (JARDIANCE) 25 MG TABS tablet Take 25 mg by mouth daily before breakfast.   Insulin Pen Needle (PEN NEEDLES) 32G X 5 MM MISC 1 Units by Does not apply route daily.   LANTUS SOLOSTAR 100 UNIT/ML Solostar Pen TAKE 36 UNITS DAILY UNDER SKIN   losartan-hydrochlorothiazide (HYZAAR) 100-12.5 MG tablet Take 1 tablet by mouth daily.   metFORMIN (GLUCOPHAGE-XR) 500 MG 24 hr tablet Take 2 tablets (1,000 mg total) by mouth in the morning and at bedtime.   nitroGLYCERIN (NITROSTAT) 0.4 MG SL tablet Place 1 tablet (0.4 mg total) under the tongue every 5 (five) minutes as needed for chest pain. (Patient taking differently: Place 0.4 mg under the tongue daily. For arm/back pain)   nortriptyline (PAMELOR) 50 MG capsule TAKE 3 CAPSULES (150 MG TOTAL) BY MOUTH AT BEDTIME.   Omega-3 Fatty Acids (FISH OIL PO) Take 1 capsule by mouth daily.   oxyCODONE-acetaminophen (PERCOCET) 10-325 MG tablet Take 1-2 tablets by mouth every 6 (six) hours as needed for pain. M54.9   polyethylene glycol  (MIRALAX / GLYCOLAX) packet Take 17 g by mouth daily as needed.    No facility-administered encounter medications on file as of 09/25/2020.    Recent Relevant Labs: Lab Results  Component Value Date/Time   HGBA1C 8.2 (A) 03/21/2020 03:38 PM   HGBA1C 9.5 (A) 11/23/2019 03:15 PM   HGBA1C 13.8 (H) 05/24/2019 12:01 PM   HGBA1C 11.6 (H) 10/19/2017 10:51 AM    Kidney Function Lab Results  Component Value Date/Time   CREATININE 0.86 09/20/2019 06:18 AM   CREATININE 0.72 09/05/2019 05:49 AM   GFR 67.77 05/24/2019 12:01 PM   GFRNONAA >60 09/20/2019 06:18 AM   GFRAA >60 09/20/2019 06:18 AM   Current antihyperglycemic regimen:  Metformin 500 mg - 2 tablets twice daily with breakfast and supper Lantus - Inject 36 units every evening  Jardiance 25 mg - 1 tablet daily   Patient verbally confirms she is taking the above medications as directed? Yes the patient confirmed medication dose  What recent interventions/DTPs have been made to improve glycemic control: none identified, but the patient reports she eats beets  Have there been any recent hospitalizations or ED visits since last visit with CPP? No  Patient denies hypoglycemic symptoms, including Pale, Sweaty, Shaky, Hungry, Nervous/irritable and Vision changes  Patient denies hyperglycemic symptoms, including blurry vision, excessive thirst, fatigue, polyuria and weakness  How often are you checking your blood sugar?  The patient reports  not often to taking her BG, she states 180 range.  On insulin? Yes  Lantus - Inject 36 units every evening   During the week, how often does your blood glucose drop below 70? Never  Are you checking your feet daily/regularly? Yes  Adherence Review: Is the patient currently on a STATIN medication? No Is the patient currently on ACE/ARB medication? No Does the patient have >5 day gap between last estimated fill dates? CPP to review  Star Rating Drugs:  Medication:  Last Fill: Day  Supply Jardiance 25mg   01/12/2019 30ds Metformin 500mg   07/28/2020 90ds  Recent Office Vitals: BP Readings from Last 3 Encounters:  07/11/20 140/76  03/21/20 128/84  11/23/19 126/78   Pulse Readings from Last 3 Encounters:  07/11/20 91  03/21/20 69  11/23/19 84    Wt Readings from Last 3 Encounters:  07/11/20 191 lb 12 oz (87 kg)  03/21/20 191 lb (86.6 kg)  11/23/19 192 lb (87.1 kg)     Kidney Function Lab Results  Component Value Date/Time   CREATININE 0.86 09/20/2019 06:18 AM   CREATININE 0.72 09/05/2019 05:49 AM   GFR 67.77 05/24/2019 12:01 PM   GFRNONAA >60 09/20/2019 06:18 AM   GFRAA >60 09/20/2019 06:18 AM    BMP Latest Ref Rng & Units 09/20/2019 09/05/2019 09/04/2019  Glucose 70 - 99 mg/dL 235(H) 224(H) 369(H)  BUN 8 - 23 mg/dL 15 10 10   Creatinine 0.44 - 1.00 mg/dL 0.86 0.72 0.70  Sodium 135 - 145 mmol/L 134(L) 138 137  Potassium 3.5 - 5.1 mmol/L 4.1 4.0 3.9  Chloride 98 - 111 mmol/L 101 102 105  CO2 22 - 32 mmol/L 24 28 24   Calcium 8.9 - 10.3 mg/dL 8.9 9.5 8.6(L)    Current antihypertensive regimen:  Amlodipine 2.5 mg - 1 tablet daily Carvedilol 25 mg - 1 tablet twice daily Losartan-HCTZ 100-12.5 mg - 1 tablet daily  Patient verbally confirms she is taking the above medications as directed. Yes  How often are you checking your Blood Pressure?  never , the patient reports she does not have a BP monitor.  Caffeine intake: drinks diet soda Salt intake: never adds to food OTC medications including pseudoephedrine or NSAIDs?no  What recent interventions/DTPs have been made by any provider to improve Blood Pressure control since last CPP Visit:  None identified  Any recent hospitalizations or ED visits since last visit with CPP? No  What diet changes have been made to improve Blood Pressure Control?  Drinks diet soda, avoids salt, she eats beets  What exercise is being done to improve your Blood Pressure Control?  The patient reports she has fair skin and  avoids the outside for any activity due sun and heat exposure  Adherence Review: Is the patient currently on ACE/ARB medication? Yes - losartan-HCTZ  The patient was reminded of her f/u with Dr. Silvio Pate on 10/10/2020.  Follow-Up:  Pharmacist Review  Debbora Dus, CPP notified  CPA Time: 104 mins Avel Sensor, [place] Assistant [phone]  I have reviewed the care management and care coordination activities outlined in this encounter and I am certifying that I agree with the content of this note. Unable to view medication refill data to confirm. See addendum.  CPP Time: 12 min Debbora Dus, PharmD Clinical Pharmacist [facility] [phone]

## 2020-10-02 ENCOUNTER — Other Ambulatory Visit: Payer: Self-pay

## 2020-10-02 ENCOUNTER — Other Ambulatory Visit: Payer: Self-pay | Admitting: Internal Medicine

## 2020-10-02 MED ORDER — OXYCODONE-ACETAMINOPHEN 10-325 MG PO TABS: 1.0000 | ORAL_TABLET | Freq: Four times a day (QID) | ORAL | 0 refills | Status: DC | PRN

## 2020-10-02 NOTE — Telephone Encounter (Signed)
Name of Medication: oxycodone apap 10-325 mg Name of Pharmacy: [facility] or Written Date and Quantity: # 150 on 09/02/20 Last Office Visit and Type: 07/11/20 for 3 mth FU Next Office Visit and Type:10/10/20 for 3 mth FU  Last Controlled Substance Agreement Date: 08/29/19 Last UDS:08/24/19

## 2020-10-04 NOTE — Progress Notes (Addendum)
Called the patient and discussed with the patient to take BG's each morning fasting and write this down each morning and take her BG log with her to her appointment with Dr.Letvak 10/10/20 at 2:30pm.The patient agreed. Debbora Dus, CPP notified  CMA Time: 8 min. Avel Sensor, [place] Assistant [phone]

## 2020-10-04 NOTE — Telephone Encounter (Signed)
Please ask patient to check fasting BG (before first meal of day) daily until PCP visit and bring a written log to the appointment.

## 2020-10-10 ENCOUNTER — Ambulatory Visit: Payer: Medicare Other | Admitting: Internal Medicine

## 2020-10-17 ENCOUNTER — Encounter: Payer: Self-pay | Admitting: Internal Medicine

## 2020-10-17 ENCOUNTER — Ambulatory Visit (INDEPENDENT_AMBULATORY_CARE_PROVIDER_SITE_OTHER): Payer: Medicare Other | Admitting: Internal Medicine

## 2020-10-17 ENCOUNTER — Other Ambulatory Visit: Payer: Self-pay

## 2020-10-17 VITALS — BP 118/74 | HR 78 | Temp 97.8°F | Ht 68.0 in | Wt 195.0 lb

## 2020-10-17 DIAGNOSIS — F39 Unspecified mood [affective] disorder: Secondary | ICD-10-CM | POA: Diagnosis not present

## 2020-10-17 DIAGNOSIS — E1159 Type 2 diabetes mellitus with other circulatory complications: Secondary | ICD-10-CM

## 2020-10-17 DIAGNOSIS — M549 Dorsalgia, unspecified: Secondary | ICD-10-CM | POA: Diagnosis not present

## 2020-10-17 DIAGNOSIS — G8929 Other chronic pain: Secondary | ICD-10-CM | POA: Diagnosis not present

## 2020-10-17 DIAGNOSIS — E1165 Type 2 diabetes mellitus with hyperglycemia: Secondary | ICD-10-CM | POA: Diagnosis not present

## 2020-10-17 DIAGNOSIS — F1120 Opioid dependence, uncomplicated: Secondary | ICD-10-CM

## 2020-10-17 LAB — POCT GLYCOSYLATED HEMOGLOBIN (HGB A1C): Hemoglobin A1C: 8.1 % — AB (ref 4.0–5.6)

## 2020-10-17 MED ORDER — DULOXETINE HCL 30 MG PO CPEP: 30.0000 mg | ORAL_CAPSULE | Freq: Every day | ORAL | 3 refills | Status: DC

## 2020-10-17 NOTE — Progress Notes (Signed)
Subjective:    Patient ID: Cynthia Singh, female    DOB: [birth year], 80 y.o.   MRN: [id number]  HPI Here for diabetes and chronic pain follow up This visit occurred during the SARS-CoV-2 public health emergency.  Safety protocols were in place, including screening questions prior to the visit, additional usage of staff PPE, and extensive cleaning of exam room while observing appropriate contact time as indicated for disinfecting solutions.   No recent heart problems No chest pain No SOB Slight edema--"not that bad" No syncope. Occasional dizzy spells  Feeling anxious ---mostly related to her son's death (5 months ago) Does feel depressed--more than just the grieving  Hasn't been checking sugars that regularly Usually 170-180's random  No low sugar reactions  Pain is about the same  Takes oxycodone two during the day, 2 at night and another in the middle of the night Still not sleeping great  Current Outpatient Medications on File Prior to Visit  Medication Sig Dispense Refill  . amLODipine (NORVASC) 2.5 MG tablet TAKE 1 TABLET BY MOUTH EVERY DAY 30 tablet 0  . aspirin EC 81 MG tablet Take 81 mg by mouth daily.    Marland Kitchen atorvastatin (LIPITOR) 80 MG tablet TAKE 1/2 TABLETS (40 MG TOTAL) BY MOUTH DAILY. 45 tablet 1  . carvedilol (COREG) 25 MG tablet Take 1 tablet (25 mg total) by mouth 2 (two) times daily with a meal. 60 tablet 6  . clopidogrel (PLAVIX) 75 MG tablet TAKE 1 TABLET BY MOUTH EVERY DAY 90 tablet 3  . Cranberry 500 MG CAPS Take 1,000 mg by mouth daily.    . empagliflozin (JARDIANCE) 25 MG TABS tablet Take 25 mg by mouth daily before breakfast. 30 tablet 5  . Insulin Pen Needle (PEN NEEDLES) 32G X 5 MM MISC 1 Units by Does not apply route daily. 100 each 3  . LANTUS SOLOSTAR 100 UNIT/ML Solostar Pen TAKE 36 UNITS DAILY UNDER SKIN 15 mL 11  . losartan-hydrochlorothiazide (HYZAAR) 100-12.5 MG tablet Take 1 tablet by mouth daily. 90 tablet 3  . metFORMIN (GLUCOPHAGE-XR)  500 MG 24 hr tablet Take 2 tablets (1,000 mg total) by mouth in the morning and at bedtime. 360 tablet 3  . nitroGLYCERIN (NITROSTAT) 0.4 MG SL tablet Place 1 tablet (0.4 mg total) under the tongue every 5 (five) minutes as needed for chest pain. (Patient taking differently: Place 0.4 mg under the tongue daily. For arm/back pain) 30 tablet 0  . nortriptyline (PAMELOR) 50 MG capsule TAKE 3 CAPSULES (150 MG TOTAL) BY MOUTH AT BEDTIME. 270 capsule 3  . Omega-3 Fatty Acids (FISH OIL PO) Take 1 capsule by mouth daily.    Marland Kitchen oxyCODONE-acetaminophen (PERCOCET) 10-325 MG tablet Take 1-2 tablets by mouth every 6 (six) hours as needed for pain. M54.9 150 tablet 0  . polyethylene glycol (MIRALAX / GLYCOLAX) packet Take 17 g by mouth daily as needed.      No current facility-administered medications on file prior to visit.    No Known Allergies  Past Medical History:  Diagnosis Date  . CAD (coronary artery disease)    a. Remote nonobstructive disease but in 10/2012 Cath/PCI: s/p DES to RCA. b. cath 02/08/15 DES to prox LAD and balloon angioplasty of ost D1, EF 55-65%; c. 08/2019 PCI/DES prox/distal RCA (overlapping prev RCA stent). Prev placed RCA/LAD stents patent.  . Concussion    [place] AFTER FALL 3/18  . Depression   . Diabetes mellitus, type 2 ([place])   . Dyslipidemia   .  Episodic mood disorder ([place])   . Hypertension   . Myocardial infarction ([place])    2014  . Obesity   . Osteoarthritis of spine    knees also  . Peripheral vascular disease, unspecified ([place]) 2020   severe disease on home screening by insurance    Past Surgical History:  Procedure Laterality Date  . ABDOMINAL HYSTERECTOMY    . APPENDECTOMY  1959  . CARDIAC CATHETERIZATION N/A 02/08/2015   Procedure: Left Heart Cath and Coronary Angiography;  Surgeon: Sherren Mocha, MD;  Location: [place] [facility];  Service: Cardiovascular;  Laterality: N/A;  . CARDIAC CATHETERIZATION N/A 02/08/2015   Procedure: Coronary Stent Intervention;   Surgeon: Sherren Mocha, MD;  Location: [place];  Service: Cardiovascular;  Laterality: N/A;  . CARDIAC CATHETERIZATION N/A 02/08/2015   Procedure: Intravascular Pressure Wire/FFR Study;  Surgeon: Sherren Mocha, MD;  Location: [place] [facility];  Service: Cardiovascular;  Laterality: N/A;  . CATARACT EXTRACTION W/PHACO Left 11/10/2016   Procedure: CATARACT EXTRACTION PHACO AND INTRAOCULAR LENS PLACEMENT ([place]) suture placed in left eye at end of procedure;  Surgeon: Birder Robson, MD;  Location: [facility];  Service: Ophthalmology;  Laterality: Left;  [place]  3.20 AP% 27.7 CDE 55.63 Fluid pack lot # [id number]  . CHOLECYSTECTOMY    . COMBINED HYSTERECTOMY ABDOMINAL W/ A&P REPAIR / OOPHORECTOMY  1968  . CORONARY ANGIOPLASTY     STENTS X 5  . CORONARY STENT INTERVENTION N/A 09/19/2019   Procedure: CORONARY STENT INTERVENTION;  Surgeon: Nelva Bush, MD;  Location: [place] [facility];  Service: Cardiovascular;  Laterality: N/A;  . CORONARY STENT PLACEMENT  2014  . DILATION AND CURETTAGE OF UTERUS    . [place]  . LEFT HEART CATH AND CORONARY ANGIOGRAPHY Left 09/19/2019   Procedure: LEFT HEART CATH AND CORONARY ANGIOGRAPHY;  Surgeon: Nelva Bush, MD;  Location: [place] [facility];  Service: Cardiovascular;  Laterality: Left;  . LEFT HEART CATHETERIZATION WITH CORONARY ANGIOGRAM N/A 10/20/2012   Procedure: LEFT HEART CATHETERIZATION WITH CORONARY ANGIOGRAM;  Surgeon: Sherren Mocha, MD;  Location: [facility] C[facility] LAB;  Service: Cardiovascular;  Laterality: N/A;  . multiple D&C    . TONSILLECTOMY  age 52    Family History  Adopted: Yes  Problem Relation Age of Onset  . Cancer Brother        Colon  . Coronary artery disease Neg Hx   . Heart attack Neg Hx     Social History   Socioeconomic History  . Marital status: Widowed    Spouse name: Not on file  . Number of children: 3  . Years of education: 52  . Highest education level: Not on file  Occupational History   . Occupation: Scientist, water quality  Tobacco Use  . Smoking status: Never Smoker  . Smokeless tobacco: Never Used  Vaping Use  . Vaping Use: Never used  Substance and Sexual Activity  . Alcohol use: No  . Drug use: No  . Sexual activity: Never    Birth control/protection: Post-menopausal  Other Topics Concern  . Not on file  Social History Narrative   Widowed '13. 2 sons- '63, '66; one daughter-'69;    68 grandchildren--has raised 9 of her grandchildren 4 great grandchildren.   Lives with 2 granddaughters and adoptive son      Has living will   Probably would have son Ashiya Kinkead as health care POA--not set up   Would accept resuscitation but no prolonged ventilation   Not  sure about tube feeds   Social Determinants of Health   Financial Resource Strain: Not on file  Food Insecurity: Not on file  Transportation Needs: Not on file  Physical Activity: Not on file  Stress: Not on file  Social Connections: Not on file  Intimate Partner Violence: Not on file   Review of Systems  Appetite is not good Weight is up slightly (?eating the wrong things)    Objective:   Physical Exam Constitutional:      Appearance: Normal appearance.  Cardiovascular:     Rate and Rhythm: Normal rate and regular rhythm.     Heart sounds: No murmur heard. No gallop.      Comments: Faint pedal pulses Pulmonary:     Effort: Pulmonary effort is normal.     Breath sounds: Normal breath sounds. No wheezing or rales.  Musculoskeletal:     Cervical back: Neck supple.     Right lower leg: No edema.     Left lower leg: No edema.  Lymphadenopathy:     Cervical: No cervical adenopathy.  Skin:    Comments: No foot lesions  Neurological:     Mental Status: She is alert.  Psychiatric:     Comments: Depressed today Slightly constricted affect            Assessment & Plan:

## 2020-10-17 NOTE — Assessment & Plan Note (Signed)
Lab Results  Component Value Date   HGBA1C 8.1 (A) 10/17/2020   Control stable and acceptable at her age On jardiance, lantus, metformin

## 2020-10-17 NOTE — Assessment & Plan Note (Addendum)
Continues on the oxycodone and nortriptyline Duloxetine may help this as well

## 2020-10-17 NOTE — Assessment & Plan Note (Signed)
Having regular depressed mood---mild MDD Ready to try something (though likely a big component of grieving) Low dose duloxetine

## 2020-10-17 NOTE — Assessment & Plan Note (Signed)
PDMP reviewed-no problems Will check UDS

## 2020-10-18 LAB — CBC
HCT: 39.5 % (ref 36.0–46.0)
Hemoglobin: 13.7 g/dL (ref 12.0–15.0)
MCHC: 34.6 g/dL (ref 30.0–36.0)
MCV: 89 fl (ref 78.0–100.0)
Platelets: 277 10*3/uL (ref 150.0–400.0)
RBC: 4.44 Mil/uL (ref 3.87–5.11)
RDW: 13.4 % (ref 11.5–15.5)
WBC: 10.1 10*3/uL (ref 4.0–10.5)

## 2020-10-18 LAB — T4, FREE: Free T4: 0.87 ng/dL (ref 0.60–1.60)

## 2020-10-18 MED ORDER — DULOXETINE HCL 30 MG PO CPEP: 30.0000 mg | ORAL_CAPSULE | Freq: Every day | ORAL | 3 refills | Status: DC

## 2020-10-18 NOTE — Addendum Note (Signed)
Addended by: Pilar Grammes on: 10/18/2020 10:55 AM   Modules accepted: Orders

## 2020-10-18 NOTE — Addendum Note (Signed)
Addended by: Pilar Grammes on: 10/18/2020 10:47 AM   Modules accepted: Orders

## 2020-10-20 ENCOUNTER — Other Ambulatory Visit: Payer: Self-pay | Admitting: Internal Medicine

## 2020-10-20 LAB — DRUG MONITORING, PANEL 8 WITH CONFIRMATION, URINE
6 Acetylmorphine: NEGATIVE ng/mL (ref <10)
Alcohol Metabolites: NEGATIVE ng/mL
Amphetamines: NEGATIVE ng/mL (ref <500)
Benzodiazepines: NEGATIVE ng/mL (ref <100)
Buprenorphine, Urine: NEGATIVE ng/mL (ref <5)
Cocaine Metabolite: NEGATIVE ng/mL (ref <150)
Codeine: NEGATIVE ng/mL (ref <50)
Creatinine: 109.9 mg/dL
Hydrocodone: NEGATIVE ng/mL (ref <50)
Hydromorphone: NEGATIVE ng/mL (ref <50)
MDMA: NEGATIVE ng/mL (ref <500)
Marijuana Metabolite: NEGATIVE ng/mL (ref <20)
Morphine: NEGATIVE ng/mL (ref <50)
Norhydrocodone: NEGATIVE ng/mL (ref <50)
Noroxycodone: >10000 ng/mL — ABNORMAL HIGH (ref <50)
Opiates: NEGATIVE ng/mL (ref <100)
Oxidant: NEGATIVE ug/mL
Oxycodone: 7947 ng/mL — ABNORMAL HIGH (ref <50)
Oxycodone: POSITIVE ng/mL — AB (ref <100)
Oxymorphone: 3240 ng/mL — ABNORMAL HIGH (ref <50)
pH: 5.4 (ref 4.5–9.0)

## 2020-10-20 LAB — DM TEMPLATE

## 2020-10-21 LAB — LIPID PANEL
Cholesterol: 199 mg/dL (ref 0–200)
HDL: 36.3 mg/dL — ABNORMAL LOW (ref >39.00)
Total CHOL/HDL Ratio: 5
Triglycerides: 834 mg/dL — ABNORMAL HIGH (ref 0.0–149.0)

## 2020-10-21 LAB — COMPREHENSIVE METABOLIC PANEL
ALT: 17 U/L (ref 0–35)
AST: 18 U/L (ref 0–37)
Albumin: 4.3 g/dL (ref 3.5–5.2)
Alkaline Phosphatase: 79 U/L (ref 39–117)
BUN: 14 mg/dL (ref 6–23)
CO2: 25 mEq/L (ref 19–32)
Calcium: 9.3 mg/dL (ref 8.4–10.5)
Chloride: 99 mEq/L (ref 96–112)
Creatinine, Ser: 0.86 mg/dL (ref 0.40–1.20)
GFR: 65.15 mL/min (ref >60.00)
Glucose, Bld: 217 mg/dL — ABNORMAL HIGH (ref 70–99)
Potassium: 4.3 mEq/L (ref 3.5–5.1)
Sodium: 136 mEq/L (ref 135–145)
Total Bilirubin: 0.2 mg/dL (ref 0.2–1.2)
Total Protein: 6.9 g/dL (ref 6.0–8.3)

## 2020-10-21 LAB — LDL CHOLESTEROL, DIRECT: Direct LDL: 88 mg/dL

## 2020-11-01 ENCOUNTER — Other Ambulatory Visit: Payer: Self-pay

## 2020-11-01 MED ORDER — OXYCODONE-ACETAMINOPHEN 10-325 MG PO TABS: 1.0000 | ORAL_TABLET | Freq: Four times a day (QID) | ORAL | 0 refills | Status: DC | PRN

## 2020-11-01 NOTE — Telephone Encounter (Signed)
Pt left VM on triage requesting refill.

## 2020-11-01 NOTE — Telephone Encounter (Signed)
Name of Medication: Oxycodone Name of [facility]acy[facility] Last Fill or Written Date and Quantity: 10-02-20 #150 Last Office Visit and Type: 10-17-20 Next Office Visit and Type: 01-15-21 Last Controlled Substance Agreement Date: 10-17-20 Last UDS: 10-17-20

## 2020-11-12 ENCOUNTER — Other Ambulatory Visit: Payer: Self-pay | Admitting: Internal Medicine

## 2020-11-27 ENCOUNTER — Telehealth: Payer: Self-pay

## 2020-11-27 NOTE — Chronic Care Management (AMB) (Addendum)
    Chronic Care Management Pharmacy Assistant   Name: Cynthia Singh  MRN: [id number] DOB: [birth year]  Reason for Encounter: CCM Reminder Call   Conditions to be addressed/monitored: CAD, HTN, HLD, and DMII  Medications: Outpatient Encounter Medications as of 11/27/2020  Medication Sig   amLODipine (NORVASC) 2.5 MG tablet TAKE 1 TABLET BY MOUTH EVERY DAY   aspirin EC 81 MG tablet Take 81 mg by mouth daily.   atorvastatin (LIPITOR) 80 MG tablet TAKE 1/2 TABLETS (40 MG TOTAL) BY MOUTH DAILY.   carvedilol (COREG) 25 MG tablet Take 1 tablet (25 mg total) by mouth 2 (two) times daily with a meal.   clopidogrel (PLAVIX) 75 MG tablet TAKE 1 TABLET BY MOUTH EVERY DAY   Cranberry 500 MG CAPS Take 1,000 mg by mouth daily.   DULoxetine (CYMBALTA) 30 MG capsule TAKE 1 CAPSULE BY MOUTH EVERY DAY   empagliflozin (JARDIANCE) 25 MG TABS tablet Take 25 mg by mouth daily before breakfast.   Insulin Pen Needle (PEN NEEDLES) 32G X 5 MM MISC 1 Units by Does not apply route daily.   LANTUS SOLOSTAR 100 UNIT/ML Solostar Pen TAKE 36 UNITS DAILY UNDER SKIN   losartan-hydrochlorothiazide (HYZAAR) 100-12.5 MG tablet Take 1 tablet by mouth daily.   metFORMIN (GLUCOPHAGE-XR) 500 MG 24 hr tablet Take 2 tablets (1,000 mg total) by mouth in the morning and at bedtime.   nitroGLYCERIN (NITROSTAT) 0.4 MG SL tablet Place 1 tablet (0.4 mg total) under the tongue every 5 (five) minutes as needed for chest pain. (Patient taking differently: Place 0.4 mg under the tongue daily. For arm/back pain)   nortriptyline (PAMELOR) 50 MG capsule TAKE 3 CAPSULES (150 MG TOTAL) BY MOUTH AT BEDTIME.   Omega-3 Fatty Acids (FISH OIL PO) Take 1 capsule by mouth daily.   oxyCODONE-acetaminophen (PERCOCET) 10-325 MG tablet Take 1-2 tablets by mouth every 6 (six) hours as needed for pain. M54.9   polyethylene glycol (MIRALAX / GLYCOLAX) packet Take 17 g by mouth daily as needed.    No facility-administered encounter medications on file as  of 11/27/2020.   Cynthia Singh was contacted to remind her of her upcoming  visit with Debbora Dus on 12/04/2020 at 1:00 PM.  She was reminded to have all medications, supplements and any blood glucose and blood pressure readings available for review at appointment.  Are you having any problems with your medications? [facility] to [facility]   What concerns would you like to discuss with the pharmacist? None at this time per the patient everything is going well. She states she has missed a few doses of her medications. Unable to tell me which ones so I discussed importance of having all medications available for review at appointment.  Star Rating Drugs: Medication:   Last Fill: Day Supply Atorvastatin 80mg   05/19/20  90 Lantus    11/10/20 42 Metformin XR 500mg  10/28/20 90  PCP appointment on 01/15/2021 and CCM appointment on 12/04/2020  Debbora Dus, CPP notified  Avel Sensor, [place] Assistant [phone]  I have reviewed the care management and care coordination activities outlined in this encounter and I am certifying that I agree with the content of this note. No further action required.  Debbora Dus, PharmD Clinical Pharmacist [facility] at [facility] [phone]

## 2020-11-29 ENCOUNTER — Other Ambulatory Visit: Payer: Self-pay | Admitting: *Deleted

## 2020-11-29 MED ORDER — OXYCODONE-ACETAMINOPHEN 10-325 MG PO TABS: 1.0000 | ORAL_TABLET | Freq: Four times a day (QID) | ORAL | 0 refills | Status: DC | PRN

## 2020-11-29 NOTE — Telephone Encounter (Signed)
Last office visit 10/17/20 for Pain management/DM.  Last refilled 11/01/2020 for #150 with no refills.  Next Appt: 01/15/2021 for CPE.  UDS/Contract 10/17/2020.

## 2020-12-04 ENCOUNTER — Telehealth: Payer: Medicare Other

## 2020-12-04 ENCOUNTER — Telehealth: Payer: Self-pay

## 2020-12-04 NOTE — Progress Notes (Deleted)
Chronic Care Management Pharmacy Note  12/04/2020 Name:  Cynthia Singh MRN:  [id number] DOB:  [birth year]  Summary: ***  Recommendations/Changes made from today's visit: ***  Plan: ***   Subjective: Cynthia Singh is an 80 y.o. year old female who is a primary patient of Venia Carbon, MD.  The CCM team was consulted for assistance with disease management and care coordination needs.    Engaged with patient by telephone for follow up visit in response to provider referral for pharmacy case management and/or care coordination services.   Consent to Services:  The patient was given information about Chronic Care Management services, agreed to services, and gave verbal consent prior to initiation of services.  Please see initial visit note for detailed documentation.   Patient Care Team: Venia Carbon, MD as PCP - General (Internal Medicine) End, Harrell Gave, MD as PCP - Cardiology (Cardiology) Debbora Dus, [facility] as Pharmacist (Pharmacist)  Recent office visits: 10/17/20 - Dr. Silvio Pate, PCP - Start duloxetine for depressed mood. A1c 8.1% stable and acceptable for her age.   Recent consult visits: None in previous 6 months  Hospital visits: None in previous 6 months   Objective:  Lab Results  Component Value Date   CREATININE 0.86 10/17/2020   BUN 14 10/17/2020   GFR 65.15 10/17/2020   GFRNONAA >60 09/20/2019   GFRAA >60 09/20/2019   NA 136 10/17/2020   K 4.3 10/17/2020   CALCIUM 9.3 10/17/2020   CO2 25 10/17/2020   GLUCOSE 217 (H) 10/17/2020    Lab Results  Component Value Date/Time   HGBA1C 8.1 (A) 10/17/2020 03:11 PM   HGBA1C 8.2 (A) 03/21/2020 03:38 PM   HGBA1C 13.8 (H) 05/24/2019 12:01 PM   HGBA1C 11.6 (H) 10/19/2017 10:51 AM   GFR 65.15 10/17/2020 03:56 PM   GFR 67.77 05/24/2019 12:01 PM    Last diabetic Eye exam:  Lab Results  Component Value Date/Time   HMDIABEYEEXA Retinopathy (A) 05/23/2018 12:00 AM    Last diabetic Foot exam:   Lab Results  Component Value Date/Time   HMDIABFOOTEX done 05/24/2019 12:00 AM     Lab Results  Component Value Date   CHOL 199 10/17/2020   HDL 36.30 (L) 10/17/2020   LDLCALC 62 09/05/2019   LDLDIRECT 88.0 10/17/2020   TRIG (H) 10/17/2020    834.0 Triglyceride is over 400; calculations on Lipids are invalid.   CHOLHDL 5 10/17/2020    Hepatic Function Latest Ref Rng & Units 10/17/2020 09/04/2019 05/24/2019  Total Protein 6.0 - 8.3 g/dL 6.9 6.4(L) 7.6  Albumin 3.5 - 5.2 g/dL 4.3 3.4(L) 4.4  AST 0 - 37 U/L _0 ALT 0 - 35 U/L _1 Alk Phosphatase 39 - 117 U/L 79 74 86  Total Bilirubin 0.2 - 1.2 mg/dL 0.2 0.8 0.5  Bilirubin, Direct 0.0 - 0.2 mg/dL - 0.2 -    Lab Results  Component Value Date/Time   TSH 1.572 10/19/2012 07:44 PM   TSH 1.75 10/05/2011 01:48 PM   FREET4 0.87 10/17/2020 03:56 PM   FREET4 0.95 04/06/2018 09:53 AM    CBC Latest Ref Rng & Units 10/17/2020 09/20/2019 09/06/2019  WBC 4.0 - 10.5 K/uL 10.1 10.2 12.5(H)  Hemoglobin 12.0 - 15.0 g/dL 13.7 13.2 14.2  Hematocrit 36.0 - 46.0 % 39.5 39.3 41.1  Platelets 150.0 - 400.0 K/uL 277.0 226 224    No results found for: VD25OH  Clinical ASCVD: Yes  The ASCVD Risk score Mikey Bussing DC  Jr., et al., 2013) failed to calculate for the following reasons:   The patient has a prior MI or stroke diagnosis    Depression screen [facility] 2/9 10/29/2016 04/30/2014  Decreased Interest 0 0  Down, Depressed, Hopeless 1 0  PHQ - 2 Score 1 0  Some recent data might be hidden    Social History   Tobacco Use  Smoking Status Never  Smokeless Tobacco Never   BP Readings from Last 3 Encounters:  10/17/20 118/74  07/11/20 140/76  03/21/20 128/84   Pulse Readings from Last 3 Encounters:  10/17/20 78  07/11/20 91  03/21/20 69   Wt Readings from Last 3 Encounters:  10/17/20 195 lb (88.5 kg)  07/11/20 191 lb 12 oz (87 kg)  03/21/20 191 lb (86.6 kg)   BMI Readings from Last 3 Encounters:  10/17/20 29.65 kg/m  07/11/20 29.16  kg/m  03/21/20 29.04 kg/m    Assessment/Interventions: Review of patient past medical history, allergies, medications, health status, including review of consultants reports, laboratory and other test data, was performed as part of comprehensive evaluation and provision of chronic care management services.   SDOH:  (Social Determinants of Health) assessments and interventions performed: Yes  SDOH Screenings   Alcohol Screen: Not on file  Depression (PHQ2-9): Not on file  Financial Resource Strain: Not on file  Food Insecurity: Not on file  Housing: Not on file  Physical Activity: Not on file  Social Connections: Not on file  Stress: Not on file  Tobacco Use: Low Risk    Smoking Tobacco Use: Never   Smokeless Tobacco Use: Never  Transportation Needs: Not on file    [place]  No Known Allergies  Medications Reviewed Today     Reviewed by Venia Carbon, MD (Physician) on 10/17/20 at 1526  Med List Status: <None>   Medication Order Taking? Sig Documenting Provider Last Dose Status Informant  amLODipine (NORVASC) 2.5 MG tablet [id number] Yes TAKE 1 TABLET BY MOUTH EVERY DAY End, Christopher, MD Taking Active   aspirin EC 81 MG tablet [id number] Yes Take 81 mg by mouth daily. [provider] Taking Active Self  atorvastatin (LIPITOR) 80 MG tablet [id number] Yes TAKE 1/2 TABLETS (40 MG TOTAL) BY MOUTH DAILY. Venia Carbon, MD Taking Active   carvedilol (COREG) 25 MG tablet [id number] Yes Take 1 tablet (25 mg total) by mouth 2 (two) times daily with a meal. Rise Mu, PA-C Taking Active   clopidogrel (PLAVIX) 75 MG tablet [id number] Yes TAKE 1 TABLET BY MOUTH EVERY DAY Venia Carbon, MD Taking Active   Cranberry 500 MG CAPS [id number] Yes Take 1,000 mg by mouth daily. [provider] Taking Active Self  empagliflozin (JARDIANCE) 25 MG TABS tablet [id number] Yes Take 25 mg by mouth daily before breakfast. Philemon Kingdom, MD Taking Active Self   Insulin Pen Needle (PEN NEEDLES) 32G X 5 MM MISC [id number] Yes 1 Units by Does not apply route daily. Venia Carbon, MD Taking Active Self  LANTUS SOLOSTAR 100 UNIT/ML Solostar Pen [id number] Yes TAKE 36 UNITS DAILY UNDER SKIN Venia Carbon, MD Taking Active   losartan-hydrochlorothiazide [facility]) 100-12.5 MG tablet [id number] Yes Take 1 tablet by mouth daily. Venia Carbon, MD Taking Active Self  metFORMIN (GLUCOPHAGE-XR) 500 MG 24 hr tablet [id number] Yes Take 2 tablets (1,000 mg total) by mouth in the morning and at bedtime. Venia Carbon, MD Taking Active   nitroGLYCERIN (NITROSTAT) 0.4 MG SL tablet [id number] Yes Place  1 tablet (0.4 mg total) under the tongue every 5 (five) minutes as needed for chest pain.  Patient taking differently: Place 0.4 mg under the tongue daily. For arm/back pain   Venia Carbon, MD Taking Active Self  nortriptyline (PAMELOR) 50 MG capsule [id number] Yes TAKE 3 CAPSULES (150 MG TOTAL) BY MOUTH AT BEDTIME. Venia Carbon, MD Taking Active   Omega-3 Fatty Acids (FISH OIL PO) [id number] Yes Take 1 capsule by mouth daily. [provider] Taking Active Self  oxyCODONE-acetaminophen (PERCOCET) 10-325 MG tablet [id number] Yes Take 1-2 tablets by mouth every 6 (six) hours as needed for pain. M54.9 Venia Carbon, MD Taking Active   polyethylene glycol [facility] / GLYCOLAX) packet [id number] Yes Take 17 g by mouth daily as needed.  [provider] Taking Active Self            Patient Active Problem List   Diagnosis Date Noted   Vaginal discomfort 06/14/2019   Peripheral vascular disease, unspecified ([place]) 2020   Narcotic dependence ([place]) 10/29/2016   CAD S/P percutaneous coronary angioplasty/DES (RCA-2014/LAD-2016) 07/19/2016   Preventative health care 04/30/2014   Advanced directives, counseling/discussion 04/30/2014   Atherosclerotic heart disease of native coronary artery with angina pectoris ([place]) 10/21/2012   Chest pain  10/19/2012   KNEE PAIN, LEFT, CHRONIC 02/25/2010   Poorly controlled type 2 diabetes mellitus with circulatory disorder ([place]) 12/30/2009   Hyperlipidemia LDL goal <70 12/30/2009   Episodic mood disorder ([place]) 12/30/2009   Essential hypertension 12/30/2009   Chronic back pain 12/30/2009    Immunization History  Administered Date(s) Administered   Fluad Quad(high Dose 65+) 03/21/2020   Influenza Split 02/15/2012   Influenza Whole 03/11/2009   Influenza, Seasonal, Injecte, Preservative Fre 03/25/2016   Influenza,inj,Quad PF,6+ Mos 03/20/2014, 02/09/2015, 02/23/2017, 01/21/2018, 05/24/2019   Moderna Sars-Covid-2 Vaccination 11/08/2019, 12/13/2019, 08/05/2020   Pneumococcal Conjugate-13 05/30/2013   Pneumococcal Polysaccharide-23 05/18/2009   Td 07/16/2008   Zoster Recombinat (Shingrix) 09/15/2018    Conditions to be addressed/monitored:  Diabetes and Depression  There are no care plans that you recently modified to display for this patient.   Current Barriers:  {pharmacybarriers:24917}  Pharmacist Clinical Goal(s):  Patient will {PHARMACYGOALCHOICES:24921} through collaboration with PharmD and provider.   Interventions: 1:1 collaboration with Venia Carbon, MD regarding development and update of comprehensive plan of care as evidenced by provider attestation and co-signature Inter-disciplinary care team collaboration (see longitudinal plan of care) Comprehensive medication review performed; medication list updated in electronic medical record  Diabetes (A1c goal {A1c goals:23924}) -{US controlled/uncontrolled:25276} -Current medications: Metformin 500 mg - 2 tablets twice daily with breakfast and supper Lantus - Inject 36 units every evening Jardiance 25 mg - 1 tablet daily -Medications previously tried: ***  -Current home glucose readings fasting glucose: *** post prandial glucose: *** -{ACTIONS;DENIES/REPORTS:21021675} hypoglycemic/hyperglycemic symptoms -Current  meal patterns:  breakfast: ***  lunch: ***  dinner: *** snacks: *** drinks: *** -Current exercise: *** -Educated on {CCM DM COUNSELING:25123} -Counseled to check feet daily and get yearly eye exams -{CCMPHARMDINTERVENTION:25122}  Depression/Anxiety (Goal: ***) -{US controlled/uncontrolled:25276} -Current treatment: Duloxetine 30 mg - 1 capsule daily -Medications previously tried/failed: *** -PHQ9: *** -GAD7: *** -Connected with *** for mental health support -Educated on {CCM mental health counseling:25127} -{CCMPHARMDINTERVENTION:25122}  Patient Goals/Self-Care Activities Patient will:  - {pharmacypatientgoals:24919}  Follow Up Plan: {CM FOLLOW UP WGYK:59935}  Medication Assistance: {MEDASSISTANCEINFO:25044}  Compliance/Adherence/Medication fill history: Care Gaps: ***  Star Rating Drugs: Medication:  Last Fill:         Day Supply Atorvastatin 43m                05/19/20              90 Lantus                                    11/10/20            42 Metformin XR 5076m           10/28/20            90  Patient's [facility] pharmacy is:  [id number] [place] [id number]: [phone]: [phone] pill box? {Yes or If no, why not?:20788} Pt endorses ***% compliance  We discussed: {Pharmacy options:24294} Patient decided to: [facility] and Follow Up Patient Decision:  Patient agrees to Care Plan and Follow-up.  MicDebbora DusharmD Clinical Pharmacist [phone]

## 2020-12-04 NOTE — Telephone Encounter (Signed)
  Chronic Care Management   Outreach Note  12/04/2020 Name: Cynthia Singh MRN: [id number] DOB: 1941/01/29  Referred by: Venia Carbon, MD Reason for referral: CCM  Multiple unsuccessful telephone outreaches attempted today. The patient was referred to pharmacist for assistance with care management and care coordination. Chart reviewed prior to telephone call for CCM follow up appointment.   Cynthia Singh, PharmD Clinical Pharmacist [facility] at [facility] [phone]

## 2020-12-11 ENCOUNTER — Other Ambulatory Visit: Payer: Self-pay | Admitting: Internal Medicine

## 2020-12-30 ENCOUNTER — Other Ambulatory Visit: Payer: Self-pay | Admitting: Internal Medicine

## 2020-12-30 ENCOUNTER — Other Ambulatory Visit: Payer: Self-pay | Admitting: *Deleted

## 2020-12-30 MED ORDER — OXYCODONE-ACETAMINOPHEN 10-325 MG PO TABS: 1.0000 | ORAL_TABLET | Freq: Four times a day (QID) | ORAL | 0 refills | Status: DC | PRN

## 2020-12-30 NOTE — Telephone Encounter (Signed)
Please contact pt for future appointment. Pt overdue for 6 wk f/u.  Pt hasn't been seen since 09/2019. Pt needing refills.

## 2020-12-30 NOTE — Telephone Encounter (Signed)
Patient left a voicemail requesting a refill  Name of Medication: Oxycodone [facility] Last Fill or Written Date and Quantity: 11/29/20 #150 Last Office Visit and Type: 10/17/20 Next Office Visit and Type: 01/15/21 Last Controlled Substance Agreement Date: 10/17/20 Last UDS:10/17/20

## 2020-12-31 NOTE — Telephone Encounter (Signed)
Appt scheduled

## 2021-01-14 ENCOUNTER — Other Ambulatory Visit: Payer: Self-pay | Admitting: Internal Medicine

## 2021-01-15 ENCOUNTER — Ambulatory Visit (INDEPENDENT_AMBULATORY_CARE_PROVIDER_SITE_OTHER): Payer: Medicare Other | Admitting: Internal Medicine

## 2021-01-15 ENCOUNTER — Encounter: Payer: Self-pay | Admitting: Internal Medicine

## 2021-01-15 ENCOUNTER — Other Ambulatory Visit: Payer: Self-pay

## 2021-01-15 VITALS — BP 136/84 | HR 94 | Temp 97.4°F | Ht 68.0 in | Wt 197.0 lb

## 2021-01-15 DIAGNOSIS — Z0000 Encounter for general adult medical examination without abnormal findings: Secondary | ICD-10-CM | POA: Diagnosis not present

## 2021-01-15 DIAGNOSIS — G8929 Other chronic pain: Secondary | ICD-10-CM | POA: Diagnosis not present

## 2021-01-15 DIAGNOSIS — I739 Peripheral vascular disease, unspecified: Secondary | ICD-10-CM | POA: Diagnosis not present

## 2021-01-15 DIAGNOSIS — E1159 Type 2 diabetes mellitus with other circulatory complications: Secondary | ICD-10-CM | POA: Diagnosis not present

## 2021-01-15 DIAGNOSIS — M5440 Lumbago with sciatica, unspecified side: Secondary | ICD-10-CM

## 2021-01-15 DIAGNOSIS — I25119 Atherosclerotic heart disease of native coronary artery with unspecified angina pectoris: Secondary | ICD-10-CM

## 2021-01-15 DIAGNOSIS — F1120 Opioid dependence, uncomplicated: Secondary | ICD-10-CM

## 2021-01-15 DIAGNOSIS — E1165 Type 2 diabetes mellitus with hyperglycemia: Secondary | ICD-10-CM

## 2021-01-15 DIAGNOSIS — Z23 Encounter for immunization: Secondary | ICD-10-CM

## 2021-01-15 DIAGNOSIS — F39 Unspecified mood [affective] disorder: Secondary | ICD-10-CM

## 2021-01-15 LAB — HM DIABETES EYE EXAM

## 2021-01-15 NOTE — Assessment & Plan Note (Signed)
I have personally reviewed the Medicare Annual Wellness questionnaire and have noted 1. The patient's medical and social history 2. Their use of alcohol, tobacco or illicit drugs 3. Their current medications and supplements 4. The patient's functional ability including ADL's, fall risks, home safety risks and hearing or visual             impairment. 5. Diet and physical activities 6. Evidence for depression or mood disorders  The patients weight, height, BMI and visual acuity have been recorded in the chart I have made referrals, counseling and provided education to the patient based review of the above and I have provided the pt with a written personalized care plan for preventive services.  I have provided you with a copy of your personalized plan for preventive services. Please take the time to review along with your updated medication list.  No cancer screening per her choice Flu vaccine today Bivalent COVID when available Discussed some exercise Needs 2nd shingrix

## 2021-01-15 NOTE — Assessment & Plan Note (Signed)
Lab Results  Component Value Date   HGBA1C 8.1 (A) 10/17/2020   Will recheck next time Jardiance, insulin, metformin

## 2021-01-15 NOTE — Assessment & Plan Note (Signed)
Slow functional decline Uses the oxycodone

## 2021-01-15 NOTE — Assessment & Plan Note (Signed)
Abnormal screen and absent pulse on left--but no claudication Will hold off on more testing Is on statin

## 2021-01-15 NOTE — Progress Notes (Signed)
Hearing Screening   '500Hz'$  '1000Hz'$  '2000Hz'$  '4000Hz'$   Right ear '25 20 20 25  '$ Left ear '25 20 20 25   '$ Vision Screening   Right eye Left eye Both eyes  Without correction 20/100 20/25 20/25  With correction N/A N/A N/A

## 2021-01-15 NOTE — Assessment & Plan Note (Signed)
DOE can be anginal equivalent--but is stable On statin, plavix, carvedilol, ASA losartan,jardiance

## 2021-01-15 NOTE — Assessment & Plan Note (Signed)
She doesn't notice improvement but seems less focused on depression Will continue low dose duloxetine and consider increase Amitriptyline for neuropathy

## 2021-01-15 NOTE — Progress Notes (Signed)
Subjective:    Patient ID: Cynthia Singh, female    DOB: [birth year], 80 y.o.   MRN: [id number]  HPI Here for Medicare wellness visit and follow up of chronic health conditions This visit occurred during the SARS-CoV-2 public health emergency.  Safety protocols were in place, including screening questions prior to the visit, additional usage of staff PPE, and extensive cleaning of exam room while observing appropriate contact time as indicated for disinfecting solutions.   Reviewed advanced directives Reviewed other doctors--- Dr End--cardiologist, Dr Era Bumpers No hospitalizations or surgery in the past year No alcohol or tobacco products No sig exercise---tries to walk some. Discussed 2 minor falls---no injuries Chronic depression Vision is okay Hearing okay---does have some buzzing in her right ear Does her shopping--hard to do housework--energy levels low. No help No apparent memory problems  Not sure if her mood is better with the duloxetine No clear improvement in back pain with this Still takes the oxycodone-- 2/2/1 Some degree of anhedonia--does enjoy TV. Only goes out to grocery shop Has lost all her friends  No chest pain No palpitations Some dizziness --no syncope No edema Still gets DOE--seems to be stable  Ongoing pain in legs--may occur with brief walking Some pain--knees, etc  Checks sugars 1-2 times per day Not checking fasting--200 random  Current Outpatient Medications on File Prior to Visit  Medication Sig Dispense Refill   amLODipine (NORVASC) 2.5 MG tablet TAKE 1 TABLET BY MOUTH EVERY DAY 30 tablet 1   aspirin EC 81 MG tablet Take 81 mg by mouth daily.     atorvastatin (LIPITOR) 80 MG tablet TAKE 1/2 TABLETS (40 MG TOTAL) BY MOUTH DAILY. 45 tablet 1   carvedilol (COREG) 25 MG tablet Take 1 tablet (25 mg total) by mouth 2 (two) times daily with a meal. 60 tablet 6   clopidogrel (PLAVIX) 75 MG tablet TAKE 1 TABLET BY MOUTH EVERY DAY 90  tablet 3   Cranberry 500 MG CAPS Take 1,000 mg by mouth daily.     DULoxetine (CYMBALTA) 30 MG capsule TAKE 1 CAPSULE BY MOUTH EVERY DAY 90 capsule 2   empagliflozin (JARDIANCE) 25 MG TABS tablet Take 25 mg by mouth daily before breakfast. 30 tablet 5   Insulin Pen Needle (PEN NEEDLES) 32G X 5 MM MISC 1 Units by Does not apply route daily. 100 each 3   LANTUS SOLOSTAR 100 UNIT/ML Solostar Pen TAKE 36 UNITS DAILY UNDER SKIN 15 mL 11   losartan-hydrochlorothiazide (HYZAAR) 100-12.5 MG tablet Take 1 tablet by mouth daily. 90 tablet 3   metFORMIN (GLUCOPHAGE-XR) 500 MG 24 hr tablet TAKE 2 TABLETS (1,000 MG TOTAL) BY MOUTH IN THE MORNING AND AT BEDTIME. 360 tablet 3   nitroGLYCERIN (NITROSTAT) 0.4 MG SL tablet Place 1 tablet (0.4 mg total) under the tongue every 5 (five) minutes as needed for chest pain. (Patient taking differently: Place 0.4 mg under the tongue daily. For arm/back pain) 30 tablet 0   nortriptyline (PAMELOR) 50 MG capsule TAKE 3 CAPSULES (150 MG TOTAL) BY MOUTH AT BEDTIME. 270 capsule 3   Omega-3 Fatty Acids (FISH OIL PO) Take 1 capsule by mouth daily.     oxyCODONE-acetaminophen (PERCOCET) 10-325 MG tablet Take 1-2 tablets by mouth every 6 (six) hours as needed for pain. M54.9 150 tablet 0   polyethylene glycol (MIRALAX / GLYCOLAX) packet Take 17 g by mouth daily as needed.      No current facility-administered medications on file prior to visit.  No Known Allergies  Past Medical History:  Diagnosis Date   CAD (coronary artery disease)    a. Remote nonobstructive disease but in 10/2012 Cath/PCI: s/p DES to RCA. b. cath 02/08/15 DES to prox LAD and balloon angioplasty of ost D1, EF 55-65%; c. 08/2019 PCI/DES prox/distal RCA (overlapping prev RCA stent). Prev placed RCA/LAD stents patent.   Concussion    SAH AFTER FALL 3/18   Depression    Diabetes mellitus, type 2 ([place])    Dyslipidemia    Episodic mood disorder ([place])    Hypertension    Myocardial infarction ([place])    2014    Obesity    Osteoarthritis of spine    knees also   Peripheral vascular disease, unspecified ([place]) 2020   severe disease on home screening by insurance    Past Surgical History:  Procedure Laterality Date   ABDOMINAL HYSTERECTOMY     [place] N/A 02/08/2015   Procedure: Left Heart Cath and Coronary Angiography;  Surgeon: Sherren Mocha, MD;  Location: [place] [facility];  Service: Cardiovascular;  Laterality: N/A;   CARDIAC CATHETERIZATION N/A 02/08/2015   Procedure: Coronary Stent Intervention;  Surgeon: Sherren Mocha, MD;  Location: [place] [facility];  Service: Cardiovascular;  Laterality: N/A;   CARDIAC CATHETERIZATION N/A 02/08/2015   Procedure: Intravascular Pressure Wire/FFR Study;  Surgeon: Sherren Mocha, MD;  Location: [place] [facility];  Service: Cardiovascular;  Laterality: N/A;   CATARACT EXTRACTION W/PHACO Left 11/10/2016   Procedure: CATARACT EXTRACTION PHACO AND INTRAOCULAR LENS PLACEMENT ([place]) suture placed in left eye at end of procedure;  Surgeon: Birder Robson, MD;  Location: [facility];  Service: Ophthalmology;  Laterality: Left;  [place]  3.20 AP% 27.7 CDE 55.63 Fluid pack lot # [id number]   CHOLECYSTECTOMY     COMBINED HYSTERECTOMY ABDOMINAL W/ A&P REPAIR / OOPHORECTOMY  1968   CORONARY ANGIOPLASTY     STENTS X 5   CORONARY STENT INTERVENTION N/A 09/19/2019   Procedure: CORONARY STENT INTERVENTION;  Surgeon: Nelva Bush, MD;  Location: [place] [facility];  Service: Cardiovascular;  Laterality: N/A;   CORONARY STENT PLACEMENT  2014   DILATION AND CURETTAGE OF UTERUS     GALLBLADDER SURGERY  1968   LEFT HEART CATH AND CORONARY ANGIOGRAPHY Left 09/19/2019   Procedure: LEFT HEART CATH AND CORONARY ANGIOGRAPHY;  Surgeon: Nelva Bush, MD;  Location: [place] [facility];  Service: Cardiovascular;  Laterality: Left;   LEFT HEART CATHETERIZATION WITH CORONARY ANGIOGRAM N/A 10/20/2012   Procedure: LEFT HEART CATHETERIZATION WITH  CORONARY ANGIOGRAM;  Surgeon: Sherren Mocha, MD;  Location: [facility];  Service: Cardiovascular;  Laterality: N/A;   multiple D&C     TONSILLECTOMY  age 9    Family History  Adopted: Yes  Problem Relation Age of Onset   Cancer Brother        Colon   Cancer Son    Coronary artery disease Neg Hx    Heart attack Neg Hx     Social History   Socioeconomic History   Marital status: Widowed    Spouse name: Not on file   Number of children: 3   Years of education: 75   Highest education level: Not on file  Occupational History   Occupation: CASHIER  Tobacco Use   Smoking status: Never   Smokeless tobacco: Never  Vaping Use   Vaping Use: Never used  Substance and Sexual Activity   Alcohol use: No   Drug use:  No   Sexual activity: Never    Birth control/protection: Post-menopausal  Other Topics Concern   Not on file  Social History Narrative   Widowed '13. 2 sons- '63, '66; one daughter-'69;    61 grandchildren--has raised 9 of her grandchildren 4 great grandchildren.   Lives with 2 granddaughters and adoptive son      Has living will   Probably would have granddaughter Desiree   Would accept resuscitation but no prolonged ventilation   Not sure about tube feeds   Social Determinants of Health   Financial Resource Strain: Not on file  Food Insecurity: Not on file  Transportation Needs: Not on file  Physical Activity: Not on file  Stress: Not on file  Social Connections: Not on file  Intimate Partner Violence: Not on file   Review of Systems Trying to eat less--but hasn't lost weight Sleeps intermittently--wakes every 2-3 hours. Nocturia the issue? No dysuria or hematuria. Some mixed leakage Wears seat belt Upper plate, lost lower plate Heartburn is better lately--rare dysphagia with pills. Rare alka seltzer Bowels are okay--no blood No other joint pains    Objective:   Physical Exam Constitutional:      Appearance: Normal appearance.  HENT:      Mouth/Throat:     Comments: No lesions Eyes:     Conjunctiva/sclera: Conjunctivae normal.     Pupils: Pupils are equal, round, and reactive to light.  Cardiovascular:     Rate and Rhythm: Normal rate and regular rhythm.     Heart sounds: No murmur heard.   No gallop.     Comments: 1+ pulse on right, absent left foot Abdominal:     Palpations: Abdomen is soft.     Tenderness: There is no abdominal tenderness.  Musculoskeletal:     Cervical back: Neck supple.     Right lower leg: No edema.     Left lower leg: No edema.  Lymphadenopathy:     Cervical: No cervical adenopathy.  Skin:    General: Skin is warm.     Findings: No rash.     Comments: No foot lesions  Neurological:     Mental Status: She is alert and oriented to person, place, and time.     Comments: President--- "Zoila Shutter, Obama" [phone] D-l-o-w Recall 3/3  Decreased fine touch sensation in feet  Psychiatric:        Mood and Affect: Mood normal.        Behavior: Behavior normal.           Assessment & Plan:

## 2021-01-15 NOTE — Addendum Note (Signed)
Addended by: Dalia Heading R on: 01/15/2021 10:24 AM   Modules accepted: Orders

## 2021-01-15 NOTE — Assessment & Plan Note (Signed)
PDMP reviewed No concerns 

## 2021-01-21 NOTE — Progress Notes (Deleted)
Cardiology Office Note:    Date:  01/21/2021   ID:  Cynthia Singh, DOB 1940-05-22, MRN [id number]  PCP:  Venia Carbon, MD  [facility] Cardiologist:  Nelva Bush, MD  [facility] Electrophysiologist:  None   Referring MD: Venia Carbon, MD   Chief Complaint: follow-up***  History of Present Illness:    Cynthia Singh is a 80 y.o. female with a hx of CAD s/p RCA stenting in 2014 and PCI/DES to the LAD with POBA to the D1 in 9/016, poorly controlled DM, bilateral SDH in the setting of mechanical fall in 2018 while on DAPT, HTN, HLD, dilated ascending aorta, and OA who presents for follow-up.   She was admitted in 01/2015 with unstable angina. Diagnostic LHC at that time showed 8% pLAD stenosis treated with PCI/DES, 95% ostial D1 lesion treated with PTCA with 40% residual stenosis, 60% mid Lcx lesion, patent RCA stent, EF 55-65%. She was seen in the hospital follow up but was lost to follow-up thereafter. She was seen again by cardiology 07/2016 in the setting of a mechanical fall, suffering bilateral SDH while on DAPT. Plavix was discontinued with recommendation to resume ASA when safe. Seen again on admission 08/2017 for atypical chest pain.  Initial BP was in the [id number] systolic.  Chest x-ray showed vascular congestion.  EKG showed sinus rhythm with LVH, poor R wave progression, and nonspecific ST-T changes.  CTA of the aortic showed no type A dissection with likely atherosclerotic disease with an ascending aorta measuring 3.7 cm.  High-sensitivity troponin peaked at 150.  She was placed on a heparin drip.  Initially, plans were for diagnostic LHC, however she declined this.  Echo showed an EF of 60 to 65%, normal RV systolic function and ventricular cavity size, trivial mitral regurgitation.  She underwent Lexiscan MPI on 09/06/2019 which showed no significant ischemia and normal wall motion with an EF falsely reduced at 39% secondary to GI uptake artifact with EF noted to be  normal by echo during the same admission.  Overall, this was a low risk scan.  She was hospitalized 09/2019 for NSTEMI. The patient declined cath and was medically managed. Subsequent MPI test was low risk. However, given persistent symptoms she was referred for coronary angiography and found to have moderate proximal and severe distal RCA disease as well as moderate to severe mid Lcx stenosis. She underwent stenting of the proximal and distal RCA with resolution of her symptoms.   Last seen 09/29/19 and was feeling better. She still had anginal equivalent back and left shoulder pain that was decided to escalate medical management for.   Today,     Past Medical History:  Diagnosis Date   CAD (coronary artery disease)    a. Remote nonobstructive disease but in 10/2012 Cath/PCI: s/p DES to RCA. b. cath 02/08/15 DES to prox LAD and balloon angioplasty of ost D1, EF 55-65%; c. 08/2019 PCI/DES prox/distal RCA (overlapping prev RCA stent). Prev placed RCA/LAD stents patent.   Concussion    SAH AFTER FALL 3/18   Depression    Diabetes mellitus, type 2 ([place])    Dyslipidemia    Episodic mood disorder (HCC)    Hypertension    Myocardial infarction ([place])    2014   Obesity    Osteoarthritis of spine    knees also   Peripheral vascular disease, unspecified ([place]) 2020   severe disease on home screening by insurance    Past Surgical History:  Procedure Laterality Date  ABDOMINAL HYSTERECTOMY     APPENDECTOMY  1959   CARDIAC CATHETERIZATION N/A 02/08/2015   Procedure: Left Heart Cath and Coronary Angiography;  Surgeon: Sherren Mocha, MD;  Location: [place] [facility];  Service: Cardiovascular;  Laterality: N/A;   CARDIAC CATHETERIZATION N/A 02/08/2015   Procedure: Coronary Stent Intervention;  Surgeon: Sherren Mocha, MD;  Location: [place] [facility];  Service: Cardiovascular;  Laterality: N/A;   CARDIAC CATHETERIZATION N/A 02/08/2015   Procedure: Intravascular Pressure Wire/FFR Study;  Surgeon:  Sherren Mocha, MD;  Location: [place] [facility];  Service: Cardiovascular;  Laterality: N/A;   CATARACT EXTRACTION W/PHACO Left 11/10/2016   Procedure: CATARACT EXTRACTION PHACO AND INTRAOCULAR LENS PLACEMENT ([place]) suture placed in left eye at end of procedure;  Surgeon: Birder Robson, MD;  Location: [facility];  Service: Ophthalmology;  Laterality: Left;  [place]  3.20 AP% 27.7 CDE 55.63 Fluid pack lot # [id number]   CHOLECYSTECTOMY     COMBINED HYSTERECTOMY ABDOMINAL W/ A&P REPAIR / OOPHORECTOMY  1968   CORONARY ANGIOPLASTY     STENTS X 5   CORONARY STENT INTERVENTION N/A 09/19/2019   Procedure: CORONARY STENT INTERVENTION;  Surgeon: Nelva Bush, MD;  Location: [place] [facility];  Service: Cardiovascular;  Laterality: N/A;   CORONARY STENT PLACEMENT  2014   DILATION AND CURETTAGE OF UTERUS     GALLBLADDER SURGERY  1968   LEFT HEART CATH AND CORONARY ANGIOGRAPHY Left 09/19/2019   Procedure: LEFT HEART CATH AND CORONARY ANGIOGRAPHY;  Surgeon: Nelva Bush, MD;  Location: [place] [facility];  Service: Cardiovascular;  Laterality: Left;   LEFT HEART CATHETERIZATION WITH CORONARY ANGIOGRAM N/A 10/20/2012   Procedure: LEFT HEART CATHETERIZATION WITH CORONARY ANGIOGRAM;  Surgeon: Sherren Mocha, MD;  Location: [facility];  Service: Cardiovascular;  Laterality: N/A;   multiple D&C     TONSILLECTOMY  age 45    Current Medications: No outpatient medications have been marked as taking for the 01/22/21 encounter (Appointment) with Kathlen Mody, Karanvir Balderston H, PA-C.     Allergies:   Patient has no known allergies.   Social History   Socioeconomic History   Marital status: Widowed    Spouse name: Not on file   Number of children: 3   Years of education: 51   Highest education level: Not on file  Occupational History   Occupation: CASHIER  Tobacco Use   Smoking status: Never   Smokeless tobacco: Never  Vaping Use   Vaping Use: Never used  Substance and Sexual Activity   Alcohol use: No   Drug  use: No   Sexual activity: Never    Birth control/protection: Post-menopausal  Other Topics Concern   Not on file  Social History Narrative   Widowed '13. 2 sons- '63, '66; one daughter-'69;    16 grandchildren--has raised 9 of her grandchildren 4 great grandchildren.   Lives with 2 granddaughters and adoptive son      Has living will   Probably would have granddaughter Desiree   Would accept resuscitation but no prolonged ventilation   Not sure about tube feeds   Social Determinants of Health   Financial Resource Strain: Not on file  Food Insecurity: Not on file  Transportation Needs: Not on file  Physical Activity: Not on file  Stress: Not on file  Social Connections: Not on file     Family History: The patient's ***family history includes Cancer in her brother and son. There is no history of Coronary artery disease or Heart attack. She was  adopted.  ROS:   Please see the history of present illness.    *** All other systems reviewed and are negative.  EKGs/Labs/Other Studies Reviewed:    The following studies were reviewed today: ***  EKG:  EKG is *** ordered today.  The ekg ordered today demonstrates ***  Recent Labs: 10/17/2020: ALT 17; BUN 14; Creatinine, Ser 0.86; Hemoglobin 13.7; Platelets 277.0; Potassium 4.3; Sodium 136  Recent Lipid Panel    Component Value Date/Time   CHOL 199 10/17/2020 1556   TRIG (H) 10/17/2020 1556    834.0 Triglyceride is over 400; calculations on Lipids are invalid.   HDL 36.30 (L) 10/17/2020 1556   CHOLHDL 5 10/17/2020 1556   VLDL 61 (H) 09/05/2019 0549   LDLCALC 62 09/05/2019 0549   LDLDIRECT 88.0 10/17/2020 1556     Risk Assessment/Calculations:   {Does this patient have ATRIAL FIBRILLATION?[phone]}   Physical Exam:    VS:  There were no vitals taken for this visit.    Wt Readings from Last 3 Encounters:  01/15/21 197 lb (89.4 kg)  10/17/20 195 lb (88.5 kg)  07/11/20 191 lb 12 oz (87 kg)     GEN: *** Well  nourished, well developed in no acute distress HEENT: Normal NECK: No JVD; No carotid bruits LYMPHATICS: No lymphadenopathy CARDIAC: ***RRR, no murmurs, rubs, gallops RESPIRATORY:  Clear to auscultation without rales, wheezing or rhonchi  ABDOMEN: Soft, non-tender, non-distended MUSCULOSKELETAL:  No edema; No deformity  SKIN: Warm and dry NEUROLOGIC:  Alert and oriented x 3 PSYCHIATRIC:  Normal affect   ASSESSMENT:    No diagnosis found. PLAN:    In order of problems listed above:  CAD Stable angina  HLD  HTN  Disposition: Follow up {follow up:15908} with ***   Shared Decision Making/Informed Consent   {Are you ordering a CV Procedure (e.g. stress test, cath, DCCV, TEE, etc)?   Press F2        :[id number]    Signed, Monae Topping Ninfa Meeker, PA-C  01/21/2021 4:08 PM    [facility]

## 2021-01-22 ENCOUNTER — Ambulatory Visit: Payer: Medicare Other | Admitting: Medical

## 2021-01-28 ENCOUNTER — Telehealth: Payer: Self-pay

## 2021-01-28 NOTE — Progress Notes (Addendum)
Chronic Care Management Pharmacy Assistant   Name: Cynthia Singh  MRN: [id number] DOB: [birth year]  Reason for Encounter: Diabetes Disease State   Recent office visits:  01/15/2021 - [name], MD - Patient presented for Annual Wellness Exam. No medication changes. Follow up in 3 months.  10/17/2020 - [name], MD - Patient presented for Narcotic (back pain) follow-up and DM. Labs: CMP, Drug monitoring panel, A1c, Lipid panel, CBC, DM Template, LDL and T4. Start: DULoxetine (CYMBALTA) 30 MG capsule. Follow up in 2 months.   Recent consult visits:  None since last CCM contact  Hospital visits:  None in previous 6 months  Medications: Outpatient Encounter Medications as of 01/28/2021  Medication Sig   amLODipine (NORVASC) 2.5 MG tablet TAKE 1 TABLET BY MOUTH EVERY DAY   aspirin EC 81 MG tablet Take 81 mg by mouth daily.   atorvastatin (LIPITOR) 80 MG tablet TAKE 1/2 TABLETS (40 MG TOTAL) BY MOUTH DAILY.   carvedilol (COREG) 25 MG tablet Take 1 tablet (25 mg total) by mouth 2 (two) times daily with a meal.   clopidogrel (PLAVIX) 75 MG tablet TAKE 1 TABLET BY MOUTH EVERY DAY   Cranberry 500 MG CAPS Take 1,000 mg by mouth daily.   DULoxetine (CYMBALTA) 30 MG capsule TAKE 1 CAPSULE BY MOUTH EVERY DAY   empagliflozin (JARDIANCE) 25 MG TABS tablet Take 25 mg by mouth daily before breakfast.   Insulin Pen Needle (PEN NEEDLES) 32G X 5 MM MISC 1 Units by Does not apply route daily.   LANTUS SOLOSTAR 100 UNIT/ML Solostar Pen TAKE 36 UNITS DAILY UNDER SKIN   losartan-hydrochlorothiazide (HYZAAR) 100-12.5 MG tablet Take 1 tablet by mouth daily.   metFORMIN (GLUCOPHAGE-XR) 500 MG 24 hr tablet TAKE 2 TABLETS (1,000 MG TOTAL) BY MOUTH IN THE MORNING AND AT BEDTIME.   nitroGLYCERIN (NITROSTAT) 0.4 MG SL tablet Place 1 tablet (0.4 mg total) under the tongue every 5 (five) minutes as needed for chest pain. (Patient taking differently: Place 0.4 mg under the tongue daily. For arm/back  pain)   nortriptyline (PAMELOR) 50 MG capsule TAKE 3 CAPSULES (150 MG TOTAL) BY MOUTH AT BEDTIME.   Omega-3 Fatty Acids (FISH OIL PO) Take 1 capsule by mouth daily.   oxyCODONE-acetaminophen (PERCOCET) 10-325 MG tablet Take 1-2 tablets by mouth every 6 (six) hours as needed for pain. M54.9   polyethylene glycol (MIRALAX / GLYCOLAX) packet Take 17 g by mouth daily as needed.    No facility-administered encounter medications on file as of 01/28/2021.    Recent Relevant Labs: Lab Results  Component Value Date/Time   HGBA1C 8.1 (A) 10/17/2020 03:11 PM   HGBA1C 8.2 (A) 03/21/2020 03:38 PM   HGBA1C 13.8 (H) 05/24/2019 12:01 PM   HGBA1C 11.6 (H) 10/19/2017 10:51 AM    Kidney Function Lab Results  Component Value Date/Time   CREATININE 0.86 10/17/2020 03:56 PM   CREATININE 0.86 09/20/2019 06:18 AM   GFR 65.15 10/17/2020 03:56 PM   GFRNONAA >60 09/20/2019 06:18 AM   GFRAA >60 09/20/2019 06:18 AM   Contacted patient on 02/05/2021 to discuss diabetes disease state.   Current antihyperglycemic regimen:  Metformin 500 mg - 2 tablets twice daily with breakfast and supper Jardiance 25 mg - 1 tablet daily  Lantus - Inject 36 units every evening    Patient verbally confirms she is taking the above medications as directed. Yes  What diet changes have been made to improve diabetes control? Patient watches what she eats and  tries to limit sugar.   What recent interventions/DTPs have been made to improve glycemic control:  None noted  Have there been any recent hospitalizations or ED visits since last visit with CPP? No  Patient denies hypoglycemic symptoms, including Pale, Sweaty, Shaky, Hungry, Nervous/irritable, and Vision changes  Patient denies hyperglycemic symptoms, including blurry vision, excessive thirst, fatigue, polyuria, and weakness  How often are you checking your blood sugar? Patient states she checks it every couple of days. Patient needs a new glucose meter. She states she  takes it when she gets up in the morning before she eats. It usually ranges from 150-160. Patient states that is better than the 200s it was before.   During the week, how often does your blood glucose drop below 70? Never  Are you checking your feet daily/regularly? Yes  Adherence Review: Is the patient currently on a STATIN medication? Yes - atorvastatin Is the patient currently on ACE/ARB medication? Yes - losartan Does the patient have >5 day gap between last estimated fill dates? Unknown  Care Gaps: Annual wellness visit in last year? Yes 01/15/2021 Most recent A1C reading: 8.1 on 10/17/2020 Most Recent BP reading: 136/84 on 01/15/2021  Last eye exam / retinopathy screening: 05/23/2018 Last diabetic foot exam: 05/24/2019  Counseled patient on importance of taking her blood glucose regularly and keeping a log.   Star Rating Drugs:  Medication:  Last Fill: Day Supply Metformin '500mg'$  01/04/2021       90 Atorvastatin  No fill history Losartan  No fill history  No appointments scheduled within the next 30 days.  Debbora Dus, CPP notified  [name], [place] Clinical Pharmacy Assistant [phone]  I have reviewed the care management and care coordination activities outlined in this encounter and I am certifying that I agree with the content of this note. Patient missed last CCM visit. Will schedule follow up - Nov 2022.  Debbora Dus, PharmD Clinical Pharmacist [facility] [phone]

## 2021-01-29 ENCOUNTER — Other Ambulatory Visit: Payer: Self-pay | Admitting: Internal Medicine

## 2021-01-29 MED ORDER — OXYCODONE-ACETAMINOPHEN 10-325 MG PO TABS: 1.0000 | ORAL_TABLET | Freq: Four times a day (QID) | ORAL | 0 refills | Status: DC | PRN

## 2021-01-29 NOTE — Telephone Encounter (Signed)
  Encourage patient to contact the pharmacy for refills or they can request refills through [place]:  Please schedule appointment if longer than 1 year  NEXT APPOINTMENT DATE:  MEDICATION:oxyCODONE-acetaminophen (PERCOCET) 10-325 MG table  Is the patient out of medication?   [id number] [place] [address]  Let patient know to contact pharmacy at the end of the day to make sure medication is ready.  Please notify patient to allow 48-72 hours to process  CLINICAL FILLS OUT ALL BELOW:   LAST REFILL:  QTY:  REFILL DATE:    OTHER COMMENTS:    Okay for refill?  Please advise

## 2021-01-29 NOTE — Telephone Encounter (Signed)
Patient left a voicemail requesting a refill  Name of Medication: Oxycodone [facility] Last Fill or Written Date and Quantity: 12/30/20 #150 Last Office Visit and Type: 01-15-21 Next Office Visit and Type: 04-16-21 Last Controlled Substance Agreement Date: 10/17/20 Last UDS:10/17/20

## 2021-02-26 ENCOUNTER — Other Ambulatory Visit: Payer: Self-pay | Admitting: Internal Medicine

## 2021-02-26 MED ORDER — OXYCODONE-ACETAMINOPHEN 10-325 MG PO TABS: 1.0000 | ORAL_TABLET | Freq: Four times a day (QID) | ORAL | 0 refills | Status: DC | PRN

## 2021-02-26 NOTE — Telephone Encounter (Signed)
Patient left a voicemail requesting a refill  Name of Medication: Oxycodone Name of [facility] Last Fill or Written Date and Quantity: 01/29/21 #150 Last Office Visit and Type: 01-15-21 Next Office Visit and Type: 04-16-21 Last Controlled Substance Agreement Date: 10/17/20 Last UDS:10/17/20

## 2021-02-26 NOTE — Telephone Encounter (Signed)
  Encourage patient to contact the pharmacy for refills or they can request refills through [place]:  Please schedule appointment if longer than 1 year  NEXT APPOINTMENT DATE:04/16/21  MEDICATION:oxycodone  Is the patient out of medication? yes  [facility] [address] [facility] [place]  Let patient know to contact pharmacy at the end of the day to make sure medication is ready.  Please notify patient to allow 48-72 hours to process  CLINICAL FILLS OUT ALL BELOW:   LAST REFILL:  QTY:  REFILL DATE:    OTHER COMMENTS:    Okay for refill?  Please advise

## 2021-03-07 ENCOUNTER — Encounter: Payer: Self-pay | Admitting: Internal Medicine

## 2021-03-18 NOTE — Progress Notes (Deleted)
Chronic Care Management Pharmacy Note  03/18/2021 Name:  Cynthia Singh MRN:  [id number] DOB:  08-06-1940  Summary: ***  Recommendations/Changes made from today's visit: ***  Plan: ***   Subjective: Cynthia Singh is an 80 y.o. year old female who is a primary patient of Venia Carbon, MD.  The CCM team was consulted for assistance with disease management and care coordination needs.    Engaged with patient by telephone for follow up visit in response to provider referral for pharmacy case management and/or care coordination services.   Consent to Services:  The patient was given information about Chronic Care Management services, agreed to services, and gave verbal consent prior to initiation of services.  Please see initial visit note for detailed documentation.   Patient Care Team: Venia Carbon, MD as PCP - General (Internal Medicine) End, Harrell Gave, MD as PCP - Cardiology (Cardiology) Debbora Dus, [employer] as Pharmacist (Pharmacist)  Recent office visits: 01/15/2021 - [name], MD - Patient presented for Annual Wellness Exam. Continue duloxetine for mood. No medication changes. Follow up in 3 months.  10/17/2020 - [name], MD - Patient presented for Narcotic (back pain) follow-up, depression and DM. Start: DULoxetine (CYMBALTA) 30 MG capsule for mood. Follow up in 2 months.    Recent consult visits:  None since last CCM contact   Hospital visits:  None in previous 6 months  Objective:  Lab Results  Component Value Date   CREATININE 0.86 10/17/2020   BUN 14 10/17/2020   GFR 65.15 10/17/2020   GFRNONAA >60 09/20/2019   GFRAA >60 09/20/2019   NA 136 10/17/2020   K 4.3 10/17/2020   CALCIUM 9.3 10/17/2020   CO2 25 10/17/2020   GLUCOSE 217 (H) 10/17/2020    Lab Results  Component Value Date/Time   HGBA1C 8.1 (A) 10/17/2020 03:11 PM   HGBA1C 8.2 (A) 03/21/2020 03:38 PM   HGBA1C 13.8 (H) 05/24/2019 12:01 PM   HGBA1C 11.6 (H)  10/19/2017 10:51 AM   GFR 65.15 10/17/2020 03:56 PM   GFR 67.77 05/24/2019 12:01 PM    Last diabetic Eye exam:  Lab Results  Component Value Date/Time   HMDIABEYEEXA No Retinopathy 01/15/2021 12:00 AM    Last diabetic Foot exam:  Lab Results  Component Value Date/Time   HMDIABFOOTEX done 05/24/2019 12:00 AM     Lab Results  Component Value Date   CHOL 199 10/17/2020   HDL 36.30 (L) 10/17/2020   LDLCALC 62 09/05/2019   LDLDIRECT 88.0 10/17/2020   TRIG (H) 10/17/2020    834.0 Triglyceride is over 400; calculations on Lipids are invalid.   CHOLHDL 5 10/17/2020    Hepatic Function Latest Ref Rng & Units 10/17/2020 09/04/2019 05/24/2019  Total Protein 6.0 - 8.3 g/dL 6.9 6.4(L) 7.6  Albumin 3.5 - 5.2 g/dL 4.3 3.4(L) 4.4  AST 0 - 37 U/L '18 20 11  ' ALT 0 - 35 U/L '17 14 13  ' Alk Phosphatase 39 - 117 U/L 79 74 86  Total Bilirubin 0.2 - 1.2 mg/dL 0.2 0.8 0.5  Bilirubin, Direct 0.0 - 0.2 mg/dL - 0.2 -    Lab Results  Component Value Date/Time   TSH 1.572 10/19/2012 07:44 PM   TSH 1.75 10/05/2011 01:48 PM   FREET4 0.87 10/17/2020 03:56 PM   FREET4 0.95 04/06/2018 09:53 AM    CBC Latest Ref Rng & Units 10/17/2020 09/20/2019 09/06/2019  WBC 4.0 - 10.5 K/uL 10.1 10.2 12.5(H)  Hemoglobin 12.0 - 15.0 g/dL 13.7 13.2  14.2  Hematocrit 36.0 - 46.0 % 39.5 39.3 41.1  Platelets 150.0 - 400.0 K/uL 277.0 226 224    No results found for: VD25OH  Clinical ASCVD: Yes  The ASCVD Risk score (Arnett DK, et al., 2019) failed to calculate for the following reasons:   The patient has a prior MI or stroke diagnosis    Depression screen [facility] 2/9 01/15/2021 10/29/2016 04/30/2014  Decreased Interest 0 0 0  Down, Depressed, Hopeless 0 1 0  PHQ - 2 Score 0 1 0  Some recent data might be hidden    Social History   Tobacco Use  Smoking Status Never  Smokeless Tobacco Never   BP Readings from Last 3 Encounters:  01/15/21 136/84  10/17/20 118/74  07/11/20 140/76   Pulse Readings from Last 3 Encounters:   01/15/21 94  10/17/20 78  07/11/20 91   Wt Readings from Last 3 Encounters:  01/15/21 197 lb (89.4 kg)  10/17/20 195 lb (88.5 kg)  07/11/20 191 lb 12 oz (87 kg)   BMI Readings from Last 3 Encounters:  01/15/21 29.95 kg/m  10/17/20 29.65 kg/m  07/11/20 29.16 kg/m    Assessment/Interventions: Review of patient past medical history, allergies, medications, health status, including review of consultants reports, laboratory and other test data, was performed as part of comprehensive evaluation and provision of chronic care management services.   SDOH:  (Social Determinants of Health) assessments and interventions performed: Yes  SDOH Screenings   Alcohol Screen: Not on file  Depression (PHQ2-9): Low Risk    PHQ-2 Score: 0  Financial Resource Strain: Not on file  Food Insecurity: Not on file  Housing: Not on file  Physical Activity: Not on file  Social Connections: Not on file  Stress: Not on file  Tobacco Use: Low Risk    Smoking Tobacco Use: Never   Smokeless Tobacco Use: Never   Passive Exposure: Not on file  Transportation Needs: Not on file    [place]  No Known Allergies  Medications Reviewed Today     Reviewed by Venia Carbon, MD (Physician) on 01/15/21 at [id number]  Med List Status: <None>   Medication Order Taking? Sig Documenting Provider Last Dose Status Informant  amLODipine (NORVASC) 2.5 MG tablet [id number] Yes TAKE 1 TABLET BY MOUTH EVERY DAY End, Christopher, MD Taking Active   aspirin EC 81 MG tablet [id number] Yes Take 81 mg by mouth daily. [provider] Taking Active Self  atorvastatin (LIPITOR) 80 MG tablet [id number] Yes TAKE 1/2 TABLETS (40 MG TOTAL) BY MOUTH DAILY. Venia Carbon, MD Taking Active   carvedilol (COREG) 25 MG tablet [id number] Yes Take 1 tablet (25 mg total) by mouth 2 (two) times daily with a meal. Rise Mu, PA-C Taking Active   clopidogrel (PLAVIX) 75 MG tablet [id number] Yes TAKE 1 TABLET BY MOUTH EVERY DAY  Venia Carbon, MD Taking Active   Cranberry 500 MG CAPS [id number] Yes Take 1,000 mg by mouth daily. [provider] Taking Active Self  DULoxetine (CYMBALTA) 30 MG capsule [id number] Yes TAKE 1 CAPSULE BY MOUTH EVERY DAY Venia Carbon, MD Taking Active   empagliflozin (JARDIANCE) 25 MG TABS tablet [id number] Yes Take 25 mg by mouth daily before breakfast. Philemon Kingdom, MD Taking Active Self  Insulin Pen Needle (PEN NEEDLES) 32G X 5 MM MISC [id number] Yes 1 Units by Does not apply route daily. Venia Carbon, MD Taking Active Self  LANTUS SOLOSTAR 100 UNIT/ML Solostar Pen [id number] Yes  TAKE 36 UNITS DAILY UNDER SKIN Venia Carbon, MD Taking Active   losartan-hydrochlorothiazide [facility]) 100-12.5 MG tablet [id number] Yes Take 1 tablet by mouth daily. Venia Carbon, MD Taking Active Self  metFORMIN (GLUCOPHAGE-XR) 500 MG 24 hr tablet [id number] Yes TAKE 2 TABLETS (1,000 MG TOTAL) BY MOUTH IN THE MORNING AND AT BEDTIME. Venia Carbon, MD Taking Active   nitroGLYCERIN (NITROSTAT) 0.4 MG SL tablet [id number] Yes Place 1 tablet (0.4 mg total) under the tongue every 5 (five) minutes as needed for chest pain.  Patient taking differently: Place 0.4 mg under the tongue daily. For arm/back pain   Venia Carbon, MD Taking Active Self  nortriptyline (PAMELOR) 50 MG capsule [id number] Yes TAKE 3 CAPSULES (150 MG TOTAL) BY MOUTH AT BEDTIME. Venia Carbon, MD Taking Active   Omega-3 Fatty Acids (FISH OIL PO) [id number] Yes Take 1 capsule by mouth daily. [provider] Taking Active Self  oxyCODONE-acetaminophen (PERCOCET) 10-325 MG tablet [id number] Yes Take 1-2 tablets by mouth every 6 (six) hours as needed for pain. M54.9 Venia Carbon, MD Taking Active   polyethylene glycol [facility] / GLYCOLAX) packet [id number] Yes Take 17 g by mouth daily as needed.  [provider] Taking Active Self            Patient Active Problem List   Diagnosis Date Noted    Vaginal discomfort 06/14/2019   Peripheral vascular disease, unspecified ([place]) 2020   Narcotic dependence ([place]) 10/29/2016   CAD S/P percutaneous coronary angioplasty/DES (RCA-2014/LAD-2016) 07/19/2016   Preventative health care 04/30/2014   Advanced directives, counseling/discussion 04/30/2014   Atherosclerotic heart disease of native coronary artery with angina pectoris ([place]) 10/21/2012   Chest pain 10/19/2012   KNEE PAIN, LEFT, CHRONIC 02/25/2010   Poorly controlled type 2 diabetes mellitus with circulatory disorder ([place]) 12/30/2009   Hyperlipidemia LDL goal <70 12/30/2009   Episodic mood disorder ([place]) 12/30/2009   Essential hypertension 12/30/2009   Chronic back pain 12/30/2009    Immunization History  Administered Date(s) Administered   Fluad Quad(high Dose 65+) 03/21/2020, 01/15/2021   Influenza Split 02/15/2012   Influenza Whole 03/11/2009   Influenza, Seasonal, Injecte, Preservative Fre 03/25/2016   Influenza,inj,Quad PF,6+ Mos 03/20/2014, 02/09/2015, 02/23/2017, 01/21/2018, 05/24/2019   Moderna Sars-Covid-2 Vaccination 11/08/2019, 12/13/2019, 08/05/2020   Pneumococcal Conjugate-13 05/30/2013   Pneumococcal Polysaccharide-23 05/18/2009   Td 07/16/2008   Zoster Recombinat (Shingrix) 09/15/2018    Conditions to be addressed/monitored:  Hypertension, Diabetes, and Depression  There are no care plans that you recently modified to display for this patient.   Current Barriers:  {pharmacybarriers:24917}  Pharmacist Clinical Goal(s):  Patient will {PHARMACYGOALCHOICES:24921} through collaboration with PharmD and provider.   Interventions: 1:1 collaboration with Venia Carbon, MD regarding development and update of comprehensive plan of care as evidenced by provider attestation and co-signature Inter-disciplinary care team collaboration (see longitudinal plan of care) Comprehensive medication review performed; medication list updated in electronic medical  record  Hypertension (BP goal <140/90) -Controlled -Current treatment: Amlodipine 2.5 mg - 1 tablet daily Carvedilol 25 mg - 1 tablet BID Losartan-HCTZ 100-12.5 mg - 1 tablet daily -Medications previously tried: none  -Current home readings: *** -{ACTIONS;DENIES/REPORTS:21021675::"Denies"} hypotensive/hypertensive symptoms -Any falls? -Educated on {CCM BP Counseling:25124} -Counseled to monitor BP at home ***, document, and provide log at future appointments -{CCMPHARMDINTERVENTION:25122}  Diabetes (A1c goal <7%) -Uncontrolled -Current medications: Metformin 500 mg ER - 2 tablets daily twice daily Lantus - Inject 36 units daily Jardiance 25 mg - 1 tablet daily  -  Medications previously tried: ***  -Cost concerns? -Current home glucose readings fasting glucose: 150-160 post prandial glucose: *** -{ACTIONS;DENIES/REPORTS:21021675::"Denies"} hypoglycemic/hyperglycemic symptoms -Current meal patterns:  breakfast: ***  lunch: ***  dinner: *** snacks: *** drinks: *** -Current exercise: *** -Educated on {CCM DM COUNSELING:25123} -Counseled to check feet daily and get yearly eye exams -{CCMPHARMDINTERVENTION:25122}  Depression/Anxiety (Goal: ***) -{US controlled/uncontrolled:25276} -Current treatment: Duloxetine 30 mg - 1 capsule daily Nortriptyline 50 mg - 3 capsules qhs (takes for sleep) -Medications previously tried/failed: *** -PHQ9: *** -GAD7: *** -Connected with *** for mental health support -Educated on {CCM mental health counseling:25127} -{CCMPHARMDINTERVENTION:25122}  Patient Goals/Self-Care Activities Patient will:  - {pharmacypatientgoals:24919}  Follow Up Plan: {CM FOLLOW UP UEKC:00349}   Medication Assistance: {MEDASSISTANCEINFO:25044}  Compliance/Adherence/Medication fill history: Care Gaps: Foot exam DEXA Scan   Star-Rating Drugs: Metformin 522m        01/04/2021       90 Atorvastatin          No fill history Losartan                      No  fill history   Patient's preferred pharmacy [facility]:  [id number] [place] [address]one: [phone]: [phone] pill box? {Yes or If no, why not?:20788} Pt endorses ***% compliance  We discussed: {Pharmacy options:24294} Patient decided to: {US Pharmacy PlJQGB:20100}Care Plan and Follow Up Patient Decision:  {F{FHQRFXJO:83254}MiDebbora DusPharmD Clinical Pharmacist [phone]

## 2021-03-19 ENCOUNTER — Telehealth: Payer: Self-pay

## 2021-03-19 NOTE — Progress Notes (Signed)
    Chronic Care Management Pharmacy Assistant   Name: Cynthia Singh  MRN: [id number] DOB: 21-Dec-1940  Reason for Encounter: CCM (Appointment Reminder)    Medications: Outpatient Encounter Medications as of 03/19/2021  Medication Sig   amLODipine (NORVASC) 2.5 MG tablet TAKE 1 TABLET BY MOUTH EVERY DAY   aspirin EC 81 MG tablet Take 81 mg by mouth daily.   atorvastatin (LIPITOR) 80 MG tablet TAKE 1/2 TABLETS (40 MG TOTAL) BY MOUTH DAILY.   carvedilol (COREG) 25 MG tablet Take 1 tablet (25 mg total) by mouth 2 (two) times daily with a meal.   clopidogrel (PLAVIX) 75 MG tablet TAKE 1 TABLET BY MOUTH EVERY DAY   Cranberry 500 MG CAPS Take 1,000 mg by mouth daily.   DULoxetine (CYMBALTA) 30 MG capsule TAKE 1 CAPSULE BY MOUTH EVERY DAY   empagliflozin (JARDIANCE) 25 MG TABS tablet Take 25 mg by mouth daily before breakfast.   Insulin Pen Needle (PEN NEEDLES) 32G X 5 MM MISC 1 Units by Does not apply route daily.   LANTUS SOLOSTAR 100 UNIT/ML Solostar Pen TAKE 36 UNITS DAILY UNDER SKIN   losartan-hydrochlorothiazide (HYZAAR) 100-12.5 MG tablet Take 1 tablet by mouth daily.   metFORMIN (GLUCOPHAGE-XR) 500 MG 24 hr tablet TAKE 2 TABLETS (1,000 MG TOTAL) BY MOUTH IN THE MORNING AND AT BEDTIME.   nitroGLYCERIN (NITROSTAT) 0.4 MG SL tablet Place 1 tablet (0.4 mg total) under the tongue every 5 (five) minutes as needed for chest pain. (Patient taking differently: Place 0.4 mg under the tongue daily. For arm/back pain)   nortriptyline (PAMELOR) 50 MG capsule TAKE 3 CAPSULES (150 MG TOTAL) BY MOUTH AT BEDTIME.   Omega-3 Fatty Acids (FISH OIL PO) Take 1 capsule by mouth daily.   oxyCODONE-acetaminophen (PERCOCET) 10-325 MG tablet Take 1-2 tablets by mouth every 6 (six) hours as needed for pain. M54.9   polyethylene glycol (MIRALAX / GLYCOLAX) packet Take 17 g by mouth daily as needed.    No facility-administered encounter medications on file as of 03/19/2021.   Cynthia Singh was contacted to  remind her of her upcoming telephone visit with Cynthia Singh on 03/24/2021 at 10:00. Patient was reminded to have all medications, supplements and any blood glucose and blood pressure readings available for review at appointment.  Are you having any problems with your medications? No  Do you have any concerns you like to discuss with the pharmacist? No  Star Rating Drugs: Medication:  Last Fill: Day Supply Metformin 500mg         01/04/2021       90 Atorvastatin      No fill history Losartan           No fill history  Cynthia Singh, CPP notified  Cynthia Singh, [place]   Time Spent: 10 Minutes

## 2021-03-24 ENCOUNTER — Telehealth: Payer: Self-pay

## 2021-03-24 ENCOUNTER — Telehealth: Payer: Medicare Other

## 2021-03-25 NOTE — Telephone Encounter (Signed)
  Chronic Care Management   Outreach Note  03/25/2021 Name: Cynthia Singh MRN: [id number] DOB: [birth year]  Referred by: Venia Carbon, MD Reason for referral : Missed CCM telephone visit  Patient was contacted for CCM visit on 03/24/21. No answer. Attempted to reach again 03/25/21, no answer. Left VM.  Follow Up Plan: CCM team will contact for rescheduling  Debbora Dus, PharmD Clinical Pharmacist [facility] [phone]  Chart Reviewed prior to visit, care plan updated.  Current Barriers:  Unable to achieve control of diabetes   Pharmacist Clinical Goal(s):  Patient will contact provider office for questions/concerns as evidenced notation of same in electronic health record through collaboration with PharmD and provider.   Interventions: 1:1 collaboration with Venia Carbon, MD regarding development and update of comprehensive plan of care as evidenced by provider attestation and co-signature Inter-disciplinary care team collaboration (see longitudinal plan of care) Comprehensive medication review performed; medication list updated in electronic medical record  Hypertension (BP goal <140/90) -Controlled -Current treatment: Amlodipine 2.5 mg - 1 tablet daily Carvedilol 25 mg - 1 tablet BID Losartan-HCTZ 100-12.5 mg - 1 tablet daily -Medications previously tried: none   Diabetes (A1c goal <7%) -Uncontrolled -Current medications: Metformin 500 mg ER - 2 tablets daily twice daily Lantus - Inject 36 units daily Jardiance 25 mg - 1 tablet daily  -Current home glucose readings fasting glucose: 150-160  Depression/Anxiety (Goal: Improve mood) -Unable to assess -Current treatment: Duloxetine 30 mg - 1 capsule daily Nortriptyline 50 mg - 3 capsules qhs (takes for sleep)   Patient Goals/Self-Care Activities Patient will:  - take medications as prescribed as evidenced by patient report and record  review  Compliance/Adherence/Medication fill history: Care Gaps: Foot exam DEXA Scan   Star-Rating Drugs: Metformin 500mg         01/04/2021       90 Atorvastatin          No fill history Losartan                      No fill history   Patient[facility]  [id number] [place] [place] - [address] [place] [address] Phone: [phone] Fax: [phone]   Debbora Dus, PharmD Clinical Pharmacist [facility] [phone]

## 2021-03-26 ENCOUNTER — Telehealth: Payer: Self-pay | Admitting: Internal Medicine

## 2021-03-26 NOTE — Telephone Encounter (Signed)
error 

## 2021-03-26 NOTE — Telephone Encounter (Signed)
Pt is calling checking on status of refill

## 2021-03-27 ENCOUNTER — Telehealth: Payer: Self-pay | Admitting: Internal Medicine

## 2021-03-27 NOTE — Telephone Encounter (Signed)
  Encourage patient to contact the pharmacy for refills or they can request refills through [place]:  Please schedule appointment if longer than 1 year  NEXT APPOINTMENT DATE:04/16/21  MEDICATION:oxyCODONE-acetaminophen (PERCOCET) 10-325 MG tablet  Is the patient out of medication?   [id number] [place], [place] - [address]  Let patient know to contact pharmacy at the end of the day to make sure medication is ready.  Please notify patient to allow 48-72 hours to process  CLINICAL FILLS OUT ALL BELOW:   LAST REFILL:  QTY:  REFILL DATE:    OTHER COMMENTS:    Okay for refill?  Please advise

## 2021-03-27 NOTE — Telephone Encounter (Signed)
Pt calling checking on the status of her medication. Pt states that she is going out of town tonight and that she would need it before she leave

## 2021-03-28 MED ORDER — OXYCODONE-ACETAMINOPHEN 10-325 MG PO TABS: 1.0000 | ORAL_TABLET | Freq: Four times a day (QID) | ORAL | 0 refills | Status: DC | PRN

## 2021-03-28 NOTE — Telephone Encounter (Signed)
The message taken yesterday at 946 was not routed to me. When she called back at 425, it was routed to me. I was out of the office by then. Also, we require a 48 hour window per her contract.    Name of Medication: Oxycodone [facility] Last Fill or Written Date and Quantity: 02-26-21 #150 Last Office Visit and Type: 01-15-21 Next Office Visit and Type: 04-16-21 Last Controlled Substance Agreement Date: 10/17/20 Last UDS:10/17/20

## 2021-04-05 ENCOUNTER — Other Ambulatory Visit: Payer: Self-pay | Admitting: Internal Medicine

## 2021-04-07 MED ORDER — OXYCODONE-ACETAMINOPHEN 10-325 MG PO TABS: 1.0000 | ORAL_TABLET | Freq: Four times a day (QID) | ORAL | 0 refills | Status: DC | PRN

## 2021-04-07 NOTE — Telephone Encounter (Addendum)
[facility] was called in. Pt states that pharmacy can not give the other 100 until Dr Silvio Pate gives the order. Pt states that she is out of medication.Pt states that she would like the rest of the medication called in.

## 2021-04-07 NOTE — Addendum Note (Signed)
Addended by: [name] I on: 04/07/2021 01:58 PM   Modules accepted: Orders

## 2021-04-16 ENCOUNTER — Ambulatory Visit: Payer: Medicare Other | Admitting: Internal Medicine

## 2021-04-23 ENCOUNTER — Ambulatory Visit: Payer: Medicare Other | Admitting: Internal Medicine

## 2021-04-25 ENCOUNTER — Other Ambulatory Visit: Payer: Self-pay | Admitting: Internal Medicine

## 2021-04-25 MED ORDER — OXYCODONE-ACETAMINOPHEN 10-325 MG PO TABS: 1.0000 | ORAL_TABLET | Freq: Four times a day (QID) | ORAL | 0 refills | Status: DC | PRN

## 2021-04-25 NOTE — Addendum Note (Signed)
Addended by: [name] I on: 04/25/2021 12:55 PM   Modules accepted: Orders

## 2021-04-25 NOTE — Addendum Note (Signed)
Addended by: Pilar Grammes on: 04/25/2021 09:58 AM   Modules accepted: Orders

## 2021-04-25 NOTE — Telephone Encounter (Signed)
  Encourage patient to contact the pharmacy for refills or they can request refills through [place]:  Please schedule appointment if longer than 1 year  NEXT APPOINTMENT DATE:  MEDICATION:oxyCODONE-acetaminophen (PERCOCET) 10-325 MG tablet   Is the patient out of medication?   [id number] [place], [place] - [address]    Let patient know to contact pharmacy at the end of the day to make sure medication is ready.  Please notify patient to allow 48-72 hours to process  CLINICAL FILLS OUT ALL BELOW:   LAST REFILL:  QTY:  REFILL DATE:    OTHER COMMENTS:    Okay for refill?  Please advise

## 2021-04-25 NOTE — Telephone Encounter (Signed)
Name of Medication: Oxycodone [facility] Last Fill or Written Date and Quantity: 02-24-21 #150 Last Office Visit and Type: 01-15-21 Next Office Visit and Type: 06-24-21 Last Controlled Substance Agreement Date: 10/17/20 Last UDS:10/17/20

## 2021-05-23 ENCOUNTER — Other Ambulatory Visit: Payer: Self-pay | Admitting: Internal Medicine

## 2021-05-23 MED ORDER — OXYCODONE-ACETAMINOPHEN 10-325 MG PO TABS: 1.0000 | ORAL_TABLET | Freq: Four times a day (QID) | ORAL | 0 refills | Status: DC | PRN

## 2021-05-23 NOTE — Telephone Encounter (Signed)
°  Encourage patient to contact the pharmacy for refills or they can request refills through [place]:  Please schedule appointment if longer than 1 year  NEXT APPOINTMENT DATE:06/24/21  MEDICATION:oxyCODONE-acetaminophen (PERCOCET) 10-325 MG tablet  Is the patient out of medication?   [id number] [place], [place] - [address]  Let patient know to contact pharmacy at the end of the day to make sure medication is ready.  Please notify patient to allow 48-72 hours to process  CLINICAL FILLS OUT ALL BELOW:   LAST REFILL:  QTY:  REFILL DATE:    OTHER COMMENTS:    Okay for refill?  Please advise

## 2021-05-23 NOTE — Telephone Encounter (Signed)
Name of Medication: Oxycodone [facility] Last Fill or Written Date and Quantity: 04-25-21 #150 Last Office Visit and Type: 01-15-21 Next Office Visit and Type: 06-24-21 Last Controlled Substance Agreement Date: 10/17/20 Last UDS:10/17/20

## 2021-05-27 ENCOUNTER — Other Ambulatory Visit: Payer: Self-pay | Admitting: Internal Medicine

## 2021-05-27 NOTE — Telephone Encounter (Signed)
Patient will need an appt

## 2021-05-29 NOTE — Telephone Encounter (Signed)
Attempted to schedule.  

## 2021-05-29 NOTE — Telephone Encounter (Signed)
Scheduled

## 2021-06-11 ENCOUNTER — Other Ambulatory Visit: Payer: Self-pay | Admitting: Internal Medicine

## 2021-06-11 NOTE — Telephone Encounter (Signed)
Can you refill during appt tomorrow if appropriate please? Thanks!

## 2021-06-12 ENCOUNTER — Ambulatory Visit: Payer: Medicare Other | Admitting: Medical

## 2021-06-19 ENCOUNTER — Telehealth: Payer: Self-pay

## 2021-06-19 NOTE — Progress Notes (Signed)
Chronic Care Management Pharmacy Assistant   Name: Cynthia Singh  MRN: [id number] DOB: 07/25/1940  Reason for Encounter: CCM (Appointment Reminder)   Medications: Outpatient Encounter Medications as of 06/19/2021  Medication Sig   amLODipine (NORVASC) 2.5 MG tablet TAKE 1 TABLET BY MOUTH DAILY PT NEEDS TO MAKE APPT WITH PROVIDER TO GET FURTHER REFILLS- 1ST ATTEMPT   aspirin EC 81 MG tablet Take 81 mg by mouth daily.   atorvastatin (LIPITOR) 80 MG tablet TAKE 1/2 TABLETS (40 MG TOTAL) BY MOUTH DAILY.   carvedilol (COREG) 25 MG tablet Take 1 tablet (25 mg total) by mouth 2 (two) times daily with a meal.   clopidogrel (PLAVIX) 75 MG tablet TAKE 1 TABLET BY MOUTH EVERY DAY   Cranberry 500 MG CAPS Take 1,000 mg by mouth daily.   DULoxetine (CYMBALTA) 30 MG capsule TAKE 1 CAPSULE BY MOUTH EVERY DAY   empagliflozin (JARDIANCE) 25 MG TABS tablet Take 25 mg by mouth daily before breakfast.   Insulin Pen Needle (PEN NEEDLES) 32G X 5 MM MISC 1 Units by Does not apply route daily.   LANTUS SOLOSTAR 100 UNIT/ML Solostar Pen TAKE 36 UNITS DAILY UNDER SKIN   losartan-hydrochlorothiazide (HYZAAR) 100-12.5 MG tablet Take 1 tablet by mouth daily.   metFORMIN (GLUCOPHAGE-XR) 500 MG 24 hr tablet TAKE 2 TABLETS (1,000 MG TOTAL) BY MOUTH IN THE MORNING AND AT BEDTIME.   nitroGLYCERIN (NITROSTAT) 0.4 MG SL tablet Place 1 tablet (0.4 mg total) under the tongue every 5 (five) minutes as needed for chest pain. (Patient taking differently: Place 0.4 mg under the tongue daily. For arm/back pain)   nortriptyline (PAMELOR) 50 MG capsule TAKE 3 CAPSULES (150 MG TOTAL) BY MOUTH AT BEDTIME.   Omega-3 Fatty Acids (FISH OIL PO) Take 1 capsule by mouth daily.   oxyCODONE-acetaminophen (PERCOCET) 10-325 MG tablet Take 1-2 tablets by mouth every 6 (six) hours as needed for pain. M54.9   polyethylene glycol (MIRALAX / GLYCOLAX) packet Take 17 g by mouth daily as needed.    No facility-administered encounter medications  on file as of 06/19/2021.   Cynthia Singh was contacted to remind of upcoming telephone visit with Cynthia Singh on 06/24/2021 at 3:30 pm. Patient was reminded to have all medications, supplements and any blood glucose and blood pressure readings available for review at appointment.   Are you having any problems with your medications? No   Do you have any concerns you like to discuss with the pharmacist? No  Star Rating Drugs: Medication:    Last Fill: Day Supply Metformin 500 mg   05/01/2021 90   Lantus 100 units   11/10/2020 30   Atorvastatin 80 mg   02/29/2020 90  Losartan HCTZ 100-12.5 mg  No record for 2 years  Jardiance     01/19/2019 never picked up [facility]   Encounter on 01/15/2022 (last office visit with PCP) states patient should be taking the following:  All fill dates [facility]   Back Pain   Oxycodone 05/23/2021 30ds  Mood Disorder  Duloxetine 05/05/2021 90ds  Neuropathy  Amitriptyline - No record of being filled in past two years  Diabetes  Jardiance - Received RX on 01/19/2019 - Patient never picked up   Insulin - 11/10/2020 30ds  Metformin - 05/01/2021 90ds  Atherosclerotic Heart Disease of native coronary artery   Atorvastatin - No record of being filled in past two years  Plavix 05/05/2021 90ds  Carvedilol -  No record of being filled in past two years  ASA Losartan - No record of being filled in past two years  Cynthia Singh, CPP notified  Cynthia Singh, [place] [phone]  Time Spent: 25 Minutes

## 2021-06-20 ENCOUNTER — Other Ambulatory Visit: Payer: Self-pay | Admitting: Internal Medicine

## 2021-06-20 MED ORDER — OXYCODONE-ACETAMINOPHEN 10-325 MG PO TABS: 1.0000 | ORAL_TABLET | Freq: Four times a day (QID) | ORAL | 0 refills | Status: DC | PRN

## 2021-06-20 NOTE — Telephone Encounter (Signed)
°  Encourage patient to contact the pharmacy for refills or they can request refills through [place]:  Please schedule appointment if longer than 1 year  NEXT APPOINTMENT DATE:  MEDICATION:oxyCODONE-acetaminophen (PERCOCET) 10-325 MG tablet  Is the patient out of medication?   [id number] [place], [place] - [address]  Let patient know to contact pharmacy at the end of the day to make sure medication is ready.  Please notify patient to allow 48-72 hours to process  CLINICAL FILLS OUT ALL BELOW:   LAST REFILL:  QTY:  REFILL DATE:    OTHER COMMENTS:    Okay for refill?  Please advise

## 2021-06-20 NOTE — Telephone Encounter (Signed)
Spoke to pt. Made appt with Dr Silvio Pate 07-10-21. She is aware she must keep this appt.

## 2021-06-20 NOTE — Telephone Encounter (Signed)
Name of Medication: Oxycodone Name of [facility] Last Fill or Written Date and Quantity: 04-25-21 #150 Last Office Visit and Type: 01-15-21 Next Office Visit and Type: 05-23-21 Last Controlled Substance Agreement Date: 10/17/20 Last UDS:10/17/20

## 2021-06-20 NOTE — Telephone Encounter (Signed)
She is in arrears on appointments Have her set up appt in the next month or I can't do next refill

## 2021-06-24 ENCOUNTER — Other Ambulatory Visit: Payer: Self-pay

## 2021-06-24 ENCOUNTER — Ambulatory Visit (INDEPENDENT_AMBULATORY_CARE_PROVIDER_SITE_OTHER): Payer: Medicare Other

## 2021-06-24 DIAGNOSIS — E785 Hyperlipidemia, unspecified: Secondary | ICD-10-CM

## 2021-06-24 DIAGNOSIS — I10 Essential (primary) hypertension: Secondary | ICD-10-CM

## 2021-06-24 DIAGNOSIS — E1159 Type 2 diabetes mellitus with other circulatory complications: Secondary | ICD-10-CM

## 2021-06-24 DIAGNOSIS — E1165 Type 2 diabetes mellitus with hyperglycemia: Secondary | ICD-10-CM

## 2021-06-24 NOTE — Progress Notes (Signed)
Chronic Care Management Pharmacy Note 06/24/2021 Name:  Cynthia Singh MRN:  [id number] DOB:  23-Aug-1940  Summary: CCM follow up visit. Pt not taking multiple medications on med list. HTN - only taking amlodipine. Has not filled carvedilol or losartan/HCTZ in > 1 year per pharmacy. Will remove from med list for now. PCP may restart pending BP check at upcoming visit. No home readings today. DM - only taking metformin. Has been off Lantus for 2-3 weeks and never started Jardiance due to cost. BG 200-300s fasting. Will apply for LIS to help cost. HLD - off statin, last filled October 2021. Pt to resume taking atorvastatin, called pharmacy for refill. Pt affirms adherence to all other medications as prescribed.  Recommendations/Changes made from today's visit: Re-eval BP at upcoming PCP visit  Care Gaps - Consider ordering DEXA scan  Care Gaps - Update A1c and foot exam   Plan: CCM follow up in 1 week to apply for LIS (Lantus and Jardiance cost) PCP visit 07/10/21   Subjective: Cynthia Singh is an 81 y.o. year old female who is a primary patient of Cynthia Carbon, MD.  The CCM team was consulted for assistance with disease management and care coordination needs.    Engaged with patient by telephone for follow up visit in response to provider referral for pharmacy case management and/or care coordination services.   Consent to Services:  The patient was given information about Chronic Care Management services, agreed to services, and Singh verbal consent prior to initiation of services.  Please see initial visit note for detailed documentation.   Patient Care Team: Cynthia Carbon, MD as PCP - General (Internal Medicine) End, Cynthia Gave, MD as PCP - Cardiology (Cardiology) Cynthia Singh, [facility] as Pharmacist (Pharmacist)  Recent office visits: 01/15/21 - [name], MD - Pt presented for annual exam. Continue current medications. Follow up 3 months.  Recent consult  visits: None in previous 6 months  Hospital visits: None in previous 6 months  Objective:  Lab Results  Component Value Date   CREATININE 0.86 10/17/2020   BUN 14 10/17/2020   GFR 65.15 10/17/2020   GFRNONAA >60 09/20/2019   GFRAA >60 09/20/2019   NA 136 10/17/2020   K 4.3 10/17/2020   CALCIUM 9.3 10/17/2020   CO2 25 10/17/2020   GLUCOSE 217 (H) 10/17/2020    Lab Results  Component Value Date/Time   HGBA1C 8.1 (A) 10/17/2020 03:11 PM   HGBA1C 8.2 (A) 03/21/2020 03:38 PM   HGBA1C 13.8 (H) 05/24/2019 12:01 PM   HGBA1C 11.6 (H) 10/19/2017 10:51 AM   GFR 65.15 10/17/2020 03:56 PM   GFR 67.77 05/24/2019 12:01 PM    Last diabetic Eye exam:  Lab Results  Component Value Date/Time   HMDIABEYEEXA No Retinopathy 01/15/2021 12:00 AM    Last diabetic Foot exam:  Lab Results  Component Value Date/Time   HMDIABFOOTEX done 05/24/2019 12:00 AM     Lab Results  Component Value Date   CHOL 199 10/17/2020   HDL 36.30 (L) 10/17/2020   LDLCALC 62 09/05/2019   LDLDIRECT 88.0 10/17/2020   TRIG (H) 10/17/2020    834.0 Triglyceride is over 400; calculations on Lipids are invalid.   CHOLHDL 5 10/17/2020    Hepatic Function Latest Ref Rng & Units 10/17/2020 09/04/2019 05/24/2019  Total Protein 6.0 - 8.3 g/dL 6.9 6.4(L) 7.6  Albumin 3.5 - 5.2 g/dL 4.3 3.4(L) 4.4  AST 0 - 37 U/L '18 20 11  ' ALT 0 - 35  U/L '17 14 13  ' Alk Phosphatase 39 - 117 U/L 79 74 86  Total Bilirubin 0.2 - 1.2 mg/dL 0.2 0.8 0.5  Bilirubin, Direct 0.0 - 0.2 mg/dL - 0.2 -    Lab Results  Component Value Date/Time   TSH 1.572 10/19/2012 07:44 PM   TSH 1.75 10/05/2011 01:48 PM   FREET4 0.87 10/17/2020 03:56 PM   FREET4 0.95 04/06/2018 09:53 AM    CBC Latest Ref Rng & Units 10/17/2020 09/20/2019 09/06/2019  WBC 4.0 - 10.5 K/uL 10.1 10.2 12.5(H)  Hemoglobin 12.0 - 15.0 g/dL 13.7 13.2 14.2  Hematocrit 36.0 - 46.0 % 39.5 39.3 41.1  Platelets 150.0 - 400.0 K/uL 277.0 226 224    No results found for: VD25OH  Clinical  ASCVD: Yes  The ASCVD Risk score (Arnett DK, et al., 2019) failed to calculate for the following reasons:   The patient has a prior MI or stroke diagnosis    Depression screen [facility] 2/9 01/15/2021 10/29/2016 04/30/2014  Decreased Interest 0 0 0  Down, Depressed, Hopeless 0 1 0  PHQ - 2 Score 0 1 0  Some recent data might be hidden     Social History   Tobacco Use  Smoking Status Never  Smokeless Tobacco Never   BP Readings from Last 3 Encounters:  01/15/21 136/84  10/17/20 118/74  07/11/20 140/76   Pulse Readings from Last 3 Encounters:  01/15/21 94  10/17/20 78  07/11/20 91   Wt Readings from Last 3 Encounters:  01/15/21 197 lb (89.4 kg)  10/17/20 195 lb (88.5 kg)  07/11/20 191 lb 12 oz (87 kg)   BMI Readings from Last 3 Encounters:  01/15/21 29.95 kg/m  10/17/20 29.65 kg/m  07/11/20 29.16 kg/m    Assessment/Interventions: Review of patient past medical history, allergies, medications, health status, including review of consultants reports, laboratory and other test data, was performed as part of comprehensive evaluation and provision of chronic care management services.   SDOH:  (Social Determinants of Health) assessments and interventions performed: Yes SDOH Interventions    Flowsheet Row Most Recent Value  SDOH Interventions   Financial Strain Interventions Other (Comment)  [Unable to afford Lantus $100/box]  Transportation Interventions Intervention Not Indicated      SDOH Screenings   Alcohol Screen: Not on file  Depression (PHQ2-9): Low Risk    PHQ-2 Score: 0  Financial Resource Strain: Medium Risk   Difficulty of Paying Living Expenses: Somewhat hard  Food Insecurity: Not on file  Housing: Not on file  Physical Activity: Not on file  Social Connections: Not on file  Stress: Not on file  Tobacco Use: Low Risk    Smoking Tobacco Use: Never   Smokeless Tobacco Use: Never   Passive Exposure: Not on file  Transportation Needs: No Transportation  Needs   Lack of Transportation (Medical): No   Lack of Transportation (Non-Medical): No    CCM Care Plan  No Known Allergies  Medications Reviewed Today     Reviewed by Cynthia Singh, [facility] (Pharmacist) on 06/26/21 at 1551  Med List Status: <None>   Medication Order Taking? Sig Documenting Provider Last Dose Status Informant  amLODipine (NORVASC) 2.5 MG tablet [id number] Yes TAKE 1 TABLET BY MOUTH DAILY PT NEEDS TO MAKE APPT WITH PROVIDER TO GET FURTHER REFILLS- 1ST ATTEMPT End, Cynthia Gave, MD Taking Active   aspirin EC 81 MG tablet [id number] Yes Take 81 mg by mouth daily. [provider] Taking Active Self  atorvastatin (LIPITOR) 80 MG tablet [id number]  No TAKE 1/2 TABLETS (40 MG TOTAL) BY MOUTH DAILY.  Patient not taking: Reported on 06/24/2021   Cynthia Carbon, MD Not Taking Active   clopidogrel (PLAVIX) 75 MG tablet [id number] Yes TAKE 1 TABLET BY MOUTH EVERY DAY Cynthia Carbon, MD Taking Active   Cranberry 500 MG CAPS [id number] Yes Take 1,000 mg by mouth daily. [provider] Taking Active Self  DULoxetine (CYMBALTA) 30 MG capsule [id number] Yes TAKE 1 CAPSULE BY MOUTH EVERY DAY Cynthia Carbon, MD Taking Active   Patient not taking:  Discontinued 06/26/21 1551 (Patient Preference) Insulin Pen Needle (PEN NEEDLES) 32G X 5 MM MISC [id number] Yes 1 Units by Does not apply route daily. Cynthia Carbon, MD Taking Active Self  LANTUS SOLOSTAR 100 UNIT/ML Solostar Pen [id number] No TAKE 36 UNITS DAILY UNDER SKIN  Patient not taking: Reported on 06/24/2021   Cynthia Carbon, MD Not Taking Active   Menthol, Topical Analgesic, (ASPERCREME MAX ROLL-ON EX) [id number] Yes Apply topically. [provider] Taking Active   metFORMIN (GLUCOPHAGE-XR) 500 MG 24 hr tablet [id number] Yes TAKE 2 TABLETS (1,000 MG TOTAL) BY MOUTH IN THE MORNING AND AT BEDTIME. Cynthia Carbon, MD Taking Active   nitroGLYCERIN (NITROSTAT) 0.4 MG SL tablet [id number] Yes Place 1 tablet (0.4 mg  total) under the tongue every 5 (five) minutes as needed for chest pain.  Patient taking differently: Place 0.4 mg under the tongue daily. For arm/back pain   Cynthia Carbon, MD Taking Active Self  nortriptyline (PAMELOR) 50 MG capsule [id number] Yes TAKE 3 CAPSULES (150 MG TOTAL) BY MOUTH AT BEDTIME. Cynthia Carbon, MD Taking Active   Omega-3 Fatty Acids (FISH OIL PO) [id number] Yes Take 1 capsule by mouth daily. [provider] Taking Active Self  oxyCODONE-acetaminophen (PERCOCET) 10-325 MG tablet [id number] Yes Take 1-2 tablets by mouth every 6 (six) hours as needed for pain. M54.9 Cynthia Carbon, MD Taking Active   polyethylene glycol [facility] / GLYCOLAX) packet [id number] Yes Take 17 g by mouth daily as needed.  [provider] Taking Active Self  pyridOXINE (VITAMIN B-6) 100 MG tablet [id number] Yes Take 100 mg by mouth daily. [provider] Taking Active             Patient Active Problem List   Diagnosis Date Noted   Vaginal discomfort 06/14/2019   Peripheral vascular disease, unspecified ([place]) 2020   Narcotic dependence ([place]) 10/29/2016   CAD S/P percutaneous coronary angioplasty/DES (RCA-2014/LAD-2016) 07/19/2016   Preventative health care 04/30/2014   Advanced directives, counseling/discussion 04/30/2014   Atherosclerotic heart disease of native coronary artery with angina pectoris ([place]) 10/21/2012   Chest pain 10/19/2012   KNEE PAIN, LEFT, CHRONIC 02/25/2010   Poorly controlled type 2 diabetes mellitus with circulatory disorder ([place]) 12/30/2009   Hyperlipidemia LDL goal <70 12/30/2009   Episodic mood disorder ([place]) 12/30/2009   Essential hypertension 12/30/2009   Chronic back pain 12/30/2009    Immunization History  Administered Date(s) Administered   Fluad Quad(high Dose 65+) 03/21/2020, 01/15/2021   Influenza Split 02/15/2012   Influenza Whole 03/11/2009   Influenza, Seasonal, Injecte, Preservative Fre 03/25/2016    Influenza,inj,Quad PF,6+ Mos 03/20/2014, 02/09/2015, 02/23/2017, 01/21/2018, 05/24/2019   Moderna Sars-Covid-2 Vaccination 11/08/2019, 12/13/2019, 08/05/2020   Pneumococcal Conjugate-13 05/30/2013   Pneumococcal Polysaccharide-23 05/18/2009   Td 07/16/2008   Zoster Recombinat (Shingrix) 09/15/2018    Conditions to be addressed/monitored:  Hypertension, Hyperlipidemia, Diabetes, and Depression  Care Plan : [place]  Updates  made by Cynthia Singh, [place] since 06/26/2021 12:00 AM     Problem: CHL AMB "PATIENT-SPECIFIC PROBLEM"      Long-Range Goal: Patient Stated   Start Date: 06/24/2021  Priority: High  Note:     Current Barriers:  Unable to afford medications Not taking medications as prescribed    Pharmacist Clinical Goal(s):  Patient will contact provider office for questions/concerns as evidenced notation of same in electronic health record through collaboration with PharmD and provider.    Interventions: 1:1 collaboration with Cynthia Carbon, MD regarding development and update of comprehensive plan of care as evidenced by provider attestation and co-signature Inter-disciplinary care team collaboration (see longitudinal plan of care) Comprehensive medication review performed; medication list updated in electronic medical record   Hypertension (BP goal <140/90) -Query controlled, pt is not taking carvedilol or losartan-HCTZ. She is not sure last fill date, per pharmacy > 1 year. No home blood pressure readings. She has a monitor but has not checked. She continues amlodipine monotherapy. -Current treatment: Amlodipine 2.5 mg - 1 tablet daily Carvedilol 25 mg - 1 tablet BID (not taking) Losartan-HCTZ 100-12.5 mg - 1 tablet daily (not taking) -Medications previously tried: none  -Pt has a home BP monitor but has not checked recently.  -Recommended to continue amlodipine only for now; Re-eval blood pressure and need for additional medication at PCP visit this  month.    Diabetes (A1c goal <7%) -Uncontrolled, A1c 8.1% (10/2020) -Pt unable to afford Lantus, has been out for 2-3 weeks. She never picked up Jardiance due to cost. Not sure she has ever taken [place]. She is taking metformin as prescribed.  -Current medications: Metformin 500 mg ER - 2 tablets daily twice daily Lantus - Inject 36 units daily (not taking) Jardiance 25 mg - 1 tablet daily (not taking) -Current home glucose readings fasting glucose: she has not checked in 2-3 weeks, prior to this it was 200-300 in morning (without Lantus) -Pt has been stressed lately, not watching diet. -Recommended to continue current medication; Will apply for LIS to help with medication cost. Check blood glucose daily for 1 week.  Hyperlipidemia: (LDL goal < 70) -Not ideally controlled -Pt is out of atorvastatin. Not sure how long she has been out. -Current treatment: Atorvastatin 40 mg - 1/2 tablet daily (not taking) -Medications previously tried: none reported  -Educated on Benefits of statin for ASCVD risk reduction; -Recommended to resume atorvastatin. Will call pharmacy for patient.    Depression/Anxiety (Goal: Improve mood) -Not ideally controlled, pt reports increase stress lately due to financial stressors. She does confirm adherence to duloxetine and nortriptyline.  -Current treatment: Duloxetine 30 mg - 1 capsule daily Nortriptyline 50 mg - 3 capsules qhs (takes for sleep)  -Recommended to continue current medication; Will assist with med cost concerns.    Patient Goals/Self-Care Activities Patient will:  - take medications as prescribed as evidenced by patient report and record review  Follow Up Plan: Telephone follow up appointment with care management team member scheduled for: - CCM PharmD follow up 1 week to apply for LIS       Medication Assistance:  Medication cost unaffordable. Will apply for LIS.  Compliance/Adherence/Medication fill history: Care Gaps: Foot exam  due DEXA Scan due A1c due   Star-Rating Drugs: Medication:  Last Fill:         Day Supply Metformin 500 mg                               05/01/2021      90                     Lantus 100 units                                 11/10/2020      30                     Past due  Atorvastatin 80 mg                             02/29/2020      90    Past due  Losartan HCTZ 100-12.5 mg              No record for 2 years    Past due  Jardiance                                            01/19/2019 never picked up  Past due  Amlodipine 2.5 mg    06/13/2021 90      Patient[facility]  [id number] [place] [place] [id number]: [phone]: [phone] Uses pill box? No - she has one but has not filled it recently Pt endorses 100% compliance to the medications she has. She is not taking several medications on her med list for unknown reason and not taking Lantus due to cost.   Care Plan and Follow Up Patient Decision:  Patient agrees to Care Plan and Follow-up.  MDebbora Singh PharmD Clinical Pharmacist Practitioner [phone]

## 2021-06-26 ENCOUNTER — Telehealth: Payer: Self-pay

## 2021-06-26 NOTE — Patient Instructions (Signed)
Dear Burley Saver,  Below is a summary of the goals we discussed during our follow up appointment on June 24, 2021. Please contact me anytime with questions or concerns.   Visit Information   Patient Care Plan: CCM Pharmacy Care Plan     Problem Identified: CHL AMB "PATIENT-SPECIFIC PROBLEM"      Long-Range Goal: Patient Stated   Start Date: 06/24/2021  Priority: High  Note:     Current Barriers:  Unable to afford medications Not taking medications as prescribed    Pharmacist Clinical Goal(s):  Patient will contact provider office for questions/concerns as evidenced notation of same in electronic health record through collaboration with PharmD and provider.    Interventions: 1:1 collaboration with Venia Carbon, MD regarding development and update of comprehensive plan of care as evidenced by provider attestation and co-signature Inter-disciplinary care team collaboration (see longitudinal plan of care) Comprehensive medication review performed; medication list updated in electronic medical record   Hypertension (BP goal <140/90) -Query controlled, pt is not taking carvedilol or losartan-HCTZ. She is not sure last fill date, per pharmacy > 1 year. No home blood pressure readings. She has a monitor but has not checked. She continues amlodipine monotherapy. -Current treatment: Amlodipine 2.5 mg - 1 tablet daily Carvedilol 25 mg - 1 tablet BID (not taking) Losartan-HCTZ 100-12.5 mg - 1 tablet daily (not taking) -Medications previously tried: none  -Pt has a home BP monitor but has not checked recently.  -Recommended to continue amlodipine only for now; Re-eval blood pressure and need for additional medication at PCP visit this month.    Diabetes (A1c goal <7%) -Uncontrolled, A1c 8.1% (10/2020) -Pt unable to afford Lantus, has been out for 2-3 weeks. She never picked up Jardiance due to cost. Not sure she has ever taken [place]. She is taking metformin as prescribed.   -Current medications: Metformin 500 mg ER - 2 tablets daily twice daily Lantus - Inject 36 units daily (not taking) Jardiance 25 mg - 1 tablet daily (not taking) -Current home glucose readings fasting glucose: she has not checked in 2-3 weeks, prior to this it was 200-300 in morning (without Lantus) -Pt has been stressed lately, not watching diet. -Recommended to continue current medication; Will apply for LIS to help with medication cost. Check blood glucose daily for 1 week.  Hyperlipidemia: (LDL goal < 70) -Not ideally controlled -Pt is out of atorvastatin. Not sure how long she has been out. -Current treatment: Atorvastatin 40 mg - 1/2 tablet daily (not taking) -Medications previously tried: none reported  -Educated on Benefits of statin for ASCVD risk reduction; -Recommended to resume atorvastatin. Will call pharmacy for patient.    Depression/Anxiety (Goal: Improve mood) -Not ideally controlled, pt reports increase stress lately due to financial stressors. She does confirm adherence to duloxetine and nortriptyline.  -Current treatment: Duloxetine 30 mg - 1 capsule daily Nortriptyline 50 mg - 3 capsules qhs (takes for sleep)  -Recommended to continue current medication; Will assist with med cost concerns.    Patient Goals/Self-Care Activities Patient will:  - take medications as prescribed as evidenced by patient report and record review  Follow Up Plan: Telephone follow up appointment with care management team member scheduled for: - CCM PharmD follow up 1 week to apply for LIS        Patient verbalizes understanding of instructions and care plan provided today and agrees to view in [place]. Active MyChart status confirmed with patient.    Debbora Dus, PharmD Clinical Pharmacist  Practitioner [facility] [phone]

## 2021-06-26 NOTE — Telephone Encounter (Addendum)
Can you call patient's pharmacy and ask them to fill atorvastatin. Let patient know she will need to pick this up. Can you also ask how much is her copay for [facility] ([facility]).   We will hold off on resuming her blood pressure medications until we can check her BP in clinic. We will apply for Extra Help to lower her Lantus cost.  Debbora Dus, PharmD Clinical Pharmacist Practitioner [facility] at [facility] [phone]

## 2021-06-30 ENCOUNTER — Telehealth: Payer: Self-pay

## 2021-06-30 DIAGNOSIS — E785 Hyperlipidemia, unspecified: Secondary | ICD-10-CM

## 2021-06-30 MED ORDER — ATORVASTATIN CALCIUM 40 MG PO TABS: 40.0000 mg | ORAL_TABLET | Freq: Every day | ORAL | 3 refills | Status: DC

## 2021-06-30 NOTE — Progress Notes (Signed)
° ° °  Chronic Care Management Pharmacy Assistant   Name: AJANEE BUREN  MRN: [id number] DOB: [birth year]  Reason for Encounter: CCM (Atorvastatin refill)   [facility], [place] to refill patient's Atorvastatin 80 mg. Pharmacy needs a new prescription for any fills of Atorvastatin 80 mg.  Copay for Lantus will be $70.00.  Debbora Dus, CPP notified  [name], [place] Clinical Pharmacy Assistant [phone]  Time Spent: 5 Minutes

## 2021-06-30 NOTE — Progress Notes (Signed)
Error

## 2021-06-30 NOTE — Addendum Note (Signed)
Addended by: Debbora Dus on: 06/30/2021 12:01 PM   Modules accepted: Orders

## 2021-06-30 NOTE — Telephone Encounter (Signed)
Rx sent for atorvastatin. Ill let her know about Lantus copay tomorrow during our visit.

## 2021-07-01 ENCOUNTER — Telehealth: Payer: Self-pay

## 2021-07-01 ENCOUNTER — Telehealth: Payer: Medicare Other

## 2021-07-01 NOTE — Telephone Encounter (Signed)
Contacted patient to apply for LIS. After further discussion, her annual income is about limits. She would like to apply for [employer] assistance instead. Will have CCM team complete forms so patient can sign at PCP visit next week. She will ask for pharmacist when she checks in.   Debbora Dus, PharmD Clinical Pharmacist Practitioner [facility] [phone]

## 2021-07-01 NOTE — Telephone Encounter (Signed)
Reviewed and sent to [place] to print.

## 2021-07-01 NOTE — Progress Notes (Signed)
Patient assistance forms for Lantus (Sanofi) have been completed and uploaded.   Debbora Dus, CPP notified  [name], [place] Clinical Pharmacy Assistant [phone]  Time Spent: 30 Minutes

## 2021-07-02 NOTE — Telephone Encounter (Signed)
Printed [employer] (Lantus) form. Placed in front office "patient documents" file for patient to come sign.  Please return to me once complete.

## 2021-07-09 NOTE — Progress Notes (Signed)
Called patient to remind her to come by the office and sign her [employer] Lantus application. No answer; left message.   Charlene Brooke, CPP notified  [name], [place] Clinical Pharmacy Assistant [phone]  Time Spent: 3 Minutes other patient time

## 2021-07-10 ENCOUNTER — Ambulatory Visit: Payer: Medicare Other | Admitting: Internal Medicine

## 2021-07-14 ENCOUNTER — Ambulatory Visit: Payer: Medicare Other | Admitting: Medical

## 2021-07-14 ENCOUNTER — Other Ambulatory Visit: Payer: Self-pay

## 2021-07-14 ENCOUNTER — Ambulatory Visit (INDEPENDENT_AMBULATORY_CARE_PROVIDER_SITE_OTHER): Payer: Medicare Other | Admitting: Internal Medicine

## 2021-07-14 ENCOUNTER — Encounter: Payer: Self-pay | Admitting: Internal Medicine

## 2021-07-14 VITALS — BP 132/84 | HR 88 | Temp 97.4°F | Ht 68.0 in | Wt 189.0 lb

## 2021-07-14 DIAGNOSIS — M549 Dorsalgia, unspecified: Secondary | ICD-10-CM | POA: Diagnosis not present

## 2021-07-14 DIAGNOSIS — E1165 Type 2 diabetes mellitus with hyperglycemia: Secondary | ICD-10-CM

## 2021-07-14 DIAGNOSIS — K2100 Gastro-esophageal reflux disease with esophagitis, without bleeding: Secondary | ICD-10-CM | POA: Diagnosis not present

## 2021-07-14 DIAGNOSIS — F1120 Opioid dependence, uncomplicated: Secondary | ICD-10-CM

## 2021-07-14 DIAGNOSIS — G8929 Other chronic pain: Secondary | ICD-10-CM | POA: Diagnosis not present

## 2021-07-14 DIAGNOSIS — I25119 Atherosclerotic heart disease of native coronary artery with unspecified angina pectoris: Secondary | ICD-10-CM

## 2021-07-14 DIAGNOSIS — E1159 Type 2 diabetes mellitus with other circulatory complications: Secondary | ICD-10-CM | POA: Diagnosis not present

## 2021-07-14 DIAGNOSIS — K219 Gastro-esophageal reflux disease without esophagitis: Secondary | ICD-10-CM | POA: Insufficient documentation

## 2021-07-14 LAB — POCT GLYCOSYLATED HEMOGLOBIN (HGB A1C): Hemoglobin A1C: 8.6 % — AB (ref 4.0–5.6)

## 2021-07-14 MED ORDER — PANTOPRAZOLE SODIUM 40 MG PO TBEC: 40.0000 mg | DELAYED_RELEASE_TABLET | Freq: Every day | ORAL | 3 refills | Status: DC

## 2021-07-14 NOTE — Assessment & Plan Note (Signed)
PDMP reviewed No concerns 

## 2021-07-14 NOTE — Progress Notes (Signed)
Subjective:    Patient ID: Cynthia Singh, female    DOB: 1941-04-10, 81 y.o.   MRN: [id number]  HPI Here for follow up of chronic pain, diabetes and other chronic health conditions  Doing okay Not feeling well today--but no overall  Still needs the oxycodone 5 times every day Lots of joint swelling and pain Able to do own personal care Pain is now limiting her ability to do house work (son helps some but they don't live that close)  Checking sugars every day Has been over 200 daily Has been taking metformin twice a day No lantus due to cost (had been taking 36)  Had one spell of chest pain last month--just sitting Better with one nitroglycerin No SOB No dizziness or syncope  Has noticed some trouble swallowing pills Occasional heartburn  Current Outpatient Medications on File Prior to Visit  Medication Sig Dispense Refill   amLODipine (NORVASC) 2.5 MG tablet TAKE 1 TABLET BY MOUTH DAILY PT NEEDS TO MAKE APPT WITH PROVIDER TO GET FURTHER REFILLS- 1ST ATTEMPT 90 tablet 1   aspirin EC 81 MG tablet Take 81 mg by mouth daily.     atorvastatin (LIPITOR) 40 MG tablet Take 1 tablet (40 mg total) by mouth daily. 90 tablet 3   clopidogrel (PLAVIX) 75 MG tablet TAKE 1 TABLET BY MOUTH EVERY DAY 90 tablet 3   Cranberry 500 MG CAPS Take 1,000 mg by mouth daily.     DULoxetine (CYMBALTA) 30 MG capsule TAKE 1 CAPSULE BY MOUTH EVERY DAY 90 capsule 2   Insulin Pen Needle (PEN NEEDLES) 32G X 5 MM MISC 1 Units by Does not apply route daily. 100 each 3   Menthol, Topical Analgesic, (ASPERCREME MAX ROLL-ON EX) Apply topically.     metFORMIN (GLUCOPHAGE-XR) 500 MG 24 hr tablet TAKE 2 TABLETS (1,000 MG TOTAL) BY MOUTH IN THE MORNING AND AT BEDTIME. 360 tablet 3   nitroGLYCERIN (NITROSTAT) 0.4 MG SL tablet Place 1 tablet (0.4 mg total) under the tongue every 5 (five) minutes as needed for chest pain. (Patient taking differently: Place 0.4 mg under the tongue daily. For arm/back pain) 30 tablet 0    nortriptyline (PAMELOR) 50 MG capsule TAKE 3 CAPSULES (150 MG TOTAL) BY MOUTH AT BEDTIME. 270 capsule 3   Omega-3 Fatty Acids (FISH OIL PO) Take 1 capsule by mouth daily.     oxyCODONE-acetaminophen (PERCOCET) 10-325 MG tablet Take 1-2 tablets by mouth every 6 (six) hours as needed for pain. M54.9 150 tablet 0   polyethylene glycol (MIRALAX / GLYCOLAX) packet Take 17 g by mouth daily as needed.      LANTUS SOLOSTAR 100 UNIT/ML Solostar Pen TAKE 36 UNITS DAILY UNDER SKIN (Patient not taking: Reported on 06/24/2021) 15 mL 11   pyridOXINE (VITAMIN B-6) 100 MG tablet Take 100 mg by mouth daily.     No current facility-administered medications on file prior to visit.    No Known Allergies  Past Medical History:  Diagnosis Date   CAD (coronary artery disease)    a. Remote nonobstructive disease but in 10/2012 Cath/PCI: s/p DES to RCA. b. cath 02/08/15 DES to prox LAD and balloon angioplasty of ost D1, EF 55-65%; c. 08/2019 PCI/DES prox/distal RCA (overlapping prev RCA stent). Prev placed RCA/LAD stents patent.   Concussion    SAH AFTER FALL 3/18   Depression    Diabetes mellitus, type 2 ([place])    Dyslipidemia    Episodic mood disorder (HCC)    Hypertension  Myocardial infarction [facility])    2014   Obesity    Osteoarthritis of spine    knees also   Peripheral vascular disease, unspecified ([place]) 2020   severe disease on home screening by insurance    Past Surgical History:  Procedure Laterality Date   [place] N/A 02/08/2015   Procedure: Left Heart Cath and Coronary Angiography;  Surgeon: Sherren Mocha, MD;  Location: [place] CV LAB;  Service: Cardiovascular;  Laterality: N/A;   CARDIAC CATHETERIZATION N/A 02/08/2015   Procedure: Coronary Stent Intervention;  Surgeon: Sherren Mocha, MD;  Location: [place] CV LAB;  Service: Cardiovascular;  Laterality: N/A;   CARDIAC CATHETERIZATION N/A 02/08/2015   Procedure:  Intravascular Pressure Wire/FFR Study;  Surgeon: Sherren Mocha, MD;  Location: [place] CV LAB;  Service: Cardiovascular;  Laterality: N/A;   CATARACT EXTRACTION W/PHACO Left 11/10/2016   Procedure: CATARACT EXTRACTION PHACO AND INTRAOCULAR LENS PLACEMENT ([place]) suture placed in left eye at end of procedure;  Surgeon: Birder Robson, MD;  Location: [facility] ORS;  Service: Ophthalmology;  Laterality: Left;  [place]  3.20 AP% 27.7 CDE 55.63 Fluid pack lot # [id number]   CHOLECYSTECTOMY     COMBINED HYSTERECTOMY ABDOMINAL W/ A&P REPAIR / OOPHORECTOMY  1968   CORONARY ANGIOPLASTY     STENTS X 5   CORONARY STENT INTERVENTION N/A 09/19/2019   Procedure: CORONARY STENT INTERVENTION;  Surgeon: Nelva Bush, MD;  Location: [place] CV LAB;  Service: Cardiovascular;  Laterality: N/A;   CORONARY STENT PLACEMENT  2014   DILATION AND CURETTAGE OF UTERUS     GALLBLADDER SURGERY  1968   LEFT HEART CATH AND CORONARY ANGIOGRAPHY Left 09/19/2019   Procedure: LEFT HEART CATH AND CORONARY ANGIOGRAPHY;  Surgeon: Nelva Bush, MD;  Location: [place] CV LAB;  Service: Cardiovascular;  Laterality: Left;   LEFT HEART CATHETERIZATION WITH CORONARY ANGIOGRAM N/A 10/20/2012   Procedure: LEFT HEART CATHETERIZATION WITH CORONARY ANGIOGRAM;  Surgeon: Sherren Mocha, MD;  Location: [facility] CATH LAB;  Service: Cardiovascular;  Laterality: N/A;   multiple D&C     TONSILLECTOMY  age 89    Family History  Adopted: Yes  Problem Relation Age of Onset   Cancer Brother        Colon   Cancer Son    Coronary artery disease Neg Hx    Heart attack Neg Hx     Social History   Socioeconomic History   Marital status: Widowed    Spouse name: Not on file   Number of children: 3   Years of education: 92   Highest education level: Not on file  Occupational History   Occupation: CASHIER  Tobacco Use   Smoking status: Never    Passive exposure: Past   Smokeless tobacco: Never  Vaping Use   Vaping Use: Never used   Substance and Sexual Activity   Alcohol use: No   Drug use: No   Sexual activity: Never    Birth control/protection: Post-menopausal  Other Topics Concern   Not on file  Social History Narrative   Widowed '13. 2 sons- '63, '66; one daughter-'69;    72 grandchildren--has raised 9 of her grandchildren 4 great grandchildren.   Lives with 2 granddaughters and adoptive son      Has living will   Probably would have granddaughter York Cerise   Would accept resuscitation but no prolonged ventilation   Not sure about tube feeds   Social Determinants of  Health   Financial Resource Strain: Medium Risk   Difficulty of Paying Living Expenses: Somewhat hard  Food Insecurity: Not on file  Transportation Needs: No Transportation Needs   Lack of Transportation (Medical): No   Lack of Transportation (Non-Medical): No  Physical Activity: Not on file  Stress: Not on file  Social Connections: Not on file  Intimate Partner Violence: Not on file   Review of Systems Has had 2 falls--no injuries Not sleeping well---awakens every 2 hours    Objective:   Physical Exam Constitutional:      Appearance: Normal appearance.  Cardiovascular:     Rate and Rhythm: Normal rate and regular rhythm.     Heart sounds: No murmur heard.   No gallop.  Pulmonary:     Effort: Pulmonary effort is normal.     Breath sounds: Normal breath sounds. No wheezing or rales.  Musculoskeletal:     Cervical back: Neck supple.     Right lower leg: No edema.     Left lower leg: No edema.  Lymphadenopathy:     Cervical: No cervical adenopathy.  Skin:    Findings: No rash.  Neurological:     Mental Status: She is alert.           Assessment & Plan:

## 2021-07-14 NOTE — Assessment & Plan Note (Signed)
Discussed not eating late at night Will start protonix 40mg  daily on empty stomach

## 2021-07-14 NOTE — Assessment & Plan Note (Signed)
Increasing disability Maintains personal care with the norco 10/325 ---five daily

## 2021-07-14 NOTE — Assessment & Plan Note (Addendum)
Has only been able to take the metformin due to expense of lantus Lab Results  Component Value Date   HGBA1C 8.1 (A) 10/17/2020   Now it is 8.6% Now able to get lantus with assistance program Will resume 36 daily

## 2021-07-14 NOTE — Progress Notes (Deleted)
Cardiology Office Note:    Date:  07/14/2021   ID:  Cynthia Singh, DOB Sep 08, 1940, MRN [id number]  PCP:  Venia Carbon, MD  [facility] Cardiologist:  Nelva Bush, MD  [facility] Electrophysiologist:  None   Referring MD: Venia Carbon, MD   Chief Complaint: 6 month follow-up  History of Present Illness:    Cynthia Singh is a 81 y.o. female with a hx of with history of CAD s/p RCA stenting in 2014 and PCI/DES to the LAD with POBA to the D1 in 01/2015, poorly controlled DM, bilateral SDH in the setting of a mechanical fall in 2018 while on DAPT, HTN, HLD, dilated ascending aorta, and OA who presents for hospital follow-up as detailed below.   She was admitted in 01/2015 with unstable angina. Diagnostic LHC at that time showed 80% proximal LAD stenosis treated with PCI/DES, 95% ostial D1 lesion treated with PTCA with 40% residual stenosis, 60% mid LCx lesion, patent RCA stent, EF 55-65%. She was seen in hospital follow up in 03/2015, though was lost to follow up thereafter until she was admitted to [facility] in 07/2016 in the setting of a mechanical fall, suffering bilateral SDH while on DAPT. Plavix was discontinued with recommendation to resume ASA when safe. She has been lost to follow up since until her recent admission to [facility] on 4/19 through 4/21 with chest, posterior neck, and thoracic back pain that began at rest while sitting on her sofa.  Initial BP was in the 528U systolic.  Chest x-ray showed vascular congestion.  EKG showed sinus rhythm with LVH, poor R wave progression, and nonspecific ST-T changes.  CTA of the aortic showed no type A dissection with likely atherosclerotic disease with an ascending aorta measuring 3.7 cm.  High-sensitivity troponin peaked at 150.  She was placed on a heparin drip.  Initially, plans were for diagnostic LHC, however she declined this.  Echo showed an EF of 60 to 65%, normal RV systolic function and ventricular cavity size, trivial  mitral regurgitation.  She underwent Lexiscan MPI on 09/06/2019 which showed no significant ischemia and normal wall motion with an EF falsely reduced at 39% secondary to GI uptake artifact with EF noted to be normal by echo during the same admission.  Overall, this was a low risk scan.  The patient was hospitalized 09/2019 for back pain and elevated troponin thought to be NSTEMI. The patient declined cardiac cath and was medically managed. Subsequent myocardial perfusion stress test was low risk.  However, given continued symptoms after discharge, she was referred for coronary angiography and found to have moderate proximal and severe distal RCA disease as well as moderate to severe mid LCx stenosis.  She underwent stenting of the proximal and distal RCA with resolution of her symptoms.  The patient was last seen 09/29/19 and was feeling better. Meidcal management was pursued and amlodipine was added.   Today,        Past Medical History:  Diagnosis Date   CAD (coronary artery disease)    a. Remote nonobstructive disease but in 10/2012 Cath/PCI: s/p DES to RCA. b. cath 02/08/15 DES to prox LAD and balloon angioplasty of ost D1, EF 55-65%; c. 08/2019 PCI/DES prox/distal RCA (overlapping prev RCA stent). Prev placed RCA/LAD stents patent.   Concussion    SAH AFTER FALL 3/18   Depression    Diabetes mellitus, type 2 ([place])    Dyslipidemia    Episodic mood disorder ([place])  Hypertension    Myocardial infarction ([place])    2014   Obesity    Osteoarthritis of spine    knees also   Peripheral vascular disease, unspecified ([place]) 2020   severe disease on home screening by insurance    Past Surgical History:  Procedure Laterality Date   [place] N/A 02/08/2015   Procedure: Left Heart Cath and Coronary Angiography;  Surgeon: Sherren Mocha, MD;  Location: [place] CV LAB;  Service: Cardiovascular;  Laterality: N/A;   CARDIAC  CATHETERIZATION N/A 02/08/2015   Procedure: Coronary Stent Intervention;  Surgeon: Sherren Mocha, MD;  Location: [place] CV LAB;  Service: Cardiovascular;  Laterality: N/A;   CARDIAC CATHETERIZATION N/A 02/08/2015   Procedure: Intravascular Pressure Wire/FFR Study;  Surgeon: Sherren Mocha, MD;  Location: [place] [facility];  Service: Cardiovascular;  Laterality: N/A;   CATARACT EXTRACTION W/PHACO Left 11/10/2016   Procedure: CATARACT EXTRACTION PHACO AND INTRAOCULAR LENS PLACEMENT ([place]) suture placed in left eye at end of procedure;  Surgeon: Birder Robson, MD;  Location: [facility] ORS;  Service: Ophthalmology;  Laterality: Left;  [place]  3.20 AP% 27.7 CDE 55.63 Fluid pack lot # [id number]   CHOLECYSTECTOMY     COMBINED HYSTERECTOMY ABDOMINAL W/ A&P REPAIR / OOPHORECTOMY  1968   CORONARY ANGIOPLASTY     STENTS X 5   CORONARY STENT INTERVENTION N/A 09/19/2019   Procedure: CORONARY STENT INTERVENTION;  Surgeon: Nelva Bush, MD;  Location: [place] [facility];  Service: Cardiovascular;  Laterality: N/A;   CORONARY STENT PLACEMENT  2014   DILATION AND CURETTAGE OF UTERUS     GALLBLADDER SURGERY  1968   LEFT HEART CATH AND CORONARY ANGIOGRAPHY Left 09/19/2019   Procedure: LEFT HEART CATH AND CORONARY ANGIOGRAPHY;  Surgeon: Nelva Bush, MD;  Location: [place] [facility];  Service: Cardiovascular;  Laterality: Left;   LEFT HEART CATHETERIZATION WITH CORONARY ANGIOGRAM N/A 10/20/2012   Procedure: LEFT HEART CATHETERIZATION WITH CORONARY ANGIOGRAM;  Surgeon: Sherren Mocha, MD;  Location: [facility] CATH LAB;  Service: Cardiovascular;  Laterality: N/A;   multiple D&C     TONSILLECTOMY  age 47    Current Medications: No outpatient medications have been marked as taking for the 07/14/21 encounter (Appointment) with Kathlen Mody, Tony Friscia H, PA-C.     Allergies:   Patient has no known allergies.   Social History   Socioeconomic History   Marital status: Widowed    Spouse name: Not on file   Number of  children: 3   Years of education: 65   Highest education level: Not on file  Occupational History   Occupation: CASHIER  Tobacco Use   Smoking status: Never   Smokeless tobacco: Never  Vaping Use   Vaping Use: Never used  Substance and Sexual Activity   Alcohol use: No   Drug use: No   Sexual activity: Never    Birth control/protection: Post-menopausal  Other Topics Concern   Not on file  Social History Narrative   Widowed '13. 2 sons- '63, '66; one daughter-'69;    19 grandchildren--has raised 9 of her grandchildren 4 great grandchildren.   Lives with 2 granddaughters and adoptive son      Has living will   Probably would have granddaughter York Cerise   Would accept resuscitation but no prolonged ventilation   Not sure about tube feeds   Social Determinants of Health   Financial Resource Strain: Medium Risk   Difficulty of Paying Living  Expenses: Somewhat hard  Food Insecurity: Not on file  Transportation Needs: No Transportation Needs   Lack of Transportation (Medical): No   Lack of Transportation (Non-Medical): No  Physical Activity: Not on file  Stress: Not on file  Social Connections: Not on file     Family History: The patient's ***family history includes Cancer in her brother and son. There is no history of Coronary artery disease or Heart attack. She was adopted.  ROS:   Please see the history of present illness.    *** All other systems reviewed and are negative.  EKGs/Labs/Other Studies Reviewed:    The following studies were reviewed today: ***  EKG:  EKG is *** ordered today.  The ekg ordered today demonstrates ***  Recent Labs: 10/17/2020: ALT 17; BUN 14; Creatinine, Ser 0.86; Hemoglobin 13.7; Platelets 277.0; Potassium 4.3; Sodium 136  Recent Lipid Panel    Component Value Date/Time   CHOL 199 10/17/2020 1556   TRIG (H) 10/17/2020 1556    834.0 Triglyceride is over 400; calculations on Lipids are invalid.   HDL 36.30 (L) 10/17/2020 1556    CHOLHDL 5 10/17/2020 1556   VLDL 61 (H) 09/05/2019 0549   LDLCALC 62 09/05/2019 0549   LDLDIRECT 88.0 10/17/2020 1556     Risk Assessment/Calculations:   {Does this patient have ATRIAL FIBRILLATION?:[phone]}   Physical Exam:    VS:  There were no vitals taken for this visit.    Wt Readings from Last 3 Encounters:  01/15/21 197 lb (89.4 kg)  10/17/20 195 lb (88.5 kg)  07/11/20 191 lb 12 oz (87 kg)     GEN: *** Well nourished, well developed in no acute distress HEENT: Normal NECK: No JVD; No carotid bruits LYMPHATICS: No lymphadenopathy CARDIAC: ***RRR, no murmurs, rubs, gallops RESPIRATORY:  Clear to auscultation without rales, wheezing or rhonchi  ABDOMEN: Soft, non-tender, non-distended MUSCULOSKELETAL:  No edema; No deformity  SKIN: Warm and dry NEUROLOGIC:  Alert and oriented x 3 PSYCHIATRIC:  Normal affect   ASSESSMENT:    No diagnosis found. PLAN:    In order of problems listed above:  CAD  HLD  HTN  Disposition: Follow up {follow up:15908} with ***   Shared Decision Making/Informed Consent   {Are you ordering a CV Procedure (e.g. stress test, cath, DCCV, TEE, etc)?   Press F2        :[id number]}    Signed, Milad Bublitz Ninfa Meeker, PA-C  07/14/2021 11:02 AM    [place]

## 2021-07-14 NOTE — Assessment & Plan Note (Signed)
Occasional angina--nitro helps On atorvastatin 40mg , clopidogrel 75, carvedilol 25 bid, losartan 100 (HCTZ 12.5)

## 2021-07-15 ENCOUNTER — Encounter: Payer: Self-pay | Admitting: Medical

## 2021-07-15 DIAGNOSIS — E1159 Type 2 diabetes mellitus with other circulatory complications: Secondary | ICD-10-CM

## 2021-07-15 DIAGNOSIS — E1165 Type 2 diabetes mellitus with hyperglycemia: Secondary | ICD-10-CM

## 2021-07-15 DIAGNOSIS — E785 Hyperlipidemia, unspecified: Secondary | ICD-10-CM | POA: Diagnosis not present

## 2021-07-15 DIAGNOSIS — I10 Essential (primary) hypertension: Secondary | ICD-10-CM

## 2021-07-18 ENCOUNTER — Other Ambulatory Visit: Payer: Self-pay

## 2021-07-18 MED ORDER — OXYCODONE-ACETAMINOPHEN 10-325 MG PO TABS: 1.0000 | ORAL_TABLET | Freq: Four times a day (QID) | ORAL | 0 refills | Status: DC | PRN

## 2021-07-18 NOTE — Telephone Encounter (Signed)
MEDICATION: Oxycodone 10-325  ? ?[facility] [address] ? ?Comments: last fill 06/20/21 has follow up 10/15/2021 ? ?**Let patient know to contact pharmacy at the end of the day to make sure medication is ready. ** ? ?** Please notify patient to allow 48-72 hours to process** ? ?**Encourage patient to contact the pharmacy for refills or they can request refills through [facility]** ? ? ?

## 2021-07-18 NOTE — Telephone Encounter (Signed)
Name of Medication: Oxycodone ?Name of [facility]acy[facility] ?Last Fill or Written Date and Quantity: 06-20-21 #150 ?Last Office Visit and Type: 07-14-21 ?Next Office Visit and Type: 10-15-21 ?Last Controlled Substance Agreement Date: 10/17/20 ?Last UDS:10/17/20 ?

## 2021-07-22 NOTE — Telephone Encounter (Signed)
[facility] [address]  ?

## 2021-07-22 NOTE — Telephone Encounter (Addendum)
Pt called back and said to send to [place]. [facility] is due to [facility] being [facility]. ?

## 2021-07-22 NOTE — Telephone Encounter (Signed)
Pt called back [address] is out she will call back when she finds a Pharmacy that has it in stock ?

## 2021-07-22 NOTE — Addendum Note (Signed)
Addended by: Pilar Grammes on: 07/22/2021 10:01 AM ? ? Modules accepted: Orders ? ?

## 2021-07-22 NOTE — Addendum Note (Signed)
Addended by: Pilar Grammes on: 07/22/2021 04:38 PM ? ? Modules accepted: Orders ? ?

## 2021-07-22 NOTE — Addendum Note (Signed)
Addended by: Emelia Salisbury C on: 07/22/2021 02:44 PM ? ? Modules accepted: Orders ? ?

## 2021-07-24 NOTE — Telephone Encounter (Signed)
Patient called back will be out today needs refill sent in to [address]  ?

## 2021-07-25 MED ORDER — OXYCODONE-ACETAMINOPHEN 10-325 MG PO TABS: 1.0000 | ORAL_TABLET | Freq: Four times a day (QID) | ORAL | 0 refills | Status: DC | PRN

## 2021-07-25 NOTE — Telephone Encounter (Signed)
Patient

## 2021-07-25 NOTE — Telephone Encounter (Signed)
Spoke to pt to let her know it had been sent to [facility] for her. ?

## 2021-07-29 ENCOUNTER — Telehealth: Payer: Self-pay

## 2021-07-29 NOTE — Progress Notes (Signed)
? ? ?  Chronic Care Management ?Pharmacy Assistant  ? ?Name: Cynthia Singh  MRN: [id number] DOB: [birth year] ? ?Reason for Encounter: CCM Counsellor) ?  ?Medications: ?Outpatient Encounter Medications as of 07/29/2021  ?Medication Sig  ? amLODipine (NORVASC) 2.5 MG tablet TAKE 1 TABLET BY MOUTH DAILY PT NEEDS TO MAKE APPT WITH PROVIDER TO GET FURTHER REFILLS- 1ST ATTEMPT  ? aspirin EC 81 MG tablet Take 81 mg by mouth daily.  ? atorvastatin (LIPITOR) 40 MG tablet Take 1 tablet (40 mg total) by mouth daily.  ? clopidogrel (PLAVIX) 75 MG tablet TAKE 1 TABLET BY MOUTH EVERY DAY  ? Cranberry 500 MG CAPS Take 1,000 mg by mouth daily.  ? DULoxetine (CYMBALTA) 30 MG capsule TAKE 1 CAPSULE BY MOUTH EVERY DAY  ? Insulin Pen Needle (PEN NEEDLES) 32G X 5 MM MISC 1 Units by Does not apply route daily.  ? LANTUS SOLOSTAR 100 UNIT/ML Solostar Pen TAKE 36 UNITS DAILY UNDER SKIN (Patient not taking: Reported on 06/24/2021)  ? Menthol, Topical Analgesic, (ASPERCREME MAX ROLL-ON EX) Apply topically.  ? metFORMIN (GLUCOPHAGE-XR) 500 MG 24 hr tablet TAKE 2 TABLETS (1,000 MG TOTAL) BY MOUTH IN THE MORNING AND AT BEDTIME.  ? nitroGLYCERIN (NITROSTAT) 0.4 MG SL tablet Place 1 tablet (0.4 mg total) under the tongue every 5 (five) minutes as needed for chest pain. (Patient taking differently: Place 0.4 mg under the tongue daily. For arm/back pain)  ? nortriptyline (PAMELOR) 50 MG capsule TAKE 3 CAPSULES (150 MG TOTAL) BY MOUTH AT BEDTIME.  ? Omega-3 Fatty Acids (FISH OIL PO) Take 1 capsule by mouth daily.  ? oxyCODONE-acetaminophen (PERCOCET) 10-325 MG tablet Take 1-2 tablets by mouth every 6 (six) hours as needed for pain. M54.9  ? pantoprazole (PROTONIX) 40 MG tablet Take 1 tablet (40 mg total) by mouth daily.  ? polyethylene glycol (MIRALAX / GLYCOLAX) packet Take 17 g by mouth daily as needed.   ? pyridOXINE (VITAMIN B-6) 100 MG tablet Take 100 mg by mouth daily.  ? ?No facility-administered encounter medications on file as of  07/29/2021.  ? ?Cynthia Singh was contacted to remind of upcoming telephone visit with Charlene Brooke on 08/01/2021 at 1:00 pm. Patient was reminded to have any blood glucose and blood pressure readings available for review at appointment. If unable to reach, a voicemail was left for patient.  ? ?Are you having any problems with your medications? No  ? ?Do you have any concerns you like to discuss with the pharmacist? No ? ?Star Rating Drugs: ?Medication:  Last Fill: Day Supply ?Atorvastatin 40 mg 06/30/2021 90 ?Metformin 500 mg 05/01/2021 90  ?[facility] ? ?Charlene Brooke, CPP notified ? ?[name], RMA ?Clinical Pharmacy Assistant ?[phone] ? ? ? ? ? ?

## 2021-08-01 ENCOUNTER — Ambulatory Visit (INDEPENDENT_AMBULATORY_CARE_PROVIDER_SITE_OTHER): Payer: Medicare Other | Admitting: Pharmacist

## 2021-08-01 ENCOUNTER — Other Ambulatory Visit: Payer: Self-pay

## 2021-08-01 DIAGNOSIS — E785 Hyperlipidemia, unspecified: Secondary | ICD-10-CM

## 2021-08-01 DIAGNOSIS — E1165 Type 2 diabetes mellitus with hyperglycemia: Secondary | ICD-10-CM

## 2021-08-01 DIAGNOSIS — I10 Essential (primary) hypertension: Secondary | ICD-10-CM

## 2021-08-01 DIAGNOSIS — F39 Unspecified mood [affective] disorder: Secondary | ICD-10-CM

## 2021-08-01 DIAGNOSIS — I25119 Atherosclerotic heart disease of native coronary artery with unspecified angina pectoris: Secondary | ICD-10-CM

## 2021-08-01 DIAGNOSIS — E1159 Type 2 diabetes mellitus with other circulatory complications: Secondary | ICD-10-CM

## 2021-08-01 NOTE — Patient Instructions (Signed)
Visit Information ? ?Phone number for Pharmacist: [phone] ? ? Goals Addressed   ? ?  ?  ?  ?  ? This Visit's Progress  ?  Monitor and Manage My Blood Sugar-Diabetes Type 2     ?  Timeframe:  Long-Range Goal ?Priority:  Medium ?Start Date:     08/01/21                        ?Expected End Date:    08/02/22                  ? ?Follow Up Date April 2023 ?  ?- check blood sugar at prescribed times ?- check blood sugar if I feel it is too high or too low ?- enter blood sugar readings and medication or insulin into daily log ?- take the blood sugar log to all doctor visits  ?  ?Why is this important?   ?Checking your blood sugar at home helps to keep it from getting very high or very low.  ?Writing the results in a diary or log helps the doctor know how to care for you.  ?Your blood sugar log should have the time, date and the results.  ?Also, write down the amount of insulin or other medicine that you take.  ?Other information, like what you ate, exercise done and how you were feeling, will also be helpful.   ?  ?Notes:  ?  ? ?  ? ? ?Care Plan : [place]  ?Updates made by Charlton Haws, RPH since 08/01/2021 12:00 AM  ?  ? ?Problem: Hypertension, Hyperlipidemia, Diabetes, Coronary Artery Disease, and Depression   ?Priority: High  ?  ? ?Long-Range Goal: Disease mgmt   ?Start Date: 06/24/2021  ?Expected End Date: 08/02/2022  ?This Visit's Progress: On track  ?Priority: High  ?Note:   ?Current Barriers:  ?Unable to independently afford treatment regimen ?Unable to independently monitor therapeutic efficacy ? ?Pharmacist Clinical Goal(s):  ?Patient will verbalize ability to afford treatment regimen ?achieve adherence to monitoring guidelines and medication adherence to achieve therapeutic efficacy through collaboration with PharmD and provider.  ? ?Current Barriers:  ?Unable to afford medications ?Not taking medications as prescribed  ?  ?Pharmacist Clinical Goal(s):  ?Patient will contact provider office  for questions/concerns as evidenced notation of same in electronic health record through collaboration with PharmD and provider.  ?  ?Interventions: ?1:1 collaboration with Venia Carbon, MD regarding development and update of comprehensive plan of care as evidenced by provider attestation and co-signature ?Inter-disciplinary care team collaboration (see longitudinal plan of care) ?Comprehensive medication review performed; medication list updated in electronic medical record ?  ?Hypertension (BP goal <140/90) ?-Controlled - pt stopped Losartan-HCTZ and carvedilol on her own over a year ago; BP is at goal in recent office visit on amlodipine monotherapy ?-Current treatment: ?Amlodipine 2.5 mg daily- Appropriate, Effective, Safe, Accessible ?-Medications previously tried: losartan, hctz, carvedilol  ?-Recommend to continue current medication ?  ?Diabetes (A1c goal <8%) ?-Uncontrolled - A1c 8.1% (10/2020); pt is not taking Lantus due to cost issues; she was just approved for [employer] PAP and office received shipment today ?-Current home glucose readings ?fasting glucose: 231 ?-Current medications: ?Metformin 500 mg ER 2 tab BID - Appropriate, Query Effective ?Lantus 36 units daily (PAP)- not started yet ?-Medications previously tried: [place] (cost) ?-Educated on A1c and blood sugar goals; Benefits of routine self-monitoring of blood sugar; ?-Advised to drink several glasses of  water if BG is every > 400 and contact PCP ?-Recommended to continue current medication ? ?Hyperlipidemia / CAD (LDL goal < 70) ?-Not ideally controlled - LDL 88 (10/2020), pt was not on statin at the time (last fill prior to that 02/2020); pt has restarted atorvastatin as of 06/2021 and denies issues ?-Hx CAD s/p stent 2014, 2016; hx PAD w/o claudication ?-Current treatment: ?Atorvastatin 40 mg daily - Appropriate, Query Effective ?Clopidogrel 75 mg daily - Appropriate, Effective, Safe, Accessible ?-Medications previously tried: none  reported  ?-Educated on Benefits of statin for ASCVD risk reduction; ?-Recommend to continue current medication; repeat lipid panel at CPE ?  ?Depression/Anxiety (Goal: Improve mood) ?-Controlled - per pt report ?-Current treatment: ?Duloxetine 30 mg daily- Appropriate, Effective, Safe, Accessible ?Nortriptyline 50 mg - 3 cap HS - Appropriate, Effective, Query Safe ? -Recommended to continue current medication  ?  ?Patient Goals/Self-Care Activities ?Patient will:  ?- take medications as prescribed as evidenced by patient report and record review ?focus on medication adherence by routine ?check glucose daily, document, and provide at future appointments ?collaborate with provider on medication access solutions (Lantus) ?  ?  ? ?The patient verbalized understanding of instructions, educational materials, and care plan provided today and declined offer to receive copy of patient instructions, educational materials, and care plan.  ?Telephone follow up appointment with pharmacy team member scheduled for: 1 month ? ?Charlene Brooke, PharmD, BCACP ?Clinical Pharmacist ?[facility] ?[phone] ?  ?

## 2021-08-01 NOTE — Progress Notes (Signed)
Chronic Care Management Pharmacy Note 06/24/2021 Name:  Cynthia Singh MRN:  [id number] DOB:  1941/01/11  Summary: CCM F/U visit -Pt endorses compliance with medications on current list -Pt has not started Lantus yet, PAP was just approved and office received shipment today. Fasting BG in 200s per patient. -Pt endorses compliance with atorvastatin - just restarted 06/2021. Most recent lipid panel from 10/2020 (LDL 88) pt was not taking statin  Recommendations/Changes made from today's visit: -Advised to start Lantus 36 units as prescribed -Recommend repeat lipid panel with next CPE  Plan: -Pharmacist follow up televisit scheduled for 1 month -PCP f/u 10/15/21   Subjective: Cynthia Singh is an 81 y.o. year old female who is a primary patient of Karie Schwalbe, MD.  The CCM team was consulted for assistance with disease management and care coordination needs.    Engaged with patient by telephone for follow up visit in response to provider referral for pharmacy case management and/or care coordination services.   Consent to Services:  The patient was given information about Chronic Care Management services, agreed to services, and gave verbal consent prior to initiation of services.  Please see initial visit note for detailed documentation.   Patient Care Team: Karie Schwalbe, MD as PCP - General (Internal Medicine) End, Cristal Deer, MD as PCP - Cardiology (Cardiology) Kathyrn Sheriff, [facility] as Pharmacist (Pharmacist)  Recent office visits: 07/14/21 Dr Alphonsus Sias OV: f/u DM. A1c 8.6%. Resume Lantus 36 units (PAP). Rx Pantoprazole for GERD. BP 132/84 on amlodipine only. RTC 3 months.  01/15/21 - Tillman Abide, MD - Pt presented for annual exam. Continue current medications. Follow up 3 months.  Recent consult visits: None in previous 6 months  Hospital visits: None in previous 6 months  Objective:  Lab Results  Component Value Date   CREATININE 0.86 10/17/2020   BUN  14 10/17/2020   GFR 65.15 10/17/2020   GFRNONAA >60 09/20/2019   GFRAA >60 09/20/2019   NA 136 10/17/2020   K 4.3 10/17/2020   CALCIUM 9.3 10/17/2020   CO2 25 10/17/2020   GLUCOSE 217 (H) 10/17/2020    Lab Results  Component Value Date/Time   HGBA1C 8.6 (A) 07/14/2021 02:32 PM   HGBA1C 8.1 (A) 10/17/2020 03:11 PM   HGBA1C 13.8 (H) 05/24/2019 12:01 PM   HGBA1C 11.6 (H) 10/19/2017 10:51 AM   GFR 65.15 10/17/2020 03:56 PM   GFR 67.77 05/24/2019 12:01 PM    Last diabetic Eye exam:  Lab Results  Component Value Date/Time   HMDIABEYEEXA No Retinopathy 01/15/2021 12:00 AM    Last diabetic Foot exam:  Lab Results  Component Value Date/Time   HMDIABFOOTEX done 05/24/2019 12:00 AM     Lab Results  Component Value Date   CHOL 199 10/17/2020   HDL 36.30 (L) 10/17/2020   LDLCALC 62 09/05/2019   LDLDIRECT 88.0 10/17/2020   TRIG (H) 10/17/2020    834.0 Triglyceride is over 400; calculations on Lipids are invalid.   CHOLHDL 5 10/17/2020    Hepatic Function Latest Ref Rng & Units 10/17/2020 09/04/2019 05/24/2019  Total Protein 6.0 - 8.3 g/dL 6.9 4.0(J) 7.6  Albumin 3.5 - 5.2 g/dL 4.3 8.1(X) 4.4  AST 0 - 37 U/L 18 20 11   ALT 0 - 35 U/L 17 14 13   Alk Phosphatase 39 - 117 U/L 79 74 86  Total Bilirubin 0.2 - 1.2 mg/dL 0.2 0.8 0.5  Bilirubin, Direct 0.0 - 0.2 mg/dL - 0.2 -    Lab  Results  Component Value Date/Time   TSH 1.572 10/19/2012 07:44 PM   TSH 1.75 10/05/2011 01:48 PM   FREET4 0.87 10/17/2020 03:56 PM   FREET4 0.95 04/06/2018 09:53 AM    CBC Latest Ref Rng & Units 10/17/2020 09/20/2019 09/06/2019  WBC 4.0 - 10.5 K/uL 10.1 10.2 12.5(H)  Hemoglobin 12.0 - 15.0 g/dL 16.1 09.6 04.5  Hematocrit 36.0 - 46.0 % 39.5 39.3 41.1  Platelets 150.0 - 400.0 K/uL 277.0 226 224    No results found for: VD25OH  Clinical ASCVD: Yes  The ASCVD Risk score (Arnett DK, et al., 2019) failed to calculate for the following reasons:   The patient has a prior MI or stroke diagnosis    Depression  screen [facility] 2/9 01/15/2021 10/29/2016 04/30/2014  Decreased Interest 0 0 0  Down, Depressed, Hopeless 0 1 0  PHQ - 2 Score 0 1 0  Some recent data might be hidden     Social History   Tobacco Use  Smoking Status Never   Passive exposure: Past  Smokeless Tobacco Never   BP Readings from Last 3 Encounters:  07/14/21 132/84  01/15/21 136/84  10/17/20 118/74   Pulse Readings from Last 3 Encounters:  07/14/21 88  01/15/21 94  10/17/20 78   Wt Readings from Last 3 Encounters:  07/14/21 189 lb (85.7 kg)  01/15/21 197 lb (89.4 kg)  10/17/20 195 lb (88.5 kg)   BMI Readings from Last 3 Encounters:  07/14/21 28.74 kg/m  01/15/21 29.95 kg/m  10/17/20 29.65 kg/m    Assessment/Interventions: Review of patient past medical history, allergies, medications, health status, including review of consultants reports, laboratory and other test data, was performed as part of comprehensive evaluation and provision of chronic care management services.   SDOH:  (Social Determinants of Health) assessments and interventions performed: Yes   SDOH Screenings   Alcohol Screen: Not on file  Depression (PHQ2-9): Low Risk    PHQ-2 Score: 0  Financial Resource Strain: Medium Risk   Difficulty of Paying Living Expenses: Somewhat hard  Food Insecurity: Not on file  Housing: Not on file  Physical Activity: Not on file  Social Connections: Not on file  Stress: Not on file  Tobacco Use: Low Risk    Smoking Tobacco Use: Never   Smokeless Tobacco Use: Never   Passive Exposure: Past  Transportation Needs: No Transportation Needs   Lack of Transportation (Medical): No   Lack of Transportation (Non-Medical): No    CCM Care Plan  No Known Allergies  Medications Reviewed Today     Reviewed by Kathyrn Sheriff, [facility] (Pharmacist) on 08/01/21 at 1333  Med List Status: <None>   Medication Order Taking? Sig Documenting Provider Last Dose Status Informant  amLODipine (NORVASC) 2.5 MG tablet  [id number] Yes TAKE 1 TABLET BY MOUTH DAILY PT NEEDS TO MAKE APPT WITH PROVIDER TO GET FURTHER REFILLS- 1ST ATTEMPT End, Cristal Deer, MD Taking Active   aspirin EC 81 MG tablet [id number] Yes Take 81 mg by mouth daily. [provider] Taking Active Self  atorvastatin (LIPITOR) 40 MG tablet [id number] Yes Take 1 tablet (40 mg total) by mouth daily. Karie Schwalbe, MD Taking Active   clopidogrel (PLAVIX) 75 MG tablet [id number] Yes TAKE 1 TABLET BY MOUTH EVERY DAY Karie Schwalbe, MD Taking Active   Cranberry 500 MG CAPS [id number] Yes Take 1,000 mg by mouth daily. [provider] Taking Active Self  DULoxetine (CYMBALTA) 30 MG capsule [id number] Yes TAKE 1 CAPSULE BY MOUTH  EVERY DAY Karie Schwalbe, MD Taking Active   Insulin Pen Needle (PEN NEEDLES) 32G X 5 MM MISC [id number] Yes 1 Units by Does not apply route daily. Karie Schwalbe, MD Taking Active Self  LANTUS SOLOSTAR 100 UNIT/ML Solostar Pen [id number] Yes TAKE 36 UNITS DAILY UNDER SKIN Karie Schwalbe, MD Taking Active   Menthol, Topical Analgesic, (ASPERCREME MAX ROLL-ON EX) [id number] Yes Apply topically. [provider] Taking Active   metFORMIN (GLUCOPHAGE-XR) 500 MG 24 hr tablet [id number] Yes TAKE 2 TABLETS (1,000 MG TOTAL) BY MOUTH IN THE MORNING AND AT BEDTIME. Karie Schwalbe, MD Taking Active   nitroGLYCERIN (NITROSTAT) 0.4 MG SL tablet [id number] Yes Place 1 tablet (0.4 mg total) under the tongue every 5 (five) minutes as needed for chest pain.  Patient taking differently: Place 0.4 mg under the tongue daily. For arm/back pain   Karie Schwalbe, MD Taking Active Self  nortriptyline (PAMELOR) 50 MG capsule [id number] Yes TAKE 3 CAPSULES (150 MG TOTAL) BY MOUTH AT BEDTIME. Karie Schwalbe, MD Taking Active   Omega-3 Fatty Acids (FISH OIL PO) [id number] Yes Take 1 capsule by mouth daily. [provider] Taking Active Self  oxyCODONE-acetaminophen (PERCOCET) 10-325 MG tablet [id number] Yes Take 1-2  tablets by mouth every 6 (six) hours as needed for pain. M54.9 Worthy Rancher B, FNP Taking Active   pantoprazole (PROTONIX) 40 MG tablet [id number] Yes Take 1 tablet (40 mg total) by mouth daily. Karie Schwalbe, MD Taking Active   polyethylene glycol [facility] / GLYCOLAX) packet [id number] Yes Take 17 g by mouth daily as needed.  [provider] Taking Active Self  pyridOXINE (VITAMIN B-6) 100 MG tablet [id number] Yes Take 100 mg by mouth daily. [provider] Taking Active             Patient Active Problem List   Diagnosis Date Noted   GERD (gastroesophageal reflux disease) 07/14/2021   Vaginal discomfort 06/14/2019   Peripheral vascular disease, unspecified (HCC) 2020   Narcotic dependence (HCC) 10/29/2016   CAD S/P percutaneous coronary angioplasty/DES (RCA-2014/LAD-2016) 07/19/2016   Preventative health care 04/30/2014   Advanced directives, counseling/discussion 04/30/2014   Atherosclerotic heart disease of native coronary artery with angina pectoris (HCC) 10/21/2012   Chest pain 10/19/2012   KNEE PAIN, LEFT, CHRONIC 02/25/2010   Poorly controlled type 2 diabetes mellitus with circulatory disorder (HCC) 12/30/2009   Hyperlipidemia LDL goal <70 12/30/2009   Episodic mood disorder (HCC) 12/30/2009   Essential hypertension 12/30/2009   Chronic back pain 12/30/2009    Immunization History  Administered Date(s) Administered   Fluad Quad(high Dose 65+) 03/21/2020, 01/15/2021   Influenza Split 02/15/2012   Influenza Whole 03/11/2009   Influenza, Seasonal, Injecte, Preservative Fre 03/25/2016   Influenza,inj,Quad PF,6+ Mos 03/20/2014, 02/09/2015, 02/23/2017, 01/21/2018, 05/24/2019   Moderna Sars-Covid-2 Vaccination 11/08/2019, 12/13/2019, 08/05/2020   Pneumococcal Conjugate-13 05/30/2013   Pneumococcal Polysaccharide-23 05/18/2009   Td 07/16/2008   Zoster Recombinat (Shingrix) 09/15/2018    Conditions to be addressed/monitored:  Hypertension,  Hyperlipidemia, Diabetes, Coronary Artery Disease, and Depression  Care Plan : CCM Pharmacy Care Plan  Updates made by Kathyrn Sheriff, RPH since 08/01/2021 12:00 AM     Problem: Hypertension, Hyperlipidemia, Diabetes, Coronary Artery Disease, and Depression   Priority: High     Long-Range Goal: Disease mgmt   Start Date: 06/24/2021  Expected End Date: 08/02/2022  This Visit's Progress: On track  Priority: High  Note:   Current Barriers:  Unable to independently afford treatment  regimen Unable to independently monitor therapeutic efficacy  Pharmacist Clinical Goal(s):  Patient will verbalize ability to afford treatment regimen achieve adherence to monitoring guidelines and medication adherence to achieve therapeutic efficacy through collaboration with PharmD and provider.   Current Barriers:  Unable to afford medications Not taking medications as prescribed    Pharmacist Clinical Goal(s):  Patient will contact provider office for questions/concerns as evidenced notation of same in electronic health record through collaboration with PharmD and provider.    Interventions: 1:1 collaboration with Karie Schwalbe, MD regarding development and update of comprehensive plan of care as evidenced by provider attestation and co-signature Inter-disciplinary care team collaboration (see longitudinal plan of care) Comprehensive medication review performed; medication list updated in electronic medical record   Hypertension (BP goal <140/90) -Controlled - pt stopped Losartan-HCTZ and carvedilol on her own over a year ago; BP is at goal in recent office visit on amlodipine monotherapy -Current treatment: Amlodipine 2.5 mg daily- Appropriate, Effective, Safe, Accessible -Medications previously tried: losartan, hctz, carvedilol  -Recommend to continue current medication   Diabetes (A1c goal <8%) -Uncontrolled - A1c 8.1% (10/2020); pt is not taking Lantus due to cost issues; she was just  approved for [employer] PAP and office received shipment today -Current home glucose readings fasting glucose: 231 -Current medications: Metformin 500 mg ER 2 tab BID - Appropriate, Query Effective Lantus 36 units daily (PAP)- not started yet -Medications previously tried: [place] (cost) -Educated on A1c and blood sugar goals; Benefits of routine self-monitoring of blood sugar; -Advised to drink several glasses of water if BG is every > 400 and contact PCP -Recommended to continue current medication  Hyperlipidemia / CAD (LDL goal < 70) -Not ideally controlled - LDL 88 (10/2020), pt was not on statin at the time (last fill prior to that 02/2020); pt has restarted atorvastatin as of 06/2021 and denies issues -Hx CAD s/p stent 2014, 2016; hx PAD w/o claudication -Current treatment: Atorvastatin 40 mg daily - Appropriate, Query Effective Clopidogrel 75 mg daily - Appropriate, Effective, Safe, Accessible -Medications previously tried: none reported  -Educated on Benefits of statin for ASCVD risk reduction; -Recommend to continue current medication; repeat lipid panel at CPE   Depression/Anxiety (Goal: Improve mood) -Controlled - per pt report -Current treatment: Duloxetine 30 mg daily- Appropriate, Effective, Safe, Accessible Nortriptyline 50 mg - 3 cap HS - Appropriate, Effective, Query Safe  -Recommended to continue current medication    Patient Goals/Self-Care Activities Patient will:  - take medications as prescribed as evidenced by patient report and record review focus on medication adherence by routine check glucose daily, document, and provide at future appointments collaborate with provider on medication access solutions (Lantus)     Medication Assistance:  Lantus - Sanofi approved for 2023.  Compliance/Adherence/Medication fill history: Care Gaps: Foot exam due DEXA Scan due  Star-Rating Drugs: Metformin 500 mg - PDC 100% Atorvastatin 80 mg - PDC 16%; LF 06/30/21 X 90  DS   Patient's preferred pharmacy is:  [facility] [address], [place] - [address] [place] [address] Phone: [phone] Fax: [phone]  Uses pill box? No - she has one but has not filled it recently Pt endorses 100% compliance   Care Plan and Follow Up Patient Decision:  Patient agrees to Care Plan and Follow-up.  Follow Up Plan: Telephone follow up appointment with care management team member scheduled for: 1 month  Al Corpus, PharmD, BCACP Clinical Pharmacist [facility] [phone]

## 2021-08-04 ENCOUNTER — Telehealth: Payer: Self-pay | Admitting: Pharmacist

## 2021-08-04 NOTE — Telephone Encounter (Signed)
Yes, I think Friday. But, I was working in the lab and did not have time to call her. ?

## 2021-08-04 NOTE — Telephone Encounter (Signed)
Spoke with [employer] and they report Lantus assistance was approved and the medication was delivered to [facility] office 08/01/21. ?

## 2021-08-12 ENCOUNTER — Telehealth: Payer: Self-pay | Admitting: Internal Medicine

## 2021-08-12 NOTE — Telephone Encounter (Signed)
Mr Selinda Flavin schuler picked up Shalaina Panagopoulos medication @ 2:00 pm on 3.28.23 from [facility]ins ?

## 2021-08-12 NOTE — Progress Notes (Signed)
Called patient to inform her Lantus is available for pick up at the office. Patient understood and will pick up.  ? ?Charlene Brooke, CPP notified ? ?[name], RMA ?Clinical Pharmacy Assistant ?[phone] ? ? ?

## 2021-08-12 NOTE — Telephone Encounter (Signed)
Patient called, she is sending Rock Nephew over to pick up her Lantas ?

## 2021-08-15 DIAGNOSIS — I25119 Atherosclerotic heart disease of native coronary artery with unspecified angina pectoris: Secondary | ICD-10-CM

## 2021-08-15 DIAGNOSIS — E1165 Type 2 diabetes mellitus with hyperglycemia: Secondary | ICD-10-CM | POA: Diagnosis not present

## 2021-08-15 DIAGNOSIS — E1159 Type 2 diabetes mellitus with other circulatory complications: Secondary | ICD-10-CM

## 2021-08-15 DIAGNOSIS — E785 Hyperlipidemia, unspecified: Secondary | ICD-10-CM | POA: Diagnosis not present

## 2021-08-15 DIAGNOSIS — I10 Essential (primary) hypertension: Secondary | ICD-10-CM

## 2021-08-20 ENCOUNTER — Other Ambulatory Visit: Payer: Self-pay | Admitting: Internal Medicine

## 2021-08-20 MED ORDER — OXYCODONE-ACETAMINOPHEN 10-325 MG PO TABS: 1.0000 | ORAL_TABLET | Freq: Four times a day (QID) | ORAL | 0 refills | Status: DC | PRN

## 2021-08-20 NOTE — Telephone Encounter (Signed)
Name of Medication: Oxycodone ?Name of Pharmacy: [facility] ?Last Fill or Written Date and Quantity: 07-25-21 #150 ?Last Office Visit and Type: 07-14-21 ?Next Office Visit and Type: 10-15-21 ?Last Controlled Substance Agreement Date: 10/17/20 ?Last UDS:10/17/20 ?

## 2021-08-20 NOTE — Telephone Encounter (Signed)
?  Encourage patient to contact the pharmacy for refills or they can request refills through [facility] ? ?LAST APPOINTMENT DATE:  Please schedule appointment if longer than 1 year ? ?NEXT APPOINTMENT DATE: ? ?MEDICATION:oxyCODONE-acetaminophen (PERCOCET) 10-325 MG tablet ? ?Is the patient out of medication?  ? ?[id number] [place] [address] ?Let patient know to contact pharmacy at the end of the day to make sure medication is ready. ? ?Please notify patient to allow 48-72 hours to process ? ?CLINICAL FILLS OUT ALL BELOW:  ? ?LAST REFILL: ? ?QTY: ? ?REFILL DATE: ? ? ? ?OTHER COMMENTS:  ? ? ?Okay for refill? ? ?Please advise ? ? ?  ?

## 2021-08-28 ENCOUNTER — Telehealth: Payer: Self-pay

## 2021-08-28 NOTE — Progress Notes (Signed)
? ? ?  Chronic Care Management ?Pharmacy Assistant  ? ?Name: SILVINA HACKLEMAN  MRN: [id number] DOB: 1940/08/30 ? ?Reason for Encounter: CCM Counsellor) ? ?Medications: ?Outpatient Encounter Medications as of 08/28/2021  ?Medication Sig  ? amLODipine (NORVASC) 2.5 MG tablet TAKE 1 TABLET BY MOUTH DAILY PT NEEDS TO MAKE APPT WITH PROVIDER TO GET FURTHER REFILLS- 1ST ATTEMPT  ? aspirin EC 81 MG tablet Take 81 mg by mouth daily.  ? atorvastatin (LIPITOR) 40 MG tablet Take 1 tablet (40 mg total) by mouth daily.  ? clopidogrel (PLAVIX) 75 MG tablet TAKE 1 TABLET BY MOUTH EVERY DAY  ? Cranberry 500 MG CAPS Take 1,000 mg by mouth daily.  ? DULoxetine (CYMBALTA) 30 MG capsule TAKE 1 CAPSULE BY MOUTH EVERY DAY  ? Insulin Pen Needle (PEN NEEDLES) 32G X 5 MM MISC 1 Units by Does not apply route daily.  ? LANTUS SOLOSTAR 100 UNIT/ML Solostar Pen TAKE 36 UNITS DAILY UNDER SKIN  ? Menthol, Topical Analgesic, (ASPERCREME MAX ROLL-ON EX) Apply topically.  ? metFORMIN (GLUCOPHAGE-XR) 500 MG 24 hr tablet TAKE 2 TABLETS (1,000 MG TOTAL) BY MOUTH IN THE MORNING AND AT BEDTIME.  ? nitroGLYCERIN (NITROSTAT) 0.4 MG SL tablet Place 1 tablet (0.4 mg total) under the tongue every 5 (five) minutes as needed for chest pain. (Patient taking differently: Place 0.4 mg under the tongue daily. For arm/back pain)  ? nortriptyline (PAMELOR) 50 MG capsule TAKE 3 CAPSULES (150 MG TOTAL) BY MOUTH AT BEDTIME.  ? Omega-3 Fatty Acids (FISH OIL PO) Take 1 capsule by mouth daily.  ? oxyCODONE-acetaminophen (PERCOCET) 10-325 MG tablet Take 1-2 tablets by mouth every 6 (six) hours as needed for pain. M54.9  ? pantoprazole (PROTONIX) 40 MG tablet Take 1 tablet (40 mg total) by mouth daily.  ? polyethylene glycol (MIRALAX / GLYCOLAX) packet Take 17 g by mouth daily as needed.   ? pyridOXINE (VITAMIN B-6) 100 MG tablet Take 100 mg by mouth daily.  ? ?No facility-administered encounter medications on file as of 08/28/2021.  ? ?OLUWADARASIMI FAVOR was contacted  to remind of upcoming telephone visit with Charlene Brooke on 09/02/2021 at 11:45. Patient was reminded to have any blood glucose and blood pressure readings available for review at appointment.  ? ?Patient confirmed appointment. ? ?Are you having any problems with your medications? No  ? ?Do you have any concerns you like to discuss with the pharmacist? No ? ?Star Rating Drugs: ?Medication:  Last Fill: Day Supply ?Atorvastatin 40 mg 06/30/2021 90 ?Metformin 500 mg 08/20/2021 90  ? ?Charlene Brooke, CPP notified ? ?[name], RMA ?Clinical Pharmacy Assistant ?[phone] ? ?  ? ? ?

## 2021-09-02 ENCOUNTER — Ambulatory Visit (INDEPENDENT_AMBULATORY_CARE_PROVIDER_SITE_OTHER): Payer: Medicare Other | Admitting: Pharmacist

## 2021-09-02 DIAGNOSIS — E785 Hyperlipidemia, unspecified: Secondary | ICD-10-CM

## 2021-09-02 DIAGNOSIS — I25119 Atherosclerotic heart disease of native coronary artery with unspecified angina pectoris: Secondary | ICD-10-CM

## 2021-09-02 DIAGNOSIS — E1165 Type 2 diabetes mellitus with hyperglycemia: Secondary | ICD-10-CM

## 2021-09-02 DIAGNOSIS — E1159 Type 2 diabetes mellitus with other circulatory complications: Secondary | ICD-10-CM

## 2021-09-02 DIAGNOSIS — I10 Essential (primary) hypertension: Secondary | ICD-10-CM

## 2021-09-02 NOTE — Progress Notes (Signed)
Chronic Care Management Pharmacy Note 06/24/2021 Name:  Cynthia Singh MRN:  [id number] DOB:  Nov 03, 1940  Summary: CCM F/U visit -Pt endorses compliance with medications on current list -Pt received Lantus through [employer]; she does not have BG readings available during call but reports they are "better" since starting insulin; she denies s/sx of hypoglycemia -Pt endorses compliance with atorvastatin - just restarted 06/2021. Most recent lipid panel from 10/2020 (LDL 88) pt was not taking statin  Recommendations/Changes made from today's visit: -Keep BG log and provide at future visits -Recommend repeat lipid panel with next CPE  Plan: -[facility] Concierge will call patient 2 months for DM update -Pharmacist follow up televisit scheduled for 3 months -PCP f/u 10/15/21   Subjective: Cynthia Singh is an 81 y.o. year old female who is a primary patient of Cynthia Schwalbe, MD.  The CCM team was consulted for assistance with disease management and care coordination needs.    Engaged with patient by telephone for follow up visit in response to provider referral for pharmacy case management and/or care coordination services.   Consent to Services:  The patient was given information about Chronic Care Management services, agreed to services, and gave verbal consent prior to initiation of services.  Please see initial visit note for detailed documentation.   Patient Care Team: Cynthia Schwalbe, MD as PCP - General (Internal Medicine) End, Cristal Deer, MD as PCP - Cardiology (Cardiology) Kathyrn Sheriff, [facility] as Pharmacist (Pharmacist)  Recent office visits: 07/14/21 Dr Alphonsus Sias OV: f/u DM. A1c 8.6%. Resume Lantus 36 units (PAP). Rx Pantoprazole for GERD. BP 132/84 on amlodipine only. RTC 3 months.  01/15/21 - Tillman Abide, MD - Pt presented for annual exam. Continue current medications. Follow up 3 months.  Recent consult visits: None in previous 6 months  Hospital  visits: None in previous 6 months  Objective:  Lab Results  Component Value Date   CREATININE 0.86 10/17/2020   BUN 14 10/17/2020   GFR 65.15 10/17/2020   GFRNONAA >60 09/20/2019   GFRAA >60 09/20/2019   NA 136 10/17/2020   K 4.3 10/17/2020   CALCIUM 9.3 10/17/2020   CO2 25 10/17/2020   GLUCOSE 217 (H) 10/17/2020    Lab Results  Component Value Date/Time   HGBA1C 8.6 (A) 07/14/2021 02:32 PM   HGBA1C 8.1 (A) 10/17/2020 03:11 PM   HGBA1C 13.8 (H) 05/24/2019 12:01 PM   HGBA1C 11.6 (H) 10/19/2017 10:51 AM   GFR 65.15 10/17/2020 03:56 PM   GFR 67.77 05/24/2019 12:01 PM    Last diabetic Eye exam:  Lab Results  Component Value Date/Time   HMDIABEYEEXA No Retinopathy 01/15/2021 12:00 AM    Last diabetic Foot exam:  Lab Results  Component Value Date/Time   HMDIABFOOTEX done 05/24/2019 12:00 AM     Lab Results  Component Value Date   CHOL 199 10/17/2020   HDL 36.30 (L) 10/17/2020   LDLCALC 62 09/05/2019   LDLDIRECT 88.0 10/17/2020   TRIG (H) 10/17/2020    834.0 Triglyceride is over 400; calculations on Lipids are invalid.   CHOLHDL 5 10/17/2020       Latest Ref Rng & Units 10/17/2020    3:56 PM 09/04/2019    5:26 PM 05/24/2019   12:01 PM  Hepatic Function  Total Protein 6.0 - 8.3 g/dL 6.9   6.4   7.6    Albumin 3.5 - 5.2 g/dL 4.3   3.4   4.4    AST 0 - 37  U/L 18   20   11     ALT 0 - 35 U/L 17   14   13     Alk Phosphatase 39 - 117 U/L 79   74   86    Total Bilirubin 0.2 - 1.2 mg/dL 0.2   0.8   0.5    Bilirubin, Direct 0.0 - 0.2 mg/dL  0.2       Lab Results  Component Value Date/Time   TSH 1.572 10/19/2012 07:44 PM   TSH 1.75 10/05/2011 01:48 PM   FREET4 0.87 10/17/2020 03:56 PM   FREET4 0.95 04/06/2018 09:53 AM       Latest Ref Rng & Units 10/17/2020    3:56 PM 09/20/2019    6:18 AM 09/06/2019    2:05 AM  CBC  WBC 4.0 - 10.5 K/uL 10.1   10.2   12.5    Hemoglobin 12.0 - 15.0 g/dL 16.1   09.6   04.5    Hematocrit 36.0 - 46.0 % 39.5   39.3   41.1    Platelets  150.0 - 400.0 K/uL 277.0   226   224      No results found for: VD25OH  Clinical ASCVD: Yes  The ASCVD Risk score (Arnett DK, et al., 2019) failed to calculate for the following reasons:   The patient has a prior MI or stroke diagnosis       01/15/2021    9:15 AM 10/29/2016    3:28 PM 04/30/2014    4:17 PM  Depression screen PHQ 2/9  Decreased Interest 0 0 0  Down, Depressed, Hopeless 0 1 0  PHQ - 2 Score 0 1 0     Social History   Tobacco Use  Smoking Status Never   Passive exposure: Past  Smokeless Tobacco Never   BP Readings from Last 3 Encounters:  07/14/21 132/84  01/15/21 136/84  10/17/20 118/74   Pulse Readings from Last 3 Encounters:  07/14/21 88  01/15/21 94  10/17/20 78   Wt Readings from Last 3 Encounters:  07/14/21 189 lb (85.7 kg)  01/15/21 197 lb (89.4 kg)  10/17/20 195 lb (88.5 kg)   BMI Readings from Last 3 Encounters:  07/14/21 28.74 kg/m  01/15/21 29.95 kg/m  10/17/20 29.65 kg/m    Assessment/Interventions: Review of patient past medical history, allergies, medications, health status, including review of consultants reports, laboratory and other test data, was performed as part of comprehensive evaluation and provision of chronic care management services.   SDOH:  (Social Determinants of Health) assessments and interventions performed: Yes   SDOH Screenings   Alcohol Screen: Not on file  Depression (PHQ2-9): Low Risk    PHQ-2 Score: 0  Financial Resource Strain: Medium Risk   Difficulty of Paying Living Expenses: Somewhat hard  Food Insecurity: Not on file  Housing: Not on file  Physical Activity: Not on file  Social Connections: Not on file  Stress: Not on file  Tobacco Use: Low Risk    Smoking Tobacco Use: Never   Smokeless Tobacco Use: Never   Passive Exposure: Past  Transportation Needs: No Transportation Needs   Lack of Transportation (Medical): No   Lack of Transportation (Non-Medical): No    CCM Care Plan  No Known  Allergies  Medications Reviewed Today     Reviewed by Kathyrn Sheriff, [facility] (Pharmacist) on 08/01/21 at 1333  Med List Status: <None>   Medication Order Taking? Sig Documenting Provider Last Dose Status Informant  amLODipine (NORVASC)  2.5 MG tablet [id number] Yes TAKE 1 TABLET BY MOUTH DAILY PT NEEDS TO MAKE APPT WITH PROVIDER TO GET FURTHER REFILLS- 1ST ATTEMPT End, Cristal Deer, MD Taking Active   aspirin EC 81 MG tablet [id number] Yes Take 81 mg by mouth daily. [provider] Taking Active Self  atorvastatin (LIPITOR) 40 MG tablet [id number] Yes Take 1 tablet (40 mg total) by mouth daily. Cynthia Schwalbe, MD Taking Active   clopidogrel (PLAVIX) 75 MG tablet [id number] Yes TAKE 1 TABLET BY MOUTH EVERY DAY Cynthia Schwalbe, MD Taking Active   Cranberry 500 MG CAPS [id number] Yes Take 1,000 mg by mouth daily. [provider] Taking Active Self  DULoxetine (CYMBALTA) 30 MG capsule [id number] Yes TAKE 1 CAPSULE BY MOUTH EVERY DAY Alphonsus Sias, Richard I, MD Taking Active   Insulin Pen Needle (PEN NEEDLES) 32G X 5 MM MISC [id number] Yes 1 Units by Does not apply route daily. Cynthia Schwalbe, MD Taking Active Self  LANTUS SOLOSTAR 100 UNIT/ML Solostar Pen [id number] Yes TAKE 36 UNITS DAILY UNDER SKIN Cynthia Schwalbe, MD Taking Active   Menthol, Topical Analgesic, (ASPERCREME MAX ROLL-ON EX) [id number] Yes Apply topically. [provider] Taking Active   metFORMIN (GLUCOPHAGE-XR) 500 MG 24 hr tablet [id number] Yes TAKE 2 TABLETS (1,000 MG TOTAL) BY MOUTH IN THE MORNING AND AT BEDTIME. Cynthia Schwalbe, MD Taking Active   nitroGLYCERIN (NITROSTAT) 0.4 MG SL tablet [id number] Yes Place 1 tablet (0.4 mg total) under the tongue every 5 (five) minutes as needed for chest pain.  Patient taking differently: Place 0.4 mg under the tongue daily. For arm/back pain   Cynthia Schwalbe, MD Taking Active Self  nortriptyline (PAMELOR) 50 MG capsule [id number] Yes TAKE 3 CAPSULES (150 MG TOTAL)  BY MOUTH AT BEDTIME. Cynthia Schwalbe, MD Taking Active   Omega-3 Fatty Acids (FISH OIL PO) [id number] Yes Take 1 capsule by mouth daily. [provider] Taking Active Self  oxyCODONE-acetaminophen (PERCOCET) 10-325 MG tablet [id number] Yes Take 1-2 tablets by mouth every 6 (six) hours as needed for pain. M54.9 Worthy Rancher B, FNP Taking Active   pantoprazole (PROTONIX) 40 MG tablet [id number] Yes Take 1 tablet (40 mg total) by mouth daily. Cynthia Schwalbe, MD Taking Active   polyethylene glycol [facility] / GLYCOLAX) packet [id number] Yes Take 17 g by mouth daily as needed.  [provider] Taking Active Self  pyridOXINE (VITAMIN B-6) 100 MG tablet [id number] Yes Take 100 mg by mouth daily. [provider] Taking Active             Patient Active Problem List   Diagnosis Date Noted   GERD (gastroesophageal reflux disease) 07/14/2021   Vaginal discomfort 06/14/2019   Peripheral vascular disease, unspecified (HCC) 2020   Narcotic dependence (HCC) 10/29/2016   CAD S/P percutaneous coronary angioplasty/DES (RCA-2014/LAD-2016) 07/19/2016   Preventative health care 04/30/2014   Advanced directives, counseling/discussion 04/30/2014   Atherosclerotic heart disease of native coronary artery with angina pectoris (HCC) 10/21/2012   Chest pain 10/19/2012   KNEE PAIN, LEFT, CHRONIC 02/25/2010   Poorly controlled type 2 diabetes mellitus with circulatory disorder (HCC) 12/30/2009   Hyperlipidemia LDL goal <70 12/30/2009   Episodic mood disorder (HCC) 12/30/2009   Essential hypertension 12/30/2009   Chronic back pain 12/30/2009    Immunization History  Administered Date(s) Administered   Fluad Quad(high Dose 65+) 03/21/2020, 01/15/2021   Influenza Split 02/15/2012   Influenza Whole 03/11/2009   Influenza, Seasonal, Injecte, Preservative Fre 03/25/2016   Influenza,inj,Quad  PF,6+ Mos 03/20/2014, 02/09/2015, 02/23/2017, 01/21/2018, 05/24/2019   Moderna Sars-Covid-2  Vaccination 11/08/2019, 12/13/2019, 08/05/2020   Pneumococcal Conjugate-13 05/30/2013   Pneumococcal Polysaccharide-23 05/18/2009   Td 07/16/2008   Zoster Recombinat (Shingrix) 09/15/2018    Conditions to be addressed/monitored:  Hypertension, Hyperlipidemia, Diabetes, Coronary Artery Disease, and Depression  Care Plan : CCM Pharmacy Care Plan  Updates made by Kathyrn Sheriff, RPH since 09/12/2021 12:00 AM     Problem: Hypertension, Hyperlipidemia, Diabetes, Coronary Artery Disease, and Depression   Priority: High     Long-Range Goal: Disease mgmt   Start Date: 06/24/2021  Expected End Date: 08/02/2022  Recent Progress: On track  Priority: High  Note:   Current Barriers:  Unable to independently afford treatment regimen Unable to independently monitor therapeutic efficacy  Pharmacist Clinical Goal(s):  Patient will verbalize ability to afford treatment regimen achieve adherence to monitoring guidelines and medication adherence to achieve therapeutic efficacy through collaboration with PharmD and provider.   Interventions: 1:1 collaboration with Cynthia Schwalbe, MD regarding development and update of comprehensive plan of care as evidenced by provider attestation and co-signature Inter-disciplinary care team collaboration (see longitudinal plan of care) Comprehensive medication review performed; medication list updated in electronic medical record   Hypertension (BP goal <140/90) -Controlled - pt stopped Losartan-HCTZ and carvedilol on her own over a year ago; BP is at goal in recent office visit on amlodipine monotherapy -Current treatment: Amlodipine 2.5 mg daily- Appropriate, Effective, Safe, Accessible -Medications previously tried: losartan, hctz, carvedilol  -Recommend to continue current medication   Diabetes (A1c goal <8%) -Uncontrolled - A1c 8.1% (10/2020); pt has now started Lantus after receiving it through [employer], she reports BG is "better" but does not have  log available during call today; she denies s/sx of hypoglycemia -Current home glucose readings fasting glucos: n/a -Current medications: Metformin 500 mg ER 2 tab BID - Appropriate, Query Effective Lantus 36 units daily (PAP)- Appropriate, Query Effective -Medications previously tried: [place] (cost) -Educated on A1c and blood sugar goals; Benefits of routine self-monitoring of blood sugar; -Advised to drink several glasses of water if BG is every > 400 and contact PCP -Recommended to continue current medication; keep BG log  Hyperlipidemia / CAD (LDL goal < 70) -Not ideally controlled - LDL 88 (10/2020), pt was not on statin at the time (last fill prior to that 02/2020); pt has restarted atorvastatin as of 06/2021 and denies issues -Hx CAD s/p stent 2014, 2016; hx PAD w/o claudication -Current treatment: Atorvastatin 40 mg daily - Appropriate, Query Effective Clopidogrel 75 mg daily - Appropriate, Effective, Safe, Accessible -Medications previously tried: none reported  -Educated on Benefits of statin for ASCVD risk reduction; -Recommend to continue current medication; repeat lipid panel at CPE   Depression/Anxiety (Goal: Improve mood) -Controlled - per pt report -Current treatment: Duloxetine 30 mg daily- Appropriate, Effective, Safe, Accessible Nortriptyline 50 mg - 3 cap HS - Appropriate, Effective, Query Safe  -Recommended to continue current medication    Patient Goals/Self-Care Activities Patient will:  - take medications as prescribed as evidenced by patient report and record review -focus on medication adherence by routine -check glucose daily, document, and provide at future appointments -collaborate with provider on medication access solutions (Lantus)    Medication Assistance:  Lantus - [employer] approved for 2023.  Compliance/Adherence/Medication fill history: Care Gaps: Foot exam due DEXA Scan due  [facility] Drugs: Metformin 500 mg - PDC 100% Atorvastatin  80 mg - PDC 32%; LF 06/30/21 X 90 DS  Patient's [facility] is:  [facility] [address], [place] - [address] [place] [address] Phone: [phone] Fax: [phone]  Uses pill box? No - she has one but has not filled it recently Pt endorses 100% compliance   Care Plan and Follow Up Patient Decision:  Patient agrees to Care Plan and Follow-up.  Follow Up Plan: Telephone follow up appointment with care management team member scheduled for: 3 months  Al Corpus, PharmD, BCACP Clinical Pharmacist [facility] [phone]

## 2021-09-12 NOTE — Patient Instructions (Signed)
Visit Information ? ?Phone number for Pharmacist: [phone] ? ? Goals Addressed   ?None ?  ? ? ?Care Plan : [place]  ?Updates made by Charlton Haws, [place] since 09/12/2021 12:00 AM  ?  ? ?Problem: Hypertension, Hyperlipidemia, Diabetes, Coronary Artery Disease, and Depression   ?Priority: High  ?  ? ?Long-Range Goal: Disease mgmt   ?Start Date: 06/24/2021  ?Expected End Date: 08/02/2022  ?Recent Progress: On track  ?Priority: High  ?Note:   ?Current Barriers:  ?Unable to independently afford treatment regimen ?Unable to independently monitor therapeutic efficacy ? ?Pharmacist Clinical Goal(s):  ?Patient will verbalize ability to afford treatment regimen ?achieve adherence to monitoring guidelines and medication adherence to achieve therapeutic efficacy through collaboration with PharmD and provider.  ? ?Interventions: ?1:1 collaboration with Venia Carbon, MD regarding development and update of comprehensive plan of care as evidenced by provider attestation and co-signature ?Inter-disciplinary care team collaboration (see longitudinal plan of care) ?Comprehensive medication review performed; medication list updated in electronic medical record ?  ?Hypertension (BP goal <140/90) ?-Controlled - pt stopped Losartan-HCTZ and carvedilol on her own over a year ago; BP is at goal in recent office visit on amlodipine monotherapy ?-Current treatment: ?Amlodipine 2.5 mg daily- Appropriate, Effective, Safe, Accessible ?-Medications previously tried: losartan, hctz, carvedilol  ?-Recommend to continue current medication ?  ?Diabetes (A1c goal <8%) ?-Uncontrolled - A1c 8.1% (10/2020); pt has now started Lantus after receiving it through [facility], she reports BG is "better" but does not have log available during call today; she denies s/sx of hypoglycemia ?-Current home glucose readings ?fasting glucos: n/a ?-Current medications: ?Metformin 500 mg ER 2 tab BID - Appropriate, Query Effective ?Lantus 36 units  daily (PAP)- Appropriate, Query Effective ?-Medications previously tried: Geneticist, molecular (cost) ?-Educated on A1c and blood sugar goals; Benefits of routine self-monitoring of blood sugar; ?-Advised to drink several glasses of water if BG is every > 400 and contact PCP ?-Recommended to continue current medication; keep BG log ? ?Hyperlipidemia / CAD (LDL goal < 70) ?-Not ideally controlled - LDL 88 (10/2020), pt was not on statin at the time (last fill prior to that 02/2020); pt has restarted atorvastatin as of 06/2021 and denies issues ?-Hx CAD s/p stent 2014, 2016; hx PAD w/o claudication ?-Current treatment: ?Atorvastatin 40 mg daily - Appropriate, Query Effective ?Clopidogrel 75 mg daily - Appropriate, Effective, Safe, Accessible ?-Medications previously tried: none reported  ?-Educated on Benefits of statin for ASCVD risk reduction; ?-Recommend to continue current medication; repeat lipid panel at CPE ?  ?Depression/Anxiety (Goal: Improve mood) ?-Controlled - per pt report ?-Current treatment: ?Duloxetine 30 mg daily- Appropriate, Effective, Safe, Accessible ?Nortriptyline 50 mg - 3 cap HS - Appropriate, Effective, Query Safe ? -Recommended to continue current medication  ?  ?Patient Goals/Self-Care Activities ?Patient will:  ?- take medications as prescribed as evidenced by patient report and record review ?-focus on medication adherence by routine ?-check glucose daily, document, and provide at future appointments ?-collaborate with provider on medication access solutions (Lantus) ?  ?  ? ?The patient verbalized understanding of instructions, educational materials, and care plan provided today and declined offer to receive copy of patient instructions, educational materials, and care plan.  ?Telephone follow up appointment with pharmacy team member scheduled for: 3 months ? ?Charlene Brooke, PharmD, BCACP ?Clinical Pharmacist ?[facility] at [facility] ?[phone] ?  ?

## 2021-09-14 DIAGNOSIS — F32A Depression, unspecified: Secondary | ICD-10-CM

## 2021-09-14 DIAGNOSIS — E785 Hyperlipidemia, unspecified: Secondary | ICD-10-CM

## 2021-09-14 DIAGNOSIS — Z7984 Long term (current) use of oral hypoglycemic drugs: Secondary | ICD-10-CM

## 2021-09-14 DIAGNOSIS — I10 Essential (primary) hypertension: Secondary | ICD-10-CM

## 2021-09-14 DIAGNOSIS — E1159 Type 2 diabetes mellitus with other circulatory complications: Secondary | ICD-10-CM

## 2021-09-14 DIAGNOSIS — Z794 Long term (current) use of insulin: Secondary | ICD-10-CM

## 2021-09-14 DIAGNOSIS — I2510 Atherosclerotic heart disease of native coronary artery without angina pectoris: Secondary | ICD-10-CM | POA: Diagnosis not present

## 2021-09-18 ENCOUNTER — Other Ambulatory Visit: Payer: Self-pay | Admitting: Internal Medicine

## 2021-09-18 MED ORDER — OXYCODONE-ACETAMINOPHEN 10-325 MG PO TABS: 1.0000 | ORAL_TABLET | Freq: Four times a day (QID) | ORAL | 0 refills | Status: DC | PRN

## 2021-09-18 NOTE — Telephone Encounter (Signed)
Encourage patient to contact the pharmacy for refills or they can request refills through [facility] ? ?Did the patient contact the pharmacy: No ? ?LAST APPOINTMENT DATE:  N/A ? ?NEXT APPOINTMENT DATE: 10/15/2021 ? ?MEDICATION:  oxyCODONE-acetaminophen (PERCOCET) 10-325 MG tablet ? ?Is the patient out of medication? Almost ? ?If not, how much is left? 3 left ? ?Is this a 90 day supply: 30 day supply ? ?[id number] [place] [address]?Phone Number: [phone]? ?Let patient know to contact pharmacy at the end of the day to make sure medication is ready. ? ?Please notify patient to allow 48-72 hours to process ?  ?

## 2021-09-18 NOTE — Telephone Encounter (Signed)
Name of Medication: Oxycodone ?Name of Pharmacy: [facility] ?Last Fill or Written Date and Quantity: 08-20-21 #150 ?Last Office Visit and Type: 07-14-21 ?Next Office Visit and Type: 10-15-21 ?Last Controlled Substance Agreement Date: 10/17/20 ?Last UDS:10/17/20 ?

## 2021-10-15 ENCOUNTER — Ambulatory Visit: Payer: Medicare Other | Admitting: Internal Medicine

## 2021-10-17 ENCOUNTER — Telehealth: Payer: Self-pay

## 2021-10-17 ENCOUNTER — Telehealth: Payer: Self-pay | Admitting: Family Medicine

## 2021-10-17 MED ORDER — OXYCODONE-ACETAMINOPHEN 10-325 MG PO TABS: 1.0000 | ORAL_TABLET | Freq: Four times a day (QID) | ORAL | 0 refills | Status: DC | PRN

## 2021-10-17 NOTE — Telephone Encounter (Signed)
Opened in error

## 2021-10-17 NOTE — Telephone Encounter (Addendum)
[place] CSRS reviewed. PCP has already approved this however due to shortage at first pharmacy, I have sent supply to [facility]. PCP is out of office for the next week.

## 2021-10-17 NOTE — Telephone Encounter (Signed)
Pt called back and said that the pharmacy was out of this medication and asked if it could be called into [facility], [place], [place] [address]

## 2021-10-17 NOTE — Addendum Note (Signed)
Addended by: Ria Bush on: 10/17/2021 04:44 PM   Modules accepted: Orders

## 2021-10-17 NOTE — Telephone Encounter (Signed)
Name of Medication: Oxycodone Name of [facility] Last Fill or Written Date and Quantity: 08-20-21 #150 Last Office Visit and Type: 07-14-21 Next Office Visit and Type: 10-28-21 Last Controlled Substance Agreement Date: 10/17/20 Last UDS:10/17/20

## 2021-10-17 NOTE — Telephone Encounter (Signed)
MEDICATION: oxyCODONE-acetaminophen (PERCOCET) 10-325 MG tablet  [id number] [place] [address]  Comments: Patient has a couple of pills left.   **Let patient know to contact pharmacy at the end of the day to make sure medication is ready. **  ** Please notify patient to allow 48-72 hours to process**  **Encourage patient to contact the pharmacy for refills or they can [facility]*

## 2021-10-28 ENCOUNTER — Encounter: Payer: Self-pay | Admitting: Internal Medicine

## 2021-10-28 ENCOUNTER — Ambulatory Visit (INDEPENDENT_AMBULATORY_CARE_PROVIDER_SITE_OTHER): Payer: Medicare Other | Admitting: Internal Medicine

## 2021-10-28 VITALS — BP 120/72 | HR 86 | Temp 97.2°F | Ht 68.0 in | Wt 190.0 lb

## 2021-10-28 DIAGNOSIS — F1120 Opioid dependence, uncomplicated: Secondary | ICD-10-CM | POA: Diagnosis not present

## 2021-10-28 DIAGNOSIS — E1165 Type 2 diabetes mellitus with hyperglycemia: Secondary | ICD-10-CM

## 2021-10-28 DIAGNOSIS — E1159 Type 2 diabetes mellitus with other circulatory complications: Secondary | ICD-10-CM

## 2021-10-28 DIAGNOSIS — M549 Dorsalgia, unspecified: Secondary | ICD-10-CM

## 2021-10-28 DIAGNOSIS — G8929 Other chronic pain: Secondary | ICD-10-CM

## 2021-10-28 DIAGNOSIS — F39 Unspecified mood [affective] disorder: Secondary | ICD-10-CM | POA: Diagnosis not present

## 2021-10-28 LAB — POCT GLYCOSYLATED HEMOGLOBIN (HGB A1C): Hemoglobin A1C: 8.1 % — AB (ref 4.0–5.6)

## 2021-10-28 MED ORDER — DULOXETINE HCL 60 MG PO CPEP: 60.0000 mg | ORAL_CAPSULE | Freq: Every day | ORAL | 3 refills | Status: DC

## 2021-10-28 NOTE — Progress Notes (Signed)
Subjective:    Patient ID: Cynthia Singh, female    DOB: Sep 21, 1940, 81 y.o.   MRN: [id number]  HPI Here for follow up of chronic pain and diabetes  Has "no energy" Even hard to motivate to clean house Feels her pain control is acceptable Some depression----has been biting nails Not really getting outside much No thoughts of death or suicide Does spend time with grandkids  Back on the insulin--40 a day Checks sugars once every day or 2-----has been over 400, but can be over 200 fasting  Pain is stable  No chest pain Does get some DOE with walking---feels it is being out of shape No edema  Current Outpatient Medications on File Prior to Visit  Medication Sig Dispense Refill   amLODipine (NORVASC) 2.5 MG tablet TAKE 1 TABLET BY MOUTH DAILY PT NEEDS TO MAKE APPT WITH PROVIDER TO GET FURTHER REFILLS- 1ST ATTEMPT 90 tablet 1   aspirin EC 81 MG tablet Take 81 mg by mouth daily.     atorvastatin (LIPITOR) 40 MG tablet Take 1 tablet (40 mg total) by mouth daily. 90 tablet 3   clopidogrel (PLAVIX) 75 MG tablet TAKE 1 TABLET BY MOUTH EVERY DAY 90 tablet 3   DULoxetine (CYMBALTA) 30 MG capsule TAKE 1 CAPSULE BY MOUTH EVERY DAY 90 capsule 2   Insulin Pen Needle (PEN NEEDLES) 32G X 5 MM MISC 1 Units by Does not apply route daily. 100 each 3   LANTUS SOLOSTAR 100 UNIT/ML Solostar Pen TAKE 36 UNITS DAILY UNDER SKIN (Patient taking differently: 40 Units daily. TAKE 40 UNITS DAILY UNDER SKIN) 15 mL 11   Menthol, Topical Analgesic, (ASPERCREME MAX ROLL-ON EX) Apply topically.     metFORMIN (GLUCOPHAGE-XR) 500 MG 24 hr tablet TAKE 2 TABLETS (1,000 MG TOTAL) BY MOUTH IN THE MORNING AND AT BEDTIME. 360 tablet 3   nitroGLYCERIN (NITROSTAT) 0.4 MG SL tablet Place 1 tablet (0.4 mg total) under the tongue every 5 (five) minutes as needed for chest pain. (Patient taking differently: Place 0.4 mg under the tongue daily. For arm/back pain) 30 tablet 0   nortriptyline (PAMELOR) 50 MG capsule TAKE 3  CAPSULES (150 MG TOTAL) BY MOUTH AT BEDTIME. 270 capsule 3   oxyCODONE-acetaminophen (PERCOCET) 10-325 MG tablet Take 1-2 tablets by mouth every 6 (six) hours as needed for pain. M54.9 150 tablet 0   polyethylene glycol (MIRALAX / GLYCOLAX) packet Take 17 g by mouth daily as needed.      pyridOXINE (VITAMIN B-6) 100 MG tablet Take 100 mg by mouth daily.     No current facility-administered medications on file prior to visit.    No Known Allergies  Past Medical History:  Diagnosis Date   CAD (coronary artery disease)    a. Remote nonobstructive disease but in 10/2012 Cath/PCI: s/p DES to RCA. b. cath 02/08/15 DES to prox LAD and balloon angioplasty of ost D1, EF 55-65%; c. 08/2019 PCI/DES prox/distal RCA (overlapping prev RCA stent). Prev placed RCA/LAD stents patent.   Concussion    SAH AFTER FALL 3/18   Depression    Diabetes mellitus, type 2 ([place])    Dyslipidemia    Episodic mood disorder ([place])    Hypertension    Myocardial infarction ([place])    2014   Obesity    Osteoarthritis of spine    knees also   Peripheral vascular disease, unspecified ([place]) 2020   severe disease on home screening by insurance    Past Surgical History:  Procedure Laterality  Date   ABDOMINAL HYSTERECTOMY     APPENDECTOMY  1959   CARDIAC CATHETERIZATION N/A 02/08/2015   Procedure: Left Heart Cath and Coronary Angiography;  Surgeon: Sherren Mocha, MD;  Location: [place] [facility];  Service: Cardiovascular;  Laterality: N/A;   CARDIAC CATHETERIZATION N/A 02/08/2015   Procedure: Coronary Stent Intervention;  Surgeon: Sherren Mocha, MD;  Location: [place] [facility];  Service: Cardiovascular;  Laterality: N/A;   CARDIAC CATHETERIZATION N/A 02/08/2015   Procedure: Intravascular Pressure Wire/FFR Study;  Surgeon: Sherren Mocha, MD;  Location: [place] [facility];  Service: Cardiovascular;  Laterality: N/A;   CATARACT EXTRACTION W/PHACO Left 11/10/2016   Procedure: CATARACT EXTRACTION PHACO AND INTRAOCULAR LENS  PLACEMENT ([place]) suture placed in left eye at end of procedure;  Surgeon: Birder Robson, MD;  Location: [facility];  Service: Ophthalmology;  Laterality: Left;  [place]  3.20 AP% 27.7 CDE 55.63 Fluid pack lot # [id number]   CHOLECYSTECTOMY     COMBINED HYSTERECTOMY ABDOMINAL W/ A&P REPAIR / OOPHORECTOMY  1968   CORONARY ANGIOPLASTY     STENTS X 5   CORONARY STENT INTERVENTION N/A 09/19/2019   Procedure: CORONARY STENT INTERVENTION;  Surgeon: Nelva Bush, MD;  Location: [place] [facility];  Service: Cardiovascular;  Laterality: N/A;   CORONARY STENT PLACEMENT  2014   DILATION AND CURETTAGE OF UTERUS     GALLBLADDER SURGERY  1968   LEFT HEART CATH AND CORONARY ANGIOGRAPHY Left 09/19/2019   Procedure: LEFT HEART CATH AND CORONARY ANGIOGRAPHY;  Surgeon: Nelva Bush, MD;  Location: [place] [facility];  Service: Cardiovascular;  Laterality: Left;   LEFT HEART CATHETERIZATION WITH CORONARY ANGIOGRAM N/A 10/20/2012   Procedure: LEFT HEART CATHETERIZATION WITH CORONARY ANGIOGRAM;  Surgeon: Sherren Mocha, MD;  Location: [facility];  Service: Cardiovascular;  Laterality: N/A;   multiple D&C     TONSILLECTOMY  age 35    Family History  Adopted: Yes  Problem Relation Age of Onset   Cancer Brother        Colon   Cancer Son    Coronary artery disease Neg Hx    Heart attack Neg Hx     Social History   Socioeconomic History   Marital status: Widowed    Spouse name: Not on file   Number of children: 3   Years of education: 109   Highest education level: Not on file  Occupational History   Occupation: CASHIER  Tobacco Use   Smoking status: Never    Passive exposure: Past   Smokeless tobacco: Never  Vaping Use   Vaping Use: Never used  Substance and Sexual Activity   Alcohol use: No   Drug use: No   Sexual activity: Never    Birth control/protection: Post-menopausal  Other Topics Concern   Not on file  Social History Narrative   Widowed '13. 2 sons- '63, '66; one daughter-'69;     30 grandchildren--has raised 9 of her grandchildren 4 great grandchildren.   Lives with 2 granddaughters and adoptive son      Has living will   Probably would have granddaughter York Cerise   Would accept resuscitation but no prolonged ventilation   Not sure about tube feeds   Social Determinants of Health   Financial Resource Strain: Medium Risk (06/24/2021)   Overall Financial Resource Strain (CARDIA)    Difficulty of Paying Living Expenses: Somewhat hard  Food Insecurity: Not on file  Transportation Needs: No Transportation Needs (06/24/2021)   PRAPARE - Transportation  Lack of Transportation (Medical): No    Lack of Transportation (Non-Medical): No  Physical Activity: Not on file  Stress: Not on file  Social Connections: Not on file  Intimate Partner Violence: Not on file   Review of Systems Sleeps okay with the nortriptyline Nocturia x 2-3 still Appetite is off lately Weight is stable     Objective:   Physical Exam Constitutional:      Appearance: Normal appearance.  Cardiovascular:     Rate and Rhythm: Normal rate and regular rhythm.     Heart sounds: No murmur heard.    No gallop.  Pulmonary:     Effort: Pulmonary effort is normal.     Breath sounds: Normal breath sounds. No wheezing or rales.  Musculoskeletal:     Cervical back: Neck supple.  Lymphadenopathy:     Cervical: No cervical adenopathy.  Neurological:     Mental Status: She is alert.  Psychiatric:     Comments: Melancholy but not overtly depressed Normal appearance and dress            Assessment & Plan:

## 2021-10-28 NOTE — Assessment & Plan Note (Signed)
More down now---seems reactive Not MDD ?related to pain, etc Will try increasing the duloxetine to '60mg'$  daily

## 2021-10-28 NOTE — Assessment & Plan Note (Signed)
Satisfied with pain control with 5 oxycodone acetaminophen daily (10/325)

## 2021-10-28 NOTE — Assessment & Plan Note (Signed)
Lab Results  Component Value Date   HGBA1C 8.1 (A) 10/28/2021   Better control back on lantus 40 units daily Also metformin 1000 bid

## 2021-10-28 NOTE — Assessment & Plan Note (Signed)
PDMP reviewed  No concerns Contract done---will check UDS

## 2021-10-30 LAB — DRUG MONITORING, PANEL 8 WITH CONFIRMATION, URINE
6 Acetylmorphine: NEGATIVE ng/mL (ref <10)
Alcohol Metabolites: NEGATIVE ng/mL (ref <500)
Amphetamines: NEGATIVE ng/mL (ref <500)
Benzodiazepines: NEGATIVE ng/mL (ref <100)
Buprenorphine, Urine: NEGATIVE ng/mL (ref <5)
Cocaine Metabolite: NEGATIVE ng/mL (ref <150)
Codeine: NEGATIVE ng/mL (ref <50)
Creatinine: 67.5 mg/dL (ref >=20.0)
Hydrocodone: NEGATIVE ng/mL (ref <50)
Hydromorphone: NEGATIVE ng/mL (ref <50)
MDMA: NEGATIVE ng/mL (ref <500)
Marijuana Metabolite: NEGATIVE ng/mL (ref <20)
Morphine: NEGATIVE ng/mL (ref <50)
Norhydrocodone: NEGATIVE ng/mL (ref <50)
Noroxycodone: 8091 ng/mL — ABNORMAL HIGH (ref <50)
Opiates: NEGATIVE ng/mL (ref <100)
Oxidant: NEGATIVE ug/mL (ref <200)
Oxycodone: 5536 ng/mL — ABNORMAL HIGH (ref <50)
Oxycodone: POSITIVE ng/mL — AB (ref <100)
Oxymorphone: 1667 ng/mL — ABNORMAL HIGH (ref <50)
pH: 5.8 (ref 4.5–9.0)

## 2021-10-30 LAB — DM TEMPLATE

## 2021-11-03 ENCOUNTER — Telehealth: Payer: Self-pay

## 2021-11-03 NOTE — Progress Notes (Signed)
Chronic Care Management Pharmacy Assistant   Name: Cynthia Singh  MRN: [id number] DOB: [birth year]  Reason for Encounter: CCM (Diabetes Disease State)  Recent office visits:  10/28/21 [name], MD Back Pain Change (increase): Duloxetine 60 mg Stop (patient): Cranberry Stop (patient): Pantoprazole 40 mg Stop (completed): Omega-3 fatty acids FU 3 months  Recent consult visits:  None since last CCM contact  Hospital visits:  None in previous 6 months  Medications: Outpatient Encounter Medications as of 11/03/2021  Medication Sig   amLODipine (NORVASC) 2.5 MG tablet TAKE 1 TABLET BY MOUTH DAILY PT NEEDS TO MAKE APPT WITH PROVIDER TO GET FURTHER REFILLS- 1ST ATTEMPT   aspirin EC 81 MG tablet Take 81 mg by mouth daily.   atorvastatin (LIPITOR) 40 MG tablet Take 1 tablet (40 mg total) by mouth daily.   clopidogrel (PLAVIX) 75 MG tablet TAKE 1 TABLET BY MOUTH EVERY DAY   DULoxetine (CYMBALTA) 60 MG capsule Take 1 capsule (60 mg total) by mouth daily.   Insulin Pen Needle (PEN NEEDLES) 32G X 5 MM MISC 1 Units by Does not apply route daily.   LANTUS SOLOSTAR 100 UNIT/ML Solostar Pen TAKE 36 UNITS DAILY UNDER SKIN (Patient taking differently: 40 Units daily. TAKE 40 UNITS DAILY UNDER SKIN)   Menthol, Topical Analgesic, (ASPERCREME MAX ROLL-ON EX) Apply topically.   metFORMIN (GLUCOPHAGE-XR) 500 MG 24 hr tablet TAKE 2 TABLETS (1,000 MG TOTAL) BY MOUTH IN THE MORNING AND AT BEDTIME.   nitroGLYCERIN (NITROSTAT) 0.4 MG SL tablet Place 1 tablet (0.4 mg total) under the tongue every 5 (five) minutes as needed for chest pain. (Patient taking differently: Place 0.4 mg under the tongue daily. For arm/back pain)   nortriptyline (PAMELOR) 50 MG capsule TAKE 3 CAPSULES (150 MG TOTAL) BY MOUTH AT BEDTIME.   oxyCODONE-acetaminophen (PERCOCET) 10-325 MG tablet Take 1-2 tablets by mouth every 6 (six) hours as needed for pain. M54.9   polyethylene glycol (MIRALAX / GLYCOLAX) packet Take 17 g by mouth  daily as needed.    pyridOXINE (VITAMIN B-6) 100 MG tablet Take 100 mg by mouth daily.   No facility-administered encounter medications on file as of 11/03/2021.   Recent Relevant Labs: Lab Results  Component Value Date/Time   HGBA1C 8.1 (A) 10/28/2021 10:47 AM   HGBA1C 8.6 (A) 07/14/2021 02:32 PM   HGBA1C 13.8 (H) 05/24/2019 12:01 PM   HGBA1C 11.6 (H) 10/19/2017 10:51 AM    Kidney Function Lab Results  Component Value Date/Time   CREATININE 0.86 10/17/2020 03:56 PM   CREATININE 0.86 09/20/2019 06:18 AM   GFR 65.15 10/17/2020 03:56 PM   GFRNONAA >60 09/20/2019 06:18 AM   GFRAA >60 09/20/2019 06:18 AM    Contacted patient on 11/03/2021 to discuss diabetes disease state.   Current antihyperglycemic regimen:  Metformin 500 mg ER 2 tab BID  Lantus 36 units daily (PAP) Patient verbally confirms she is taking the above medications as directed. Yes  What recent interventions/DTPs have been made to improve glycemic control:  Advised to drink several glasses of water if BG is every > 400 and contact PCP Recommended to continue current medication; keep BG log  Have there been any recent hospitalizations or ED visits since last visit with CPP? No  Patient denies hypoglycemic symptoms, including Pale, Sweaty, Shaky, Hungry, Nervous/irritable, and Vision changes  Patient denies hyperglycemic symptoms, including blurry vision, excessive thirst, fatigue, polyuria, and weakness  How often are you checking your blood sugar? Every couple of days. Patient did  not have any readings. I have asked patient to take their blood glucose daily and keep a log. Advised patient I would call back on 11/07/2021 for log. Patient verbalized understanding and agreed.   During the week, how often does your blood glucose drop below 70? Never  Are you checking your feet daily/regularly? Yes  Adherence Review: Is the patient currently on a STATIN medication? Yes Is the patient currently on ACE/ARB  medication? No Does the patient have >5 day gap between last estimated fill dates? No  Care Gaps: Annual wellness visit in last year? No Most recent A1C reading:  8.1 on 10/28/2021 Most Recent BP reading: 120/72 on 10/28/2021  Last eye exam / retinopathy screening: Up to date Last diabetic foot exam: 05/24/2019  Star Rating Drugs:  Medication:  Last Fill: Day Supply Metformin 500 mg 08/20/2021 90 Atorvastatin 80 mg 10/24/2021 90  CCM appointment on 12/03/2021  Charlene Brooke, CPP notified  [name], [place] Assistant [phone]

## 2021-11-07 NOTE — Progress Notes (Addendum)
Called patient for their blood glucose log as previously discussed. No answer; left message.   Al Corpus, CPP notified  Claudina Lick, [place] Clinical Pharmacy Assistant [phone]

## 2021-11-11 ENCOUNTER — Telehealth: Payer: Self-pay

## 2021-11-11 NOTE — Telephone Encounter (Signed)
Left message on VM that her pt asst Lantus is here at the office. It is in the fridge closest to the breakroom.

## 2021-11-17 ENCOUNTER — Other Ambulatory Visit: Payer: Self-pay

## 2021-11-17 MED ORDER — OXYCODONE-ACETAMINOPHEN 10-325 MG PO TABS: 1.0000 | ORAL_TABLET | Freq: Four times a day (QID) | ORAL | 0 refills | Status: DC | PRN

## 2021-11-17 NOTE — Telephone Encounter (Signed)
Name of Medication: Oxycodone Name of Pharmacy: [facility] Last Fill or Written Date and Quantity: 10-17-21 #150 Last Office Visit and Type: 10-28-21 Next Office Visit and Type: 02-19-22 Last Controlled Substance Agreement Date: 10/28/21 Last UDS:10/28/21

## 2021-11-19 NOTE — Progress Notes (Signed)
Called patient to inform her that Lantus has been delivered to the office and she can pick it up; no answer; left message.  Charlene Brooke, CPP notified  [name], [place] Clinical Pharmacy Assistant [phone]

## 2021-11-28 ENCOUNTER — Telehealth: Payer: Self-pay

## 2021-11-28 NOTE — Telephone Encounter (Signed)
error 

## 2021-11-28 NOTE — Progress Notes (Signed)
    Chronic Care Management Pharmacy Assistant   Name: Cynthia Singh  MRN: [id number] DOB: 11/09/1940  Reason for Encounter: CCM (Appointment Reminder)  Medications: Outpatient Encounter Medications as of 11/28/2021  Medication Sig   amLODipine (NORVASC) 2.5 MG tablet TAKE 1 TABLET BY MOUTH DAILY PT NEEDS TO MAKE APPT WITH PROVIDER TO GET FURTHER REFILLS- 1ST ATTEMPT   aspirin EC 81 MG tablet Take 81 mg by mouth daily.   atorvastatin (LIPITOR) 40 MG tablet Take 1 tablet (40 mg total) by mouth daily.   clopidogrel (PLAVIX) 75 MG tablet TAKE 1 TABLET BY MOUTH EVERY DAY   DULoxetine (CYMBALTA) 60 MG capsule Take 1 capsule (60 mg total) by mouth daily.   Insulin Pen Needle (PEN NEEDLES) 32G X 5 MM MISC 1 Units by Does not apply route daily.   LANTUS SOLOSTAR 100 UNIT/ML Solostar Pen TAKE 36 UNITS DAILY UNDER SKIN (Patient taking differently: 40 Units daily. TAKE 40 UNITS DAILY UNDER SKIN)   Menthol, Topical Analgesic, (ASPERCREME MAX ROLL-ON EX) Apply topically.   metFORMIN (GLUCOPHAGE-XR) 500 MG 24 hr tablet TAKE 2 TABLETS (1,000 MG TOTAL) BY MOUTH IN THE MORNING AND AT BEDTIME.   nitroGLYCERIN (NITROSTAT) 0.4 MG SL tablet Place 1 tablet (0.4 mg total) under the tongue every 5 (five) minutes as needed for chest pain. (Patient taking differently: Place 0.4 mg under the tongue daily. For arm/back pain)   nortriptyline (PAMELOR) 50 MG capsule TAKE 3 CAPSULES (150 MG TOTAL) BY MOUTH AT BEDTIME.   oxyCODONE-acetaminophen (PERCOCET) 10-325 MG tablet Take 1-2 tablets by mouth every 6 (six) hours as needed for pain. M54.9   polyethylene glycol (MIRALAX / GLYCOLAX) packet Take 17 g by mouth daily as needed.    pyridOXINE (VITAMIN B-6) 100 MG tablet Take 100 mg by mouth daily.   No facility-administered encounter medications on file as of 11/28/2021.   Burley Saver was contacted to remind of upcoming telephone visit with Charlene Brooke  on 12/03/2021 at 3:45. Patient was reminded to have any  blood glucose and blood pressure readings available for review at appointment.   Message was left reminding patient of appointment.  CCM referral has been placed prior to visit?  Yes   Star Rating Drugs: Medication:  Last Fill: Day Supply Atorvastatin 40 mg 10/24/2021 90 Metformin 500 mg 11/05/2021 [place], CPP notified  [name], [place] Clinical Pharmacy Assistant [phone]

## 2021-12-03 ENCOUNTER — Telehealth: Payer: Medicare Other

## 2021-12-03 NOTE — Telephone Encounter (Signed)
  Chronic Care Management   Outreach Note  12/03/2021 Name: Cynthia Singh MRN: [id number] DOB: 1941-05-02  Referred by: Venia Carbon, MD  Patient had a phone appointment scheduled with clinical pharmacist today.  An unsuccessful telephone outreach was attempted today. The patient was referred to the pharmacist for assistance with medications, care management and care coordination.   Patient will NOT be penalized in any way for missing a CCM appointment. The no-show fee does not apply.  Phone number invalid, call unable to be completed, voicemail unavailable.   Charlene Brooke, PharmD, BCACP Clinical Pharmacist [facility] [phone]

## 2021-12-03 NOTE — Progress Notes (Deleted)
Chronic Care Management Pharmacy Note 06/24/2021 Name:  Cynthia Singh MRN:  [id number] DOB:  08-17-1940  Summary: CCM F/U visit -Pt endorses compliance with medications on current list -Pt received Lantus through [facility]; she does not have BG readings available during call but reports they are "better" since starting insulin; she denies s/sx of hypoglycemia -Pt endorses compliance with atorvastatin - just restarted 06/2021. Most recent lipid panel from 10/2020 (LDL 88) pt was not taking statin  Recommendations/Changes made from today's visit: -Keep BG log and provide at future visits -Recommend repeat lipid panel with next CPE  Plan: -[place] will call patient 2 months for DM update -Pharmacist follow up televisit scheduled for 3 months -PCP f/u 10/15/21   Subjective: Cynthia Singh is an 81 y.o. year old female who is a primary patient of Cynthia Carbon, MD.  The CCM team was consulted for assistance with disease management and care coordination needs.    Engaged with patient by telephone for follow up visit in response to provider referral for pharmacy case management and/or care coordination services.   Consent to Services:  The patient was given information about Chronic Care Management services, agreed to services, and gave verbal consent prior to initiation of services.  Please see initial visit note for detailed documentation.   Patient Care Team: Cynthia Carbon, MD as PCP - General (Internal Medicine) End, Harrell Gave, MD as PCP - Cardiology (Cardiology) Charlton Haws, [facility] as Pharmacist (Pharmacist)  Recent office visits: 10/28/21 Dr Silvio Pate OV: f/u - A1c 8.1%. Low energy. Increase duloxetine to 60 mg. RTC 3 months. D/C pantoprazole (per pt).  07/14/21 Dr Silvio Pate OV: f/u DM. A1c 8.6%. Resume Lantus 36 units (PAP). Rx Pantoprazole for GERD. BP 132/84 on amlodipine only. RTC 3 months.  01/15/21 - [name], MD - Pt presented for annual exam.  Continue current medications. Follow up 3 months.  Recent consult visits: None in previous 6 months  Hospital visits: None in previous 6 months  Objective:  Lab Results  Component Value Date   CREATININE 0.86 10/17/2020   BUN 14 10/17/2020   GFR 65.15 10/17/2020   GFRNONAA >60 09/20/2019   GFRAA >60 09/20/2019   NA 136 10/17/2020   K 4.3 10/17/2020   CALCIUM 9.3 10/17/2020   CO2 25 10/17/2020   GLUCOSE 217 (H) 10/17/2020    Lab Results  Component Value Date/Time   HGBA1C 8.1 (A) 10/28/2021 10:47 AM   HGBA1C 8.6 (A) 07/14/2021 02:32 PM   HGBA1C 13.8 (H) 05/24/2019 12:01 PM   HGBA1C 11.6 (H) 10/19/2017 10:51 AM   GFR 65.15 10/17/2020 03:56 PM   GFR 67.77 05/24/2019 12:01 PM    Last diabetic Eye exam:  Lab Results  Component Value Date/Time   HMDIABEYEEXA No Retinopathy 01/15/2021 12:00 AM    Last diabetic Foot exam:  Lab Results  Component Value Date/Time   HMDIABFOOTEX done 05/24/2019 12:00 AM     Lab Results  Component Value Date   CHOL 199 10/17/2020   HDL 36.30 (L) 10/17/2020   LDLCALC 62 09/05/2019   LDLDIRECT 88.0 10/17/2020   TRIG (H) 10/17/2020    834.0 Triglyceride is over 400; calculations on Lipids are invalid.   CHOLHDL 5 10/17/2020       Latest Ref Rng & Units 10/17/2020    3:56 PM 09/04/2019    5:26 PM 05/24/2019   12:01 PM  Hepatic Function  Total Protein 6.0 - 8.3 g/dL 6.9  6.4  7.6  Albumin 3.5 - 5.2 g/dL 4.3  3.4  4.4   AST 0 - 37 U/L _0 ALT 0 - 35 U/L _1 Alk Phosphatase 39 - 117 U/L 79  74  86   Total Bilirubin 0.2 - 1.2 mg/dL 0.2  0.8  0.5   Bilirubin, Direct 0.0 - 0.2 mg/dL  0.2      Lab Results  Component Value Date/Time   TSH 1.572 10/19/2012 07:44 PM   TSH 1.75 10/05/2011 01:48 PM   FREET4 0.87 10/17/2020 03:56 PM   FREET4 0.95 04/06/2018 09:53 AM       Latest Ref Rng & Units 10/17/2020    3:56 PM 09/20/2019    6:18 AM 09/06/2019    2:05 AM  CBC  WBC 4.0 - 10.5 K/uL 10.1  10.2  12.5   Hemoglobin 12.0  - 15.0 g/dL 13.7  13.2  14.2   Hematocrit 36.0 - 46.0 % 39.5  39.3  41.1   Platelets 150.0 - 400.0 K/uL 277.0  226  224     No results found for: "VD25OH"  Clinical ASCVD: Yes  The ASCVD Risk score (Arnett DK, et al., 2019) failed to calculate for the following reasons:   The patient has a prior MI or stroke diagnosis       01/15/2021    9:15 AM 10/29/2016    3:28 PM 04/30/2014    4:17 PM  Depression screen PHQ 2/9  Decreased Interest 0 0 0  Down, Depressed, Hopeless 0 1 0  PHQ - 2 Score 0 1 0     Social History   Tobacco Use  Smoking Status Never   Passive exposure: Past  Smokeless Tobacco Never   BP Readings from Last 3 Encounters:  10/28/21 120/72  07/14/21 132/84  01/15/21 136/84   Pulse Readings from Last 3 Encounters:  10/28/21 86  07/14/21 88  01/15/21 94   Wt Readings from Last 3 Encounters:  10/28/21 190 lb (86.2 kg)  07/14/21 189 lb (85.7 kg)  01/15/21 197 lb (89.4 kg)   BMI Readings from Last 3 Encounters:  10/28/21 28.89 kg/m  07/14/21 28.74 kg/m  01/15/21 29.95 kg/m    Assessment/Interventions: Review of patient past medical history, allergies, medications, health status, including review of consultants reports, laboratory and other test data, was performed as part of comprehensive evaluation and provision of chronic care management services.   SDOH:  (Social Determinants of Health) assessments and interventions performed: Yes   SDOH Screenings   Alcohol Screen: Not on file  Depression (PHQ2-9): Low Risk  (01/15/2021)   Depression (PHQ2-9)    PHQ-2 Score: 0  Financial Resource Strain: Medium Risk (06/24/2021)   Overall Financial Resource Strain (CARDIA)    Difficulty of Paying Living Expenses: Somewhat hard  Food Insecurity: Not on file  Housing: Not on file  Physical Activity: Not on file  Social Connections: Not on file  Stress: Not on file  Tobacco Use: Low Risk  (10/28/2021)   Patient History    Smoking Tobacco Use: Never     Smokeless Tobacco Use: Never    Passive Exposure: Past  Transportation Needs: No Transportation Needs (06/24/2021)   PRAPARE - Transportation    Lack of Transportation (Medical): No    Lack of Transportation (Non-Medical): No    CCM Care Plan  No Known Allergies  Medications Reviewed Today     Reviewed by Cynthia Carbon, MD (Physician) on 10/28/21  at [place] List Status: <None>   Medication Order Taking? Sig Documenting Provider Last Dose Status Informant  amLODipine (NORVASC) 2.5 MG tablet [id number] Yes TAKE 1 TABLET BY MOUTH DAILY PT NEEDS TO MAKE APPT WITH PROVIDER TO GET FURTHER REFILLS- 1ST ATTEMPT End, Harrell Gave, MD Taking Active   aspirin EC 81 MG tablet [id number] Yes Take 81 mg by mouth daily. [provider] Taking Active Self  atorvastatin (LIPITOR) 40 MG tablet [id number] Yes Take 1 tablet (40 mg total) by mouth daily. Cynthia Carbon, MD Taking Active   clopidogrel (PLAVIX) 75 MG tablet [id number] Yes TAKE 1 TABLET BY MOUTH EVERY DAY Cynthia Carbon, MD Taking Active   DULoxetine (CYMBALTA) 30 MG capsule [id number] Yes TAKE 1 CAPSULE BY MOUTH EVERY DAY Cynthia Carbon, MD Taking Active   Insulin Pen Needle (PEN NEEDLES) 32G X 5 MM MISC [id number] Yes 1 Units by Does not apply route daily. Cynthia Carbon, MD Taking Active Self  LANTUS SOLOSTAR 100 UNIT/ML Solostar Pen [id number] Yes TAKE 36 UNITS DAILY UNDER SKIN  Patient taking differently: 40 Units daily. TAKE 40 UNITS DAILY UNDER SKIN   Cynthia Carbon, MD Taking Active   Menthol, Topical Analgesic, (ASPERCREME MAX ROLL-ON EX) [id number] Yes Apply topically. [provider] Taking Active   metFORMIN (GLUCOPHAGE-XR) 500 MG 24 hr tablet [id number] Yes TAKE 2 TABLETS (1,000 MG TOTAL) BY MOUTH IN THE MORNING AND AT BEDTIME. Cynthia Carbon, MD Taking Active   nitroGLYCERIN (NITROSTAT) 0.4 MG SL tablet [id number] Yes Place 1 tablet (0.4 mg total) under the tongue every 5 (five) minutes as needed for  chest pain.  Patient taking differently: Place 0.4 mg under the tongue daily. For arm/back pain   Cynthia Carbon, MD Taking Active Self  nortriptyline (PAMELOR) 50 MG capsule [id number] Yes TAKE 3 CAPSULES (150 MG TOTAL) BY MOUTH AT BEDTIME. Cynthia Carbon, MD Taking Active   oxyCODONE-acetaminophen (PERCOCET) 10-325 MG tablet [id number] Yes Take 1-2 tablets by mouth every 6 (six) hours as needed for pain. M54.9 Ria Bush, MD Taking Active   polyethylene glycol [facility] / GLYCOLAX) packet [id number] Yes Take 17 g by mouth daily as needed.  [provider] Taking Active Self  pyridOXINE (VITAMIN B-6) 100 MG tablet [id number] Yes Take 100 mg by mouth daily. [provider] Taking Active             Patient Active Problem List   Diagnosis Date Noted   GERD (gastroesophageal reflux disease) 07/14/2021   Vaginal discomfort 06/14/2019   Peripheral vascular disease, unspecified ([place]) 2020   Narcotic dependence ([place]) 10/29/2016   CAD S/P percutaneous coronary angioplasty/DES (RCA-2014/LAD-2016) 07/19/2016   Preventative health care 04/30/2014   Advanced directives, counseling/discussion 04/30/2014   Atherosclerotic heart disease of native coronary artery with angina pectoris ([place]) 10/21/2012   Chest pain 10/19/2012   KNEE PAIN, LEFT, CHRONIC 02/25/2010   Poorly controlled type 2 diabetes mellitus with circulatory disorder ([place]) 12/30/2009   Hyperlipidemia LDL goal <70 12/30/2009   Episodic mood disorder ([place]) 12/30/2009   Essential hypertension 12/30/2009   Chronic back pain 12/30/2009    Immunization History  Administered Date(s) Administered   Fluad Quad(high Dose 65+) 03/21/2020, 01/15/2021   Influenza Split 02/15/2012   Influenza Whole 03/11/2009   Influenza, Seasonal, Injecte, Preservative Fre 03/25/2016   Influenza,inj,Quad PF,6+ Mos 03/20/2014, 02/09/2015, 02/23/2017, 01/21/2018, 05/24/2019   Moderna Sars-Covid-2 Vaccination 11/08/2019, 12/13/2019,  08/05/2020   Pneumococcal Conjugate-13 05/30/2013   Pneumococcal Polysaccharide-23 05/18/2009  Td 07/16/2008   Zoster Recombinat (Shingrix) 09/15/2018    Conditions to be addressed/monitored:  Hypertension, Hyperlipidemia, Diabetes, Coronary Artery Disease, and Depression  There are no care plans that you recently modified to display for this patient.   Medication Assistance:  Lantus - Sanofi approved for 2023.  Compliance/Adherence/Medication fill history: Care Gaps: Foot exam due DEXA Scan due  Star-Rating Drugs: Metformin 500 mg - PDC 100% Atorvastatin 40 mg - PDC 79%; LF 10/24/21 x 90 ds  Medication Access: Within the past 30 days, how often has patient missed a dose of medication? *** Is a pillbox or other method used to improve adherence? {YES/NO:21197} Factors that may affect medication adherence? {CHL DESC; BARRIERS:21522} Are meds synced by current pharmacy? {YES/NO:21197} Are meds delivered by current pharmacy? {YES/NO:21197} Does patient experience delays in picking up medications due to transportation concerns? {YES/NO:21197}  [facility] Reviewed: Is patient disadvantaged to use [facility]?: {YES/NO:21197} Current Rx insurance plan: *** Name and location of Current pharmacy[facility]  [id number] [place] [place] [id number]: [phone]: [phone] [id number] [place], [place] [address]: [phone]: [phone]: [phone]: [phone] services reviewed with patient today?: {YES/NO:21197} Patient requests to transfer care to [facility]?: {YES/NO:21197} Reason patient declined to change pharmacies: {US patient preference:27474}   Care Plan and Follow  Up Patient Decision:  Patient agrees to Care Plan and Follow-up.  Follow Up Plan: Telephone follow up appointment with care management team member scheduled for: 3 months  LiCharlene BrookePharmD, BCACP Clinical Pharmacist [phone] Current Barriers:  Unable to independently afford treatment regimen Unable to independently monitor therapeutic efficacy  Pharmacist Clinical Goal(s):  Patient will verbalize ability to afford treatment regimen achieve adherence to monitoring guidelines and medication adherence to achieve therapeutic efficacy through collaboration with PharmD and provider.   Interventions: 1:1 collaboration with LeVenia CarbonMD regarding development and update of comprehensive plan of care as evidenced by provider attestation and co-signature Inter-disciplinary care team collaboration (see longitudinal plan of care) Comprehensive medication review performed; medication list updated in electronic medical record   Hypertension (BP goal <140/90) -Controlled - pt stopped Losartan-HCTZ and carvedilol on her own over a year ago; BP is at goal in recent office visit on amlodipine monotherapy -Current treatment: Amlodipine 2.5 mg daily- Appropriate, Effective, Safe, Accessible -Medications previously tried: losartan, hctz, carvedilol  -Recommend to continue current medication   Diabetes (A1c goal <8%) -Uncontrolled - A1c 8.1% (10/2021); pt has now started Lantus after receiving it through [place] reports BG is "better" but does not have log available during call today; she denies s/sx of hypoglycemia -Current home glucose readings fasting glucos: n/a -Current medications: Metformin 500 mg ER -2 tab BID - Appropriate, Query Effective Lantus 40 units daily (PAP)- Appropriate, Query Effective -Medications previously tried: Ja[place]han[place]ost) -Educated on A1c and blood sugar goals; Benefits of routine self-monitoring of blood  sugar; -Advised to drink several glasses of water if BG is every > 400 and contact PCP -Recommended to continue current medication; keep BG log  Hyperlipidemia / CAD (LDL goal < 70) -Not ideally controlled - LDL 88 (10/2020), pt was not on statin at the time (last fill  prior to that 02/2020); pt has restarted atorvastatin as of 06/2021 and denies issues -Hx CAD s/p stent 2014, 2016; hx PAD w/o claudication -Current treatment: Atorvastatin 40 mg daily - Appropriate, Query Effective Clopidogrel 75 mg daily - Appropriate, Effective, Safe, Accessible -Medications previously tried: none reported  -Educated on Benefits of statin for ASCVD risk reduction; -Recommend to continue current medication; repeat lipid panel at CPE   Depression/Anxiety (Goal: Improve mood) -Controlled - per pt report -Current treatment: Duloxetine 60 mg daily- Appropriate, Effective, Safe, Accessible Nortriptyline 50 mg - 3 cap HS - Appropriate, Effective, Query Safe  -Recommended to continue current medication    Patient Goals/Self-Care Activities Patient will:  - take medications as prescribed as evidenced by patient report and record review -focus on medication adherence by routine -check glucose daily, document, and provide at future appointments -collaborate with provider on medication access solutions (Lantus)

## 2021-12-07 ENCOUNTER — Other Ambulatory Visit: Payer: Self-pay | Admitting: Internal Medicine

## 2021-12-10 ENCOUNTER — Other Ambulatory Visit: Payer: Self-pay | Admitting: Internal Medicine

## 2021-12-10 NOTE — Telephone Encounter (Signed)
Spoke with patient, she will come pick up the Lantus as soon as she can.

## 2021-12-11 NOTE — Telephone Encounter (Signed)
Cynthia Singh picked up medication for pt today.

## 2021-12-15 ENCOUNTER — Other Ambulatory Visit: Payer: Self-pay

## 2021-12-15 MED ORDER — OXYCODONE-ACETAMINOPHEN 10-325 MG PO TABS: 1.0000 | ORAL_TABLET | Freq: Four times a day (QID) | ORAL | 0 refills | Status: DC | PRN

## 2021-12-15 NOTE — Telephone Encounter (Signed)
MEDICATION: oxyCODONE-acetaminophen (PERCOCET) 10-325 MG tablet  [facility] [address] [place], [place] [address]  Comments:   **Let patient know to contact pharmacy at the end of the day to make sure medication is ready. **  ** Please notify patient to allow 48-72 hours to process**  **Encourage patient to contact the pharmacy for refills or they can request refills through [facility]**

## 2021-12-15 NOTE — Telephone Encounter (Signed)
Name of Medication: Oxycodone [facility] Last Fill or Written Date and Quantity: 11-17-21 #150 Last Office Visit and Type: 10-28-21 Next Office Visit and Type: 02-19-22 Last Controlled Substance Agreement Date: 10/28/21 Last UDS:10/28/21

## 2022-01-13 ENCOUNTER — Other Ambulatory Visit: Payer: Self-pay | Admitting: Internal Medicine

## 2022-01-13 MED ORDER — OXYCODONE-ACETAMINOPHEN 10-325 MG PO TABS: 1.0000 | ORAL_TABLET | Freq: Four times a day (QID) | ORAL | 0 refills | Status: DC | PRN

## 2022-01-13 NOTE — Telephone Encounter (Signed)
Name of Medication: Oxycodone Name of Pharmacy[facility] Last Fill or Written Date and Quantity: 12-15-21 #150 Last Office Visit and Type: 10-28-21 Next Office Visit and Type: 02-19-22 Last Controlled Substance Agreement Date: 10/28/21 Last UDS:10/28/21

## 2022-01-13 NOTE — Telephone Encounter (Signed)
  Encourage patient to contact the pharmacy for refills or they can request refills through [facility]  Did the patient contact the pharmacy:  yes   LAST APPOINTMENT DATE:  Please schedule appointment if longer than 1 year  NEXT APPOINTMENT DATE:02/19/2022  MEDICATION:oxyCODONE-acetaminophen (PERCOCET) 10-325 MG tablet  Is the patient out of medication? no  If not, how much is left? today is last date  Is this a 90 day supply: yes  [facility] [address]  Let patient know to contact pharmacy at the end of the day to make sure medication is ready.  Please notify patient to allow 48-72 hours to process  CLINICAL FILLS OUT ALL BELOW:

## 2022-02-02 ENCOUNTER — Other Ambulatory Visit: Payer: Self-pay | Admitting: Internal Medicine

## 2022-02-11 ENCOUNTER — Other Ambulatory Visit: Payer: Self-pay | Admitting: Internal Medicine

## 2022-02-11 MED ORDER — OXYCODONE-ACETAMINOPHEN 10-325 MG PO TABS: 1.0000 | ORAL_TABLET | Freq: Four times a day (QID) | ORAL | 0 refills | Status: DC | PRN

## 2022-02-11 NOTE — Telephone Encounter (Signed)
Name of Medication: Oxycodone Name of [facility] Last Fill or Written Date and Quantity: 01-13-22 #150 Last Office Visit and Type: 10-28-21 Next Office Visit and Type: 02-19-22 Last Controlled Substance Agreement Date: 10/28/21 Last UDS:10/28/21

## 2022-02-11 NOTE — Telephone Encounter (Signed)
Caller Name: Kanisha Matura Call back phone #: [phone]  MEDICATION(S):  oxyCODONE-acetaminophen (PERCOCET) 10-325 MG tablet  Days of Med Remaining: 1  Has the patient contacted their pharmacy (YES/NO)? NO What did pharmacy advise?   [facility] [address]   ~~~Please advise patient/caregiver to allow 2-3 business days to process RX refills.

## 2022-02-19 ENCOUNTER — Encounter: Payer: Medicare Other | Admitting: Internal Medicine

## 2022-03-06 ENCOUNTER — Telehealth: Payer: Self-pay | Admitting: *Deleted

## 2022-03-06 NOTE — Patient Outreach (Signed)
  Care Coordination   03/06/2022 Name: Cynthia Singh MRN: [id number] DOB: 07-26-1940   Care Coordination Outreach Attempts:  An unsuccessful telephone outreach was attempted today to offer the patient information about available care coordination services as a benefit of their health plan.   Follow Up Plan:  Additional outreach attempts will be made to offer the patient care coordination information and services.   Encounter Outcome:  No Answer  Care Coordination Interventions Activated:  Yes   Care Coordination Interventions:  No, not indicated    [facility] [phone]

## 2022-03-11 ENCOUNTER — Other Ambulatory Visit: Payer: Self-pay | Admitting: Internal Medicine

## 2022-03-11 ENCOUNTER — Telehealth: Payer: Self-pay | Admitting: Internal Medicine

## 2022-03-11 MED ORDER — OXYCODONE-ACETAMINOPHEN 10-325 MG PO TABS: 1.0000 | ORAL_TABLET | Freq: Four times a day (QID) | ORAL | 0 refills | Status: DC | PRN

## 2022-03-11 NOTE — Telephone Encounter (Signed)
Name of Medication: Oxycodone Name of [facility] Last Fill or Written Date and Quantity: 02-11-22 #150 Last Office Visit and Type: 10-28-21 Next Office Visit and Type: 03-24-22 Last Controlled Substance Agreement Date: 10/28/21 Last UDS:10/28/21

## 2022-03-11 NOTE — Telephone Encounter (Signed)
Called and spoke to pt to see if verified with [facility] that they have it in stock. She said she did not call them. I advised she will need to call and find out what pharmacy has her dose in stock. She may need to ask for the half dose as well just in case. She is aware Dr Silvio Pate is out of the office this afternoon so a change will not be sent until tomorrow if she finds a place today.

## 2022-03-11 NOTE — Telephone Encounter (Signed)
Pt called in to give name of pharmacy that has her RX oxyCODONE-acetaminophen (PERCOCET) 10-325 MG tablet   [place] , [employer]

## 2022-03-11 NOTE — Telephone Encounter (Signed)
  Encourage patient to contact the pharmacy for refills or they can request refills through [place]:  Please schedule appointment if longer than 1 year  NEXT APPOINTMENT DATE: oxyCODONE-acetaminophen (PERCOCET) 10-325 MG tablet  MEDICATION:  Is the patient out of medication?   [id number] [address]   Let patient know to contact pharmacy at the end of the day to make sure medication is ready.  Please notify patient to allow 48-72 hours to process  CLINICAL FILLS OUT ALL BELOW:   LAST REFILL:  QTY:  REFILL DATE:    OTHER COMMENTS:    Okay for refill?  Please advise

## 2022-03-11 NOTE — Addendum Note (Signed)
Addended by: Pilar Grammes on: 03/11/2022 03:42 PM   Modules accepted: Orders

## 2022-03-11 NOTE — Telephone Encounter (Signed)
Patient called in stating that  [id number] [place], [place] [address]:  [phone] Fax:  [phone]    doesn't have rxoxyCODONE-acetaminophen (PERCOCET) 10-325 MG tablet ,she would like it sent to  [id number] [place], [place]:  [phone]:  [phone]

## 2022-03-12 MED ORDER — OXYCODONE-ACETAMINOPHEN 10-325 MG PO TABS: 1.0000 | ORAL_TABLET | Freq: Four times a day (QID) | ORAL | 0 refills | Status: DC | PRN

## 2022-03-12 NOTE — Telephone Encounter (Signed)
Spoke to pt. She said [facility] told her last night they had enough, but then today they said they did not have enough. She said when [facility] called [facility] [address] [address] a few minutes ago, they had it.

## 2022-03-12 NOTE — Telephone Encounter (Signed)
Tried to call pt to let her know the rx was sent to [facility] but VM is full.   Called [facility] and left vm on Dr line to cancel the rx they have on file.

## 2022-03-12 NOTE — Addendum Note (Signed)
Addended by: [name] I on: 03/12/2022 01:49 PM   Modules accepted: Orders

## 2022-03-12 NOTE — Telephone Encounter (Signed)
Please cancel the other Rx

## 2022-03-12 NOTE — Addendum Note (Signed)
Addended by: Pilar Grammes on: 03/12/2022 01:40 PM   Modules accepted: Orders

## 2022-03-12 NOTE — Addendum Note (Signed)
Addended by: [name] I on: 03/12/2022 07:42 AM   Modules accepted: Orders

## 2022-03-12 NOTE — Telephone Encounter (Signed)
Patient called and said they have the medication [facility] [phone].

## 2022-03-24 ENCOUNTER — Encounter: Payer: Self-pay | Admitting: Internal Medicine

## 2022-03-24 ENCOUNTER — Ambulatory Visit (INDEPENDENT_AMBULATORY_CARE_PROVIDER_SITE_OTHER): Payer: Medicare Other | Admitting: Internal Medicine

## 2022-03-24 VITALS — BP 138/88 | HR 86 | Temp 97.7°F | Ht 67.0 in | Wt 198.0 lb

## 2022-03-24 DIAGNOSIS — Z23 Encounter for immunization: Secondary | ICD-10-CM | POA: Diagnosis not present

## 2022-03-24 DIAGNOSIS — F39 Unspecified mood [affective] disorder: Secondary | ICD-10-CM

## 2022-03-24 DIAGNOSIS — E1159 Type 2 diabetes mellitus with other circulatory complications: Secondary | ICD-10-CM

## 2022-03-24 DIAGNOSIS — F1120 Opioid dependence, uncomplicated: Secondary | ICD-10-CM

## 2022-03-24 DIAGNOSIS — I739 Peripheral vascular disease, unspecified: Secondary | ICD-10-CM

## 2022-03-24 DIAGNOSIS — M549 Dorsalgia, unspecified: Secondary | ICD-10-CM | POA: Diagnosis not present

## 2022-03-24 DIAGNOSIS — I25119 Atherosclerotic heart disease of native coronary artery with unspecified angina pectoris: Secondary | ICD-10-CM | POA: Diagnosis not present

## 2022-03-24 DIAGNOSIS — Z0000 Encounter for general adult medical examination without abnormal findings: Secondary | ICD-10-CM

## 2022-03-24 DIAGNOSIS — G8929 Other chronic pain: Secondary | ICD-10-CM | POA: Diagnosis not present

## 2022-03-24 DIAGNOSIS — E1165 Type 2 diabetes mellitus with hyperglycemia: Secondary | ICD-10-CM | POA: Diagnosis not present

## 2022-03-24 LAB — HM DIABETES FOOT EXAM

## 2022-03-24 LAB — HEPATIC FUNCTION PANEL
ALT: 8 U/L (ref 0–35)
AST: 10 U/L (ref 0–37)
Albumin: 4.4 g/dL (ref 3.5–5.2)
Alkaline Phosphatase: 72 U/L (ref 39–117)
Bilirubin, Direct: 0.1 mg/dL (ref 0.0–0.3)
Total Bilirubin: 0.4 mg/dL (ref 0.2–1.2)
Total Protein: 7.2 g/dL (ref 6.0–8.3)

## 2022-03-24 LAB — LIPID PANEL
Cholesterol: 140 mg/dL (ref 0–200)
HDL: 42.3 mg/dL (ref >39.00)
NonHDL: 97.7
Total CHOL/HDL Ratio: 3
Triglycerides: 229 mg/dL — ABNORMAL HIGH (ref 0.0–149.0)
VLDL: 45.8 mg/dL — ABNORMAL HIGH (ref 0.0–40.0)

## 2022-03-24 LAB — HEMOGLOBIN A1C: Hgb A1c MFr Bld: 8.2 % — ABNORMAL HIGH (ref 4.6–6.5)

## 2022-03-24 LAB — MICROALBUMIN / CREATININE URINE RATIO
Creatinine,U: 50.9 mg/dL
Microalb Creat Ratio: 3.7 mg/g (ref 0.0–30.0)
Microalb, Ur: 1.9 mg/dL (ref 0.0–1.9)

## 2022-03-24 LAB — CBC
HCT: 40.8 % (ref 36.0–46.0)
Hemoglobin: 13.2 g/dL (ref 12.0–15.0)
MCHC: 32.2 g/dL (ref 30.0–36.0)
MCV: 90.7 fl (ref 78.0–100.0)
Platelets: 243 10*3/uL (ref 150.0–400.0)
RBC: 4.5 Mil/uL (ref 3.87–5.11)
RDW: 14 % (ref 11.5–15.5)
WBC: 10.2 10*3/uL (ref 4.0–10.5)

## 2022-03-24 LAB — LDL CHOLESTEROL, DIRECT: Direct LDL: 75 mg/dL

## 2022-03-24 LAB — RENAL FUNCTION PANEL
Albumin: 4.4 g/dL (ref 3.5–5.2)
BUN: 13 mg/dL (ref 6–23)
CO2: 29 mEq/L (ref 19–32)
Calcium: 9.6 mg/dL (ref 8.4–10.5)
Chloride: 100 mEq/L (ref 96–112)
Creatinine, Ser: 0.86 mg/dL (ref 0.40–1.20)
GFR: 64.5 mL/min (ref >60.00)
Glucose, Bld: 142 mg/dL — ABNORMAL HIGH (ref 70–99)
Phosphorus: 3.2 mg/dL (ref 2.3–4.6)
Potassium: 4.2 mEq/L (ref 3.5–5.1)
Sodium: 137 mEq/L (ref 135–145)

## 2022-03-24 MED ORDER — LANTUS SOLOSTAR 100 UNIT/ML ~~LOC~~ SOPN: 40.0000 [IU] | PEN_INJECTOR | Freq: Every day | SUBCUTANEOUS | 0 refills | Status: DC

## 2022-03-24 MED ORDER — PEN NEEDLES 32G X 5 MM MISC: 1.0000 [IU] | Freq: Every day | 3 refills | Status: AC

## 2022-03-24 NOTE — Assessment & Plan Note (Signed)
PDMP reviewed No concerns 

## 2022-03-24 NOTE — Assessment & Plan Note (Signed)
I have personally reviewed the Medicare Annual Wellness questionnaire and have noted 1. The patient's medical and social history 2. Their use of alcohol, tobacco or illicit drugs 3. Their current medications and supplements 4. The patient's functional ability including ADL's, fall risks, home safety risks and hearing or visual             impairment. 5. Diet and physical activities 6. Evidence for depression or mood disorders  The patients weight, height, BMI and visual acuity have been recorded in the chart I have made referrals, counseling and provided education to the patient based review of the above and I have provided the pt with a written personalized care plan for preventive services.  I have provided you with a copy of your personalized plan for preventive services. Please take the time to review along with your updated medication list.  Done with cancer screening Not able to exercise--discussed resistance training for muscle strength Flu vaccine today Shingrix #2, Td, COVID vaccines at pharmacy

## 2022-03-24 NOTE — Progress Notes (Signed)
Subjective:    Patient ID: Cynthia Singh, female    DOB: July 03, 1940, 81 y.o.   MRN: [id number]  HPI Here for Medicare wellness visit and follow up of chronic health conditions Reviewed advanced directives Reviewed other doctors---Dr Bulakowski--opto No hospitalizations or Cynthia Not able to exercise Vision is about the same Hearing acceptable No alcohol or tobacco Several falls---no sig injuries Some depression --but not daily. Relates to pain, etc. Enjoys TV Son does her shopping. Son (adopted) lives with her--and does the heavy housework.  She does some cooking, but not daily.  Memory is okay  Having more trouble with her hands Swelling--ring won't come off--especially PIPs AM pain--does ease up  As buzzing in ears Hearing is okay No vertigo  Checks sugars --- usually low 100's fasting Over 200 at random times Intermittent burning/numbness in feet---not continuous  No chest pain  No SOB No palpitations Some dizziness walking--no syncope No edema  Some heartburn---uses alka seltzer prn Rare choking--no regular dysphagia  Back pain is about the same Same oxycodone  Current Outpatient Medications on File Prior to Visit  Medication Sig Dispense Refill   amLODipine (NORVASC) 2.5 MG tablet TAKE 1 TABLET BY MOUTH DAILY PT NEEDS TO MAKE APPT WITH PROVIDER TO GET FURTHER REFILLS- 1ST ATTEMPT 90 tablet 1   aspirin EC 81 MG tablet Take 81 mg by mouth daily.     atorvastatin (LIPITOR) 40 MG tablet Take 1 tablet (40 mg total) by mouth daily. 90 tablet 3   clopidogrel (PLAVIX) 75 MG tablet TAKE 1 TABLET BY MOUTH EVERY DAY 90 tablet 3   DULoxetine (CYMBALTA) 60 MG capsule Take 1 capsule (60 mg total) by mouth daily. 90 capsule 3   Insulin Pen Needle (PEN NEEDLES) 32G X 5 MM MISC 1 Units by Does not apply route daily. 100 each 3   LANTUS SOLOSTAR 100 UNIT/ML Solostar Pen TAKE 36 UNITS DAILY UNDER SKIN (Patient taking differently: 40 Units daily. TAKE 40 UNITS DAILY UNDER  SKIN) 15 mL 11   Menthol, Topical Analgesic, (ASPERCREME MAX ROLL-ON EX) Apply topically.     metFORMIN (GLUCOPHAGE-XR) 500 MG 24 hr tablet TAKE 2 TABLETS (1,000 MG TOTAL) BY MOUTH IN THE MORNING AND AT BEDTIME. 360 tablet 3   nitroGLYCERIN (NITROSTAT) 0.4 MG SL tablet Place 1 tablet (0.4 mg total) under the tongue every 5 (five) minutes as needed for chest pain. (Patient taking differently: Place 0.4 mg under the tongue daily. For arm/back pain) 30 tablet 0   nortriptyline (PAMELOR) 50 MG capsule TAKE 3 CAPSULES (150 MG TOTAL) BY MOUTH AT BEDTIME. 270 capsule 3   oxyCODONE-acetaminophen (PERCOCET) 10-325 MG tablet Take 1-2 tablets by mouth every 6 (six) hours as needed for pain. M54.9 150 tablet 0   polyethylene glycol (MIRALAX / GLYCOLAX) packet Take 17 g by mouth daily as needed.      pyridOXINE (VITAMIN B-6) 100 MG tablet Take 100 mg by mouth daily.     No current facility-administered medications on file prior to visit.    No Known Allergies  Past Medical History:  Diagnosis Date   CAD (coronary artery disease)    a. Remote nonobstructive disease but in 10/2012 Cath/PCI: s/p DES to RCA. b. cath 02/08/15 DES to prox LAD and balloon angioplasty of ost D1, EF 55-65%; c. 08/2019 PCI/DES prox/distal RCA (overlapping prev RCA stent). Prev placed RCA/LAD stents patent.   Concussion    SAH AFTER FALL 3/18   Depression    Diabetes mellitus, type 2 (  Cynthia Singh)    Dyslipidemia    Episodic mood disorder (Cynthia Singh)    Hypertension    Myocardial infarction (Cynthia Singh)    2014   Obesity    Osteoarthritis of spine    knees also   Peripheral vascular disease, unspecified (Cynthia Singh) 2020   severe disease on home screening by insurance    Past Surgical History:  Procedure Laterality Date   ABDOMINAL HYSTERECTOMY     [place] N/A 02/08/2015   Procedure: Left Heart Cath and Coronary Angiography;  Surgeon: Sherren Mocha, MD;  Location: [facility];  Service: Cardiovascular;   Laterality: N/A;   CARDIAC CATHETERIZATION N/A 02/08/2015   Procedure: Coronary Stent Intervention;  Surgeon: Sherren Mocha, MD;  Location: [facility];  Service: Cardiovascular;  Laterality: N/A;   CARDIAC CATHETERIZATION N/A 02/08/2015   Procedure: Intravascular Pressure Wire/FFR Study;  Surgeon: Sherren Mocha, MD;  Location: [facility];  Service: Cardiovascular;  Laterality: N/A;   CATARACT EXTRACTION W/PHACO Left 11/10/2016   Procedure: CATARACT EXTRACTION PHACO AND INTRAOCULAR LENS PLACEMENT ([facility]ynthia Singh) suture placed in left eye at end of procedure;  Surgeon: Birder Robson, MD;  Location: [facility];  Service: Ophthalmology;  Laterality: Left;  [place]  3.20 AP% 27.7 CDE 55.63 Fluid pack lot # [id number]   CHOLECYSTECTOMY     COMBINED HYSTERECTOMY ABDOMINAL W/ A&P REPAIR / OOPHORECTOMY  1968   CORONARY ANGIOPLASTY     STENTS X 5   CORONARY STENT INTERVENTION N/A 09/19/2019   Procedure: CORONARY STENT INTERVENTION;  Surgeon: Nelva Bush, MD;  Location: [facility];  Service: Cardiovascular;  Laterality: N/A;   CORONARY STENT PLACEMENT  2014   DILATION AND CURETTAGE OF UTERUS     GALLBLADDER Cynthia  1968   LEFT HEART CATH AND CORONARY ANGIOGRAPHY Left 09/19/2019   Procedure: LEFT HEART CATH AND CORONARY ANGIOGRAPHY;  Surgeon: Nelva Bush, MD;  Location: [facility];  Service: Cardiovascular;  Laterality: Left;   LEFT HEART CATHETERIZATION WITH CORONARY ANGIOGRAM N/A 10/20/2012   Procedure: LEFT HEART CATHETERIZATION WITH CORONARY ANGIOGRAM;  Surgeon: Sherren Mocha, MD;  Location: [facility];  Service: Cardiovascular;  Laterality: N/A;   multiple D&C     TONSILLECTOMY  age 69    Family History  Adopted: Yes  Problem Relation Age of Onset   Cancer Brother        Colon   Cancer Son    Coronary artery disease Neg Hx    Heart attack Neg Hx     Social History   Socioeconomic History   Marital status: Widowed    Spouse name: Not on file   Number of  children: 3   Years of education: 59   Highest education level: Not on file  Occupational History   Occupation: CASHIER  Tobacco Use   Smoking status: Never    Passive exposure: Past   Smokeless tobacco: Never  Vaping Use   Vaping Use: Never used  Substance and Sexual Activity   Alcohol use: No   Drug use: No   Sexual activity: Never    Birth control/protection: Post-menopausal  Other Topics Concern   Not on file  Social History Narrative   Widowed '13. 2 sons- '63, '66; one daughter-'69;    65 grandchildren--has raised 9 of her grandchildren 4 great grandchildren.   Lives with 2 granddaughters and adoptive son      Has living will   Probably would have granddaughter Desiree  Would accept resuscitation but no prolonged ventilation   Not sure about tube feeds   Social Determinants of Health   Financial Resource Strain: Medium Risk (06/24/2021)   Overall Financial Resource Strain (CARDIA)    Difficulty of Paying Living Expenses: Somewhat hard  Food Insecurity: Not on file  Transportation Needs: No Transportation Needs (06/24/2021)   PRAPARE - Transportation    Lack of Transportation (Medical): No    Lack of Transportation (Non-Medical): No  Physical Activity: Not on file  Stress: Not on file  Social Connections: Not on file  Intimate Partner Violence: Not on file   Review of Systems Appetite is fair Weight is about the same Sleep is variable Needs lower plate---will need dentist No suspicious skin lesions Bowels okay--no blood Voids okay    Objective:   Physical Exam Constitutional:      Appearance: Normal appearance.  HENT:     Mouth/Throat:     Comments: No lesions Eyes:     Comments: Strabismus with left eye drifted upwards  Cardiovascular:     Rate and Rhythm: Normal rate and regular rhythm.     Heart sounds: No murmur heard.    No gallop.     Comments: Faint pulses Pulmonary:     Effort: Pulmonary effort is normal.     Breath sounds: Normal breath  sounds. No wheezing or rales.  Abdominal:     Palpations: Abdomen is soft.     Tenderness: There is no abdominal tenderness.  Musculoskeletal:     Cervical back: Neck supple.     Right lower leg: No edema.     Left lower leg: No edema.     Comments: Nodules without inflammation in PIP>DIP in hands  Lymphadenopathy:     Cervical: No cervical adenopathy.  Skin:    Findings: No rash.     Comments: No foot lesions  Neurological:     Mental Status: She is alert and oriented to person, place, and time.     Comments: Mini-cog normal Very little sensation in plantar feet  Psychiatric:        Mood and Affect: Mood normal.        Behavior: Behavior normal.            Assessment & Plan:

## 2022-03-24 NOTE — Assessment & Plan Note (Signed)
No recent chest pain On atorvastatin 40, ASA 81, plavix,

## 2022-03-24 NOTE — Assessment & Plan Note (Signed)
No claudication but not really walking

## 2022-03-24 NOTE — Progress Notes (Signed)
Vision Screening   Right eye Left eye Both eyes  Without correction 20/40 20/200 20/30  With correction     Hearing Screening - Comments:: Did not pass whisper test

## 2022-03-24 NOTE — Assessment & Plan Note (Signed)
Dysthymia Reactive issues Is on duloxetine 60 and nortriptyline 150

## 2022-03-24 NOTE — Patient Instructions (Addendum)
Please try over the counter diclofenac gel for your hand pain.   You can get a tetanus booster, updated COVID and 2nd shingrix vaccines at your pharmacy

## 2022-03-24 NOTE — Assessment & Plan Note (Addendum)
Hopefully acceptable control --last 8.1 % On lantus 40, metformin 1000 bid Nortriptyline, duloxetine for the neuropathy

## 2022-03-24 NOTE — Assessment & Plan Note (Signed)
Stable on oxycodone

## 2022-04-08 ENCOUNTER — Other Ambulatory Visit: Payer: Self-pay | Admitting: Internal Medicine

## 2022-04-08 MED ORDER — OXYCODONE-ACETAMINOPHEN 10-325 MG PO TABS: 1.0000 | ORAL_TABLET | Freq: Four times a day (QID) | ORAL | 0 refills | Status: DC | PRN

## 2022-04-08 NOTE — Telephone Encounter (Signed)
  Encourage patient to contact the pharmacy for refills or they can request refills through [facility]:  Please schedule appointment if longer than 1 year  NEXT APPOINTMENT DATE:  MEDICATION:oxyCODONE-acetaminophen (PERCOCET) 10-325 MG tablet   Is the patient out of medication?   [id number]  Let patient know to contact pharmacy at the end of the day to make sure medication is ready.  Please notify patient to allow 48-72 hours to process  CLINICAL FILLS OUT ALL BELOW:   LAST REFILL:  QTY:  REFILL DATE:    OTHER COMMENTS:    Okay for refill?  Please advise

## 2022-04-08 NOTE — Telephone Encounter (Signed)
Name of Medication: Oxycodone Name of [facility]acy: [facility] Last Fill or Written Date and Quantity: 03-12-22 #150 Last Office Visit and Type: 03-24-22 Next Office Visit and Type: 06-24-22 Last Controlled Substance Agreement Date: 10/28/21 Last UDS:10/28/21

## 2022-04-08 NOTE — Telephone Encounter (Signed)
Called pt to let her know that Dr Silvio Pate is having computer issues logging in to the system (along with some in the office). He will keep trying to get on and will get her med top the pharmacy as quickly as he can.

## 2022-04-13 ENCOUNTER — Telehealth: Payer: Self-pay

## 2022-04-13 NOTE — Telephone Encounter (Signed)
[place] Night - Client TELEPHONE ADVICE RECORD [facility] Patient Name: Cynthia Singh Gender: Female DOB: Apr 11, 1941 Age: 81 Y 10 M 29 D Return Phone Number: [phone] (Primary) Address: City/ State/ Zip: [name]  [address] Client [facility] Night - Client Client Site [place] - Night Provider [name]- MD Contact Type Call Who Is Calling Patient / Member / Family / Caregiver Call Type Triage / Clinical Relationship To Patient Self Return Phone Number [phone] (Primary) Chief Complaint Back Pain - General Reason for Call Medication Question / Request Initial Comment Caller states her rx was called into wrong location and she is needing that called in. c/o Back pain Translation No Disp. Time Eilene Ghazi Time) Disposition Final User 04/10/2022 9:54:22 AM Attempt made - no message left Mancel Bale RN, Traci 04/10/2022 10:16:17 AM FINAL ATTEMPT MADE - no message left Yes Mancel Bale RN, Traci Final Disposition 04/10/2022 10:16:17 AM FINAL ATTEMPT MADE - no message left Yes Mancel Bale, RN, Traci Comments User: Corinna Lines, RN Date/Time Eilene Ghazi Time): 04/10/2022 9:54:11 AM RN unable to leave voice mail. Prompt states mailbox is full and cannot accept any messages

## 2022-04-13 NOTE — Telephone Encounter (Signed)
Spoke to pt. She was able to get it at [facility].

## 2022-04-22 ENCOUNTER — Telehealth: Payer: Self-pay

## 2022-04-22 NOTE — Telephone Encounter (Signed)
Tried to call pt to let her know her Lantus pt asst medication is here. It is in the fridge for her.

## 2022-05-07 ENCOUNTER — Other Ambulatory Visit: Payer: Self-pay | Admitting: Internal Medicine

## 2022-05-07 MED ORDER — OXYCODONE-ACETAMINOPHEN 10-325 MG PO TABS: 1.0000 | ORAL_TABLET | Freq: Four times a day (QID) | ORAL | 0 refills | Status: DC | PRN

## 2022-05-07 NOTE — Telephone Encounter (Signed)
Name of Medication: Oxycodone Name of Pharmacy: [facility] Last Fill or Written Date and Quantity: 04-08-22 #150 Last Office Visit and Type: 03-24-22 Next Office Visit and Type: 06-24-22 Last Controlled Substance Agreement Date: 10/28/21 Last UDS:10/28/21

## 2022-05-07 NOTE — Telephone Encounter (Signed)
  Encourage patient to contact the pharmacy for refills or they can request refills through [facility]  Did the patient contact the pharmacy: yes    LAST APPOINTMENT DATE: 03/24/22 NEXT APPOINTMENT DATE:  MEDICATION:oxyCODONE-acetaminophen (PERCOCET) 10-325 MG tablet   Is the patient out of medication? Yyes  If not, how much is left?none  Is this a 90 day supply: 150 (PATIENT SAID THAT THE PHARMACY ONLY HAVE 100,NOT 150)  [id number] [place] [address]: [phone] Fax: [phone]     Let patient know to contact pharmacy at the end of the day to make sure medication is ready.  Please notify patient to allow 48-72 hours to process

## 2022-06-05 ENCOUNTER — Other Ambulatory Visit: Payer: Self-pay | Admitting: Internal Medicine

## 2022-06-05 MED ORDER — OXYCODONE-ACETAMINOPHEN 10-325 MG PO TABS: 1.0000 | ORAL_TABLET | Freq: Four times a day (QID) | ORAL | 0 refills | Status: DC | PRN

## 2022-06-05 NOTE — Telephone Encounter (Signed)
Prescription Request  06/05/2022  Is this a "Controlled Substance" medicine? No  LOV: 03/24/2022  What is the name of the medication or equipment? oxyCODONE-acetaminophen (PERCOCET) 10-325 MG tablet   Have you contacted your pharmacy to request a refill? No   Which pharmacy would you like this sent to?  [id number] [place] [id number]: [phone]: [phone]     Patient notified that their request is being sent to the clinical staff for review and that they should receive a response within 2 business days.   Please advise at Mobile There is no such number on file (mobile).

## 2022-06-05 NOTE — Telephone Encounter (Signed)
Name of Medication: Oxycodone Name of [facility]acy[facility] Last Fill or Written Date and Quantity: 05-07-22 #150 Last Office Visit and Type: 03-24-22 Next Office Visit and Type: 06-24-22 Last Controlled Substance Agreement Date: 10/28/21 Last UDS:10/28/21

## 2022-06-11 ENCOUNTER — Other Ambulatory Visit: Payer: Self-pay | Admitting: Internal Medicine

## 2022-06-24 ENCOUNTER — Ambulatory Visit: Payer: Medicare Other | Admitting: Internal Medicine

## 2022-06-30 ENCOUNTER — Encounter: Payer: Self-pay | Admitting: Internal Medicine

## 2022-06-30 ENCOUNTER — Ambulatory Visit (INDEPENDENT_AMBULATORY_CARE_PROVIDER_SITE_OTHER): Payer: Medicare Other | Admitting: Internal Medicine

## 2022-06-30 VITALS — BP 138/88 | HR 94 | Temp 97.9°F | Ht 67.0 in | Wt 197.0 lb

## 2022-06-30 DIAGNOSIS — I25119 Atherosclerotic heart disease of native coronary artery with unspecified angina pectoris: Secondary | ICD-10-CM

## 2022-06-30 DIAGNOSIS — G8929 Other chronic pain: Secondary | ICD-10-CM | POA: Diagnosis not present

## 2022-06-30 DIAGNOSIS — F1120 Opioid dependence, uncomplicated: Secondary | ICD-10-CM

## 2022-06-30 DIAGNOSIS — E1159 Type 2 diabetes mellitus with other circulatory complications: Secondary | ICD-10-CM

## 2022-06-30 DIAGNOSIS — E1165 Type 2 diabetes mellitus with hyperglycemia: Secondary | ICD-10-CM

## 2022-06-30 DIAGNOSIS — M549 Dorsalgia, unspecified: Secondary | ICD-10-CM

## 2022-06-30 LAB — POCT GLYCOSYLATED HEMOGLOBIN (HGB A1C): Hemoglobin A1C: 9.3 % — AB (ref 4.0–5.6)

## 2022-06-30 MED ORDER — EMPAGLIFLOZIN 10 MG PO TABS: 10.0000 mg | ORAL_TABLET | Freq: Every day | ORAL | 11 refills | Status: DC

## 2022-06-30 NOTE — Assessment & Plan Note (Signed)
Lab Results  Component Value Date   HGBA1C 9.3 (A) 06/30/2022   Considerably worse  Discussed being careful with her eating On lantus 40 daily, metformin 1000 bid Will add jardiance 10 daily

## 2022-06-30 NOTE — Assessment & Plan Note (Signed)
Seems to have stable anginal pattern On atorvastatin 40, plavix Rare nitro Will add jardiance

## 2022-06-30 NOTE — Assessment & Plan Note (Signed)
Stable though decreased functional status on the oxycodone 10/325 five a day Also on duloxetine

## 2022-06-30 NOTE — Progress Notes (Signed)
Subjective:    Patient ID: Cynthia Singh, female    DOB: 09-Sep-1940, 82 y.o.   MRN: [id number]  HPI Here with granddaughter for follow up of chronic pain and diabetes  Doing okay Has some symptoms---feels like it could be menopausal Sweats in bed a couple of times---not sick  Consistently taking oxycodone--2 in AM, 2 at night, 1 at 3AM or so Pain is stable Family do most of the housework Gets groceries delivered [employer] a little for son and her--but he has ben bringing home food late at night (mostly healthy though) Some trouble getting dressed at times Doesn't shower--fell once. Just does sponge bath  Checks sugars only occasionally Mostly staying around 200--mostly fasting Neuropathy stable  Does get DOE--just walking in house Better with rest No chest pain Occasional dizziness upon arising--no syncope Has rollator--discussed using it  Current Outpatient Medications on File Prior to Visit  Medication Sig Dispense Refill   amLODipine (NORVASC) 2.5 MG tablet TAKE 1 TABLET BY MOUTH DAILY PT NEEDS TO MAKE APPT WITH PROVIDER TO GET FURTHER REFILLS- 1ST ATTEMPT 90 tablet 1   aspirin EC 81 MG tablet Take 81 mg by mouth daily.     atorvastatin (LIPITOR) 40 MG tablet Take 1 tablet (40 mg total) by mouth daily. 90 tablet 3   clopidogrel (PLAVIX) 75 MG tablet TAKE 1 TABLET BY MOUTH EVERY DAY 90 tablet 3   DULoxetine (CYMBALTA) 60 MG capsule Take 1 capsule (60 mg total) by mouth daily. 90 capsule 3   insulin glargine (LANTUS SOLOSTAR) 100 UNIT/ML Solostar Pen Inject 40 Units into the skin daily. TAKE 40 UNITS DAILY UNDER SKIN 1 mL 0   Insulin Pen Needle (PEN NEEDLES) 32G X 5 MM MISC 1 Units by Does not apply route daily. 100 each 3   Menthol, Topical Analgesic, (ASPERCREME MAX ROLL-ON EX) Apply topically.     metFORMIN (GLUCOPHAGE-XR) 500 MG 24 hr tablet TAKE 2 TABLETS (1,000 MG TOTAL) BY MOUTH IN THE MORNING AND AT BEDTIME. 360 tablet 3   nitroGLYCERIN (NITROSTAT) 0.4 MG SL tablet  Place 1 tablet (0.4 mg total) under the tongue every 5 (five) minutes as needed for chest pain. (Patient taking differently: Place 0.4 mg under the tongue daily. For arm/back pain) 30 tablet 0   nortriptyline (PAMELOR) 50 MG capsule TAKE 3 CAPSULES (150 MG TOTAL) BY MOUTH AT BEDTIME. 270 capsule 3   oxyCODONE-acetaminophen (PERCOCET) 10-325 MG tablet Take 1-2 tablets by mouth every 6 (six) hours as needed for pain. M54.9 150 tablet 0   polyethylene glycol (MIRALAX / GLYCOLAX) packet Take 17 g by mouth daily as needed.      No current facility-administered medications on file prior to visit.    No Known Allergies  Past Medical History:  Diagnosis Date   CAD (coronary artery disease)    a. Remote nonobstructive disease but in 10/2012 Cath/PCI: s/p DES to RCA. b. cath 02/08/15 DES to prox LAD and balloon angioplasty of ost D1, EF 55-65%; c. 08/2019 PCI/DES prox/distal RCA (overlapping prev RCA stent). Prev placed RCA/LAD stents patent.   Concussion    SAH AFTER FALL 3/18   Depression    Diabetes mellitus, type 2 ([place])    Dyslipidemia    Episodic mood disorder ([place])    Hypertension    Myocardial infarction ([place])    2014   Obesity    Osteoarthritis of spine    knees also   Peripheral vascular disease, unspecified ([place]) 2020   severe disease on  home screening by insurance    Past Surgical History:  Procedure Laterality Date   ABDOMINAL HYSTERECTOMY     APPENDECTOMY  1959   CARDIAC CATHETERIZATION N/A 02/08/2015   Procedure: Left Heart Cath and Coronary Angiography;  Surgeon: Sherren Mocha, MD;  Location: [facility];  Service: Cardiovascular;  Laterality: N/A;   CARDIAC CATHETERIZATION N/A 02/08/2015   Procedure: Coronary Stent Intervention;  Surgeon: Sherren Mocha, MD;  Location: [facility];  Service: Cardiovascular;  Laterality: N/A;   CARDIAC CATHETERIZATION N/A 02/08/2015   Procedure: Intravascular Pressure Wire/FFR Study;  Surgeon: Sherren Mocha, MD;  Location: [place] [facility];  Service: Cardiovascular;  Laterality: N/A;   CATARACT EXTRACTION W/PHACO Left 11/10/2016   Procedure: CATARACT EXTRACTION PHACO AND INTRAOCULAR LENS PLACEMENT ([place]) suture placed in left eye at end of procedure;  Surgeon: Birder Robson, MD;  Location: [facility];  Service: Ophthalmology;  Laterality: Left;  [place]  3.20 AP% 27.7 CDE 55.63 Fluid pack lot # [id number]   CHOLECYSTECTOMY     COMBINED HYSTERECTOMY ABDOMINAL W/ A&P REPAIR / OOPHORECTOMY  1968   CORONARY ANGIOPLASTY     STENTS X 5   CORONARY STENT INTERVENTION N/A 09/19/2019   Procedure: CORONARY STENT INTERVENTION;  Surgeon: Nelva Bush, MD;  Location: [place] [facility];  Service: Cardiovascular;  Laterality: N/A;   CORONARY STENT PLACEMENT  2014   DILATION AND CURETTAGE OF UTERUS     GALLBLADDER SURGERY  1968   LEFT HEART CATH AND CORONARY ANGIOGRAPHY Left 09/19/2019   Procedure: LEFT HEART CATH AND CORONARY ANGIOGRAPHY;  Surgeon: Nelva Bush, MD;  Location: [place] [facility];  Service: Cardiovascular;  Laterality: Left;   LEFT HEART CATHETERIZATION WITH CORONARY ANGIOGRAM N/A 10/20/2012   Procedure: LEFT HEART CATHETERIZATION WITH CORONARY ANGIOGRAM;  Surgeon: Sherren Mocha, MD;  Location: [facility];  Service: Cardiovascular;  Laterality: N/A;   multiple D&C     TONSILLECTOMY  age 60    Family History  Adopted: Yes  Problem Relation Age of Onset   Cancer Brother        Colon   Cancer Son    Coronary artery disease Neg Hx    Heart attack Neg Hx     Social History   Socioeconomic History   Marital status: Widowed    Spouse name: Not on file   Number of children: 3   Years of education: 40   Highest education level: Not on file  Occupational History   Occupation: CASHIER  Tobacco Use   Smoking status: Never    Passive exposure: Past   Smokeless tobacco: Never  Vaping Use   Vaping Use: Never used  Substance and Sexual Activity   Alcohol use: No   Drug use: No   Sexual activity:  Never    Birth control/protection: Post-menopausal  Other Topics Concern   Not on file  Social History Narrative   Widowed '13. 2 sons- '63, '66; one daughter-'69;    39 grandchildren--has raised 9 of her grandchildren 4 great grandchildren.   Lives with 2 granddaughters and adoptive son      Has living will   Probably would have granddaughter York Cerise   Would accept resuscitation but no prolonged ventilation   Not sure about tube feeds   Social Determinants of Health   Financial Resource Strain: Medium Risk (06/24/2021)   Overall Financial Resource Strain (CARDIA)    Difficulty of Paying Living Expenses: Somewhat hard  Food Insecurity: Not on file  Transportation  Needs: No Transportation Needs (06/24/2021)   PRAPARE - Hydrologist (Medical): No    Lack of Transportation (Non-Medical): No  Physical Activity: Not on file  Stress: Not on file  Social Connections: Not on file  Intimate Partner Violence: Not on file   Review of Systems Sleeps fair---some nights has trouble initiating (still on nortriptyline) Gets up 8-10 AM--discussed sleep contraction    Objective:   Physical Exam Constitutional:      Appearance: Normal appearance.  Cardiovascular:     Rate and Rhythm: Normal rate and regular rhythm.     Heart sounds: No murmur heard.    No gallop.  Pulmonary:     Effort: Pulmonary effort is normal.     Breath sounds: Normal breath sounds. No wheezing or rales.  Musculoskeletal:     Cervical back: Neck supple.     Right lower leg: No edema.     Left lower leg: No edema.  Lymphadenopathy:     Cervical: No cervical adenopathy.  Neurological:     Mental Status: She is alert.  Psychiatric:        Mood and Affect: Mood normal.        Behavior: Behavior normal.            Assessment & Plan:

## 2022-06-30 NOTE — Assessment & Plan Note (Signed)
PDMP reviewed No concerns 

## 2022-07-03 ENCOUNTER — Other Ambulatory Visit: Payer: Self-pay | Admitting: Internal Medicine

## 2022-07-03 DIAGNOSIS — E785 Hyperlipidemia, unspecified: Secondary | ICD-10-CM

## 2022-07-03 MED ORDER — OXYCODONE-ACETAMINOPHEN 10-325 MG PO TABS: 1.0000 | ORAL_TABLET | Freq: Four times a day (QID) | ORAL | 0 refills | Status: DC | PRN

## 2022-07-03 NOTE — Telephone Encounter (Signed)
Name of Medication: Oxycodone Name of [facility] or Written Date and Quantity: 06-05-22 #150 Last Office Visit and Type: 06-30-22 Next Office Visit and Type: 09-28-22 Last Controlled Substance Agreement Date: 10/28/21 Last UDS:10/28/21

## 2022-07-03 NOTE — Telephone Encounter (Signed)
Prescription Request  07/03/2022  Is this a "Controlled Substance" medicine? Yes  LOV: 06/30/2022  What is the name of the medication or equipment?   oxyCODONE-acetaminophen (PERCOCET) 10-325 MG tablet  Have you contacted your pharmacy to request a refill? No   Which pharmacy would you like this sent to?   [id number] [place] [place] [id number]: [phone]: [phone]     Patient notified that their request is being sent to the clinical staff for review and that they should receive a response within 2 business days.   [phone]

## 2022-07-04 ENCOUNTER — Other Ambulatory Visit: Payer: Self-pay | Admitting: Internal Medicine

## 2022-07-31 ENCOUNTER — Other Ambulatory Visit: Payer: Self-pay | Admitting: Internal Medicine

## 2022-07-31 MED ORDER — OXYCODONE-ACETAMINOPHEN 10-325 MG PO TABS: 1.0000 | ORAL_TABLET | Freq: Four times a day (QID) | ORAL | 0 refills | Status: DC | PRN

## 2022-07-31 NOTE — Telephone Encounter (Signed)
Name of Medication: Oxycodone Name of [facility] or Written Date and Quantity: 07-03-22 #150 Last Office Visit and Type: 06-30-22 Next Office Visit and Type: 09-28-22 Last Controlled Substance Agreement Date: 10/28/21 Last UDS:10/28/21

## 2022-07-31 NOTE — Telephone Encounter (Signed)
Prescription Request  07/31/2022  LOV: 06/30/2022  What is the name of the medication or equipment? oxyCODONE-acetaminophen (PERCOCET) 10-325 MG tablet   Have you contacted your pharmacy to request a refill? No   Which pharmacy would you like this sent to?  [id number] [place], [place] - [address] [address] Phone: [phone] Fax: [phone]    Patient notified that their request is being sent to the clinical staff for review and that they should receive a response within 2 business days.   Please advise at [facility] [phone]

## 2022-08-01 ENCOUNTER — Other Ambulatory Visit: Payer: Self-pay | Admitting: Internal Medicine

## 2022-09-02 ENCOUNTER — Other Ambulatory Visit: Payer: Self-pay | Admitting: Internal Medicine

## 2022-09-02 MED ORDER — OXYCODONE-ACETAMINOPHEN 10-325 MG PO TABS: 1.0000 | ORAL_TABLET | Freq: Four times a day (QID) | ORAL | 0 refills | Status: DC | PRN

## 2022-09-02 NOTE — Telephone Encounter (Signed)
Name of Medication: Oxycodone [facility] Last Fill or Written Date and Quantity: 07-31-22 #150 Last Office Visit and Type: 06-30-22 Next Office Visit and Type: 09-28-22 Last Controlled Substance Agreement Date: 10/28/21 Last UDS:10/28/21

## 2022-09-02 NOTE — Telephone Encounter (Signed)
Prescription Request  09/02/2022  LOV: 06/30/2022  What is the name of the medication or equipment?  oxyCODONE-acetaminophen (PERCOCET) 10-325 MG tablet   Have you contacted your pharmacy to request a refill? No   Which pharmacy would you like this sent to?  [facility] [address] [place] - [address] [place] [address] Phone: [phone] Fax: [phone]   Patient notified that their request is being sent to the clinical staff for review and that they should receive a response within 2 business days.   Please advise at [facility] [phone]

## 2022-09-28 ENCOUNTER — Ambulatory Visit: Payer: Medicare HMO | Admitting: Internal Medicine

## 2022-09-30 ENCOUNTER — Other Ambulatory Visit: Payer: Self-pay | Admitting: Internal Medicine

## 2022-09-30 NOTE — Telephone Encounter (Signed)
Name of Medication: Oxycodone Name of [facility] Last Fill or Written Date and Quantity: 09/02/22 #150 Last Office Visit and Type: 06/30/22 Next Office Visit and Type: 10/05/22 Last Controlled Substance Agreement Date: 10/28/21 Last UDS: 10/28/21

## 2022-09-30 NOTE — Telephone Encounter (Signed)
Prescription Request  09/30/2022  LOV: 06/30/2022  What is the name of the medication or equipment? oxyCODONE-acetaminophen (PERCOCET) 10-325 MG tablet   Have you contacted your pharmacy to request a refill? No   Which pharmacy would you like this sent to?  [facility] [address] [place] - [address] [place] [address] Phone: [phone] Fax: [phone]      Patient notified that their request is being sent to the clinical staff for review and that they should receive a response within 2 business days.   Please advise at [facility] [phone]

## 2022-10-01 ENCOUNTER — Telehealth: Payer: Self-pay | Admitting: Internal Medicine

## 2022-10-01 MED ORDER — OXYCODONE-ACETAMINOPHEN 10-325 MG PO TABS: 1.0000 | ORAL_TABLET | Freq: Four times a day (QID) | ORAL | 0 refills | Status: DC | PRN

## 2022-10-01 NOTE — Telephone Encounter (Signed)
Patient is needing prior auth from her new insurance company [employer] in order for pharmacy to fill medication oxyCODONE-acetaminophen (PERCOCET) 10-325 MG tablet  for her.

## 2022-10-02 ENCOUNTER — Other Ambulatory Visit (HOSPITAL_COMMUNITY): Payer: Self-pay

## 2022-10-02 ENCOUNTER — Telehealth: Payer: Self-pay | Admitting: Internal Medicine

## 2022-10-02 NOTE — Telephone Encounter (Signed)
PA was approved. Effective dates: 05/18/22-05/18/23. Ran test claim, prescription had been filled.

## 2022-10-02 NOTE — Telephone Encounter (Signed)
Patient contacted the office regarding medication oxyCODONE-acetaminophen (PERCOCET) 10-325 MG tablet , states insurance company needs authorization for this medication to be refilled, patient states she is out of this medication. Please advise, thank you.

## 2022-10-05 ENCOUNTER — Encounter: Payer: Self-pay | Admitting: Internal Medicine

## 2022-10-05 ENCOUNTER — Ambulatory Visit (INDEPENDENT_AMBULATORY_CARE_PROVIDER_SITE_OTHER): Payer: Medicare HMO | Admitting: Internal Medicine

## 2022-10-05 VITALS — BP 124/80 | HR 94 | Temp 97.5°F | Ht 67.0 in | Wt 192.0 lb

## 2022-10-05 DIAGNOSIS — F1120 Opioid dependence, uncomplicated: Secondary | ICD-10-CM

## 2022-10-05 DIAGNOSIS — G8929 Other chronic pain: Secondary | ICD-10-CM

## 2022-10-05 DIAGNOSIS — Z794 Long term (current) use of insulin: Secondary | ICD-10-CM | POA: Diagnosis not present

## 2022-10-05 DIAGNOSIS — M549 Dorsalgia, unspecified: Secondary | ICD-10-CM

## 2022-10-05 DIAGNOSIS — E1159 Type 2 diabetes mellitus with other circulatory complications: Secondary | ICD-10-CM | POA: Diagnosis not present

## 2022-10-05 DIAGNOSIS — F39 Unspecified mood [affective] disorder: Secondary | ICD-10-CM

## 2022-10-05 DIAGNOSIS — Z7984 Long term (current) use of oral hypoglycemic drugs: Secondary | ICD-10-CM | POA: Diagnosis not present

## 2022-10-05 DIAGNOSIS — E1165 Type 2 diabetes mellitus with hyperglycemia: Secondary | ICD-10-CM | POA: Diagnosis not present

## 2022-10-05 LAB — POCT GLYCOSYLATED HEMOGLOBIN (HGB A1C): Hemoglobin A1C: 9 % — AB (ref 4.0–5.6)

## 2022-10-05 MED ORDER — EMPAGLIFLOZIN 25 MG PO TABS: 25.0000 mg | ORAL_TABLET | Freq: Every day | ORAL | 3 refills | Status: DC

## 2022-10-05 NOTE — Telephone Encounter (Signed)
See previous message with med approval

## 2022-10-05 NOTE — Assessment & Plan Note (Signed)
A1c down slightly to 9% No sig problems with jardiance---will increase to 25mg  Lantus 40, metformin 1000 bid as well

## 2022-10-05 NOTE — Addendum Note (Signed)
Addended by: Eual Fines on: 10/05/2022 03:42 PM   Modules accepted: Orders

## 2022-10-05 NOTE — Assessment & Plan Note (Signed)
Ongoing severe back pain Restrictions in activity despite 5 of the oxycodone/apap 10/325 daily No changes

## 2022-10-05 NOTE — Progress Notes (Signed)
Subjective:    Patient ID: Cynthia Singh, female    DOB: 1941-03-02, 82 y.o.   MRN: [id number]  HPI Here for follow up of chronic pain and poorly controlled diabetes  Chronic pain is about the same Maintains reasonable function with the oxycodone Son does the shopping  Doesn't cook much---but does do the dishes Gets tired just walking around the house Son does the laundry  Has made some adjustments to eating Trying to watch sweets Does have bread---discussed keto bread Glucometer broke--had been checking before that--- some swimmy headedness. Had been getting in high 100's Is voiding more---no vaginal yeast problems  Current Outpatient Medications on File Prior to Visit  Medication Sig Dispense Refill   amLODipine (NORVASC) 2.5 MG tablet TAKE 1 TABLET BY MOUTH DAILY PT NEEDS TO MAKE APPT WITH PROVIDER TO GET FURTHER REFILLS- 1ST ATTEMPT 90 tablet 1   aspirin EC 81 MG tablet Take 81 mg by mouth daily.     atorvastatin (LIPITOR) 40 MG tablet TAKE 1 TABLET BY MOUTH EVERY DAY 90 tablet 3   clopidogrel (PLAVIX) 75 MG tablet TAKE 1 TABLET BY MOUTH EVERY DAY 90 tablet 3   DULoxetine (CYMBALTA) 30 MG capsule TAKE 1 CAPSULE BY MOUTH EVERY DAY 90 capsule 2   DULoxetine (CYMBALTA) 60 MG capsule Take 1 capsule (60 mg total) by mouth daily. 90 capsule 3   empagliflozin (JARDIANCE) 10 MG TABS tablet Take 1 tablet (10 mg total) by mouth daily before breakfast. 30 tablet 11   insulin glargine (LANTUS SOLOSTAR) 100 UNIT/ML Solostar Pen Inject 40 Units into the skin daily. TAKE 40 UNITS DAILY UNDER SKIN 1 mL 0   Insulin Pen Needle (PEN NEEDLES) 32G X 5 MM MISC 1 Units by Does not apply route daily. 100 each 3   metFORMIN (GLUCOPHAGE-XR) 500 MG 24 hr tablet TAKE 2 TABLETS (1,000 MG TOTAL) BY MOUTH IN THE MORNING AND AT BEDTIME. 360 tablet 3   nitroGLYCERIN (NITROSTAT) 0.4 MG SL tablet Place 1 tablet (0.4 mg total) under the tongue every 5 (five) minutes as needed for chest pain. (Patient taking  differently: Place 0.4 mg under the tongue daily. For arm/back pain) 30 tablet 0   nortriptyline (PAMELOR) 50 MG capsule TAKE 3 CAPSULES (150 MG TOTAL) BY MOUTH AT BEDTIME. 270 capsule 3   oxyCODONE-acetaminophen (PERCOCET) 10-325 MG tablet Take 1-2 tablets by mouth every 6 (six) hours as needed for pain. M54.9 150 tablet 0   polyethylene glycol (MIRALAX / GLYCOLAX) packet Take 17 g by mouth daily as needed.      No current facility-administered medications on file prior to visit.    No Known Allergies  Past Medical History:  Diagnosis Date   CAD (coronary artery disease)    a. Remote nonobstructive disease but in 10/2012 Cath/PCI: s/p DES to RCA. b. cath 02/08/15 DES to prox LAD and balloon angioplasty of ost D1, EF 55-65%; c. 08/2019 PCI/DES prox/distal RCA (overlapping prev RCA stent). Prev placed RCA/LAD stents patent.   Concussion    SAH AFTER FALL 3/18   Depression    Diabetes mellitus, type 2 (HCC)    Dyslipidemia    Episodic mood disorder (HCC)    Hypertension    Myocardial infarction (HCC)    2014   Obesity    Osteoarthritis of spine    knees also   Peripheral vascular disease, unspecified (HCC) 2020   severe disease on home screening by insurance    Past Surgical History:  Procedure Laterality Date  ABDOMINAL HYSTERECTOMY     APPENDECTOMY  1959   CARDIAC CATHETERIZATION N/A 02/08/2015   Procedure: Left Heart Cath and Coronary Angiography;  Surgeon: Tonny Bollman, MD;  Location: [facility] INVASIVE CV LAB;  Service: Cardiovascular;  Laterality: N/A;   CARDIAC CATHETERIZATION N/A 02/08/2015   Procedure: Coronary Stent Intervention;  Surgeon: Tonny Bollman, MD;  Location: [facility] INVASIVE CV LAB;  Service: Cardiovascular;  Laterality: N/A;   CARDIAC CATHETERIZATION N/A 02/08/2015   Procedure: Intravascular Pressure Wire/FFR Study;  Surgeon: Tonny Bollman, MD;  Location: [facility] INVASIVE CV LAB;  Service: Cardiovascular;  Laterality: N/A;   CATARACT EXTRACTION W/PHACO Left 11/10/2016    Procedure: CATARACT EXTRACTION PHACO AND INTRAOCULAR LENS PLACEMENT (IOC) suture placed in left eye at end of procedure;  Surgeon: Galen Manila, MD;  Location: [facility] ORS;  Service: Ophthalmology;  Laterality: Left;  [place]  3.20 AP% 27.7 CDE 55.63 Fluid pack lot # [id number]   CHOLECYSTECTOMY     COMBINED HYSTERECTOMY ABDOMINAL W/ A&P REPAIR / OOPHORECTOMY  1968   CORONARY ANGIOPLASTY     STENTS X 5   CORONARY STENT INTERVENTION N/A 09/19/2019   Procedure: CORONARY STENT INTERVENTION;  Surgeon: Yvonne Kendall, MD;  Location: [facility] INVASIVE CV LAB;  Service: Cardiovascular;  Laterality: N/A;   CORONARY STENT PLACEMENT  2014   DILATION AND CURETTAGE OF UTERUS     GALLBLADDER SURGERY  1968   LEFT HEART CATH AND CORONARY ANGIOGRAPHY Left 09/19/2019   Procedure: LEFT HEART CATH AND CORONARY ANGIOGRAPHY;  Surgeon: Yvonne Kendall, MD;  Location: [facility] INVASIVE CV LAB;  Service: Cardiovascular;  Laterality: Left;   LEFT HEART CATHETERIZATION WITH CORONARY ANGIOGRAM N/A 10/20/2012   Procedure: LEFT HEART CATHETERIZATION WITH CORONARY ANGIOGRAM;  Surgeon: Tonny Bollman, MD;  Location: [facility] CATH LAB;  Service: Cardiovascular;  Laterality: N/A;   multiple D&C     TONSILLECTOMY  age 64    Family History  Adopted: Yes  Problem Relation Age of Onset   Cancer Brother        Colon   Cancer Son    Coronary artery disease Neg Hx    Heart attack Neg Hx     Social History   Socioeconomic History   Marital status: Widowed    Spouse name: Not on file   Number of children: 3   Years of education: 21   Highest education level: Not on file  Occupational History   Occupation: CASHIER  Tobacco Use   Smoking status: Never    Passive exposure: Past   Smokeless tobacco: Never  Vaping Use   Vaping Use: Never used  Substance and Sexual Activity   Alcohol use: No   Drug use: No   Sexual activity: Never    Birth control/protection: Post-menopausal  Other Topics Concern   Not on file  Social History  Narrative   Widowed '13. 2 sons- '63, '66; one daughter-'69;    11 grandchildren--has raised 9 of her grandchildren 4 great grandchildren.   Lives with 2 granddaughters and adoptive son      Has living will   Probably would have granddaughter Cristie Hem   Would accept resuscitation but no prolonged ventilation   Not sure about tube feeds   Social Determinants of Health   Financial Resource Strain: Medium Risk (06/24/2021)   Overall Financial Resource Strain (CARDIA)    Difficulty of Paying Living Expenses: Somewhat hard  Food Insecurity: Not on file  Transportation Needs: No Transportation Needs (06/24/2021)   PRAPARE - Transportation    Lack of Transportation (  Medical): No    Lack of Transportation (Non-Medical): No  Physical Activity: Not on file  Stress: Not on file  Social Connections: Not on file  Intimate Partner Violence: Not on file   Review of Systems Variable sleep---pain and general sleep issues---doesn't feel the nortriptyline that effects Weight is stable    Objective:   Physical Exam Constitutional:      Appearance: Normal appearance.  Cardiovascular:     Rate and Rhythm: Normal rate and regular rhythm.     Heart sounds: No murmur heard.    No gallop.     Comments: 1+ pedal pulses Pulmonary:     Effort: Pulmonary effort is normal.     Breath sounds: Normal breath sounds. No wheezing or rales.  Skin:    Comments: No foot lesions  Neurological:     Mental Status: She is alert.  Psychiatric:     Comments: Not overtly depressed but ?melacholic            Assessment & Plan:

## 2022-10-05 NOTE — Assessment & Plan Note (Signed)
Ongoing issues with chronic pain, etc On duloxetine 90mg  daily and nortriptyline 150 bedtime (both for pain also)

## 2022-10-05 NOTE — Assessment & Plan Note (Signed)
PDMP reviewed No concerns 

## 2022-10-09 ENCOUNTER — Telehealth: Payer: Self-pay | Admitting: Internal Medicine

## 2022-10-09 NOTE — Telephone Encounter (Signed)
Jaclynn Guarneri from [place] called to confirm the chronic condition of patient,so that they can make sure she is eligible for her insurance policy.

## 2022-10-13 NOTE — Telephone Encounter (Signed)
I received a fax asking for the same information and it suggests the pt sign it giving [place] authorization to disclose this information.  Called and spoke to pt. She said that she advised them of her Diabetes and her CAD. She was okay with me documenting those 2 things. She authorized me to note that on the fax and send it back.  Form placed in Dr Karle Starch inbox on his desk.

## 2022-10-14 NOTE — Telephone Encounter (Signed)
Form faxed to [employer]

## 2022-10-30 ENCOUNTER — Other Ambulatory Visit: Payer: Self-pay | Admitting: Internal Medicine

## 2022-10-30 MED ORDER — OXYCODONE-ACETAMINOPHEN 10-325 MG PO TABS: 1.0000 | ORAL_TABLET | Freq: Four times a day (QID) | ORAL | 0 refills | Status: DC | PRN

## 2022-10-30 NOTE — Telephone Encounter (Signed)
Prescription Request  10/30/2022  LOV: 10/05/2022  What is the name of the medication or equipment? oxyCODONE-acetaminophen (PERCOCET) 10-325 MG tablet   Have you contacted your pharmacy to request a refill? No   Which pharmacy would you like this sent to?  [facility] [address] [place] - [address] [place] [address] Phone: [phone] Fax: [phone]   Patient notified that their request is being sent to the clinical staff for review and that they should receive a response within 2 business days.   Please advise at [facility] [phone]

## 2022-10-30 NOTE — Telephone Encounter (Signed)
Name of Medication: Oxycodone [facility] of [facility] Last Fill or Written Date and Quantity: 10/01/22 #150 Last Office Visit and Type: 10/05/22 Next Office Visit and Type: 01/05/23 Last Controlled Substance Agreement Date: 10/28/21 Last UDS: 10/28/21

## 2022-11-12 ENCOUNTER — Telehealth: Payer: Self-pay | Admitting: Internal Medicine

## 2022-11-12 ENCOUNTER — Other Ambulatory Visit: Payer: Self-pay | Admitting: Internal Medicine

## 2022-11-12 NOTE — Telephone Encounter (Signed)
Prescription Request  11/12/2022  LOV: Visit date not found  What is the name of the medication or equipment? amLODipine (NORVASC) 2.5 MG tablet   Have you contacted your pharmacy to request a refill? No   Which pharmacy would you like this sent to?  [facility] [address] [place] - [address] [place] [address] Phone: [phone] Fax: [phone]     Patient notified that their request is being sent to the clinical staff for review and that they should receive a response within 2 business days.   Please advise at Mobile [phone]

## 2022-11-12 NOTE — Telephone Encounter (Signed)
Pt called in requesting a refill on her BP meds. Pt stated her daughter took is BP & the top number was "188", pt couldn't remember the bottom number & her daughter was no longer around her to remind her. Pt stated she is worried about her vitals & that's why she was requested a refill. Transferred pt to access nurse. Call back # [phone]

## 2022-11-12 NOTE — Telephone Encounter (Signed)
Dr End writes amlodipine for her. There is already a request for refill sent to him today.

## 2022-11-13 MED ORDER — AMLODIPINE BESYLATE 2.5 MG PO TABS: 2.5000 mg | ORAL_TABLET | Freq: Every day | ORAL | 0 refills | Status: DC

## 2022-11-13 NOTE — Telephone Encounter (Signed)
I sent amlodipine rx with instructions for her to call clinic re: eval.  Thanks. Routed to PCP as FYI.

## 2022-11-13 NOTE — Telephone Encounter (Signed)
Unable to reach pt by phone and mailbox is full; [employer] number is a business. Sending note to [facility] triage and Dr Ermalene Searing who is in office.

## 2022-11-13 NOTE — Telephone Encounter (Signed)
Unable to reach pt by phone and no answer and mailbox is full. I have tried all day to reach someone with no success. Sending note to Dr Para March who is in office and [place] pool.

## 2022-11-13 NOTE — Addendum Note (Signed)
Addended by: Joaquim Nam on: 11/13/2022 04:53 PM   Modules accepted: Orders

## 2022-11-26 ENCOUNTER — Other Ambulatory Visit: Payer: Self-pay | Admitting: Internal Medicine

## 2022-11-26 MED ORDER — OXYCODONE-ACETAMINOPHEN 10-325 MG PO TABS: 1.0000 | ORAL_TABLET | Freq: Four times a day (QID) | ORAL | 0 refills | Status: DC | PRN

## 2022-11-26 NOTE — Addendum Note (Signed)
Addended by: Tillman Abide I on: 11/26/2022 01:45 PM   Modules accepted: Orders

## 2022-11-26 NOTE — Addendum Note (Signed)
Addended by: Eual Fines on: 11/26/2022 09:58 AM   Modules accepted: Orders

## 2022-11-26 NOTE — Telephone Encounter (Signed)
Prescription Request  11/26/2022  LOV: 10/05/2022  What is the name of the medication or equipment? oxyCODONE-acetaminophen (PERCOCET) 10-325 MG tablet   Have you contacted your pharmacy to request a refill? No   Which pharmacy would you like this sent to?  [facility] [address] [place] - [address] [place] [address] Phone: [phone] Fax: [phone]     Patient notified that their request is being sent to the clinical staff for review and that they should receive a response within 2 business days.   Please advise at [facility] [phone]

## 2022-11-26 NOTE — Telephone Encounter (Signed)
Name of Medication: Oxycodone Name of [facility] Last Fill or Written Date and Quantity: 10/01/22 #150 Last Office Visit and Type: 10/05/22 Next Office Visit and Type: 01/05/23 Last Controlled Substance Agreement Date: 10/28/21 Last UDS: 10/28/21   

## 2022-12-08 ENCOUNTER — Other Ambulatory Visit: Payer: Self-pay | Admitting: Family Medicine

## 2022-12-25 ENCOUNTER — Other Ambulatory Visit: Payer: Self-pay | Admitting: Internal Medicine

## 2022-12-25 MED ORDER — OXYCODONE-ACETAMINOPHEN 10-325 MG PO TABS: 1.0000 | ORAL_TABLET | Freq: Four times a day (QID) | ORAL | 0 refills | Status: DC | PRN

## 2022-12-25 NOTE — Telephone Encounter (Signed)
Name of Medication: Oxycodone [facility] Last Fill or Written Date and Quantity: 11/26/22 #150 Last Office Visit and Type: 10/05/22 Next Office Visit and Type: 01/05/23 Last Controlled Substance Agreement Date: 10/28/21 Last UDS: 10/28/21

## 2022-12-25 NOTE — Telephone Encounter (Signed)
Prescription Request  12/25/2022  LOV: 10/05/2022  What is the name of the medication or equipment? oxyCODONE-acetaminophen (PERCOCET) 10-325 MG tablet   Have you contacted your pharmacy to request a refill? No   Which pharmacy would you like this sent to?  [facility] [address], [place] - [address] [place] [address] Phone: [phone] Fax: [phone]    Patient notified that their request is being sent to the clinical staff for review and that they should receive a response within 2 business days.   Please advise at [facility] [phone]

## 2022-12-28 ENCOUNTER — Other Ambulatory Visit: Payer: Self-pay | Admitting: Internal Medicine

## 2022-12-28 MED ORDER — OXYCODONE-ACETAMINOPHEN 10-325 MG PO TABS: 1.0000 | ORAL_TABLET | Freq: Four times a day (QID) | ORAL | 0 refills | Status: DC | PRN

## 2022-12-28 NOTE — Addendum Note (Signed)
Addended by: Eual Fines on: 12/28/2022 10:52 AM   Modules accepted: Orders

## 2022-12-28 NOTE — Addendum Note (Signed)
Addended by: Tillman Abide I on: 12/28/2022 12:55 PM   Modules accepted: Orders

## 2022-12-28 NOTE — Telephone Encounter (Signed)
Pt called stating she was told by her pharmacy that Dr. Alphonsus Sias sent in a rx for oxyCODONE-acetaminophen (PERCOCET) 10-325 MG tablet [[id number]] but prescribed the meds for November. Pt states she was instructed to requested Dr. Alphonsus Sias to resent rx & modify the instructions on rx? Call back # [phone]

## 2022-12-28 NOTE — Telephone Encounter (Signed)
There was a note to pharmacy that may have been an old note that said the rx would complete the rx until Novmber. I believe that was an old note. Will forward to Dr Alphonsus Sias to make sure.

## 2023-01-05 ENCOUNTER — Ambulatory Visit (INDEPENDENT_AMBULATORY_CARE_PROVIDER_SITE_OTHER): Payer: Medicare HMO | Admitting: Internal Medicine

## 2023-01-05 ENCOUNTER — Encounter: Payer: Self-pay | Admitting: Internal Medicine

## 2023-01-05 VITALS — BP 124/84 | HR 44 | Temp 97.8°F | Ht 67.0 in | Wt 184.0 lb

## 2023-01-05 DIAGNOSIS — E1165 Type 2 diabetes mellitus with hyperglycemia: Secondary | ICD-10-CM | POA: Diagnosis not present

## 2023-01-05 DIAGNOSIS — E1159 Type 2 diabetes mellitus with other circulatory complications: Secondary | ICD-10-CM

## 2023-01-05 DIAGNOSIS — F39 Unspecified mood [affective] disorder: Secondary | ICD-10-CM

## 2023-01-05 DIAGNOSIS — F1120 Opioid dependence, uncomplicated: Secondary | ICD-10-CM | POA: Diagnosis not present

## 2023-01-05 DIAGNOSIS — R42 Dizziness and giddiness: Secondary | ICD-10-CM

## 2023-01-05 DIAGNOSIS — M549 Dorsalgia, unspecified: Secondary | ICD-10-CM

## 2023-01-05 DIAGNOSIS — Z7984 Long term (current) use of oral hypoglycemic drugs: Secondary | ICD-10-CM

## 2023-01-05 DIAGNOSIS — G8929 Other chronic pain: Secondary | ICD-10-CM | POA: Diagnosis not present

## 2023-01-05 LAB — POCT GLYCOSYLATED HEMOGLOBIN (HGB A1C): Hemoglobin A1C: 7.4 % — AB (ref 4.0–5.6)

## 2023-01-05 MED ORDER — NORTRIPTYLINE HCL 50 MG PO CAPS: 150.0000 mg | ORAL_CAPSULE | Freq: Every day | ORAL | 3 refills | Status: DC

## 2023-01-05 MED ORDER — MECLIZINE HCL 12.5 MG PO TABS: 12.5000 mg | ORAL_TABLET | Freq: Three times a day (TID) | ORAL | 3 refills | Status: AC | PRN

## 2023-01-05 NOTE — Assessment & Plan Note (Signed)
Doing okay with family support duloxetine

## 2023-01-05 NOTE — Assessment & Plan Note (Signed)
PDMP reviewed No concerns 

## 2023-01-05 NOTE — Assessment & Plan Note (Signed)
Antalgic but not ataxic gait Seems vestibular Will try meclizine

## 2023-01-05 NOTE — Assessment & Plan Note (Signed)
Controlled with oxycodone 10/325 --five daily duloxetine

## 2023-01-05 NOTE — Assessment & Plan Note (Addendum)
Lab Results  Component Value Date   HGBA1C 7.4 (A) 01/05/2023   Much better adding the jardiance 10 to lantus 40 daily, and metformin 1000 bid Uses nortriptyline 150 at bedtime and duloxetine 90mg 

## 2023-01-05 NOTE — Progress Notes (Signed)
Subjective:    Patient ID: Cynthia Singh, female    DOB: Jun 07, 1940, 82 y.o.   MRN: [id number]  HPI Here for follow up of chronic pain --and poorly controlled diabetes With granddaughter  Having some intermittent dizzy spells--can be quite bad Mostly with moving or getting up Spinning sensation Walking ia bout the same--with cane Some headaches Some ringing in ears---goes back a while  Did start jardiance No apparent problems--did have brief vaginal symptoms (but resolved) Not checking sugar much---low 100's for the most part  Same pain issues Still uses the oxycodone 2 twice a day and one later on Family still helps with heavy housework, cooking, etc She can make sandwich, etc Son there in day--till he goes to work in evening  Current Outpatient Medications on File Prior to Visit  Medication Sig Dispense Refill   amLODipine (NORVASC) 2.5 MG tablet TAKE 1 TABLET BY MOUTH DAILY. PLEASE CONTACT [facility] FOR AN APPOINTMENT. CALL [phone] OR USE MYCHART. 90 tablet 1   aspirin EC 81 MG tablet Take 81 mg by mouth daily.     atorvastatin (LIPITOR) 40 MG tablet TAKE 1 TABLET BY MOUTH EVERY DAY 90 tablet 3   clopidogrel (PLAVIX) 75 MG tablet TAKE 1 TABLET BY MOUTH EVERY DAY 90 tablet 3   DULoxetine (CYMBALTA) 30 MG capsule TAKE 1 CAPSULE BY MOUTH EVERY DAY 90 capsule 2   DULoxetine (CYMBALTA) 60 MG capsule TAKE 1 CAPSULE BY MOUTH EVERY DAY 90 capsule 3   empagliflozin (JARDIANCE) 25 MG TABS tablet Take 1 tablet (25 mg total) by mouth daily before breakfast. 90 tablet 3   insulin glargine (LANTUS SOLOSTAR) 100 UNIT/ML Solostar Pen Inject 40 Units into the skin daily. TAKE 40 UNITS DAILY UNDER SKIN 1 mL 0   Insulin Pen Needle (PEN NEEDLES) 32G X 5 MM MISC 1 Units by Does not apply route daily. 100 each 3   metFORMIN (GLUCOPHAGE-XR) 500 MG 24 hr tablet TAKE 2 TABLETS (1,000 MG TOTAL) BY MOUTH IN THE MORNING AND AT BEDTIME. 360 tablet 3   nitroGLYCERIN (NITROSTAT) 0.4 MG  SL tablet Place 1 tablet (0.4 mg total) under the tongue every 5 (five) minutes as needed for chest pain. (Patient taking differently: Place 0.4 mg under the tongue daily. For arm/back pain) 30 tablet 0   nortriptyline (PAMELOR) 50 MG capsule TAKE 3 CAPSULES (150 MG TOTAL) BY MOUTH AT BEDTIME. 270 capsule 3   oxyCODONE-acetaminophen (PERCOCET) 10-325 MG tablet Take 1-2 tablets by mouth every 6 (six) hours as needed for pain. M54.9 150 tablet 0   polyethylene glycol (MIRALAX / GLYCOLAX) packet Take 17 g by mouth daily as needed.      No current facility-administered medications on file prior to visit.    No Known Allergies  Past Medical History:  Diagnosis Date   CAD (coronary artery disease)    a. Remote nonobstructive disease but in 10/2012 Cath/PCI: s/p DES to RCA. b. cath 02/08/15 DES to prox LAD and balloon angioplasty of ost D1, EF 55-65%; c. 08/2019 PCI/DES prox/distal RCA (overlapping prev RCA stent). Prev placed RCA/LAD stents patent.   Concussion    SAH AFTER FALL 3/18   Depression    Diabetes mellitus, type 2 (HCC)    Dyslipidemia    Episodic mood disorder (HCC)    Hypertension    Myocardial infarction (HCC)    2014   Obesity    Osteoarthritis of spine    knees also   Peripheral vascular disease,  unspecified (HCC) 2020   severe disease on home screening by insurance    Past Surgical History:  Procedure Laterality Date   ABDOMINAL HYSTERECTOMY     APPENDECTOMY  1959   CARDIAC CATHETERIZATION N/A 02/08/2015   Procedure: Left Heart Cath and Coronary Angiography;  Surgeon: Tonny Bollman, MD;  Location: [facility] INVASIVE CV LAB;  Service: Cardiovascular;  Laterality: N/A;   CARDIAC CATHETERIZATION N/A 02/08/2015   Procedure: Coronary Stent Intervention;  Surgeon: Tonny Bollman, MD;  Location: [facility] INVASIVE CV LAB;  Service: Cardiovascular;  Laterality: N/A;   CARDIAC CATHETERIZATION N/A 02/08/2015   Procedure: Intravascular Pressure Wire/FFR Study;  Surgeon: Tonny Bollman, MD;   Location: [facility] INVASIVE CV LAB;  Service: Cardiovascular;  Laterality: N/A;   CATARACT EXTRACTION W/PHACO Left 11/10/2016   Procedure: CATARACT EXTRACTION PHACO AND INTRAOCULAR LENS PLACEMENT (IOC) suture placed in left eye at end of procedure;  Surgeon: Galen Manila, MD;  Location: [facility] ORS;  Service: Ophthalmology;  Laterality: Left;  [place]  3.20 AP% 27.7 CDE 55.63 Fluid pack lot # [id number]   CHOLECYSTECTOMY     COMBINED HYSTERECTOMY ABDOMINAL W/ A&P REPAIR / OOPHORECTOMY  1968   CORONARY ANGIOPLASTY     STENTS X 5   CORONARY STENT INTERVENTION N/A 09/19/2019   Procedure: CORONARY STENT INTERVENTION;  Surgeon: Yvonne Kendall, MD;  Location: [facility] INVASIVE CV LAB;  Service: Cardiovascular;  Laterality: N/A;   CORONARY STENT PLACEMENT  2014   DILATION AND CURETTAGE OF UTERUS     GALLBLADDER SURGERY  1968   LEFT HEART CATH AND CORONARY ANGIOGRAPHY Left 09/19/2019   Procedure: LEFT HEART CATH AND CORONARY ANGIOGRAPHY;  Surgeon: Yvonne Kendall, MD;  Location: [facility] INVASIVE CV LAB;  Service: Cardiovascular;  Laterality: Left;   LEFT HEART CATHETERIZATION WITH CORONARY ANGIOGRAM N/A 10/20/2012   Procedure: LEFT HEART CATHETERIZATION WITH CORONARY ANGIOGRAM;  Surgeon: Tonny Bollman, MD;  Location: [facility] CATH LAB;  Service: Cardiovascular;  Laterality: N/A;   multiple D&C     TONSILLECTOMY  age 6    Family History  Adopted: Yes  Problem Relation Age of Onset   Cancer Brother        Colon   Cancer Son    Coronary artery disease Neg Hx    Heart attack Neg Hx     Social History   Socioeconomic History   Marital status: Widowed    Spouse name: Not on file   Number of children: 3   Years of education: 83   Highest education level: Not on file  Occupational History   Occupation: CASHIER  Tobacco Use   Smoking status: Never    Passive exposure: Past   Smokeless tobacco: Never  Vaping Use   Vaping status: Never Used  Substance and Sexual Activity   Alcohol use: No   Drug use: No    Sexual activity: Never    Birth control/protection: Post-menopausal  Other Topics Concern   Not on file  Social History Narrative   Widowed '13. 2 sons- '63, '66; one daughter-'69;    11 grandchildren--has raised 9 of her grandchildren 4 great grandchildren.   Lives with 2 granddaughters and adoptive son      Has living will   Probably would have granddaughter Cristie Hem   Would accept resuscitation but no prolonged ventilation   Not sure about tube feeds   Social Determinants of Health   Financial Resource Strain: Medium Risk (06/24/2021)   Overall Financial Resource Strain (CARDIA)    Difficulty of Paying Living Expenses: Somewhat hard  Food Insecurity: Not on file  Transportation Needs: No Transportation Needs (06/24/2021)   PRAPARE - Administrator, Civil Service (Medical): No    Lack of Transportation (Non-Medical): No  Physical Activity: Not on file  Stress: Not on file  Social Connections: Not on file  Intimate Partner Violence: Not on file   Review of Systems Appetite is off some Has lost some weight Missed nortriptyline due to cost--now got with [facility]     Objective:   Physical Exam Constitutional:      Appearance: Normal appearance.  Cardiovascular:     Rate and Rhythm: Normal rate and regular rhythm.     Comments: Faint pedal pulses Pulmonary:     Effort: Pulmonary effort is normal.     Breath sounds: Normal breath sounds. No wheezing or rales.  Musculoskeletal:     Cervical back: Neck supple.     Right lower leg: No edema.     Left lower leg: No edema.  Lymphadenopathy:     Cervical: No cervical adenopathy.  Neurological:     Mental Status: She is alert.  Psychiatric:        Mood and Affect: Mood normal.        Behavior: Behavior normal.            Assessment & Plan:

## 2023-01-07 LAB — DRUG MONITORING, PANEL 8 WITH CONFIRMATION, URINE
6 Acetylmorphine: NEGATIVE ng/mL (ref <10)
Alcohol Metabolites: NEGATIVE ng/mL (ref <500)
Amphetamines: NEGATIVE ng/mL (ref <500)
Benzodiazepines: NEGATIVE ng/mL (ref <100)
Buprenorphine, Urine: NEGATIVE ng/mL (ref <5)
Cocaine Metabolite: NEGATIVE ng/mL (ref <150)
Codeine: NEGATIVE ng/mL (ref <50)
Creatinine: 149.8 mg/dL (ref >=20.0)
Hydrocodone: NEGATIVE ng/mL (ref <50)
Hydromorphone: NEGATIVE ng/mL (ref <50)
MDMA: NEGATIVE ng/mL (ref <500)
Marijuana Metabolite: NEGATIVE ng/mL (ref <20)
Morphine: NEGATIVE ng/mL (ref <50)
Norhydrocodone: NEGATIVE ng/mL (ref <50)
Noroxycodone: >10000 ng/mL — ABNORMAL HIGH (ref <50)
Opiates: NEGATIVE ng/mL (ref <100)
Oxidant: NEGATIVE ug/mL (ref <200)
Oxycodone: 8952 ng/mL — ABNORMAL HIGH (ref <50)
Oxycodone: POSITIVE ng/mL — AB (ref <100)
Oxymorphone: 3648 ng/mL — ABNORMAL HIGH (ref <50)
pH: 5.6 (ref 4.5–9.0)

## 2023-01-07 LAB — DM TEMPLATE

## 2023-01-13 ENCOUNTER — Other Ambulatory Visit: Payer: Self-pay | Admitting: Internal Medicine

## 2023-01-27 ENCOUNTER — Other Ambulatory Visit: Payer: Self-pay | Admitting: Internal Medicine

## 2023-01-27 MED ORDER — OXYCODONE-ACETAMINOPHEN 10-325 MG PO TABS: 1.0000 | ORAL_TABLET | Freq: Four times a day (QID) | ORAL | 0 refills | Status: DC | PRN

## 2023-01-27 NOTE — Telephone Encounter (Signed)
Prescription Request  01/27/2023  LOV: 01/05/2023  What is the name of the medication or equipment?  oxyCODONE-acetaminophen (PERCOCET) 10-325 MG tablet     Have you contacted your pharmacy to request a refill? No   Which pharmacy would you like this sent to?  [facility] [address] [place] [facility] [address] [place] [address] Phone: [phone] Fax: [phone]     Patient notified that their request is being sent to the clinical staff for review and that they should receive a response within 2 business days.   Please advise at Mobile There is no such number on file (mobile).

## 2023-01-27 NOTE — Telephone Encounter (Signed)
Name of Medication: Oxycodone [facility] Last Fill or Written Date and Quantity: 12/28/22 #150 Last Office Visit and Type: 01/05/23 Next Office Visit and Type: 04/12/23 Last Controlled Substance Agreement Date: 01/05/23 Last UDS: 01/05/23

## 2023-01-27 NOTE — Addendum Note (Signed)
Addended by: Tillman Abide I on: 01/27/2023 01:48 PM   Modules accepted: Orders

## 2023-01-27 NOTE — Addendum Note (Signed)
Addended by: Eual Fines on: 01/27/2023 09:46 AM   Modules accepted: Orders

## 2023-01-28 ENCOUNTER — Emergency Department: Payer: Medicare HMO

## 2023-01-28 ENCOUNTER — Emergency Department
Admission: EM | Admit: 2023-01-28 | Discharge: 2023-01-28 | Disposition: A | Discharge status: Home / Self Care | Payer: Medicare HMO | Attending: Emergency Medicine | Admitting: Emergency Medicine

## 2023-01-28 ENCOUNTER — Other Ambulatory Visit: Payer: Self-pay

## 2023-01-28 ENCOUNTER — Encounter: Payer: Self-pay | Admitting: *Deleted

## 2023-01-28 DIAGNOSIS — S0003XA Contusion of scalp, initial encounter: Secondary | ICD-10-CM | POA: Diagnosis not present

## 2023-01-28 DIAGNOSIS — I10 Essential (primary) hypertension: Secondary | ICD-10-CM | POA: Diagnosis not present

## 2023-01-28 DIAGNOSIS — Y92009 Unspecified place in unspecified non-institutional (private) residence as the place of occurrence of the external cause: Secondary | ICD-10-CM | POA: Insufficient documentation

## 2023-01-28 DIAGNOSIS — R519 Headache, unspecified: Secondary | ICD-10-CM | POA: Diagnosis present

## 2023-01-28 DIAGNOSIS — W0110XA Fall on same level from slipping, tripping and stumbling with subsequent striking against unspecified object, initial encounter: Secondary | ICD-10-CM | POA: Diagnosis not present

## 2023-01-28 DIAGNOSIS — I6523 Occlusion and stenosis of bilateral carotid arteries: Secondary | ICD-10-CM | POA: Diagnosis not present

## 2023-01-28 DIAGNOSIS — E119 Type 2 diabetes mellitus without complications: Secondary | ICD-10-CM | POA: Insufficient documentation

## 2023-01-28 DIAGNOSIS — Z043 Encounter for examination and observation following other accident: Secondary | ICD-10-CM | POA: Diagnosis not present

## 2023-01-28 DIAGNOSIS — I700 Atherosclerosis of aorta: Secondary | ICD-10-CM | POA: Diagnosis not present

## 2023-01-28 MED ORDER — ACETAMINOPHEN 500 MG PO TABS
1000.0000 mg | ORAL_TABLET | Freq: Once | ORAL | Status: AC
  Administered 2023-01-28: 1000 mg via ORAL
  Filled 2023-01-28: qty 2

## 2023-01-28 NOTE — ED Provider Notes (Signed)
[facility] Provider Note    Event Date/Time   First MD Initiated Contact with Patient 01/28/23 2316     (approximate)   History   Chief Complaint: Fall   HPI  Cynthia Singh is a 82 y.o. female with a past history of diabetes, osteoporosis, hypertension who comes ED complaining of headache after a trip and fall at home which caused her to hit the right side of her head on the floor.  No LOC.  Denies any preceding symptoms, no palpitations or lightheadedness.  Reports that she feels okay.  No other complaints     Physical Exam   Triage Vital Signs: ED Triage Vitals  Encounter Vitals Group     BP 01/28/23 2137 (!) 169/81     Systolic BP Percentile --      Diastolic BP Percentile --      Pulse Rate 01/28/23 2137 91     Resp 01/28/23 2137 20     Temp 01/28/23 2137 98.5 F (36.9 C)     Temp Source 01/28/23 2137 Oral     SpO2 01/28/23 2140 95 %     Weight 01/28/23 2137 188 lb (85.3 kg)     Height 01/28/23 2137 5\' 8"  (1.727 m)     Head Circumference --      Peak Flow --      Pain Score 01/28/23 2137 8     Pain Loc --      Pain Education --      Exclude from Growth Chart --     Most recent vital signs: Vitals:   01/28/23 2137 01/28/23 2140  BP: (!) 169/81   Pulse: 91   Resp: 20   Temp: 98.5 F (36.9 C)   SpO2:  95%    General: Awake, no distress.  CV:  Good peripheral perfusion.  Resp:  Normal effort.  Abd:  No distention.  Other:  Ambulatory.  Full range of motion.  Cranial nerves III through XII intact.  2 cm subcutaneous hematoma on the right lateral forehead.  No laceration.   ED Results / Procedures / Treatments   Labs (all labs ordered are listed, but only abnormal results are displayed) Labs Reviewed - No data to display   EKG    RADIOLOGY CT head interpreted by me, negative for intracranial hemorrhage.  Radiology report reviewed  CT cervical spine unremarkable   PROCEDURES:  Procedures   MEDICATIONS  ORDERED IN ED: Medications - No data to display   IMPRESSION / MDM / ASSESSMENT AND PLAN / ED COURSE  I reviewed the triage vital signs and the nursing notes.  DDx: Intracranial hemorrhage, skull fracture, C-spine fracture, scalp hematoma  Patient's presentation is most consistent with acute presentation with potential threat to life or bodily function.  Elderly patient presents with mechanical fall, blunt head trauma.  Not on blood thinners, normal neuroexam.  Has scalp hematoma overlying the right temple area.  CTs obtained which are negative for acute injury.  Counseled patient on possible concussion symptoms and home care.  Stable for discharge       FINAL CLINICAL IMPRESSION(S) / ED DIAGNOSES   Final diagnoses:  Scalp hematoma, initial encounter     Rx / DC Orders   ED Discharge Orders     None        Note:  This document was prepared using Dragon voice recognition software and may include unintentional dictation errors.   Sharman Cheek, MD 01/29/23 [phone]

## 2023-01-28 NOTE — ED Triage Notes (Addendum)
Pt tripped and fell tonight striking the floor.  No loc.  No n/v  pt has a hematoma to right side of forehead.  Pt states she has a headache.   Pt alert.  Speech clear.  No blood thinners.  Pt denies neck or back pain.

## 2023-01-28 NOTE — ED Notes (Signed)
ED Provider at bedside. 

## 2023-02-24 ENCOUNTER — Other Ambulatory Visit: Payer: Self-pay | Admitting: Internal Medicine

## 2023-02-24 MED ORDER — OXYCODONE-ACETAMINOPHEN 10-325 MG PO TABS: 1.0000 | ORAL_TABLET | Freq: Four times a day (QID) | ORAL | 0 refills | Status: DC | PRN

## 2023-02-24 NOTE — Telephone Encounter (Signed)
Prescription Request  02/24/2023  LOV: 01/05/2023  What is the name of the medication or equipment? oxyCODONE-acetaminophen (PERCOCET) 10-325 MG tablet   Have you contacted your pharmacy to request a refill? No   Which pharmacy would you like this sent to?  [facility] [address] [place] - [address] [place] [address] Phone: [phone] Fax: [phone]     Patient notified that their request is being sent to the clinical staff for review and that they should receive a response within 2 business days.   Please advise at [facility] [phone]

## 2023-02-24 NOTE — Telephone Encounter (Signed)
Name of Medication: Oxycodone [facility] Last Fill or Written Date and Quantity: 01/27/23 #150 Last Office Visit and Type: 01/05/23 Next Office Visit and Type: 04/12/23 Last Controlled Substance Agreement Date: 01/05/23 Last UDS: 01/05/23

## 2023-02-25 ENCOUNTER — Telehealth: Payer: Self-pay

## 2023-02-25 NOTE — Telephone Encounter (Signed)
Transition Care Management Unsuccessful Follow-up Telephone Call  Date of discharge and from where:  01/28/2023 [facility]  Attempts:  1st Attempt  Reason for unsuccessful TCM follow-up call:  No answer/busy  Cynthia Singh [facility]  [facility], [facility] Guide Direct Dial: [phone]  Website: [email or web address]

## 2023-02-26 ENCOUNTER — Telehealth: Payer: Self-pay

## 2023-02-26 NOTE — Telephone Encounter (Signed)
Transition Care Management Unsuccessful Follow-up Telephone Call  Date of discharge and from where:  01/28/2023 [facility]  Attempts:  2nd Attempt  Reason for unsuccessful TCM follow-up call:  No answer/busy  Tiffiny Worthy [facility]  [facility], [facility] Guide Direct Dial: [phone]  Website: [email or web address]

## 2023-03-26 ENCOUNTER — Other Ambulatory Visit: Payer: Self-pay | Admitting: Internal Medicine

## 2023-03-26 MED ORDER — OXYCODONE-ACETAMINOPHEN 10-325 MG PO TABS: 1.0000 | ORAL_TABLET | Freq: Four times a day (QID) | ORAL | 0 refills | Status: DC | PRN

## 2023-03-26 NOTE — Telephone Encounter (Signed)
Name of Medication: Oxycodone [facility] Last Fill or Written Date and Quantity: 02/24/23 #150 Last Office Visit and Type: 01/05/23 Next Office Visit and Type: 04/12/23 Last Controlled Substance Agreement Date: 01/05/23 Last UDS: 01/05/23

## 2023-03-26 NOTE — Telephone Encounter (Signed)
Prescription Request  03/26/2023  LOV: 01/05/2023  What is the name of the medication or equipment? oxyCODONE-acetaminophen (PERCOCET) 10-325 MG tablet   Have you contacted your pharmacy to request a refill? No   Which pharmacy would you like this sent to?  [facility] [address] [place] - [address] [place] [address] Phone: [phone] Fax: [phone]     Patient notified that their request is being sent to the clinical staff for review and that they should receive a response within 2 business days.   Please advise at [facility] [phone]

## 2023-04-12 ENCOUNTER — Encounter: Payer: Medicare HMO | Admitting: Internal Medicine

## 2023-04-22 ENCOUNTER — Other Ambulatory Visit: Payer: Self-pay | Admitting: Internal Medicine

## 2023-04-22 MED ORDER — OXYCODONE-ACETAMINOPHEN 10-325 MG PO TABS: 1.0000 | ORAL_TABLET | Freq: Four times a day (QID) | ORAL | 0 refills | Status: DC | PRN

## 2023-04-22 NOTE — Telephone Encounter (Signed)
Prescription Request  04/22/2023  LOV: 01/05/2023  What is the name of the medication or equipment? oxyCODONE-acetaminophen (PERCOCET) 10-325 MG tablet   Have you contacted your pharmacy to request a refill? No   Which pharmacy would you like this sent to?  [facility] [address], [place] - [address] [place] [address] Phone: [phone] Fax: [phone]  Patient notified that their request is being sent to the clinical staff for review and that they should receive a response within 2 business days.   Please advise at [facility] [phone]

## 2023-04-22 NOTE — Telephone Encounter (Signed)
Name of Medication: Oxycodone [facility] Last Fill or Written Date and Quantity: 03/26/23 #150 Last Office Visit and Type: 01/05/23 Next Office Visit and Type: 04/27/23 Last Controlled Substance Agreement Date: 01/05/23 Last UDS: 01/05/23

## 2023-04-23 NOTE — Telephone Encounter (Signed)
Called pt to advise her Dr Alphonsus Sias [facility] yesterday. She may have to go to the pharmacy to actually speak to someone since she cannot get anyone on the phone.

## 2023-04-23 NOTE — Telephone Encounter (Signed)
Pt called checking status of med refill? Call back # [phone]

## 2023-04-27 ENCOUNTER — Encounter: Payer: Medicare HMO | Admitting: Internal Medicine

## 2023-04-30 ENCOUNTER — Emergency Department: Payer: Medicare HMO

## 2023-04-30 ENCOUNTER — Inpatient Hospital Stay
Admission: EM | Admit: 2023-04-30 | Discharge: 2023-05-03 | DRG: 871 | Disposition: A | Discharge status: Home Health | Payer: Medicare HMO | Attending: Internal Medicine | Admitting: Internal Medicine

## 2023-04-30 DIAGNOSIS — M47812 Spondylosis without myelopathy or radiculopathy, cervical region: Secondary | ICD-10-CM | POA: Diagnosis not present

## 2023-04-30 DIAGNOSIS — Z955 Presence of coronary angioplasty implant and graft: Secondary | ICD-10-CM

## 2023-04-30 DIAGNOSIS — E1165 Type 2 diabetes mellitus with hyperglycemia: Secondary | ICD-10-CM | POA: Diagnosis present

## 2023-04-30 DIAGNOSIS — Z6828 Body mass index (BMI) 28.0-28.9, adult: Secondary | ICD-10-CM

## 2023-04-30 DIAGNOSIS — R739 Hyperglycemia, unspecified: Secondary | ICD-10-CM | POA: Diagnosis not present

## 2023-04-30 DIAGNOSIS — M5030 Other cervical disc degeneration, unspecified cervical region: Secondary | ICD-10-CM | POA: Diagnosis not present

## 2023-04-30 DIAGNOSIS — R6520 Severe sepsis without septic shock: Secondary | ICD-10-CM | POA: Diagnosis present

## 2023-04-30 DIAGNOSIS — R4182 Altered mental status, unspecified: Secondary | ICD-10-CM | POA: Diagnosis not present

## 2023-04-30 DIAGNOSIS — R319 Hematuria, unspecified: Secondary | ICD-10-CM | POA: Diagnosis present

## 2023-04-30 DIAGNOSIS — A4150 Gram-negative sepsis, unspecified: Principal | ICD-10-CM | POA: Diagnosis present

## 2023-04-30 DIAGNOSIS — E119 Type 2 diabetes mellitus without complications: Secondary | ICD-10-CM | POA: Diagnosis not present

## 2023-04-30 DIAGNOSIS — E88819 Insulin resistance, unspecified: Secondary | ICD-10-CM | POA: Diagnosis present

## 2023-04-30 DIAGNOSIS — J449 Chronic obstructive pulmonary disease, unspecified: Secondary | ICD-10-CM | POA: Diagnosis present

## 2023-04-30 DIAGNOSIS — R32 Unspecified urinary incontinence: Secondary | ICD-10-CM | POA: Diagnosis not present

## 2023-04-30 DIAGNOSIS — R000 Tachycardia, unspecified: Secondary | ICD-10-CM | POA: Diagnosis not present

## 2023-04-30 DIAGNOSIS — Z9049 Acquired absence of other specified parts of digestive tract: Secondary | ICD-10-CM | POA: Diagnosis not present

## 2023-04-30 DIAGNOSIS — E876 Hypokalemia: Secondary | ICD-10-CM | POA: Diagnosis present

## 2023-04-30 DIAGNOSIS — I2510 Atherosclerotic heart disease of native coronary artery without angina pectoris: Secondary | ICD-10-CM | POA: Diagnosis present

## 2023-04-30 DIAGNOSIS — Z7984 Long term (current) use of oral hypoglycemic drugs: Secondary | ICD-10-CM | POA: Diagnosis not present

## 2023-04-30 DIAGNOSIS — G9341 Metabolic encephalopathy: Secondary | ICD-10-CM | POA: Diagnosis present

## 2023-04-30 DIAGNOSIS — I517 Cardiomegaly: Secondary | ICD-10-CM | POA: Diagnosis not present

## 2023-04-30 DIAGNOSIS — Z7982 Long term (current) use of aspirin: Secondary | ICD-10-CM

## 2023-04-30 DIAGNOSIS — I252 Old myocardial infarction: Secondary | ICD-10-CM

## 2023-04-30 DIAGNOSIS — N390 Urinary tract infection, site not specified: Principal | ICD-10-CM | POA: Diagnosis present

## 2023-04-30 DIAGNOSIS — Z7902 Long term (current) use of antithrombotics/antiplatelets: Secondary | ICD-10-CM

## 2023-04-30 DIAGNOSIS — B9620 Unspecified Escherichia coli [E. coli] as the cause of diseases classified elsewhere: Secondary | ICD-10-CM | POA: Diagnosis not present

## 2023-04-30 DIAGNOSIS — Z79899 Other long term (current) drug therapy: Secondary | ICD-10-CM

## 2023-04-30 DIAGNOSIS — R509 Fever, unspecified: Secondary | ICD-10-CM | POA: Diagnosis not present

## 2023-04-30 DIAGNOSIS — R0902 Hypoxemia: Secondary | ICD-10-CM | POA: Diagnosis not present

## 2023-04-30 DIAGNOSIS — E785 Hyperlipidemia, unspecified: Secondary | ICD-10-CM | POA: Diagnosis present

## 2023-04-30 DIAGNOSIS — I10 Essential (primary) hypertension: Secondary | ICD-10-CM | POA: Diagnosis present

## 2023-04-30 DIAGNOSIS — W19XXXA Unspecified fall, initial encounter: Secondary | ICD-10-CM | POA: Diagnosis present

## 2023-04-30 DIAGNOSIS — Z90710 Acquired absence of both cervix and uterus: Secondary | ICD-10-CM | POA: Diagnosis not present

## 2023-04-30 DIAGNOSIS — Y92009 Unspecified place in unspecified non-institutional (private) residence as the place of occurrence of the external cause: Secondary | ICD-10-CM

## 2023-04-30 DIAGNOSIS — E669 Obesity, unspecified: Secondary | ICD-10-CM | POA: Diagnosis not present

## 2023-04-30 DIAGNOSIS — Z794 Long term (current) use of insulin: Secondary | ICD-10-CM | POA: Diagnosis not present

## 2023-04-30 DIAGNOSIS — R059 Cough, unspecified: Secondary | ICD-10-CM | POA: Diagnosis not present

## 2023-04-30 DIAGNOSIS — N3001 Acute cystitis with hematuria: Secondary | ICD-10-CM | POA: Diagnosis not present

## 2023-04-30 DIAGNOSIS — R0989 Other specified symptoms and signs involving the circulatory and respiratory systems: Secondary | ICD-10-CM | POA: Diagnosis not present

## 2023-04-30 LAB — COMPREHENSIVE METABOLIC PANEL
ALT: 12 U/L (ref 0–44)
AST: 16 U/L (ref 15–41)
Albumin: 3.5 g/dL (ref 3.5–5.0)
Alkaline Phosphatase: 93 U/L (ref 38–126)
Anion gap: 11 (ref 5–15)
BUN: 18 mg/dL (ref 8–23)
CO2: 22 mmol/L (ref 22–32)
Calcium: 8.8 mg/dL — ABNORMAL LOW (ref 8.9–10.3)
Chloride: 99 mmol/L (ref 98–111)
Creatinine, Ser: 0.88 mg/dL (ref 0.44–1.00)
GFR, Estimated: >60 mL/min (ref >60)
Glucose, Bld: 236 mg/dL — ABNORMAL HIGH (ref 70–99)
Potassium: 3.5 mmol/L (ref 3.5–5.1)
Sodium: 132 mmol/L — ABNORMAL LOW (ref 135–145)
Total Bilirubin: 0.8 mg/dL (ref <1.2)
Total Protein: 7.6 g/dL (ref 6.5–8.1)

## 2023-04-30 LAB — CBC WITH DIFFERENTIAL/PLATELET
Abs Immature Granulocytes: 0.57 10*3/uL — ABNORMAL HIGH (ref 0.00–0.07)
Basophils Absolute: 0.1 10*3/uL (ref 0.0–0.1)
Basophils Relative: 0 %
Eosinophils Absolute: 0 10*3/uL (ref 0.0–0.5)
Eosinophils Relative: 0 %
HCT: 39.8 % (ref 36.0–46.0)
Hemoglobin: 13.1 g/dL (ref 12.0–15.0)
Immature Granulocytes: 3 %
Lymphocytes Relative: 5 %
Lymphs Abs: 1 10*3/uL (ref 0.7–4.0)
MCH: 28.3 pg (ref 26.0–34.0)
MCHC: 32.9 g/dL (ref 30.0–36.0)
MCV: 86 fL (ref 80.0–100.0)
Monocytes Absolute: 1.3 10*3/uL — ABNORMAL HIGH (ref 0.1–1.0)
Monocytes Relative: 7 %
Neutro Abs: 15.9 10*3/uL — ABNORMAL HIGH (ref 1.7–7.7)
Neutrophils Relative %: 85 %
Platelets: 283 10*3/uL (ref 150–400)
RBC: 4.63 MIL/uL (ref 3.87–5.11)
RDW: 13 % (ref 11.5–15.5)
WBC: 18.9 10*3/uL — ABNORMAL HIGH (ref 4.0–10.5)
nRBC: 0 % (ref 0.0–0.2)

## 2023-04-30 LAB — CBG MONITORING, ED
Glucose-Capillary: 224 mg/dL — ABNORMAL HIGH (ref 70–99)
Glucose-Capillary: 126 mg/dL — ABNORMAL HIGH (ref 70–99)
Glucose-Capillary: 141 mg/dL — ABNORMAL HIGH (ref 70–99)

## 2023-04-30 LAB — URINALYSIS, W/ REFLEX TO CULTURE (INFECTION SUSPECTED)
Bilirubin Urine: NEGATIVE
Glucose, UA: >=500 mg/dL — AB
Ketones, ur: NEGATIVE mg/dL
Nitrite: NEGATIVE
Protein, ur: 30 mg/dL — AB
RBC / HPF: >50 RBC/hpf (ref 0–5)
Specific Gravity, Urine: 1.013 (ref 1.005–1.030)
WBC, UA: >50 WBC/hpf (ref 0–5)
pH: 5 (ref 5.0–8.0)

## 2023-04-30 LAB — LACTIC ACID, PLASMA
Lactic Acid, Venous: 2.1 mmol/L (ref 0.5–1.9)
Lactic Acid, Venous: 1.4 mmol/L (ref 0.5–1.9)

## 2023-04-30 MED ORDER — ONDANSETRON HCL 4 MG/2ML IJ SOLN: 4.0000 mg | Freq: Four times a day (QID) | INTRAMUSCULAR | Status: DC | PRN

## 2023-04-30 MED ORDER — METFORMIN HCL ER 500 MG PO TB24
1000.0000 mg | ORAL_TABLET | Freq: Two times a day (BID) | ORAL | Status: DC
  Administered 2023-04-30 – 2023-05-03 (×7): 1000 mg via ORAL
  Filled 2023-04-30 (×11): qty 2

## 2023-04-30 MED ORDER — NORTRIPTYLINE HCL 25 MG PO CAPS
150.0000 mg | ORAL_CAPSULE | Freq: Every day | ORAL | Status: DC
  Administered 2023-04-30 – 2023-05-02 (×3): 150 mg via ORAL
  Filled 2023-04-30 (×3): qty 6

## 2023-04-30 MED ORDER — SODIUM CHLORIDE 0.9 % IV SOLN
2.0000 g | INTRAVENOUS | Status: DC
  Administered 2023-04-30 – 2023-05-03 (×4): 2 g via INTRAVENOUS
  Filled 2023-04-30 (×4): qty 20

## 2023-04-30 MED ORDER — METRONIDAZOLE 500 MG/100ML IV SOLN
500.0000 mg | Freq: Once | INTRAVENOUS | Status: AC
  Administered 2023-04-30: 500 mg via INTRAVENOUS
  Filled 2023-04-30: qty 100

## 2023-04-30 MED ORDER — ACETAMINOPHEN 325 MG RE SUPP
650.0000 mg | Freq: Once | RECTAL | Status: AC
  Administered 2023-04-30: 650 mg via RECTAL
  Filled 2023-04-30: qty 2

## 2023-04-30 MED ORDER — LACTATED RINGERS IV BOLUS
1000.0000 mL | Freq: Once | INTRAVENOUS | Status: AC
  Administered 2023-04-30: 1000 mL via INTRAVENOUS

## 2023-04-30 MED ORDER — ASPIRIN 81 MG PO TBEC
81.0000 mg | DELAYED_RELEASE_TABLET | Freq: Every day | ORAL | Status: DC
  Administered 2023-04-30 – 2023-05-03 (×4): 81 mg via ORAL
  Filled 2023-04-30 (×4): qty 1

## 2023-04-30 MED ORDER — AMLODIPINE BESYLATE 5 MG PO TABS: 2.5000 mg | ORAL_TABLET | Freq: Every day | ORAL | Status: DC

## 2023-04-30 MED ORDER — DULOXETINE HCL 30 MG PO CPEP
30.0000 mg | ORAL_CAPSULE | Freq: Every day | ORAL | Status: DC
  Administered 2023-04-30 – 2023-05-03 (×4): 30 mg via ORAL
  Filled 2023-04-30 (×4): qty 1

## 2023-04-30 MED ORDER — ENOXAPARIN SODIUM 40 MG/0.4ML IJ SOSY
40.0000 mg | PREFILLED_SYRINGE | INTRAMUSCULAR | Status: DC
  Administered 2023-04-30 – 2023-05-03 (×4): 40 mg via SUBCUTANEOUS
  Filled 2023-04-30 (×4): qty 0.4

## 2023-04-30 MED ORDER — ATORVASTATIN CALCIUM 20 MG PO TABS
40.0000 mg | ORAL_TABLET | Freq: Every day | ORAL | Status: DC
  Administered 2023-04-30 – 2023-05-03 (×4): 40 mg via ORAL
  Filled 2023-04-30 (×4): qty 2

## 2023-04-30 MED ORDER — ONDANSETRON HCL 4 MG PO TABS: 4.0000 mg | ORAL_TABLET | Freq: Four times a day (QID) | ORAL | Status: DC | PRN

## 2023-04-30 MED ORDER — VANCOMYCIN HCL IN DEXTROSE 1-5 GM/200ML-% IV SOLN
1000.0000 mg | Freq: Once | INTRAVENOUS | Status: AC
  Administered 2023-04-30: 1000 mg via INTRAVENOUS
  Filled 2023-04-30: qty 200

## 2023-04-30 MED ORDER — INSULIN GLARGINE-YFGN 100 UNIT/ML ~~LOC~~ SOLN
40.0000 [IU] | Freq: Every day | SUBCUTANEOUS | Status: DC
  Administered 2023-04-30 – 2023-05-02 (×3): 40 [IU] via SUBCUTANEOUS
  Filled 2023-04-30 (×4): qty 0.4

## 2023-04-30 MED ORDER — POLYETHYLENE GLYCOL 3350 17 G PO PACK: 17.0000 g | PACK | Freq: Every day | ORAL | Status: DC | PRN

## 2023-04-30 MED ORDER — INSULIN ASPART 100 UNIT/ML IJ SOLN
0.0000 [IU] | Freq: Three times a day (TID) | INTRAMUSCULAR | Status: DC
  Administered 2023-04-30: 5 [IU] via SUBCUTANEOUS
  Administered 2023-04-30 – 2023-05-02 (×3): 2 [IU] via SUBCUTANEOUS
  Administered 2023-05-03: 3 [IU] via SUBCUTANEOUS
  Filled 2023-04-30 (×5): qty 1

## 2023-04-30 MED ORDER — EMPAGLIFLOZIN 10 MG PO TABS
10.0000 mg | ORAL_TABLET | Freq: Every day | ORAL | Status: DC
  Administered 2023-04-30 – 2023-05-03 (×4): 10 mg via ORAL
  Filled 2023-04-30 (×5): qty 1

## 2023-04-30 MED ORDER — MECLIZINE HCL 25 MG PO TABS: 12.5000 mg | ORAL_TABLET | Freq: Three times a day (TID) | ORAL | Status: DC | PRN

## 2023-04-30 MED ORDER — OXYCODONE-ACETAMINOPHEN 5-325 MG PO TABS
1.0000 | ORAL_TABLET | Freq: Four times a day (QID) | ORAL | Status: DC | PRN
  Administered 2023-04-30 – 2023-05-03 (×11): 1 via ORAL
  Filled 2023-04-30 (×11): qty 1

## 2023-04-30 MED ORDER — SODIUM CHLORIDE 0.9 % IV SOLN
2.0000 g | Freq: Once | INTRAVENOUS | Status: AC
  Administered 2023-04-30: 2 g via INTRAVENOUS
  Filled 2023-04-30: qty 12.5

## 2023-04-30 MED ORDER — OXYCODONE-ACETAMINOPHEN 5-325 MG PO TABS
ORAL_TABLET | Freq: Four times a day (QID) | ORAL | Status: DC | PRN
  Filled 2023-04-30: qty 1

## 2023-04-30 NOTE — H&P (Signed)
History and Physical    Cynthia Singh HQI:[id number] DOB: 08/31/1940 DOA: 04/30/2023  PCP: Karie Schwalbe, MD (Confirm with patient/family/NH records and if not entered, this has to be entered at [facility] point of entry) Patient coming from: Home  I have personally briefly reviewed patient's old medical records in [facility]  Chief Complaint: Feeling better  HPI: Cynthia Singh is a 82 y.o. female with medical history significant of CAD status post stenting, IDDM with insulin resistance, HTN, morbid obesity, PVD, presented with UTI-like symptoms and confusion.  Patient started to have burning sensations when urinate about 2 days ago, then developed generalized weakness and chills and lower abdominal pain.  Last night, she started to feel lightheadedness and fell at home.  She was found on floor by family member confused.  She denied any head neck injury no LOC.  Denied any numbness weakness of any of the limbs.  No back pains.  She reported that after hydration in the ED she started feel better and no longer confused.  ED Course: Fever 103, tachycardia heart rate 115, blood pressure 137/99, O2 saturation 95% on room air.  UA showed gross UTI with hematuria, WBC 18.5 lactic acid 2.1 hemoglobin 13.1, creatinine 0.8 bicarb 22, K3.5.  Patient was given IV bolus x 1 and ceftriaxone x 1 in the ED.  Review of Systems: As per HPI otherwise 14 point review of systems negative.    Past Medical History:  Diagnosis Date   CAD (coronary artery disease)    a. Remote nonobstructive disease but in 10/2012 Cath/PCI: s/p DES to RCA. b. cath 02/08/15 DES to prox LAD and balloon angioplasty of ost D1, EF 55-65%; c. 08/2019 PCI/DES prox/distal RCA (overlapping prev RCA stent). Prev placed RCA/LAD stents patent.   Concussion    SAH AFTER FALL 3/18   Depression    Diabetes mellitus, type 2 (HCC)    Dyslipidemia    Episodic mood disorder (HCC)    Hypertension    Myocardial infarction (HCC)    2014    Obesity    Osteoarthritis of spine    knees also   Peripheral vascular disease, unspecified (HCC) 2020   severe disease on home screening by insurance    Past Surgical History:  Procedure Laterality Date   ABDOMINAL HYSTERECTOMY     APPENDECTOMY  1959   CARDIAC CATHETERIZATION N/A 02/08/2015   Procedure: Left Heart Cath and Coronary Angiography;  Surgeon: Tonny Bollman, MD;  Location: [facility] INVASIVE CV LAB;  Service: Cardiovascular;  Laterality: N/A;   CARDIAC CATHETERIZATION N/A 02/08/2015   Procedure: Coronary Stent Intervention;  Surgeon: Tonny Bollman, MD;  Location: [facility] INVASIVE CV LAB;  Service: Cardiovascular;  Laterality: N/A;   CARDIAC CATHETERIZATION N/A 02/08/2015   Procedure: Intravascular Pressure Wire/FFR Study;  Surgeon: Tonny Bollman, MD;  Location: [facility] INVASIVE CV LAB;  Service: Cardiovascular;  Laterality: N/A;   CATARACT EXTRACTION W/PHACO Left 11/10/2016   Procedure: CATARACT EXTRACTION PHACO AND INTRAOCULAR LENS PLACEMENT (IOC) suture placed in left eye at end of procedure;  Surgeon: Galen Manila, MD;  Location: [facility] ORS;  Service: Ophthalmology;  Laterality: Left;  [place]  3.20 AP% 27.7 CDE 55.63 Fluid pack lot # [id number]   CHOLECYSTECTOMY     COMBINED HYSTERECTOMY ABDOMINAL W/ A&P REPAIR / OOPHORECTOMY  1968   CORONARY ANGIOPLASTY     STENTS X 5   CORONARY STENT INTERVENTION N/A 09/19/2019   Procedure: CORONARY STENT INTERVENTION;  Surgeon: Yvonne Kendall, MD;  Location: [facility] INVASIVE CV  LAB;  Service: Cardiovascular;  Laterality: N/A;   CORONARY STENT PLACEMENT  2014   DILATION AND CURETTAGE OF UTERUS     GALLBLADDER SURGERY  1968   LEFT HEART CATH AND CORONARY ANGIOGRAPHY Left 09/19/2019   Procedure: LEFT HEART CATH AND CORONARY ANGIOGRAPHY;  Surgeon: Yvonne Kendall, MD;  Location: [facility] INVASIVE CV LAB;  Service: Cardiovascular;  Laterality: Left;   LEFT HEART CATHETERIZATION WITH CORONARY ANGIOGRAM N/A 10/20/2012   Procedure: LEFT HEART CATHETERIZATION WITH  CORONARY ANGIOGRAM;  Surgeon: Tonny Bollman, MD;  Location: [facility] CATH LAB;  Service: Cardiovascular;  Laterality: N/A;   multiple D&C     TONSILLECTOMY  age 83     reports that she has never smoked. She has been exposed to tobacco smoke. She has never used smokeless tobacco. She reports that she does not drink alcohol and does not use drugs.  No Known Allergies  Family History  Adopted: Yes  Problem Relation Age of Onset   Cancer Brother        Colon   Cancer Son    Coronary artery disease Neg Hx    Heart attack Neg Hx      Prior to Admission medications   Medication Sig Start Date End Date Taking? Authorizing Provider  amLODipine (NORVASC) 2.5 MG tablet TAKE 1 TABLET BY MOUTH DAILY. PLEASE CONTACT [facility] FOR AN APPOINTMENT. CALL [phone] OR USE MYCHART. 12/08/22   Joaquim Nam, MD  aspirin EC 81 MG tablet Take 81 mg by mouth daily.    [provider]  atorvastatin (LIPITOR) 40 MG tablet TAKE 1 TABLET BY MOUTH EVERY DAY 07/07/22   Tillman Abide I, MD  clopidogrel (PLAVIX) 75 MG tablet TAKE 1 TABLET BY MOUTH EVERY DAY 07/07/22   Tillman Abide I, MD  DULoxetine (CYMBALTA) 30 MG capsule TAKE 1 CAPSULE BY MOUTH EVERY DAY 01/13/23   Copland, Karleen Hampshire, MD  DULoxetine (CYMBALTA) 60 MG capsule TAKE 1 CAPSULE BY MOUTH EVERY DAY 11/02/22   Karie Schwalbe, MD  empagliflozin (JARDIANCE) 25 MG TABS tablet Take 1 tablet (25 mg total) by mouth daily before breakfast. 10/05/22   Karie Schwalbe, MD  insulin glargine (LANTUS SOLOSTAR) 100 UNIT/ML Solostar Pen Inject 40 Units into the skin daily. TAKE 40 UNITS DAILY UNDER SKIN 03/24/22   Tillman Abide I, MD  Insulin Pen Needle (PEN NEEDLES) 32G X 5 MM MISC 1 Units by Does not apply route daily. 03/24/22   Karie Schwalbe, MD  meclizine (ANTIVERT) 12.5 MG tablet Take 1-2 tablets (12.5-25 mg total) by mouth 3 (three) times daily as needed for dizziness. 01/05/23   Karie Schwalbe, MD  metFORMIN (GLUCOPHAGE-XR) 500 MG 24  hr tablet TAKE 2 TABLETS (1,000 MG TOTAL) BY MOUTH IN THE MORNING AND AT BEDTIME. 01/13/23   Copland, Karleen Hampshire, MD  nitroGLYCERIN (NITROSTAT) 0.4 MG SL tablet Place 1 tablet (0.4 mg total) under the tongue every 5 (five) minutes as needed for chest pain. Patient taking differently: Place 0.4 mg under the tongue daily. For arm/back pain 08/24/19   Karie Schwalbe, MD  nortriptyline (PAMELOR) 50 MG capsule Take 3 capsules (150 mg total) by mouth at bedtime. 01/05/23   Karie Schwalbe, MD  oxyCODONE-acetaminophen (PERCOCET) 10-325 MG tablet Take 1-2 tablets by mouth every 6 (six) hours as needed for pain. M54.9 04/22/23   Tillman Abide I, MD  polyethylene glycol (MIRALAX / GLYCOLAX) packet Take 17 g by mouth daily as needed.     [provider]    Physical Exam: Vitals:   04/30/23 0730 04/30/23 0800 04/30/23 0830 04/30/23 0900  BP: 130/63 114/61 118/65 112/61  Pulse: 97 96 92 88  Resp: 17 16 14 20   Temp:      TempSrc:      SpO2: 95% 95% 96% 96%    Constitutional: NAD, calm, comfortable Vitals:   04/30/23 0730 04/30/23 0800 04/30/23 0830 04/30/23 0900  BP: 130/63 114/61 118/65 112/61  Pulse: 97 96 92 88  Resp: 17 16 14 20   Temp:      TempSrc:      SpO2: 95% 95% 96% 96%   Eyes: PERRL, lids and conjunctivae normal ENMT: Mucous membranes are moist. Posterior pharynx clear of any exudate or lesions.Normal dentition.  Neck: normal, supple, no masses, no thyromegaly Respiratory: clear to auscultation bilaterally, no wheezing, no crackles. Normal respiratory effort. No accessory muscle use.  Cardiovascular: Regular rate and rhythm, no murmurs / rubs / gallops. No extremity edema. 2+ pedal pulses. No carotid bruits.  Abdomen: no tenderness, no masses palpated. No hepatosplenomegaly. Bowel sounds positive.  Musculoskeletal: no clubbing / cyanosis. No joint deformity upper and lower extremities. Good ROM, no contractures. Normal muscle tone.  Skin: no rashes, lesions, ulcers. No  induration Neurologic: CN 2-12 grossly intact. Sensation intact, DTR normal. Strength 5/5 in all 4.  Psychiatric: Normal judgment and insight. Alert and oriented x 3. Normal mood.   (Anything < 9 systems with 2 bullets each down codes to level 1) (If patient refuses exam can't bill higher level) (Make sure to document decubitus ulcers present on admission -- if possible -- and whether patient has chronic indwelling catheter at time of admission)  Labs on Admission: I have personally reviewed following labs and imaging studies  CBC: Recent Labs  Lab 04/30/23 0449  WBC 18.9*  NEUTROABS 15.9*  HGB 13.1  HCT 39.8  MCV 86.0  PLT 283   Basic Metabolic Panel: Recent Labs  Lab 04/30/23 0449  NA 132*  K 3.5  CL 99  CO2 22  GLUCOSE 236*  BUN 18  CREATININE 0.88  CALCIUM 8.8*   GFR: CrCl cannot be calculated (Unknown ideal weight.). Liver Function Tests: Recent Labs  Lab 04/30/23 0449  AST 16  ALT 12  ALKPHOS 93  BILITOT 0.8  PROT 7.6  ALBUMIN 3.5   No results for input(s): "LIPASE", "AMYLASE" in the last 168 hours. No results for input(s): "AMMONIA" in the last 168 hours. Coagulation Profile: No results for input(s): "INR", "PROTIME" in the last 168 hours. Cardiac Enzymes: No results for input(s): "CKTOTAL", "CKMB", "CKMBINDEX", "TROPONINI" in the last 168 hours. BNP (last 3 results) No results for input(s): "PROBNP" in the last 8760 hours. HbA1C: No results for input(s): "HGBA1C" in the last 72 hours. CBG: No results for input(s): "GLUCAP" in the last 168 hours. Lipid Profile: No results for input(s): "CHOL", "HDL", "LDLCALC", "TRIG", "CHOLHDL", "LDLDIRECT" in the last 72 hours. Thyroid Function Tests: No results for input(s): "TSH", "T4TOTAL", "FREET4", "T3FREE", "THYROIDAB" in the last 72 hours. Anemia Panel: No results for input(s): "VITAMINB12", "FOLATE", "FERRITIN", "TIBC", "IRON", "RETICCTPCT" in the last 72 hours. Urine analysis:    Component Value  Date/Time   COLORURINE YELLOW (A) 04/30/2023 0449   APPEARANCEUR CLOUDY (A) 04/30/2023 0449   LABSPEC 1.013 04/30/2023 0449   PHURINE 5.0 04/30/2023 0449   GLUCOSEU >=500 (A) 04/30/2023 0449   HGBUR LARGE (A) 04/30/2023 0449   BILIRUBINUR NEGATIVE 04/30/2023 0449   BILIRUBINUR neg 06/14/2019 1408  KETONESUR NEGATIVE 04/30/2023 0449   PROTEINUR 30 (A) 04/30/2023 0449   UROBILINOGEN 0.2 06/14/2019 1408   UROBILINOGEN 1.0 10/19/2012 1030   NITRITE NEGATIVE 04/30/2023 0449   LEUKOCYTESUR MODERATE (A) 04/30/2023 0449    Radiological Exams on Admission: CT Head Wo Contrast Result Date: 04/30/2023 CLINICAL DATA:  81 year old female with history of altered mental status. History of trauma from a fall. EXAM: CT HEAD WITHOUT CONTRAST CT CERVICAL SPINE WITHOUT CONTRAST TECHNIQUE: Multidetector CT imaging of the head and cervical spine was performed following the standard protocol without intravenous contrast. Multiplanar CT image reconstructions of the cervical spine were also generated. RADIATION DOSE REDUCTION: This exam was performed according to the departmental dose-optimization program which includes automated exposure control, adjustment of the mA and/or kV according to patient size and/or use of iterative reconstruction technique. COMPARISON:  Head and cervical spine CT 01/28/2023. FINDINGS: CT HEAD FINDINGS Brain: No evidence of acute infarction, hemorrhage, hydrocephalus, extra-axial collection or mass lesion/mass effect. Vascular: No hyperdense vessel or unexpected calcification. Skull: Normal. Negative for fracture or focal lesion. Sinuses/Orbits: No acute finding. Other: None. CT CERVICAL SPINE FINDINGS Alignment: Normal. Skull base and vertebrae: No acute fracture. No primary bone lesion or focal pathologic process. Soft tissues and spinal canal: No prevertebral fluid or swelling. No visible canal hematoma. Disc levels: Multilevel degenerative disc disease, most pronounced at C3-C4, C4-C5,  C5-C6 and C6-C7. Moderate multilevel facet arthropathy bilaterally. Upper chest: Unremarkable. Other: There are no aggressive appearing lytic or blastic lesions noted in the visualized portions of the skeleton. IMPRESSION: 1. No evidence of significant acute traumatic injury to the skull, brain or cervical spine. 2. The appearance of the brain is normal for age. 3. Multilevel degenerative disc disease and cervical spondylosis, as above. Electronically Signed   By: Trudie Reed M.D.   On: 04/30/2023 06:23   CT Cervical Spine Wo Contrast Result Date: 04/30/2023 CLINICAL DATA:  82 year old female with history of altered mental status. History of trauma from a fall. EXAM: CT HEAD WITHOUT CONTRAST CT CERVICAL SPINE WITHOUT CONTRAST TECHNIQUE: Multidetector CT imaging of the head and cervical spine was performed following the standard protocol without intravenous contrast. Multiplanar CT image reconstructions of the cervical spine were also generated. RADIATION DOSE REDUCTION: This exam was performed according to the departmental dose-optimization program which includes automated exposure control, adjustment of the mA and/or kV according to patient size and/or use of iterative reconstruction technique. COMPARISON:  Head and cervical spine CT 01/28/2023. FINDINGS: CT HEAD FINDINGS Brain: No evidence of acute infarction, hemorrhage, hydrocephalus, extra-axial collection or mass lesion/mass effect. Vascular: No hyperdense vessel or unexpected calcification. Skull: Normal. Negative for fracture or focal lesion. Sinuses/Orbits: No acute finding. Other: None. CT CERVICAL SPINE FINDINGS Alignment: Normal. Skull base and vertebrae: No acute fracture. No primary bone lesion or focal pathologic process. Soft tissues and spinal canal: No prevertebral fluid or swelling. No visible canal hematoma. Disc levels: Multilevel degenerative disc disease, most pronounced at C3-C4, C4-C5, C5-C6 and C6-C7. Moderate multilevel facet  arthropathy bilaterally. Upper chest: Unremarkable. Other: There are no aggressive appearing lytic or blastic lesions noted in the visualized portions of the skeleton. IMPRESSION: 1. No evidence of significant acute traumatic injury to the skull, brain or cervical spine. 2. The appearance of the brain is normal for age. 3. Multilevel degenerative disc disease and cervical spondylosis, as above. Electronically Signed   By: Trudie Reed M.D.   On: 04/30/2023 06:23   DG Chest Port 1 View Result Date: 04/30/2023 CLINICAL DATA:  82 year old female with possible sepsis. EXAM: PORTABLE CHEST 1 VIEW COMPARISON:  Chest x-ray 09/04/2019. FINDINGS: Lung volumes are normal. No consolidative airspace disease. No pleural effusions. No pneumothorax. No evidence of pulmonary edema. Heart size is mildly enlarged. The patient is rotated to the right on today's exam, resulting in distortion of the mediastinal contours and reduced diagnostic sensitivity and specificity for mediastinal pathology. IMPRESSION: 1. Low lung volumes without radiographic evidence of acute cardiopulmonary disease. 2. Cardiomegaly. Electronically Signed   By: Trudie Reed M.D.   On: 04/30/2023 05:43    EKG: Independently reviewed.  Sinus tachycardia, no acute ST changes.  Assessment/Plan Principal Problem:   Sepsis due to gram-negative UTI [facility]) Active Problems:   UTI (urinary tract infection)  (please populate well all problems here in Problem List. (For example, if patient is on BP meds at home and you resume or decide to hold them, it is a problem that needs to be her. Same for CAD, COPD, HLD and so on)  Severe sepsis Secondary to complicated UTI with hematuria Acute metabolic encephalopathy, resolved -Sepsis evidenced by new onset of fever leukocytosis tachycardia, with signs of endorgan damage of acute metabolic encephalopathy, source of infection is a complicated UTI with hematuria -Continue ceftriaxone -Metabolic  encephalopathy has resolved  IDDM with insulin resistance and hyperglycemia -Continue Lantus 40 units daily -Continue Jardiance and metformin -Add SSI  CAD -No acute concern, continue aspirin Plavix and statin  HTN -BP borderline low, hold off home BP meds  Deconditioning and fall -PT evaluation  DVT prophylaxis: Lovenox Code Status: Full code Family Communication: None at bedside Disposition Plan: Patient is sick with sepsis and complicated UTI requiring IV antibiotics, expect more than 2 midnight hospital Consults called: None Admission status: Telemetry admission   Emeline General MD [facility] Pager [phone]  04/30/2023, 9:26 AM

## 2023-04-30 NOTE — ED Notes (Signed)
This RN called pt son Genevie Cheshire, with pt on the phone for an update. Pt phone charger in her bag and pt has her phone. Pt denies other needs at this time

## 2023-04-30 NOTE — ED Notes (Addendum)
Pt changed and linens changed. Non skid socks on, call light in reach, bed locked and low, bed alarm on

## 2023-04-30 NOTE — ED Provider Notes (Signed)
[facility] Provider Note    Event Date/Time   First MD Initiated Contact with Patient 04/30/23 [phone]     (approximate)   History   Altered mental status   HPI  Cynthia Singh is a 82 y.o. female  who presents to the emergency department today via EMS because of concern for altered mental status.  States that they found patient on the ground in the living room.  Patient is normally awake alert and oriented.  Patient herself is unable to give any history.  Family apparently is concerned patient has urinary tract infection.     Physical Exam   Triage Vital Signs: ED Triage Vitals  Encounter Vitals Group     BP 04/30/23 0448 (!) 137/99     Systolic BP Percentile --      Diastolic BP Percentile --      Pulse Rate 04/30/23 0448 (!) 115     Resp 04/30/23 0438 20     Temp 04/30/23 0438 (!) 103 F (39.4 C)     Temp Source 04/30/23 0438 Rectal     SpO2 04/30/23 0448 94 %     Weight --      Height --      Head Circumference --      Peak Flow --      Pain Score --      Pain Loc --      Pain Education --      Exclude from Growth Chart --     Most recent vital signs: Vitals:   04/30/23 0438 04/30/23 0448  BP:  (!) 137/99  Pulse:  (!) 115  Resp: 20   Temp: (!) 103 F (39.4 C)   SpO2:  94%   General: Awake, alert, not oriented. CV:  Good peripheral perfusion. Tachycardia. Resp:  Normal effort. Lungs clear. Abd:  No distention.   ED Results / Procedures / Treatments   Labs (all labs ordered are listed, but only abnormal results are displayed) Labs Reviewed  LACTIC ACID, PLASMA - Abnormal; Notable for the following components:      Result Value   Lactic Acid, Venous 2.1 (*)    All other components within normal limits  COMPREHENSIVE METABOLIC PANEL - Abnormal; Notable for the following components:   Sodium 132 (*)    Glucose, Bld 236 (*)    Calcium 8.8 (*)    All other components within normal limits  CBC WITH DIFFERENTIAL/PLATELET -  Abnormal; Notable for the following components:   WBC 18.9 (*)    Neutro Abs 15.9 (*)    Monocytes Absolute 1.3 (*)    Abs Immature Granulocytes 0.57 (*)    All other components within normal limits  URINALYSIS, W/ REFLEX TO CULTURE (INFECTION SUSPECTED) - Abnormal; Notable for the following components:   Color, Urine YELLOW (*)    APPearance CLOUDY (*)    Glucose, UA >=500 (*)    Hgb urine dipstick LARGE (*)    Protein, ur 30 (*)    Leukocytes,Ua MODERATE (*)    Bacteria, UA MANY (*)    All other components within normal limits  CULTURE, BLOOD (ROUTINE X 2)  CULTURE, BLOOD (ROUTINE X 2)  LACTIC ACID, PLASMA  LACTIC ACID, PLASMA     EKG  I, Phineas Semen, attending physician, personally viewed and interpreted this EKG  EKG Time: 0439 Rate: 116 Rhythm: sinus tachycardia Axis: normal Intervals: qtc 434 QRS: LVH ST changes: no st elevation Impression: abnormal ekg  RADIOLOGY I independently interpreted and visualized the CXR. My interpretation: No pneumonia Radiology interpretation:  IMPRESSION:  1. Low lung volumes without radiographic evidence of acute  cardiopulmonary disease.  2. Cardiomegaly.   I independently interpreted and visualized the CT head/cervical spine. My interpretation: No ICH. No fracture Radiology interpretation:  IMPRESSION:  1. No evidence of significant acute traumatic injury to the skull,  brain or cervical spine.  2. The appearance of the brain is normal for age.  3. Multilevel degenerative disc disease and cervical spondylosis, as  above.     PROCEDURES:  Critical Care performed: Yes  CRITICAL CARE Performed by: Phineas Semen   Total critical care time: 35 minutes  Critical care time was exclusive of separately billable procedures and treating other patients.  Critical care was necessary to treat or prevent imminent or life-threatening deterioration.  Critical care was time spent personally by me on the following  activities: development of treatment plan with patient and/or surrogate as well as nursing, discussions with consultants, evaluation of patient's response to treatment, examination of patient, obtaining history from patient or surrogate, ordering and performing treatments and interventions, ordering and review of laboratory studies, ordering and review of radiographic studies, pulse oximetry and re-evaluation of patient's condition.   Procedures    MEDICATIONS ORDERED IN ED: Medications  vancomycin (VANCOCIN) IVPB 1000 mg/200 mL premix (has no administration in time range)  ceFEPIme (MAXIPIME) 2 g in sodium chloride 0.9 % 100 mL IVPB (2 g Intravenous New Bag/Given 04/30/23 0500)  metroNIDAZOLE (FLAGYL) IVPB 500 mg (has no administration in time range)  acetaminophen (TYLENOL) suppository 650 mg (650 mg Rectal Given 04/30/23 0504)     IMPRESSION / MDM / ASSESSMENT AND PLAN / ED COURSE  I reviewed the triage vital signs and the nursing notes.                              Differential diagnosis includes, but is not limited to, UTI, pneumonia, bacteremia, ICH  Patient's presentation is most consistent with acute presentation with potential threat to life or bodily function.   The patient is on the cardiac monitor to evaluate for evidence of arrhythmia and/or significant heart rate changes.  Patient presented to the emergency department today because of concerns for altered mental status.  Patient was found to be tachycardic and febrile upon arrival.  Patient was started on multiple IV antibiotics.  Workup was initiated.  Urine is concerning for infection.  Patient did have elevated white blood cell count and lactic acid level in her serum.  Discussed with Dr. Arville Care with the hospitalist service who will evaluate for admission.    FINAL CLINICAL IMPRESSION(S) / ED DIAGNOSES   Final diagnoses:  Lower urinary tract infectious disease  Altered mental status, unspecified altered mental status  type      Note:  This document was prepared using Dragon voice recognition software and may include unintentional dictation errors.    Phineas Semen, MD 04/30/23 [phone]

## 2023-04-30 NOTE — Evaluation (Signed)
Occupational Therapy Evaluation Patient Details Name: Cynthia Singh MRN: [id number] DOB: [birth year] Today's Date: 04/30/2023   History of Present Illness 82 y.o. female with medical history significant of CAD s/p stenting, IDDM with insulin resistance, HTN, morbid obesity, PVD, presented with UTI-like symptoms and confusion after fall at home. Being treated for sepsis and UTI.   Clinical Impression   Pt was seen for OT evaluation this date. Prior to hospital admission, pt lives at home with her 2 sons. Reports family works, but she is not left alone for long periods of time. Ind with mobility at baseline using quad cane or rollator interchangeably. Reports IND with ADLs and light cooking/cleaning. Sons drive her to appts.  Pt presents to acute OT demonstrating impaired ADL performance and functional mobility 2/2 weakness, chronic pain, balance deficits and some confusion (See OT problem list for additional functional deficits). Pt fully oriented during evaluation. Pain 2/10 to lower abdomen and 7/10 to BLEs with standing/lateral steps. Reports of feeling lightheaded upon sitting/standing, however BP remained WFL with no drop noted. CGA/SUP for bed mobility and CGA for STS and lateral steps towards HOB. Further mobility deferred d/t chronic BLE pain and pt's increased output being attached to purewick in room, as well as reports of lightheadedness. Pt would benefit from skilled OT services to address noted impairments and functional limitations (see below for any additional details) in order to maximize safety and independence while minimizing falls risk and caregiver burden. Do anticipate the need for follow up OT services upon acute hospital DC-may be able to return home with [facility] services d/t strong family support at home.        If plan is discharge home, recommend the following: A little help with walking and/or transfers;A little help with bathing/dressing/bathroom;Help with stairs or ramp  for entrance;Assist for transportation;Assistance with cooking/housework    Functional Status Assessment  Patient has had a recent decline in their functional status and demonstrates the ability to make significant improvements in function in a reasonable and predictable amount of time.  Equipment Recommendations  None recommended by OT    Recommendations for Other Services       Precautions / Restrictions Precautions Precautions: Fall Restrictions Weight Bearing Restrictions Per Provider Order: No      Mobility Bed Mobility Overal bed mobility: Needs Assistance Bed Mobility: Supine to Sit, Sit to Supine     Supine to sit: Contact guard, Supervision, HOB elevated Sit to supine: Supervision, Contact guard assist   General bed mobility comments: CGA to SUP for bed mobility on high gurney    Transfers Overall transfer level: Needs assistance Equipment used: Rolling walker (2 wheels) Transfers: Sit to/from Stand Sit to Stand: Contact guard assist, From elevated surface           General transfer comment: CGA for STS from high gurney to RW and able to take lateral steps with RW to [facility], deferred further mobility d/t pt reports of swimmy headed and being connected to purewick d/t high output as well as increased BLE pain      Balance Overall balance assessment: Needs assistance Sitting-balance support: No upper extremity supported, Feet supported Sitting balance-Leahy Scale: Good Sitting balance - Comments: steady seated balance although mentions she is swimmy headed   Standing balance support: Bilateral upper extremity supported, During functional activity, Reliant on assistive device for balance Standing balance-Leahy Scale: Fair Standing balance comment: no LOB during standing or lateral steps with RW  ADL either performed or assessed with clinical judgement   ADL Overall ADL's : Needs assistance/impaired Eating/Feeding: Set  up;Bed level                       Toilet Transfer: Contact guard assist;Rolling walker (2 wheels) Toilet Transfer Details (indicate cue type and reason): simulated via stand and lateral steps at [facility]         Functional mobility during ADLs: Contact guard assist;Rolling walker (2 wheels)       Vision         Perception         Praxis         Pertinent Vitals/Pain Pain Assessment Pain Assessment: 0-10 Pain Score: 7  Pain Location: 2/10 in lower abdomen, 7/10 in BLEs with standing Pain Descriptors / Indicators: Discomfort Pain Intervention(s): Monitored during session     Extremity/Trunk Assessment Upper Extremity Assessment Upper Extremity Assessment: Generalized weakness   Lower Extremity Assessment Lower Extremity Assessment: Generalized weakness   Cervical / Trunk Assessment Cervical / Trunk Assessment:  (froward head and shoulders)   Communication Communication Communication: No apparent difficulties Cueing Techniques: Verbal cues   Cognition Arousal: Alert Behavior During Therapy: WFL for tasks assessed/performed Overall Cognitive Status: Within Functional Limits for tasks assessed                                       General Comments       Exercises Other Exercises Other Exercises: Edu on role of OT in acute setting and importance of therapy to maximize strength and safety to return home at [facility].   Shoulder Instructions      Home Living Family/patient expects to be discharged to:: Private residence Living Arrangements: Children (2 sons and son's girlfriend) Available Help at Discharge: Family;Available PRN/intermittently (all work during the day, pt reports not alone much) Type of Home: House Home Access: Stairs to enter [employer] of Steps: 4 STE to porch then 1 ste house Entrance Stairs-Rails: Right Home Layout: One level     Bathroom Shower/Tub: Tub/shower unit;Sponge bathes at baseline    [employer]: Handicapped height     Home Equipment: Grab bars - tub/shower;Tub bench;Rolling Walker (2 wheels);Rollator (4 wheels);Cane - quad          Prior Functioning/Environment Prior Level of Function : History of Falls (last six months);Independent/Modified Independent             Mobility Comments: uses both rollator and QC interchangeably per report both household distances and community; does not drive; sons take to MD appts; 2 falls in the last 6 months including one last night that brought her in ADLs Comments: IND with ADLs, some light cooking, cleaning        OT Problem List: Decreased strength;Pain;Impaired balance (sitting and/or standing);Decreased activity tolerance      OT Treatment/Interventions: Self-care/ADL training;Therapeutic exercise;Therapeutic activities;Energy conservation;Patient/family education;DME and/or AE instruction;Balance training    OT Goals(Current goals can be found in the care plan section) Acute Rehab OT Goals Patient Stated Goal: return home OT Goal Formulation: With patient Time For Goal Achievement: 05/14/23 Potential to Achieve Goals: Good ADL Goals Pt Will Perform Lower Body Bathing: with supervision;sitting/lateral leans;sit to/from stand Pt Will Perform Lower Body Dressing: with supervision;sit to/from stand;sitting/lateral leans Pt Will Transfer to Toilet: with supervision;ambulating;regular height toilet Pt Will Perform Toileting - Clothing Manipulation and hygiene: with  supervision;sit to/from stand;sitting/lateral leans  OT Frequency: Min 1X/week    Co-evaluation              AM-PAC OT "6 Clicks" Daily Activity     Outcome Measure Help from another person eating meals?: None Help from another person taking care of personal grooming?: None Help from another person toileting, which includes using toliet, bedpan, or urinal?: A Little Help from another person bathing (including washing, rinsing, drying)?: A  Little Help from another person to put on and taking off regular upper body clothing?: None Help from another person to put on and taking off regular lower body clothing?: A Little 6 Click Score: 21   End of Session Equipment Utilized During Treatment: Rolling walker (2 wheels)  Activity Tolerance: Patient tolerated treatment well Patient left: in bed;with call bell/phone within reach;with bed alarm set  OT Visit Diagnosis: Unsteadiness on feet (R26.81);Other abnormalities of gait and mobility (R26.89);Muscle weakness (generalized) (M62.81);History of falling (Z91.81)                Time: 0454-0981 OT Time Calculation (min): 26 min Charges:  OT General Charges $OT Visit: 1 Visit OT Evaluation $OT Eval Moderate Complexity: 1 Mod OT Treatments $Therapeutic Activity: 8-22 mins  Danya Spearman, OTR/L 04/30/23, 3:44 PM   Daxter Paule E Yosselin Zoeller 04/30/2023, 3:41 PM

## 2023-04-30 NOTE — ED Triage Notes (Incomplete)
Family states they woke up and found patient on the floor in the living room. At baseline she is A&O x 4. On EMS arrival pt is confused and A&O x 2. Initial FSBS 226 mildly hypotensive. Family is concerned for potential UTI. Family are poor historians of medications and medical history

## 2023-05-01 ENCOUNTER — Encounter: Payer: Self-pay | Admitting: Internal Medicine

## 2023-05-01 ENCOUNTER — Other Ambulatory Visit: Payer: Self-pay

## 2023-05-01 DIAGNOSIS — W19XXXA Unspecified fall, initial encounter: Secondary | ICD-10-CM

## 2023-05-01 DIAGNOSIS — Z794 Long term (current) use of insulin: Secondary | ICD-10-CM

## 2023-05-01 DIAGNOSIS — N3001 Acute cystitis with hematuria: Secondary | ICD-10-CM | POA: Diagnosis not present

## 2023-05-01 DIAGNOSIS — E119 Type 2 diabetes mellitus without complications: Secondary | ICD-10-CM

## 2023-05-01 DIAGNOSIS — I10 Essential (primary) hypertension: Secondary | ICD-10-CM | POA: Diagnosis not present

## 2023-05-01 DIAGNOSIS — E785 Hyperlipidemia, unspecified: Secondary | ICD-10-CM

## 2023-05-01 DIAGNOSIS — Y92009 Unspecified place in unspecified non-institutional (private) residence as the place of occurrence of the external cause: Secondary | ICD-10-CM

## 2023-05-01 DIAGNOSIS — A4150 Gram-negative sepsis, unspecified: Secondary | ICD-10-CM | POA: Diagnosis not present

## 2023-05-01 LAB — BASIC METABOLIC PANEL
Anion gap: 10 (ref 5–15)
BUN: 19 mg/dL (ref 8–23)
CO2: 22 mmol/L (ref 22–32)
Calcium: 9 mg/dL (ref 8.9–10.3)
Chloride: 105 mmol/L (ref 98–111)
Creatinine, Ser: 0.9 mg/dL (ref 0.44–1.00)
GFR, Estimated: >60 mL/min (ref >60)
Glucose, Bld: 124 mg/dL — ABNORMAL HIGH (ref 70–99)
Potassium: 3.4 mmol/L — ABNORMAL LOW (ref 3.5–5.1)
Sodium: 137 mmol/L (ref 135–145)

## 2023-05-01 LAB — BLOOD CULTURE ID PANEL (REFLEXED) - BCID2

## 2023-05-01 LAB — CBC
HCT: 38.5 % (ref 36.0–46.0)
Hemoglobin: 12.4 g/dL (ref 12.0–15.0)
MCH: 28.1 pg (ref 26.0–34.0)
MCHC: 32.2 g/dL (ref 30.0–36.0)
MCV: 87.3 fL (ref 80.0–100.0)
Platelets: 272 10*3/uL (ref 150–400)
RBC: 4.41 MIL/uL (ref 3.87–5.11)
RDW: 13.2 % (ref 11.5–15.5)
WBC: 14.3 10*3/uL — ABNORMAL HIGH (ref 4.0–10.5)
nRBC: 0 % (ref 0.0–0.2)

## 2023-05-01 LAB — CULTURE, BLOOD (ROUTINE X 2)

## 2023-05-01 LAB — MAGNESIUM: Magnesium: 2 mg/dL (ref 1.7–2.4)

## 2023-05-01 LAB — GLUCOSE, CAPILLARY
Glucose-Capillary: 101 mg/dL — ABNORMAL HIGH (ref 70–99)
Glucose-Capillary: 161 mg/dL — ABNORMAL HIGH (ref 70–99)

## 2023-05-01 LAB — CBG MONITORING, ED
Glucose-Capillary: 123 mg/dL — ABNORMAL HIGH (ref 70–99)
Glucose-Capillary: 117 mg/dL — ABNORMAL HIGH (ref 70–99)

## 2023-05-01 MED ORDER — POTASSIUM CHLORIDE CRYS ER 20 MEQ PO TBCR
40.0000 meq | EXTENDED_RELEASE_TABLET | Freq: Once | ORAL | Status: AC
  Administered 2023-05-01: 40 meq via ORAL
  Filled 2023-05-01: qty 2

## 2023-05-01 MED ORDER — INFLUENZA VAC A&B SURF ANT ADJ 0.5 ML IM SUSY
0.5000 mL | PREFILLED_SYRINGE | INTRAMUSCULAR | Status: DC
  Filled 2023-05-01: qty 0.5

## 2023-05-01 MED ORDER — CLOPIDOGREL BISULFATE 75 MG PO TABS
75.0000 mg | ORAL_TABLET | Freq: Every day | ORAL | Status: DC
  Administered 2023-05-01 – 2023-05-03 (×3): 75 mg via ORAL
  Filled 2023-05-01 (×3): qty 1

## 2023-05-01 NOTE — Assessment & Plan Note (Signed)
Please see above

## 2023-05-01 NOTE — Plan of Care (Signed)
  Problem: Education: Goal: Ability to describe self-care measures that may prevent or decrease complications (Diabetes Survival Skills Education) will improve Outcome: Progressing   

## 2023-05-01 NOTE — Assessment & Plan Note (Signed)
Blood pressure currently within goal. -Continue low-dose amlodipine -Continue to monitor

## 2023-05-01 NOTE — Progress Notes (Signed)
  Progress Note   Patient: Cynthia Singh NWG:[id number] DOB: [birth year] DOA: 04/30/2023     1 DOS: the patient was seen and examined on 05/01/2023   Brief hospital course: Taken from H&P.   PANAYIOTA BRANDNER is a 82 y.o. female with medical history significant of CAD status post stenting, IDDM with insulin resistance, HTN, morbid obesity, PVD, presented with UTI-like symptoms and confusion.   On presentation febrile at 103, tachycardic 115, UA with concern of UTI and hematuria, leukocytosis at 18.5, lactic acid 2.1, bicarb 22.  Patient received IV bolus and started on ceftriaxone.  12/14: Afebrile this morning with elevated blood pressure at 170/79, blood cultures growing E. Coli.  Urine cultures ordered as add-on.  Improving leukocytosis at 14.3, lactic acidosis resolved.  Mild hypokalemia with potassium at 3.4, magnesium at 2, potassium is being replaced.  PT recommending home health.    Assessment and Plan: * Sepsis due to gram-negative UTI [facility]) Patient met sepsis criteria with fever, leukocytosis and tachycardia.  Severe sepsis with acute metabolic encephalopathy. Blood culture positive for E. coli-pending susceptibility.  Pending urine cultures. Likely secondary to UTI -Continue with ceftriaxone -Follow-up final culture results  UTI (urinary tract infection) -Please see above  Diabetes mellitus, type 2 (HCC) Patient was on Jardiance and metformin along with Lantus at home which was continued. -SSI was also added  Hypertension Blood pressure currently within goal. -Continue low-dose amlodipine -Continue to monitor  CAD (coronary artery disease) No acute concern. -Continue home aspirin, Plavix and statin  Dyslipidemia -Continue statin  Fall at home, initial encounter PT evaluation was obtained and they were recommending home health services   Subjective: Patient was still feeling weak when seen today.  Orientation much improved.  Physical Exam: Vitals:    05/01/23 1115 05/01/23 1245 05/01/23 1252 05/01/23 1300  BP:   (!) 126/58 (!) 120/55  Pulse: 79 77  77  Resp: 15 15  16   Temp:      TempSrc:      SpO2: 93% 93%  92%  Weight:      Height:       General.  Frail elderly lady, in no acute distress. Pulmonary.  Lungs clear bilaterally, normal respiratory effort. CV.  Regular rate and rhythm, no JVD, rub or murmur. Abdomen.  Soft, nontender, nondistended, BS positive. CNS.  Alert and oriented .  No focal neurologic deficit. Extremities.  No edema, no cyanosis, pulses intact and symmetrical. Psychiatry.  Judgment and insight appears normal.   Data Reviewed: Prior data reviewed  Family Communication: Called grandson, only contact listed with no response  Disposition: Status is: Inpatient Remains inpatient appropriate because: Severity of illness  Planned Discharge Destination: Home with Home Health  DVT prophylaxis.  Lovenox Time spent: 50 minutes  This record has been created using Conservation officer, historic buildings. Errors have been sought and corrected,but may not always be located. Such creation errors do not reflect on the standard of care.   Author: Arnetha Courser, MD 05/01/2023 1:29 PM  For on call review [email or web address].

## 2023-05-01 NOTE — Evaluation (Signed)
Physical Therapy Evaluation Patient Details Name: Cynthia Singh MRN: [id number] DOB: [birth year] Today's Date: 05/01/2023  History of Present Illness  82 y.o. female with medical history significant of CAD s/p stenting, IDDM with insulin resistance, HTN, morbid obesity, PVD, presented with UTI-like symptoms and confusion after fall at home. Being treated for sepsis and UTI.  Clinical Impression  Patient resting in bed upon arrival to room; alert and oriented, follows commands and agreeable to participation with session.  Endorses chronic pain in low back area; meds on board as appropriate.  Bilat UE/LE strength and ROM grossly symmetrical and WFL for basic transfers and mobility (with isolated testing); does demonstrate some functional, closed-chain weakness to R LE with gait efforts (which patient attributes to chronic back issues). Able to complete bed mobility with mod indep; sit/stand, basic transfers and gait (40') with RW, cga/close sup. Demonstrates forward flexed, kyphotic posture; partially recipropcal stepping pattern with fair step height/length; mildly antalgic due to functional R LE weakness. No overt buckling or LOB; moderate reliace on RW. Vitals stable and WFL with transition to upright and mobility efforts this date; minimal/no reports of dizziness throughout session. Would benefit from skilled PT to address above deficits and promote optimal return to PLOF.; recommend post-acute PT follow up as indicated by interdisciplinary care team.            If plan is discharge home, recommend the following: A little help with walking and/or transfers;A little help with bathing/dressing/bathroom   Can travel by private vehicle        Equipment Recommendations  (has equipment needed)  Recommendations for Other Services       Functional Status Assessment Patient has had a recent decline in their functional status and demonstrates the ability to make significant improvements in  function in a reasonable and predictable amount of time.     Precautions / Restrictions Precautions Precautions: Fall Restrictions Weight Bearing Restrictions Per Provider Order: No      Mobility  Bed Mobility Overal bed mobility: Needs Assistance Bed Mobility: Supine to Sit, Sit to Supine     Supine to sit: Supervision, Modified independent (Device/Increase time) Sit to supine: Supervision, Modified independent (Device/Increase time)        Transfers Overall transfer level: Needs assistance Equipment used: Rolling walker (2 wheels) Transfers: Sit to/from Stand Sit to Stand: Supervision, Contact guard assist           General transfer comment: cuing for hand placement    Ambulation/Gait Ambulation/Gait assistance: Supervision, Contact guard assist Gait Distance (Feet): 40 Feet Assistive device: Rolling walker (2 wheels)         General Gait Details: forward flexed, kyphotic posture; partially recipropcal stepping pattern with fair step height/length; mildly antalgic due to functional R LE weakness. No overt buckling or LOB; moderate reliace on RW  Stairs            Wheelchair Mobility     Tilt Bed    Modified Rankin (Stroke Patients Only)       Balance Overall balance assessment: Needs assistance Sitting-balance support: No upper extremity supported, Feet supported Sitting balance-Leahy Scale: Good     Standing balance support: Bilateral upper extremity supported Standing balance-Leahy Scale: Fair                               Pertinent Vitals/Pain Pain Assessment Pain Assessment: Faces Faces Pain Scale: Hurts a little bit Pain Location: low back  Pain Descriptors / Indicators: Discomfort Pain Intervention(s): Limited activity within patient's tolerance, Monitored during session, Repositioned    Home Living Family/patient expects to be discharged to:: Private residence Living Arrangements: Children Available Help at  Discharge: Family;Available PRN/intermittently Type of Home: House Home Access: Stairs to enter Entrance Stairs-Rails: Right Entrance Stairs-Number of Steps: 4 STE to porch then 1 ste house   Home Layout: One level Home Equipment: Grab bars - tub/shower;Tub bench;Rolling Walker (2 wheels);Rollator (4 wheels);Cane - quad      Prior Function Prior Level of Function : History of Falls (last six months);Independent/Modified Independent             Mobility Comments: uses both rollator and QC interchangeably per report, household distances (minimal out of house mobility); does not drive; sons take to MD appts; 2 falls in the last 6 months including one last night that brought her in       Extremity/Trunk Assessment   Upper Extremity Assessment Upper Extremity Assessment: Overall WFL for tasks assessed    Lower Extremity Assessment Lower Extremity Assessment: Generalized weakness (grossly at least 4-/5 throughout; some functional weakness noted to R LE with gait efforts)       Communication   Communication Communication: No apparent difficulties  Cognition Arousal: Alert Behavior During Therapy: WFL for tasks assessed/performed Overall Cognitive Status: Within Functional Limits for tasks assessed                                          General Comments      Exercises     Assessment/Plan    PT Assessment Patient needs continued PT services  PT Problem List Decreased activity tolerance;Decreased balance;Decreased mobility;Decreased coordination;Decreased cognition;Decreased knowledge of use of DME;Decreased safety awareness       PT Treatment Interventions DME instruction;Gait training;Stair training;Functional mobility training;Therapeutic activities;Therapeutic exercise;Balance training;Patient/family education    PT Goals (Current goals can be found in the Care Plan section)  Acute Rehab PT Goals Patient Stated Goal: to get stonger PT Goal  Formulation: With patient Time For Goal Achievement: 05/15/23 Potential to Achieve Goals: Good    Frequency Min 1X/week     Co-evaluation               AM-PAC PT "6 Clicks" Mobility  Outcome Measure Help needed turning from your back to your side while in a flat bed without using bedrails?: None Help needed moving from lying on your back to sitting on the side of a flat bed without using bedrails?: None Help needed moving to and from a bed to a chair (including a wheelchair)?: A Little Help needed standing up from a chair using your arms (e.g., wheelchair or bedside chair)?: A Little Help needed to walk in hospital room?: A Little Help needed climbing 3-5 steps with a railing? : A Little 6 Click Score: 20    End of Session Equipment Utilized During Treatment: Gait belt Activity Tolerance: Patient tolerated treatment well Patient left: in bed;with call bell/phone within reach Nurse Communication: Mobility status PT Visit Diagnosis: Muscle weakness (generalized) (M62.81);Difficulty in walking, not elsewhere classified (R26.2)    Time: 8119-1478 PT Time Calculation (min) (ACUTE ONLY): 13 min   Charges:   PT Evaluation $PT Eval Low Complexity: 1 Low   PT General Charges $$ ACUTE PT VISIT: 1 Visit        Jamichael Knotts H. Manson Passey, PT, DPT, NCS  05/01/23, 12:27 PM [phone]

## 2023-05-01 NOTE — Hospital Course (Addendum)
Taken from H&P.   Cynthia Singh is a 82 y.o. female with medical history significant of CAD status post stenting, IDDM with insulin resistance, HTN, morbid obesity, PVD, presented with UTI-like symptoms and confusion.   On presentation febrile at 103, tachycardic 115, UA with concern of UTI and hematuria, leukocytosis at 18.5, lactic acid 2.1, bicarb 22.  Patient received IV bolus and started on ceftriaxone.  12/14: Afebrile this morning with elevated blood pressure at 170/79, blood cultures growing E. Coli.  Urine cultures ordered as add-on.  Improving leukocytosis at 14.3, lactic acidosis resolved.  Mild hypokalemia with potassium at 3.4, magnesium at 2, potassium is being replaced.  PT recommending home health.  12/15: Hemodynamically stable, Urine cultures also growing E. Coli-pending susceptibility. we will continue with IV antibiotics for another day   12/16: Remained hemodynamically stable.  Received susceptibility results and based on that she will be taking Bactrim for 4 more days. Ordered home health services as advised by our PT. Patient was also given a new prescription for amlodipine at 10 mg daily as blood pressure remained elevated, she has apparently stopped taking her home amlodipine which was dosed as 2.5 mg daily.  Patient need to follow-up closely with primary care provider for further management of her hypertension.  Patient will continue with rest of her home medications as she was doing it before and follow-up with her doctor.

## 2023-05-01 NOTE — ED Notes (Signed)
Lunch meal tray given at this time.  

## 2023-05-01 NOTE — ED Notes (Signed)
Breakfast meal tray given to patient at this time.

## 2023-05-01 NOTE — Assessment & Plan Note (Signed)
PT evaluation was obtained and they were recommending home health services

## 2023-05-01 NOTE — ED Notes (Signed)
Report received from Kim,RN.

## 2023-05-01 NOTE — Progress Notes (Signed)
PHARMACY - PHYSICIAN COMMUNICATION CRITICAL VALUE ALERT - BLOOD CULTURE IDENTIFICATION (BCID)  Results for orders placed or performed during the hospital encounter of 04/30/23  Blood Culture (routine x 2)     Status: None (Preliminary result)   Collection Time: 04/30/23  4:49 AM   Specimen: BLOOD  Result Value Ref Range Status   Specimen Description BLOOD RIGHT FOREARM  Final   Special Requests   Final    BOTTLES DRAWN AEROBIC AND ANAEROBIC Blood Culture adequate volume   Culture  Setup Time   Final    GRAM NEGATIVE RODS IN BOTH AEROBIC AND ANAEROBIC BOTTLES Organism ID to follow CRITICAL RESULT CALLED TO, READ BACK BY AND VERIFIED WITH: Joleen Stuckert BLUE AT 05/01/23 0048 BY AB Performed at [facility], [address] [place], [place] [address]    Culture GRAM NEGATIVE RODS  Final   Report Status PENDING  Incomplete  Blood Culture (routine x 2)     Status: None (Preliminary result)   Collection Time: 04/30/23  4:49 AM   Specimen: BLOOD  Result Value Ref Range Status   Specimen Description BLOOD LEFT FOREARM  Final   Special Requests   Final    BOTTLES DRAWN AEROBIC AND ANAEROBIC Blood Culture adequate volume   Culture  Setup Time PENDING  Incomplete   Culture   Final    NO GROWTH <12 HOURS Performed at [facility], [address] [place], [place] [address]    Report Status PENDING  Incomplete  Blood Culture ID Panel (Reflexed)     Status: Abnormal   Collection Time: 04/30/23  4:49 AM  Result Value Ref Range Status   Enterococcus faecalis NOT DETECTED NOT DETECTED Final   Enterococcus Faecium NOT DETECTED NOT DETECTED Final   Listeria monocytogenes NOT DETECTED NOT DETECTED Final   Staphylococcus species NOT DETECTED NOT DETECTED Final   Staphylococcus aureus (BCID) NOT DETECTED NOT DETECTED Final   Staphylococcus epidermidis NOT DETECTED NOT DETECTED Final   Staphylococcus lugdunensis NOT DETECTED NOT DETECTED Final   Streptococcus species NOT DETECTED  NOT DETECTED Final   Streptococcus agalactiae NOT DETECTED NOT DETECTED Final   Streptococcus pneumoniae NOT DETECTED NOT DETECTED Final   Streptococcus pyogenes NOT DETECTED NOT DETECTED Final   A.calcoaceticus-baumannii NOT DETECTED NOT DETECTED Final   Bacteroides fragilis NOT DETECTED NOT DETECTED Final   Enterobacterales DETECTED (A) NOT DETECTED Final    Comment: Enterobacterales represent a large order of gram negative bacteria, not a single organism. CRITICAL RESULT CALLED TO, READ BACK BY AND VERIFIED WITH: Maurisha Mongeau BLUE AT 05/01/23 0048 BY AB    Enterobacter cloacae complex NOT DETECTED NOT DETECTED Final   Escherichia coli DETECTED (A) NOT DETECTED Final    Comment: CRITICAL RESULT CALLED TO, READ BACK BY AND VERIFIED WITH: Ailee Pates BLUE AT 05/01/23 0048 BY AB    Klebsiella aerogenes NOT DETECTED NOT DETECTED Final   Klebsiella oxytoca NOT DETECTED NOT DETECTED Final   Klebsiella pneumoniae NOT DETECTED NOT DETECTED Final   Proteus species NOT DETECTED NOT DETECTED Final   Salmonella species NOT DETECTED NOT DETECTED Final   Serratia marcescens NOT DETECTED NOT DETECTED Final   Haemophilus influenzae NOT DETECTED NOT DETECTED Final   Neisseria meningitidis NOT DETECTED NOT DETECTED Final   Pseudomonas aeruginosa NOT DETECTED NOT DETECTED Final   Stenotrophomonas maltophilia NOT DETECTED NOT DETECTED Final   Candida albicans NOT DETECTED NOT DETECTED Final   Candida auris NOT DETECTED NOT DETECTED Final   Candida glabrata NOT DETECTED NOT  DETECTED Final   Candida krusei NOT DETECTED NOT DETECTED Final   Candida parapsilosis NOT DETECTED NOT DETECTED Final   Candida tropicalis NOT DETECTED NOT DETECTED Final   Cryptococcus neoformans/gattii NOT DETECTED NOT DETECTED Final   CTX-M ESBL NOT DETECTED NOT DETECTED Final   Carbapenem resistance IMP NOT DETECTED NOT DETECTED Final   Carbapenem resistance KPC NOT DETECTED NOT DETECTED Final   Carbapenem resistance NDM NOT DETECTED  NOT DETECTED Final   Carbapenem resist OXA 48 LIKE NOT DETECTED NOT DETECTED Final   Carbapenem resistance VIM NOT DETECTED NOT DETECTED Final    Comment: Performed at [facility], [address] [place], [place] [address]    BCID Results: 2 (same set) of 4 bottles w/ E. Coli, no resistance.  Pt on Ceftriaxone 2 gm daily.  Name of provider contacted: Cliffton Asters, NP   Changes to prescribed antibiotics required: No changes needed at this time.  Otelia Sergeant, PharmD, MBA 05/01/2023 1:19 AM

## 2023-05-01 NOTE — ED Notes (Signed)
Helped pt walk to toilet. Pt is unsteady on feet and requires assistance. Pt back in bed now.

## 2023-05-01 NOTE — Assessment & Plan Note (Signed)
Patient met sepsis criteria with fever, leukocytosis and tachycardia.  Severe sepsis with acute metabolic encephalopathy. Blood culture positive for E. coli-pending susceptibility.  Pending urine cultures. Likely secondary to UTI -Continue with ceftriaxone -Follow-up final culture results

## 2023-05-01 NOTE — Assessment & Plan Note (Signed)
Continue statin. 

## 2023-05-01 NOTE — Assessment & Plan Note (Signed)
-  No acute concern. -Continue home aspirin, Plavix and statin

## 2023-05-01 NOTE — Assessment & Plan Note (Signed)
Patient was on Jardiance and metformin along with Lantus at home which was continued. -SSI was also added

## 2023-05-02 DIAGNOSIS — A4150 Gram-negative sepsis, unspecified: Secondary | ICD-10-CM | POA: Diagnosis not present

## 2023-05-02 DIAGNOSIS — N3001 Acute cystitis with hematuria: Secondary | ICD-10-CM | POA: Diagnosis not present

## 2023-05-02 DIAGNOSIS — I10 Essential (primary) hypertension: Secondary | ICD-10-CM | POA: Diagnosis not present

## 2023-05-02 DIAGNOSIS — E119 Type 2 diabetes mellitus without complications: Secondary | ICD-10-CM | POA: Diagnosis not present

## 2023-05-02 LAB — GLUCOSE, CAPILLARY
Glucose-Capillary: 87 mg/dL (ref 70–99)
Glucose-Capillary: 145 mg/dL — ABNORMAL HIGH (ref 70–99)
Glucose-Capillary: 99 mg/dL (ref 70–99)
Glucose-Capillary: 100 mg/dL — ABNORMAL HIGH (ref 70–99)

## 2023-05-02 LAB — CBC
HCT: 37.3 % (ref 36.0–46.0)
Hemoglobin: 12.2 g/dL (ref 12.0–15.0)
MCH: 28 pg (ref 26.0–34.0)
MCHC: 32.7 g/dL (ref 30.0–36.0)
MCV: 85.7 fL (ref 80.0–100.0)
Platelets: 276 10*3/uL (ref 150–400)
RBC: 4.35 MIL/uL (ref 3.87–5.11)
RDW: 13.2 % (ref 11.5–15.5)
WBC: 12.2 10*3/uL — ABNORMAL HIGH (ref 4.0–10.5)
nRBC: 0 % (ref 0.0–0.2)

## 2023-05-02 LAB — BASIC METABOLIC PANEL
Anion gap: 10 (ref 5–15)
BUN: 17 mg/dL (ref 8–23)
CO2: 24 mmol/L (ref 22–32)
Calcium: 8.4 mg/dL — ABNORMAL LOW (ref 8.9–10.3)
Chloride: 104 mmol/L (ref 98–111)
Creatinine, Ser: 0.89 mg/dL (ref 0.44–1.00)
GFR, Estimated: >60 mL/min (ref >60)
Glucose, Bld: 91 mg/dL (ref 70–99)
Potassium: 4.1 mmol/L (ref 3.5–5.1)
Sodium: 138 mmol/L (ref 135–145)

## 2023-05-02 MED ORDER — AMLODIPINE BESYLATE 5 MG PO TABS
5.0000 mg | ORAL_TABLET | Freq: Every day | ORAL | Status: DC
  Administered 2023-05-02: 5 mg via ORAL
  Filled 2023-05-02: qty 1

## 2023-05-02 MED ORDER — GUAIFENESIN-DM 100-10 MG/5ML PO SYRP
5.0000 mL | ORAL_SOLUTION | ORAL | Status: DC | PRN
  Administered 2023-05-03: 5 mL via ORAL
  Filled 2023-05-02: qty 10

## 2023-05-02 MED ORDER — PHENOL 1.4 % MT LIQD
1.0000 | OROMUCOSAL | Status: DC | PRN
  Administered 2023-05-03: 1 via OROMUCOSAL
  Filled 2023-05-02: qty 177

## 2023-05-02 NOTE — Assessment & Plan Note (Signed)
Blood pressure mildly elevated. -Increase the dose of amlodipine to 5 mg daily -Continue to monitor

## 2023-05-02 NOTE — Progress Notes (Signed)
  Progress Note   Patient: Cynthia Singh AOZ:[id number] DOB: 01/11/1941 DOA: 04/30/2023     2 DOS: the patient was seen and examined on 05/02/2023   Brief hospital course: Taken from H&P.   TODD GAMBEL is a 82 y.o. female with medical history significant of CAD status post stenting, IDDM with insulin resistance, HTN, morbid obesity, PVD, presented with UTI-like symptoms and confusion.   On presentation febrile at 103, tachycardic 115, UA with concern of UTI and hematuria, leukocytosis at 18.5, lactic acid 2.1, bicarb 22.  Patient received IV bolus and started on ceftriaxone.  12/14: Afebrile this morning with elevated blood pressure at 170/79, blood cultures growing E. Coli.  Urine cultures ordered as add-on.  Improving leukocytosis at 14.3, lactic acidosis resolved.  Mild hypokalemia with potassium at 3.4, magnesium at 2, potassium is being replaced.  PT recommending home health.  12/15: Hemodynamically stable, Urine cultures also growing E. Coli-pending susceptibility. we will continue with IV antibiotics for another day    Assessment and Plan: * Sepsis due to gram-negative UTI [facility]) Patient met sepsis criteria with fever, leukocytosis and tachycardia.  Severe sepsis with acute metabolic encephalopathy. Blood and urine cultures positive for E. coli-pending susceptibility.  -Continue with ceftriaxone -Follow-up final culture results  UTI (urinary tract infection) -Please see above  Diabetes mellitus, type 2 (HCC) Patient was on Jardiance and metformin along with Lantus at home which was continued. -SSI was also added  Hypertension Blood pressure mildly elevated. -Increase the dose of amlodipine to 5 mg daily -Continue to monitor  CAD (coronary artery disease) No acute concern. -Continue home aspirin, Plavix and statin  Dyslipidemia -Continue statin  Fall at home, initial encounter PT evaluation was obtained and they were recommending home health  services   Subjective: Patient was seen and examined today.  Still feeling weak but stating that she is slowly improving.  She had 1 episode of urinary incontinence.  Physical Exam: Vitals:   05/01/23 1400 05/01/23 1526 05/01/23 2103 05/02/23 0826  BP: (!) 162/88 (!) 162/92 (!) 147/67 (!) 156/75  Pulse: 79 81 80 73  Resp: 15 16 16 18   Temp:  98.4 F (36.9 C) 98.2 F (36.8 C) 97.8 F (36.6 C)  TempSrc:      SpO2: 95% 97% 95% 96%  Weight:      Height:       General.  Frail elderly lady, in no acute distress. Pulmonary.  Lungs clear bilaterally, normal respiratory effort. CV.  Regular rate and rhythm, no JVD, rub or murmur. Abdomen.  Soft, nontender, nondistended, BS positive. CNS.  Alert and oriented .  No focal neurologic deficit. Extremities.  No edema, no cyanosis, pulses intact and symmetrical. Psychiatry.  Judgment and insight appears normal.    Data Reviewed: Prior data reviewed  Family Communication: Called grandson again today with no response.  He is the only contact listed.  Disposition: Status is: Inpatient Remains inpatient appropriate because: Severity of illness  Planned Discharge Destination: Home with Home Health  DVT prophylaxis.  Lovenox Time spent: 45 minutes  This record has been created using Conservation officer, historic buildings. Errors have been sought and corrected,but may not always be located. Such creation errors do not reflect on the standard of care.   Author: Arnetha Courser, MD 05/02/2023 3:19 PM  For on call review [email or web address].

## 2023-05-02 NOTE — TOC Initial Note (Signed)
Transition of Care [facility]) - Initial/Assessment Note    Patient Details  Name: Cynthia Singh MRN: [id number] Date of Birth: 1941/05/04  [facility] CM/SW Contact:    Liliana Cline, LCSW Phone Number: 05/02/2023, 11:53 AM  Clinical Narrative:                 CSW spoke with patient at bedside. Patient is from home with her 2 sons who provide transportation. PCP is Dr. Alphonsus Sias. [facility]. Patient has DME at home including grab bars, tub bench, rolling walker, rolltaor, and cane. Patient denies SNF history. Patient had HH in the past but cannot recall agency used, not listed in [employer]. Patient is agreeable to Home Health. No agency preferences. Referral made to [facility] with [facility] for PT and OT.    Expected Discharge Plan: Home w Home Health Services Barriers to Discharge: Continued Medical Work up   Patient Goals and CMS Choice Patient states their goals for this hospitalization and ongoing recovery are:: home with home health CMS Medicare.gov Compare Post Acute Care list provided to:: Patient Choice offered to / list presented to : Patient      Expected Discharge Plan and Services       Living arrangements for the past 2 months: Single Family Home                           HH Arranged: PT, OT HH Agency: [facility] Date [facility] Agency Contacted: 05/02/23   Representative spoke with at [facility] Agency: Kandee Keen  Prior Living Arrangements/Services Living arrangements for the past 2 months: Single Family Home Lives with:: Adult Children Patient language and need for interpreter reviewed:: Yes Do you feel safe going back to the place where you live?: Yes      Need for Family Participation in Patient Care: Yes (Comment) Care giver support system in place?: Yes (comment) Current home services: DME Criminal Activity/Legal Involvement Pertinent to Current Situation/Hospitalization: No - Comment as needed  Activities of Daily Living    ADL Screening (condition at time of admission) Independently performs ADLs?: Yes (appropriate for developmental age) Is the patient deaf or have difficulty hearing?: No Does the patient have difficulty seeing, even when wearing glasses/contacts?: No Does the patient have difficulty concentrating, remembering, or making decisions?: No  Permission Sought/Granted Permission sought to share information with : Facility Industrial/product designer granted to share information with : Yes, Verbal Permission Granted     Permission granted to share info w AGENCY: Home Health agencies        Emotional Assessment       Orientation: : Oriented to Self, Oriented to Place, Oriented to  Time, Oriented to Situation Alcohol / Substance Use: Not Applicable Psych Involvement: No (comment)  Admission diagnosis:  Lower urinary tract infectious disease [N39.0] UTI (urinary tract infection) [N39.0] Altered mental status, unspecified altered mental status type [R41.82] Sepsis due to gram-negative UTI (HCC) [A41.50, N39.0] Patient Active Problem List   Diagnosis Date Noted   Fall at home, initial encounter 05/01/2023   Diabetes mellitus, type 2 (HCC)    Sepsis due to gram-negative UTI (HCC) 04/30/2023   UTI (urinary tract infection) 04/30/2023   Vertigo 01/05/2023   GERD (gastroesophageal reflux disease) 07/14/2021   Peripheral vascular disease, unspecified (HCC) 2020   Narcotic dependence (HCC) 10/29/2016   CAD (coronary artery disease) 07/19/2016   Preventative health care 04/30/2014   Advanced directives,  counseling/discussion 04/30/2014   Atherosclerotic heart disease of native coronary artery with angina pectoris (HCC) 10/21/2012   KNEE PAIN, LEFT, CHRONIC 02/25/2010   Poorly controlled type 2 diabetes mellitus with circulatory disorder (HCC) 12/30/2009   Dyslipidemia 12/30/2009   Mood disorder (HCC) 12/30/2009   Hypertension 12/30/2009   Chronic back pain 12/30/2009   PCP:  Karie Schwalbe, MD Pharmacy:   [facility] [address] [place] - [address] [place] [address] Phone: [phone] Fax: [phone]  [id number] [place], [place] - [address] [facility] [address] [facility] [facility] [address] [place] [address] Phone: [phone] Fax: [phone]  [id number] [place],  - [address] [place] [address] Phone: [phone] Fax: [phone]  [facility] - [place], [place] - [address] [place] [address] Phone: [phone] Fax: [phone]     Social Drivers of Health (SDOH) Social History: SDOH Screenings   Food Insecurity: No Food Insecurity (05/01/2023)  Housing: Low Risk  (05/01/2023)  Transportation Needs: No Transportation Needs (05/01/2023)  Utilities: Not At Risk (05/01/2023)  Depression (PHQ2-9): Low Risk  (06/30/2022)  Financial Resource Strain: Medium Risk (06/24/2021)  Tobacco Use: Low Risk  (05/01/2023)   SDOH Interventions:     Readmission Risk Interventions    05/02/2023   11:52 AM  Readmission Risk Prevention Plan  Transportation Screening Complete  PCP or Specialist Appt within 5-7 Days Complete  Home Care Screening Complete  Medication Review (RN CM) Complete

## 2023-05-02 NOTE — Plan of Care (Signed)

## 2023-05-02 NOTE — Assessment & Plan Note (Signed)
Patient met sepsis criteria with fever, leukocytosis and tachycardia.  Severe sepsis with acute metabolic encephalopathy. Blood and urine cultures positive for E. coli-pending susceptibility.  -Continue with ceftriaxone -Follow-up final culture results

## 2023-05-03 DIAGNOSIS — A4150 Gram-negative sepsis, unspecified: Secondary | ICD-10-CM | POA: Diagnosis not present

## 2023-05-03 DIAGNOSIS — R4182 Altered mental status, unspecified: Secondary | ICD-10-CM | POA: Diagnosis not present

## 2023-05-03 DIAGNOSIS — I10 Essential (primary) hypertension: Secondary | ICD-10-CM | POA: Diagnosis not present

## 2023-05-03 DIAGNOSIS — E119 Type 2 diabetes mellitus without complications: Secondary | ICD-10-CM | POA: Diagnosis not present

## 2023-05-03 LAB — CULTURE, BLOOD (ROUTINE X 2): Special Requests: ADEQUATE

## 2023-05-03 LAB — URINE CULTURE: Culture: >=100000 — AB

## 2023-05-03 LAB — GLUCOSE, CAPILLARY
Glucose-Capillary: 115 mg/dL — ABNORMAL HIGH (ref 70–99)
Glucose-Capillary: 163 mg/dL — ABNORMAL HIGH (ref 70–99)

## 2023-05-03 MED ORDER — AMLODIPINE BESYLATE 10 MG PO TABS
10.0000 mg | ORAL_TABLET | Freq: Every day | ORAL | Status: DC
  Administered 2023-05-03: 10 mg via ORAL
  Filled 2023-05-03: qty 1

## 2023-05-03 MED ORDER — SULFAMETHOXAZOLE-TRIMETHOPRIM 800-160 MG PO TABS: 1.0000 | ORAL_TABLET | Freq: Two times a day (BID) | ORAL | 0 refills | Status: AC

## 2023-05-03 MED ORDER — AMLODIPINE BESYLATE 10 MG PO TABS: 10.0000 mg | ORAL_TABLET | Freq: Every day | ORAL | 1 refills | Status: DC

## 2023-05-03 NOTE — Discharge Summary (Signed)
Physician Discharge Summary   Patient: Cynthia Singh MRN: [id number] DOB: 07-17-1940  Admit date:     04/30/2023  Discharge date: 05/03/23  Discharge Physician: Arnetha Courser   PCP: Karie Schwalbe, MD   Recommendations at discharge:  Please obtain CBC and BMP on follow-up Follow-up with primary care provider within a week Please ensure completion of antibiotic for E. coli bacteremia.  Discharge Diagnoses: Principal Problem:   Sepsis due to gram-negative UTI [facility]) Active Problems:   Lower urinary tract infectious disease   Diabetes mellitus, type 2 (HCC)   Hypertension   CAD (coronary artery disease)   Dyslipidemia   Fall at home, initial encounter   Altered mental status   Hospital Course: Taken from H&P.   Cynthia Singh is a 82 y.o. female with medical history significant of CAD status post stenting, IDDM with insulin resistance, HTN, morbid obesity, PVD, presented with UTI-like symptoms and confusion.   On presentation febrile at 103, tachycardic 115, UA with concern of UTI and hematuria, leukocytosis at 18.5, lactic acid 2.1, bicarb 22.  Patient received IV bolus and started on ceftriaxone.  12/14: Afebrile this morning with elevated blood pressure at 170/79, blood cultures growing E. Coli.  Urine cultures ordered as add-on.  Improving leukocytosis at 14.3, lactic acidosis resolved.  Mild hypokalemia with potassium at 3.4, magnesium at 2, potassium is being replaced.  PT recommending home health.  12/15: Hemodynamically stable, Urine cultures also growing E. Coli-pending susceptibility. we will continue with IV antibiotics for another day   12/16: Remained hemodynamically stable.  Received susceptibility results and based on that she will be taking Bactrim for 4 more days. Ordered home health services as advised by our PT. Patient was also given a new prescription for amlodipine at 10 mg daily as blood pressure remained elevated, she has apparently stopped taking  her home amlodipine which was dosed as 2.5 mg daily.  Patient need to follow-up closely with primary care provider for further management of her hypertension.  Patient will continue with rest of her home medications as she was doing it before and follow-up with her doctor.  Assessment and Plan: * Sepsis due to gram-negative UTI [facility]) Patient met sepsis criteria with fever, leukocytosis and tachycardia.  Severe sepsis with acute metabolic encephalopathy. Blood and urine cultures positive for E. Coli-susceptibility came back and baster that she received 4 days of ceftriaxone and is being discharged for 4 more days of Bactrim.  Lower urinary tract infectious disease -Please see above  Diabetes mellitus, type 2 (HCC) Patient was on Jardiance and metformin along with Lantus at home which was continued. -SSI was also added  Hypertension Blood pressure mildly elevated. -Increase the dose of amlodipine to 10 mg daily -Continue to monitor  CAD (coronary artery disease) No acute concern. -Continue home aspirin, Plavix and statin  Dyslipidemia -Continue statin  Fall at home, initial encounter PT evaluation was obtained and they were recommending home health services   Pain control - [place] Controlled Substance Reporting System database was reviewed. and patient was instructed, not to drive, operate heavy machinery, perform activities at heights, swimming or participation in water activities or provide baby-sitting services while on Pain, Sleep and Anxiety Medications; until their outpatient Physician has advised to do so again. Also recommended to not to take more than prescribed Pain, Sleep and Anxiety Medications.  Consultants: None Procedures performed: None Disposition: Home health Diet recommendation:  Discharge Diet Orders (From admission, onward)     Start  Ordered   05/03/23 0000  Diet - low sodium heart healthy        05/03/23 1135           Cardiac and Carb  modified diet DISCHARGE MEDICATION: Allergies as of 05/03/2023   No Known Allergies      Medication List     TAKE these medications    amLODipine 10 MG tablet Commonly known as: NORVASC Take 1 tablet (10 mg total) by mouth daily. Start taking on: May 04, 2023 What changed:  medication strength See the new instructions.   aspirin EC 81 MG tablet Take 81 mg by mouth daily.   atorvastatin 40 MG tablet Commonly known as: LIPITOR TAKE 1 TABLET BY MOUTH EVERY DAY   clopidogrel 75 MG tablet Commonly known as: PLAVIX TAKE 1 TABLET BY MOUTH EVERY DAY   DULoxetine 60 MG capsule Commonly known as: CYMBALTA TAKE 1 CAPSULE BY MOUTH EVERY DAY   DULoxetine 30 MG capsule Commonly known as: CYMBALTA TAKE 1 CAPSULE BY MOUTH EVERY DAY   Jardiance 10 MG Tabs tablet Generic drug: empagliflozin Take 10 mg by mouth daily.   Lantus SoloStar 100 UNIT/ML Solostar Pen Generic drug: insulin glargine Inject 40 Units into the skin daily. TAKE 40 UNITS DAILY UNDER SKIN   meclizine 12.5 MG tablet Commonly known as: ANTIVERT Take 1-2 tablets (12.5-25 mg total) by mouth 3 (three) times daily as needed for dizziness.   metFORMIN 500 MG 24 hr tablet Commonly known as: GLUCOPHAGE-XR TAKE 2 TABLETS (1,000 MG TOTAL) BY MOUTH IN THE MORNING AND AT BEDTIME.   nitroGLYCERIN 0.4 MG SL tablet Commonly known as: Nitrostat Place 1 tablet (0.4 mg total) under the tongue every 5 (five) minutes as needed for chest pain.   nortriptyline 50 MG capsule Commonly known as: PAMELOR Take 3 capsules (150 mg total) by mouth at bedtime.   oxyCODONE-acetaminophen 10-325 MG tablet Commonly known as: PERCOCET Take 1-2 tablets by mouth every 6 (six) hours as needed for pain. M54.9   Pen Needles 32G X 5 MM Misc 1 Units by Does not apply route daily.   polyethylene glycol 17 g packet Commonly known as: MIRALAX / GLYCOLAX Take 17 g by mouth daily as needed.   sulfamethoxazole-trimethoprim 800-160 MG  tablet Commonly known as: BACTRIM DS Take 1 tablet by mouth 2 (two) times daily for 4 days.        Follow-up Information     Karie Schwalbe, MD. Schedule an appointment as soon as possible for a visit in 1 week(s).   Specialties: Internal Medicine, Pediatrics Contact information: [address] [place] [address] [phone]                Discharge Exam: Cynthia Singh Weights   05/01/23 0930  Weight: 85 kg   General.  Frail elderly lady, in no acute distress. Pulmonary.  Lungs clear bilaterally, normal respiratory effort. CV.  Regular rate and rhythm, no JVD, rub or murmur. Abdomen.  Soft, nontender, nondistended, BS positive. CNS.  Alert and oriented .  No focal neurologic deficit. Extremities.  No edema, no cyanosis, pulses intact and symmetrical. Psychiatry.  Judgment and insight appears normal.   Condition at discharge: stable  The results of significant diagnostics from this hospitalization (including imaging, microbiology, ancillary and laboratory) are listed below for reference.   Imaging Studies: CT Head Wo Contrast Result Date: 04/30/2023 CLINICAL DATA:  82 year old female with history of altered mental status. History of trauma from a fall. EXAM:  CT HEAD WITHOUT CONTRAST CT CERVICAL SPINE WITHOUT CONTRAST TECHNIQUE: Multidetector CT imaging of the head and cervical spine was performed following the standard protocol without intravenous contrast. Multiplanar CT image reconstructions of the cervical spine were also generated. RADIATION DOSE REDUCTION: This exam was performed according to the departmental dose-optimization program which includes automated exposure control, adjustment of the mA and/or kV according to patient size and/or use of iterative reconstruction technique. COMPARISON:  Head and cervical spine CT 01/28/2023. FINDINGS: CT HEAD FINDINGS Brain: No evidence of acute infarction, hemorrhage, hydrocephalus, extra-axial collection or mass  lesion/mass effect. Vascular: No hyperdense vessel or unexpected calcification. Skull: Normal. Negative for fracture or focal lesion. Sinuses/Orbits: No acute finding. Other: None. CT CERVICAL SPINE FINDINGS Alignment: Normal. Skull base and vertebrae: No acute fracture. No primary bone lesion or focal pathologic process. Soft tissues and spinal canal: No prevertebral fluid or swelling. No visible canal hematoma. Disc levels: Multilevel degenerative disc disease, most pronounced at C3-C4, C4-C5, C5-C6 and C6-C7. Moderate multilevel facet arthropathy bilaterally. Upper chest: Unremarkable. Other: There are no aggressive appearing lytic or blastic lesions noted in the visualized portions of the skeleton. IMPRESSION: 1. No evidence of significant acute traumatic injury to the skull, brain or cervical spine. 2. The appearance of the brain is normal for age. 3. Multilevel degenerative disc disease and cervical spondylosis, as above. Electronically Signed   By: Trudie Reed M.D.   On: 04/30/2023 06:23   CT Cervical Spine Wo Contrast Result Date: 04/30/2023 CLINICAL DATA:  82 year old female with history of altered mental status. History of trauma from a fall. EXAM: CT HEAD WITHOUT CONTRAST CT CERVICAL SPINE WITHOUT CONTRAST TECHNIQUE: Multidetector CT imaging of the head and cervical spine was performed following the standard protocol without intravenous contrast. Multiplanar CT image reconstructions of the cervical spine were also generated. RADIATION DOSE REDUCTION: This exam was performed according to the departmental dose-optimization program which includes automated exposure control, adjustment of the mA and/or kV according to patient size and/or use of iterative reconstruction technique. COMPARISON:  Head and cervical spine CT 01/28/2023. FINDINGS: CT HEAD FINDINGS Brain: No evidence of acute infarction, hemorrhage, hydrocephalus, extra-axial collection or mass lesion/mass effect. Vascular: No hyperdense  vessel or unexpected calcification. Skull: Normal. Negative for fracture or focal lesion. Sinuses/Orbits: No acute finding. Other: None. CT CERVICAL SPINE FINDINGS Alignment: Normal. Skull base and vertebrae: No acute fracture. No primary bone lesion or focal pathologic process. Soft tissues and spinal canal: No prevertebral fluid or swelling. No visible canal hematoma. Disc levels: Multilevel degenerative disc disease, most pronounced at C3-C4, C4-C5, C5-C6 and C6-C7. Moderate multilevel facet arthropathy bilaterally. Upper chest: Unremarkable. Other: There are no aggressive appearing lytic or blastic lesions noted in the visualized portions of the skeleton. IMPRESSION: 1. No evidence of significant acute traumatic injury to the skull, brain or cervical spine. 2. The appearance of the brain is normal for age. 3. Multilevel degenerative disc disease and cervical spondylosis, as above. Electronically Signed   By: Trudie Reed M.D.   On: 04/30/2023 06:23   DG Chest Port 1 View Result Date: 04/30/2023 CLINICAL DATA:  82 year old female with possible sepsis. EXAM: PORTABLE CHEST 1 VIEW COMPARISON:  Chest x-ray 09/04/2019. FINDINGS: Lung volumes are normal. No consolidative airspace disease. No pleural effusions. No pneumothorax. No evidence of pulmonary edema. Heart size is mildly enlarged. The patient is rotated to the right on today's exam, resulting in distortion of the mediastinal contours and reduced diagnostic sensitivity and specificity for mediastinal pathology. IMPRESSION: 1.  Low lung volumes without radiographic evidence of acute cardiopulmonary disease. 2. Cardiomegaly. Electronically Signed   By: Trudie Reed M.D.   On: 04/30/2023 05:43    Microbiology: Results for orders placed or performed during the hospital encounter of 04/30/23  Blood Culture (routine x 2)     Status: Abnormal (Preliminary result)   Collection Time: 04/30/23  4:49 AM   Specimen: BLOOD  Result Value Ref Range Status    Specimen Description   Final    BLOOD RIGHT FOREARM Performed at [facility], [address] [place], [place] [address]    Special Requests   Final    BOTTLES DRAWN AEROBIC AND ANAEROBIC Blood Culture adequate volume Performed at [facility], [address] [place], [place] [address]    Culture  Setup Time   Final    GRAM NEGATIVE RODS IN BOTH AEROBIC AND ANAEROBIC BOTTLES Organism ID to follow CRITICAL RESULT CALLED TO, READ BACK BY AND VERIFIED WITH: NATHAN BLUE AT 05/01/23 0048 BY AB Performed at [facility], [address] [place], [place] [address]    Culture ESCHERICHIA COLI (A)  Final   Report Status PENDING  Incomplete   Organism ID, Bacteria ESCHERICHIA COLI  Final   Organism ID, Bacteria ESCHERICHIA COLI  Final      Susceptibility   Escherichia coli - KIRBY BAUER*    CEFAZOLIN INTERMEDIATE Intermediate    Escherichia coli - MIC*    AMPICILLIN >=32 RESISTANT Resistant     CEFEPIME <=0.12 SENSITIVE Sensitive     CEFTAZIDIME <=1 SENSITIVE Sensitive     CEFTRIAXONE <=0.25 SENSITIVE Sensitive     CIPROFLOXACIN <=0.25 SENSITIVE Sensitive     GENTAMICIN <=1 SENSITIVE Sensitive     IMIPENEM <=0.25 SENSITIVE Sensitive     TRIMETH/SULFA <=20 SENSITIVE Sensitive     AMPICILLIN/SULBACTAM 16 INTERMEDIATE Intermediate     PIP/TAZO <=4 SENSITIVE Sensitive ug/mL    * ESCHERICHIA COLI    ESCHERICHIA COLI  Blood Culture (routine x 2)     Status: None (Preliminary result)   Collection Time: 04/30/23  4:49 AM   Specimen: BLOOD  Result Value Ref Range Status   Specimen Description BLOOD LEFT FOREARM  Final   Special Requests   Final    BOTTLES DRAWN AEROBIC AND ANAEROBIC Blood Culture adequate volume   Culture   Final    NO GROWTH 3 DAYS Performed at [facility], [address] [place], [place] [address]    Report Status PENDING  Incomplete  Blood Culture ID Panel (Reflexed)     Status: Abnormal   Collection Time: 04/30/23  4:49  AM  Result Value Ref Range Status   Enterococcus faecalis NOT DETECTED NOT DETECTED Final   Enterococcus Faecium NOT DETECTED NOT DETECTED Final   Listeria monocytogenes NOT DETECTED NOT DETECTED Final   Staphylococcus species NOT DETECTED NOT DETECTED Final   Staphylococcus aureus (BCID) NOT DETECTED NOT DETECTED Final   Staphylococcus epidermidis NOT DETECTED NOT DETECTED Final   Staphylococcus lugdunensis NOT DETECTED NOT DETECTED Final   Streptococcus species NOT DETECTED NOT DETECTED Final   Streptococcus agalactiae NOT DETECTED NOT DETECTED Final   Streptococcus pneumoniae NOT DETECTED NOT DETECTED Final   Streptococcus pyogenes NOT DETECTED NOT DETECTED Final   A.calcoaceticus-baumannii NOT DETECTED NOT DETECTED Final   Bacteroides fragilis NOT DETECTED NOT DETECTED Final   Enterobacterales DETECTED (A) NOT DETECTED Final    Comment: Enterobacterales represent a large order of gram negative bacteria, not a single organism.  CRITICAL RESULT CALLED TO, READ BACK BY AND VERIFIED WITH: NATHAN BLUE AT 05/01/23 0048 BY AB    Enterobacter cloacae complex NOT DETECTED NOT DETECTED Final   Escherichia coli DETECTED (A) NOT DETECTED Final    Comment: CRITICAL RESULT CALLED TO, READ BACK BY AND VERIFIED WITH: NATHAN BLUE AT 05/01/23 0048 BY AB    Klebsiella aerogenes NOT DETECTED NOT DETECTED Final   Klebsiella oxytoca NOT DETECTED NOT DETECTED Final   Klebsiella pneumoniae NOT DETECTED NOT DETECTED Final   Proteus species NOT DETECTED NOT DETECTED Final   Salmonella species NOT DETECTED NOT DETECTED Final   Serratia marcescens NOT DETECTED NOT DETECTED Final   Haemophilus influenzae NOT DETECTED NOT DETECTED Final   Neisseria meningitidis NOT DETECTED NOT DETECTED Final   Pseudomonas aeruginosa NOT DETECTED NOT DETECTED Final   Stenotrophomonas maltophilia NOT DETECTED NOT DETECTED Final   Candida albicans NOT DETECTED NOT DETECTED Final   Candida auris NOT DETECTED NOT DETECTED Final    Candida glabrata NOT DETECTED NOT DETECTED Final   Candida krusei NOT DETECTED NOT DETECTED Final   Candida parapsilosis NOT DETECTED NOT DETECTED Final   Candida tropicalis NOT DETECTED NOT DETECTED Final   Cryptococcus neoformans/gattii NOT DETECTED NOT DETECTED Final   CTX-M ESBL NOT DETECTED NOT DETECTED Final   Carbapenem resistance IMP NOT DETECTED NOT DETECTED Final   Carbapenem resistance KPC NOT DETECTED NOT DETECTED Final   Carbapenem resistance NDM NOT DETECTED NOT DETECTED Final   Carbapenem resist OXA 48 LIKE NOT DETECTED NOT DETECTED Final   Carbapenem resistance VIM NOT DETECTED NOT DETECTED Final    Comment: Performed at [facility], [address] [place], [place] [address]  Urine Culture (for pregnant, neutropenic or urologic patients or patients with an indwelling urinary catheter)     Status: Abnormal   Collection Time: 05/01/23  4:49 AM   Specimen: Urine, Clean Catch  Result Value Ref Range Status   Specimen Description   Final    URINE, CLEAN CATCH Performed at [facility], [address] [place], [place] [address]    Special Requests   Final    NONE Performed at [facility], [address] [place], [place] [address]    Culture >=100,000 COLONIES/mL ESCHERICHIA COLI (A)  Final   Report Status 05/03/2023 FINAL  Final   Organism ID, Bacteria ESCHERICHIA COLI (A)  Final      Susceptibility   Escherichia coli - MIC*    AMPICILLIN >=32 RESISTANT Resistant     CEFAZOLIN <=4 SENSITIVE Sensitive     CEFEPIME <=0.12 SENSITIVE Sensitive     CEFTRIAXONE <=0.25 SENSITIVE Sensitive     CIPROFLOXACIN <=0.25 SENSITIVE Sensitive     GENTAMICIN <=1 SENSITIVE Sensitive     IMIPENEM <=0.25 SENSITIVE Sensitive     NITROFURANTOIN <=16 SENSITIVE Sensitive     TRIMETH/SULFA <=20 SENSITIVE Sensitive     AMPICILLIN/SULBACTAM 16 INTERMEDIATE Intermediate     PIP/TAZO <=4 SENSITIVE Sensitive ug/mL    * >=100,000 COLONIES/mL ESCHERICHIA COLI     Labs: CBC: Recent Labs  Lab 04/30/23 0449 05/01/23 0438 05/02/23 0457  WBC 18.9* 14.3* 12.2*  NEUTROABS 15.9*  --   --   HGB 13.1 12.4 12.2  HCT 39.8 38.5 37.3  MCV 86.0 87.3 85.7  PLT 283 272 276   Basic Metabolic Panel: Recent Labs  Lab 04/30/23 0449 05/01/23 0438 05/02/23 0457  NA 132* 137 138  K 3.5 3.4* 4.1  CL 99 105 104  CO2  22 22 24   GLUCOSE 236* 124* 91  BUN 18 19 17   CREATININE 0.88 0.90 0.89  CALCIUM 8.8* 9.0 8.4*  MG  --  2.0  --    Liver Function Tests: Recent Labs  Lab 04/30/23 0449  AST 16  ALT 12  ALKPHOS 93  BILITOT 0.8  PROT 7.6  ALBUMIN 3.5   CBG: Recent Labs  Lab 05/02/23 0826 05/02/23 1115 05/02/23 1644 05/02/23 2055 05/03/23 0743  GLUCAP 87 145* 99 100* 115*    Discharge time spent: greater than 30 minutes.  This record has been created using Conservation officer, historic buildings. Errors have been sought and corrected,but may not always be located. Such creation errors do not reflect on the standard of care.   Signed: Arnetha Courser, MD [facility] 05/03/2023

## 2023-05-03 NOTE — Plan of Care (Signed)
  Problem: Education: Goal: Ability to describe self-care measures that may prevent or decrease complications (Diabetes Survival Skills Education) will improve Outcome: Progressing Goal: Individualized Educational Video(s) Outcome: Progressing   Problem: Coping: Goal: Ability to adjust to condition or change in health will improve Outcome: Progressing   Problem: Fluid Volume: Goal: Ability to maintain a balanced intake and output will improve Outcome: Progressing   Problem: Health Behavior/Discharge Planning: Goal: Ability to identify and utilize available resources and services will improve Outcome: Progressing Goal: Ability to manage health-related needs will improve Outcome: Progressing   Problem: Metabolic: Goal: Ability to maintain appropriate glucose levels will improve Outcome: Progressing   Problem: Nutritional: Goal: Maintenance of adequate nutrition will improve Outcome: Progressing Goal: Progress toward achieving an optimal weight will improve Outcome: Progressing   Problem: Skin Integrity: Goal: Risk for impaired skin integrity will decrease Outcome: Progressing   Problem: Tissue Perfusion: Goal: Adequacy of tissue perfusion will improve Outcome: Progressing   Problem: Education: Goal: Knowledge of General Education information will improve Description: Including pain rating scale, medication(s)/side effects and non-pharmacologic comfort measures Outcome: Progressing   Problem: Health Behavior/Discharge Planning: Goal: Ability to manage health-related needs will improve Outcome: Progressing   Problem: Clinical Measurements: Goal: Ability to maintain clinical measurements within normal limits will improve Outcome: Progressing Goal: Will remain free from infection Outcome: Progressing Goal: Diagnostic test results will improve Outcome: Progressing Goal: Respiratory complications will improve Outcome: Progressing Goal: Cardiovascular complication will  be avoided Outcome: Progressing   Problem: Activity: Goal: Risk for activity intolerance will decrease Outcome: Progressing   Problem: Nutrition: Goal: Adequate nutrition will be maintained Outcome: Progressing   Problem: Coping: Goal: Level of anxiety will decrease Outcome: Progressing   Problem: Elimination: Goal: Will not experience complications related to bowel motility Outcome: Progressing Goal: Will not experience complications related to urinary retention Outcome: Progressing   Problem: Pain Management: Goal: General experience of comfort will improve Outcome: Progressing   Problem: Safety: Goal: Ability to remain free from injury will improve Outcome: Progressing   Problem: Skin Integrity: Goal: Risk for impaired skin integrity will decrease Outcome: Progressing   Problem: Education: Goal: Ability to describe self-care measures that may prevent or decrease complications (Diabetes Survival Skills Education) will improve Outcome: Progressing Goal: Individualized Educational Video(s) Outcome: Progressing   Problem: Coping: Goal: Ability to adjust to condition or change in health will improve Outcome: Progressing   Problem: Fluid Volume: Goal: Ability to maintain a balanced intake and output will improve Outcome: Progressing   Problem: Health Behavior/Discharge Planning: Goal: Ability to identify and utilize available resources and services will improve Outcome: Progressing Goal: Ability to manage health-related needs will improve Outcome: Progressing   Problem: Metabolic: Goal: Ability to maintain appropriate glucose levels will improve Outcome: Progressing   Problem: Nutritional: Goal: Maintenance of adequate nutrition will improve Outcome: Progressing Goal: Progress toward achieving an optimal weight will improve Outcome: Progressing   Problem: Skin Integrity: Goal: Risk for impaired skin integrity will decrease Outcome: Progressing    Problem: Tissue Perfusion: Goal: Adequacy of tissue perfusion will improve Outcome: Progressing

## 2023-05-03 NOTE — Plan of Care (Signed)
Problem: Education: Goal: Ability to describe self-care measures that may prevent or decrease complications (Diabetes Survival Skills Education) will improve Outcome: Adequate for Discharge Goal: Individualized Educational Video(s) Outcome: Adequate for Discharge   Problem: Coping: Goal: Ability to adjust to condition or change in health will improve Outcome: Adequate for Discharge   Problem: Fluid Volume: Goal: Ability to maintain a balanced intake and output will improve Outcome: Adequate for Discharge   Problem: Health Behavior/Discharge Planning: Goal: Ability to identify and utilize available resources and services will improve Outcome: Adequate for Discharge Goal: Ability to manage health-related needs will improve Outcome: Adequate for Discharge   Problem: Metabolic: Goal: Ability to maintain appropriate glucose levels will improve Outcome: Adequate for Discharge   Problem: Nutritional: Goal: Maintenance of adequate nutrition will improve Outcome: Adequate for Discharge Goal: Progress toward achieving an optimal weight will improve Outcome: Adequate for Discharge   Problem: Skin Integrity: Goal: Risk for impaired skin integrity will decrease Outcome: Adequate for Discharge   Problem: Tissue Perfusion: Goal: Adequacy of tissue perfusion will improve Outcome: Adequate for Discharge   Problem: Education: Goal: Knowledge of General Education information will improve Description: Including pain rating scale, medication(s)/side effects and non-pharmacologic comfort measures Outcome: Adequate for Discharge   Problem: Health Behavior/Discharge Planning: Goal: Ability to manage health-related needs will improve Outcome: Adequate for Discharge   Problem: Clinical Measurements: Goal: Ability to maintain clinical measurements within normal limits will improve Outcome: Adequate for Discharge Goal: Will remain free from infection Outcome: Adequate for Discharge Goal:  Diagnostic test results will improve Outcome: Adequate for Discharge Goal: Respiratory complications will improve Outcome: Adequate for Discharge Goal: Cardiovascular complication will be avoided Outcome: Adequate for Discharge   Problem: Activity: Goal: Risk for activity intolerance will decrease Outcome: Adequate for Discharge   Problem: Nutrition: Goal: Adequate nutrition will be maintained Outcome: Adequate for Discharge   Problem: Coping: Goal: Level of anxiety will decrease Outcome: Adequate for Discharge   Problem: Elimination: Goal: Will not experience complications related to bowel motility Outcome: Adequate for Discharge Goal: Will not experience complications related to urinary retention Outcome: Adequate for Discharge   Problem: Pain Management: Goal: General experience of comfort will improve Outcome: Adequate for Discharge   Problem: Safety: Goal: Ability to remain free from injury will improve Outcome: Adequate for Discharge   Problem: Skin Integrity: Goal: Risk for impaired skin integrity will decrease Outcome: Adequate for Discharge   Problem: Education: Goal: Ability to describe self-care measures that may prevent or decrease complications (Diabetes Survival Skills Education) will improve Outcome: Adequate for Discharge Goal: Individualized Educational Video(s) Outcome: Adequate for Discharge   Problem: Coping: Goal: Ability to adjust to condition or change in health will improve Outcome: Adequate for Discharge   Problem: Fluid Volume: Goal: Ability to maintain a balanced intake and output will improve Outcome: Adequate for Discharge   Problem: Health Behavior/Discharge Planning: Goal: Ability to identify and utilize available resources and services will improve Outcome: Adequate for Discharge Goal: Ability to manage health-related needs will improve Outcome: Adequate for Discharge   Problem: Metabolic: Goal: Ability to maintain appropriate  glucose levels will improve Outcome: Adequate for Discharge   Problem: Nutritional: Goal: Maintenance of adequate nutrition will improve Outcome: Adequate for Discharge Goal: Progress toward achieving an optimal weight will improve Outcome: Adequate for Discharge   Problem: Skin Integrity: Goal: Risk for impaired skin integrity will decrease Outcome: Adequate for Discharge   Problem: Tissue Perfusion: Goal: Adequacy of tissue perfusion will improve Outcome: Adequate for Discharge

## 2023-05-03 NOTE — TOC Transition Note (Signed)
Transition of Care [facility]) - Discharge Note   Patient Details  Name: Cynthia Singh MRN: [id number] Date of Birth: [birth year]  Transition of Care [facility]) CM/SW Contact:  Garret Reddish, RN Phone Number: 05/03/2023, 12:57 PM   Clinical Narrative:    Chart reviewed.  Noted that patient has orders for discharge today.    I have made [facility] aware with Frances Furbish that patient will be a discharge for today. Frances Furbish will provide PT/OT services on discharge.    I have informed staff nurse of the above information.     Final next level of care: Home w Home Health Services Barriers to Discharge: No Barriers Identified   Patient Goals and CMS Choice Patient states their goals for this hospitalization and ongoing recovery are:: home with home health CMS Medicare.gov Compare Post Acute Care list provided to:: Patient Choice offered to / list presented to : Patient      Discharge Placement                    Patient and family notified of of transfer: 05/03/23  Discharge Plan and Services Additional resources added to the After Visit Summary for                            [facility] Arranged: PT, OT [facility] Agency: [facility] Care Date [facility] Agency Contacted: 05/02/23   Representative spoke with at [facility] Agency: Kandee Keen  Social Drivers of Health (SDOH) Interventions SDOH Screenings   Food Insecurity: No Food Insecurity (05/01/2023)  Housing: Low Risk  (05/01/2023)  Transportation Needs: No Transportation Needs (05/01/2023)  Utilities: Not At Risk (05/01/2023)  Depression (PHQ2-9): Low Risk  (06/30/2022)  Financial Resource Strain: Medium Risk (06/24/2021)  Tobacco Use: Low Risk  (05/01/2023)     Readmission Risk Interventions    05/02/2023   11:52 AM  Readmission Risk Prevention Plan  Transportation Screening Complete  PCP or Specialist Appt within 5-7 Days Complete  Home Care Screening Complete  Medication Review (RN CM) Complete

## 2023-05-03 NOTE — Consult Note (Incomplete)
[facility] Liaison Note  05/03/2023  Cynthia Singh Sep 13, 1940 [id number]  Location: RN Hospital Liaison met patient at bedside at [facility].  Insurance: [employer] HMO   Cynthia Singh is a 82 y.o. female who is a Primary Care Patient of Tillman Abide I, MD The patient was screened for readmission hospitalization with noted medium risk score for unplanned readmission risk with 1 IP/1 ED in 6 months.  The patient was assessed for potential Care Management service needs for post hospital transition for care coordination. Review of patient's electronic medical record reveals patient was admitted for Sepsis due to gram negative. Liaison ......  Plan: [facility] Liaison will continue to follow progress and disposition to asess for post hospital community care coordination/management needs.  Referral request for community care coordination: anticipate Transitions of Care Team follow up.   [facility] Care Management/Population Health does not replace or interfere with any arrangements made by the Inpatient Transition of Care team.   For questions contact:   Elliot Cousin, RN, [facility] Liaison [place]   [facility], Population Health Office Hours MTWF  8:00 am-6:00 pm Direct Dial: [phone] mobile [phone] [Office toll free line] Office Hours are M-F 8:30 - 5 pm [email or web address]

## 2023-05-04 ENCOUNTER — Telehealth: Payer: Self-pay | Admitting: *Deleted

## 2023-05-04 NOTE — Transitions of Care (Post Inpatient/ED Visit) (Signed)
   05/04/2023  Name: Cynthia Singh MRN: [id number] DOB: 25-Oct-1940  Today's TOC FU Call Status: Today's TOC FU Call Status:: Unsuccessful Call (1st Attempt)  Attempted to reach the patient regarding the most recent Inpatient/ED visit.  Follow Up Plan: Additional outreach attempts will be made to reach the patient to complete the Transitions of Care (Post Inpatient/ED visit) call.   Gean Maidens BSN RN Population Health- Transition of Care Team.  [facility] [phone]

## 2023-05-05 ENCOUNTER — Telehealth: Payer: Self-pay

## 2023-05-05 LAB — CULTURE, BLOOD (ROUTINE X 2): Special Requests: ADEQUATE

## 2023-05-05 NOTE — Transitions of Care (Post Inpatient/ED Visit) (Signed)
   05/05/2023  Name: Cynthia Singh MRN: [id number] DOB: 02/15/1941  Today's TOC FU Call Status: Today's TOC FU Call Status:: Unsuccessful Call (2nd Attempt) Unsuccessful Call (2nd Attempt) Date: 05/05/23  Attempted to reach the patient regarding the most recent Inpatient/ED visit.  Follow Up Plan: Additional outreach attempts will be made to reach the patient to complete the Transitions of Care (Post Inpatient/ED visit) call.   Deidre Ala, RN Medical illustrator [facility]-Population Health [phone]

## 2023-05-06 ENCOUNTER — Telehealth: Payer: Self-pay

## 2023-05-06 NOTE — Transitions of Care (Post Inpatient/ED Visit) (Signed)
   05/06/2023  Name: BELLISSA TSCHIDA MRN: [id number] DOB: 1940-08-07  Today's TOC FU Call Status: Today's TOC FU Call Status:: Unsuccessful Call (3rd Attempt) Unsuccessful Call (2nd Attempt) Date: 05/05/23 Unsuccessful Call (3rd Attempt) Date: 05/06/23  Attempted to reach the patient regarding the most recent Inpatient/ED visit.  Follow Up Plan: No further outreach attempts will be made at this time. We have been unable to contact the patient.  Deidre Ala, RN Medical illustrator [facility]Population Health [phone]

## 2023-05-06 NOTE — Telephone Encounter (Signed)
Copied from CRM [id number]. Topic: General - Other >> May 06, 2023  2:33 PM Donita Brooks wrote: Reason for CRM: Eileen Stanford from [facility] wanted to let the provider know that, Delay on client request. Pt started care is Dec 27th - call back number for any question [phone]

## 2023-05-06 NOTE — Telephone Encounter (Signed)
Okay noted

## 2023-05-10 ENCOUNTER — Ambulatory Visit: Payer: Medicare HMO | Admitting: Internal Medicine

## 2023-05-10 ENCOUNTER — Encounter: Payer: Self-pay | Admitting: Internal Medicine

## 2023-05-10 ENCOUNTER — Encounter: Payer: Self-pay | Admitting: Pharmacist

## 2023-05-10 VITALS — BP 138/84 | HR 90 | Temp 98.3°F | Ht 67.0 in | Wt 177.0 lb

## 2023-05-10 DIAGNOSIS — G8929 Other chronic pain: Secondary | ICD-10-CM

## 2023-05-10 DIAGNOSIS — A4151 Sepsis due to Escherichia coli [E. coli]: Secondary | ICD-10-CM | POA: Insufficient documentation

## 2023-05-10 DIAGNOSIS — Z794 Long term (current) use of insulin: Secondary | ICD-10-CM

## 2023-05-10 DIAGNOSIS — E119 Type 2 diabetes mellitus without complications: Secondary | ICD-10-CM

## 2023-05-10 DIAGNOSIS — F1120 Opioid dependence, uncomplicated: Secondary | ICD-10-CM

## 2023-05-10 DIAGNOSIS — Z7984 Long term (current) use of oral hypoglycemic drugs: Secondary | ICD-10-CM

## 2023-05-10 DIAGNOSIS — M545 Low back pain, unspecified: Secondary | ICD-10-CM | POA: Diagnosis not present

## 2023-05-10 DIAGNOSIS — E1159 Type 2 diabetes mellitus with other circulatory complications: Secondary | ICD-10-CM | POA: Diagnosis not present

## 2023-05-10 DIAGNOSIS — E1165 Type 2 diabetes mellitus with hyperglycemia: Secondary | ICD-10-CM

## 2023-05-10 MED ORDER — LANTUS SOLOSTAR 100 UNIT/ML ~~LOC~~ SOPN: 40.0000 [IU] | PEN_INJECTOR | Freq: Every day | SUBCUTANEOUS | 11 refills | Status: AC

## 2023-05-10 MED ORDER — LANTUS SOLOSTAR 100 UNIT/ML ~~LOC~~ SOPN: 40.0000 [IU] | PEN_INJECTOR | Freq: Every day | SUBCUTANEOUS | 11 refills | Status: DC

## 2023-05-10 NOTE — Assessment & Plan Note (Signed)
PDMP reviewed No concerns 

## 2023-05-10 NOTE — Progress Notes (Signed)
Called patient to discuss medication access. Reported at PCP visit that she has run out of Lantus.   Unclear at this time how she has been receiving this medication. Chart notes state that she was enrolled in [employer] PAP in 2023. No notes suggesting re-enrollment for 2024. Last clinic note regarding medication delivery for clinic was 04/2022.   Called patient to discuss (re?)enrollment in PAP for 2025. No answer, voicemail is full at this time.  In the meantime, with the upcoming Holiday/office closures, consider sending short-term refill to patient's local pharmacy.   Msg sent to PCP clinical pool for Lantus refill.   PharmD will attempt to reach patient again 12/24 AM.  Loree Fee, PharmD Clinical Pharmacist [facility] [phone]

## 2023-05-10 NOTE — Addendum Note (Signed)
Addended by: Eual Fines on: 05/10/2023 04:18 PM   Modules accepted: Orders

## 2023-05-10 NOTE — Assessment & Plan Note (Signed)
Continues on the duloetine 90mg  daiy and oxycodone 10/325 four tabs daily

## 2023-05-10 NOTE — Progress Notes (Signed)
Subjective:    Patient ID: Cynthia Singh, female    DOB: December 10, 1940, 82 y.o.   MRN: [id number]  HPI Here with daughter for hospital follow up  Sons noticed she was "out of it" Apparent delirium No urinary symptoms or fever Had fallen  Admitted 12/13--diagnosed with sepsis based on fever, tachycardia, leukocytosis Presumed from urine Then blood cultures positive for E coli Started on cetriaxone then changed to bactrim for oral Rx Confusion did clear Home 12/16  Now finished with antibiotic Feels better but still not great  Sugars were high but are trending down  Same pain and narcotic use  Awaiting home health Will come for PT/OT after Christmas  Current Outpatient Medications on File Prior to Visit  Medication Sig Dispense Refill   amLODipine (NORVASC) 10 MG tablet Take 1 tablet (10 mg total) by mouth daily. 30 tablet 1   aspirin EC 81 MG tablet Take 81 mg by mouth daily.     atorvastatin (LIPITOR) 40 MG tablet TAKE 1 TABLET BY MOUTH EVERY DAY 90 tablet 3   clopidogrel (PLAVIX) 75 MG tablet TAKE 1 TABLET BY MOUTH EVERY DAY 90 tablet 3   DULoxetine (CYMBALTA) 30 MG capsule TAKE 1 CAPSULE BY MOUTH EVERY DAY 90 capsule 3   DULoxetine (CYMBALTA) 60 MG capsule TAKE 1 CAPSULE BY MOUTH EVERY DAY 90 capsule 3   insulin glargine (LANTUS SOLOSTAR) 100 UNIT/ML Solostar Pen Inject 40 Units into the skin daily. TAKE 40 UNITS DAILY UNDER SKIN 1 mL 0   Insulin Pen Needle (PEN NEEDLES) 32G X 5 MM MISC 1 Units by Does not apply route daily. 100 each 3   JARDIANCE 10 MG TABS tablet Take 10 mg by mouth daily.     meclizine (ANTIVERT) 12.5 MG tablet Take 1-2 tablets (12.5-25 mg total) by mouth 3 (three) times daily as needed for dizziness. 90 tablet 3   metFORMIN (GLUCOPHAGE-XR) 500 MG 24 hr tablet TAKE 2 TABLETS (1,000 MG TOTAL) BY MOUTH IN THE MORNING AND AT BEDTIME. 360 tablet 3   nitroGLYCERIN (NITROSTAT) 0.4 MG SL tablet Place 1 tablet (0.4 mg total) under the tongue every 5 (five)  minutes as needed for chest pain. 30 tablet 0   nortriptyline (PAMELOR) 50 MG capsule Take 3 capsules (150 mg total) by mouth at bedtime. 270 capsule 3   oxyCODONE-acetaminophen (PERCOCET) 10-325 MG tablet Take 1-2 tablets by mouth every 6 (six) hours as needed for pain. M54.9 150 tablet 0   polyethylene glycol (MIRALAX / GLYCOLAX) packet Take 17 g by mouth daily as needed.      No current facility-administered medications on file prior to visit.    No Known Allergies  Past Medical History:  Diagnosis Date   CAD (coronary artery disease)    a. Remote nonobstructive disease but in 10/2012 Cath/PCI: s/p DES to RCA. b. cath 02/08/15 DES to prox LAD and balloon angioplasty of ost D1, EF 55-65%; c. 08/2019 PCI/DES prox/distal RCA (overlapping prev RCA stent). Prev placed RCA/LAD stents patent.   Concussion    SAH AFTER FALL 3/18   Depression    Diabetes mellitus, type 2 (HCC)    Dyslipidemia    Episodic mood disorder (HCC)    Hypertension    Myocardial infarction (HCC)    2014   Obesity    Osteoarthritis of spine    knees also   Peripheral vascular disease, unspecified (HCC) 2020   severe disease on home screening by insurance    Past Surgical History:  Procedure Laterality Date   ABDOMINAL HYSTERECTOMY     APPENDECTOMY  1959   CARDIAC CATHETERIZATION N/A 02/08/2015   Procedure: Left Heart Cath and Coronary Angiography;  Surgeon: Tonny Bollman, MD;  Location: [facility] INVASIVE CV LAB;  Service: Cardiovascular;  Laterality: N/A;   CARDIAC CATHETERIZATION N/A 02/08/2015   Procedure: Coronary Stent Intervention;  Surgeon: Tonny Bollman, MD;  Location: [facility] INVASIVE CV LAB;  Service: Cardiovascular;  Laterality: N/A;   CARDIAC CATHETERIZATION N/A 02/08/2015   Procedure: Intravascular Pressure Wire/FFR Study;  Surgeon: Tonny Bollman, MD;  Location: [facility] INVASIVE CV LAB;  Service: Cardiovascular;  Laterality: N/A;   CATARACT EXTRACTION W/PHACO Left 11/10/2016   Procedure: CATARACT EXTRACTION PHACO  AND INTRAOCULAR LENS PLACEMENT (IOC) suture placed in left eye at end of procedure;  Surgeon: Galen Manila, MD;  Location: [facility] ORS;  Service: Ophthalmology;  Laterality: Left;  [place]  3.20 AP% 27.7 CDE 55.63 Fluid pack lot # [id number]   CHOLECYSTECTOMY     COMBINED HYSTERECTOMY ABDOMINAL W/ A&P REPAIR / OOPHORECTOMY  1968   CORONARY ANGIOPLASTY     STENTS X 5   CORONARY STENT INTERVENTION N/A 09/19/2019   Procedure: CORONARY STENT INTERVENTION;  Surgeon: Yvonne Kendall, MD;  Location: [facility] INVASIVE CV LAB;  Service: Cardiovascular;  Laterality: N/A;   CORONARY STENT PLACEMENT  2014   DILATION AND CURETTAGE OF UTERUS     GALLBLADDER SURGERY  1968   LEFT HEART CATH AND CORONARY ANGIOGRAPHY Left 09/19/2019   Procedure: LEFT HEART CATH AND CORONARY ANGIOGRAPHY;  Surgeon: Yvonne Kendall, MD;  Location: [facility] INVASIVE CV LAB;  Service: Cardiovascular;  Laterality: Left;   LEFT HEART CATHETERIZATION WITH CORONARY ANGIOGRAM N/A 10/20/2012   Procedure: LEFT HEART CATHETERIZATION WITH CORONARY ANGIOGRAM;  Surgeon: Tonny Bollman, MD;  Location: [facility] CATH LAB;  Service: Cardiovascular;  Laterality: N/A;   multiple D&C     TONSILLECTOMY  age 32    Family History  Adopted: Yes  Problem Relation Age of Onset   Cancer Brother        Colon   Cancer Son    Coronary artery disease Neg Hx    Heart attack Neg Hx     Social History   Socioeconomic History   Marital status: Widowed    Spouse name: Not on file   Number of children: 3   Years of education: 64   Highest education level: Not on file  Occupational History   Occupation: CASHIER  Tobacco Use   Smoking status: Never    Passive exposure: Past   Smokeless tobacco: Never  Vaping Use   Vaping status: Never Used  Substance and Sexual Activity   Alcohol use: No   Drug use: No   Sexual activity: Never    Birth control/protection: Post-menopausal  Other Topics Concern   Not on file  Social History Narrative   Widowed '13. 2 sons- '63,  '66; one daughter-'69;    11 grandchildren--has raised 9 of her grandchildren 4 great grandchildren.   Lives with 2 granddaughters and adoptive son      Has living will   Probably would have granddaughter Cristie Hem   Would accept resuscitation but no prolonged ventilation   Not sure about tube feeds   Social Drivers of Health   Financial Resource Strain: Medium Risk (06/24/2021)   Overall Financial Resource Strain (CARDIA)    Difficulty of Paying Living Expenses: Somewhat hard  Food Insecurity: No Food Insecurity (05/01/2023)   Hunger Vital Sign    Worried About Running Out  of Food in the Last Year: Never true    Ran Out of Food in the Last Year: Never true  Transportation Needs: No Transportation Needs (05/01/2023)   PRAPARE - Administrator, Civil Service (Medical): No    Lack of Transportation (Non-Medical): No  Physical Activity: Not on file  Stress: Not on file  Social Connections: Not on file  Intimate Partner Violence: Not At Risk (05/01/2023)   Humiliation, Afraid, Rape, and Kick questionnaire    Fear of Current or Ex-Partner: No    Emotionally Abused: No    Physically Abused: No    Sexually Abused: No   Review of Systems Eating---but not a lot Has lost 10#     Objective:   Physical Exam Constitutional:      Appearance: Normal appearance.  Cardiovascular:     Rate and Rhythm: Normal rate and regular rhythm.     Heart sounds: No murmur heard.    No gallop.  Pulmonary:     Effort: Pulmonary effort is normal.     Breath sounds: Normal breath sounds. No wheezing or rales.  Abdominal:     Palpations: Abdomen is soft.     Tenderness: There is no abdominal tenderness.  Musculoskeletal:     Cervical back: Neck supple.  Lymphadenopathy:     Cervical: No cervical adenopathy.  Neurological:     Mental Status: She is alert.  Psychiatric:        Mood and Affect: Mood normal.        Behavior: Behavior normal.            Assessment & Plan:

## 2023-05-10 NOTE — Assessment & Plan Note (Signed)
From urine Ceftriaxone and then bactrim Symptoms better Had delirium which has also resolved

## 2023-05-10 NOTE — Assessment & Plan Note (Signed)
Sugar was high with the sepsis---but last A1c was better Continue the lantus 40 units (but ran out), jardiance, metformin 100 bid

## 2023-05-13 ENCOUNTER — Other Ambulatory Visit: Payer: Self-pay | Admitting: Internal Medicine

## 2023-05-13 ENCOUNTER — Other Ambulatory Visit: Payer: Self-pay | Admitting: Family Medicine

## 2023-05-13 DIAGNOSIS — E785 Hyperlipidemia, unspecified: Secondary | ICD-10-CM

## 2023-05-25 ENCOUNTER — Telehealth: Payer: Self-pay

## 2023-05-25 MED ORDER — OXYCODONE-ACETAMINOPHEN 10-325 MG PO TABS: 1.0000 | ORAL_TABLET | Freq: Four times a day (QID) | ORAL | 0 refills | Status: DC | PRN

## 2023-05-25 NOTE — Addendum Note (Signed)
 Addended by: Tillman Abide I on: 05/25/2023 01:16 PM   Modules accepted: Orders

## 2023-05-25 NOTE — Telephone Encounter (Signed)
 Rx sent

## 2023-05-25 NOTE — Telephone Encounter (Signed)
 Copied from CRM [id number]. Topic: Clinical - Medication Refill >> May 25, 2023  9:57 AM Cynthia Singh wrote: Most Recent Primary Care Visit:  Provider: JIMMY ADE I  Department: [facility]  Visit Type: HOSPITAL FU  Date: 05/10/2023  Medication: oxyCODONE -acetaminophen  (PERCOCET) 10-325 MG tablet  Has the patient contacted their pharmacy? No, she has to call in for a refill for this medication.  (Agent: If no, request that the patient contact the pharmacy for the refill. If patient does not wish to contact the pharmacy document the reason why and proceed with request.) (Agent: If yes, when and what did the pharmacy advise?)  Is this the correct pharmacy for this prescription? Yes If no, delete pharmacy and type the correct one.  This is the patient's preferred pharmacy:   [facility] [address] [place] - [address] [place] [address] Phone: [phone] Fax: [phone]      Has the prescription been filled recently? No  Is the patient out of the medication? Yes  Has the patient been seen for an appointment in the last year OR does the patient have an upcoming appointment? Yes  Can we respond through MyChart? No  Agent: Please be advised that Rx refills may take up to 3 business days. We ask that you follow-up with your pharmacy.

## 2023-05-25 NOTE — Telephone Encounter (Signed)
 Refill request for oxycodone-acetaminophen 10-325 mg tabs  LOV - 05/10/23 Next OV - 08/10/23 Last refill - 04/22/23 #150/0

## 2023-06-22 ENCOUNTER — Other Ambulatory Visit: Payer: Self-pay | Admitting: Internal Medicine

## 2023-06-22 MED ORDER — OXYCODONE-ACETAMINOPHEN 10-325 MG PO TABS: 1.0000 | ORAL_TABLET | Freq: Four times a day (QID) | ORAL | 0 refills | Status: DC | PRN

## 2023-06-22 NOTE — Telephone Encounter (Signed)
 Copied from CRM [id number]. Topic: Clinical - Medication Refill >> Jun 22, 2023  9:09 AM Viola FALCON wrote: Most Recent Primary Care Visit:  Provider: JIMMY ADE I  Department: [facility]  Visit Type: HOSPITAL FU  Date: 05/10/2023  Medication: oxyCODONE -acetaminophen  (PERCOCET) 10-325 MG tablet [[id number]]  Has the patient contacted their pharmacy? No (Agent: If no, request that the patient contact the pharmacy for the refill. If patient does not wish to contact the pharmacy document the reason why and proceed with request.) (Agent: If yes, when and what did the pharmacy advise?)  Is this the correct pharmacy for this prescription? Yes If no, delete pharmacy and type the correct one.  This is the patient's preferred pharmacy:   [facility] [address] [place] - [address] [place] [address] Phone: [phone] Fax: [phone]   Has the prescription been filled recently? Yes  Is the patient out of the medication? Yes  Has the patient been seen for an appointment in the last year OR does the patient have an upcoming appointment? Yes  Can we respond through MyChart? No  Agent: Please be advised that Rx refills may take up to 3 business days. We ask that you follow-up with your pharmacy.

## 2023-06-24 ENCOUNTER — Ambulatory Visit: Payer: Self-pay | Admitting: Internal Medicine

## 2023-06-24 MED ORDER — OXYCODONE-ACETAMINOPHEN 10-325 MG PO TABS: 1.0000 | ORAL_TABLET | Freq: Four times a day (QID) | ORAL | 0 refills | Status: DC | PRN

## 2023-06-24 NOTE — Telephone Encounter (Signed)
 Copied from CRM [id number]. Topic: Clinical - Red Word Triage >> Jun 24, 2023 12:54 PM Drema MATSU wrote: Red Word that prompted transfer to Nurse Triage: Patient stated that she is in a lot of pain in her legs and back.    Chief Complaint: Prescription issue  Additional Notes: Patient states her pharmacy is running short on her oxycodone /acet 10-325mg , patient asking if her prescription can be sent to a different pharmacy. RN updated pharmacy and route to clinic for follow-up   Reason for Disposition  Caller requesting a CONTROLLED substance prescription refill (e.g., narcotics, ADHD medicines)  Answer Assessment - Initial Assessment Questions 1. REASON FOR CALL or QUESTION: What is your reason for calling today? or How can I best help you? or What question do you have that I can help answer?     Patient asking is prescription oxycodone  be sent to a different pharmacy  2. CALLER: Document the source of call. (e.g., laboratory, patient).     Patient  Protocols used: Medication Refill and Renewal Call-A-AH, PCP Call - No Triage-A-AH

## 2023-06-24 NOTE — Addendum Note (Signed)
 Addended by: Curt Dover I on: 06/24/2023 02:20 PM   Modules accepted: Orders

## 2023-06-24 NOTE — Telephone Encounter (Signed)
 Copied from CRM [id number]. Topic: Clinical - Prescription Issue >> Jun 24, 2023 12:51 PM Drema MATSU wrote: Reason for CRM: Pharmacy on [facility] doesn't have medication oxyCODONE -acetaminophen  (PERCOCET) 10-325 MG tablet [facility]. Patient states she needs it sent as soon as possible.

## 2023-07-22 ENCOUNTER — Other Ambulatory Visit: Payer: Self-pay | Admitting: Internal Medicine

## 2023-07-22 MED ORDER — OXYCODONE-ACETAMINOPHEN 10-325 MG PO TABS
1.0000 | ORAL_TABLET | Freq: Four times a day (QID) | ORAL | 0 refills | Status: DC | PRN
Start: 2023-07-22 — End: 2023-08-20

## 2023-07-22 NOTE — Telephone Encounter (Signed)
 Name of Medication: Oxycodone 10-325mg  [facility] Last Fill or Written Date and Quantity: 06-24-23 #150 Last Office Visit and Type: 05-10-23 Next Office Visit and Type: 08-10-23 Last Controlled Substance Agreement Date: 01-05-23 Last UDS: 01-05-23

## 2023-07-22 NOTE — Telephone Encounter (Signed)
 Copied from CRM [id number]. Topic: Clinical - Medication Refill >> Jul 22, 2023 10:48 AM Adele Barthel wrote: Most Recent Primary Care Visit:  Provider: Tillman Abide I  Department: [facility]  Visit Type: HOSPITAL FU  Date: 05/10/2023  Medication: oxyCODONE-acetaminophen (PERCOCET) 10-325 MG tablet   Has the patient contacted their pharmacy? Yes (Agent: If no, request that the patient contact the pharmacy for the refill. If patient does not wish to contact the pharmacy document the reason why and proceed with request.) (Agent: If yes, when and what did the pharmacy advise?)  Is this the correct pharmacy for this prescription? Yes If no, delete pharmacy and type the correct one.  This is the patient's preferred pharmacy:  [facility] [address], [place] - [address] [place] [address] Phone: [phone] Fax: [phone]  [id number] [place], [place] - [address] [facility] [address] [facility] [address] [place] [address] Phone: [phone] Fax: [phone]  [id number] [place], [place] - [address] [place] [address] Phone: [phone] Fax: [phone]  [facility] - [place], [place] - [address] [place] [address] Phone: [phone] Fax: [phone]   Has the prescription been filled recently? Yes  Is the patient out of the medication? Yes  Has the patient been seen for an appointment in the last year OR does the patient have an upcoming appointment? Yes  Can we respond through MyChart? Yes  Agent: Please be advised that Rx refills may take up to 3 business days. We ask that you follow-up with your pharmacy.

## 2023-07-26 ENCOUNTER — Other Ambulatory Visit: Payer: Self-pay | Admitting: Internal Medicine

## 2023-07-26 ENCOUNTER — Other Ambulatory Visit: Payer: Self-pay | Admitting: Family Medicine

## 2023-08-10 ENCOUNTER — Encounter: Payer: Self-pay | Admitting: Internal Medicine

## 2023-08-10 ENCOUNTER — Ambulatory Visit: Payer: Medicare HMO | Admitting: Internal Medicine

## 2023-08-10 ENCOUNTER — Encounter: Payer: Self-pay | Admitting: Pharmacist

## 2023-08-10 VITALS — BP 136/84 | HR 91 | Temp 97.9°F | Ht 66.25 in | Wt 179.0 lb

## 2023-08-10 DIAGNOSIS — Z0000 Encounter for general adult medical examination without abnormal findings: Secondary | ICD-10-CM | POA: Diagnosis not present

## 2023-08-10 DIAGNOSIS — I739 Peripheral vascular disease, unspecified: Secondary | ICD-10-CM | POA: Diagnosis not present

## 2023-08-10 DIAGNOSIS — Z794 Long term (current) use of insulin: Secondary | ICD-10-CM | POA: Diagnosis not present

## 2023-08-10 DIAGNOSIS — F39 Unspecified mood [affective] disorder: Secondary | ICD-10-CM | POA: Diagnosis not present

## 2023-08-10 DIAGNOSIS — I25119 Atherosclerotic heart disease of native coronary artery with unspecified angina pectoris: Secondary | ICD-10-CM | POA: Diagnosis not present

## 2023-08-10 DIAGNOSIS — E1159 Type 2 diabetes mellitus with other circulatory complications: Secondary | ICD-10-CM | POA: Diagnosis not present

## 2023-08-10 DIAGNOSIS — F1120 Opioid dependence, uncomplicated: Secondary | ICD-10-CM | POA: Diagnosis not present

## 2023-08-10 DIAGNOSIS — Z7984 Long term (current) use of oral hypoglycemic drugs: Secondary | ICD-10-CM | POA: Diagnosis not present

## 2023-08-10 LAB — RENAL FUNCTION PANEL
Albumin: 4.2 g/dL (ref 3.5–5.2)
BUN: 20 mg/dL (ref 6–23)
CO2: 26 meq/L (ref 19–32)
Calcium: 9.7 mg/dL (ref 8.4–10.5)
Chloride: 98 meq/L (ref 96–112)
Creatinine, Ser: 1.08 mg/dL (ref 0.40–1.20)
GFR: 48.6 mL/min — ABNORMAL LOW (ref >60.00)
Glucose, Bld: 207 mg/dL — ABNORMAL HIGH (ref 70–99)
Phosphorus: 3.7 mg/dL (ref 2.3–4.6)
Potassium: 5.4 meq/L — ABNORMAL HIGH (ref 3.5–5.1)
Sodium: 133 meq/L — ABNORMAL LOW (ref 135–145)

## 2023-08-10 LAB — CBC
HCT: 40 % (ref 36.0–46.0)
Hemoglobin: 12.9 g/dL (ref 12.0–15.0)
MCHC: 32.3 g/dL (ref 30.0–36.0)
MCV: 87.1 fl (ref 78.0–100.0)
Platelets: 345 10*3/uL (ref 150.0–400.0)
RBC: 4.6 Mil/uL (ref 3.87–5.11)
RDW: 14.4 % (ref 11.5–15.5)
WBC: 11.5 10*3/uL — ABNORMAL HIGH (ref 4.0–10.5)

## 2023-08-10 LAB — LIPID PANEL
Cholesterol: 161 mg/dL (ref 0–200)
HDL: 29.7 mg/dL — ABNORMAL LOW (ref >39.00)
NonHDL: 131.75
Total CHOL/HDL Ratio: 5
Triglycerides: 496 mg/dL — ABNORMAL HIGH (ref 0.0–149.0)
VLDL: 99.2 mg/dL — ABNORMAL HIGH (ref 0.0–40.0)

## 2023-08-10 LAB — HEPATIC FUNCTION PANEL
ALT: 8 U/L (ref 0–35)
AST: 9 U/L (ref 0–37)
Albumin: 4.2 g/dL (ref 3.5–5.2)
Alkaline Phosphatase: 107 U/L (ref 39–117)
Bilirubin, Direct: 0.1 mg/dL (ref 0.0–0.3)
Total Bilirubin: 0.2 mg/dL (ref 0.2–1.2)
Total Protein: 7.6 g/dL (ref 6.0–8.3)

## 2023-08-10 LAB — HM DIABETES FOOT EXAM

## 2023-08-10 LAB — HEMOGLOBIN A1C: Hgb A1c MFr Bld: 8.3 % — ABNORMAL HIGH (ref 4.6–6.5)

## 2023-08-10 LAB — MICROALBUMIN / CREATININE URINE RATIO
Creatinine,U: 61.4 mg/dL
Microalb Creat Ratio: 125.7 mg/g — ABNORMAL HIGH (ref 0.0–30.0)
Microalb, Ur: 7.7 mg/dL — ABNORMAL HIGH (ref 0.0–1.9)

## 2023-08-10 LAB — LDL CHOLESTEROL, DIRECT: Direct LDL: 76 mg/dL

## 2023-08-10 NOTE — Patient Instructions (Signed)
 Please get the RSV vaccine and a tetanus booster soon --at the pharmacy Get flu/COVID vaccines this fall

## 2023-08-10 NOTE — Progress Notes (Signed)
 Vision Screening   Right eye Left eye Both eyes  Without correction 20/30 20/200 20/30  With correction     Hearing Screening - Comments:: Did not pass whisper test

## 2023-08-10 NOTE — Progress Notes (Signed)
 Pt was in the office for a Medicare Wellness Exam. Stated she never heard from anyone about this. I advised her that she was contacted 3 times with no response. She stated she gets a lot of spam calls. She would like for someone to call her back. She is aware to be looking out for a call

## 2023-08-10 NOTE — Progress Notes (Signed)
 Subjective:    Patient ID: Cynthia Singh, female    DOB: 1940/10/03, 83 y.o.   MRN: [id number]  HPI Here for Medicare wellness visit and follow up of chronic health conditions With granddaughter Reviewed advanced directives Reviewed other doctors--Dr Bulakowski---opto Was hospitalized for urosepsis in December. No surgery in the past year Not able to exercise 3 falls this year---minor injuries Vision is okay Hearing is not great No alcohol No tobacco No sig memory issues---just name recall  Has ups and downs Chronic pain as always--medicine does help  Son still lives with her--son and other family do the shopping Family will cook and do all the instrumental ADLs (except occ laundry) Remains independent with ADLs---but fatigued after shower  Had brief chest pain recently---nitroglycerin did help Occasional SOB--mostly if walking for a while. Gets better with rest Lots of dizziness--no syncope No edema Sleeps on couch--flat. No PND No palpitations Chronic leg pain--some increase with activity (will have to rest)  Checks sugars twice a week Usually high 100's---but not fasting Occasional foot numbness--not painful at rest  Not depressed Enjoys TV, family get togethers, etc  Current Outpatient Medications on File Prior to Visit  Medication Sig Dispense Refill   amLODipine (NORVASC) 10 MG tablet Take 1 tablet (10 mg total) by mouth daily. 30 tablet 1   aspirin EC 81 MG tablet Take 81 mg by mouth daily.     atorvastatin (LIPITOR) 40 MG tablet TAKE 1 TABLET BY MOUTH EVERY DAY 90 tablet 3   clopidogrel (PLAVIX) 75 MG tablet TAKE 1 TABLET BY MOUTH EVERY DAY 90 tablet 3   DULoxetine (CYMBALTA) 30 MG capsule TAKE 1 CAPSULE BY MOUTH EVERY DAY 90 capsule 3   DULoxetine (CYMBALTA) 60 MG capsule TAKE 1 CAPSULE BY MOUTH EVERY DAY 90 capsule 3   insulin glargine (LANTUS SOLOSTAR) 100 UNIT/ML Solostar Pen Inject 40 Units into the skin daily. TAKE 40 UNITS DAILY UNDER SKIN 15 mL 11    Insulin Pen Needle (PEN NEEDLES) 32G X 5 MM MISC 1 Units by Does not apply route daily. 100 each 3   JARDIANCE 10 MG TABS tablet Take 10 mg by mouth daily.     meclizine (ANTIVERT) 12.5 MG tablet Take 1-2 tablets (12.5-25 mg total) by mouth 3 (three) times daily as needed for dizziness. 90 tablet 3   metFORMIN (GLUCOPHAGE-XR) 500 MG 24 hr tablet TAKE 2 TABLETS (1,000 MG TOTAL) BY MOUTH IN THE MORNING AND AT BEDTIME. 360 tablet 3   nitroGLYCERIN (NITROSTAT) 0.4 MG SL tablet Place 1 tablet (0.4 mg total) under the tongue every 5 (five) minutes as needed for chest pain. 30 tablet 0   nortriptyline (PAMELOR) 50 MG capsule TAKE 3 CAPSULES (150 MG TOTAL) BY MOUTH AT BEDTIME. 270 capsule 3   oxyCODONE-acetaminophen (PERCOCET) 10-325 MG tablet Take 1-2 tablets by mouth every 6 (six) hours as needed for pain. M54.9 150 tablet 0   polyethylene glycol (MIRALAX / GLYCOLAX) packet Take 17 g by mouth daily as needed.      No current facility-administered medications on file prior to visit.    No Known Allergies  Past Medical History:  Diagnosis Date   CAD (coronary artery disease)    a. Remote nonobstructive disease but in 10/2012 Cath/PCI: s/p DES to RCA. b. cath 02/08/15 DES to prox LAD and balloon angioplasty of ost D1, EF 55-65%; c. 08/2019 PCI/DES prox/distal RCA (overlapping prev RCA stent). Prev placed RCA/LAD stents patent.   Concussion    SAH AFTER  FALL 3/18   Depression    Diabetes mellitus, type 2 (HCC)    Dyslipidemia    Episodic mood disorder (HCC)    Hypertension    Myocardial infarction (HCC)    2014   Obesity    Osteoarthritis of spine    knees also   Peripheral vascular disease, unspecified (HCC) 2020   severe disease on home screening by insurance    Past Surgical History:  Procedure Laterality Date   ABDOMINAL HYSTERECTOMY     APPENDECTOMY  1959   CARDIAC CATHETERIZATION N/A 02/08/2015   Procedure: Left Heart Cath and Coronary Angiography;  Surgeon: Tonny Bollman, MD;   Location: [facility] INVASIVE CV LAB;  Service: Cardiovascular;  Laterality: N/A;   CARDIAC CATHETERIZATION N/A 02/08/2015   Procedure: Coronary Stent Intervention;  Surgeon: Tonny Bollman, MD;  Location: [facility] INVASIVE CV LAB;  Service: Cardiovascular;  Laterality: N/A;   CARDIAC CATHETERIZATION N/A 02/08/2015   Procedure: Intravascular Pressure Wire/FFR Study;  Surgeon: Tonny Bollman, MD;  Location: [facility] INVASIVE CV LAB;  Service: Cardiovascular;  Laterality: N/A;   CATARACT EXTRACTION W/PHACO Left 11/10/2016   Procedure: CATARACT EXTRACTION PHACO AND INTRAOCULAR LENS PLACEMENT (IOC) suture placed in left eye at end of procedure;  Surgeon: Galen Manila, MD;  Location: [facility] ORS;  Service: Ophthalmology;  Laterality: Left;  [place]  3.20 AP% 27.7 CDE 55.63 Fluid pack lot # [id number]   CHOLECYSTECTOMY     COMBINED HYSTERECTOMY ABDOMINAL W/ A&P REPAIR / OOPHORECTOMY  1968   CORONARY ANGIOPLASTY     STENTS X 5   CORONARY STENT INTERVENTION N/A 09/19/2019   Procedure: CORONARY STENT INTERVENTION;  Surgeon: Yvonne Kendall, MD;  Location: [facility] INVASIVE CV LAB;  Service: Cardiovascular;  Laterality: N/A;   CORONARY STENT PLACEMENT  2014   DILATION AND CURETTAGE OF UTERUS     GALLBLADDER SURGERY  1968   LEFT HEART CATH AND CORONARY ANGIOGRAPHY Left 09/19/2019   Procedure: LEFT HEART CATH AND CORONARY ANGIOGRAPHY;  Surgeon: Yvonne Kendall, MD;  Location: [facility] INVASIVE CV LAB;  Service: Cardiovascular;  Laterality: Left;   LEFT HEART CATHETERIZATION WITH CORONARY ANGIOGRAM N/A 10/20/2012   Procedure: LEFT HEART CATHETERIZATION WITH CORONARY ANGIOGRAM;  Surgeon: Tonny Bollman, MD;  Location: [facility] CATH LAB;  Service: Cardiovascular;  Laterality: N/A;   multiple D&C     TONSILLECTOMY  age 82    Family History  Adopted: Yes  Problem Relation Age of Onset   Cancer Brother        Colon   Cancer Son    Coronary artery disease Neg Hx    Heart attack Neg Hx     Social History   Socioeconomic History   Marital  status: Widowed    Spouse name: Not on file   Number of children: 3   Years of education: 44   Highest education level: Not on file  Occupational History   Occupation: CASHIER  Tobacco Use   Smoking status: Never    Passive exposure: Past   Smokeless tobacco: Never  Vaping Use   Vaping status: Never Used  Substance and Sexual Activity   Alcohol use: No   Drug use: No   Sexual activity: Never    Birth control/protection: Post-menopausal  Other Topics Concern   Not on file  Social History Narrative   Widowed '13. 2 sons- '63, '66; one daughter-'69;    11 grandchildren--has raised 9 of her grandchildren 4 great grandchildren.   Lives with 2 granddaughters and adoptive son  Has living will   Probably would have granddaughter Cristie Hem   Would accept resuscitation but no prolonged ventilation   Not sure about tube feeds   Social Drivers of Health   Financial Resource Strain: Medium Risk (06/24/2021)   Overall Financial Resource Strain (CARDIA)    Difficulty of Paying Living Expenses: Somewhat hard  Food Insecurity: No Food Insecurity (05/01/2023)   Hunger Vital Sign    Worried About Running Out of Food in the Last Year: Never true    Ran Out of Food in the Last Year: Never true  Transportation Needs: No Transportation Needs (05/01/2023)   PRAPARE - Administrator, Civil Service (Medical): No    Lack of Transportation (Non-Medical): No  Physical Activity: Not on file  Stress: Not on file  Social Connections: Not on file  Intimate Partner Violence: Not At Risk (05/01/2023)   Humiliation, Afraid, Rape, and Kick questionnaire    Fear of Current or Ex-Partner: No    Emotionally Abused: No    Physically Abused: No    Sexually Abused: No   Review of Systems Appetite is not great--but eats Weight has come down since hospital stay Sleeps fair--but 2- 4 times nocturia Voids okay in the day--some urgency Wears seat belt Upper plate, no teeth on bottom No  suspicious skin lesions Some heartburn--alka seltzer will help. No dysphagia Bowels move okay--no blood Occasional mild tremor    Objective:   Physical Exam Constitutional:      Appearance: Normal appearance.  HENT:     Mouth/Throat:     Pharynx: No oropharyngeal exudate or posterior oropharyngeal erythema.  Eyes:     Conjunctiva/sclera: Conjunctivae normal.     Pupils: Pupils are equal, round, and reactive to light.  Cardiovascular:     Rate and Rhythm: Normal rate and regular rhythm.     Heart sounds: No murmur heard.    No gallop.     Comments: No pedal pulses Pulmonary:     Effort: Pulmonary effort is normal.     Breath sounds: Normal breath sounds. No wheezing or rales.  Abdominal:     Palpations: Abdomen is soft.     Tenderness: There is no abdominal tenderness.  Musculoskeletal:     Cervical back: Neck supple.     Right lower leg: No edema.     Left lower leg: No edema.  Lymphadenopathy:     Cervical: No cervical adenopathy.  Skin:    Findings: No rash.     Comments: No foot lesions  Neurological:     General: No focal deficit present.     Mental Status: She is alert and oriented to person, place, and time.     Comments: Mini-cog normal Decreased sensation in plantar feet  Psychiatric:        Mood and Affect: Mood normal.        Behavior: Behavior normal.            Assessment & Plan:

## 2023-08-10 NOTE — Assessment & Plan Note (Signed)
 I have personally reviewed the Medicare Annual Wellness questionnaire and have noted 1. The patient's medical and social history 2. Their use of alcohol, tobacco or illicit drugs 3. Their current medications and supplements 4. The patient's functional ability including ADL's, fall risks, home safety risks and hearing or visual             impairment. 5. Diet and physical activities 6. Evidence for depression or mood disorders  The patients weight, height, BMI and visual acuity have been recorded in the chart I have made referrals, counseling and provided education to the patient based review of the above and I have provided the pt with a written personalized care plan for preventive services.  I have provided you with a copy of your personalized plan for preventive services. Please take the time to review along with your updated medication list.  Done with cancer screening Not able to exercise Td and RSV soon Flu/COVID vaccines in the fall

## 2023-08-10 NOTE — Assessment & Plan Note (Signed)
 PDMP reviewed No concerns

## 2023-08-10 NOTE — Assessment & Plan Note (Signed)
 Some angina--nitroglycerin helps On atorvastatin 40, ASA/plavix, jardaince

## 2023-08-10 NOTE — Assessment & Plan Note (Signed)
 Seems to have acceptable control Jardiance 10mg , lantus 40 units daily and metformin 1000 bid

## 2023-08-10 NOTE — Assessment & Plan Note (Signed)
 Some reactive depression but better now On duloxetine 90mg  daily and nortriptyline 150 bedtime (also for the back pain)

## 2023-08-10 NOTE — Progress Notes (Signed)
 Patient Assistance Program (PAP) Application   Manufacturer: Sanofi    (Re-enrollment) Medication(s): Lantus  08/10/23 Called patient x2 08/10/23. No answer, no voicemail set up.

## 2023-08-10 NOTE — Assessment & Plan Note (Signed)
 Stable claudication Limited in walking due to back pain anyway

## 2023-08-11 ENCOUNTER — Other Ambulatory Visit: Payer: Self-pay

## 2023-08-11 ENCOUNTER — Encounter: Payer: Self-pay | Admitting: Pharmacist

## 2023-08-11 MED ORDER — EMPAGLIFLOZIN 25 MG PO TABS: 25.0000 mg | ORAL_TABLET | Freq: Every day | ORAL | 11 refills | Status: AC

## 2023-08-11 NOTE — Progress Notes (Signed)
 Spoke with patient 08/11/23.   Assisted her with initiation of application for: Sanofi (Lantus) [employer] (Jardiance)  Applications mailed to her home 08/11/23. Documented in separate patient assistance encounter

## 2023-08-11 NOTE — Progress Notes (Signed)
 Patient Assistance Program (PAP) Application   Manufacturer: Sanofi    (Re-enrollment) Medication(s): Lantus  Patient Portion of Application:  2/26: Mailed to patient's home from clinic per patient request.  Income Documentation: N/A - Electronic verification elected.  Provider Portion of Application:  Will complete once patient pages received back   Patient Assistance Program (PAP) Application   Manufacturer: [employer] Cares Medication(s): Jardiance  Patient Portion of Application:  2/26: Mailed to patient's home from clinic per patient request.  Income Documentation: Patient to look for social security benefits letter.  Provider Portion of Application:  Will complete once patient pages received back  Next Steps: [x]    Application mailed to address on file (mailed 08/11/23) []    Patient preference to mail back to clinic []    Once patient pages are received from clinic, please place in PharmD inbox and sent message to PharmD Berenice Primas) []    PharmD to fill out provider portion and provide to PCP for review/signature  *[facility] clinic team - Please Addend/update this note as the "Next Steps" are completed in office*

## 2023-08-12 ENCOUNTER — Telehealth: Payer: Self-pay

## 2023-08-12 NOTE — Telephone Encounter (Addendum)
 Spoke to pt. Cynthia Singh did a patient assistance application yesterday for the Jardiance. It is being mailed to the pt. She is asking if she needs to go on something else until she can get Jardiance.

## 2023-08-12 NOTE — Telephone Encounter (Signed)
 Spoke to pt. Asked her to make sure she does the applications as soon as she receives them. May be best if someone could bring it to the office.

## 2023-08-12 NOTE — Telephone Encounter (Signed)
 Copied from CRM [id number]. Topic: Clinical - Prescription Issue >> Aug 12, 2023 10:33 AM Lennart Pall wrote: Reason for CRM: empagliflozin (JARDIANCE) 25 MG TABS tablet says it was sent to pharmacy and pharmacy still does not have the prescription

## 2023-08-12 NOTE — Telephone Encounter (Signed)
 Called and spoke to Walker at the pharmacy. They have the rx. It is $374 with insurance.

## 2023-08-20 ENCOUNTER — Other Ambulatory Visit: Payer: Self-pay | Admitting: Internal Medicine

## 2023-08-20 MED ORDER — OXYCODONE-ACETAMINOPHEN 10-325 MG PO TABS
1.0000 | ORAL_TABLET | Freq: Four times a day (QID) | ORAL | 0 refills | Status: DC | PRN
Start: 2023-08-20 — End: 2023-09-21

## 2023-08-20 NOTE — Telephone Encounter (Signed)
 Name of Medication: Oxycodone 10-325mg  [facility] Last Fill or Written Date and Quantity: 07-22-23 #150 Last Office Visit and Type: 08-10-23 Next Office Visit and Type: 11-11-23 Last Controlled Substance Agreement Date: 01-05-23 Last UDS: 01-05-23

## 2023-08-20 NOTE — Telephone Encounter (Signed)
 Copied from CRM [id number]. Topic: Clinical - Medication Refill >> Aug 20, 2023 10:55 AM Marica Otter wrote: Most Recent Primary Care Visit:  Provider: Tillman Abide I  Department: [facility]  Visit Type: PHYSICAL  Date: 08/10/2023  Medication: oxyCODONE-acetaminophen (PERCOCET) 10-325 MG tablet  Has the patient contacted their pharmacy? No, needs provider authorization (Agent: If no, request that the patient contact the pharmacy for the refill. If patient does not wish to contact the pharmacy document the reason why and proceed with request.) (Agent: If yes, when and what did the pharmacy advise?)  Is this the correct pharmacy for this prescription? Yes If no, delete pharmacy and type the correct one.  This is the patient's preferred pharmacy:  [facility] [address], [place] - [address] [place] [address] Phone: [phone] Fax: [phone]    Has the prescription been filled recently? No  Is the patient out of the medication? Yes  Has the patient been seen for an appointment in the last year OR does the patient have an upcoming appointment? Yes  Can we respond through MyChart? No  Agent: Please be advised that Rx refills may take up to 3 business days. We ask that you follow-up with your pharmacy.

## 2023-08-23 ENCOUNTER — Other Ambulatory Visit: Payer: Self-pay | Admitting: Family Medicine

## 2023-08-23 ENCOUNTER — Other Ambulatory Visit: Payer: Self-pay | Admitting: Internal Medicine

## 2023-08-31 ENCOUNTER — Ambulatory Visit

## 2023-09-21 ENCOUNTER — Other Ambulatory Visit: Payer: Self-pay | Admitting: Internal Medicine

## 2023-09-21 MED ORDER — OXYCODONE-ACETAMINOPHEN 10-325 MG PO TABS
1.0000 | ORAL_TABLET | Freq: Four times a day (QID) | ORAL | 0 refills | Status: DC | PRN
Start: 2023-09-21 — End: 2023-10-21

## 2023-09-21 NOTE — Telephone Encounter (Signed)
 Name of Medication: Oxycodone  10-325mg  [facility] Last Fill or Written Date and Quantity: 08-20-23 #150 Last Office Visit and Type: 08-10-23 Next Office Visit and Type: 11-11-23 Last Controlled Substance Agreement Date: 01-05-23 Last UDS: 01-05-23

## 2023-09-21 NOTE — Telephone Encounter (Signed)
 Last Fill: 08/20/23 150 tabs/0 RF  Last OV: 08/10/23 CPE Next OV: 11/11/23  Routing to provider for review/authorization.

## 2023-09-21 NOTE — Telephone Encounter (Signed)
 Copied from CRM [id number]. Topic: Clinical - Medication Refill >> Sep 21, 2023 11:14 AM Albertha Alosa wrote: Most Recent Primary Care Visit:  Provider: Curt Dover I  Department: [facility]  Visit Type: PHYSICAL  Date: 08/10/2023  Medication: oxyCODONE -acetaminophen  (PERCOCET) 10-325 MG tablet   Has the patient contacted their pharmacy? Yes (Agent: If no, request that the patient contact the pharmacy for the refill. If patient does not wish to contact the pharmacy document the reason why and proceed with request.) (Agent: If yes, when and what did the pharmacy advise?)  Is this the correct pharmacy for this prescription? Yes If no, delete pharmacy and type the correct one.  This is the patient's preferred pharmacy:  [facility] [address], [place] - [address] [place] [address] Phone: [phone] Fax: [phone]  [id number] [place], [place] - [address] [facility] [address] [place] [address] Phone: [phone] Fax: [phone]  [id number] [place], [place] - [address] [place] [address] Phone: [phone] Fax: [phone]    Has the prescription been filled recently? No  Is the patient out of the medication? Yes  Has the patient been seen for an appointment in the last year OR does the patient have an upcoming appointment? Yes  Can we respond through MyChart? Yes  Agent: Please be advised that Rx refills may take up to 3 business days. We ask that you follow-up with your pharmacy.

## 2023-09-22 ENCOUNTER — Other Ambulatory Visit: Payer: Self-pay | Admitting: Internal Medicine

## 2023-09-22 ENCOUNTER — Other Ambulatory Visit: Payer: Self-pay | Admitting: Family Medicine

## 2023-10-21 ENCOUNTER — Other Ambulatory Visit: Payer: Self-pay | Admitting: Family Medicine

## 2023-10-21 ENCOUNTER — Other Ambulatory Visit: Payer: Self-pay | Admitting: Internal Medicine

## 2023-10-21 NOTE — Telephone Encounter (Signed)
 Copied from CRM [id number]. Topic: Clinical - Medication Refill >> Oct 21, 2023  9:04 AM Freya Jesus wrote: Medication: oxyCODONE -acetaminophen  (PERCOCET) 10-325 MG tablet [[id number]]  Has the patient contacted their pharmacy? No (Agent: If no, request that the patient contact the pharmacy for the refill. If patient does not wish to contact the pharmacy document the reason why and proceed with request.) (Agent: If yes, when and what did the pharmacy advise?)  This is the patient's preferred pharmacy:  [facility] [address] [place] - [address] [place] [address] Phone: [phone] Fax: [phone]   Is this the correct pharmacy for this prescription? Yes If no, delete pharmacy and type the correct one.   Has the prescription been filled recently? Yes  Is the patient out of the medication? Yes  Has the patient been seen for an appointment in the last year OR does the patient have an upcoming appointment? Yes  Can we respond through MyChart? No  Agent: Please be advised that Rx refills may take up to 3 business days. We ask that you follow-up with your pharmacy.

## 2023-10-22 MED ORDER — OXYCODONE-ACETAMINOPHEN 10-325 MG PO TABS: 1.0000 | ORAL_TABLET | Freq: Four times a day (QID) | ORAL | 0 refills | Status: DC | PRN

## 2023-10-22 NOTE — Telephone Encounter (Unsigned)
 Copied from CRM [id number]. Topic: Clinical - Medication Question >> Oct 22, 2023 11:06 AM Cynthia Singh wrote: Reason for CRM: Pt is calling to check the status of her medication refill request for the  oxyCODONE -Acetaminophen . I informed the pt that the request is currently pending, pt stated that she would like for the medication to be approved today because she's in pain.

## 2023-11-11 ENCOUNTER — Ambulatory Visit: Admitting: Internal Medicine

## 2023-11-16 ENCOUNTER — Encounter: Payer: Self-pay | Admitting: Internal Medicine

## 2023-11-16 ENCOUNTER — Ambulatory Visit (INDEPENDENT_AMBULATORY_CARE_PROVIDER_SITE_OTHER): Admitting: Internal Medicine

## 2023-11-16 VITALS — BP 138/82 | HR 86 | Temp 97.5°F | Ht 66.25 in | Wt 174.0 lb

## 2023-11-16 DIAGNOSIS — E1159 Type 2 diabetes mellitus with other circulatory complications: Secondary | ICD-10-CM

## 2023-11-16 DIAGNOSIS — M545 Low back pain, unspecified: Secondary | ICD-10-CM | POA: Diagnosis not present

## 2023-11-16 DIAGNOSIS — Z7984 Long term (current) use of oral hypoglycemic drugs: Secondary | ICD-10-CM

## 2023-11-16 DIAGNOSIS — F1120 Opioid dependence, uncomplicated: Secondary | ICD-10-CM

## 2023-11-16 DIAGNOSIS — N1831 Chronic kidney disease, stage 3a: Secondary | ICD-10-CM | POA: Diagnosis not present

## 2023-11-16 DIAGNOSIS — Z794 Long term (current) use of insulin: Secondary | ICD-10-CM

## 2023-11-16 DIAGNOSIS — F39 Unspecified mood [affective] disorder: Secondary | ICD-10-CM

## 2023-11-16 DIAGNOSIS — G8929 Other chronic pain: Secondary | ICD-10-CM

## 2023-11-16 LAB — RENAL FUNCTION PANEL
Albumin: 4.2 g/dL (ref 3.5–5.2)
BUN: 23 mg/dL (ref 6–23)
CO2: 28 meq/L (ref 19–32)
Calcium: 9.6 mg/dL (ref 8.4–10.5)
Chloride: 101 meq/L (ref 96–112)
Creatinine, Ser: 1.33 mg/dL — ABNORMAL HIGH (ref 0.40–1.20)
GFR: 37.79 mL/min — ABNORMAL LOW (ref >60.00)
Glucose, Bld: 113 mg/dL — ABNORMAL HIGH (ref 70–99)
Phosphorus: 4.2 mg/dL (ref 2.3–4.6)
Potassium: 5.2 meq/L — ABNORMAL HIGH (ref 3.5–5.1)
Sodium: 138 meq/L (ref 135–145)

## 2023-11-16 LAB — POCT GLYCOSYLATED HEMOGLOBIN (HGB A1C): Hemoglobin A1C: 7.2 % — AB (ref 4.0–5.6)

## 2023-11-16 NOTE — Assessment & Plan Note (Signed)
 A1c down to 7.2% Jardiance  now 25mg , insulin  40 units daily, metformin  1000 bid

## 2023-11-16 NOTE — Addendum Note (Signed)
 Addended by: KALLIE CLOTILDA SQUIBB on: 11/16/2023 03:56 PM   Modules accepted: Orders

## 2023-11-16 NOTE — Assessment & Plan Note (Signed)
 Does okay with oxycodone  10--generally 5 per day

## 2023-11-16 NOTE — Assessment & Plan Note (Signed)
 Does okay with duloxetine  and nortriptyline 

## 2023-11-16 NOTE — Assessment & Plan Note (Signed)
 Now on increased jardiance  Will recheck renal function

## 2023-11-16 NOTE — Progress Notes (Signed)
 Subjective:    Patient ID: Cynthia Singh, female    DOB: 17-May-1941, 83 y.o.   MRN: [id number]  HPI Here for follow up of worsened diabetes and chronic pain With granddaughter  A1c was up to 8.3% last time Increased proteinuria noted and GFR down some to 48 Jardiance  increased to 25mg  daily--no apparent problems with that Ran out for a week---now back on it Didn't really notice increased frequency  Checks sugars occasionally--not great She did try improving her diet---feels she does worst due to not eating enough  Pain is about the same  Current Outpatient Medications on File Prior to Visit  Medication Sig Dispense Refill   amLODipine  (NORVASC ) 10 MG tablet Take 1 tablet (10 mg total) by mouth daily. 30 tablet 1   aspirin  EC 81 MG tablet Take 81 mg by mouth daily.     atorvastatin  (LIPITOR ) 40 MG tablet TAKE 1 TABLET BY MOUTH EVERY DAY 90 tablet 3   clopidogrel  (PLAVIX ) 75 MG tablet TAKE 1 TABLET BY MOUTH EVERY DAY 90 tablet 3   DULoxetine  (CYMBALTA ) 30 MG capsule TAKE 1 CAPSULE BY MOUTH EVERY DAY 90 capsule 3   DULoxetine  (CYMBALTA ) 60 MG capsule TAKE 1 CAPSULE BY MOUTH EVERY DAY 90 capsule 3   empagliflozin  (JARDIANCE ) 25 MG TABS tablet Take 1 tablet (25 mg total) by mouth daily before breakfast. 30 tablet 11   insulin  glargine (LANTUS  SOLOSTAR) 100 UNIT/ML Solostar Pen Inject 40 Units into the skin daily. TAKE 40 UNITS DAILY UNDER SKIN 15 mL 11   Insulin  Pen Needle (PEN NEEDLES) 32G X 5 MM MISC 1 Units by Does not apply route daily. 100 each 3   meclizine  (ANTIVERT ) 12.5 MG tablet Take 1-2 tablets (12.5-25 mg total) by mouth 3 (three) times daily as needed for dizziness. 90 tablet 3   metFORMIN  (GLUCOPHAGE -XR) 500 MG 24 hr tablet TAKE 2 TABLETS (1,000 MG TOTAL) BY MOUTH IN THE MORNING AND AT BEDTIME. 360 tablet 3   nitroGLYCERIN  (NITROSTAT ) 0.4 MG SL tablet Place 1 tablet (0.4 mg total) under the tongue every 5 (five) minutes as needed for chest pain. 30 tablet 0    nortriptyline  (PAMELOR ) 50 MG capsule TAKE 3 CAPSULES (150 MG TOTAL) BY MOUTH AT BEDTIME. 270 capsule 3   oxyCODONE -acetaminophen  (PERCOCET) 10-325 MG tablet Take 1-2 tablets by mouth every 6 (six) hours as needed for pain. M54.9 150 tablet 0   polyethylene glycol (MIRALAX  / GLYCOLAX ) packet Take 17 g by mouth daily as needed.      No current facility-administered medications on file prior to visit.    No Known Allergies  Past Medical History:  Diagnosis Date   CAD (coronary artery disease)    a. Remote nonobstructive disease but in 10/2012 Cath/PCI: s/p DES to RCA. b. cath 02/08/15 DES to prox LAD and balloon angioplasty of ost D1, EF 55-65%; c. 08/2019 PCI/DES prox/distal RCA (overlapping prev RCA stent). Prev placed RCA/LAD stents patent.   Concussion    SAH AFTER FALL 3/18   Depression    Diabetes mellitus, type 2 (HCC)    Dyslipidemia    Episodic mood disorder (HCC)    Hypertension    Myocardial infarction (HCC)    2014   Obesity    Osteoarthritis of spine    knees also   Peripheral vascular disease, unspecified (HCC) 2020   severe disease on home screening by insurance    Past Surgical History:  Procedure Laterality Date   ABDOMINAL HYSTERECTOMY  APPENDECTOMY  1959   CARDIAC CATHETERIZATION N/A 02/08/2015   Procedure: Left Heart Cath and Coronary Angiography;  Surgeon: Ozell Fell, MD;  Location: [facility] INVASIVE CV LAB;  Service: Cardiovascular;  Laterality: N/A;   CARDIAC CATHETERIZATION N/A 02/08/2015   Procedure: Coronary Stent Intervention;  Surgeon: Ozell Fell, MD;  Location: [facility] INVASIVE CV LAB;  Service: Cardiovascular;  Laterality: N/A;   CARDIAC CATHETERIZATION N/A 02/08/2015   Procedure: Intravascular Pressure Wire/FFR Study;  Surgeon: Ozell Fell, MD;  Location: [facility] INVASIVE CV LAB;  Service: Cardiovascular;  Laterality: N/A;   CATARACT EXTRACTION W/PHACO Left 11/10/2016   Procedure: CATARACT EXTRACTION PHACO AND INTRAOCULAR LENS PLACEMENT (IOC) suture placed  in left eye at end of procedure;  Surgeon: Jaye Fallow, MD;  Location: [facility] ORS;  Service: Ophthalmology;  Laterality: Left;  US   3.20 AP% 27.7 CDE 55.63 Fluid pack lot # [id number]   CHOLECYSTECTOMY     COMBINED HYSTERECTOMY ABDOMINAL W/ A&P REPAIR / OOPHORECTOMY  1968   CORONARY ANGIOPLASTY     STENTS X 5   CORONARY STENT INTERVENTION N/A 09/19/2019   Procedure: CORONARY STENT INTERVENTION;  Surgeon: Mady Bruckner, MD;  Location: [facility] INVASIVE CV LAB;  Service: Cardiovascular;  Laterality: N/A;   CORONARY STENT PLACEMENT  2014   DILATION AND CURETTAGE OF UTERUS     GALLBLADDER SURGERY  1968   LEFT HEART CATH AND CORONARY ANGIOGRAPHY Left 09/19/2019   Procedure: LEFT HEART CATH AND CORONARY ANGIOGRAPHY;  Surgeon: Mady Bruckner, MD;  Location: [facility] INVASIVE CV LAB;  Service: Cardiovascular;  Laterality: Left;   LEFT HEART CATHETERIZATION WITH CORONARY ANGIOGRAM N/A 10/20/2012   Procedure: LEFT HEART CATHETERIZATION WITH CORONARY ANGIOGRAM;  Surgeon: Ozell Fell, MD;  Location: [facility] CATH LAB;  Service: Cardiovascular;  Laterality: N/A;   multiple D&C     TONSILLECTOMY  age 80    Family History  Adopted: Yes  Problem Relation Age of Onset   Cancer Brother        Colon   Cancer Son    Coronary artery disease Neg Hx    Heart attack Neg Hx     Social History   Socioeconomic History   Marital status: Widowed    Spouse name: Not on file   Number of children: 3   Years of education: 30   Highest education level: Not on file  Occupational History   Occupation: CASHIER  Tobacco Use   Smoking status: Never    Passive exposure: Past   Smokeless tobacco: Never  Vaping Use   Vaping status: Never Used  Substance and Sexual Activity   Alcohol use: No   Drug use: No   Sexual activity: Never    Birth control/protection: Post-menopausal  Other Topics Concern   Not on file  Social History Narrative   Widowed '13. 2 sons- '63, '66; one daughter-'69;    11 grandchildren--has  raised 9 of her grandchildren 4 great grandchildren.   Lives with 2 granddaughters and adoptive son      Has living will   Probably would have granddaughter Verla   Would accept resuscitation but no prolonged ventilation   Not sure about tube feeds   Social Drivers of Health   Financial Resource Strain: Medium Risk (06/24/2021)   Overall Financial Resource Strain (CARDIA)    Difficulty of Paying Living Expenses: Somewhat hard  Food Insecurity: No Food Insecurity (05/01/2023)   Hunger Vital Sign    Worried About Running Out of Food in the Last Year: Never true  Ran Out of Food in the Last Year: Never true  Transportation Needs: No Transportation Needs (05/01/2023)   PRAPARE - Administrator, Civil Service (Medical): No    Lack of Transportation (Non-Medical): No  Physical Activity: Not on file  Stress: Not on file  Social Connections: Not on file  Intimate Partner Violence: Not At Risk (05/01/2023)   Humiliation, Afraid, Rape, and Kick questionnaire    Fear of Current or Ex-Partner: No    Emotionally Abused: No    Physically Abused: No    Sexually Abused: No   Review of Systems Has lost 5# more in 3 months Not sleeping well--trouble initiating. Sleeps 3-4 hours---then up to void    Objective:   Physical Exam Constitutional:      Appearance: Normal appearance.   Cardiovascular:     Rate and Rhythm: Normal rate and regular rhythm.     Heart sounds: No murmur heard.    No gallop.     Comments: Faint pedal pulses Pulmonary:     Effort: Pulmonary effort is normal.     Breath sounds: Normal breath sounds. No wheezing or rales.   Musculoskeletal:     Cervical back: Neck supple.     Right lower leg: No edema.     Left lower leg: No edema.  Lymphadenopathy:     Cervical: No cervical adenopathy.   Neurological:     Mental Status: She is alert.            Assessment & Plan:

## 2023-11-16 NOTE — Assessment & Plan Note (Signed)
 PDMP reviewed No concerns

## 2023-11-17 ENCOUNTER — Ambulatory Visit: Payer: Self-pay | Admitting: Internal Medicine

## 2023-11-18 ENCOUNTER — Other Ambulatory Visit: Payer: Self-pay | Admitting: Internal Medicine

## 2023-11-18 MED ORDER — OXYCODONE-ACETAMINOPHEN 10-325 MG PO TABS: 1.0000 | ORAL_TABLET | Freq: Four times a day (QID) | ORAL | 0 refills | Status: DC | PRN

## 2023-11-18 NOTE — Telephone Encounter (Signed)
 Name of Medication: Oxycodone  10-325mg  Name of [facility]acy: [facility] Last Fill or Written Date and Quantity: 10-22-23 #150 Last Office Visit and Type: 11-16-23 Next Office Visit and Type: 02-16-24 Last Controlled Substance Agreement Date: 01-05-23 Last UDS: 01-05-23

## 2023-11-18 NOTE — Telephone Encounter (Signed)
 Copied from CRM [id number]. Topic: Clinical - Medication Refill >> Nov 18, 2023 12:02 PM Franky GRADE wrote: Medication: oxyCODONE -acetaminophen  (PERCOCET) 10-325 MG tablet [[id number]]  Has the patient contacted their pharmacy? No (Agent: If no, request that the patient contact the pharmacy for the refill. If patient does not wish to contact the pharmacy document the reason why and proceed with request.) (Agent: If yes, when and what did the pharmacy advise?)  This is the patient's preferred pharmacy:  [facility] [address], [place] - [address] [place] [address] Phone: [phone] Fax: [phone]    Is this the correct pharmacy for this prescription? Yes If no, delete pharmacy and type the correct one.   Has the prescription been filled recently? No  Is the patient out of the medication? Yes  Has the patient been seen for an appointment in the last year OR does the patient have an upcoming appointment? Yes  Can we respond through MyChart? No  Agent: Please be advised that Rx refills may take up to 3 business days. We ask that you follow-up with your pharmacy.

## 2023-11-19 ENCOUNTER — Other Ambulatory Visit: Payer: Self-pay | Admitting: Internal Medicine

## 2023-12-16 ENCOUNTER — Other Ambulatory Visit: Payer: Self-pay | Admitting: Internal Medicine

## 2023-12-16 MED ORDER — OXYCODONE-ACETAMINOPHEN 10-325 MG PO TABS: 1.0000 | ORAL_TABLET | Freq: Four times a day (QID) | ORAL | 0 refills | Status: DC | PRN

## 2023-12-16 NOTE — Telephone Encounter (Signed)
 Copied from CRM [id number]. Topic: Clinical - Medication Refill >> Dec 16, 2023  9:15 AM Willma R wrote: Medication: oxyCODONE -acetaminophen  (PERCOCET) 10-325 MG tablet  Has the patient contacted their pharmacy? No, no refills  This is the patient's preferred pharmacy:  [facility] [address], [place] - [address] [place] [address] Phone: [phone] Fax: [phone]  Is this the correct pharmacy for this prescription? Yes If no, delete pharmacy and type the correct one.   Has the prescription been filled recently? No  Is the patient out of the medication? Yes  Has the patient been seen for an appointment in the last year OR does the patient have an upcoming appointment? Yes  Can we respond through MyChart? No  Agent: Please be advised that Rx refills may take up to 3 business days. We ask that you follow-up with your pharmacy.

## 2023-12-16 NOTE — Telephone Encounter (Signed)
 Name of Medication: Oxycodone  10-325mg  Name of [facility]acy: [facility] Last Fill or Written Date and Quantity: 11/18/23 #150 Last Office Visit and Type: 11/16/23 f/u Next Office Visit and Type: 02/16/24 TOC w/ Adina Last Controlled Substance Agreement Date: 01-05-23 Last UDS: 01-05-23

## 2024-01-17 ENCOUNTER — Other Ambulatory Visit: Payer: Self-pay | Admitting: Family Medicine

## 2024-01-19 ENCOUNTER — Other Ambulatory Visit: Payer: Self-pay | Admitting: Family Medicine

## 2024-01-19 ENCOUNTER — Other Ambulatory Visit: Payer: Self-pay | Admitting: Internal Medicine

## 2024-01-19 DIAGNOSIS — G8929 Other chronic pain: Secondary | ICD-10-CM

## 2024-01-19 MED ORDER — OXYCODONE-ACETAMINOPHEN 10-325 MG PO TABS: 1.0000 | ORAL_TABLET | Freq: Four times a day (QID) | ORAL | 0 refills | Status: DC | PRN

## 2024-01-19 NOTE — Telephone Encounter (Unsigned)
 Copied from CRM [id number]. Topic: Clinical - Medication Refill >> Jan 19, 2024  9:24 AM Martinique E wrote: Medication: oxyCODONE -acetaminophen  (PERCOCET) 10-325 MG tablet  Has the patient contacted their pharmacy? No (Agent: If no, request that the patient contact the pharmacy for the refill. If patient does not wish to contact the pharmacy document the reason why and proceed with request.) (Agent: If yes, when and what did the pharmacy advise?)  This is the patient's preferred pharmacy:  [facility] [address] [place] - [address] [place] [address] Phone: [phone] Fax: [phone]  Is this the correct pharmacy for this prescription? Yes If no, delete pharmacy and type the correct one.   Has the prescription been filled recently? No  Is the patient out of the medication? Yes  Has the patient been seen for an appointment in the last year OR does the patient have an upcoming appointment? Yes  Can we respond through MyChart? No  Agent: Please be advised that Rx refills may take up to 3 business days. We ask that you follow-up with your pharmacy.

## 2024-01-19 NOTE — Telephone Encounter (Signed)
 Name of Medication:  Oxycodone -APAP Name of Pharmacy:  [facility] Last Fill or Written Date and Quantity:  11/18/23 #150 Last Office Visit and Type: 11/16/23, DM & chronic pain f/u Next Office Visit and Type: 02/16/24, TOC w/ Matt Last Controlled Substance Agreement Date: 01/05/23 Last UDS: 01/05/23

## 2024-02-09 ENCOUNTER — Telehealth: Payer: Self-pay

## 2024-02-09 NOTE — Telephone Encounter (Signed)
 Patient should be able to call the pharmacy and get a refill.

## 2024-02-09 NOTE — Telephone Encounter (Signed)
 Called spoke with pt.  Pt requested for a change in pharmacy.  Advised pt to contact her pharmacy to have them transfer medication.  Pt verbalized understanding and will call the office if she has any concerns.

## 2024-02-09 NOTE — Telephone Encounter (Signed)
 LAST APPOINTMENT DATE: 11/17/2023 NEXT APPOINTMENT DATE: 02/16/2024  Nortriptyline  50 MG LAST REFILL: 07/26/2023  QTY: 270, 3RF

## 2024-02-16 ENCOUNTER — Ambulatory Visit: Admitting: Nurse Practitioner

## 2024-02-16 ENCOUNTER — Telehealth: Payer: Self-pay | Admitting: Nurse Practitioner

## 2024-02-16 ENCOUNTER — Encounter: Payer: Self-pay | Admitting: Nurse Practitioner

## 2024-02-16 VITALS — BP 138/76 | HR 83 | Temp 97.7°F | Ht 66.25 in | Wt 177.2 lb

## 2024-02-16 DIAGNOSIS — M549 Dorsalgia, unspecified: Secondary | ICD-10-CM

## 2024-02-16 DIAGNOSIS — F39 Unspecified mood [affective] disorder: Secondary | ICD-10-CM

## 2024-02-16 DIAGNOSIS — M25512 Pain in left shoulder: Secondary | ICD-10-CM

## 2024-02-16 DIAGNOSIS — I10 Essential (primary) hypertension: Secondary | ICD-10-CM

## 2024-02-16 DIAGNOSIS — N1831 Chronic kidney disease, stage 3a: Secondary | ICD-10-CM | POA: Diagnosis not present

## 2024-02-16 DIAGNOSIS — I25119 Atherosclerotic heart disease of native coronary artery with unspecified angina pectoris: Secondary | ICD-10-CM

## 2024-02-16 DIAGNOSIS — E1159 Type 2 diabetes mellitus with other circulatory complications: Secondary | ICD-10-CM | POA: Diagnosis not present

## 2024-02-16 DIAGNOSIS — G8929 Other chronic pain: Secondary | ICD-10-CM | POA: Diagnosis not present

## 2024-02-16 DIAGNOSIS — R300 Dysuria: Secondary | ICD-10-CM | POA: Diagnosis not present

## 2024-02-16 DIAGNOSIS — F1120 Opioid dependence, uncomplicated: Secondary | ICD-10-CM

## 2024-02-16 DIAGNOSIS — Z7984 Long term (current) use of oral hypoglycemic drugs: Secondary | ICD-10-CM

## 2024-02-16 DIAGNOSIS — M545 Low back pain, unspecified: Secondary | ICD-10-CM | POA: Diagnosis not present

## 2024-02-16 DIAGNOSIS — Z794 Long term (current) use of insulin: Secondary | ICD-10-CM | POA: Diagnosis not present

## 2024-02-16 DIAGNOSIS — I25110 Atherosclerotic heart disease of native coronary artery with unstable angina pectoris: Secondary | ICD-10-CM

## 2024-02-16 LAB — POCT GLYCOSYLATED HEMOGLOBIN (HGB A1C): Hemoglobin A1C: 7.1 % — AB (ref 4.0–5.6)

## 2024-02-16 MED ORDER — AMLODIPINE BESYLATE 2.5 MG PO TABS: 2.5000 mg | ORAL_TABLET | Freq: Every day | ORAL | 1 refills | Status: DC

## 2024-02-16 MED ORDER — OXYCODONE-ACETAMINOPHEN 10-325 MG PO TABS
1.0000 | ORAL_TABLET | Freq: Four times a day (QID) | ORAL | 0 refills | Status: DC | PRN
Start: 2024-02-16 — End: 2024-03-17

## 2024-02-16 NOTE — Telephone Encounter (Signed)
 Dr. Mady  I saw Cynthia Singh in office. She is having intermittent left shoulder pain. It does not seem joint related. With her history I am concerned for unstable angina. She mentions that she has seen you but it looks like it has been years from the chart. Do you want a new referral for her?  Thanks for your help  Adina BROCKS

## 2024-02-16 NOTE — Assessment & Plan Note (Signed)
 BP well controlled on amlodipine  2.5mg . BP elevated on initial check but within limits upon recheck. Contineu amlodipine  2.5mg  daily. Refill provided

## 2024-02-16 NOTE — Assessment & Plan Note (Signed)
 Maintained on duloxetine  90 mg daily along with Percocet 10-3 25 5  tablets daily.  PDMP reviewed.  Continue medication as prescribed offering some relief allows patient to function

## 2024-02-16 NOTE — Addendum Note (Signed)
 Addended by: ISADORA RAISIN on: 02/16/2024 04:15 PM   Modules accepted: Orders

## 2024-02-16 NOTE — Patient Instructions (Signed)
 Nice to see you today  I have refilled the amlodipine  2.5mg  tablet. Check to see if you have then 10mg  version at home. Let me know either way Follow up with me in 3 months  You can do your insulin  in the morning or evening but whichever you decide stick with it

## 2024-02-16 NOTE — Assessment & Plan Note (Signed)
 Ambiguous in nature unable to elicit with range of motion.  Concern for referred pain from cardiac source.  Patient with a history of CAD with 5 stents and peripheral vascular disease.  Patient to follow-up with cardiologist

## 2024-02-16 NOTE — Assessment & Plan Note (Signed)
 History of the same repeat renal function today

## 2024-02-16 NOTE — Assessment & Plan Note (Signed)
 Currently maintained on duloxetine  90 mg daily and amitriptyline  150 mg nightly.  Continue medication as prescribed.  Patient denies HI/SI/AVH.

## 2024-02-16 NOTE — Assessment & Plan Note (Signed)
 Currently maintained on Percocet PDMP reviewed.  Refill provided today.

## 2024-02-16 NOTE — Assessment & Plan Note (Signed)
 History of the same patient currently maintained on baby aspirin , clopidogrel , atorvastatin  40 mg daily.  Patient is having some ambiguous shoulder discomfort that I think is tied to cardiac.  Did message patient's former cardiologist likely will need a new referral.  Concern for unstable angina with coronary artery stenting x 5

## 2024-02-16 NOTE — Assessment & Plan Note (Signed)
 Patient currently maintained on Jardiance  25 mg daily, metformin  1000 mg BID and lantus  40 units daily. A1C well controlled

## 2024-02-16 NOTE — Progress Notes (Signed)
 Established Patient Office Visit  Subjective   Patient ID: Cynthia Singh, female    DOB: [birth year]  Age: 83 y.o. MRN: [id number]  Chief Complaint  Patient presents with   Transitions Of Care    HPI  HTN: Patient currently maintained on amlodipine  2.5 mg daily. States that she will check it infreuqntly at home. States that she does have some lightheadedness that is described as  HLD: Patient currently maintained on atorvastatin  40 mg daily along with Plavix  75 mg daily  Mood: Currently maintained on duloxetine  90 mg daily and nortriptyline  150 mg nightly. States that she has been off the nortriptyline  for approx 2 weeks. States that she did not realize that she had a refill. She will lay until 2am and then she will sleep to 10-12. States that she will wake up 1-2 hours to urinate   DM2: Patient currently maintained on metformin  1000 mg twice daily, Jardiance  25 mg daily, Lantus  40 units daily.  Last A1c well-controlled at 7.2. she does check it at home. States that she will check it 1-3 times a day. In the morning she was getting in the 80s during the day up to 180s  Chronic pain: Currently maintained on nortriptyline  150 mg nightly, duloxetine  90 mg daily, Percocet 10-3 25 1  to 2 tablets every 6 hours for pain States that she has done the injection in the knees. States that they wanted to replace but wouldn't do to sugar    Review of Systems  Constitutional:  Negative for chills and fever.  Respiratory:  Negative for shortness of breath.   Cardiovascular:  Negative for chest pain and leg swelling.  Gastrointestinal:  Negative for abdominal pain, blood in stool, constipation, diarrhea, nausea and vomiting.       Bm every 3rd or 4th day. She will use a laxative and she will use miralax   Genitourinary:  Positive for dysuria and frequency. Negative for hematuria.       Nocturia x   Neurological:  Negative for tingling (bilateral hands) and headaches.  Psychiatric/Behavioral:   Negative for hallucinations and suicidal ideas.       Objective:     BP 138/76   Pulse 83   Temp 97.7 F (36.5 C) (Oral)   Ht 5' 6.25 (1.683 m)   Wt 177 lb 3.2 oz (80.4 kg)   SpO2 96%   BMI 28.39 kg/m  BP Readings from Last 3 Encounters:  02/16/24 138/76  11/16/23 138/82  08/10/23 136/84   Wt Readings from Last 3 Encounters:  02/16/24 177 lb 3.2 oz (80.4 kg)  11/16/23 174 lb (78.9 kg)  08/10/23 179 lb (81.2 kg)   SpO2 Readings from Last 3 Encounters:  02/16/24 96%  11/16/23 98%  08/10/23 98%      Physical Exam Vitals and nursing note reviewed.  Constitutional:      Appearance: Normal appearance.  HENT:     Right Ear: Tympanic membrane, ear canal and external ear normal.     Left Ear: Ear canal and external ear normal. There is impacted cerumen.     Mouth/Throat:     Mouth: Mucous membranes are moist.     Pharynx: Oropharynx is clear.  Eyes:     Extraocular Movements: Extraocular movements intact.     Pupils: Pupils are equal, round, and reactive to light.  Cardiovascular:     Rate and Rhythm: Normal rate and regular rhythm.     Pulses: Normal pulses.     Heart sounds: Normal  heart sounds.  Pulmonary:     Effort: Pulmonary effort is normal.     Breath sounds: Normal breath sounds.  Musculoskeletal:        General: Tenderness present.     Lumbar back: Tenderness and bony tenderness present.     Right lower leg: No edema.     Left lower leg: No edema.  Lymphadenopathy:     Cervical: No cervical adenopathy.  Neurological:     General: No focal deficit present.     Mental Status: She is alert.     Comments: Bilateral upper and lower extremity strength 5/5      Results for orders placed or performed in visit on 02/16/24  POCT glycosylated hemoglobin (Hb A1C)  Result Value Ref Range   Hemoglobin A1C 7.1 (A) 4.0 - 5.6 %   HbA1c POC (<> result, manual entry)     HbA1c, POC (prediabetic range)     HbA1c, POC (controlled diabetic range)        The  ASCVD Risk score (Arnett DK, et al., 2019) failed to calculate for the following reasons:   The 2019 ASCVD risk score is only valid for ages 32 to 10   Risk score cannot be calculated because patient has a medical history suggesting prior/existing ASCVD    Assessment & Plan:   Problem List Items Addressed This Visit       Cardiovascular and Mediastinum   Type 2 diabetes mellitus with other circulatory complications (HCC)   Patient currently maintained on Jardiance  25 mg daily, metformin  1000 mg BID and lantus  40 units daily. A1C well controlled      Relevant Medications   amLODipine  (NORVASC ) 2.5 MG tablet   Hypertension   BP well controlled on amlodipine  2.5mg . BP elevated on initial check but within limits upon recheck. Contineu amlodipine  2.5mg  daily. Refill provided      Relevant Medications   amLODipine  (NORVASC ) 2.5 MG tablet   Atherosclerotic heart disease of native coronary artery with angina pectoris   History of the same patient currently maintained on baby aspirin , clopidogrel , atorvastatin  40 mg daily.  Patient is having some ambiguous shoulder discomfort that I think is tied to cardiac.  Did message patient's former cardiologist likely will need a new referral.  Concern for unstable angina with coronary artery stenting x 5      Relevant Medications   amLODipine  (NORVASC ) 2.5 MG tablet     Endocrine   Diabetes mellitus treated with insulin  and oral medication (HCC) - Primary   Relevant Orders   POCT glycosylated hemoglobin (Hb A1C) (Completed)     Genitourinary   Stage 3a chronic kidney disease (HCC)   History of the same repeat renal function today      Relevant Orders   CBC   Basic metabolic panel with GFR     Other   Mood disorder   Currently maintained on duloxetine  90 mg daily and amitriptyline  150 mg nightly.  Continue medication as prescribed.  Patient denies HI/SI/AVH.      Chronic back pain   Maintained on duloxetine  90 mg daily along with  Percocet 10-3 25 5  tablets daily.  PDMP reviewed.  Continue medication as prescribed offering some relief allows patient to function      Relevant Medications   oxyCODONE -acetaminophen  (PERCOCET) 10-325 MG tablet   Chronic narcotic dependence (HCC)   Currently maintained on Percocet PDMP reviewed.  Refill provided today.      Left shoulder pain   Ambiguous in nature  unable to elicit with range of motion.  Concern for referred pain from cardiac source.  Patient with a history of CAD with 5 stents and peripheral vascular disease.  Patient to follow-up with cardiologist      Other Visit Diagnoses       Dysuria       Relevant Orders   Urinalysis w microscopic + reflex cultur       Return in about 3 months (around 05/18/2024) for Chronic pain .    Adina Crandall, NP

## 2024-02-17 ENCOUNTER — Other Ambulatory Visit: Payer: Self-pay

## 2024-02-17 ENCOUNTER — Ambulatory Visit: Payer: Self-pay | Admitting: Nurse Practitioner

## 2024-02-17 DIAGNOSIS — N3090 Cystitis, unspecified without hematuria: Secondary | ICD-10-CM

## 2024-02-17 LAB — CBC
HCT: 38.1 % (ref 36.0–46.0)
Hemoglobin: 12 g/dL (ref 12.0–15.0)
MCHC: 31.6 g/dL (ref 30.0–36.0)
MCV: 87.8 fl (ref 78.0–100.0)
Platelets: 312 K/uL (ref 150.0–400.0)
RBC: 4.33 Mil/uL (ref 3.87–5.11)
RDW: 14.8 % (ref 11.5–15.5)
WBC: 12.3 K/uL — ABNORMAL HIGH (ref 4.0–10.5)

## 2024-02-17 LAB — URINALYSIS, ROUTINE W REFLEX MICROSCOPIC
Bilirubin Urine: NEGATIVE
Hyaline Cast: NONE SEEN /LPF
Ketones, ur: NEGATIVE
Nitrite: NEGATIVE
Specific Gravity, Urine: 1.018 (ref 1.001–1.035)
WBC, UA: >=60 /HPF — AB (ref 0–5)
pH: 7 (ref 5.0–8.0)

## 2024-02-17 LAB — BASIC METABOLIC PANEL WITH GFR
BUN: 22 mg/dL (ref 6–23)
CO2: 26 meq/L (ref 19–32)
Calcium: 9.2 mg/dL (ref 8.4–10.5)
Chloride: 98 meq/L (ref 96–112)
Creatinine, Ser: 1.53 mg/dL — ABNORMAL HIGH (ref 0.40–1.20)
GFR: 31.88 mL/min — ABNORMAL LOW (ref >60.00)
Glucose, Bld: 175 mg/dL — ABNORMAL HIGH (ref 70–99)
Potassium: 4.4 meq/L (ref 3.5–5.1)
Sodium: 134 meq/L — ABNORMAL LOW (ref 135–145)

## 2024-02-17 LAB — MICROSCOPIC MESSAGE

## 2024-02-17 MED ORDER — CEPHALEXIN 500 MG PO CAPS: 500.0000 mg | ORAL_CAPSULE | Freq: Two times a day (BID) | ORAL | 0 refills | Status: DC

## 2024-02-17 NOTE — Addendum Note (Signed)
 Addended by: WENDEE LYNWOOD HERO on: 02/17/2024 01:01 PM   Modules accepted: Orders

## 2024-02-18 MED ORDER — NORTRIPTYLINE HCL 50 MG PO CAPS: 150.0000 mg | ORAL_CAPSULE | Freq: Every day | ORAL | 3 refills | Status: AC

## 2024-03-10 ENCOUNTER — Ambulatory Visit: Admitting: Internal Medicine

## 2024-03-17 ENCOUNTER — Other Ambulatory Visit: Payer: Self-pay | Admitting: Nurse Practitioner

## 2024-03-17 DIAGNOSIS — G8929 Other chronic pain: Secondary | ICD-10-CM

## 2024-03-17 MED ORDER — OXYCODONE-ACETAMINOPHEN 10-325 MG PO TABS: 1.0000 | ORAL_TABLET | Freq: Four times a day (QID) | ORAL | 0 refills | Status: DC | PRN

## 2024-03-17 NOTE — Telephone Encounter (Signed)
 Copied from CRM [id number]. Topic: Clinical - Medication Refill >> Mar 17, 2024  9:25 AM Franky GRADE wrote: Medication: oxyCODONE -acetaminophen  (PERCOCET) 10-325 MG tablet [[id number]]  Has the patient contacted their pharmacy? No (Agent: If no, request that the patient contact the pharmacy for the refill. If patient does not wish to contact the pharmacy document the reason why and proceed with request.) (Agent: If yes, when and what did the pharmacy advise?)  This is the patient's preferred pharmacy:  [facility] [address] [place] - [address] [place] [address] Phone: [phone] Fax: [phone]    Is this the correct pharmacy for this prescription? Yes If no, delete pharmacy and type the correct one.   Has the prescription been filled recently? No  Is the patient out of the medication? Yes  Has the patient been seen for an appointment in the last year OR does the patient have an upcoming appointment? Yes  Can we respond through MyChart? No  Agent: Please be advised that Rx refills may take up to 3 business days. We ask that you follow-up with your pharmacy.

## 2024-03-29 ENCOUNTER — Ambulatory Visit: Admitting: Internal Medicine

## 2024-03-31 ENCOUNTER — Other Ambulatory Visit: Payer: Self-pay

## 2024-03-31 ENCOUNTER — Emergency Department

## 2024-03-31 ENCOUNTER — Emergency Department
Admission: EM | Admit: 2024-03-31 | Discharge: 2024-03-31 | Discharge status: Against Medical Advice | Attending: Emergency Medicine | Admitting: Emergency Medicine

## 2024-03-31 DIAGNOSIS — M545 Low back pain, unspecified: Secondary | ICD-10-CM | POA: Diagnosis present

## 2024-03-31 DIAGNOSIS — M25552 Pain in left hip: Secondary | ICD-10-CM | POA: Diagnosis not present

## 2024-03-31 DIAGNOSIS — M25551 Pain in right hip: Secondary | ICD-10-CM | POA: Insufficient documentation

## 2024-03-31 DIAGNOSIS — Z5321 Procedure and treatment not carried out due to patient leaving prior to being seen by health care provider: Secondary | ICD-10-CM | POA: Diagnosis not present

## 2024-03-31 DIAGNOSIS — W19XXXA Unspecified fall, initial encounter: Secondary | ICD-10-CM | POA: Insufficient documentation

## 2024-03-31 NOTE — ED Triage Notes (Signed)
 Pt to ED for c/o fall through the night. Pt fell three times in the last four days. Pt denies hitting head, denies LOC, not on thinners. Pt states lower back and bilateral hip pain.

## 2024-03-31 NOTE — ED Notes (Signed)
 Pt family member came back into lobby and started fussing at Luann stating we let an 83 yr old sit here for three hours with no care. He also stated that it was our job to get the pt to do the scan. Family informed that we can't make the pt do something they are refusing. Family walked out mumbling

## 2024-03-31 NOTE — ED Notes (Signed)
 Pt encouraged again to perform xrays. Pt states she can't lay down. Pt asked if she can stand and have it done.

## 2024-03-31 NOTE — ED Notes (Addendum)
 Family states pt is going home. Pt pushed out in wheelchair with several family members with her.

## 2024-04-11 ENCOUNTER — Other Ambulatory Visit: Payer: Self-pay | Admitting: Family

## 2024-04-17 NOTE — Telephone Encounter (Signed)
 Copied from CRM #[id number]. Topic: Clinical - Medication Refill >> Apr 17, 2024  9:09 AM Olam RAMAN wrote: Medication: oxyCODONE -acetaminophen  (PERCOCET) 10-325 MG tablet   Has the patient contacted their pharmacy? No (Agent: If no, request that the patient contact the pharmacy for the refill. If patient does not wish to contact the pharmacy document the reason why and proceed with request.) (Agent: If yes, when and what did the pharmacy advise?)  This is the patient's preferred pharmacy:  [facility] [address], [place] - [address] [place] [address] Phone: [phone] Fax: [phone]    Is this the correct pharmacy for this prescription? Yes If no, delete pharmacy and type the correct one.   Has the prescription been filled recently? Yes  Is the patient out of the medication? Yes  Has the patient been seen for an appointment in the last year OR does the patient have an upcoming appointment? Yes  Can we respond through MyChart? No  Agent: Please be advised that Rx refills may take up to 3 business days. We ask that you follow-up with your pharmacy.

## 2024-04-18 ENCOUNTER — Ambulatory Visit: Payer: Self-pay | Admitting: Nurse Practitioner

## 2024-04-18 ENCOUNTER — Ambulatory Visit: Payer: Self-pay

## 2024-04-18 DIAGNOSIS — G8929 Other chronic pain: Secondary | ICD-10-CM

## 2024-04-18 NOTE — Telephone Encounter (Signed)
 FYI Only or Action Required?: Action required by provider: medication refill request.  Patient was last seen in primary care on 02/16/2024 by Wendee Lynwood HERO, NP.  Called Nurse Triage reporting Back Pain.  Symptoms began yesterday.  Interventions attempted: Nothing.  Symptoms are: unchanged.  Triage Disposition: See PCP When Office is Open (Within 3 Days)  Patient/caregiver understands and will follow disposition?: No, wishes to speak with PCP  Copied from CRM #[id number]. Topic: Clinical - Red Word Triage >> Apr 18, 2024 11:51 AM Eva FALCON wrote: Red Word that prompted transfer to Nurse Triage: Severe pain in back and legs. Reason for Disposition  [1] MODERATE back pain (e.g., interferes with normal activities) AND [2] present > 3 days  Answer Assessment - Initial Assessment Questions Patient called for refill, oxycodone , declines appt and reports have appt next month.  Patient reports completely out of medication.  Advised call back or ED if symptoms worsen.  1. ONSET: When did the pain begin? (e.g., minutes, hours, days)     Last taken med yesterday , ran out of med 2. LOCATION: Where does it hurt? (upper, mid or lower back)     Lower back pain, bilateral  3. SEVERITY: How bad is the pain?  (e.g., Scale 1-10; mild, moderate, or severe) 7/10      4. PATTERN: Is the pain constant? (e.g., yes, no; constant, intermittent)      constant 5. RADIATION: Does the pain shoot into your legs or somewhere else?     Bilateral legs 6. CAUSE:  What do you think is causing the back pain?      Chronic back pain 8. MEDICINES: What have you taken so far for the pain? (e.g., nothing, acetaminophen , NSAIDS)     Nothing today, out of med 9. NEUROLOGIC SYMPTOMS: Do you have any weakness, numbness, or problems with bowel/bladder control?     denies 10. OTHER SYMPTOMS: Do you have any other symptoms? (e.g., fever, abdomen pain, burning with urination, blood in  urine) denies  Protocols used: Back Pain-A-AH

## 2024-04-19 ENCOUNTER — Ambulatory Visit: Attending: Internal Medicine | Admitting: Internal Medicine

## 2024-04-19 MED ORDER — OXYCODONE-ACETAMINOPHEN 10-325 MG PO TABS: 1.0000 | ORAL_TABLET | Freq: Four times a day (QID) | ORAL | 0 refills | Status: DC | PRN

## 2024-04-19 NOTE — Progress Notes (Deleted)
  Cardiology Office Note:  .   Date:  04/19/2024  ID:  Cynthia Singh, DOB [birth year], MRN [id number] PCP: Wendee Lynwood HERO, NP  [facility] Providers Cardiologist:  Lonni Hanson, MD { Click to update primary MD,subspecialty MD or APP then REFRESH:1}    History of Present Illness: .   Cynthia Singh is a 83 y.o. female with history of coronary artery disease status post multiple PCI's, type 2 diabetes mellitus, bilateral subdural hematomas in the setting of mechanical fall in 2018 while on dual antiplatelet therapy, hypertension, hyperlipidemia, dilated ascending aorta, and osteoarthritis, who presents to reestablish cardiac care.  I last saw her in 09/2019 following hospitalization for shoulder/upper back pain and associated elevated troponin that was managed medically at the patient's request.  Subsequent MPI was low risk.  Coronary CTA demonstrated moderate proximal and severe distal RCA disease as well as moderate to severe LCx stenoses; she subsequently underwent stenting of the proximal and distal RCA with resolution of her symptoms.  She has been lost to follow-up since then.  ROS: See HPI  Studies Reviewed: [name]        LHC/PCI (09/19/2019): Multivessel coronary artery disease, including 30-40% proximal and mid LAD stenoses flanking patent stent, 70% mid LCx stenosis, and sequential 50% proximal and 95% distal RCA stenoses.  There is also moderate to severe disease involving the rPL branch. Widely patent proximal/mid LAD stent. Patent mid RCA stent with 10-20% in-stent restenosis. Successful PCI to proximal and distal RCA using Synergy 3.0 x 16 mm (proximal) and 3.0 x 32 mm (distal) drug-eluting stents, which overlap with the previously placed mid RCA stent. Diagnostic Dominance: Right  Intervention   Risk Assessment/Calculations:   {Does this patient have ATRIAL FIBRILLATION?:[phone]} No BP recorded.  {Refresh Note OR Click here to enter BP  :1}***       Physical  Exam:   VS:  There were no vitals taken for this visit.   Wt Readings from Last 3 Encounters:  02/16/24 177 lb 3.2 oz (80.4 kg)  11/16/23 174 lb (78.9 kg)  08/10/23 179 lb (81.2 kg)    General:  NAD. Neck: No JVD or HJR. Lungs: Clear to auscultation bilaterally without wheezes or crackles. Heart: Regular rate and rhythm without murmurs, rubs, or gallops. Abdomen: Soft, nontender, nondistended. Extremities: No lower extremity edema.  ASSESSMENT AND PLAN: .    ***    {Are you ordering a CV Procedure (e.g. stress test, cath, DCCV, TEE, etc)?   Press F2        :[id number]}  Dispo: ***  Signed, Lonni Hanson, MD

## 2024-04-19 NOTE — Telephone Encounter (Signed)
 Medication refilled

## 2024-04-20 ENCOUNTER — Encounter: Payer: Self-pay | Admitting: Internal Medicine

## 2024-04-26 ENCOUNTER — Other Ambulatory Visit: Payer: Self-pay | Admitting: Family

## 2024-04-28 ENCOUNTER — Emergency Department

## 2024-04-28 ENCOUNTER — Inpatient Hospital Stay

## 2024-04-28 ENCOUNTER — Inpatient Hospital Stay
Admission: EM | Admit: 2024-04-28 | Discharge: 2024-05-04 | DRG: 871 | Disposition: A | Discharge status: Skilled Nursing Facility | Attending: Internal Medicine | Admitting: Internal Medicine

## 2024-04-28 ENCOUNTER — Other Ambulatory Visit: Payer: Self-pay

## 2024-04-28 DIAGNOSIS — S32019A Unspecified fracture of first lumbar vertebra, initial encounter for closed fracture: Secondary | ICD-10-CM | POA: Diagnosis present

## 2024-04-28 DIAGNOSIS — R6521 Severe sepsis with septic shock: Secondary | ICD-10-CM | POA: Diagnosis present

## 2024-04-28 DIAGNOSIS — W19XXXA Unspecified fall, initial encounter: Secondary | ICD-10-CM | POA: Diagnosis present

## 2024-04-28 DIAGNOSIS — D649 Anemia, unspecified: Secondary | ICD-10-CM | POA: Diagnosis present

## 2024-04-28 DIAGNOSIS — E871 Hypo-osmolality and hyponatremia: Secondary | ICD-10-CM | POA: Diagnosis present

## 2024-04-28 DIAGNOSIS — E1140 Type 2 diabetes mellitus with diabetic neuropathy, unspecified: Secondary | ICD-10-CM | POA: Diagnosis present

## 2024-04-28 DIAGNOSIS — B9620 Unspecified Escherichia coli [E. coli] as the cause of diseases classified elsewhere: Secondary | ICD-10-CM | POA: Diagnosis not present

## 2024-04-28 DIAGNOSIS — K219 Gastro-esophageal reflux disease without esophagitis: Secondary | ICD-10-CM | POA: Diagnosis present

## 2024-04-28 DIAGNOSIS — R197 Diarrhea, unspecified: Secondary | ICD-10-CM | POA: Diagnosis present

## 2024-04-28 DIAGNOSIS — N136 Pyonephrosis: Secondary | ICD-10-CM | POA: Diagnosis present

## 2024-04-28 DIAGNOSIS — Z7984 Long term (current) use of oral hypoglycemic drugs: Secondary | ICD-10-CM

## 2024-04-28 DIAGNOSIS — I342 Nonrheumatic mitral (valve) stenosis: Secondary | ICD-10-CM | POA: Diagnosis not present

## 2024-04-28 DIAGNOSIS — A4151 Sepsis due to Escherichia coli [E. coli]: Secondary | ICD-10-CM | POA: Diagnosis present

## 2024-04-28 DIAGNOSIS — Z794 Long term (current) use of insulin: Secondary | ICD-10-CM | POA: Diagnosis not present

## 2024-04-28 DIAGNOSIS — R7989 Other specified abnormal findings of blood chemistry: Secondary | ICD-10-CM | POA: Diagnosis present

## 2024-04-28 DIAGNOSIS — E1122 Type 2 diabetes mellitus with diabetic chronic kidney disease: Secondary | ICD-10-CM | POA: Diagnosis present

## 2024-04-28 DIAGNOSIS — Y92009 Unspecified place in unspecified non-institutional (private) residence as the place of occurrence of the external cause: Secondary | ICD-10-CM

## 2024-04-28 DIAGNOSIS — J9811 Atelectasis: Secondary | ICD-10-CM | POA: Diagnosis present

## 2024-04-28 DIAGNOSIS — E119 Type 2 diabetes mellitus without complications: Secondary | ICD-10-CM

## 2024-04-28 DIAGNOSIS — Z7902 Long term (current) use of antithrombotics/antiplatelets: Secondary | ICD-10-CM | POA: Diagnosis not present

## 2024-04-28 DIAGNOSIS — Z1152 Encounter for screening for COVID-19: Secondary | ICD-10-CM

## 2024-04-28 DIAGNOSIS — E1151 Type 2 diabetes mellitus with diabetic peripheral angiopathy without gangrene: Secondary | ICD-10-CM | POA: Diagnosis present

## 2024-04-28 DIAGNOSIS — E1169 Type 2 diabetes mellitus with other specified complication: Secondary | ICD-10-CM | POA: Diagnosis present

## 2024-04-28 DIAGNOSIS — N1831 Chronic kidney disease, stage 3a: Secondary | ICD-10-CM | POA: Diagnosis present

## 2024-04-28 DIAGNOSIS — A4150 Gram-negative sepsis, unspecified: Secondary | ICD-10-CM | POA: Diagnosis not present

## 2024-04-28 DIAGNOSIS — I129 Hypertensive chronic kidney disease with stage 1 through stage 4 chronic kidney disease, or unspecified chronic kidney disease: Secondary | ICD-10-CM | POA: Diagnosis present

## 2024-04-28 DIAGNOSIS — Z79899 Other long term (current) drug therapy: Secondary | ICD-10-CM

## 2024-04-28 DIAGNOSIS — E663 Overweight: Secondary | ICD-10-CM | POA: Diagnosis present

## 2024-04-28 DIAGNOSIS — M4326 Fusion of spine, lumbar region: Secondary | ICD-10-CM | POA: Diagnosis present

## 2024-04-28 DIAGNOSIS — E860 Dehydration: Secondary | ICD-10-CM | POA: Diagnosis present

## 2024-04-28 DIAGNOSIS — A419 Sepsis, unspecified organism: Secondary | ICD-10-CM

## 2024-04-28 DIAGNOSIS — N12 Tubulo-interstitial nephritis, not specified as acute or chronic: Principal | ICD-10-CM

## 2024-04-28 DIAGNOSIS — Z7982 Long term (current) use of aspirin: Secondary | ICD-10-CM

## 2024-04-28 DIAGNOSIS — I252 Old myocardial infarction: Secondary | ICD-10-CM

## 2024-04-28 DIAGNOSIS — E872 Acidosis, unspecified: Secondary | ICD-10-CM | POA: Diagnosis present

## 2024-04-28 DIAGNOSIS — M4854XA Collapsed vertebra, not elsewhere classified, thoracic region, initial encounter for fracture: Secondary | ICD-10-CM | POA: Diagnosis present

## 2024-04-28 DIAGNOSIS — N1370 Vesicoureteral-reflux, unspecified: Secondary | ICD-10-CM | POA: Diagnosis present

## 2024-04-28 DIAGNOSIS — Z91148 Patient's other noncompliance with medication regimen for other reason: Secondary | ICD-10-CM

## 2024-04-28 DIAGNOSIS — G9341 Metabolic encephalopathy: Secondary | ICD-10-CM | POA: Diagnosis present

## 2024-04-28 DIAGNOSIS — E7849 Other hyperlipidemia: Secondary | ICD-10-CM | POA: Diagnosis present

## 2024-04-28 DIAGNOSIS — Z955 Presence of coronary angioplasty implant and graft: Secondary | ICD-10-CM

## 2024-04-28 DIAGNOSIS — N390 Urinary tract infection, site not specified: Secondary | ICD-10-CM | POA: Diagnosis not present

## 2024-04-28 DIAGNOSIS — I2510 Atherosclerotic heart disease of native coronary artery without angina pectoris: Secondary | ICD-10-CM | POA: Diagnosis present

## 2024-04-28 DIAGNOSIS — F32A Depression, unspecified: Secondary | ICD-10-CM | POA: Diagnosis present

## 2024-04-28 DIAGNOSIS — B3749 Other urogenital candidiasis: Secondary | ICD-10-CM | POA: Diagnosis present

## 2024-04-28 DIAGNOSIS — Z9049 Acquired absence of other specified parts of digestive tract: Secondary | ICD-10-CM

## 2024-04-28 DIAGNOSIS — N179 Acute kidney failure, unspecified: Secondary | ICD-10-CM | POA: Diagnosis present

## 2024-04-28 DIAGNOSIS — Z6828 Body mass index (BMI) 28.0-28.9, adult: Secondary | ICD-10-CM

## 2024-04-28 DIAGNOSIS — Z90710 Acquired absence of both cervix and uterus: Secondary | ICD-10-CM

## 2024-04-28 DIAGNOSIS — Z59868 Other specified financial insecurity: Secondary | ICD-10-CM

## 2024-04-28 LAB — BLOOD CULTURE ID PANEL (REFLEXED) - BCID2

## 2024-04-28 LAB — LACTIC ACID, PLASMA
Lactic Acid, Venous: 4.6 mmol/L (ref 0.5–1.9)
Lactic Acid, Venous: 2.8 mmol/L (ref 0.5–1.9)
Lactic Acid, Venous: 1.9 mmol/L (ref 0.5–1.9)

## 2024-04-28 LAB — RESP PANEL BY RT-PCR (RSV, FLU A&B, COVID)  RVPGX2
Influenza A by PCR: NEGATIVE
Influenza B by PCR: NEGATIVE
Resp Syncytial Virus by PCR: NEGATIVE
SARS Coronavirus 2 by RT PCR: NEGATIVE

## 2024-04-28 LAB — CBC WITH DIFFERENTIAL/PLATELET
Abs Immature Granulocytes: 2.44 K/uL — ABNORMAL HIGH (ref 0.00–0.07)
Basophils Absolute: 0.1 K/uL (ref 0.0–0.1)
Basophils Relative: 0 %
Eosinophils Absolute: 0.1 K/uL (ref 0.0–0.5)
Eosinophils Relative: 0 %
HCT: 35.6 % — ABNORMAL LOW (ref 36.0–46.0)
Hemoglobin: 11.4 g/dL — ABNORMAL LOW (ref 12.0–15.0)
Immature Granulocytes: 8 %
Lymphocytes Relative: 3 %
Lymphs Abs: 0.9 K/uL (ref 0.7–4.0)
MCH: 28.6 pg (ref 26.0–34.0)
MCHC: 32 g/dL (ref 30.0–36.0)
MCV: 89.2 fL (ref 80.0–100.0)
Monocytes Absolute: 1.5 K/uL — ABNORMAL HIGH (ref 0.1–1.0)
Monocytes Relative: 5 %
Neutro Abs: 25.4 K/uL — ABNORMAL HIGH (ref 1.7–7.7)
Neutrophils Relative %: 84 %
Platelets: 181 K/uL (ref 150–400)
RBC: 3.99 MIL/uL (ref 3.87–5.11)
RDW: 15.3 % (ref 11.5–15.5)
Smear Review: NORMAL
WBC: 30.3 K/uL — ABNORMAL HIGH (ref 4.0–10.5)
nRBC: 0 % (ref 0.0–0.2)

## 2024-04-28 LAB — LIPASE, BLOOD: Lipase: <10 U/L — ABNORMAL LOW (ref 11–51)

## 2024-04-28 LAB — URINALYSIS, W/ REFLEX TO CULTURE (INFECTION SUSPECTED)
Bilirubin Urine: NEGATIVE
Glucose, UA: >=500 mg/dL — AB
Ketones, ur: NEGATIVE mg/dL
Nitrite: POSITIVE — AB
Protein, ur: 100 mg/dL — AB
RBC / HPF: >50 RBC/hpf (ref 0–5)
Specific Gravity, Urine: 1.013 (ref 1.005–1.030)
WBC, UA: >50 WBC/hpf (ref 0–5)
pH: 6 (ref 5.0–8.0)

## 2024-04-28 LAB — COMPREHENSIVE METABOLIC PANEL WITH GFR
ALT: 71 U/L — ABNORMAL HIGH (ref 0–44)
AST: 45 U/L — ABNORMAL HIGH (ref 15–41)
Albumin: 3 g/dL — ABNORMAL LOW (ref 3.5–5.0)
Alkaline Phosphatase: 165 U/L — ABNORMAL HIGH (ref 38–126)
Anion gap: 20 — ABNORMAL HIGH (ref 5–15)
BUN: 55 mg/dL — ABNORMAL HIGH (ref 8–23)
CO2: 15 mmol/L — ABNORMAL LOW (ref 22–32)
Calcium: 7.7 mg/dL — ABNORMAL LOW (ref 8.9–10.3)
Chloride: 99 mmol/L (ref 98–111)
Creatinine, Ser: 3.27 mg/dL — ABNORMAL HIGH (ref 0.44–1.00)
GFR, Estimated: 14 mL/min — ABNORMAL LOW (ref >60)
Glucose, Bld: 179 mg/dL — ABNORMAL HIGH (ref 70–99)
Potassium: 4.7 mmol/L (ref 3.5–5.1)
Sodium: 134 mmol/L — ABNORMAL LOW (ref 135–145)
Total Bilirubin: 0.7 mg/dL (ref 0.0–1.2)
Total Protein: 6.3 g/dL — ABNORMAL LOW (ref 6.5–8.1)

## 2024-04-28 LAB — PROTIME-INR
INR: 1.4 — ABNORMAL HIGH (ref 0.8–1.2)
Prothrombin Time: 17.9 s — ABNORMAL HIGH (ref 11.4–15.2)

## 2024-04-28 LAB — APTT: aPTT: 39 s — ABNORMAL HIGH (ref 24–36)

## 2024-04-28 LAB — MAGNESIUM: Magnesium: 1.5 mg/dL — ABNORMAL LOW (ref 1.7–2.4)

## 2024-04-28 LAB — CBG MONITORING, ED: Glucose-Capillary: 146 mg/dL — ABNORMAL HIGH (ref 70–99)

## 2024-04-28 MED ORDER — HEPARIN SODIUM (PORCINE) 5000 UNIT/ML IJ SOLN
5000.0000 [IU] | Freq: Three times a day (TID) | INTRAMUSCULAR | Status: DC
  Administered 2024-04-28 – 2024-05-04 (×18): 5000 [IU] via SUBCUTANEOUS
  Filled 2024-04-28 (×18): qty 1

## 2024-04-28 MED ORDER — PANTOPRAZOLE SODIUM 40 MG IV SOLR
40.0000 mg | Freq: Once | INTRAVENOUS | Status: AC
  Administered 2024-04-28: 40 mg via INTRAVENOUS
  Filled 2024-04-28: qty 10

## 2024-04-28 MED ORDER — INSULIN ASPART 100 UNIT/ML IJ SOLN
0.0000 [IU] | Freq: Three times a day (TID) | INTRAMUSCULAR | Status: DC
  Filled 2024-04-28: qty 1

## 2024-04-28 MED ORDER — METRONIDAZOLE 500 MG/100ML IV SOLN
500.0000 mg | Freq: Once | INTRAVENOUS | Status: AC
  Administered 2024-04-28: 500 mg via INTRAVENOUS
  Filled 2024-04-28: qty 100

## 2024-04-28 MED ORDER — SODIUM CHLORIDE 0.9 % IV BOLUS (SEPSIS)
500.0000 mL | Freq: Once | INTRAVENOUS | Status: AC
  Administered 2024-04-28: 500 mL via INTRAVENOUS

## 2024-04-28 MED ORDER — ONDANSETRON HCL 4 MG/2ML IJ SOLN
4.0000 mg | Freq: Once | INTRAMUSCULAR | Status: AC
  Administered 2024-04-28: 4 mg via INTRAVENOUS
  Filled 2024-04-28: qty 2

## 2024-04-28 MED ORDER — SODIUM CHLORIDE 0.9% FLUSH
3.0000 mL | Freq: Two times a day (BID) | INTRAVENOUS | Status: DC
  Administered 2024-04-28 – 2024-05-04 (×12): 3 mL via INTRAVENOUS

## 2024-04-28 MED ORDER — ACETAMINOPHEN 325 MG PO TABS
650.0000 mg | ORAL_TABLET | Freq: Four times a day (QID) | ORAL | Status: DC | PRN
  Administered 2024-04-30: 650 mg via ORAL
  Filled 2024-04-28: qty 2

## 2024-04-28 MED ORDER — ACETAMINOPHEN 650 MG RE SUPP: 650.0000 mg | Freq: Four times a day (QID) | RECTAL | Status: DC | PRN

## 2024-04-28 MED ORDER — HYDROMORPHONE HCL 1 MG/ML IJ SOLN
0.2500 mg | INTRAMUSCULAR | Status: DC | PRN
  Administered 2024-04-28 – 2024-04-30 (×2): 0.25 mg via INTRAVENOUS
  Filled 2024-04-28 (×2): qty 0.5

## 2024-04-28 MED ORDER — OXYCODONE HCL 5 MG PO TABS
5.0000 mg | ORAL_TABLET | ORAL | Status: DC | PRN
  Administered 2024-05-01: 14:00:00 5 mg via ORAL
  Filled 2024-04-28: qty 1

## 2024-04-28 MED ORDER — SODIUM CHLORIDE 0.9 % IV SOLN
2.0000 g | INTRAVENOUS | Status: DC
  Administered 2024-04-28 – 2024-05-03 (×6): 2 g via INTRAVENOUS
  Filled 2024-04-28 (×8): qty 20

## 2024-04-28 MED ORDER — SODIUM CHLORIDE 0.9 % IV SOLN
2.0000 g | Freq: Once | INTRAVENOUS | Status: AC
  Administered 2024-04-28: 2 g via INTRAVENOUS
  Filled 2024-04-28: qty 12.5

## 2024-04-28 MED ORDER — LACTATED RINGERS IV SOLN: INTRAVENOUS | Status: AC

## 2024-04-28 MED ORDER — OXYCODONE HCL 5 MG PO TABS
5.0000 mg | ORAL_TABLET | Freq: Once | ORAL | Status: AC
  Administered 2024-04-28: 5 mg via ORAL
  Filled 2024-04-28: qty 1

## 2024-04-28 MED ORDER — SENNOSIDES-DOCUSATE SODIUM 8.6-50 MG PO TABS: 1.0000 | ORAL_TABLET | Freq: Every evening | ORAL | Status: DC | PRN

## 2024-04-28 MED ORDER — SODIUM CHLORIDE 0.9 % IV BOLUS (SEPSIS)
1000.0000 mL | Freq: Once | INTRAVENOUS | Status: AC
  Administered 2024-04-28: 1000 mL via INTRAVENOUS

## 2024-04-28 MED FILL — Ondansetron HCl Inj 4 MG/2ML (2 MG/ML): 4.0000 mg | INTRAMUSCULAR | Qty: 2 | Status: AC

## 2024-04-28 NOTE — ED Notes (Signed)
 Assumed care of pt from [facility]. Introduced self to pt. Pt has call bell in reach.

## 2024-04-28 NOTE — ED Triage Notes (Signed)
 Pt to ED via [facility] for c/o N/V/D for 2-3 days.Pt tachycardic and tachypneic. HR in 110s. Given 1000 NS by EMS.

## 2024-04-28 NOTE — ED Notes (Signed)
 Notified by previous RN that 2nd set of cultures were not drawn prior to abx being given. 2nd set collected at this time by this RN.

## 2024-04-28 NOTE — Progress Notes (Signed)
 Orthopedic Tech Progress Note Patient Details:  Cynthia Singh Jul 10, 1940 [id number] ED called about TLSO for this patient that is being admitted. We explained to her we don't call in hanger orders unless the patient is getting discharged. Nurse or diplomatic services operational officer understood. Patient ID: Cynthia Singh, female   DOB: Dec 28, 1940, 83 y.o.   MRN: [id number]  [name] 04/28/2024, 9:03 PM

## 2024-04-28 NOTE — Progress Notes (Signed)
 PHARMACY - PHYSICIAN COMMUNICATION CRITICAL VALUE ALERT - BLOOD CULTURE IDENTIFICATION (BCID)  Results for orders placed or performed during the hospital encounter of 04/28/24  Resp panel by RT-PCR (RSV, Flu A&B, Covid) Anterior Nasal Swab     Status: None   Collection Time: 04/28/24  8:30 AM   Specimen: Anterior Nasal Swab  Result Value Ref Range Status   SARS Coronavirus 2 by RT PCR NEGATIVE NEGATIVE Final    Comment: (NOTE) SARS-CoV-2 target nucleic acids are NOT DETECTED.  The SARS-CoV-2 RNA is generally detectable in upper respiratory specimens during the acute phase of infection. The lowest concentration of SARS-CoV-2 viral copies this assay can detect is 138 copies/mL. A negative result does not preclude SARS-Cov-2 infection and should not be used as the sole basis for treatment or other patient management decisions. A negative result may occur with  improper specimen collection/handling, submission of specimen other than nasopharyngeal swab, presence of viral mutation(s) within the areas targeted by this assay, and inadequate number of viral copies(<138 copies/mL). A negative result must be combined with clinical observations, patient history, and epidemiological information. The expected result is Negative.  Fact Sheet for Patients:  bloggercourse.com  Fact Sheet for Healthcare Providers:  seriousbroker.it  This test is no t yet approved or cleared by the [place]  FDA and  has been authorized for detection and/or diagnosis of SARS-CoV-2 by FDA under an Emergency Use Authorization (EUA). This EUA will remain  in effect (meaning this test can be used) for the duration of the COVID-19 declaration under Section 564(b)(1) of the Act, 21 U.S.C.section 360bbb-3(b)(1), unless the authorization is terminated  or revoked sooner.       Influenza A by PCR NEGATIVE NEGATIVE Final   Influenza B by PCR NEGATIVE NEGATIVE Final     Comment: (NOTE) The Xpert Xpress SARS-CoV-2/FLU/RSV plus assay is intended as an aid in the diagnosis of influenza from Nasopharyngeal swab specimens and should not be used as a sole basis for treatment. Nasal washings and aspirates are unacceptable for Xpert Xpress SARS-CoV-2/FLU/RSV testing.  Fact Sheet for Patients: bloggercourse.com  Fact Sheet for Healthcare Providers: seriousbroker.it  This test is not yet approved or cleared by the [place]  FDA and has been authorized for detection and/or diagnosis of SARS-CoV-2 by FDA under an Emergency Use Authorization (EUA). This EUA will remain in effect (meaning this test can be used) for the duration of the COVID-19 declaration under Section 564(b)(1) of the Act, 21 U.S.C. section 360bbb-3(b)(1), unless the authorization is terminated or revoked.     Resp Syncytial Virus by PCR NEGATIVE NEGATIVE Final    Comment: (NOTE) Fact Sheet for Patients: bloggercourse.com  Fact Sheet for Healthcare Providers: seriousbroker.it  This test is not yet approved or cleared by the [place]  FDA and has been authorized for detection and/or diagnosis of SARS-CoV-2 by FDA under an Emergency Use Authorization (EUA). This EUA will remain in effect (meaning this test can be used) for the duration of the COVID-19 declaration under Section 564(b)(1) of the Act, 21 U.S.C. section 360bbb-3(b)(1), unless the authorization is terminated or revoked.  Performed at [facility], [address] [place], [place] [address]   Blood Culture (routine x 2)     Status: None (Preliminary result)   Collection Time: 04/28/24  8:38 AM   Specimen: BLOOD  Result Value Ref Range Status   Specimen Description BLOOD BLOOD LEFT ARM  Final   Special Requests   Final    BOTTLES DRAWN AEROBIC AND  ANAEROBIC Blood Culture adequate volume   Culture  Setup  Time   Final    Organism ID to follow IN BOTH AEROBIC AND ANAEROBIC BOTTLES GRAM NEGATIVE RODS CRITICAL RESULT CALLED TO, READ BACK BY AND VERIFIED WITH: Performed at [facility], [address], [place], [place] [address]    Culture GRAM NEGATIVE RODS  Final   Report Status PENDING  Incomplete    BCID Results: 1 (aerobic) of 4 bottles with E. Coli, no resistance.  Pt currently on Ceftriaxone  2 gm q24hr.  Name of provider contacted: DOROTHA Peaches, MD   Changes to prescribed antibiotics required: No change needed at this time.  Rankin CANDIE Dills, PharmD, [facility] 04/28/2024 10:43 PM

## 2024-04-28 NOTE — ED Notes (Signed)
 Viviann, MD, made aware of lactic 4.6

## 2024-04-28 NOTE — ED Provider Notes (Addendum)
 [facility] Provider Note    Event Date/Time   First MD Initiated Contact with Patient 04/28/24 0825     (approximate)   History   Chief Complaint: Nausea, Emesis (di), and Diarrhea   HPI  Cynthia Singh is a 83 y.o. female with a history of diabetes, hypertension, GERD, CKD who comes ED complaining of nausea vomiting diarrhea for the past 3 days, not able to eat or drink anything.  Feels dehydrated and weak.  Denies any black or bloody stool or emesis.  No fever.  Reports generalized abdominal pain   Denies any recent hospitalization or antibiotics.  No history of C. difficile infection.     Past Medical History:  Diagnosis Date   CAD (coronary artery disease)    a. Remote nonobstructive disease but in 10/2012 Cath/PCI: s/p DES to RCA. b. cath 02/08/15 DES to prox LAD and balloon angioplasty of ost D1, EF 55-65%; c. 08/2019 PCI/DES prox/distal RCA (overlapping prev RCA stent). Prev placed RCA/LAD stents patent.   Concussion    [facility] AFTER FALL 3/18   Depression    Diabetes mellitus, type 2 (HCC)    Dyslipidemia    Episodic mood disorder    Hypertension    Myocardial infarction [facility])    2014   Obesity    Osteoarthritis of spine    knees also   Peripheral vascular disease, unspecified 2020   severe disease on home screening by insurance    Current Outpatient Rx   Order #: [id number] Class: Normal   Order #: [id number] Class: Historical Med   Order #: [id number] Class: Normal   Order #: [id number] Class: Normal   Order #: [id number] Class: Normal   Order #: [id number] Class: Normal   Order #: [id number] Class: Normal   Order #: [id number] Class: Normal   Order #: [id number] Class: Normal   Order #: [id number] Class: Normal   Order #: [id number] Class: Normal   Order #: [id number] Class: Normal   Order #: [id number] Class: Normal   Order #: [id number] Class: Normal   Order #: [id number] Class: Normal   Order #: [id number] Class: Historical Med    Past Surgical  History:  Procedure Laterality Date   ABDOMINAL HYSTERECTOMY     APPENDECTOMY  1959   CARDIAC CATHETERIZATION N/A 02/08/2015   Procedure: Left Heart Cath and Coronary Angiography;  Surgeon: Ozell Fell, MD;  Location: [facility] INVASIVE CV LAB;  Service: Cardiovascular;  Laterality: N/A;   CARDIAC CATHETERIZATION N/A 02/08/2015   Procedure: Coronary Stent Intervention;  Surgeon: Ozell Fell, MD;  Location: [facility] INVASIVE CV LAB;  Service: Cardiovascular;  Laterality: N/A;   CARDIAC CATHETERIZATION N/A 02/08/2015   Procedure: Intravascular Pressure Wire/FFR Study;  Surgeon: Ozell Fell, MD;  Location: [facility] INVASIVE CV LAB;  Service: Cardiovascular;  Laterality: N/A;   CATARACT EXTRACTION W/PHACO Left 11/10/2016   Procedure: CATARACT EXTRACTION PHACO AND INTRAOCULAR LENS PLACEMENT (IOC) suture placed in left eye at end of procedure;  Surgeon: Jaye Fallow, MD;  Location: [facility] ORS;  Service: Ophthalmology;  Laterality: Left;  US   3.20 AP% 27.7 CDE 55.63 Fluid pack lot # [id number]   CHOLECYSTECTOMY     COMBINED HYSTERECTOMY ABDOMINAL W/ A&P REPAIR / OOPHORECTOMY  1968   CORONARY ANGIOPLASTY     STENTS X 5   CORONARY STENT INTERVENTION N/A 09/19/2019   Procedure: CORONARY STENT INTERVENTION;  Surgeon: Mady Bruckner, MD;  Location: [facility] INVASIVE CV LAB;  Service: Cardiovascular;  Laterality: N/A;   CORONARY STENT PLACEMENT  2014   DILATION AND CURETTAGE OF UTERUS     GALLBLADDER  SURGERY  1968   LEFT HEART CATH AND CORONARY ANGIOGRAPHY Left 09/19/2019   Procedure: LEFT HEART CATH AND CORONARY ANGIOGRAPHY;  Surgeon: Mady Bruckner, MD;  Location: [facility] INVASIVE CV LAB;  Service: Cardiovascular;  Laterality: Left;   LEFT HEART CATHETERIZATION WITH CORONARY ANGIOGRAM N/A 10/20/2012   Procedure: LEFT HEART CATHETERIZATION WITH CORONARY ANGIOGRAM;  Surgeon: Ozell Fell, MD;  Location: [facility] CATH LAB;  Service: Cardiovascular;  Laterality: N/A;   multiple D&C     TONSILLECTOMY  age 3    Physical Exam    Triage Vital Signs: ED Triage Vitals  Encounter Vitals Group     BP      Girls Systolic BP Percentile      Girls Diastolic BP Percentile      Boys Systolic BP Percentile      Boys Diastolic BP Percentile      Pulse      Resp      Temp      Temp src      SpO2      Weight      Height      Head Circumference      Peak Flow      Pain Score      Pain Loc      Pain Education      Exclude from Growth Chart     Most recent vital signs: Vitals:   04/28/24 0827 04/28/24 1107  BP: (!) 144/56 (!) 142/62  Pulse: (!) 104 (!) 101  Resp: (!) 25 15  Temp: 98.6 F (37 C) 98.4 F (36.9 C)  SpO2: 97% 99%    General: Awake, no distress.  CV:  Good peripheral perfusion.  Tachycardia heart rate 105 Resp:  Normal effort.  Clear lungs Abd:  No distention.  Soft with generalized tenderness.  No peritonitis. Other:  Dry oral mucosa   ED Results / Procedures / Treatments   Labs (all labs ordered are listed, but only abnormal results are displayed) Labs Reviewed  LACTIC ACID, PLASMA - Abnormal; Notable for the following components:      Result Value   Lactic Acid, Venous 4.6 (*)    All other components within normal limits  LACTIC ACID, PLASMA - Abnormal; Notable for the following components:   Lactic Acid, Venous 2.8 (*)    All other components within normal limits  COMPREHENSIVE METABOLIC PANEL WITH GFR - Abnormal; Notable for the following components:   Sodium 134 (*)    CO2 15 (*)    Glucose, Bld 179 (*)    BUN 55 (*)    Creatinine, Ser 3.27 (*)    Calcium  7.7 (*)    Total Protein 6.3 (*)    Albumin 3.0 (*)    AST 45 (*)    ALT 71 (*)    Alkaline Phosphatase 165 (*)    GFR, Estimated 14 (*)    Anion gap 20 (*)    All other components within normal limits  CBC WITH DIFFERENTIAL/PLATELET - Abnormal; Notable for the following components:   WBC 30.3 (*)    Hemoglobin 11.4 (*)    HCT 35.6 (*)    Neutro Abs 25.4 (*)    Monocytes Absolute 1.5 (*)    Abs Immature  Granulocytes 2.44 (*)    All other components within normal limits  LIPASE, BLOOD - Abnormal; Notable for the following components:   Lipase <10 (*)    All other components within normal limits  URINALYSIS, W/ REFLEX TO CULTURE (  INFECTION SUSPECTED) - Abnormal; Notable for the following components:   Color, Urine AMBER (*)    APPearance TURBID (*)    Glucose, UA >=500 (*)    Hgb urine dipstick LARGE (*)    Protein, ur 100 (*)    Nitrite POSITIVE (*)    Leukocytes,Ua MODERATE (*)    Bacteria, UA MANY (*)    Non Squamous Epithelial PRESENT (*)    All other components within normal limits  RESP PANEL BY RT-PCR (RSV, FLU A&B, COVID)  [id number]  CULTURE, BLOOD (ROUTINE X 2)  CULTURE, BLOOD (ROUTINE X 2)  C DIFFICILE QUICK SCREEN W PCR REFLEX    GASTROINTESTINAL PANEL BY PCR, STOOL (REPLACES STOOL CULTURE)     EKG  Interpreted by me Sinus tachycardia rate 103.  Normal axis, mildly prolonged QTc of 520 ms.  LVH.  No acute ischemic changes.  RADIOLOGY  Chest x-ray interpreted by me, unremarkable.  Radiology report reviewed  PROCEDURES:  .Critical Care  Performed by: Viviann Pastor, MD Authorized by: Viviann Pastor, MD   Critical care provider statement:    Critical care time (minutes):  35   Critical care time was exclusive of:  Separately billable procedures and treating other patients   Critical care was necessary to treat or prevent imminent or life-threatening deterioration of the following conditions:  Sepsis and dehydration   Critical care was time spent personally by me on the following activities:  Development of treatment plan with patient or surrogate, discussions with consultants, evaluation of patient's response to treatment, examination of patient, obtaining history from patient or surrogate, ordering and performing treatments and interventions, ordering and review of laboratory studies, ordering and review of radiographic studies, pulse oximetry, re-evaluation of  patient's condition and review of old charts   Care discussed with: admitting provider      MEDICATIONS ORDERED IN ED: Medications  sodium chloride  0.9 % bolus 1,000 mL (0 mLs Intravenous Stopped 04/28/24 0912)  ondansetron  (ZOFRAN ) injection 4 mg (4 mg Intravenous Given 04/28/24 0842)  pantoprazole  (PROTONIX ) injection 40 mg (40 mg Intravenous Given 04/28/24 0842)  sodium chloride  0.9 % bolus 500 mL (0 mLs Intravenous Stopped 04/28/24 1051)  ceFEPIme  (MAXIPIME ) 2 g in sodium chloride  0.9 % 100 mL IVPB (0 g Intravenous Stopped 04/28/24 1022)  metroNIDAZOLE  (FLAGYL ) IVPB 500 mg (0 mg Intravenous Stopped 04/28/24 1136)     IMPRESSION / MDM / ASSESSMENT AND PLAN / ED COURSE  I reviewed the triage vital signs and the nursing notes.  DDx: AKI, electrolyte derangement, dehydration, COVID, influenza, UTI, diverticulitis, GI perforation  Patient's presentation is most consistent with acute presentation with potential threat to life or bodily function.  Patient presents with nausea vomiting diarrhea, appears dehydrated.  Has tachycardia, tachypnea, will obtain lactate to screen for sepsis.  ----------------------------------------- 10:38 AM on 04/28/2024 ----------------------------------------- Leukocytosis of 30,000, lactate 4.6.  Additional fluids ordered for 30 mL liter per kilogram total bolus.  Cefepime  Flagyl  ordered, stool studies ordered.  Awaiting CT read, will plan to admit.   ----------------------------------------- 11:54 AM on 04/28/2024 ----------------------------------------- CT concerning for pyelonephritis.  Urine sample grossly purulent.  Has history of E. coli UTI complicated by bacteremia.  Lactate is improving.  Blood pressure remains stable, no signs of refractory shock.  Will contact hospitalist for admission.  Clinical Course as of 04/28/24 1237  Fri Apr 28, 2024  1236 Case d/w hospitalist [PS]    Clinical Course User Index [PS] Viviann Pastor, MD      FINAL CLINICAL IMPRESSION(S) /  ED DIAGNOSES   Final diagnoses:  Pyelonephritis  Sepsis with acute renal failure and septic shock, due to unspecified organism, unspecified acute renal failure type (HCC)  Type 2 diabetes mellitus without complication, with long-term current use of insulin  (HCC)     Rx / DC Orders   ED Discharge Orders     None        Note:  This document was prepared using Dragon voice recognition software and may include unintentional dictation errors.   Viviann Pastor, MD 04/28/24 1154    Viviann Pastor, MD 04/28/24 [phone]

## 2024-04-28 NOTE — H&P (Addendum)
 History and Physical    Cynthia Singh FMW:[id number] DOB: 1941-04-16 DOA: 04/28/2024  DOS: the patient was seen and examined on 04/28/2024  PCP: Wendee Lynwood HERO, NP   Patient coming from: Home  I have personally briefly reviewed patient's old medical records in [facility] Link and CareEverywhere  HPI:   Cynthia Singh is a 83 y.o. year old female with medical history of hypertension, hyperlipidemia, type 2 diabetes, CKD 3A presenting to the ED with nausea, vomiting, diarrhea and generalized weakness.  Patient reports she has been having nausea and vomiting along with abdominal pain since yesterday.  Reports chills but no recorded fevers.  Pain is suprapubic.   When asked further questions, she is unable to answer them. She is oriented to person, but not to time and place. She states she lives with her two sons. I called contact on file which is her grandson. He states pt lives with her son and him. She uses a walker to walk. Has no cognitive dysfunction at baseline but has been confused since yesterday. He noted blood in her urine yesterday and also her complaint of nausea and vomiting prompting him to bring her to the ED. He believes she has not been taking her medications for the last few days.   On arrival to the ED patient was noted to be HDS stable.  Lab work and imaging obtained. CBC with leukocytosis at 30.3, mild anemia at 11.4 baseline around 12.  CMP with AKI, elevated liver function and anion gap metabolic acidosis along with mild hyponatremia.  Lactic acid checked and initially 4.6 with repeat at 2.8 after IVF.  UTI concerning for urinary tract infection with positive nitrites and leukocyte esterase.  EDP ordered GI panel and C. difficile.  Chest x-ray with atelectasis and CT abdomen pelvis shows mild left renal pyelectasis and ureterectasis with gas in the renal collecting system with concern for VUR.  No renal calculi seen.  Given patient presentation, [facility] contacted for  admission.  Review of Systems: As mentioned in the history of present illness. All other systems reviewed and are negative.   Past Medical History:  Diagnosis Date   CAD (coronary artery disease)    a. Remote nonobstructive disease but in 10/2012 Cath/PCI: s/p DES to RCA. b. cath 02/08/15 DES to prox LAD and balloon angioplasty of ost D1, EF 55-65%; c. 08/2019 PCI/DES prox/distal RCA (overlapping prev RCA stent). Prev placed RCA/LAD stents patent.   Concussion    SAH AFTER FALL 3/18   Depression    Diabetes mellitus, type 2 (HCC)    Dyslipidemia    Episodic mood disorder    Hypertension    Myocardial infarction (HCC)    2014   Obesity    Osteoarthritis of spine    knees also   Peripheral vascular disease, unspecified 2020   severe disease on home screening by insurance    Past Surgical History:  Procedure Laterality Date   ABDOMINAL HYSTERECTOMY     APPENDECTOMY  1959   CARDIAC CATHETERIZATION N/A 02/08/2015   Procedure: Left Heart Cath and Coronary Angiography;  Surgeon: Ozell Fell, MD;  Location: [facility] INVASIVE CV LAB;  Service: Cardiovascular;  Laterality: N/A;   CARDIAC CATHETERIZATION N/A 02/08/2015   Procedure: Coronary Stent Intervention;  Surgeon: Ozell Fell, MD;  Location: [facility] INVASIVE CV LAB;  Service: Cardiovascular;  Laterality: N/A;   CARDIAC CATHETERIZATION N/A 02/08/2015   Procedure: Intravascular Pressure Wire/FFR Study;  Surgeon: Ozell Fell, MD;  Location: [facility] INVASIVE CV LAB;  Service: Cardiovascular;  Laterality: N/A;   CATARACT EXTRACTION W/PHACO Left 11/10/2016   Procedure: CATARACT EXTRACTION PHACO AND INTRAOCULAR LENS PLACEMENT (IOC) suture placed in left eye at end of procedure;  Surgeon: Jaye Fallow, MD;  Location: [facility] ORS;  Service: Ophthalmology;  Laterality: Left;  US   3.20 AP% 27.7 CDE 55.63 Fluid pack lot # [id number]   CHOLECYSTECTOMY     COMBINED HYSTERECTOMY ABDOMINAL W/ A&P REPAIR / OOPHORECTOMY  1968   CORONARY ANGIOPLASTY     STENTS  X 5   CORONARY STENT INTERVENTION N/A 09/19/2019   Procedure: CORONARY STENT INTERVENTION;  Surgeon: Mady Bruckner, MD;  Location: [facility] INVASIVE CV LAB;  Service: Cardiovascular;  Laterality: N/A;   CORONARY STENT PLACEMENT  2014   DILATION AND CURETTAGE OF UTERUS     GALLBLADDER SURGERY  1968   LEFT HEART CATH AND CORONARY ANGIOGRAPHY Left 09/19/2019   Procedure: LEFT HEART CATH AND CORONARY ANGIOGRAPHY;  Surgeon: Mady Bruckner, MD;  Location: [facility] INVASIVE CV LAB;  Service: Cardiovascular;  Laterality: Left;   LEFT HEART CATHETERIZATION WITH CORONARY ANGIOGRAM N/A 10/20/2012   Procedure: LEFT HEART CATHETERIZATION WITH CORONARY ANGIOGRAM;  Surgeon: Ozell Fell, MD;  Location: [facility] CATH LAB;  Service: Cardiovascular;  Laterality: N/A;   multiple D&C     TONSILLECTOMY  age 33     Allergies[1]  Family History  Adopted: Yes  Problem Relation Age of Onset   Cancer Brother        Colon   Cancer Son    Coronary artery disease Neg Hx    Heart attack Neg Hx     Prior to Admission medications  Medication Sig Start Date End Date Taking? Authorizing Provider  amLODipine  (NORVASC ) 2.5 MG tablet Take 1 tablet (2.5 mg total) by mouth daily. 02/16/24   Wendee Lynwood HERO, NP  aspirin  EC 81 MG tablet Take 81 mg by mouth daily.    [provider]  atorvastatin  (LIPITOR ) 40 MG tablet TAKE 1 TABLET BY MOUTH EVERY DAY 05/13/23   Jimmy Charlie FERNS, MD  cephALEXin  (KEFLEX ) 500 MG capsule Take 1 capsule (500 mg total) by mouth 2 (two) times daily. 02/17/24   Wendee Lynwood HERO, NP  clopidogrel  (PLAVIX ) 75 MG tablet TAKE 1 TABLET BY MOUTH EVERY DAY 07/26/23   Jimmy Charlie FERNS, MD  DULoxetine  (CYMBALTA ) 30 MG capsule TAKE 1 CAPSULE BY MOUTH EVERY DAY 04/26/24   Wendee Lynwood HERO, NP  DULoxetine  (CYMBALTA ) 60 MG capsule TAKE 1 CAPSULE BY MOUTH EVERY DAY 11/02/22   Jimmy Charlie FERNS, MD  empagliflozin  (JARDIANCE ) 25 MG TABS tablet Take 1 tablet (25 mg total) by mouth daily before breakfast. 08/11/23   Jimmy Charlie FERNS, MD  insulin  glargine (LANTUS  SOLOSTAR) 100 UNIT/ML Solostar Pen Inject 40 Units into the skin daily. TAKE 40 UNITS DAILY UNDER SKIN 05/10/23   Jimmy Charlie FERNS, MD  Insulin  Pen Needle (PEN NEEDLES) 32G X 5 MM MISC 1 Units by Does not apply route daily. 03/24/22   Jimmy Charlie FERNS, MD  meclizine  (ANTIVERT ) 12.5 MG tablet Take 1-2 tablets (12.5-25 mg total) by mouth 3 (three) times daily as needed for dizziness. 01/05/23   Jimmy Charlie FERNS, MD  metFORMIN  (GLUCOPHAGE -XR) 500 MG 24 hr tablet TAKE 2 TABLETS (1,000 MG TOTAL) BY MOUTH IN THE MORNING AND AT BEDTIME. 04/17/24   Wendee Lynwood HERO, NP  nitroGLYCERIN  (NITROSTAT ) 0.4 MG SL tablet Place 1 tablet (0.4 mg total) under the tongue every 5 (five) minutes as needed for chest pain. 08/24/19  Jimmy Charlie FERNS, MD  nortriptyline  (PAMELOR ) 50 MG capsule Take 3 capsules (150 mg total) by mouth at bedtime. 02/18/24   Wendee Lynwood HERO, NP  oxyCODONE -acetaminophen  (PERCOCET) 10-325 MG tablet Take 1-2 tablets by mouth every 6 (six) hours as needed for pain. M54.9 04/19/24   Wendee Lynwood HERO, NP  polyethylene glycol (MIRALAX  / GLYCOLAX ) packet Take 17 g by mouth daily as needed.     [provider]    Social History:  reports that she has never smoked. She has been exposed to tobacco smoke. She has never used smokeless tobacco. She reports that she does not drink alcohol and does not use drugs. Lives with 2 sons. Doesn't smoke or drink. Uses a walker and can do ADLs but needs assistance with iADLs.    Physical Exam: Vitals:   04/28/24 0827 04/28/24 0832 04/28/24 1107  BP: (!) 144/56  (!) 142/62  Pulse: (!) 104  (!) 101  Resp: (!) 25  15  Temp: 98.6 F (37 C)  98.4 F (36.9 C)  TempSrc:   Oral  SpO2: 97%  99%  Weight:  76.3 kg   Height:  5' 8 (1.727 m)     Gen: NAD HENT: NCAT CV: sinus tachycardia, good pulses, 3/6 systolic murmur Lung: CTAB Abd: TTP in suprapubic region, normal bowel sounds MSK: No asymmetry, good bulk and  tone Neuro: alert and oriented  Labs on Admission: I have personally reviewed following labs and imaging studies  CBC: Recent Labs  Lab 04/28/24 0838  WBC 30.3*  NEUTROABS 25.4*  HGB 11.4*  HCT 35.6*  MCV 89.2  PLT 181   Basic Metabolic Panel: Recent Labs  Lab 04/28/24 0838  NA 134*  K 4.7  CL 99  CO2 15*  GLUCOSE 179*  BUN 55*  CREATININE 3.27*  CALCIUM  7.7*  MG 1.5*   GFR: Estimated Creatinine Clearance: 13.8 mL/min (A) (by C-G formula based on SCr of 3.27 mg/dL (H)). Liver Function Tests: Recent Labs  Lab 04/28/24 0838  AST 45*  ALT 71*  ALKPHOS 165*  BILITOT 0.7  PROT 6.3*  ALBUMIN 3.0*   Recent Labs  Lab 04/28/24 0838  LIPASE <10*   No results for input(s): AMMONIA in the last 168 hours. Coagulation Profile: No results for input(s): INR, PROTIME in the last 168 hours. Cardiac Enzymes: No results for input(s): CKTOTAL, CKMB, CKMBINDEX, TROPONINI, TROPONINIHS in the last 168 hours. BNP (last 3 results) No results for input(s): BNP in the last 8760 hours. HbA1C: No results for input(s): HGBA1C in the last 72 hours. CBG: No results for input(s): GLUCAP in the last 168 hours. Lipid Profile: No results for input(s): CHOL, HDL, LDLCALC, TRIG, CHOLHDL, LDLDIRECT in the last 72 hours. Thyroid Function Tests: No results for input(s): TSH, T4TOTAL, FREET4, T3FREE, THYROIDAB in the last 72 hours. Anemia Panel: No results for input(s): VITAMINB12, FOLATE, FERRITIN, TIBC, IRON , RETICCTPCT in the last 72 hours. Urine analysis:    Component Value Date/Time   COLORURINE AMBER (A) 04/28/2024 1103   APPEARANCEUR TURBID (A) 04/28/2024 1103   LABSPEC 1.013 04/28/2024 1103   PHURINE 6.0 04/28/2024 1103   GLUCOSEU >=500 (A) 04/28/2024 1103   HGBUR LARGE (A) 04/28/2024 1103   BILIRUBINUR NEGATIVE 04/28/2024 1103   BILIRUBINUR neg 06/14/2019 1408   KETONESUR NEGATIVE 04/28/2024 1103   PROTEINUR 100 (A)  04/28/2024 1103   UROBILINOGEN 0.2 06/14/2019 1408   UROBILINOGEN 1.0 10/19/2012 1030   NITRITE POSITIVE (A) 04/28/2024 1103   LEUKOCYTESUR MODERATE (A)  04/28/2024 1103    Radiological Exams on Admission: I have personally reviewed images CT ABDOMEN PELVIS WO CONTRAST Result Date: 04/28/2024 CLINICAL DATA:  Diffuse abdominal pain.  Sepsis. EXAM: CT ABDOMEN AND PELVIS WITHOUT CONTRAST TECHNIQUE: Multidetector CT imaging of the abdomen and pelvis was performed following the standard protocol without IV contrast. RADIATION DOSE REDUCTION: This exam was performed according to the departmental dose-optimization program which includes automated exposure control, adjustment of the mA and/or kV according to patient size and/or use of iterative reconstruction technique. COMPARISON:  10/10/2010 FINDINGS: Lower chest: No acute findings. Hepatobiliary: No mass visualized on this unenhanced exam. Prior cholecystectomy. No evidence of biliary obstruction. Pancreas: No mass or inflammatory process visualized on this unenhanced exam. Spleen:  Within normal limits in size. Adrenals/Urinary tract: No evidence of urolithiasis. Mild left renal pyelectasis and ureterectasis is seen with gas in the renal collecting systems, most likely due to vesicoureteral reflux. Small amount of gas also seen in urinary bladder, likely from recent instrumentation. Stomach/Bowel: No evidence of small bowel obstruction, inflammatory process, or abnormal fluid collections. Diffuse gaseous distention of colon is seen with moderate stool burden is, which may be due to ileus or constipation. Vascular/Lymphatic: No pathologically enlarged lymph nodes identified. No evidence of abdominal aortic aneurysm. Reproductive: Prior hysterectomy noted. Adnexal regions are unremarkable in appearance. Other:  None. Musculoskeletal:  No suspicious bone lesions identified. IMPRESSION: Mild left renal pyelectasis and ureterectasis with gas in the renal collecting  system, most likely due to vesicoureteral reflux. No evidence of ureteral calculus. Gas in urinary bladder. Recommend clinical correlation for recent instrumentation. Diffuse gaseous distention of colon with moderate stool burden, which may be due to ileus or constipation. Electronically Signed   By: Norleen DELENA Kil M.D.   On: 04/28/2024 11:15   DG Chest Port 1 View Result Date: 04/28/2024 CLINICAL DATA:  Questionable sepsis.  Tachycardia. EXAM: PORTABLE CHEST 1 VIEW COMPARISON:  04/30/2023 FINDINGS: Rotated film. The cardio pericardial silhouette is enlarged. Atelectasis or infiltrate noted at the right base. Soft tissue fullness noted in the right hilar region with volume loss noted right hemithorax, likely accentuated by rotation. Bones are diffusely demineralized with degenerative changes in each shoulder. IMPRESSION: 1. Atelectasis or infiltrate at the right base. 2. Soft tissue fullness in the right hilar region with volume loss in the right hemithorax, likely accentuated by rotation. Dedicated upright PA and lateral chest x-ray recommended when patient is able. Electronically Signed   By: Camellia Candle M.D.   On: 04/28/2024 08:55    EKG: My personal interpretation of EKG shows: Sinus tachycardia without any acute ST changes.  Left bundle branch block ST rhythm present.  Assessment/Plan Principal Problem:   Sepsis due to gram-negative UTI (HCC) Active Problems:   CAD (coronary artery disease)   Hyperlipidemia associated with type 2 diabetes mellitus (HCC)   Type 2 diabetes mellitus (HCC)   GERD (gastroesophageal reflux disease)   Stage 3a chronic kidney disease (HCC)   Sepsis 2/2 to UTI Pt presented with complaints of n/v and suprapubic pain found to have UTI. There is hematuria consistent with UTI. Pt meets sepsis criteria.  Previous culture data reviewed and she was susceptible to ceftriaxone  at that time.  Previous hospitalization was complicated by bacteremia where blood cultures grew E.  coli which was susceptible to ceftriaxone .  Urine and blood culture pending. Pt started on IV Ceftriaxone  1 g daily. There is some left CVA tenderness but CT does not show any fat stranding to suggest pyelonephritis. There  is some VUR evidence.  -Continue current abx therapy -Follow up cultures -Monitor fever curve and leukocytosis  Acute encephalopathy: Likely secondary to UTI.  Will treat her UTI and monitor mental status.  Probably contributing as her AKI as well.  Treating that as outlined below.  Will get CT head given she had a recent fall but grandson and nursing staff at that time reported no head impact.  AKI on CKD 3 A: likely secondary to decreased oral intake and UTI.  Will monitor as she has been volume repleted and getting IVF.  There is some signs of VUR which can lead to AKI as well so I will get bladder scans and if she is retaining then we can place a Foley catheter.  If AKI is not improving with volume resuscitation, VUR can lead to AKI independent of urinary retention and she may need urology consult.  Elevated LFTs: Exact etiology of this is unknown currently.  Will get hepatitis panel and right upper quadrant.  On chart review she is status postcholecystectomy.  Will trend for now.  Hypertension: Patient on amlodipine .  Will hold given patient is septic.  Will continue to monitor pressures currently.  Type 2 diabetes: She is on insulin  and metformin  and Jardiance .  Will hold those and started on SSI.  Hyperlipidemia/CAD: Continue home statin  GERD: Appears to be controlled via lifestyle as she is not on a PPI or H2 blocker.  Can we address this if her mentation improves.  Generalized weakness: Likely secondary to UTI.  Will get PT and OT consulted.  Recent fall: pt with 3 falls about one month 3-4 weeks ago. Family brought pt to ED but they left given long wait time. No obvious signs of injury but pt is encephalopathic so will get [facility] and spine imaging.   Interval update:  imaging showing subacute vertebral fracture. Discussed with NSG, Dr. Deatrice, who will see pt tomorrow. Recommends TLSO brace when out of bed for now.    VTE prophylaxis:  SQ Heparin   Diet: Heart healthy Code Status:  Full; confirmed with grandson. Telemetry:  Admission status: Inpatient, Progressive Patient is from: Home Anticipated d/c is to: Home Anticipated d/c is in: 2-3 days   Family Communication: Updated at bedside  Consults called: NSG   Severity of Illness: The appropriate patient status for this patient is INPATIENT. Inpatient status is judged to be reasonable and necessary in order to provide the required intensity of service to ensure the patient's safety. The patient's presenting symptoms, physical exam findings, and initial radiographic and laboratory data in the context of their chronic comorbidities is felt to place them at high risk for further clinical deterioration. Furthermore, it is not anticipated that the patient will be medically stable for discharge from the hospital within 2 midnights of admission.   * I certify that at the point of admission it is my clinical judgment that the patient will require inpatient hospital care spanning beyond 2 midnights from the point of admission due to high intensity of service, high risk for further deterioration and high frequency of surveillance required.DEWAINE Morene Bathe, MD [place] DEL. [facility]      [1] No Known Allergies

## 2024-04-28 NOTE — ED Notes (Signed)
 Placed patient on 2L [place] due to o2 sats being 91-92% on RA

## 2024-04-29 DIAGNOSIS — A4150 Gram-negative sepsis, unspecified: Secondary | ICD-10-CM | POA: Diagnosis present

## 2024-04-29 DIAGNOSIS — N390 Urinary tract infection, site not specified: Secondary | ICD-10-CM

## 2024-04-29 LAB — CBC
HCT: 32.8 % — ABNORMAL LOW (ref 36.0–46.0)
Hemoglobin: 10.9 g/dL — ABNORMAL LOW (ref 12.0–15.0)
MCH: 29.1 pg (ref 26.0–34.0)
MCHC: 33.2 g/dL (ref 30.0–36.0)
MCV: 87.7 fL (ref 80.0–100.0)
Platelets: 146 K/uL — ABNORMAL LOW (ref 150–400)
RBC: 3.74 MIL/uL — ABNORMAL LOW (ref 3.87–5.11)
RDW: 15.5 % (ref 11.5–15.5)
WBC: 19.1 K/uL — ABNORMAL HIGH (ref 4.0–10.5)
nRBC: 0 % (ref 0.0–0.2)

## 2024-04-29 LAB — GLUCOSE, CAPILLARY
Glucose-Capillary: 135 mg/dL — ABNORMAL HIGH (ref 70–99)
Glucose-Capillary: 130 mg/dL — ABNORMAL HIGH (ref 70–99)
Glucose-Capillary: 141 mg/dL — ABNORMAL HIGH (ref 70–99)
Glucose-Capillary: 93 mg/dL (ref 70–99)

## 2024-04-29 LAB — BASIC METABOLIC PANEL WITH GFR
Anion gap: 14 (ref 5–15)
BUN: 59 mg/dL — ABNORMAL HIGH (ref 8–23)
CO2: 17 mmol/L — ABNORMAL LOW (ref 22–32)
Calcium: 8.3 mg/dL — ABNORMAL LOW (ref 8.9–10.3)
Chloride: 108 mmol/L (ref 98–111)
Creatinine, Ser: 2.9 mg/dL — ABNORMAL HIGH (ref 0.44–1.00)
GFR, Estimated: 16 mL/min — ABNORMAL LOW (ref >60)
Glucose, Bld: 134 mg/dL — ABNORMAL HIGH (ref 70–99)
Potassium: 4.4 mmol/L (ref 3.5–5.1)
Sodium: 139 mmol/L (ref 135–145)

## 2024-04-29 LAB — HEPATITIS PANEL, ACUTE
HCV Ab: NONREACTIVE
Hep A IgM: NONREACTIVE
Hep B C IgM: NONREACTIVE
Hepatitis B Surface Ag: NONREACTIVE

## 2024-04-29 MED ORDER — AMLODIPINE BESYLATE 5 MG PO TABS
2.5000 mg | ORAL_TABLET | Freq: Every day | ORAL | Status: DC
  Administered 2024-04-29 – 2024-05-04 (×6): 2.5 mg via ORAL
  Filled 2024-04-29 (×6): qty 1

## 2024-04-29 MED ORDER — CLOPIDOGREL BISULFATE 75 MG PO TABS
75.0000 mg | ORAL_TABLET | Freq: Every day | ORAL | Status: DC
  Administered 2024-04-29 – 2024-05-04 (×6): 75 mg via ORAL
  Filled 2024-04-29 (×6): qty 1

## 2024-04-29 MED ORDER — ORAL CARE MOUTH RINSE: 15.0000 mL | OROMUCOSAL | Status: DC | PRN

## 2024-04-29 MED ORDER — ASPIRIN 81 MG PO TBEC
81.0000 mg | DELAYED_RELEASE_TABLET | Freq: Every day | ORAL | Status: DC
  Administered 2024-04-29 – 2024-05-04 (×6): 81 mg via ORAL
  Filled 2024-04-29 (×6): qty 1

## 2024-04-29 MED ORDER — HYDRALAZINE HCL 20 MG/ML IJ SOLN
5.0000 mg | Freq: Four times a day (QID) | INTRAMUSCULAR | Status: DC | PRN
  Administered 2024-05-03: 03:00:00 5 mg via INTRAVENOUS
  Filled 2024-04-29: qty 1

## 2024-04-29 MED ORDER — LACTATED RINGERS IV SOLN: INTRAVENOUS | Status: AC

## 2024-04-29 MED ORDER — NORTRIPTYLINE HCL 25 MG PO CAPS
150.0000 mg | ORAL_CAPSULE | Freq: Every day | ORAL | Status: DC
  Administered 2024-04-30 – 2024-05-03 (×5): 150 mg via ORAL
  Filled 2024-04-29 (×7): qty 6

## 2024-04-29 MED ADMIN — Ondansetron HCl Inj 4 MG/2ML (2 MG/ML): 4 mg | INTRAVENOUS | NDC 00409475518

## 2024-04-29 NOTE — Evaluation (Signed)
 Occupational Therapy Evaluation Patient Details Name: Cynthia Singh MRN: [id number] DOB: Apr 11, 1941 Today's Date: 04/29/2024   History of Present Illness   83 y.o. female admitted with sepsis & acute metabolic encephalopathy due to UTI. Also found to have subacute vertebral fracture L1, T5, T12, no neurosurgical intervention, TLSO brace when OOB, ok to take off with shower. PMH significant for diabetes, HTN, GERD, CKD, MI, obesity, CAD, OA     Clinical Impressions Pt admitted with above. Pt lethargic, ill-appearing, confused and a questionable historian. PLOF and home setup taken from 04/2023 admission. Pt states she uses RW and is mod independent with ADLs (chart review indicates she lives with grandsons, not brothers as indicated today). Pt A&Ox3 intermittently, answers some questions appropriately but varies with answering no repetitively while gazing upward at ceiling. Upon arrival to room, back brace delivered noted to be incorrect (TLSO specified in order set and lumbar brace set on counter) therefore OOB mobility assessment deferred 2/2 incorrect brace and pt's lethargy. Anticipate +2 for bed mobility and transfers for safety as pt requires TOTAL A for grooming activities bed level and MAX-TOTAL A for partial rolling to reposition. Pt would benefit from skilled OT services to address noted impairments and functional limitations (see below for any additional details) in order to maximize safety and independence while minimizing falls risk and caregiver burden. Anticipate the need for follow up OT services upon acute hospital DC. Patient will benefit from continued inpatient follow up therapy, <3 hours/day.      If plan is discharge home, recommend the following:   Two people to help with walking and/or transfers;Two people to help with bathing/dressing/bathroom     Functional Status Assessment   Patient has had a recent decline in their functional status and demonstrates the  ability to make significant improvements in function in a reasonable and predictable amount of time.     Equipment Recommendations   None recommended by OT (defer to next LOC)      Precautions/Restrictions   Precautions Precautions: Fall;Back Recall of Precautions/Restrictions: Impaired Required Braces or Orthoses: Spinal Brace Spinal Brace: Thoracolumbosacral orthotic;Other (comment) (worn when OOB, ok to remove when showering) Restrictions Weight Bearing Restrictions Per Provider Order: No     Mobility Bed Mobility Overal bed mobility: Needs Assistance Bed Mobility: Rolling Rolling: Max assist         General bed mobility comments: maxA to partially roll to R. Pt resistant and moaning. Pt refuses to be boosted up in bed or further repositioned for comfort    Transfers Overall transfer level: Needs assistance                 General transfer comment: Did not attempt - pt refusing, and TLSO brace not in room (wrong brace delivered).      Balance Overall balance assessment: History of Falls                                         ADL either performed or assessed with clinical judgement   ADL Overall ADL's : Needs assistance/impaired     Grooming: Wash/dry face;Bed level;Total assistance Grooming Details (indicate cue type and reason): TOTAL A for bed level grooming                               General ADL Comments: anticipate +2  for OOB, MAX - TOTAL for bed level ADLs at this time      Pertinent Vitals/Pain Pain Assessment Pain Assessment: Faces Faces Pain Scale: Hurts even more Pain Location: generalized with movement Pain Descriptors / Indicators: Grimacing, Guarding, Discomfort Pain Intervention(s): Limited activity within patient's tolerance, Repositioned     Extremity/Trunk Assessment Upper Extremity Assessment Upper Extremity Assessment: Generalized weakness   Lower Extremity Assessment Lower Extremity  Assessment: Generalized weakness   Cervical / Trunk Assessment Cervical / Trunk Assessment: Other exceptions (back fx, TLSO when OOB)   Communication Communication Communication: Impaired Factors Affecting Communication: Difficulty expressing self   Cognition Arousal: Lethargic Behavior During Therapy: Flat affect Cognition: No family/caregiver present to determine baseline, Cognition impaired   Orientation impairments: Time, Situation Awareness: Intellectual awareness impaired   Attention impairment (select first level of impairment): Focused attention Executive functioning impairment (select all impairments): Problem solving, Reasoning OT - Cognition Comments: pt confused, intermittently lethargic, question accuracy of home setup / PLOF. Pt will occassionaly answer questions appropriately, other times stares up at ceiling and states no on repeat.                 Following commands: Impaired Following commands impaired: Follows one step commands inconsistently     Cueing  General Comments   Cueing Techniques: Verbal cues;Gestural cues;Tactile cues  Pt generally ill-appearing. Wrong back brace delivered to room - order specifies TLSO, lumbar brace delivered. RN in room for vitals and made aware. Did not perform OOB mobility assessment due to lethargy and wrong brace in room - will further assess.           Home Living Family/patient expects to be discharged to:: Private residence Living Arrangements: Other relatives Available Help at Discharge: Family;Available PRN/intermittently Type of Home: House Home Access: Stairs to enter [employer] of Steps: 5 Entrance Stairs-Rails: Right Home Layout: One level     Bathroom Shower/Tub: Chief Strategy Officer: Handicapped height     Home Equipment: Tub bench;Grab bars - toilet;Grab bars - tub/shower;Cane - quad;Rollator (4 wheels);Rolling Walker (2 wheels)   Additional Comments: Pt poor  historian, info from last OT eval 2024. Pt states she lives with her brothers (chart review indicating she lives with grandchildren)      Prior Functioning/Environment Prior Level of Function : Patient poor historian/Family not available;Independent/Modified Independent             Mobility Comments: Pt poor historian. Does state she uses RW for mobility. ADLs Comments: pt states independent    OT Problem List: Decreased strength;Decreased range of motion;Decreased activity tolerance;Impaired balance (sitting and/or standing);Decreased cognition;Pain;Decreased knowledge of precautions;Decreased knowledge of use of DME or AE   OT Treatment/Interventions: Self-care/ADL training;Therapeutic exercise;Energy conservation;DME and/or AE instruction;Therapeutic activities;Cognitive remediation/compensation;Patient/family education;Balance training      OT Goals(Current goals can be found in the care plan section)   Acute Rehab OT Goals OT Goal Formulation: Patient unable to participate in goal setting Time For Goal Achievement: 05/13/24 Potential to Achieve Goals: Fair   OT Frequency:  Min 2X/week       AM-PAC OT 6 Clicks Daily Activity     Outcome Measure Help from another person eating meals?: Total (NPO) Help from another person taking care of personal grooming?: Total Help from another person toileting, which includes using toliet, bedpan, or urinal?: Total Help from another person bathing (including washing, rinsing, drying)?: Total Help from another person to put on and taking off regular upper body clothing?: Total Help from  another person to put on and taking off regular lower body clothing?: Total 6 Click Score: 6   End of Session Equipment Utilized During Treatment: Oxygen Nurse Communication: Mobility status (need for TLSO vs lumbar brace)  Activity Tolerance: Patient limited by lethargy Patient left: in bed;with call bell/phone within reach;with bed alarm set  OT  Visit Diagnosis: Unsteadiness on feet (R26.81);Other abnormalities of gait and mobility (R26.89);Muscle weakness (generalized) (M62.81);History of falling (Z91.81)                Time: 8784-8767 OT Time Calculation (min): 17 min Charges:  OT General Charges $OT Visit: 1 Visit OT Evaluation $OT Eval Low Complexity: 1 Low  Lavora Brisbon L. Hulbert Branscome, OTR/L  04/29/2024, 2:11 PM

## 2024-04-29 NOTE — Hospital Course (Addendum)
 Hospital course / significant events:   HPI: Cynthia Singh is a 83 y.o. year old female with medical history of hypertension, hyperlipidemia, type 2 diabetes, CKD 3A presenting to the ED with nausea, vomiting, diarrhea, suprapubic abd pain, chills without fever, and generalized weakness. Disoriented and hx limited. Family reports no underlying cognitive problems.   12/12: to ED. Admitted for UTI. Chest x-ray with atelectasis and CT abdomen pelvis shows mild left renal pyelectasis and ureterectasis with gas in the renal collecting system with concern for VUR.  12/13: (+)Ecoli cultures urine and blood, continue abx. Neurosurgery has seen pt, no intervention planned. Alert but remains confused.  12/14: (+)candidemia resulted overnight, added micofungin. Mental status is improved today  12/15: ID consult - suspect fungemia likely contaminant  12/16: Echo pending. Pt doing well. Hyponatremia, working this up, restart NS fluids.      Consultants:  Neurosurgery Infectious disease   Procedures/Surgeries: noen      ASSESSMENT & PLAN:   Sepsis d/t E.Coli UTI Severe sepsis given encephalopathy, AKI Bacteremia d/t UTI BCx L wrist collected 12/12 @13 :46 --> (+)yeast in aerobic bottle, candida albicans, candida glabrata BCx L arm collected 12/12 @08 :38 --> (+)Ecoli in aerobic and anaerobic  UCx collected 12/12 @11 :03 --> (+)Ecoli sensitive to ceftriaxone  Hematuria d/t UTI Lactic acidosis - resolved  Leukocytosis - improving Ceftriaxone , Micofungin  Monitor sepsis parameters and mental status --> improved  Repeat BCx per ID  Echocardiogram pending   Concern for urinary retention / vesicoureteral reflux Leaking around Foley Foley has been d/c  OK for [place] if needed Bladder scan q8h / prn   Fungemia - most likely contaminant as not typical patient for this problem, no indwelling lines / pacemakers  abx per ID   AKI d/t UTI/dehydration - improvement question also d/t  vesicoureteral reflux / retention AKI on CKD3a Cr 3.27 on admission --> 1.7 today  Monitor BMP Tx underlying cause(s) Foley continue   Hyponatremia  Question d/t renal injury but Cr improving now  Will add on serum osm, urine osm and na Resume NS fluids   Subacute vertebral fracture L1, T12 Per neurosurgery --> no intervention acutely  TLSO brace when out of bed   PT/OT as able Outpatient neurosurgery follow up   Elevated LFTs, mild  Exact etiology of this is unknown  No concerns RUQ US  Trend    Hypertension Hold home meds given sepsis / risk hypotension - BP up a bit, restarting amlodipine    Type 2 diabetes At home she is on insulin  and metformin  and Jardiance  SSI while here   Hyperlipidemia/CAD:  Holding statin w/ borderline LFT ASA, Plavix     Recent fall PT/OT  Acute metabolic encephalopathy d/t UTI / uremia - resolved  Monitor   overweight based on BMI: Body mass index is 25.95 kg/m.[name] Significantly low or high BMI is associated with higher medical risk.  Underweight - under 18  overweight - 25 to 29 obese - 30 or more Class 1 obesity: BMI of 30.0 to 34 Class 2 obesity: BMI of 35.0 to 39 Class 3 obesity: BMI of 40.0 to 49 Super Morbid Obesity: BMI 50-59 Super-super Morbid Obesity: BMI 60+ Healthy nutrition and physical activity advised as adjunct to other disease management and risk reduction treatments    DVT prophylaxis: heparin  IV fluids: LR continuous IV fluids dc now that taking po, monitor BMP Nutrition: dysphagia diet Central lines / other devices: none  Code Status: FULL CODE ACP documentation reviewed:  none on file in [facility]  TOC needs: TBD Medical barriers to dispo: IV abx, bacteremia. Expected medical readiness for discharge few days.

## 2024-04-29 NOTE — ED Notes (Signed)
Bladder scan 456 ml.

## 2024-04-29 NOTE — Progress Notes (Signed)
 PROGRESS NOTE    Cynthia Singh   FMW:[id number] DOB: 07/29/1940  DOA: 04/28/2024 Date of Service: 04/29/2024 which is hospital day 1  PCP: Wendee Lynwood HERO, NP    Hospital course / significant events:   HPI: Cynthia Singh is a 83 y.o. year old female with medical history of hypertension, hyperlipidemia, type 2 diabetes, CKD 3A presenting to the ED with nausea, vomiting, diarrhea, suprapubic abd pain, chills without fever, and generalized weakness. Disoriented and hx limited. Family reports no underlying cognitive problems.   12/12: to ED. Admitted for UTI. Chest x-ray with atelectasis and CT abdomen pelvis shows mild left renal pyelectasis and ureterectasis with gas in the renal collecting system with concern for VUR.  12/13: (+)Ecoli cultures urine and blood, continue abx. Neurosurgery has seen pt, no intervention planned. Alert but remains confused.      Consultants:  neurosurgery  Procedures/Surgeries: noen      ASSESSMENT & PLAN:   E.Coli UTI Sepsis and bacteremia d/t UTI  Hematuria d/t UTI Lactic acidosis - resolved  Leukocytosis - improved  Ceftriaxone   Urine culture (+)Ecoli pending susceptibilities Blood culture (+)enterobacterales / Ecoli pending susceptibilities Monitor sepsis parameters and mental status   Acute metabolic encephalopathy d/t UTI / uremia  Delirium precautions  Swallow screen passes w/ some coughing, will trial dysphagia diet   AKI d/t UTI/dehydration question also d/t vesicoureteral reflux / retention AKI on CKD3a Cr 3.27 on admission --> 2.9 HD2 04/29/2024  IV fluids Monitor BMP Tx underlying cause(s) Bladder scan, foley as needed if retention   Subacute vertebral fracture L1, T12 Per neurosurgery --> no intervention acutely  TLSO brace when out of bed   PT/OT as able Outpatient neurosurgery follow up   Elevated LFTs, mild  Exact etiology of this is unknown  No concerns RUQ US  hepatitis panel  Trend     Hypertension Hold home meds given sepsis / risk hypotension   Type 2 diabetes At home she is on insulin  and metformin  and Jardiance  SSI while here   Hyperlipidemia/CAD:  Holding statin w/ borderline LFT ASA, Plavix     GERD: Appears to be controlled via lifestyle as she is not on a PPI or H2 blocker PPI as needed   Recent fall PT/OT Neurosurgery as above     overweight based on BMI: Body mass index is 25.95 kg/m.[name] Significantly low or high BMI is associated with higher medical risk.  Underweight - under 18  overweight - 25 to 29 obese - 30 or more Class 1 obesity: BMI of 30.0 to 34 Class 2 obesity: BMI of 35.0 to 39 Class 3 obesity: BMI of 40.0 to 49 Super Morbid Obesity: BMI 50-59 Super-super Morbid Obesity: BMI 60+ Healthy nutrition and physical activity advised as adjunct to other disease management and risk reduction treatments    DVT prophylaxis: heparin  IV fluids: LR continuous IV fluids  Nutrition: dysphagia diet Central lines / other devices: Foley cath  Code Status: FULL CODE ACP documentation reviewed:  none on file in [facility]  TOC needs: TBD Medical barriers to dispo: IV abx, bacteremia. Expected medical readiness for discharge few days.              Subjective / Brief ROS:  Patient denies pain She is verbal and answers questions but cannot explain where she is or what happened.    Family Communication: spoke on phone w/ grandson 04/29/2024 3:32 PM all questions answered     Objective Findings:  Vitals:   04/29/24 0604 04/29/24  9171 04/29/24 1152 04/29/24 1224  BP: (!) 165/64 (!) 176/76 (!) 166/149 (!) 162/78  Pulse: (!) 109 (!) 108 100   Resp: 20 20 20    Temp: 100.1 F (37.8 C) 99 F (37.2 C) 98.2 F (36.8 C)   TempSrc:      SpO2: 94% 95% 95%   Weight:      Height:        Intake/Output Summary (Last 24 hours) at 04/29/2024 1531 Last data filed at 04/29/2024 1300 Gross per 24 hour  Intake 1616.41 ml  Output 2050 ml  Net  -433.59 ml   Filed Weights   04/28/24 0832 04/29/24 0500  Weight: 76.3 kg 77.4 kg    Examination:  Physical Exam Constitutional:      General: She is not in acute distress.    Appearance: She is ill-appearing.  Cardiovascular:     Rate and Rhythm: Normal rate and regular rhythm.  Pulmonary:     Effort: Pulmonary effort is normal.     Breath sounds: Normal breath sounds.  Abdominal:     Palpations: Abdomen is soft.  Neurological:     Mental Status: She is alert. She is disoriented.     Comments: Cannot tell me where she is, year is 1940-something   Psychiatric:        Behavior: Behavior normal.     Comments: No agitation          Scheduled Medications:   amLODipine   2.5 mg Oral Daily   aspirin  EC  81 mg Oral Daily   clopidogrel   75 mg Oral Daily   heparin   5,000 Units Subcutaneous Q8H   insulin  aspart  0-6 Units Subcutaneous TID WC   nortriptyline   150 mg Oral QHS   sodium chloride  flush  3 mL Intravenous Q12H    Continuous Infusions:  cefTRIAXone  (ROCEPHIN )  IV 2 g (04/29/24 1518)   lactated ringers       PRN Medications:  acetaminophen  **OR** acetaminophen , HYDROmorphone  (DILAUDID ) injection, ondansetron  **OR** ondansetron  (ZOFRAN ) IV, oxyCODONE , senna-docusate  Antimicrobials from admission:  Anti-infectives (From admission, onward)    Start     Dose/Rate Route Frequency Ordered Stop   04/28/24 1600  cefTRIAXone  (ROCEPHIN ) 2 g in sodium chloride  0.9 % 100 mL IVPB        2 g 200 mL/hr over 30 Minutes Intravenous Every 24 hours 04/28/24 1315 05/05/24 1559   04/28/24 0930  ceFEPIme  (MAXIPIME ) 2 g in sodium chloride  0.9 % 100 mL IVPB        2 g 200 mL/hr over 30 Minutes Intravenous  Once 04/28/24 0923 04/28/24 1022   04/28/24 0930  metroNIDAZOLE  (FLAGYL ) IVPB 500 mg        500 mg 100 mL/hr over 60 Minutes Intravenous  Once 04/28/24 9076 04/28/24 1136           Data Reviewed:  I have personally reviewed the following...  CBC: Recent Labs  Lab  04/28/24 0838 04/29/24 0345  WBC 30.3* 19.1*  NEUTROABS 25.4*  --   HGB 11.4* 10.9*  HCT 35.6* 32.8*  MCV 89.2 87.7  PLT 181 146*   Basic Metabolic Panel: Recent Labs  Lab 04/28/24 0838 04/29/24 0345  NA 134* 139  K 4.7 4.4  CL 99 108  CO2 15* 17*  GLUCOSE 179* 134*  BUN 55* 59*  CREATININE 3.27* 2.90*  CALCIUM  7.7* 8.3*  MG 1.5*  --    GFR: Estimated Creatinine Clearance: 16.9 mL/min (A) (by C-G formula based on SCr  of 2.9 mg/dL (H)). Liver Function Tests: Recent Labs  Lab 04/28/24 0838  AST 45*  ALT 71*  ALKPHOS 165*  BILITOT 0.7  PROT 6.3*  ALBUMIN 3.0*   Recent Labs  Lab 04/28/24 0838  LIPASE <10*   No results for input(s): AMMONIA in the last 168 hours. Coagulation Profile: Recent Labs  Lab 04/28/24 1346  INR 1.4*   Cardiac Enzymes: No results for input(s): CKTOTAL, CKMB, CKMBINDEX, TROPONINI in the last 168 hours. BNP (last 3 results) No results for input(s): PROBNP in the last 8760 hours. HbA1C: No results for input(s): HGBA1C in the last 72 hours. CBG: Recent Labs  Lab 04/28/24 1627 04/29/24 0822 04/29/24 1145  GLUCAP 146* 135* 130*   Lipid Profile: No results for input(s): CHOL, HDL, LDLCALC, TRIG, CHOLHDL, LDLDIRECT in the last 72 hours. Thyroid Function Tests: No results for input(s): TSH, T4TOTAL, FREET4, T3FREE, THYROIDAB in the last 72 hours. Anemia Panel: No results for input(s): VITAMINB12, FOLATE, FERRITIN, TIBC, IRON , RETICCTPCT in the last 72 hours. Most Recent Urinalysis On File:     Component Value Date/Time   COLORURINE AMBER (A) 04/28/2024 1103   APPEARANCEUR TURBID (A) 04/28/2024 1103   LABSPEC 1.013 04/28/2024 1103   PHURINE 6.0 04/28/2024 1103   GLUCOSEU >=500 (A) 04/28/2024 1103   HGBUR LARGE (A) 04/28/2024 1103   BILIRUBINUR NEGATIVE 04/28/2024 1103   BILIRUBINUR neg 06/14/2019 1408   KETONESUR NEGATIVE 04/28/2024 1103   PROTEINUR 100 (A) 04/28/2024 1103    UROBILINOGEN 0.2 06/14/2019 1408   UROBILINOGEN 1.0 10/19/2012 1030   NITRITE POSITIVE (A) 04/28/2024 1103   LEUKOCYTESUR MODERATE (A) 04/28/2024 1103   Sepsis Labs: @LABRCNTIP (procalcitonin:4,lacticidven:4) Microbiology: Recent Results (from the past 240 hours)  Resp panel by RT-PCR (RSV, Flu A&B, Covid) Anterior Nasal Swab     Status: None   Collection Time: 04/28/24  8:30 AM   Specimen: Anterior Nasal Swab  Result Value Ref Range Status   SARS Coronavirus 2 by RT PCR NEGATIVE NEGATIVE Final    Comment: (NOTE) SARS-CoV-2 target nucleic acids are NOT DETECTED.  The SARS-CoV-2 RNA is generally detectable in upper respiratory specimens during the acute phase of infection. The lowest concentration of SARS-CoV-2 viral copies this assay can detect is 138 copies/mL. A negative result does not preclude SARS-Cov-2 infection and should not be used as the sole basis for treatment or other patient management decisions. A negative result may occur with  improper specimen collection/handling, submission of specimen other than nasopharyngeal swab, presence of viral mutation(s) within the areas targeted by this assay, and inadequate number of viral copies(<138 copies/mL). A negative result must be combined with clinical observations, patient history, and epidemiological information. The expected result is Negative.  Fact Sheet for Patients:  bloggercourse.com  Fact Sheet for Healthcare Providers:  seriousbroker.it  This test is no t yet approved or cleared by the [place]  FDA and  has been authorized for detection and/or diagnosis of SARS-CoV-2 by FDA under an Emergency Use Authorization (EUA). This EUA will remain  in effect (meaning this test can be used) for the duration of the COVID-19 declaration under Section 564(b)(1) of the Act, 21 U.S.C.section 360bbb-3(b)(1), unless the authorization is terminated  or revoked sooner.        Influenza A by PCR NEGATIVE NEGATIVE Final   Influenza B by PCR NEGATIVE NEGATIVE Final    Comment: (NOTE) The Xpert Xpress SARS-CoV-2/FLU/RSV plus assay is intended as an aid in the diagnosis of influenza from Nasopharyngeal swab specimens and should  not be used as a sole basis for treatment. Nasal washings and aspirates are unacceptable for Xpert Xpress SARS-CoV-2/FLU/RSV testing.  Fact Sheet for Patients: bloggercourse.com  Fact Sheet for Healthcare Providers: seriousbroker.it  This test is not yet approved or cleared by the [place]  FDA and has been authorized for detection and/or diagnosis of SARS-CoV-2 by FDA under an Emergency Use Authorization (EUA). This EUA will remain in effect (meaning this test can be used) for the duration of the COVID-19 declaration under Section 564(b)(1) of the Act, 21 U.S.C. section 360bbb-3(b)(1), unless the authorization is terminated or revoked.     Resp Syncytial Virus by PCR NEGATIVE NEGATIVE Final    Comment: (NOTE) Fact Sheet for Patients: bloggercourse.com  Fact Sheet for Healthcare Providers: seriousbroker.it  This test is not yet approved or cleared by the [place]  FDA and has been authorized for detection and/or diagnosis of SARS-CoV-2 by FDA under an Emergency Use Authorization (EUA). This EUA will remain in effect (meaning this test can be used) for the duration of the COVID-19 declaration under Section 564(b)(1) of the Act, 21 U.S.C. section 360bbb-3(b)(1), unless the authorization is terminated or revoked.  Performed at [facility], [address] [place], [place] [address]   Blood Culture (routine x 2)     Status: None (Preliminary result)   Collection Time: 04/28/24  8:38 AM   Specimen: BLOOD  Result Value Ref Range Status   Specimen Description BLOOD BLOOD LEFT ARM  Final   Special Requests    Final    BOTTLES DRAWN AEROBIC AND ANAEROBIC Blood Culture adequate volume   Culture  Setup Time   Final    Organism ID to follow IN BOTH AEROBIC AND ANAEROBIC BOTTLES GRAM NEGATIVE RODS CRITICAL RESULT CALLED TO, READ BACK BY AND VERIFIED WITH: NATHAN BELUE @ 04/28/2024 2243 AB Performed at [facility], [address] [place], [place] [address]    Culture GRAM NEGATIVE RODS  Final   Report Status PENDING  Incomplete  Blood Culture ID Panel (Reflexed)     Status: Abnormal   Collection Time: 04/28/24  8:38 AM  Result Value Ref Range Status   Enterococcus faecalis NOT DETECTED NOT DETECTED Final   Enterococcus Faecium NOT DETECTED NOT DETECTED Final   Listeria monocytogenes NOT DETECTED NOT DETECTED Final   Staphylococcus species NOT DETECTED NOT DETECTED Final   Staphylococcus aureus (BCID) NOT DETECTED NOT DETECTED Final   Staphylococcus epidermidis NOT DETECTED NOT DETECTED Final   Staphylococcus lugdunensis NOT DETECTED NOT DETECTED Final   Streptococcus species NOT DETECTED NOT DETECTED Final   Streptococcus agalactiae NOT DETECTED NOT DETECTED Final   Streptococcus pneumoniae NOT DETECTED NOT DETECTED Final   Streptococcus pyogenes NOT DETECTED NOT DETECTED Final   A.calcoaceticus-baumannii NOT DETECTED NOT DETECTED Final   Bacteroides fragilis NOT DETECTED NOT DETECTED Final   Enterobacterales DETECTED (A) NOT DETECTED Final    Comment: Enterobacterales represent a large order of gram negative bacteria, not a single organism. CRITICAL RESULT CALLED TO, READ BACK BY AND VERIFIED WITH: NATHAN BELUE @ 04/28/2024 2243 AB    Enterobacter cloacae complex NOT DETECTED NOT DETECTED Final   Escherichia coli DETECTED (A) NOT DETECTED Final    Comment: CRITICAL RESULT CALLED TO, READ BACK BY AND VERIFIED WITH: NATHAN BELUE @ 04/28/2024 2243 AB    Klebsiella aerogenes NOT DETECTED NOT DETECTED Final   Klebsiella oxytoca NOT DETECTED NOT DETECTED Final   Klebsiella  pneumoniae NOT DETECTED NOT DETECTED Final   Proteus species  NOT DETECTED NOT DETECTED Final   Salmonella species NOT DETECTED NOT DETECTED Final   Serratia marcescens NOT DETECTED NOT DETECTED Final   Haemophilus influenzae NOT DETECTED NOT DETECTED Final   Neisseria meningitidis NOT DETECTED NOT DETECTED Final   Pseudomonas aeruginosa NOT DETECTED NOT DETECTED Final   Stenotrophomonas maltophilia NOT DETECTED NOT DETECTED Final   Candida albicans NOT DETECTED NOT DETECTED Final   Candida auris NOT DETECTED NOT DETECTED Final   Candida glabrata NOT DETECTED NOT DETECTED Final   Candida krusei NOT DETECTED NOT DETECTED Final   Candida parapsilosis NOT DETECTED NOT DETECTED Final   Candida tropicalis NOT DETECTED NOT DETECTED Final   Cryptococcus neoformans/gattii NOT DETECTED NOT DETECTED Final   CTX-M ESBL NOT DETECTED NOT DETECTED Final   Carbapenem resistance IMP NOT DETECTED NOT DETECTED Final   Carbapenem resistance KPC NOT DETECTED NOT DETECTED Final   Carbapenem resistance NDM NOT DETECTED NOT DETECTED Final   Carbapenem resist OXA 48 LIKE NOT DETECTED NOT DETECTED Final   Carbapenem resistance VIM NOT DETECTED NOT DETECTED Final    Comment: Performed at [facility], [address] [place], [place] [address]  Urine Culture (for pregnant, neutropenic or urologic patients or patients with an indwelling urinary catheter)     Status: Abnormal (Preliminary result)   Collection Time: 04/28/24 11:03 AM   Specimen: Urine, Clean Catch  Result Value Ref Range Status   Specimen Description   Final    URINE, CLEAN CATCH Performed at [facility], [address] [place], [place] [address]    Special Requests   Final    NONE Performed at [facility], [address], [place], [place] [address]    Culture (A)  Final    50,000 COLONIES/mL ESCHERICHIA COLI SUSCEPTIBILITIES TO FOLLOW Performed at [facility], [address], [place], [place]  [address]    Report Status PENDING  Incomplete  Blood Culture (routine x 2)     Status: None (Preliminary result)   Collection Time: 04/28/24  1:46 PM   Specimen: BLOOD  Result Value Ref Range Status   Specimen Description BLOOD BLOOD LEFT WRIST  Final   Special Requests   Final    BOTTLES DRAWN AEROBIC AND ANAEROBIC Blood Culture adequate volume   Culture   Final    NO GROWTH < 24 HOURS Performed at [facility], [address] [place], [place] [address]    Report Status PENDING  Incomplete      Radiology Studies last 3 days: US  Abdomen Limited RUQ (LIVER/GB) Result Date: 04/28/2024 EXAM: Right Upper Quadrant Abdominal Ultrasound 04/28/2024 02:34:52 PM TECHNIQUE: Real-time ultrasonography of the right upper quadrant of the abdomen was performed. COMPARISON: CT abdomen and pelvis 04/28/2024. CLINICAL HISTORY: Elevated LFTs. FINDINGS: LIVER: The liver demonstrates normal echogenicity. No intrahepatic biliary ductal dilatation. No evidence of mass. BILIARY SYSTEM: No pericholecystic fluid or wall thickening. No cholelithiasis. Common bile duct is within normal limits measuring 8 mm. OTHER: No right upper quadrant ascites. IMPRESSION: 1. No acute findings. 2. Common bile duct measures 8 mm which is within normal limits . Electronically signed by: Greig Pique MD 04/28/2024 10:02 PM EST RP Workstation: [id number]   CT LUMBAR SPINE WO CONTRAST Result Date: 04/28/2024 EXAM: CT THORACIC AND LUMBAR SPINE WITHOUT INTRAVENOUS CONTRAST 04/28/2024 05:08:00 PM TECHNIQUE: CT of the thoracic and lumbar spine was performed without the administration of intravenous contrast. Multiplanar reformatted images are provided for review. Automated exposure control, iterative reconstruction, and/or weight  based adjustment of the mA/kV was utilized to reduce the radiation dose to as low as reasonably achievable. Incidental adrenal and/or renal findings do not require follow up imaging. COMPARISON: Thoracic and  lumbar spine radiographs 04/28/2024. CT abdomen and pelvis 04/28/2024. CTA chest 09/04/2019. CLINICAL HISTORY: fall FINDINGS: THORACIC SPINE: BONES AND ALIGNMENT: Exaggerated upper thoracic kyphosis. Mild upper thoracic levoscoliosis and mid to lower thoracic dextroscoliosis. No significant listhesis. Oblique nondisplaced fracture through the anterior aspect of the T5 vertebral body and superior endplate with involvement of anterior and right lateral osteophytes, new from 2021 but of indeterminate age as some margins appear more sclerotic/chronic while others are less well defined. No definite associated middle or posterior column fracture. Chronic mild T12 superior endplate compression fracture, unchanged from 2021. Ankylosis by bridging vertebral osteophytes throughout the thoracic spine. Multilevel facet ankylosis as well. DEGENERATIVE CHANGES: No high-grade spinal canal stenosis. SOFT TISSUES: No acute abnormality. Aortic and coronary atherosclerosis. LUMBAR SPINE: BONES AND ALIGNMENT: 5 lumbar type vertebrae. Trace anterolisthesis of L4 and L5. Horizontal fracture through the superior half of the L1 vertebral body with 7 mm of distraction and with a nondisplaced fracture extending through the T12 posterior elements bilaterally. Some of the fracture margins are mildly sclerotic, and this may be subacute. Widespread posterior element ankylosis in the lumbar spine. DEGENERATIVE CHANGES: At least mild spinal stenosis and mild to moderate left neural foraminal stenosis at L4-L5 due to disc bulging, endplate spurring, and severe facet hypertrophy. SOFT TISSUES: Likely mild paravertebral soft tissue swelling at L1. IMPRESSION: 1. Horizontal fracture through the superior half of the L1 vertebral body and T12 posterior elements, possibly subacute. 2. Oblique nondisplaced fracture through the anterior aspect of the T5 vertebral body, new from 2021 but of indeterminate age. 3. Widespread thoracic and lumbar ankylosis 4.  Chronic T12 compression fracture. Electronically signed by: Dasie Hamburg MD 04/28/2024 06:37 PM EST RP Workstation: [id number]   CT THORACIC SPINE WO CONTRAST Result Date: 04/28/2024 EXAM: CT THORACIC AND LUMBAR SPINE WITHOUT INTRAVENOUS CONTRAST 04/28/2024 05:08:00 PM TECHNIQUE: CT of the thoracic and lumbar spine was performed without the administration of intravenous contrast. Multiplanar reformatted images are provided for review. Automated exposure control, iterative reconstruction, and/or weight based adjustment of the mA/kV was utilized to reduce the radiation dose to as low as reasonably achievable. Incidental adrenal and/or renal findings do not require follow up imaging. COMPARISON: Thoracic and lumbar spine radiographs 04/28/2024. CT abdomen and pelvis 04/28/2024. CTA chest 09/04/2019. CLINICAL HISTORY: fall FINDINGS: THORACIC SPINE: BONES AND ALIGNMENT: Exaggerated upper thoracic kyphosis. Mild upper thoracic levoscoliosis and mid to lower thoracic dextroscoliosis. No significant listhesis. Oblique nondisplaced fracture through the anterior aspect of the T5 vertebral body and superior endplate with involvement of anterior and right lateral osteophytes, new from 2021 but of indeterminate age as some margins appear more sclerotic/chronic while others are less well defined. No definite associated middle or posterior column fracture. Chronic mild T12 superior endplate compression fracture, unchanged from 2021. Ankylosis by bridging vertebral osteophytes throughout the thoracic spine. Multilevel facet ankylosis as well. DEGENERATIVE CHANGES: No high-grade spinal canal stenosis. SOFT TISSUES: No acute abnormality. Aortic and coronary atherosclerosis. LUMBAR SPINE: BONES AND ALIGNMENT: 5 lumbar type vertebrae. Trace anterolisthesis of L4 and L5. Horizontal fracture through the superior half of the L1 vertebral body with 7 mm of distraction and with a nondisplaced fracture extending through the T12 posterior  elements bilaterally. Some of the fracture margins are mildly sclerotic, and this may be subacute. Widespread posterior element ankylosis in  the lumbar spine. DEGENERATIVE CHANGES: At least mild spinal stenosis and mild to moderate left neural foraminal stenosis at L4-L5 due to disc bulging, endplate spurring, and severe facet hypertrophy. SOFT TISSUES: Likely mild paravertebral soft tissue swelling at L1. IMPRESSION: 1. Horizontal fracture through the superior half of the L1 vertebral body and T12 posterior elements, possibly subacute. 2. Oblique nondisplaced fracture through the anterior aspect of the T5 vertebral body, new from 2021 but of indeterminate age. 3. Widespread thoracic and lumbar ankylosis 4. Chronic T12 compression fracture. Electronically signed by: Dasie Hamburg MD 04/28/2024 06:37 PM EST RP Workstation: [id number]   CT CERVICAL SPINE WO CONTRAST Result Date: 04/28/2024 EXAM: CT CERVICAL SPINE WITHOUT CONTRAST 04/28/2024 05:08:00 PM TECHNIQUE: CT of the cervical spine was performed without the administration of intravenous contrast. Multiplanar reformatted images are provided for review. Automated exposure control, iterative reconstruction, and/or weight based adjustment of the mA/kV was utilized to reduce the radiation dose to as low as reasonably achievable. COMPARISON: CT cervical spine 04/30/2023 CLINICAL HISTORY: Fall FINDINGS: CERVICAL SPINE: BONES AND ALIGNMENT: Chronic straightening of the normal cervical lordosis. Slight left convex cervical curvature near the cervicothoracic junction. No significant listhesis. No acute fracture or suspicious lesion. DEGENERATIVE CHANGES: Spondylosis with large anterior vertebral osteophytes throughout the majority of the cervical spine. Advanced multilevel facet arthrosis. Potentially moderate multilevel spinal stenosis. Moderate to severe bilateral neural foraminal stenosis at C3-C4. SOFT TISSUES: No prevertebral soft tissue swelling. IMPRESSION: 1. No  acute cervical spine fracture or traumatic malalignment. 2. Diffuse cervical spondylosis and facet arthrosis. Electronically signed by: Dasie Hamburg MD 04/28/2024 06:13 PM EST RP Workstation: [id number]   DG Thoracic Spine 2 View Result Date: 04/28/2024 CLINICAL DATA:  Multiple fall EXAM: THORACIC SPINE 2 VIEWS COMPARISON:  CT 04/28/2024, chest CT 09/04/2019 FINDINGS: Scoliosis of the thoracic spine. Moderate superior endplate deformity at T12, probably chronic to the chest CT from 2021 but lucencies on today's CT in the region of the spinous process. Lucency at the superior aspect of L1 vertebral body. Remaining vertebra demonstrate grossly normal stature. Multilevel flowing osteophytosis. IMPRESSION: 1. Moderate superior endplate deformity at T12, probably chronic to the chest CT from 2021 2. Suspected acute fracture involving L1 and posterior elements at T12. Dedicated thoracolumbar CT recommended for further assessment Electronically Signed   By: Luke Bun M.D.   On: 04/28/2024 16:14   DG Lumbar Spine 2-3 Views Result Date: 04/28/2024 CLINICAL DATA:  Multiple fall EXAM: LUMBAR SPINE - 2-3 VIEW COMPARISON:  CT 04/28/2024 FINDINGS: Lumbar alignment within normal limits. Aortic atherosclerosis. Abnormal lucency at the L1 vertebral body with superior endplate deformity. There is also additional superior endplate deformity at T12. On sagittal reconstructions of today CT, possible linear lucency at the spinous process of T12 and left posterior arch of T12. Moderate disc space narrowing L3-L4 and L5-S1. Prominent lower lumbar facet degenerative changes. IMPRESSION: 1. Abnormal lucency at the L1 vertebral body with superior endplate deformity, suspicious for acute fracture. Additional superior endplate deformity at T12, age indeterminate. Possible linear lucency at the spinous process of T12 and left posterior arch of T12, suspicious for acute fracture. Dedicated thoracolumbar CT recommended for further  assessment. 2. Moderate degenerative changes of the lower lumbar spine. Electronically Signed   By: Luke Bun M.D.   On: 04/28/2024 16:12   DG Cervical Spine 2 or 3 views Result Date: 04/28/2024 CLINICAL DATA:  Multiple fall EXAM: CERVICAL SPINE - 2-3 VIEW COMPARISON:  04/12/2011, CT 01/28/2023 FINDINGS: Straightening of the cervical spine. Normal  prevertebral soft tissue thickness. Dens and lateral masses are grossly within normal limits allowing for suboptimal positioning. Moderate diffuse disc space narrowing C3 through C7 with bulky multilevel osteophytes. IMPRESSION: Straightening of the cervical spine with moderate diffuse degenerative changes. Cross-sectional imaging follow-up if persistent concern for cervical fracture Electronically Signed   By: Luke Bun M.D.   On: 04/28/2024 16:05   DG HIPS BILAT WITH PELVIS MIN 5 VIEWS Result Date: 04/28/2024 CLINICAL DATA:  Recent fall concern for fracture EXAM: DG HIP (WITH OR WITHOUT PELVIS) 5+V BILAT COMPARISON:  CT 04/28/2024 FINDINGS: SI joints are non widened. Pubic symphysis appears intact. No definitive fracture or malalignment. Prominent acetabular osteophytes. Mild bilateral hip degenerative change IMPRESSION: No acute osseous abnormality. Cross-sectional imaging follow-up if persistent concern for hip or pelvic fracture Electronically Signed   By: Luke Bun M.D.   On: 04/28/2024 16:03   CT HEAD WO CONTRAST ( ) Result Date: 04/28/2024 EXAM: CT HEAD WITHOUT 04/28/2024 03:06:26 PM TECHNIQUE: CT of the head was performed without the administration of intravenous contrast. Automated exposure control, iterative reconstruction, and/or weight based adjustment of the mA/kV was utilized to reduce the radiation dose to as low as reasonably achievable. COMPARISON: head CT 04/30/2023 CLINICAL HISTORY: Mental status change, unknown cause FINDINGS: BRAIN AND VENTRICLES: There is no evidence of an acute infarct, intracranial hemorrhage, mass,  midline shift, hydrocephalus, or extra-axial fluid collection. Cerebral volume is normal for age. Cerebral white matter hypodensities are nonspecific but compatible with mild chronic small vessel ischemic disease. Calcified atherosclerosis at the skull base. ORBITS: Bilateral cataract extraction. SINUSES AND MASTOIDS: No acute abnormality. SOFT TISSUES AND SKULL: No acute skull fracture. No acute soft tissue abnormality. IMPRESSION: 1. No acute intracranial abnormality. 2. Mild chronic small vessel ischemic disease. Electronically signed by: Dasie Hamburg MD 04/28/2024 03:12 PM EST RP Workstation: [id number]   CT ABDOMEN PELVIS WO CONTRAST Result Date: 04/28/2024 CLINICAL DATA:  Diffuse abdominal pain.  Sepsis. EXAM: CT ABDOMEN AND PELVIS WITHOUT CONTRAST TECHNIQUE: Multidetector CT imaging of the abdomen and pelvis was performed following the standard protocol without IV contrast. RADIATION DOSE REDUCTION: This exam was performed according to the departmental dose-optimization program which includes automated exposure control, adjustment of the mA and/or kV according to patient size and/or use of iterative reconstruction technique. COMPARISON:  10/10/2010 FINDINGS: Lower chest: No acute findings. Hepatobiliary: No mass visualized on this unenhanced exam. Prior cholecystectomy. No evidence of biliary obstruction. Pancreas: No mass or inflammatory process visualized on this unenhanced exam. Spleen:  Within normal limits in size. Adrenals/Urinary tract: No evidence of urolithiasis. Mild left renal pyelectasis and ureterectasis is seen with gas in the renal collecting systems, most likely due to vesicoureteral reflux. Small amount of gas also seen in urinary bladder, likely from recent instrumentation. Stomach/Bowel: No evidence of small bowel obstruction, inflammatory process, or abnormal fluid collections. Diffuse gaseous distention of colon is seen with moderate stool burden is, which may be due to ileus or  constipation. Vascular/Lymphatic: No pathologically enlarged lymph nodes identified. No evidence of abdominal aortic aneurysm. Reproductive: Prior hysterectomy noted. Adnexal regions are unremarkable in appearance. Other:  None. Musculoskeletal:  No suspicious bone lesions identified. IMPRESSION: Mild left renal pyelectasis and ureterectasis with gas in the renal collecting system, most likely due to vesicoureteral reflux. No evidence of ureteral calculus. Gas in urinary bladder. Recommend clinical correlation for recent instrumentation. Diffuse gaseous distention of colon with moderate stool burden, which may be due to ileus or constipation. Electronically Signed   By: Norleen LABOR  Graham M.D.   On: 04/28/2024 11:15   DG Chest Port 1 View Result Date: 04/28/2024 CLINICAL DATA:  Questionable sepsis.  Tachycardia. EXAM: PORTABLE CHEST 1 VIEW COMPARISON:  04/30/2023 FINDINGS: Rotated film. The cardio pericardial silhouette is enlarged. Atelectasis or infiltrate noted at the right base. Soft tissue fullness noted in the right hilar region with volume loss noted right hemithorax, likely accentuated by rotation. Bones are diffusely demineralized with degenerative changes in each shoulder. IMPRESSION: 1. Atelectasis or infiltrate at the right base. 2. Soft tissue fullness in the right hilar region with volume loss in the right hemithorax, likely accentuated by rotation. Dedicated upright PA and lateral chest x-ray recommended when patient is able. Electronically Signed   By: Camellia Candle M.D.   On: 04/28/2024 08:55         Tikisha Molinaro, DO [facility] 04/29/2024, 3:31 PM    Dictation software may have been used to generate the above note. Typos may occur and escape review in typed/dictated notes. Please contact Dr Marsa directly for clarity if needed.  Staff may message me via secure chat in Epic  but this may not receive an immediate response,  please page me for urgent matters!  If  7PM-7AM, please contact night coverage [email or web address]

## 2024-04-29 NOTE — Consult Note (Signed)
 Neurosurgery-New Consultation Evaluation 04/29/2024 Cynthia Singh [id number]  Identifying Statement: Cynthia Singh is a 83 y.o. female from [place] [id number] with pylenephritis and spinal fractures  Physician Requesting Consultation: Dr. Morene  History of Present Illness: 83 y.o. female with a history of diabetes, hypertension, GERD, CKD who comes ED complaining of nausea vomiting diarrhea for the past 3 days p/w UTI found on spinal CT to have horizontal fracture through the superior half of the L1 vertebral body and T12 posterior elements, possibly subacute. Oblique nondisplaced fracture through the anterior aspect of the T5 vertebral body, new from 2021 but of indeterminate age.Widespread thoracic and lumbar ankylosis Chronic T12 compression fracture.  Neurosurgery consulted for further evaluation.  Though minimally verbal she on repeated prompting does not endorse any LE pain or back pain.   Past Medical History:  Past Medical History:  Diagnosis Date   CAD (coronary artery disease)    a. Remote nonobstructive disease but in 10/2012 Cath/PCI: s/p DES to RCA. b. cath 02/08/15 DES to prox LAD and balloon angioplasty of ost D1, EF 55-65%; c. 08/2019 PCI/DES prox/distal RCA (overlapping prev RCA stent). Prev placed RCA/LAD stents patent.   Concussion    SAH AFTER FALL 3/18   Depression    Diabetes mellitus, type 2 (HCC)    Dyslipidemia    Episodic mood disorder    Hypertension    Myocardial infarction [facility])    2014   Obesity    Osteoarthritis of spine    knees also   Peripheral vascular disease, unspecified 2020   severe disease on home screening by insurance    Social History: Social History   Socioeconomic History   Marital status: Widowed    Spouse name: Not on file   Number of children: 3   Years of education: 22   Highest education level: Not on file  Occupational History   Occupation: CASHIER  Tobacco Use   Smoking status: Never    Passive exposure:  Past   Smokeless tobacco: Never  Vaping Use   Vaping status: Never Used  Substance and Sexual Activity   Alcohol use: No   Drug use: No   Sexual activity: Never    Birth control/protection: Post-menopausal  Other Topics Concern   Not on file  Social History Narrative   Widowed '13. 2 sons- '63, '66; one daughter-'69;    11 grandchildren--has raised 9 of her grandchildren 4 great grandchildren.   Lives with 2 granddaughters and adoptive son      Has living will   Probably would have granddaughter Verla   Would accept resuscitation but no prolonged ventilation   Not sure about tube feeds   Social Drivers of Health   Tobacco Use: Low Risk (04/28/2024)   Patient History    Smoking Tobacco Use: Never    Smokeless Tobacco Use: Never    Passive Exposure: Past  Financial Resource Strain: Medium Risk (06/24/2021)   Overall Financial Resource Strain (CARDIA)    Difficulty of Paying Living Expenses: Somewhat hard  Food Insecurity: No Food Insecurity (05/01/2023)   Hunger Vital Sign    Worried About Running Out of Food in the Last Year: Never true    Ran Out of Food in the Last Year: Never true  Transportation Needs: No Transportation Needs (05/01/2023)   PRAPARE - Administrator, Civil Service (Medical): No    Lack of Transportation (Non-Medical): No  Physical Activity: Not on file  Stress: Not on file  Social Connections:  Not on file  Intimate Partner Violence: Not At Risk (05/01/2023)   Humiliation, Afraid, Rape, and Kick questionnaire    Fear of Current or Ex-Partner: No    Emotionally Abused: No    Physically Abused: No    Sexually Abused: No  Depression (PHQ2-9): Medium Risk (02/16/2024)   Depression (PHQ2-9)    PHQ-2 Score: 8  Alcohol Screen: Not on file  Housing: Low Risk (05/01/2023)   Housing Stability Vital Sign    Unable to Pay for Housing in the Last Year: No    Number of Times Moved in the Last Year: 0    Homeless in the Last Year: No  Utilities:  Not At Risk (05/01/2023)   AHC Utilities    Threatened with loss of utilities: No  Health Literacy: Not on file   Living arrangements (living alone, with partner): family  Family History: Family History  Adopted: Yes  Problem Relation Age of Onset   Cancer Brother        Colon   Cancer Son    Coronary artery disease Neg Hx    Heart attack Neg Hx     Review of Systems:  Review of Systems - General ROS: Negative Psychological ROS: Negative Ophthalmic ROS: Negative ENT ROS: Negative Hematological and Lymphatic ROS: Negative  Endocrine ROS: Negative Respiratory ROS: Negative Cardiovascular ROS: Negative Gastrointestinal ROS: Negative Genito-Urinary ROS: Negative Musculoskeletal ROS: Negative Neurological ROS: Negative Dermatological ROS: Negative  Physical Exam: BP (!) 165/64   Pulse (!) 109   Temp 100.1 F (37.8 C)   Resp 20   Ht 5' 8 (1.727 m)   Wt 77.4 kg   SpO2 94%   BMI 25.95 kg/m  Body mass index is 25.95 kg/m. Body surface area is 1.93 meters squared. General appearance: Alert, cooperative, in no acute distress Head: Normocephalic, atraumatic Eyes: Normal, EOM intact Oropharynx: Moist without lesions Neck: Supple, no tenderness Heart: Normal, regular rate and rhythm, without murmur Lungs: Clear to auscultation, good air exchange Abdomen: Soft, nondistended Ext: No edema in LE bilaterally, good distal pulses  Neurologic exam:  Mental status: alert Speech: she answers very simple questions, AO x name only otherwise not consistently verbal but interractive tracks and follows Cranial nerves:  II: Visual fields appear grossly intact III/IV/VI: extra-ocular motions appear intact bilaterally V/VII:no evidence of facial droop or weakness and facial sensation intact VIII: hearing normal XI: trapezius strength symmetric,  sternocleidomastoid strength symmetric XII: tongue strength symmetric  Motor:strength symmetric 5/5, normal muscle mass and tone in all  extremities and no pronator drift Sensory: intact to light touch in all extremities within lmits of her engagement with exam Reflexes: 2+ and symmetric bilaterally for arms and legs Did not test gait  Laboratory: Results for orders placed or performed during the hospital encounter of 04/28/24  Resp panel by RT-PCR (RSV, Flu A&B, Covid) Anterior Nasal Swab   Collection Time: 04/28/24  8:30 AM   Specimen: Anterior Nasal Swab  Result Value Ref Range   SARS Coronavirus 2 by RT PCR NEGATIVE NEGATIVE   Influenza A by PCR NEGATIVE NEGATIVE   Influenza B by PCR NEGATIVE NEGATIVE   Resp Syncytial Virus by PCR NEGATIVE NEGATIVE  Blood Culture (routine x 2)   Collection Time: 04/28/24  8:38 AM   Specimen: BLOOD  Result Value Ref Range   Specimen Description BLOOD BLOOD LEFT ARM    Special Requests      BOTTLES DRAWN AEROBIC AND ANAEROBIC Blood Culture adequate volume   Culture  Setup Time      Organism ID to follow IN BOTH AEROBIC AND ANAEROBIC BOTTLES GRAM NEGATIVE RODS CRITICAL RESULT CALLED TO, READ BACK BY AND VERIFIED WITH: NATHAN BELUE @ 04/28/2024 2243 AB Performed at [facility], [address] [place], [place] [address]    Culture GRAM NEGATIVE RODS    Report Status PENDING   Blood Culture ID Panel (Reflexed)   Collection Time: 04/28/24  8:38 AM  Result Value Ref Range   Enterococcus faecalis NOT DETECTED NOT DETECTED   Enterococcus Faecium NOT DETECTED NOT DETECTED   Listeria monocytogenes NOT DETECTED NOT DETECTED   Staphylococcus species NOT DETECTED NOT DETECTED   Staphylococcus aureus (BCID) NOT DETECTED NOT DETECTED   Staphylococcus epidermidis NOT DETECTED NOT DETECTED   Staphylococcus lugdunensis NOT DETECTED NOT DETECTED   Streptococcus species NOT DETECTED NOT DETECTED   Streptococcus agalactiae NOT DETECTED NOT DETECTED   Streptococcus pneumoniae NOT DETECTED NOT DETECTED   Streptococcus pyogenes NOT DETECTED NOT DETECTED   A.calcoaceticus-baumannii  NOT DETECTED NOT DETECTED   Bacteroides fragilis NOT DETECTED NOT DETECTED   Enterobacterales DETECTED (A) NOT DETECTED   Enterobacter cloacae complex NOT DETECTED NOT DETECTED   Escherichia coli DETECTED (A) NOT DETECTED   Klebsiella aerogenes NOT DETECTED NOT DETECTED   Klebsiella oxytoca NOT DETECTED NOT DETECTED   Klebsiella pneumoniae NOT DETECTED NOT DETECTED   Proteus species NOT DETECTED NOT DETECTED   Salmonella species NOT DETECTED NOT DETECTED   Serratia marcescens NOT DETECTED NOT DETECTED   Haemophilus influenzae NOT DETECTED NOT DETECTED   Neisseria meningitidis NOT DETECTED NOT DETECTED   Pseudomonas aeruginosa NOT DETECTED NOT DETECTED   Stenotrophomonas maltophilia NOT DETECTED NOT DETECTED   Candida albicans NOT DETECTED NOT DETECTED   Candida auris NOT DETECTED NOT DETECTED   Candida glabrata NOT DETECTED NOT DETECTED   Candida krusei NOT DETECTED NOT DETECTED   Candida parapsilosis NOT DETECTED NOT DETECTED   Candida tropicalis NOT DETECTED NOT DETECTED   Cryptococcus neoformans/gattii NOT DETECTED NOT DETECTED   CTX-M ESBL NOT DETECTED NOT DETECTED   Carbapenem resistance IMP NOT DETECTED NOT DETECTED   Carbapenem resistance KPC NOT DETECTED NOT DETECTED   Carbapenem resistance NDM NOT DETECTED NOT DETECTED   Carbapenem resist OXA 48 LIKE NOT DETECTED NOT DETECTED   Carbapenem resistance VIM NOT DETECTED NOT DETECTED  Lactic acid, plasma   Collection Time: 04/28/24  8:38 AM  Result Value Ref Range   Lactic Acid, Venous 4.6 (HH) 0.5 - 1.9 mmol/L  Comprehensive metabolic panel   Collection Time: 04/28/24  8:38 AM  Result Value Ref Range   Sodium 134 (L) 135 - 145 mmol/L   Potassium 4.7 3.5 - 5.1 mmol/L   Chloride 99 98 - 111 mmol/L   CO2 15 (L) 22 - 32 mmol/L   Glucose, Bld 179 (H) 70 - 99 mg/dL   BUN 55 (H) 8 - 23 mg/dL   Creatinine, Ser 6.72 (H) 0.44 - 1.00 mg/dL   Calcium  7.7 (L) 8.9 - 10.3 mg/dL   Total Protein 6.3 (L) 6.5 - 8.1 g/dL   Albumin 3.0  (L) 3.5 - 5.0 g/dL   AST 45 (H) 15 - 41 U/L   ALT 71 (H) 0 - 44 U/L   Alkaline Phosphatase 165 (H) 38 - 126 U/L   Total Bilirubin 0.7 0.0 - 1.2 mg/dL   GFR, Estimated 14 (L) >60 mL/min   Anion gap 20 (H) 5 - 15  CBC with Differential   Collection Time:  04/28/24  8:38 AM  Result Value Ref Range   WBC 30.3 (H) 4.0 - 10.5 K/uL   RBC 3.99 3.87 - 5.11 MIL/uL   Hemoglobin 11.4 (L) 12.0 - 15.0 g/dL   HCT 64.3 (L) 63.9 - 53.9 %   MCV 89.2 80.0 - 100.0 fL   MCH 28.6 26.0 - 34.0 pg   MCHC 32.0 30.0 - 36.0 g/dL   RDW 84.6 88.4 - 84.4 %   Platelets 181 150 - 400 K/uL   nRBC 0.0 0.0 - 0.2 %   Neutrophils Relative % 84 %   Neutro Abs 25.4 (H) 1.7 - 7.7 K/uL   Lymphocytes Relative 3 %   Lymphs Abs 0.9 0.7 - 4.0 K/uL   Monocytes Relative 5 %   Monocytes Absolute 1.5 (H) 0.1 - 1.0 K/uL   Eosinophils Relative 0 %   Eosinophils Absolute 0.1 0.0 - 0.5 K/uL   Basophils Relative 0 %   Basophils Absolute 0.1 0.0 - 0.1 K/uL   WBC Morphology See Note    RBC Morphology MORPHOLOGY UNREMARKABLE    Smear Review Normal platelet morphology    Immature Granulocytes 8 %   Abs Immature Granulocytes 2.44 (H) 0.00 - 0.07 K/uL  Lipase, blood   Collection Time: 04/28/24  8:38 AM  Result Value Ref Range   Lipase <10 (L) 11 - 51 U/L  Magnesium   Collection Time: 04/28/24  8:38 AM  Result Value Ref Range   Magnesium 1.5 (L) 1.7 - 2.4 mg/dL  Urinalysis, w/ Reflex to Culture (Infection Suspected) -Urine, Clean Catch   Collection Time: 04/28/24 11:03 AM  Result Value Ref Range   Specimen Source URINE, CLEAN CATCH    Color, Urine AMBER (A) YELLOW   APPearance TURBID (A) CLEAR   Specific Gravity, Urine 1.013 1.005 - 1.030   pH 6.0 5.0 - 8.0   Glucose, UA >=500 (A) NEGATIVE mg/dL   Hgb urine dipstick LARGE (A) NEGATIVE   Bilirubin Urine NEGATIVE NEGATIVE   Ketones, ur NEGATIVE NEGATIVE mg/dL   Protein, ur 899 (A) NEGATIVE mg/dL   Nitrite POSITIVE (A) NEGATIVE   Leukocytes,Ua MODERATE (A) NEGATIVE   RBC /  HPF >50 0 - 5 RBC/hpf   WBC, UA >50 0 - 5 WBC/hpf   Bacteria, UA MANY (A) NONE SEEN   Squamous Epithelial / HPF 11-20 0 - 5 /HPF   WBC Clumps PRESENT    Non Squamous Epithelial PRESENT (A) NONE SEEN  Lactic acid, plasma   Collection Time: 04/28/24 11:08 AM  Result Value Ref Range   Lactic Acid, Venous 2.8 (HH) 0.5 - 1.9 mmol/L  Blood Culture (routine x 2)   Collection Time: 04/28/24  1:46 PM   Specimen: BLOOD  Result Value Ref Range   Specimen Description BLOOD BLOOD LEFT WRIST    Special Requests      BOTTLES DRAWN AEROBIC AND ANAEROBIC Blood Culture adequate volume   Culture      NO GROWTH < 24 HOURS Performed at [facility], [address] [place], [place] [address]    Report Status PENDING   Protime-INR   Collection Time: 04/28/24  1:46 PM  Result Value Ref Range   Prothrombin Time 17.9 (H) 11.4 - 15.2 seconds   INR 1.4 (H) 0.8 - 1.2  APTT   Collection Time: 04/28/24  1:46 PM  Result Value Ref Range   aPTT 39 (H) 24 - 36 seconds  CBG monitoring, ED   Collection Time: 04/28/24  4:27 PM  Result Value Ref Range   Glucose-Capillary 146 (H) 70 - 99 mg/dL  Lactic acid, plasma   Collection Time: 04/28/24  4:30 PM  Result Value Ref Range   Lactic Acid, Venous 1.9 0.5 - 1.9 mmol/L  Basic metabolic panel   Collection Time: 04/29/24  3:45 AM  Result Value Ref Range   Sodium 139 135 - 145 mmol/L   Potassium 4.4 3.5 - 5.1 mmol/L   Chloride 108 98 - 111 mmol/L   CO2 17 (L) 22 - 32 mmol/L   Glucose, Bld 134 (H) 70 - 99 mg/dL   BUN 59 (H) 8 - 23 mg/dL   Creatinine, Ser 7.09 (H) 0.44 - 1.00 mg/dL   Calcium  8.3 (L) 8.9 - 10.3 mg/dL   GFR, Estimated 16 (L) >60 mL/min   Anion gap 14 5 - 15  CBC   Collection Time: 04/29/24  3:45 AM  Result Value Ref Range   WBC 19.1 (H) 4.0 - 10.5 K/uL   RBC 3.74 (L) 3.87 - 5.11 MIL/uL   Hemoglobin 10.9 (L) 12.0 - 15.0 g/dL   HCT 67.1 (L) 63.9 - 53.9 %   MCV 87.7 80.0 - 100.0 fL   MCH 29.1 26.0 - 34.0 pg   MCHC 33.2 30.0 - 36.0  g/dL   RDW 84.4 88.4 - 84.4 %   Platelets 146 (L) 150 - 400 K/uL   nRBC 0.0 0.0 - 0.2 %   I personally reviewed labs  Imaging:  CT   IMPRESSION: 1. Horizontal fracture through the superior half of the L1 vertebral body and T12 posterior elements, possibly subacute. 2. Oblique nondisplaced fracture through the anterior aspect of the T5 vertebral body, new from 2021 but of indeterminate age. 3. Widespread thoracic and lumbar ankylosis 4. Chronic T12 compression fracture.   Electronically signed by: Dasie Hamburg MD 04/28/2024 06:37 PM EST RP Workstation: [id number]    I personally reviewed radiology studies to include:   Impression/Plan:   83 y.o. female with a history of diabetes, hypertension, GERD, CKD who comes ED complaining of nausea vomiting diarrhea for the past 3 days p/w UTI found on CT to have horizontal fracture likely old of L1 vertebral body and T12 laminar fracture, nondisplacd linear fracture superior aspect of T5.  Normal alignment.  Her mental status somewhat limits complete LE exam evaluation but to multiple prompting appears neurologically intact which is reassuring.  These are likely older fractures being found in the setting of her current ill state with UTI.    1.  Diagnosis L1/T12/T5 fractures   2.  Plan No acute neurosurgical intervention TLSO brace when OOB, ok to take off with shower OK to work with PT/OT as needed Will arrange outpatient neurosurgery follow-up.  Belvie JINNY Felix MD Neurosurgery

## 2024-04-30 LAB — CBC
HCT: 35.2 % — ABNORMAL LOW (ref 36.0–46.0)
Hemoglobin: 11.2 g/dL — ABNORMAL LOW (ref 12.0–15.0)
MCH: 28.9 pg (ref 26.0–34.0)
MCHC: 31.8 g/dL (ref 30.0–36.0)
MCV: 90.7 fL (ref 80.0–100.0)
Platelets: 132 K/uL — ABNORMAL LOW (ref 150–400)
RBC: 3.88 MIL/uL (ref 3.87–5.11)
RDW: 15.9 % — ABNORMAL HIGH (ref 11.5–15.5)
WBC: 18.5 K/uL — ABNORMAL HIGH (ref 4.0–10.5)
nRBC: 0 % (ref 0.0–0.2)

## 2024-04-30 LAB — BASIC METABOLIC PANEL WITH GFR
Anion gap: 13 (ref 5–15)
BUN: 62 mg/dL — ABNORMAL HIGH (ref 8–23)
CO2: 18 mmol/L — ABNORMAL LOW (ref 22–32)
Calcium: 8.6 mg/dL — ABNORMAL LOW (ref 8.9–10.3)
Chloride: 109 mmol/L (ref 98–111)
Creatinine, Ser: 2.38 mg/dL — ABNORMAL HIGH (ref 0.44–1.00)
GFR, Estimated: 20 mL/min — ABNORMAL LOW (ref >60)
Glucose, Bld: 87 mg/dL (ref 70–99)
Potassium: 5 mmol/L (ref 3.5–5.1)
Sodium: 140 mmol/L (ref 135–145)

## 2024-04-30 LAB — BLOOD CULTURE ID PANEL (REFLEXED) - BCID2
A.calcoaceticus-baumannii: NOT DETECTED
Bacteroides fragilis: NOT DETECTED
Candida albicans: DETECTED — AB
Candida auris: NOT DETECTED
Candida glabrata: DETECTED — AB
Candida krusei: NOT DETECTED
Candida parapsilosis: NOT DETECTED
Candida tropicalis: NOT DETECTED
Cryptococcus neoformans/gattii: NOT DETECTED
Enterobacter cloacae complex: NOT DETECTED
Enterobacterales: NOT DETECTED
Enterococcus Faecium: NOT DETECTED
Enterococcus faecalis: NOT DETECTED
Escherichia coli: NOT DETECTED
Haemophilus influenzae: NOT DETECTED
Klebsiella aerogenes: NOT DETECTED
Klebsiella oxytoca: NOT DETECTED
Klebsiella pneumoniae: NOT DETECTED
Listeria monocytogenes: NOT DETECTED
Neisseria meningitidis: NOT DETECTED
Proteus species: NOT DETECTED
Pseudomonas aeruginosa: NOT DETECTED
Salmonella species: NOT DETECTED
Serratia marcescens: NOT DETECTED
Staphylococcus aureus (BCID): NOT DETECTED
Staphylococcus epidermidis: NOT DETECTED
Staphylococcus lugdunensis: NOT DETECTED
Staphylococcus species: NOT DETECTED
Stenotrophomonas maltophilia: NOT DETECTED
Streptococcus agalactiae: NOT DETECTED
Streptococcus pneumoniae: NOT DETECTED
Streptococcus pyogenes: NOT DETECTED
Streptococcus species: NOT DETECTED

## 2024-04-30 LAB — HEPATIC FUNCTION PANEL
ALT: 24 U/L (ref 0–44)
AST: 20 U/L (ref 15–41)
Albumin: 2.6 g/dL — ABNORMAL LOW (ref 3.5–5.0)
Alkaline Phosphatase: 165 U/L — ABNORMAL HIGH (ref 38–126)
Bilirubin, Direct: <0.1 mg/dL (ref 0.0–0.2)
Total Bilirubin: 0.3 mg/dL (ref 0.0–1.2)
Total Protein: 6.3 g/dL — ABNORMAL LOW (ref 6.5–8.1)

## 2024-04-30 LAB — URINE CULTURE: Culture: 50000 — AB

## 2024-04-30 LAB — GLUCOSE, CAPILLARY
Glucose-Capillary: 87 mg/dL (ref 70–99)
Glucose-Capillary: 105 mg/dL — ABNORMAL HIGH (ref 70–99)
Glucose-Capillary: 133 mg/dL — ABNORMAL HIGH (ref 70–99)
Glucose-Capillary: 84 mg/dL (ref 70–99)

## 2024-04-30 MED ORDER — SODIUM CHLORIDE 0.9 % IV SOLN
100.0000 mg | INTRAVENOUS | Status: DC
  Administered 2024-04-30 – 2024-05-04 (×5): 100 mg via INTRAVENOUS
  Filled 2024-04-30 (×5): qty 5

## 2024-04-30 NOTE — Evaluation (Signed)
 Physical Therapy Evaluation Patient Details Name: Cynthia Singh MRN: [id number] DOB: 01-25-1941 Today's Date: 04/30/2024  History of Present Illness  83 y.o. female admitted with sepsis & acute metabolic encephalopathy due to UTI. Also found to have subacute vertebral fracture L1, T5, T12, no neurosurgical intervention, TLSO brace when OOB, ok to take off with shower. PMH significant for diabetes, HTN, GERD, CKD, MI, obesity, CAD, OA  Clinical Impression  Pt anxious and resistant to much of proposed PT activity today.  She had little pain at rest but even with the smallest amount of mobility was highly resistant and pain limited.  We were able to get up to sitting EOB with heavy cuing and assist but ultimately she demanded to lay back down and was resistant to the idea of putting on TLSO and attempting to stand.  Pt reports she is able to do in home mobility with AD relatively consistently and seems far from her baseline at this time.  Pt will benefit from continued PT to address functional limitations.         If plan is discharge home, recommend the following: A lot of help with walking and/or transfers;A lot of help with bathing/dressing/bathroom;Assistance with cooking/housework;Help with stairs or ramp for entrance;Assist for transportation   Can travel by private vehicle   No    Equipment Recommendations  (TBD at next venue of care)  Recommendations for Other Services       Functional Status Assessment Patient has had a recent decline in their functional status and demonstrates the ability to make significant improvements in function in a reasonable and predictable amount of time.     Precautions / Restrictions Precautions Precautions: Fall;Back Required Braces or Orthoses: Spinal Brace Spinal Brace: Thoracolumbosacral orthotic;Other (comment) Spinal Brace Comments: when OOB Restrictions Weight Bearing Restrictions Per Provider Order: No      Mobility  Bed  Mobility Overal bed mobility: Needs Assistance Bed Mobility: Sidelying to Sit, Rolling, Sit to Supine Rolling: Max assist Sidelying to sit: Max assist   Sit to supine: Max assist   General bed mobility comments: Pt again resistant to mobility, poor tolerance, c/o pain and calling out during transition to sitting, t/o brief time in sitting, during heavily assisted transition back to bed and during repositioning in bed post return to supine    Transfers                   General transfer comment: pt refused, did not attempt TLSO donning 2/2 c/o pain and demanding return to supine    Ambulation/Gait                  Stairs            Wheelchair Mobility     Tilt Bed    Modified Rankin (Stroke Patients Only)       Balance Overall balance assessment: History of Falls                                           Pertinent Vitals/Pain Pain Assessment Pain Assessment: 0-10 Pain Score: 2  (at rest, c/o 9/10 pain with essentially any movement) Pain Location: mid/low back - severe pain with movement Pain Descriptors / Indicators: Grimacing, Guarding, Discomfort    Home Living Family/patient expects to be discharged to:: Private residence Living Arrangements: Other relatives Available Help at Discharge: Family;Available PRN/intermittently Type  of Home: House Home Access: Stairs to enter Entrance Stairs-Rails: Right Entrance Stairs-Number of Steps: 5   Home Layout: One level Home Equipment: Tub bench;Grab bars - toilet;Grab bars - tub/shower;Cane - quad;Rollator (4 wheels);Rolling Walker (2 wheels)      Prior Function Prior Level of Function : Patient poor historian/Family not available             Mobility Comments: pt indicates that she is able to do some in home mobility but family is always around and does help - reports multiple falls in last 6 months       Extremity/Trunk Assessment   Upper Extremity Assessment Upper  Extremity Assessment: Generalized weakness    Lower Extremity Assessment Lower Extremity Assessment: Generalized weakness (pt with limited willingness to do much with LEs 2/2 c/o pain with essentially all movement)       Communication   Communication Communication: Impaired Factors Affecting Communication:  (generalized confusion and pain focused t/o)    Cognition Arousal: Alert Behavior During Therapy: Anxious, Restless   PT - Cognitive impairments: Difficult to assess                       PT - Cognition Comments: Pt displays general confusion on arrival, educated on call bell, etc - she was consistently off topic and needing reinforcement to stay on track with regard to PT goals/expectations Following commands: Impaired Following commands impaired: Follows one step commands inconsistently     Cueing Cueing Techniques: Verbal cues, Gestural cues, Tactile cues     General Comments General comments (skin integrity, edema, etc.): Pt anxious t/o the session, resistant to most activity, generally not showing a lot of effort with cued activity and c/o considerable pain with all movement    Exercises     Assessment/Plan    PT Assessment Patient needs continued PT services  PT Problem List Decreased strength;Decreased range of motion;Decreased activity tolerance;Decreased balance;Decreased mobility;Decreased knowledge of use of DME;Decreased safety awareness;Pain;Cardiopulmonary status limiting activity;Decreased knowledge of precautions       PT Treatment Interventions DME instruction;Gait training;Functional mobility training;Therapeutic activities;Therapeutic exercise;Balance training;Neuromuscular re-education;Cognitive remediation;Patient/family education;Wheelchair mobility training    PT Goals (Current goals can be found in the Care Plan section)  Acute Rehab PT Goals Patient Stated Goal: not to over-do it today PT Goal Formulation: With patient Time For Goal  Achievement: 05/13/24 Potential to Achieve Goals: Fair    Frequency Min 2X/week     Co-evaluation               AM-PAC PT 6 Clicks Mobility  Outcome Measure Help needed turning from your back to your side while in a flat bed without using bedrails?: A Lot Help needed moving from lying on your back to sitting on the side of a flat bed without using bedrails?: Total Help needed moving to and from a bed to a chair (including a wheelchair)?: Total Help needed standing up from a chair using your arms (e.g., wheelchair or bedside chair)?: Total Help needed to walk in hospital room?: Total Help needed climbing 3-5 steps with a railing? : Total 6 Click Score: 7    End of Session Equipment Utilized During Treatment: Oxygen (SpO2 high 80s-low 90s on 3L O2 - did improve with cues for focused breathing) Activity Tolerance: Patient limited by pain Patient left: with bed alarm set;with call bell/phone within reach Nurse Communication: Mobility status PT Visit Diagnosis: Unsteadiness on feet (R26.81);Muscle weakness (generalized) (M62.81);Difficulty in walking, not elsewhere  classified (R26.2);Pain Pain - part of body:  (lumbago)    Time: 9083-9061 PT Time Calculation (min) (ACUTE ONLY): 22 min   Charges:   PT Evaluation $PT Eval Low Complexity: 1 Low PT Treatments $Therapeutic Activity: 8-22 mins PT General Charges $$ ACUTE PT VISIT: 1 Visit         Carmin JONELLE Deed, DPT 04/30/2024, 10:36 AM

## 2024-04-30 NOTE — Progress Notes (Signed)
 PHARMACY - PHYSICIAN COMMUNICATION CRITICAL VALUE ALERT - BLOOD CULTURE IDENTIFICATION (BCID)  Results for orders placed or performed during the hospital encounter of 04/28/24  Resp panel by RT-PCR (RSV, Flu A&B, Covid) Anterior Nasal Swab     Status: None   Collection Time: 04/28/24  8:30 AM   Specimen: Anterior Nasal Swab  Result Value Ref Range Status   SARS Coronavirus 2 by RT PCR NEGATIVE NEGATIVE Final    Comment: (NOTE) SARS-CoV-2 target nucleic acids are NOT DETECTED.  The SARS-CoV-2 RNA is generally detectable in upper respiratory specimens during the acute phase of infection. The lowest concentration of SARS-CoV-2 viral copies this assay can detect is 138 copies/mL. A negative result does not preclude SARS-Cov-2 infection and should not be used as the sole basis for treatment or other patient management decisions. A negative result may occur with  improper specimen collection/handling, submission of specimen other than nasopharyngeal swab, presence of viral mutation(s) within the areas targeted by this assay, and inadequate number of viral copies(<138 copies/mL). A negative result must be combined with clinical observations, patient history, and epidemiological information. The expected result is Negative.  Fact Sheet for Patients:  bloggercourse.com  Fact Sheet for Healthcare Providers:  seriousbroker.it  This test is no t yet approved or cleared by the [place]  FDA and  has been authorized for detection and/or diagnosis of SARS-CoV-2 by FDA under an Emergency Use Authorization (EUA). This EUA will remain  in effect (meaning this test can be used) for the duration of the COVID-19 declaration under Section 564(b)(1) of the Act, 21 U.S.C.section 360bbb-3(b)(1), unless the authorization is terminated  or revoked sooner.       Influenza A by PCR NEGATIVE NEGATIVE Final   Influenza B by PCR NEGATIVE NEGATIVE Final     Comment: (NOTE) The Xpert Xpress SARS-CoV-2/FLU/RSV plus assay is intended as an aid in the diagnosis of influenza from Nasopharyngeal swab specimens and should not be used as a sole basis for treatment. Nasal washings and aspirates are unacceptable for Xpert Xpress SARS-CoV-2/FLU/RSV testing.  Fact Sheet for Patients: bloggercourse.com  Fact Sheet for Healthcare Providers: seriousbroker.it  This test is not yet approved or cleared by the [place]  FDA and has been authorized for detection and/or diagnosis of SARS-CoV-2 by FDA under an Emergency Use Authorization (EUA). This EUA will remain in effect (meaning this test can be used) for the duration of the COVID-19 declaration under Section 564(b)(1) of the Act, 21 U.S.C. section 360bbb-3(b)(1), unless the authorization is terminated or revoked.     Resp Syncytial Virus by PCR NEGATIVE NEGATIVE Final    Comment: (NOTE) Fact Sheet for Patients: bloggercourse.com  Fact Sheet for Healthcare Providers: seriousbroker.it  This test is not yet approved or cleared by the [place]  FDA and has been authorized for detection and/or diagnosis of SARS-CoV-2 by FDA under an Emergency Use Authorization (EUA). This EUA will remain in effect (meaning this test can be used) for the duration of the COVID-19 declaration under Section 564(b)(1) of the Act, 21 U.S.C. section 360bbb-3(b)(1), unless the authorization is terminated or revoked.  Performed at [facility], [address] [place], [place] [address]   Blood Culture (routine x 2)     Status: None (Preliminary result)   Collection Time: 04/28/24  8:38 AM   Specimen: BLOOD  Result Value Ref Range Status   Specimen Description BLOOD BLOOD LEFT ARM  Final   Special Requests   Final    BOTTLES DRAWN AEROBIC AND  ANAEROBIC Blood Culture adequate volume   Culture  Setup  Time   Final    Organism ID to follow IN BOTH AEROBIC AND ANAEROBIC BOTTLES GRAM NEGATIVE RODS CRITICAL RESULT CALLED TO, READ BACK BY AND VERIFIED WITH: Jailah Willis @ 04/28/2024 2243 AB Performed at [facility], [address] [place], [place] [address]    Culture GRAM NEGATIVE RODS  Final   Report Status PENDING  Incomplete  Blood Culture ID Panel (Reflexed)     Status: Abnormal   Collection Time: 04/28/24  8:38 AM  Result Value Ref Range Status   Enterococcus faecalis NOT DETECTED NOT DETECTED Final   Enterococcus Faecium NOT DETECTED NOT DETECTED Final   Listeria monocytogenes NOT DETECTED NOT DETECTED Final   Staphylococcus species NOT DETECTED NOT DETECTED Final   Staphylococcus aureus (BCID) NOT DETECTED NOT DETECTED Final   Staphylococcus epidermidis NOT DETECTED NOT DETECTED Final   Staphylococcus lugdunensis NOT DETECTED NOT DETECTED Final   Streptococcus species NOT DETECTED NOT DETECTED Final   Streptococcus agalactiae NOT DETECTED NOT DETECTED Final   Streptococcus pneumoniae NOT DETECTED NOT DETECTED Final   Streptococcus pyogenes NOT DETECTED NOT DETECTED Final   A.calcoaceticus-baumannii NOT DETECTED NOT DETECTED Final   Bacteroides fragilis NOT DETECTED NOT DETECTED Final   Enterobacterales DETECTED (A) NOT DETECTED Final    Comment: Enterobacterales represent a large order of gram negative bacteria, not a single organism. CRITICAL RESULT CALLED TO, READ BACK BY AND VERIFIED WITH: Samika Vetsch @ 04/28/2024 2243 AB    Enterobacter cloacae complex NOT DETECTED NOT DETECTED Final   Escherichia coli DETECTED (A) NOT DETECTED Final    Comment: CRITICAL RESULT CALLED TO, READ BACK BY AND VERIFIED WITH: Dimitrios Balestrieri @ 04/28/2024 2243 AB    Klebsiella aerogenes NOT DETECTED NOT DETECTED Final   Klebsiella oxytoca NOT DETECTED NOT DETECTED Final   Klebsiella pneumoniae NOT DETECTED NOT DETECTED Final   Proteus species NOT DETECTED NOT DETECTED Final    Salmonella species NOT DETECTED NOT DETECTED Final   Serratia marcescens NOT DETECTED NOT DETECTED Final   Haemophilus influenzae NOT DETECTED NOT DETECTED Final   Neisseria meningitidis NOT DETECTED NOT DETECTED Final   Pseudomonas aeruginosa NOT DETECTED NOT DETECTED Final   Stenotrophomonas maltophilia NOT DETECTED NOT DETECTED Final   Candida albicans NOT DETECTED NOT DETECTED Final   Candida auris NOT DETECTED NOT DETECTED Final   Candida glabrata NOT DETECTED NOT DETECTED Final   Candida krusei NOT DETECTED NOT DETECTED Final   Candida parapsilosis NOT DETECTED NOT DETECTED Final   Candida tropicalis NOT DETECTED NOT DETECTED Final   Cryptococcus neoformans/gattii NOT DETECTED NOT DETECTED Final   CTX-M ESBL NOT DETECTED NOT DETECTED Final   Carbapenem resistance IMP NOT DETECTED NOT DETECTED Final   Carbapenem resistance KPC NOT DETECTED NOT DETECTED Final   Carbapenem resistance NDM NOT DETECTED NOT DETECTED Final   Carbapenem resist OXA 48 LIKE NOT DETECTED NOT DETECTED Final   Carbapenem resistance VIM NOT DETECTED NOT DETECTED Final    Comment: Performed at [facility], [address] [place], [place] [address]  Urine Culture (for pregnant, neutropenic or urologic patients or patients with an indwelling urinary catheter)     Status: Abnormal (Preliminary result)   Collection Time: 04/28/24 11:03 AM   Specimen: Urine, Clean Catch  Result Value Ref Range Status   Specimen Description   Final    URINE, CLEAN CATCH Performed at [facility], [address] [place], [place] [address]  Special Requests   Final    NONE Performed at [facility], [address] [place], [place] [address]    Culture (A)  Final    50,000 COLONIES/mL ESCHERICHIA COLI SUSCEPTIBILITIES TO FOLLOW Performed at [facility], [address] [place], [place] [address]    Report Status PENDING  Incomplete  Blood Culture (routine x 2)     Status: Abnormal  (Preliminary result)   Collection Time: 04/28/24  1:46 PM   Specimen: BLOOD  Result Value Ref Range Status   Specimen Description BLOOD BLOOD LEFT WRIST  Final   Special Requests   Final    BOTTLES DRAWN AEROBIC AND ANAEROBIC Blood Culture adequate volume   Culture  Setup Time (A)  Final    YEAST AEROBIC BOTTLE ONLY Organism ID to follow CRITICAL RESULT CALLED TO, READ BACK BY AND VERIFIED WITH: Nazim Kadlec @ 04/30/2024 0216 AB Performed at [facility], [address] [place], [place] [address]    Culture YEAST (A)  Final   Report Status PENDING  Incomplete  Blood Culture ID Panel (Reflexed)     Status: Abnormal   Collection Time: 04/28/24  1:46 PM  Result Value Ref Range Status   Enterococcus faecalis NOT DETECTED NOT DETECTED Final   Enterococcus Faecium NOT DETECTED NOT DETECTED Final   Listeria monocytogenes NOT DETECTED NOT DETECTED Final   Staphylococcus species NOT DETECTED NOT DETECTED Final   Staphylococcus aureus (BCID) NOT DETECTED NOT DETECTED Final   Staphylococcus epidermidis NOT DETECTED NOT DETECTED Final   Staphylococcus lugdunensis NOT DETECTED NOT DETECTED Final   Streptococcus species NOT DETECTED NOT DETECTED Final   Streptococcus agalactiae NOT DETECTED NOT DETECTED Final   Streptococcus pneumoniae NOT DETECTED NOT DETECTED Final   Streptococcus pyogenes NOT DETECTED NOT DETECTED Final   A.calcoaceticus-baumannii NOT DETECTED NOT DETECTED Final   Bacteroides fragilis NOT DETECTED NOT DETECTED Final   Enterobacterales NOT DETECTED NOT DETECTED Final   Enterobacter cloacae complex NOT DETECTED NOT DETECTED Final   Escherichia coli NOT DETECTED NOT DETECTED Final   Klebsiella aerogenes NOT DETECTED NOT DETECTED Final   Klebsiella oxytoca NOT DETECTED NOT DETECTED Final   Klebsiella pneumoniae NOT DETECTED NOT DETECTED Final   Proteus species NOT DETECTED NOT DETECTED Final   Salmonella species NOT DETECTED NOT DETECTED Final   Serratia  marcescens NOT DETECTED NOT DETECTED Final   Haemophilus influenzae NOT DETECTED NOT DETECTED Final   Neisseria meningitidis NOT DETECTED NOT DETECTED Final   Pseudomonas aeruginosa NOT DETECTED NOT DETECTED Final   Stenotrophomonas maltophilia NOT DETECTED NOT DETECTED Final   Candida albicans DETECTED (A) NOT DETECTED Final    Comment: CRITICAL RESULT CALLED TO, READ BACK BY AND VERIFIED WITH: Verlin Duke @ 04/30/2024 0216 AB    Candida auris NOT DETECTED NOT DETECTED Final   Candida glabrata DETECTED (A) NOT DETECTED Final    Comment: CRITICAL RESULT CALLED TO, READ BACK BY AND VERIFIED WITH: Charie Pinkus @ 04/30/2024 0216 AB    Candida krusei NOT DETECTED NOT DETECTED Final   Candida parapsilosis NOT DETECTED NOT DETECTED Final   Candida tropicalis NOT DETECTED NOT DETECTED Final   Cryptococcus neoformans/gattii NOT DETECTED NOT DETECTED Final    Comment: Performed at [facility], [address] [place], [place] [address]    BCID Results: 3rd (aerobic) of 4 bottles with Candida albicans and Candida glabrata.  Pt currently on Ceftriaxone  2 gm for E. Coli bacteremia.  Name of provider contacted: DOROTHA Peaches,  MD   Changes to prescribed antibiotics required: Add micafungin  100 mg q24hr for Canidemia.  Rankin CANDIE Dills, PharmD, [facility] 04/30/2024 2:29 AM

## 2024-04-30 NOTE — Progress Notes (Signed)
 PROGRESS NOTE    Cynthia Singh   FMW:[id number] DOB: 1941-01-28  DOA: 04/28/2024 Date of Service: 04/30/2024 which is hospital day 2  PCP: Wendee Lynwood HERO, NP    Hospital course / significant events:   HPI: Cynthia Singh is a 83 y.o. year old female with medical history of hypertension, hyperlipidemia, type 2 diabetes, CKD 3A presenting to the ED with nausea, vomiting, diarrhea, suprapubic abd pain, chills without fever, and generalized weakness. Disoriented and hx limited. Family reports no underlying cognitive problems.   12/12: to ED. Admitted for UTI. Chest x-ray with atelectasis and CT abdomen pelvis shows mild left renal pyelectasis and ureterectasis with gas in the renal collecting system with concern for VUR.  12/13: (+)Ecoli cultures urine and blood, continue abx. Neurosurgery has seen pt, no intervention planned. Alert but remains confused.  12/14: (+)candidemia resulted overnight, added micofungin. Mental status is improved today      Consultants:  neurosurgery  Procedures/Surgeries: noen      ASSESSMENT & PLAN:   E.Coli UTI Sepsis and bacteremia, fungemia d/t UTI  BCx L wrist collected 12/12 @13 :46 --> (+)yeast in aerobic bottle, candida albicans, candida glabrata BCx L arm collected 12/12 @08 :38 --> (+)Ecoli in aerobic and anaerobic  UCx collected 12/12 @11 :03 --> (+)Ecoli sensitive to ceftriaxone  Hematuria d/t UTI Lactic acidosis - resolved  Leukocytosis - improving Ceftriaxone , Micofungin Blood culture (+)enterobacterales / Ecoli pending susceptibilities, (+)yeast  Monitor sepsis parameters and mental status Repeat BCx tomorrow   Acute metabolic encephalopathy d/t UTI / uremia  Delirium precautions  Swallow screen passes w/ some coughing, will trial dysphagia diet   AKI d/t UTI/dehydration - improvement question also d/t vesicoureteral reflux / retention AKI on CKD3a Cr 3.27 on admission --> 2.9 HD2 04/29/2024 --> 2.4 HD3 IV fluids Monitor  BMP Tx underlying cause(s) Foley continue   Subacute vertebral fracture L1, T12 Per neurosurgery --> no intervention acutely  TLSO brace when out of bed   PT/OT as able Outpatient neurosurgery follow up   Elevated LFTs, mild  Exact etiology of this is unknown  No concerns RUQ US  hepatitis panel  Trend    Hypertension Hold home meds given sepsis / risk hypotension   Type 2 diabetes At home she is on insulin  and metformin  and Jardiance  SSI while here   Hyperlipidemia/CAD:  Holding statin w/ borderline LFT ASA, Plavix     GERD: Appears to be controlled via lifestyle as she is not on a PPI or H2 blocker PPI as needed   Recent fall PT/OT Neurosurgery as above     overweight based on BMI: Body mass index is 25.95 kg/m.[name] Significantly low or high BMI is associated with higher medical risk.  Underweight - under 18  overweight - 25 to 29 obese - 30 or more Class 1 obesity: BMI of 30.0 to 34 Class 2 obesity: BMI of 35.0 to 39 Class 3 obesity: BMI of 40.0 to 49 Super Morbid Obesity: BMI 50-59 Super-super Morbid Obesity: BMI 60+ Healthy nutrition and physical activity advised as adjunct to other disease management and risk reduction treatments    DVT prophylaxis: heparin  IV fluids: LR continuous IV fluids  Nutrition: dysphagia diet Central lines / other devices: Foley cath  Code Status: FULL CODE ACP documentation reviewed:  none on file in [facility]  TOC needs: TBD Medical barriers to dispo: IV abx, bacteremia. Expected medical readiness for discharge few days.           Subjective / Brief ROS:  Patient denies  pain Much more alert today Tired but otherwise no complaints    Family Communication: spoke on phone w/ grandson 04/30/2024 5:25 PM all questions answered     Objective Findings:  Vitals:   04/30/24 0500 04/30/24 0956 04/30/24 1124 04/30/24 1548  BP:  (!) 146/71 (!) 167/78 (!) 161/80  Pulse:  94 89 92  Resp:  18 20 20   Temp:  98.1 F (36.7  C) 97.7 F (36.5 C) 97.7 F (36.5 C)  TempSrc:      SpO2:  100% 99% (!) 64%  Weight: 77.3 kg     Height:        Intake/Output Summary (Last 24 hours) at 04/30/2024 1725 Last data filed at 04/30/2024 1428 Gross per 24 hour  Intake 2333.92 ml  Output --  Net 2333.92 ml   Filed Weights   04/28/24 0832 04/29/24 0500 04/30/24 0500  Weight: 76.3 kg 77.4 kg 77.3 kg    Examination:  Physical Exam Constitutional:      General: She is not in acute distress.    Appearance: She is ill-appearing.  Cardiovascular:     Rate and Rhythm: Normal rate and regular rhythm.  Pulmonary:     Effort: Pulmonary effort is normal.     Breath sounds: Normal breath sounds.  Abdominal:     Palpations: Abdomen is soft.  Neurological:     Mental Status: She is alert and oriented to person, place, and time. Mental status is at baseline.  Psychiatric:        Mood and Affect: Mood normal.        Behavior: Behavior normal.          Scheduled Medications:   amLODipine   2.5 mg Oral Daily   aspirin  EC  81 mg Oral Daily   clopidogrel   75 mg Oral Daily   heparin   5,000 Units Subcutaneous Q8H   insulin  aspart  0-6 Units Subcutaneous TID WC   nortriptyline   150 mg Oral QHS   sodium chloride  flush  3 mL Intravenous Q12H    Continuous Infusions:  cefTRIAXone  (ROCEPHIN )  IV 2 g (04/30/24 1600)   micafungin  (MYCAMINE ) 100 mg in sodium chloride  0.9 % 100 mL IVPB 100 mg (04/30/24 0827)    PRN Medications:  acetaminophen  **OR** acetaminophen , hydrALAZINE , HYDROmorphone  (DILAUDID ) injection, ondansetron  **OR** ondansetron  (ZOFRAN ) IV, mouth rinse, oxyCODONE , senna-docusate  Antimicrobials from admission:  Anti-infectives (From admission, onward)    Start     Dose/Rate Route Frequency Ordered Stop   04/30/24 0400  micafungin  (MYCAMINE ) 100 mg in sodium chloride  0.9 % 100 mL IVPB        100 mg 105 mL/hr over 1 Hours Intravenous Every 24 hours 04/30/24 0243     04/28/24 1600  cefTRIAXone  (ROCEPHIN )  2 g in sodium chloride  0.9 % 100 mL IVPB        2 g 200 mL/hr over 30 Minutes Intravenous Every 24 hours 04/28/24 1315 05/05/24 1559   04/28/24 0930  ceFEPIme  (MAXIPIME ) 2 g in sodium chloride  0.9 % 100 mL IVPB        2 g 200 mL/hr over 30 Minutes Intravenous  Once 04/28/24 0923 04/28/24 1022   04/28/24 0930  metroNIDAZOLE  (FLAGYL ) IVPB 500 mg        500 mg 100 mL/hr over 60 Minutes Intravenous  Once 04/28/24 9076 04/28/24 1136           Data Reviewed:  I have personally reviewed the following...  CBC: Recent Labs  Lab 04/28/24 [phone]  04/29/24 0345 04/30/24 0345  WBC 30.3* 19.1* 18.5*  NEUTROABS 25.4*  --   --   HGB 11.4* 10.9* 11.2*  HCT 35.6* 32.8* 35.2*  MCV 89.2 87.7 90.7  PLT 181 146* 132*   Basic Metabolic Panel: Recent Labs  Lab 04/28/24 0838 04/29/24 0345 04/30/24 0345  NA 134* 139 140  K 4.7 4.4 5.0  CL 99 108 109  CO2 15* 17* 18*  GLUCOSE 179* 134* 87  BUN 55* 59* 62*  CREATININE 3.27* 2.90* 2.38*  CALCIUM  7.7* 8.3* 8.6*  MG 1.5*  --   --    GFR: Estimated Creatinine Clearance: 20.6 mL/min (A) (by C-G formula based on SCr of 2.38 mg/dL (H)). Liver Function Tests: Recent Labs  Lab 04/28/24 0838 04/30/24 0345  AST 45* 20  ALT 71* 24  ALKPHOS 165* 165*  BILITOT 0.7 0.3  PROT 6.3* 6.3*  ALBUMIN 3.0* 2.6*   Recent Labs  Lab 04/28/24 0838  LIPASE <10*   No results for input(s): AMMONIA in the last 168 hours. Coagulation Profile: Recent Labs  Lab 04/28/24 1346  INR 1.4*   Cardiac Enzymes: No results for input(s): CKTOTAL, CKMB, CKMBINDEX, TROPONINI in the last 168 hours. BNP (last 3 results) No results for input(s): PROBNP in the last 8760 hours. HbA1C: No results for input(s): HGBA1C in the last 72 hours. CBG: Recent Labs  Lab 04/29/24 1715 04/29/24 2251 04/30/24 0830 04/30/24 1202 04/30/24 1556  GLUCAP 141* 93 87 105* 133*   Lipid Profile: No results for input(s): CHOL, HDL, LDLCALC, TRIG, CHOLHDL,  LDLDIRECT in the last 72 hours. Thyroid Function Tests: No results for input(s): TSH, T4TOTAL, FREET4, T3FREE, THYROIDAB in the last 72 hours. Anemia Panel: No results for input(s): VITAMINB12, FOLATE, FERRITIN, TIBC, IRON , RETICCTPCT in the last 72 hours. Most Recent Urinalysis On File:     Component Value Date/Time   COLORURINE AMBER (A) 04/28/2024 1103   APPEARANCEUR TURBID (A) 04/28/2024 1103   LABSPEC 1.013 04/28/2024 1103   PHURINE 6.0 04/28/2024 1103   GLUCOSEU >=500 (A) 04/28/2024 1103   HGBUR LARGE (A) 04/28/2024 1103   BILIRUBINUR NEGATIVE 04/28/2024 1103   BILIRUBINUR neg 06/14/2019 1408   KETONESUR NEGATIVE 04/28/2024 1103   PROTEINUR 100 (A) 04/28/2024 1103   UROBILINOGEN 0.2 06/14/2019 1408   UROBILINOGEN 1.0 10/19/2012 1030   NITRITE POSITIVE (A) 04/28/2024 1103   LEUKOCYTESUR MODERATE (A) 04/28/2024 1103   Sepsis Labs: @LABRCNTIP (procalcitonin:4,lacticidven:4) Microbiology: Recent Results (from the past 240 hours)  Resp panel by RT-PCR (RSV, Flu A&B, Covid) Anterior Nasal Swab     Status: None   Collection Time: 04/28/24  8:30 AM   Specimen: Anterior Nasal Swab  Result Value Ref Range Status   SARS Coronavirus 2 by RT PCR NEGATIVE NEGATIVE Final    Comment: (NOTE) SARS-CoV-2 target nucleic acids are NOT DETECTED.  The SARS-CoV-2 RNA is generally detectable in upper respiratory specimens during the acute phase of infection. The lowest concentration of SARS-CoV-2 viral copies this assay can detect is 138 copies/mL. A negative result does not preclude SARS-Cov-2 infection and should not be used as the sole basis for treatment or other patient management decisions. A negative result may occur with  improper specimen collection/handling, submission of specimen other than nasopharyngeal swab, presence of viral mutation(s) within the areas targeted by this assay, and inadequate number of viral copies(<138 copies/mL). A negative result must  be combined with clinical observations, patient history, and epidemiological information. The expected result is Negative.  Fact Sheet for Patients:  bloggercourse.com  Fact Sheet for Healthcare Providers:  seriousbroker.it  This test is no t yet approved or cleared by the [place]  FDA and  has been authorized for detection and/or diagnosis of SARS-CoV-2 by FDA under an Emergency Use Authorization (EUA). This EUA will remain  in effect (meaning this test can be used) for the duration of the COVID-19 declaration under Section 564(b)(1) of the Act, 21 U.S.C.section 360bbb-3(b)(1), unless the authorization is terminated  or revoked sooner.       Influenza A by PCR NEGATIVE NEGATIVE Final   Influenza B by PCR NEGATIVE NEGATIVE Final    Comment: (NOTE) The Xpert Xpress SARS-CoV-2/FLU/RSV plus assay is intended as an aid in the diagnosis of influenza from Nasopharyngeal swab specimens and should not be used as a sole basis for treatment. Nasal washings and aspirates are unacceptable for Xpert Xpress SARS-CoV-2/FLU/RSV testing.  Fact Sheet for Patients: bloggercourse.com  Fact Sheet for Healthcare Providers: seriousbroker.it  This test is not yet approved or cleared by the [place]  FDA and has been authorized for detection and/or diagnosis of SARS-CoV-2 by FDA under an Emergency Use Authorization (EUA). This EUA will remain in effect (meaning this test can be used) for the duration of the COVID-19 declaration under Section 564(b)(1) of the Act, 21 U.S.C. section 360bbb-3(b)(1), unless the authorization is terminated or revoked.     Resp Syncytial Virus by PCR NEGATIVE NEGATIVE Final    Comment: (NOTE) Fact Sheet for Patients: bloggercourse.com  Fact Sheet for Healthcare Providers: seriousbroker.it  This test is not  yet approved or cleared by the [place]  FDA and has been authorized for detection and/or diagnosis of SARS-CoV-2 by FDA under an Emergency Use Authorization (EUA). This EUA will remain in effect (meaning this test can be used) for the duration of the COVID-19 declaration under Section 564(b)(1) of the Act, 21 U.S.C. section 360bbb-3(b)(1), unless the authorization is terminated or revoked.  Performed at [facility], [address] [place], [place] [address]   Blood Culture (routine x 2)     Status: Abnormal (Preliminary result)   Collection Time: 04/28/24  8:38 AM   Specimen: BLOOD  Result Value Ref Range Status   Specimen Description   Final    BLOOD BLOOD LEFT ARM Performed at [facility], [address], [place], [place] [address]    Special Requests   Final    BOTTLES DRAWN AEROBIC AND ANAEROBIC Blood Culture adequate volume Performed at [facility], [address] [place], [place] [address]    Culture  Setup Time   Final    IN BOTH AEROBIC AND ANAEROBIC BOTTLES GRAM NEGATIVE RODS CRITICAL RESULT CALLED TO, READ BACK BY AND VERIFIED WITH: NATHAN BELUE @ 04/28/2024 2243 AB    Culture (A)  Final    ESCHERICHIA COLI SUSCEPTIBILITIES TO FOLLOW Performed at [facility], [address] [place], [place] [address]    Report Status PENDING  Incomplete  Blood Culture ID Panel (Reflexed)     Status: Abnormal   Collection Time: 04/28/24  8:38 AM  Result Value Ref Range Status   Enterococcus faecalis NOT DETECTED NOT DETECTED Final   Enterococcus Faecium NOT DETECTED NOT DETECTED Final   Listeria monocytogenes NOT DETECTED NOT DETECTED Final   Staphylococcus species NOT DETECTED NOT DETECTED Final   Staphylococcus aureus (BCID) NOT DETECTED NOT DETECTED Final   Staphylococcus epidermidis NOT DETECTED NOT DETECTED Final   Staphylococcus lugdunensis NOT DETECTED NOT DETECTED Final   Streptococcus  species NOT DETECTED NOT DETECTED Final    Streptococcus agalactiae NOT DETECTED NOT DETECTED Final   Streptococcus pneumoniae NOT DETECTED NOT DETECTED Final   Streptococcus pyogenes NOT DETECTED NOT DETECTED Final   A.calcoaceticus-baumannii NOT DETECTED NOT DETECTED Final   Bacteroides fragilis NOT DETECTED NOT DETECTED Final   Enterobacterales DETECTED (A) NOT DETECTED Final    Comment: Enterobacterales represent a large order of gram negative bacteria, not a single organism. CRITICAL RESULT CALLED TO, READ BACK BY AND VERIFIED WITH: NATHAN BELUE @ 04/28/2024 2243 AB    Enterobacter cloacae complex NOT DETECTED NOT DETECTED Final   Escherichia coli DETECTED (A) NOT DETECTED Final    Comment: CRITICAL RESULT CALLED TO, READ BACK BY AND VERIFIED WITH: NATHAN BELUE @ 04/28/2024 2243 AB    Klebsiella aerogenes NOT DETECTED NOT DETECTED Final   Klebsiella oxytoca NOT DETECTED NOT DETECTED Final   Klebsiella pneumoniae NOT DETECTED NOT DETECTED Final   Proteus species NOT DETECTED NOT DETECTED Final   Salmonella species NOT DETECTED NOT DETECTED Final   Serratia marcescens NOT DETECTED NOT DETECTED Final   Haemophilus influenzae NOT DETECTED NOT DETECTED Final   Neisseria meningitidis NOT DETECTED NOT DETECTED Final   Pseudomonas aeruginosa NOT DETECTED NOT DETECTED Final   Stenotrophomonas maltophilia NOT DETECTED NOT DETECTED Final   Candida albicans NOT DETECTED NOT DETECTED Final   Candida auris NOT DETECTED NOT DETECTED Final   Candida glabrata NOT DETECTED NOT DETECTED Final   Candida krusei NOT DETECTED NOT DETECTED Final   Candida parapsilosis NOT DETECTED NOT DETECTED Final   Candida tropicalis NOT DETECTED NOT DETECTED Final   Cryptococcus neoformans/gattii NOT DETECTED NOT DETECTED Final   CTX-M ESBL NOT DETECTED NOT DETECTED Final   Carbapenem resistance IMP NOT DETECTED NOT DETECTED Final   Carbapenem resistance KPC NOT DETECTED NOT DETECTED Final   Carbapenem resistance NDM NOT DETECTED NOT DETECTED Final    Carbapenem resist OXA 48 LIKE NOT DETECTED NOT DETECTED Final   Carbapenem resistance VIM NOT DETECTED NOT DETECTED Final    Comment: Performed at [facility], [address] [place], [place] [address]  Urine Culture (for pregnant, neutropenic or urologic patients or patients with an indwelling urinary catheter)     Status: Abnormal   Collection Time: 04/28/24 11:03 AM   Specimen: Urine, Clean Catch  Result Value Ref Range Status   Specimen Description   Final    URINE, CLEAN CATCH Performed at [facility], [address] [place], [place] [address]    Special Requests   Final    NONE Performed at [facility], [address] [place], [place] [address]    Culture 50,000 COLONIES/mL ESCHERICHIA COLI (A)  Final   Report Status 04/30/2024 FINAL  Final   Organism ID, Bacteria ESCHERICHIA COLI (A)  Final      Susceptibility   Escherichia coli - MIC*    AMPICILLIN >=32 RESISTANT Resistant     CEFAZOLIN (URINE) Value in next row Sensitive      8 SENSITIVEThis is a modified FDA-approved test that has been validated and its performance characteristics determined by the reporting laboratory.  This laboratory is certified under the Clinical Laboratory Improvement Amendments CLIA as qualified to perform high complexity clinical laboratory testing.    CEFEPIME  Value in next row Sensitive      8 SENSITIVEThis is a modified FDA-approved test that has been validated and its performance characteristics determined by the reporting laboratory.  This laboratory is certified under the  Clinical Laboratory Improvement Amendments CLIA as qualified to perform high complexity clinical laboratory testing.    ERTAPENEM Value in next row Sensitive      8 SENSITIVEThis is a modified FDA-approved test that has been validated and its performance characteristics determined by the reporting laboratory.  This laboratory is certified under the Clinical Laboratory Improvement Amendments CLIA  as qualified to perform high complexity clinical laboratory testing.    CEFTRIAXONE  Value in next row Sensitive      8 SENSITIVEThis is a modified FDA-approved test that has been validated and its performance characteristics determined by the reporting laboratory.  This laboratory is certified under the Clinical Laboratory Improvement Amendments CLIA as qualified to perform high complexity clinical laboratory testing.    CIPROFLOXACIN  Value in next row Sensitive      8 SENSITIVEThis is a modified FDA-approved test that has been validated and its performance characteristics determined by the reporting laboratory.  This laboratory is certified under the Clinical Laboratory Improvement Amendments CLIA as qualified to perform high complexity clinical laboratory testing.    GENTAMICIN Value in next row Sensitive      8 SENSITIVEThis is a modified FDA-approved test that has been validated and its performance characteristics determined by the reporting laboratory.  This laboratory is certified under the Clinical Laboratory Improvement Amendments CLIA as qualified to perform high complexity clinical laboratory testing.    NITROFURANTOIN Value in next row Sensitive      8 SENSITIVEThis is a modified FDA-approved test that has been validated and its performance characteristics determined by the reporting laboratory.  This laboratory is certified under the Clinical Laboratory Improvement Amendments CLIA as qualified to perform high complexity clinical laboratory testing.    TRIMETH /SULFA  Value in next row Sensitive      8 SENSITIVEThis is a modified FDA-approved test that has been validated and its performance characteristics determined by the reporting laboratory.  This laboratory is certified under the Clinical Laboratory Improvement Amendments CLIA as qualified to perform high complexity clinical laboratory testing.    AMPICILLIN/SULBACTAM Value in next row Intermediate      8 SENSITIVEThis is a modified  FDA-approved test that has been validated and its performance characteristics determined by the reporting laboratory.  This laboratory is certified under the Clinical Laboratory Improvement Amendments CLIA as qualified to perform high complexity clinical laboratory testing.    PIP/TAZO Value in next row Sensitive      <=4 SENSITIVEThis is a modified FDA-approved test that has been validated and its performance characteristics determined by the reporting laboratory.  This laboratory is certified under the Clinical Laboratory Improvement Amendments CLIA as qualified to perform high complexity clinical laboratory testing.    MEROPENEM Value in next row Sensitive      <=4 SENSITIVEThis is a modified FDA-approved test that has been validated and its performance characteristics determined by the reporting laboratory.  This laboratory is certified under the Clinical Laboratory Improvement Amendments CLIA as qualified to perform high complexity clinical laboratory testing.    * 50,000 COLONIES/mL ESCHERICHIA COLI  Blood Culture (routine x 2)     Status: Abnormal (Preliminary result)   Collection Time: 04/28/24  1:46 PM   Specimen: BLOOD LEFT WRIST  Result Value Ref Range Status   Specimen Description   Final    BLOOD LEFT WRIST Performed at [facility], [address] [place], [place] [address]    Special Requests   Final    BOTTLES DRAWN AEROBIC AND ANAEROBIC Blood Culture adequate volume Performed  at [facility], [address] [place], [place] [address]    Culture  Setup Time (A)  Final    YEAST AEROBIC BOTTLE ONLY CRITICAL RESULT CALLED TO, READ BACK BY AND VERIFIED WITH: NATHAN BELUE @ 04/30/2024 0216 AB Performed at [facility], [address], [place], [place] [address]    Culture YEAST (A)  Final   Report Status PENDING  Incomplete  Blood Culture ID Panel (Reflexed)     Status: Abnormal   Collection Time: 04/28/24  1:46 PM  Result Value Ref Range Status    Enterococcus faecalis NOT DETECTED NOT DETECTED Final   Enterococcus Faecium NOT DETECTED NOT DETECTED Final   Listeria monocytogenes NOT DETECTED NOT DETECTED Final   Staphylococcus species NOT DETECTED NOT DETECTED Final   Staphylococcus aureus (BCID) NOT DETECTED NOT DETECTED Final   Staphylococcus epidermidis NOT DETECTED NOT DETECTED Final   Staphylococcus lugdunensis NOT DETECTED NOT DETECTED Final   Streptococcus species NOT DETECTED NOT DETECTED Final   Streptococcus agalactiae NOT DETECTED NOT DETECTED Final   Streptococcus pneumoniae NOT DETECTED NOT DETECTED Final   Streptococcus pyogenes NOT DETECTED NOT DETECTED Final   A.calcoaceticus-baumannii NOT DETECTED NOT DETECTED Final   Bacteroides fragilis NOT DETECTED NOT DETECTED Final   Enterobacterales NOT DETECTED NOT DETECTED Final   Enterobacter cloacae complex NOT DETECTED NOT DETECTED Final   Escherichia coli NOT DETECTED NOT DETECTED Final   Klebsiella aerogenes NOT DETECTED NOT DETECTED Final   Klebsiella oxytoca NOT DETECTED NOT DETECTED Final   Klebsiella pneumoniae NOT DETECTED NOT DETECTED Final   Proteus species NOT DETECTED NOT DETECTED Final   Salmonella species NOT DETECTED NOT DETECTED Final   Serratia marcescens NOT DETECTED NOT DETECTED Final   Haemophilus influenzae NOT DETECTED NOT DETECTED Final   Neisseria meningitidis NOT DETECTED NOT DETECTED Final   Pseudomonas aeruginosa NOT DETECTED NOT DETECTED Final   Stenotrophomonas maltophilia NOT DETECTED NOT DETECTED Final   Candida albicans DETECTED (A) NOT DETECTED Final    Comment: CRITICAL RESULT CALLED TO, READ BACK BY AND VERIFIED WITH: NATHAN BELUE @ 04/30/2024 0216 AB    Candida auris NOT DETECTED NOT DETECTED Final   Candida glabrata DETECTED (A) NOT DETECTED Final    Comment: CRITICAL RESULT CALLED TO, READ BACK BY AND VERIFIED WITH: NATHAN BELUE @ 04/30/2024 0216 AB    Candida krusei NOT DETECTED NOT DETECTED Final   Candida parapsilosis NOT  DETECTED NOT DETECTED Final   Candida tropicalis NOT DETECTED NOT DETECTED Final   Cryptococcus neoformans/gattii NOT DETECTED NOT DETECTED Final    Comment: Performed at [facility], [address], [place], [place] [address]      Radiology Studies last 3 days: US  Abdomen Limited RUQ (LIVER/GB) Result Date: 04/28/2024 EXAM: Right Upper Quadrant Abdominal Ultrasound 04/28/2024 02:34:52 PM TECHNIQUE: Real-time ultrasonography of the right upper quadrant of the abdomen was performed. COMPARISON: CT abdomen and pelvis 04/28/2024. CLINICAL HISTORY: Elevated LFTs. FINDINGS: LIVER: The liver demonstrates normal echogenicity. No intrahepatic biliary ductal dilatation. No evidence of mass. BILIARY SYSTEM: No pericholecystic fluid or wall thickening. No cholelithiasis. Common bile duct is within normal limits measuring 8 mm. OTHER: No right upper quadrant ascites. IMPRESSION: 1. No acute findings. 2. Common bile duct measures 8 mm which is within normal limits . Electronically signed by: Greig Pique MD 04/28/2024 10:02 PM EST RP Workstation: [id number]   CT LUMBAR SPINE WO CONTRAST Result Date: 04/28/2024 EXAM: CT THORACIC AND LUMBAR SPINE WITHOUT INTRAVENOUS CONTRAST 04/28/2024 05:08:00 PM  TECHNIQUE: CT of the thoracic and lumbar spine was performed without the administration of intravenous contrast. Multiplanar reformatted images are provided for review. Automated exposure control, iterative reconstruction, and/or weight based adjustment of the mA/kV was utilized to reduce the radiation dose to as low as reasonably achievable. Incidental adrenal and/or renal findings do not require follow up imaging. COMPARISON: Thoracic and lumbar spine radiographs 04/28/2024. CT abdomen and pelvis 04/28/2024. CTA chest 09/04/2019. CLINICAL HISTORY: fall FINDINGS: THORACIC SPINE: BONES AND ALIGNMENT: Exaggerated upper thoracic kyphosis. Mild upper thoracic levoscoliosis and mid to lower thoracic dextroscoliosis.  No significant listhesis. Oblique nondisplaced fracture through the anterior aspect of the T5 vertebral body and superior endplate with involvement of anterior and right lateral osteophytes, new from 2021 but of indeterminate age as some margins appear more sclerotic/chronic while others are less well defined. No definite associated middle or posterior column fracture. Chronic mild T12 superior endplate compression fracture, unchanged from 2021. Ankylosis by bridging vertebral osteophytes throughout the thoracic spine. Multilevel facet ankylosis as well. DEGENERATIVE CHANGES: No high-grade spinal canal stenosis. SOFT TISSUES: No acute abnormality. Aortic and coronary atherosclerosis. LUMBAR SPINE: BONES AND ALIGNMENT: 5 lumbar type vertebrae. Trace anterolisthesis of L4 and L5. Horizontal fracture through the superior half of the L1 vertebral body with 7 mm of distraction and with a nondisplaced fracture extending through the T12 posterior elements bilaterally. Some of the fracture margins are mildly sclerotic, and this may be subacute. Widespread posterior element ankylosis in the lumbar spine. DEGENERATIVE CHANGES: At least mild spinal stenosis and mild to moderate left neural foraminal stenosis at L4-L5 due to disc bulging, endplate spurring, and severe facet hypertrophy. SOFT TISSUES: Likely mild paravertebral soft tissue swelling at L1. IMPRESSION: 1. Horizontal fracture through the superior half of the L1 vertebral body and T12 posterior elements, possibly subacute. 2. Oblique nondisplaced fracture through the anterior aspect of the T5 vertebral body, new from 2021 but of indeterminate age. 3. Widespread thoracic and lumbar ankylosis 4. Chronic T12 compression fracture. Electronically signed by: Dasie Hamburg MD 04/28/2024 06:37 PM EST RP Workstation: [id number]   CT THORACIC SPINE WO CONTRAST Result Date: 04/28/2024 EXAM: CT THORACIC AND LUMBAR SPINE WITHOUT INTRAVENOUS CONTRAST 04/28/2024 05:08:00 PM  TECHNIQUE: CT of the thoracic and lumbar spine was performed without the administration of intravenous contrast. Multiplanar reformatted images are provided for review. Automated exposure control, iterative reconstruction, and/or weight based adjustment of the mA/kV was utilized to reduce the radiation dose to as low as reasonably achievable. Incidental adrenal and/or renal findings do not require follow up imaging. COMPARISON: Thoracic and lumbar spine radiographs 04/28/2024. CT abdomen and pelvis 04/28/2024. CTA chest 09/04/2019. CLINICAL HISTORY: fall FINDINGS: THORACIC SPINE: BONES AND ALIGNMENT: Exaggerated upper thoracic kyphosis. Mild upper thoracic levoscoliosis and mid to lower thoracic dextroscoliosis. No significant listhesis. Oblique nondisplaced fracture through the anterior aspect of the T5 vertebral body and superior endplate with involvement of anterior and right lateral osteophytes, new from 2021 but of indeterminate age as some margins appear more sclerotic/chronic while others are less well defined. No definite associated middle or posterior column fracture. Chronic mild T12 superior endplate compression fracture, unchanged from 2021. Ankylosis by bridging vertebral osteophytes throughout the thoracic spine. Multilevel facet ankylosis as well. DEGENERATIVE CHANGES: No high-grade spinal canal stenosis. SOFT TISSUES: No acute abnormality. Aortic and coronary atherosclerosis. LUMBAR SPINE: BONES AND ALIGNMENT: 5 lumbar type vertebrae. Trace anterolisthesis of L4 and L5. Horizontal fracture through the superior half of the L1 vertebral body with 7 mm of distraction and  with a nondisplaced fracture extending through the T12 posterior elements bilaterally. Some of the fracture margins are mildly sclerotic, and this may be subacute. Widespread posterior element ankylosis in the lumbar spine. DEGENERATIVE CHANGES: At least mild spinal stenosis and mild to moderate left neural foraminal stenosis at L4-L5  due to disc bulging, endplate spurring, and severe facet hypertrophy. SOFT TISSUES: Likely mild paravertebral soft tissue swelling at L1. IMPRESSION: 1. Horizontal fracture through the superior half of the L1 vertebral body and T12 posterior elements, possibly subacute. 2. Oblique nondisplaced fracture through the anterior aspect of the T5 vertebral body, new from 2021 but of indeterminate age. 3. Widespread thoracic and lumbar ankylosis 4. Chronic T12 compression fracture. Electronically signed by: Dasie Hamburg MD 04/28/2024 06:37 PM EST RP Workstation: [id number]   CT CERVICAL SPINE WO CONTRAST Result Date: 04/28/2024 EXAM: CT CERVICAL SPINE WITHOUT CONTRAST 04/28/2024 05:08:00 PM TECHNIQUE: CT of the cervical spine was performed without the administration of intravenous contrast. Multiplanar reformatted images are provided for review. Automated exposure control, iterative reconstruction, and/or weight based adjustment of the mA/kV was utilized to reduce the radiation dose to as low as reasonably achievable. COMPARISON: CT cervical spine 04/30/2023 CLINICAL HISTORY: Fall FINDINGS: CERVICAL SPINE: BONES AND ALIGNMENT: Chronic straightening of the normal cervical lordosis. Slight left convex cervical curvature near the cervicothoracic junction. No significant listhesis. No acute fracture or suspicious lesion. DEGENERATIVE CHANGES: Spondylosis with large anterior vertebral osteophytes throughout the majority of the cervical spine. Advanced multilevel facet arthrosis. Potentially moderate multilevel spinal stenosis. Moderate to severe bilateral neural foraminal stenosis at C3-C4. SOFT TISSUES: No prevertebral soft tissue swelling. IMPRESSION: 1. No acute cervical spine fracture or traumatic malalignment. 2. Diffuse cervical spondylosis and facet arthrosis. Electronically signed by: Dasie Hamburg MD 04/28/2024 06:13 PM EST RP Workstation: [id number]   DG Thoracic Spine 2 View Result Date: 04/28/2024 CLINICAL DATA:   Multiple fall EXAM: THORACIC SPINE 2 VIEWS COMPARISON:  CT 04/28/2024, chest CT 09/04/2019 FINDINGS: Scoliosis of the thoracic spine. Moderate superior endplate deformity at T12, probably chronic to the chest CT from 2021 but lucencies on today's CT in the region of the spinous process. Lucency at the superior aspect of L1 vertebral body. Remaining vertebra demonstrate grossly normal stature. Multilevel flowing osteophytosis. IMPRESSION: 1. Moderate superior endplate deformity at T12, probably chronic to the chest CT from 2021 2. Suspected acute fracture involving L1 and posterior elements at T12. Dedicated thoracolumbar CT recommended for further assessment Electronically Signed   By: Luke Bun M.D.   On: 04/28/2024 16:14   DG Lumbar Spine 2-3 Views Result Date: 04/28/2024 CLINICAL DATA:  Multiple fall EXAM: LUMBAR SPINE - 2-3 VIEW COMPARISON:  CT 04/28/2024 FINDINGS: Lumbar alignment within normal limits. Aortic atherosclerosis. Abnormal lucency at the L1 vertebral body with superior endplate deformity. There is also additional superior endplate deformity at T12. On sagittal reconstructions of today CT, possible linear lucency at the spinous process of T12 and left posterior arch of T12. Moderate disc space narrowing L3-L4 and L5-S1. Prominent lower lumbar facet degenerative changes. IMPRESSION: 1. Abnormal lucency at the L1 vertebral body with superior endplate deformity, suspicious for acute fracture. Additional superior endplate deformity at T12, age indeterminate. Possible linear lucency at the spinous process of T12 and left posterior arch of T12, suspicious for acute fracture. Dedicated thoracolumbar CT recommended for further assessment. 2. Moderate degenerative changes of the lower lumbar spine. Electronically Signed   By: Luke Bun M.D.   On: 04/28/2024 16:12   DG Cervical Spine 2  or 3 views Result Date: 04/28/2024 CLINICAL DATA:  Multiple fall EXAM: CERVICAL SPINE - 2-3 VIEW COMPARISON:   04/12/2011, CT 01/28/2023 FINDINGS: Straightening of the cervical spine. Normal prevertebral soft tissue thickness. Dens and lateral masses are grossly within normal limits allowing for suboptimal positioning. Moderate diffuse disc space narrowing C3 through C7 with bulky multilevel osteophytes. IMPRESSION: Straightening of the cervical spine with moderate diffuse degenerative changes. Cross-sectional imaging follow-up if persistent concern for cervical fracture Electronically Signed   By: Luke Bun M.D.   On: 04/28/2024 16:05   DG HIPS BILAT WITH PELVIS MIN 5 VIEWS Result Date: 04/28/2024 CLINICAL DATA:  Recent fall concern for fracture EXAM: DG HIP (WITH OR WITHOUT PELVIS) 5+V BILAT COMPARISON:  CT 04/28/2024 FINDINGS: SI joints are non widened. Pubic symphysis appears intact. No definitive fracture or malalignment. Prominent acetabular osteophytes. Mild bilateral hip degenerative change IMPRESSION: No acute osseous abnormality. Cross-sectional imaging follow-up if persistent concern for hip or pelvic fracture Electronically Signed   By: Luke Bun M.D.   On: 04/28/2024 16:03   CT HEAD WO CONTRAST ( ) Result Date: 04/28/2024 EXAM: CT HEAD WITHOUT 04/28/2024 03:06:26 PM TECHNIQUE: CT of the head was performed without the administration of intravenous contrast. Automated exposure control, iterative reconstruction, and/or weight based adjustment of the mA/kV was utilized to reduce the radiation dose to as low as reasonably achievable. COMPARISON: head CT 04/30/2023 CLINICAL HISTORY: Mental status change, unknown cause FINDINGS: BRAIN AND VENTRICLES: There is no evidence of an acute infarct, intracranial hemorrhage, mass, midline shift, hydrocephalus, or extra-axial fluid collection. Cerebral volume is normal for age. Cerebral white matter hypodensities are nonspecific but compatible with mild chronic small vessel ischemic disease. Calcified atherosclerosis at the skull base. ORBITS: Bilateral  cataract extraction. SINUSES AND MASTOIDS: No acute abnormality. SOFT TISSUES AND SKULL: No acute skull fracture. No acute soft tissue abnormality. IMPRESSION: 1. No acute intracranial abnormality. 2. Mild chronic small vessel ischemic disease. Electronically signed by: Dasie Hamburg MD 04/28/2024 03:12 PM EST RP Workstation: [id number]   CT ABDOMEN PELVIS WO CONTRAST Result Date: 04/28/2024 CLINICAL DATA:  Diffuse abdominal pain.  Sepsis. EXAM: CT ABDOMEN AND PELVIS WITHOUT CONTRAST TECHNIQUE: Multidetector CT imaging of the abdomen and pelvis was performed following the standard protocol without IV contrast. RADIATION DOSE REDUCTION: This exam was performed according to the departmental dose-optimization program which includes automated exposure control, adjustment of the mA and/or kV according to patient size and/or use of iterative reconstruction technique. COMPARISON:  10/10/2010 FINDINGS: Lower chest: No acute findings. Hepatobiliary: No mass visualized on this unenhanced exam. Prior cholecystectomy. No evidence of biliary obstruction. Pancreas: No mass or inflammatory process visualized on this unenhanced exam. Spleen:  Within normal limits in size. Adrenals/Urinary tract: No evidence of urolithiasis. Mild left renal pyelectasis and ureterectasis is seen with gas in the renal collecting systems, most likely due to vesicoureteral reflux. Small amount of gas also seen in urinary bladder, likely from recent instrumentation. Stomach/Bowel: No evidence of small bowel obstruction, inflammatory process, or abnormal fluid collections. Diffuse gaseous distention of colon is seen with moderate stool burden is, which may be due to ileus or constipation. Vascular/Lymphatic: No pathologically enlarged lymph nodes identified. No evidence of abdominal aortic aneurysm. Reproductive: Prior hysterectomy noted. Adnexal regions are unremarkable in appearance. Other:  None. Musculoskeletal:  No suspicious bone lesions  identified. IMPRESSION: Mild left renal pyelectasis and ureterectasis with gas in the renal collecting system, most likely due to vesicoureteral reflux. No evidence of ureteral calculus. Gas in urinary bladder.  Recommend clinical correlation for recent instrumentation. Diffuse gaseous distention of colon with moderate stool burden, which may be due to ileus or constipation. Electronically Signed   By: Norleen DELENA Kil M.D.   On: 04/28/2024 11:15   DG Chest Port 1 View Result Date: 04/28/2024 CLINICAL DATA:  Questionable sepsis.  Tachycardia. EXAM: PORTABLE CHEST 1 VIEW COMPARISON:  04/30/2023 FINDINGS: Rotated film. The cardio pericardial silhouette is enlarged. Atelectasis or infiltrate noted at the right base. Soft tissue fullness noted in the right hilar region with volume loss noted right hemithorax, likely accentuated by rotation. Bones are diffusely demineralized with degenerative changes in each shoulder. IMPRESSION: 1. Atelectasis or infiltrate at the right base. 2. Soft tissue fullness in the right hilar region with volume loss in the right hemithorax, likely accentuated by rotation. Dedicated upright PA and lateral chest x-ray recommended when patient is able. Electronically Signed   By: Camellia Candle M.D.   On: 04/28/2024 08:55         Jashley Yellin, DO [facility] 04/30/2024, 5:25 PM    Dictation software may have been used to generate the above note. Typos may occur and escape review in typed/dictated notes. Please contact Dr Marsa directly for clarity if needed.  Staff may message me via secure chat in Epic  but this may not receive an immediate response,  please page me for urgent matters!  If 7PM-7AM, please contact night coverage [email or web address]

## 2024-04-30 NOTE — Plan of Care (Signed)
°  Problem: Education: Goal: Ability to describe self-care measures that may prevent or decrease complications (Diabetes Survival Skills Education) will improve Outcome: Not Progressing   Problem: Education: Goal: Individualized Educational Video(s) Outcome: Not Progressing   Problem: Coping: Goal: Ability to adjust to condition or change in health will improve Outcome: Not Progressing   Problem: Fluid Volume: Goal: Ability to maintain a balanced intake and output will improve Outcome: Not Progressing

## 2024-05-01 ENCOUNTER — Telehealth: Payer: Self-pay

## 2024-05-01 DIAGNOSIS — B9620 Unspecified Escherichia coli [E. coli] as the cause of diseases classified elsewhere: Secondary | ICD-10-CM

## 2024-05-01 DIAGNOSIS — N390 Urinary tract infection, site not specified: Secondary | ICD-10-CM | POA: Diagnosis not present

## 2024-05-01 DIAGNOSIS — B3749 Other urogenital candidiasis: Secondary | ICD-10-CM

## 2024-05-01 DIAGNOSIS — A4150 Gram-negative sepsis, unspecified: Secondary | ICD-10-CM | POA: Diagnosis not present

## 2024-05-01 LAB — GLUCOSE, CAPILLARY
Glucose-Capillary: 93 mg/dL (ref 70–99)
Glucose-Capillary: 142 mg/dL — ABNORMAL HIGH (ref 70–99)
Glucose-Capillary: 106 mg/dL — ABNORMAL HIGH (ref 70–99)

## 2024-05-01 LAB — BASIC METABOLIC PANEL WITH GFR
Anion gap: 14 (ref 5–15)
BUN: 56 mg/dL — ABNORMAL HIGH (ref 8–23)
CO2: 17 mmol/L — ABNORMAL LOW (ref 22–32)
Calcium: 8.3 mg/dL — ABNORMAL LOW (ref 8.9–10.3)
Chloride: 103 mmol/L (ref 98–111)
Creatinine, Ser: 1.96 mg/dL — ABNORMAL HIGH (ref 0.44–1.00)
GFR, Estimated: 25 mL/min — ABNORMAL LOW (ref >60)
Glucose, Bld: 93 mg/dL (ref 70–99)
Potassium: 3.6 mmol/L (ref 3.5–5.1)
Sodium: 134 mmol/L — ABNORMAL LOW (ref 135–145)

## 2024-05-01 LAB — CULTURE, BLOOD (ROUTINE X 2): Special Requests: ADEQUATE

## 2024-05-01 LAB — CBC
HCT: 33 % — ABNORMAL LOW (ref 36.0–46.0)
Hemoglobin: 10.9 g/dL — ABNORMAL LOW (ref 12.0–15.0)
MCH: 28.6 pg (ref 26.0–34.0)
MCHC: 33 g/dL (ref 30.0–36.0)
MCV: 86.6 fL (ref 80.0–100.0)
Platelets: 125 K/uL — ABNORMAL LOW (ref 150–400)
RBC: 3.81 MIL/uL — ABNORMAL LOW (ref 3.87–5.11)
RDW: 15.4 % (ref 11.5–15.5)
WBC: 12 K/uL — ABNORMAL HIGH (ref 4.0–10.5)
nRBC: 0.2 % (ref 0.0–0.2)

## 2024-05-01 MED ORDER — CHLORHEXIDINE GLUCONATE CLOTH 2 % EX PADS: 6.0000 | MEDICATED_PAD | Freq: Every day | CUTANEOUS | Status: DC

## 2024-05-01 NOTE — Progress Notes (Signed)
 Occupational Therapy Treatment Patient Details Name: Cynthia Singh MRN: [id number] DOB: June 28, 1940 Today's Date: 05/01/2024   History of present illness 83 y.o. female admitted with sepsis & acute metabolic encephalopathy due to UTI. Also found to have subacute vertebral fracture L1, T5, T12, no neurosurgical intervention, TLSO brace when OOB, ok to take off with shower. PMH significant for diabetes, HTN, GERD, CKD, MI, obesity, CAD, OA   OT comments  Cynthia Singh was seen for OT treatment on this date. Upon arrival to room pt in bed, agreeable to tx. Pt requires MAX A  sup>sit, MOD A sit>sup. MAX A for LB access in sitting. Tolerates ~5 min sitting EOB. MOD A lateral scoot along EOB. Pain limiting session with pt premedicated - 10/10 with movement, 6/10 at rest. Pt making good progress toward goals, will continue to follow POC. Discharge recommendation remains appropriate.       If plan is discharge home, recommend the following:  Two people to help with walking and/or transfers;Two people to help with bathing/dressing/bathroom   Equipment Recommendations  Other (comment) (defer)    Recommendations for Other Services      Precautions / Restrictions Precautions Precautions: Fall;Back Recall of Precautions/Restrictions: Impaired Required Braces or Orthoses: Spinal Brace Spinal Brace: Thoracolumbosacral orthotic Restrictions Weight Bearing Restrictions Per Provider Order: No       Mobility Bed Mobility Overal bed mobility: Needs Assistance Bed Mobility: Rolling, Sidelying to Sit, Sit to Supine Rolling: Max assist Sidelying to sit: Max assist   Sit to supine: Mod assist        Transfers Overall transfer level: Needs assistance   Transfers: Bed to chair/wheelchair/BSC            Lateral/Scoot Transfers: Mod assist General transfer comment: along EOB     Balance Overall balance assessment: Needs assistance Sitting-balance support: Bilateral upper extremity  supported, Feet supported Sitting balance-Leahy Scale: Fair                                     ADL either performed or assessed with clinical judgement   ADL Overall ADL's : Needs assistance/impaired                                       General ADL Comments: MAX A for LB access in sitting     Communication Communication Communication: Impaired Factors Affecting Communication: Difficulty expressing self   Cognition Arousal: Alert Behavior During Therapy: Flat affect Cognition: No family/caregiver present to determine baseline                               Following commands: Impaired Following commands impaired: Follows one step commands inconsistently      Cueing   Cueing Techniques: Verbal cues, Gestural cues, Tactile cues             Pertinent Vitals/ Pain       Pain Assessment Pain Assessment: 0-10 Pain Score: 6  Pain Location: mid/low back - severe pain with movement Pain Descriptors / Indicators: Grimacing, Guarding, Discomfort Pain Intervention(s): Limited activity within patient's tolerance, Premedicated before session   Frequency  Min 2X/week        Progress Toward Goals  OT Goals(current goals can now be found in the care plan section)  Progress  towards OT goals: Progressing toward goals  Acute Rehab OT Goals OT Goal Formulation: Patient unable to participate in goal setting Time For Goal Achievement: 05/13/24 Potential to Achieve Goals: Fair ADL Goals Pt Will Perform Grooming: sitting;with set-up;with supervision Pt Will Perform Lower Body Dressing: with min assist;sit to/from stand Pt Will Transfer to Toilet: with min assist;ambulating;bedside commode  Plan      Co-evaluation                 AM-PAC OT 6 Clicks Daily Activity     Outcome Measure   Help from another person eating meals?: A Little Help from another person taking care of personal grooming?: A Little Help from another  person toileting, which includes using toliet, bedpan, or urinal?: A Lot Help from another person bathing (including washing, rinsing, drying)?: A Lot Help from another person to put on and taking off regular upper body clothing?: A Little Help from another person to put on and taking off regular lower body clothing?: A Lot 6 Click Score: 15    End of Session    OT Visit Diagnosis: Unsteadiness on feet (R26.81);Other abnormalities of gait and mobility (R26.89);Muscle weakness (generalized) (M62.81);History of falling (Z91.81)   Activity Tolerance Patient limited by pain   Patient Left in bed;with call bell/phone within reach;with nursing/sitter in room   Nurse Communication Mobility status        Time: 8493-8484 OT Time Calculation (min): 9 min  Charges: OT General Charges $OT Visit: 1 Visit OT Treatments $Self Care/Home Management : 8-22 mins  Elston Slot, M.S. OTR/L  05/01/2024, 3:53 PM  ascom [phone]

## 2024-05-01 NOTE — Plan of Care (Signed)
°  Problem: Education: Goal: Ability to describe self-care measures that may prevent or decrease complications (Diabetes Survival Skills Education) will improve Outcome: Not Progressing   Problem: Education: Goal: Individualized Educational Video(s) Outcome: Not Progressing   Problem: Coping: Goal: Ability to adjust to condition or change in health will improve Outcome: Not Progressing   Problem: Fluid Volume: Goal: Ability to maintain a balanced intake and output will improve Outcome: Not Progressing

## 2024-05-01 NOTE — Consult Note (Signed)
 Infectious Disease     Reason for Consult:Gram neg bacteremia   Referring Physician: Alexander, Natalie, DO  Date of Admission:  04/28/2024   Principal Problem:   Sepsis due to gram-negative UTI [facility]) Active Problems:   Type 2 diabetes mellitus (HCC)   Hyperlipidemia associated with type 2 diabetes mellitus (HCC)   CAD (coronary artery disease)   GERD (gastroesophageal reflux disease)   Stage 3a chronic kidney disease (HCC)   HPI: Cynthia Singh is a 83 y.o. female admitted December 12 with nausea vomiting diarrhea and weakness.  She has a history of hypertension, hyperlipidemia type 2 diabetes and chronic kidney disease.  On admission she had a white count of 30,000, creatinine 3.27 mildly elevated LFTs lactate of 2.8.  Negative COVID flu RSV, acute hepatitis serologies negative.  Urinalysis had greater than 50 white cells with white blood cell clumps.  Blood cultures are growing E. coli sensitive to ceftriaxone  ciprofloxacin  Bactrim  other agents.  Second set of blood cultures is growing yeast with [employer] identifying Candida albicans and Candida glabrata.  Urine culture grew E. coli.  She has had imaging including CT abdomen and pelvis December 12 that showed mild left dilation of the pelvic cysts renal system with gas in the renal collecting system but no calculus.  There was gas in the urinary bladder.  There was diffuse gaseous distention of the colon with moderate stool burden.  Ultrasound of the abdomen was normal CT of the head showed mild chronic small vessel ischemic disease.  She had a CT of her C-spine which showed no fracture and a CT of her lumbar spine with a fracture through L1 and T12.  Since admit she is afebrile.  White count is down to 12.  She has been treated with ceftriaxone .  She started micafungin  December 14.  Since admit she has been doing relatively well. Currently has foley in place.    Past Medical History:  Diagnosis Date   CAD (coronary artery disease)    a.  Remote nonobstructive disease but in 10/2012 Cath/PCI: s/p DES to RCA. b. cath 02/08/15 DES to prox LAD and balloon angioplasty of ost D1, EF 55-65%; c. 08/2019 PCI/DES prox/distal RCA (overlapping prev RCA stent). Prev placed RCA/LAD stents patent.   Concussion    SAH AFTER FALL 3/18   Depression    Diabetes mellitus, type 2 (HCC)    Dyslipidemia    Episodic mood disorder    Hypertension    Myocardial infarction (HCC)    2014   Obesity    Osteoarthritis of spine    knees also   Peripheral vascular disease, unspecified 2020   severe disease on home screening by insurance   Past Surgical History:  Procedure Laterality Date   ABDOMINAL HYSTERECTOMY     APPENDECTOMY  1959   CARDIAC CATHETERIZATION N/A 02/08/2015   Procedure: Left Heart Cath and Coronary Angiography;  Surgeon: Ozell Fell, MD;  Location: [facility] INVASIVE CV LAB;  Service: Cardiovascular;  Laterality: N/A;   CARDIAC CATHETERIZATION N/A 02/08/2015   Procedure: Coronary Stent Intervention;  Surgeon: Ozell Fell, MD;  Location: [facility] INVASIVE CV LAB;  Service: Cardiovascular;  Laterality: N/A;   CARDIAC CATHETERIZATION N/A 02/08/2015   Procedure: Intravascular Pressure Wire/FFR Study;  Surgeon: Ozell Fell, MD;  Location: [facility] INVASIVE CV LAB;  Service: Cardiovascular;  Laterality: N/A;   CATARACT EXTRACTION W/PHACO Left 11/10/2016   Procedure: CATARACT EXTRACTION PHACO AND INTRAOCULAR LENS PLACEMENT (IOC) suture placed in left eye at end of procedure;  Surgeon:  Jaye Fallow, MD;  Location: [facility] ORS;  Service: Ophthalmology;  Laterality: Left;  US   3.20 AP% 27.7 CDE 55.63 Fluid pack lot # [id number]   CHOLECYSTECTOMY     COMBINED HYSTERECTOMY ABDOMINAL W/ A&P REPAIR / OOPHORECTOMY  1968   CORONARY ANGIOPLASTY     STENTS X 5   CORONARY STENT INTERVENTION N/A 09/19/2019   Procedure: CORONARY STENT INTERVENTION;  Surgeon: Mady Bruckner, MD;  Location: [facility] INVASIVE CV LAB;  Service: Cardiovascular;  Laterality: N/A;   CORONARY  STENT PLACEMENT  2014   DILATION AND CURETTAGE OF UTERUS     GALLBLADDER SURGERY  1968   LEFT HEART CATH AND CORONARY ANGIOGRAPHY Left 09/19/2019   Procedure: LEFT HEART CATH AND CORONARY ANGIOGRAPHY;  Surgeon: Mady Bruckner, MD;  Location: [facility] INVASIVE CV LAB;  Service: Cardiovascular;  Laterality: Left;   LEFT HEART CATHETERIZATION WITH CORONARY ANGIOGRAM N/A 10/20/2012   Procedure: LEFT HEART CATHETERIZATION WITH CORONARY ANGIOGRAM;  Surgeon: Ozell Fell, MD;  Location: [facility] CATH LAB;  Service: Cardiovascular;  Laterality: N/A;   multiple D&C     TONSILLECTOMY  age 16   Social History[1] Family History  Adopted: Yes  Problem Relation Age of Onset   Cancer Brother        Colon   Cancer Son    Coronary artery disease Neg Hx    Heart attack Neg Hx     Allergies: Allergies[2]  Current antibiotics: Antibiotics Given (last 72 hours)     Date/Time Action Medication Dose Rate   04/28/24 1623 New Bag/Given   cefTRIAXone  (ROCEPHIN ) 2 g in sodium chloride  0.9 % 100 mL IVPB 2 g 200 mL/hr   04/29/24 1518 New Bag/Given   cefTRIAXone  (ROCEPHIN ) 2 g in sodium chloride  0.9 % 100 mL IVPB 2 g 200 mL/hr   04/30/24 1600 New Bag/Given   cefTRIAXone  (ROCEPHIN ) 2 g in sodium chloride  0.9 % 100 mL IVPB 2 g 200 mL/hr       MEDICATIONS:  amLODipine   2.5 mg Oral Daily   aspirin  EC  81 mg Oral Daily   [START ON 05/02/2024] Chlorhexidine  Gluconate Cloth  6 each Topical Q0600   clopidogrel   75 mg Oral Daily   heparin   5,000 Units Subcutaneous Q8H   insulin  aspart  0-6 Units Subcutaneous TID WC   nortriptyline   150 mg Oral QHS   sodium chloride  flush  3 mL Intravenous Q12H    Review of Systems - 11 systems reviewed and negative per HPI   OBJECTIVE: Temp:  [97.7 F (36.5 C)-99.6 F (37.6 C)] 97.8 F (36.6 C) (12/15 1157) Pulse Rate:  [85-96] 85 (12/15 1157) Resp:  [15-20] 15 (12/15 0755) BP: (122-162)/(65-80) 139/65 (12/15 1157) SpO2:  [64 %-97 %] 97 % (12/15 1157) Weight:  [77.3 kg]  77.3 kg (12/15 0417) Physical Exam  Constitutional: frail, sitting on edge of bed HENT: [place]/AT, PERRLA, no scleral icterus Mouth/Throat: Oropharynx is clear Cardiovascular: Normal rate, regular rhythm and normal heart sounds. No murmur heard.  Pulmonary/Chest: Effort normal and breath sounds normal. No respiratory distress.  has no wheezes.  Neck = supple, no nuchal rigidity Abdominal: Soft. Bowel sounds are normal.  exhibits no distension. There is no tenderness.  Foley with cloudy urine Lymphadenopathy: no cervical adenopathy. No axillary adenopathy Neurological: awake but slowed mentation  Skin: Skin is warm and dry. No rash noted. No erythema.   LABS: Results for orders placed or performed during the hospital encounter of 04/28/24 (from the past 48 hours)  Glucose, capillary  Status: Abnormal   Collection Time: 04/29/24  5:15 PM  Result Value Ref Range   Glucose-Capillary 141 (H) 70 - 99 mg/dL    Comment: Glucose reference range applies only to samples taken after fasting for at least 8 hours.  Glucose, capillary     Status: None   Collection Time: 04/29/24 10:51 PM  Result Value Ref Range   Glucose-Capillary 93 70 - 99 mg/dL    Comment: Glucose reference range applies only to samples taken after fasting for at least 8 hours.  Hepatic function panel     Status: Abnormal   Collection Time: 04/30/24  3:45 AM  Result Value Ref Range   Total Protein 6.3 (L) 6.5 - 8.1 g/dL   Albumin 2.6 (L) 3.5 - 5.0 g/dL   AST 20 15 - 41 U/L    Comment: HEMOLYSIS AT THIS LEVEL MAY AFFECT RESULT   ALT 24 0 - 44 U/L   Alkaline Phosphatase 165 (H) 38 - 126 U/L   Total Bilirubin 0.3 0.0 - 1.2 mg/dL   Bilirubin, Direct <9.8 0.0 - 0.2 mg/dL    Comment: HEMOLYSIS AT THIS LEVEL MAY AFFECT RESULT   Indirect Bilirubin NOT CALCULATED 0.3 - 0.9 mg/dL    Comment: Performed at [facility], [address] [place], [place] [address]  Basic metabolic panel with GFR     Status: Abnormal    Collection Time: 04/30/24  3:45 AM  Result Value Ref Range   Sodium 140 135 - 145 mmol/L   Potassium 5.0 3.5 - 5.1 mmol/L    Comment: HEMOLYSIS AT THIS LEVEL MAY AFFECT RESULT   Chloride 109 98 - 111 mmol/L   CO2 18 (L) 22 - 32 mmol/L   Glucose, Bld 87 70 - 99 mg/dL    Comment: Glucose reference range applies only to samples taken after fasting for at least 8 hours.   BUN 62 (H) 8 - 23 mg/dL   Creatinine, Ser 7.61 (H) 0.44 - 1.00 mg/dL   Calcium  8.6 (L) 8.9 - 10.3 mg/dL   GFR, Estimated 20 (L) >60 mL/min    Comment: (NOTE) Calculated using the CKD-EPI Creatinine Equation (2021)    Anion gap 13 5 - 15    Comment: Performed at [facility], [address] [place], [place] [address]  CBC     Status: Abnormal   Collection Time: 04/30/24  3:45 AM  Result Value Ref Range   WBC 18.5 (H) 4.0 - 10.5 K/uL   RBC 3.88 3.87 - 5.11 MIL/uL   Hemoglobin 11.2 (L) 12.0 - 15.0 g/dL   HCT 64.7 (L) 63.9 - 53.9 %   MCV 90.7 80.0 - 100.0 fL   MCH 28.9 26.0 - 34.0 pg   MCHC 31.8 30.0 - 36.0 g/dL   RDW 84.0 (H) 88.4 - 84.4 %   Platelets 132 (L) 150 - 400 K/uL   nRBC 0.0 0.0 - 0.2 %    Comment: Performed at [facility], [address] [place], [place] [address]  Glucose, capillary     Status: None   Collection Time: 04/30/24  8:30 AM  Result Value Ref Range   Glucose-Capillary 87 70 - 99 mg/dL    Comment: Glucose reference range applies only to samples taken after fasting for at least 8 hours.  Glucose, capillary     Status: Abnormal   Collection Time: 04/30/24 12:02 PM  Result Value Ref Range   Glucose-Capillary 105 (H) 70 - 99 mg/dL  Comment: Glucose reference range applies only to samples taken after fasting for at least 8 hours.  Glucose, capillary     Status: Abnormal   Collection Time: 04/30/24  3:56 PM  Result Value Ref Range   Glucose-Capillary 133 (H) 70 - 99 mg/dL    Comment: Glucose reference range applies only to samples taken after fasting for at least 8  hours.  Glucose, capillary     Status: None   Collection Time: 04/30/24  8:00 PM  Result Value Ref Range   Glucose-Capillary 84 70 - 99 mg/dL    Comment: Glucose reference range applies only to samples taken after fasting for at least 8 hours.  CBC     Status: Abnormal   Collection Time: 05/01/24  4:20 AM  Result Value Ref Range   WBC 12.0 (H) 4.0 - 10.5 K/uL   RBC 3.81 (L) 3.87 - 5.11 MIL/uL   Hemoglobin 10.9 (L) 12.0 - 15.0 g/dL   HCT 66.9 (L) 63.9 - 53.9 %   MCV 86.6 80.0 - 100.0 fL   MCH 28.6 26.0 - 34.0 pg   MCHC 33.0 30.0 - 36.0 g/dL   RDW 84.5 88.4 - 84.4 %   Platelets 125 (L) 150 - 400 K/uL   nRBC 0.2 0.0 - 0.2 %    Comment: Performed at [facility], [address] [place], [place] [address]  Basic metabolic panel with GFR     Status: Abnormal   Collection Time: 05/01/24  4:20 AM  Result Value Ref Range   Sodium 134 (L) 135 - 145 mmol/L   Potassium 3.6 3.5 - 5.1 mmol/L   Chloride 103 98 - 111 mmol/L   CO2 17 (L) 22 - 32 mmol/L   Glucose, Bld 93 70 - 99 mg/dL    Comment: Glucose reference range applies only to samples taken after fasting for at least 8 hours.   BUN 56 (H) 8 - 23 mg/dL   Creatinine, Ser 8.03 (H) 0.44 - 1.00 mg/dL   Calcium  8.3 (L) 8.9 - 10.3 mg/dL   GFR, Estimated 25 (L) >60 mL/min    Comment: (NOTE) Calculated using the CKD-EPI Creatinine Equation (2021)    Anion gap 14 5 - 15    Comment: Performed at [facility], [address] [place], [place] [address]  Glucose, capillary     Status: None   Collection Time: 05/01/24  7:56 AM  Result Value Ref Range   Glucose-Capillary 93 70 - 99 mg/dL    Comment: Glucose reference range applies only to samples taken after fasting for at least 8 hours.  Glucose, capillary     Status: Abnormal   Collection Time: 05/01/24 11:57 AM  Result Value Ref Range   Glucose-Capillary 142 (H) 70 - 99 mg/dL    Comment: Glucose reference range applies only to samples taken after fasting for at least 8  hours.   No components found for: ESR, C REACTIVE PROTEIN MICRO: Recent Results (from the past 720 hours)  Resp panel by RT-PCR (RSV, Flu A&B, Covid) Anterior Nasal Swab     Status: None   Collection Time: 04/28/24  8:30 AM   Specimen: Anterior Nasal Swab  Result Value Ref Range Status   SARS Coronavirus 2 by RT PCR NEGATIVE NEGATIVE Final    Comment: (NOTE) SARS-CoV-2 target nucleic acids are NOT DETECTED.  The SARS-CoV-2 RNA is generally detectable in upper respiratory specimens during the acute phase of infection. The lowest concentration of SARS-CoV-2 viral copies  this assay can detect is 138 copies/mL. A negative result does not preclude SARS-Cov-2 infection and should not be used as the sole basis for treatment or other patient management decisions. A negative result may occur with  improper specimen collection/handling, submission of specimen other than nasopharyngeal swab, presence of viral mutation(s) within the areas targeted by this assay, and inadequate number of viral copies(<138 copies/mL). A negative result must be combined with clinical observations, patient history, and epidemiological information. The expected result is Negative.  Fact Sheet for Patients:  bloggercourse.com  Fact Sheet for Healthcare Providers:  seriousbroker.it  This test is no t yet approved or cleared by the [place]  FDA and  has been authorized for detection and/or diagnosis of SARS-CoV-2 by FDA under an Emergency Use Authorization (EUA). This EUA will remain  in effect (meaning this test can be used) for the duration of the COVID-19 declaration under Section 564(b)(1) of the Act, 21 U.S.C.section 360bbb-3(b)(1), unless the authorization is terminated  or revoked sooner.       Influenza A by PCR NEGATIVE NEGATIVE Final   Influenza B by PCR NEGATIVE NEGATIVE Final    Comment: (NOTE) The Xpert Xpress SARS-CoV-2/FLU/RSV plus  assay is intended as an aid in the diagnosis of influenza from Nasopharyngeal swab specimens and should not be used as a sole basis for treatment. Nasal washings and aspirates are unacceptable for Xpert Xpress SARS-CoV-2/FLU/RSV testing.  Fact Sheet for Patients: bloggercourse.com  Fact Sheet for Healthcare Providers: seriousbroker.it  This test is not yet approved or cleared by the [place]  FDA and has been authorized for detection and/or diagnosis of SARS-CoV-2 by FDA under an Emergency Use Authorization (EUA). This EUA will remain in effect (meaning this test can be used) for the duration of the COVID-19 declaration under Section 564(b)(1) of the Act, 21 U.S.C. section 360bbb-3(b)(1), unless the authorization is terminated or revoked.     Resp Syncytial Virus by PCR NEGATIVE NEGATIVE Final    Comment: (NOTE) Fact Sheet for Patients: bloggercourse.com  Fact Sheet for Healthcare Providers: seriousbroker.it  This test is not yet approved or cleared by the [place]  FDA and has been authorized for detection and/or diagnosis of SARS-CoV-2 by FDA under an Emergency Use Authorization (EUA). This EUA will remain in effect (meaning this test can be used) for the duration of the COVID-19 declaration under Section 564(b)(1) of the Act, 21 U.S.C. section 360bbb-3(b)(1), unless the authorization is terminated or revoked.  Performed at [facility], [address] [place], [place] [address]   Blood Culture (routine x 2)     Status: Abnormal   Collection Time: 04/28/24  8:38 AM   Specimen: BLOOD  Result Value Ref Range Status   Specimen Description   Final    BLOOD BLOOD LEFT ARM Performed at [facility], [address] [place], [place] [address]    Special Requests   Final    BOTTLES DRAWN AEROBIC AND ANAEROBIC Blood Culture adequate volume Performed  at [facility], [address] [place], [place] [address]    Culture  Setup Time   Final    IN BOTH AEROBIC AND ANAEROBIC BOTTLES GRAM NEGATIVE RODS CRITICAL RESULT CALLED TO, READ BACK BY AND VERIFIED WITH: NATHAN BELUE @ 04/28/2024 2243 AB Performed at [facility], [address], [place], [place] [address]    Culture ESCHERICHIA COLI (A)  Final   Report Status 05/01/2024 FINAL  Final   Organism ID, Bacteria ESCHERICHIA COLI  Final  Susceptibility   Escherichia coli - MIC*    AMPICILLIN >=32 RESISTANT Resistant     CEFAZOLIN (NON-URINE) 8 RESISTANT Resistant     CEFEPIME  <=0.12 SENSITIVE Sensitive     ERTAPENEM <=0.12 SENSITIVE Sensitive     CEFTRIAXONE  <=0.25 SENSITIVE Sensitive     CIPROFLOXACIN  <=0.06 SENSITIVE Sensitive     GENTAMICIN <=1 SENSITIVE Sensitive     MEROPENEM <=0.25 SENSITIVE Sensitive     TRIMETH /SULFA  <=20 SENSITIVE Sensitive     AMPICILLIN/SULBACTAM >=32 RESISTANT Resistant     PIP/TAZO Value in next row Sensitive      <=4 SENSITIVEThis is a modified FDA-approved test that has been validated and its performance characteristics determined by the reporting laboratory.  This laboratory is certified under the Clinical Laboratory Improvement Amendments CLIA as qualified to perform high complexity clinical laboratory testing.    * ESCHERICHIA COLI  Blood Culture ID Panel (Reflexed)     Status: Abnormal   Collection Time: 04/28/24  8:38 AM  Result Value Ref Range Status   Enterococcus faecalis NOT DETECTED NOT DETECTED Final   Enterococcus Faecium NOT DETECTED NOT DETECTED Final   Listeria monocytogenes NOT DETECTED NOT DETECTED Final   Staphylococcus species NOT DETECTED NOT DETECTED Final   Staphylococcus aureus (BCID) NOT DETECTED NOT DETECTED Final   Staphylococcus epidermidis NOT DETECTED NOT DETECTED Final   Staphylococcus lugdunensis NOT DETECTED NOT DETECTED Final   Streptococcus species NOT DETECTED NOT DETECTED Final    Streptococcus agalactiae NOT DETECTED NOT DETECTED Final   Streptococcus pneumoniae NOT DETECTED NOT DETECTED Final   Streptococcus pyogenes NOT DETECTED NOT DETECTED Final   A.calcoaceticus-baumannii NOT DETECTED NOT DETECTED Final   Bacteroides fragilis NOT DETECTED NOT DETECTED Final   Enterobacterales DETECTED (A) NOT DETECTED Final    Comment: Enterobacterales represent a large order of gram negative bacteria, not a single organism. CRITICAL RESULT CALLED TO, READ BACK BY AND VERIFIED WITH: NATHAN BELUE @ 04/28/2024 2243 AB    Enterobacter cloacae complex NOT DETECTED NOT DETECTED Final   Escherichia coli DETECTED (A) NOT DETECTED Final    Comment: CRITICAL RESULT CALLED TO, READ BACK BY AND VERIFIED WITH: NATHAN BELUE @ 04/28/2024 2243 AB    Klebsiella aerogenes NOT DETECTED NOT DETECTED Final   Klebsiella oxytoca NOT DETECTED NOT DETECTED Final   Klebsiella pneumoniae NOT DETECTED NOT DETECTED Final   Proteus species NOT DETECTED NOT DETECTED Final   Salmonella species NOT DETECTED NOT DETECTED Final   Serratia marcescens NOT DETECTED NOT DETECTED Final   Haemophilus influenzae NOT DETECTED NOT DETECTED Final   Neisseria meningitidis NOT DETECTED NOT DETECTED Final   Pseudomonas aeruginosa NOT DETECTED NOT DETECTED Final   Stenotrophomonas maltophilia NOT DETECTED NOT DETECTED Final   Candida albicans NOT DETECTED NOT DETECTED Final   Candida auris NOT DETECTED NOT DETECTED Final   Candida glabrata NOT DETECTED NOT DETECTED Final   Candida krusei NOT DETECTED NOT DETECTED Final   Candida parapsilosis NOT DETECTED NOT DETECTED Final   Candida tropicalis NOT DETECTED NOT DETECTED Final   Cryptococcus neoformans/gattii NOT DETECTED NOT DETECTED Final   CTX-M ESBL NOT DETECTED NOT DETECTED Final   Carbapenem resistance IMP NOT DETECTED NOT DETECTED Final   Carbapenem resistance KPC NOT DETECTED NOT DETECTED Final   Carbapenem resistance NDM NOT DETECTED NOT DETECTED Final    Carbapenem resist OXA 48 LIKE NOT DETECTED NOT DETECTED Final   Carbapenem resistance VIM NOT DETECTED NOT DETECTED Final    Comment: Performed at [facility], [address] [place], [place] [address]  Urine Culture (for pregnant, neutropenic or urologic patients or patients with an indwelling urinary catheter)     Status: Abnormal   Collection Time: 04/28/24 11:03 AM   Specimen: Urine, Clean Catch  Result Value Ref Range Status   Specimen Description   Final    URINE, CLEAN CATCH Performed at [facility], [address] [place], [place] [address]    Special Requests   Final    NONE Performed at [facility], [address] [place], [place] [address]    Culture 50,000 COLONIES/mL ESCHERICHIA COLI (A)  Final   Report Status 04/30/2024 FINAL  Final   Organism ID, Bacteria ESCHERICHIA COLI (A)  Final      Susceptibility   Escherichia coli - MIC*    AMPICILLIN >=32 RESISTANT Resistant     CEFAZOLIN (URINE) Value in next row Sensitive      8 SENSITIVEThis is a modified FDA-approved test that has been validated and its performance characteristics determined by the reporting laboratory.  This laboratory is certified under the Clinical Laboratory Improvement Amendments CLIA as qualified to perform high complexity clinical laboratory testing.    CEFEPIME  Value in next row Sensitive      8 SENSITIVEThis is a modified FDA-approved test that has been validated and its performance characteristics determined by the reporting laboratory.  This laboratory is certified under the Clinical Laboratory Improvement Amendments CLIA as qualified to perform high complexity clinical laboratory testing.    ERTAPENEM Value in next row Sensitive      8 SENSITIVEThis is a modified FDA-approved test that has been validated and its performance characteristics determined by the reporting laboratory.  This laboratory is certified under the Clinical Laboratory Improvement Amendments CLIA  as qualified to perform high complexity clinical laboratory testing.    CEFTRIAXONE  Value in next row Sensitive      8 SENSITIVEThis is a modified FDA-approved test that has been validated and its performance characteristics determined by the reporting laboratory.  This laboratory is certified under the Clinical Laboratory Improvement Amendments CLIA as qualified to perform high complexity clinical laboratory testing.    CIPROFLOXACIN  Value in next row Sensitive      8 SENSITIVEThis is a modified FDA-approved test that has been validated and its performance characteristics determined by the reporting laboratory.  This laboratory is certified under the Clinical Laboratory Improvement Amendments CLIA as qualified to perform high complexity clinical laboratory testing.    GENTAMICIN Value in next row Sensitive      8 SENSITIVEThis is a modified FDA-approved test that has been validated and its performance characteristics determined by the reporting laboratory.  This laboratory is certified under the Clinical Laboratory Improvement Amendments CLIA as qualified to perform high complexity clinical laboratory testing.    NITROFURANTOIN Value in next row Sensitive      8 SENSITIVEThis is a modified FDA-approved test that has been validated and its performance characteristics determined by the reporting laboratory.  This laboratory is certified under the Clinical Laboratory Improvement Amendments CLIA as qualified to perform high complexity clinical laboratory testing.    TRIMETH /SULFA  Value in next row Sensitive      8 SENSITIVEThis is a modified FDA-approved test that has been validated and its performance characteristics determined by the reporting laboratory.  This laboratory is certified under the Clinical Laboratory Improvement Amendments CLIA as qualified to perform high complexity clinical laboratory testing.    AMPICILLIN/SULBACTAM Value in next row Intermediate  8 SENSITIVEThis is a modified  FDA-approved test that has been validated and its performance characteristics determined by the reporting laboratory.  This laboratory is certified under the Clinical Laboratory Improvement Amendments CLIA as qualified to perform high complexity clinical laboratory testing.    PIP/TAZO Value in next row Sensitive      <=4 SENSITIVEThis is a modified FDA-approved test that has been validated and its performance characteristics determined by the reporting laboratory.  This laboratory is certified under the Clinical Laboratory Improvement Amendments CLIA as qualified to perform high complexity clinical laboratory testing.    MEROPENEM Value in next row Sensitive      <=4 SENSITIVEThis is a modified FDA-approved test that has been validated and its performance characteristics determined by the reporting laboratory.  This laboratory is certified under the Clinical Laboratory Improvement Amendments CLIA as qualified to perform high complexity clinical laboratory testing.    * 50,000 COLONIES/mL ESCHERICHIA COLI  Blood Culture (routine x 2)     Status: Abnormal (Preliminary result)   Collection Time: 04/28/24  1:46 PM   Specimen: BLOOD LEFT WRIST  Result Value Ref Range Status   Specimen Description   Final    BLOOD LEFT WRIST Performed at [facility], [address], [place], [place] [address]    Special Requests   Final    BOTTLES DRAWN AEROBIC AND ANAEROBIC Blood Culture adequate volume Performed at [facility], [address] [place], [place] [address]    Culture  Setup Time (A)  Final    YEAST AEROBIC BOTTLE ONLY CRITICAL RESULT CALLED TO, READ BACK BY AND VERIFIED WITH: NATHAN BELUE @ 04/30/2024 0216 AB Performed at [facility], [address] [place], [place] [address]    Culture YEAST (A)  Final   Report Status PENDING  Incomplete  Blood Culture ID Panel (Reflexed)     Status: Abnormal   Collection Time: 04/28/24  1:46 PM  Result Value Ref Range Status    Enterococcus faecalis NOT DETECTED NOT DETECTED Final   Enterococcus Faecium NOT DETECTED NOT DETECTED Final   Listeria monocytogenes NOT DETECTED NOT DETECTED Final   Staphylococcus species NOT DETECTED NOT DETECTED Final   Staphylococcus aureus (BCID) NOT DETECTED NOT DETECTED Final   Staphylococcus epidermidis NOT DETECTED NOT DETECTED Final   Staphylococcus lugdunensis NOT DETECTED NOT DETECTED Final   Streptococcus species NOT DETECTED NOT DETECTED Final   Streptococcus agalactiae NOT DETECTED NOT DETECTED Final   Streptococcus pneumoniae NOT DETECTED NOT DETECTED Final   Streptococcus pyogenes NOT DETECTED NOT DETECTED Final   A.calcoaceticus-baumannii NOT DETECTED NOT DETECTED Final   Bacteroides fragilis NOT DETECTED NOT DETECTED Final   Enterobacterales NOT DETECTED NOT DETECTED Final   Enterobacter cloacae complex NOT DETECTED NOT DETECTED Final   Escherichia coli NOT DETECTED NOT DETECTED Final   Klebsiella aerogenes NOT DETECTED NOT DETECTED Final   Klebsiella oxytoca NOT DETECTED NOT DETECTED Final   Klebsiella pneumoniae NOT DETECTED NOT DETECTED Final   Proteus species NOT DETECTED NOT DETECTED Final   Salmonella species NOT DETECTED NOT DETECTED Final   Serratia marcescens NOT DETECTED NOT DETECTED Final   Haemophilus influenzae NOT DETECTED NOT DETECTED Final   Neisseria meningitidis NOT DETECTED NOT DETECTED Final   Pseudomonas aeruginosa NOT DETECTED NOT DETECTED Final   Stenotrophomonas maltophilia NOT DETECTED NOT DETECTED Final   Candida albicans DETECTED (A) NOT DETECTED Final    Comment: CRITICAL RESULT CALLED TO, READ BACK BY AND VERIFIED WITH: NATHAN BELUE @  04/30/2024 0216 AB    Candida auris NOT DETECTED NOT DETECTED Final   Candida glabrata DETECTED (A) NOT DETECTED Final    Comment: CRITICAL RESULT CALLED TO, READ BACK BY AND VERIFIED WITH: NATHAN BELUE @ 04/30/2024 0216 AB    Candida krusei NOT DETECTED NOT DETECTED Final   Candida parapsilosis NOT  DETECTED NOT DETECTED Final   Candida tropicalis NOT DETECTED NOT DETECTED Final   Cryptococcus neoformans/gattii NOT DETECTED NOT DETECTED Final    Comment: Performed at [facility], [address] [place], [place] [address]    IMAGING: US  Abdomen Limited RUQ (LIVER/GB) Result Date: 04/28/2024 EXAM: Right Upper Quadrant Abdominal Ultrasound 04/28/2024 02:34:52 PM TECHNIQUE: Real-time ultrasonography of the right upper quadrant of the abdomen was performed. COMPARISON: CT abdomen and pelvis 04/28/2024. CLINICAL HISTORY: Elevated LFTs. FINDINGS: LIVER: The liver demonstrates normal echogenicity. No intrahepatic biliary ductal dilatation. No evidence of mass. BILIARY SYSTEM: No pericholecystic fluid or wall thickening. No cholelithiasis. Common bile duct is within normal limits measuring 8 mm. OTHER: No right upper quadrant ascites. IMPRESSION: 1. No acute findings. 2. Common bile duct measures 8 mm which is within normal limits . Electronically signed by: Greig Pique MD 04/28/2024 10:02 PM EST RP Workstation: [id number]   CT LUMBAR SPINE WO CONTRAST Result Date: 04/28/2024 EXAM: CT THORACIC AND LUMBAR SPINE WITHOUT INTRAVENOUS CONTRAST 04/28/2024 05:08:00 PM TECHNIQUE: CT of the thoracic and lumbar spine was performed without the administration of intravenous contrast. Multiplanar reformatted images are provided for review. Automated exposure control, iterative reconstruction, and/or weight based adjustment of the mA/kV was utilized to reduce the radiation dose to as low as reasonably achievable. Incidental adrenal and/or renal findings do not require follow up imaging. COMPARISON: Thoracic and lumbar spine radiographs 04/28/2024. CT abdomen and pelvis 04/28/2024. CTA chest 09/04/2019. CLINICAL HISTORY: fall FINDINGS: THORACIC SPINE: BONES AND ALIGNMENT: Exaggerated upper thoracic kyphosis. Mild upper thoracic levoscoliosis and mid to lower thoracic dextroscoliosis. No significant listhesis.  Oblique nondisplaced fracture through the anterior aspect of the T5 vertebral body and superior endplate with involvement of anterior and right lateral osteophytes, new from 2021 but of indeterminate age as some margins appear more sclerotic/chronic while others are less well defined. No definite associated middle or posterior column fracture. Chronic mild T12 superior endplate compression fracture, unchanged from 2021. Ankylosis by bridging vertebral osteophytes throughout the thoracic spine. Multilevel facet ankylosis as well. DEGENERATIVE CHANGES: No high-grade spinal canal stenosis. SOFT TISSUES: No acute abnormality. Aortic and coronary atherosclerosis. LUMBAR SPINE: BONES AND ALIGNMENT: 5 lumbar type vertebrae. Trace anterolisthesis of L4 and L5. Horizontal fracture through the superior half of the L1 vertebral body with 7 mm of distraction and with a nondisplaced fracture extending through the T12 posterior elements bilaterally. Some of the fracture margins are mildly sclerotic, and this may be subacute. Widespread posterior element ankylosis in the lumbar spine. DEGENERATIVE CHANGES: At least mild spinal stenosis and mild to moderate left neural foraminal stenosis at L4-L5 due to disc bulging, endplate spurring, and severe facet hypertrophy. SOFT TISSUES: Likely mild paravertebral soft tissue swelling at L1. IMPRESSION: 1. Horizontal fracture through the superior half of the L1 vertebral body and T12 posterior elements, possibly subacute. 2. Oblique nondisplaced fracture through the anterior aspect of the T5 vertebral body, new from 2021 but of indeterminate age. 3. Widespread thoracic and lumbar ankylosis 4. Chronic T12 compression fracture. Electronically signed by: Dasie Hamburg MD 04/28/2024 06:37 PM EST RP Workstation: [id number]   CT THORACIC SPINE WO CONTRAST Result Date: 04/28/2024  EXAM: CT THORACIC AND LUMBAR SPINE WITHOUT INTRAVENOUS CONTRAST 04/28/2024 05:08:00 PM TECHNIQUE: CT of the thoracic  and lumbar spine was performed without the administration of intravenous contrast. Multiplanar reformatted images are provided for review. Automated exposure control, iterative reconstruction, and/or weight based adjustment of the mA/kV was utilized to reduce the radiation dose to as low as reasonably achievable. Incidental adrenal and/or renal findings do not require follow up imaging. COMPARISON: Thoracic and lumbar spine radiographs 04/28/2024. CT abdomen and pelvis 04/28/2024. CTA chest 09/04/2019. CLINICAL HISTORY: fall FINDINGS: THORACIC SPINE: BONES AND ALIGNMENT: Exaggerated upper thoracic kyphosis. Mild upper thoracic levoscoliosis and mid to lower thoracic dextroscoliosis. No significant listhesis. Oblique nondisplaced fracture through the anterior aspect of the T5 vertebral body and superior endplate with involvement of anterior and right lateral osteophytes, new from 2021 but of indeterminate age as some margins appear more sclerotic/chronic while others are less well defined. No definite associated middle or posterior column fracture. Chronic mild T12 superior endplate compression fracture, unchanged from 2021. Ankylosis by bridging vertebral osteophytes throughout the thoracic spine. Multilevel facet ankylosis as well. DEGENERATIVE CHANGES: No high-grade spinal canal stenosis. SOFT TISSUES: No acute abnormality. Aortic and coronary atherosclerosis. LUMBAR SPINE: BONES AND ALIGNMENT: 5 lumbar type vertebrae. Trace anterolisthesis of L4 and L5. Horizontal fracture through the superior half of the L1 vertebral body with 7 mm of distraction and with a nondisplaced fracture extending through the T12 posterior elements bilaterally. Some of the fracture margins are mildly sclerotic, and this may be subacute. Widespread posterior element ankylosis in the lumbar spine. DEGENERATIVE CHANGES: At least mild spinal stenosis and mild to moderate left neural foraminal stenosis at L4-L5 due to disc bulging, endplate  spurring, and severe facet hypertrophy. SOFT TISSUES: Likely mild paravertebral soft tissue swelling at L1. IMPRESSION: 1. Horizontal fracture through the superior half of the L1 vertebral body and T12 posterior elements, possibly subacute. 2. Oblique nondisplaced fracture through the anterior aspect of the T5 vertebral body, new from 2021 but of indeterminate age. 3. Widespread thoracic and lumbar ankylosis 4. Chronic T12 compression fracture. Electronically signed by: Dasie Hamburg MD 04/28/2024 06:37 PM EST RP Workstation: [id number]   CT CERVICAL SPINE WO CONTRAST Result Date: 04/28/2024 EXAM: CT CERVICAL SPINE WITHOUT CONTRAST 04/28/2024 05:08:00 PM TECHNIQUE: CT of the cervical spine was performed without the administration of intravenous contrast. Multiplanar reformatted images are provided for review. Automated exposure control, iterative reconstruction, and/or weight based adjustment of the mA/kV was utilized to reduce the radiation dose to as low as reasonably achievable. COMPARISON: CT cervical spine 04/30/2023 CLINICAL HISTORY: Fall FINDINGS: CERVICAL SPINE: BONES AND ALIGNMENT: Chronic straightening of the normal cervical lordosis. Slight left convex cervical curvature near the cervicothoracic junction. No significant listhesis. No acute fracture or suspicious lesion. DEGENERATIVE CHANGES: Spondylosis with large anterior vertebral osteophytes throughout the majority of the cervical spine. Advanced multilevel facet arthrosis. Potentially moderate multilevel spinal stenosis. Moderate to severe bilateral neural foraminal stenosis at C3-C4. SOFT TISSUES: No prevertebral soft tissue swelling. IMPRESSION: 1. No acute cervical spine fracture or traumatic malalignment. 2. Diffuse cervical spondylosis and facet arthrosis. Electronically signed by: Dasie Hamburg MD 04/28/2024 06:13 PM EST RP Workstation: [id number]   DG Thoracic Spine 2 View Result Date: 04/28/2024 CLINICAL DATA:  Multiple fall EXAM: THORACIC  SPINE 2 VIEWS COMPARISON:  CT 04/28/2024, chest CT 09/04/2019 FINDINGS: Scoliosis of the thoracic spine. Moderate superior endplate deformity at T12, probably chronic to the chest CT from 2021 but lucencies on today's CT in the region of the  spinous process. Lucency at the superior aspect of L1 vertebral body. Remaining vertebra demonstrate grossly normal stature. Multilevel flowing osteophytosis. IMPRESSION: 1. Moderate superior endplate deformity at T12, probably chronic to the chest CT from 2021 2. Suspected acute fracture involving L1 and posterior elements at T12. Dedicated thoracolumbar CT recommended for further assessment Electronically Signed   By: Luke Bun M.D.   On: 04/28/2024 16:14   DG Lumbar Spine 2-3 Views Result Date: 04/28/2024 CLINICAL DATA:  Multiple fall EXAM: LUMBAR SPINE - 2-3 VIEW COMPARISON:  CT 04/28/2024 FINDINGS: Lumbar alignment within normal limits. Aortic atherosclerosis. Abnormal lucency at the L1 vertebral body with superior endplate deformity. There is also additional superior endplate deformity at T12. On sagittal reconstructions of today CT, possible linear lucency at the spinous process of T12 and left posterior arch of T12. Moderate disc space narrowing L3-L4 and L5-S1. Prominent lower lumbar facet degenerative changes. IMPRESSION: 1. Abnormal lucency at the L1 vertebral body with superior endplate deformity, suspicious for acute fracture. Additional superior endplate deformity at T12, age indeterminate. Possible linear lucency at the spinous process of T12 and left posterior arch of T12, suspicious for acute fracture. Dedicated thoracolumbar CT recommended for further assessment. 2. Moderate degenerative changes of the lower lumbar spine. Electronically Signed   By: Luke Bun M.D.   On: 04/28/2024 16:12   DG Cervical Spine 2 or 3 views Result Date: 04/28/2024 CLINICAL DATA:  Multiple fall EXAM: CERVICAL SPINE - 2-3 VIEW COMPARISON:  04/12/2011, CT 01/28/2023  FINDINGS: Straightening of the cervical spine. Normal prevertebral soft tissue thickness. Dens and lateral masses are grossly within normal limits allowing for suboptimal positioning. Moderate diffuse disc space narrowing C3 through C7 with bulky multilevel osteophytes. IMPRESSION: Straightening of the cervical spine with moderate diffuse degenerative changes. Cross-sectional imaging follow-up if persistent concern for cervical fracture Electronically Signed   By: Luke Bun M.D.   On: 04/28/2024 16:05   DG HIPS BILAT WITH PELVIS MIN 5 VIEWS Result Date: 04/28/2024 CLINICAL DATA:  Recent fall concern for fracture EXAM: DG HIP (WITH OR WITHOUT PELVIS) 5+V BILAT COMPARISON:  CT 04/28/2024 FINDINGS: SI joints are non widened. Pubic symphysis appears intact. No definitive fracture or malalignment. Prominent acetabular osteophytes. Mild bilateral hip degenerative change IMPRESSION: No acute osseous abnormality. Cross-sectional imaging follow-up if persistent concern for hip or pelvic fracture Electronically Signed   By: Luke Bun M.D.   On: 04/28/2024 16:03   CT HEAD WO CONTRAST ( ) Result Date: 04/28/2024 EXAM: CT HEAD WITHOUT 04/28/2024 03:06:26 PM TECHNIQUE: CT of the head was performed without the administration of intravenous contrast. Automated exposure control, iterative reconstruction, and/or weight based adjustment of the mA/kV was utilized to reduce the radiation dose to as low as reasonably achievable. COMPARISON: head CT 04/30/2023 CLINICAL HISTORY: Mental status change, unknown cause FINDINGS: BRAIN AND VENTRICLES: There is no evidence of an acute infarct, intracranial hemorrhage, mass, midline shift, hydrocephalus, or extra-axial fluid collection. Cerebral volume is normal for age. Cerebral white matter hypodensities are nonspecific but compatible with mild chronic small vessel ischemic disease. Calcified atherosclerosis at the skull base. ORBITS: Bilateral cataract extraction. SINUSES AND  MASTOIDS: No acute abnormality. SOFT TISSUES AND SKULL: No acute skull fracture. No acute soft tissue abnormality. IMPRESSION: 1. No acute intracranial abnormality. 2. Mild chronic small vessel ischemic disease. Electronically signed by: Dasie Hamburg MD 04/28/2024 03:12 PM EST RP Workstation: [id number]   CT ABDOMEN PELVIS WO CONTRAST Result Date: 04/28/2024 CLINICAL DATA:  Diffuse abdominal pain.  Sepsis. EXAM: CT  ABDOMEN AND PELVIS WITHOUT CONTRAST TECHNIQUE: Multidetector CT imaging of the abdomen and pelvis was performed following the standard protocol without IV contrast. RADIATION DOSE REDUCTION: This exam was performed according to the departmental dose-optimization program which includes automated exposure control, adjustment of the mA and/or kV according to patient size and/or use of iterative reconstruction technique. COMPARISON:  10/10/2010 FINDINGS: Lower chest: No acute findings. Hepatobiliary: No mass visualized on this unenhanced exam. Prior cholecystectomy. No evidence of biliary obstruction. Pancreas: No mass or inflammatory process visualized on this unenhanced exam. Spleen:  Within normal limits in size. Adrenals/Urinary tract: No evidence of urolithiasis. Mild left renal pyelectasis and ureterectasis is seen with gas in the renal collecting systems, most likely due to vesicoureteral reflux. Small amount of gas also seen in urinary bladder, likely from recent instrumentation. Stomach/Bowel: No evidence of small bowel obstruction, inflammatory process, or abnormal fluid collections. Diffuse gaseous distention of colon is seen with moderate stool burden is, which may be due to ileus or constipation. Vascular/Lymphatic: No pathologically enlarged lymph nodes identified. No evidence of abdominal aortic aneurysm. Reproductive: Prior hysterectomy noted. Adnexal regions are unremarkable in appearance. Other:  None. Musculoskeletal:  No suspicious bone lesions identified. IMPRESSION: Mild left renal  pyelectasis and ureterectasis with gas in the renal collecting system, most likely due to vesicoureteral reflux. No evidence of ureteral calculus. Gas in urinary bladder. Recommend clinical correlation for recent instrumentation. Diffuse gaseous distention of colon with moderate stool burden, which may be due to ileus or constipation. Electronically Signed   By: Norleen DELENA Kil M.D.   On: 04/28/2024 11:15   DG Chest Port 1 View Result Date: 04/28/2024 CLINICAL DATA:  Questionable sepsis.  Tachycardia. EXAM: PORTABLE CHEST 1 VIEW COMPARISON:  04/30/2023 FINDINGS: Rotated film. The cardio pericardial silhouette is enlarged. Atelectasis or infiltrate noted at the right base. Soft tissue fullness noted in the right hilar region with volume loss noted right hemithorax, likely accentuated by rotation. Bones are diffusely demineralized with degenerative changes in each shoulder. IMPRESSION: 1. Atelectasis or infiltrate at the right base. 2. Soft tissue fullness in the right hilar region with volume loss in the right hemithorax, likely accentuated by rotation. Dedicated upright PA and lateral chest x-ray recommended when patient is able. Electronically Signed   By: Camellia Candle M.D.   On: 04/28/2024 08:55    Assessment:   CHRISTEN WARDROP is a 83 y.o. female with multiple medical problems admitted with NV weakness and found to have urosepsis with E coli in blood and urine and surprising 2 forms of candida on one set of BCX.   Recommendations Urosepsis with E. coli in urine and blood-clinically improving.  She had some gas noted in her urinary system. Continue ceftriaxone  for now but can transition to orals as she improves   Positive blood culture for Candida 2 types-I suspect this is contaminant.  Unfortunately she did not have blood cultures repeated prior to starting antifungals. She is not a typical patient for candidemia with no indwelling lines or pacemakers She will need an echocardiogram.   Thank you  very much for allowing me to participate in the care of this patient. Please call with questions.   Alm SQUIBB. Epifanio, MD       [1]  Social History Tobacco Use   Smoking status: Never    Passive exposure: Past   Smokeless tobacco: Never  Vaping Use   Vaping status: Never Used  Substance Use Topics   Alcohol use: No   Drug use:  No  [2] No Known Allergies

## 2024-05-01 NOTE — Telephone Encounter (Signed)
 Patient still hospitalized. Scheduled appointment with brooke on 05/29/24 at 3pm. We will need to call her to make sure she is aware of this appointment after she is discharged.

## 2024-05-01 NOTE — Care Management Important Message (Signed)
 Important Message  Patient Details  Name: Cynthia Singh MRN: [id number] Date of Birth: 08-Aug-1940   Important Message Given:  Yes - Medicare IM     [name] 05/01/2024, 2:56 PM

## 2024-05-01 NOTE — Progress Notes (Signed)
 PROGRESS NOTE    Cynthia Singh   FMW:[id number] DOB: 19-Feb-1941  DOA: 04/28/2024 Date of Service: 05/01/2024 which is hospital day 3  PCP: Wendee Lynwood HERO, NP    Hospital course / significant events:   HPI: Cynthia Singh is a 83 y.o. year old female with medical history of hypertension, hyperlipidemia, type 2 diabetes, CKD 3A presenting to the ED with nausea, vomiting, diarrhea, suprapubic abd pain, chills without fever, and generalized weakness. Disoriented and hx limited. Family reports no underlying cognitive problems.   12/12: to ED. Admitted for UTI. Chest x-ray with atelectasis and CT abdomen pelvis shows mild left renal pyelectasis and ureterectasis with gas in the renal collecting system with concern for VUR.  12/13: (+)Ecoli cultures urine and blood, continue abx. Neurosurgery has seen pt, no intervention planned. Alert but remains confused.  12/14: (+)candidemia resulted overnight, added micofungin. Mental status is improved today  12/15: ID consult - suspect fungemia likely contaminant      Consultants:  Neurosurgery Infectious disease   Procedures/Surgeries: noen      ASSESSMENT & PLAN:   Sepsis d/t E.Coli UTI Severe sepsis given encephalopathy, AKI Bacteremia d/t UTI BCx L wrist collected 12/12 @13 :46 --> (+)yeast in aerobic bottle, candida albicans, candida glabrata BCx L arm collected 12/12 @08 :38 --> (+)Ecoli in aerobic and anaerobic  UCx collected 12/12 @11 :03 --> (+)Ecoli sensitive to ceftriaxone  Hematuria d/t UTI Lactic acidosis - resolved  Leukocytosis - improving Ceftriaxone , Micofungin Blood culture (+)enterobacterales / Ecoli pending susceptibilities, (+)yeast  Monitor sepsis parameters and mental status Repeat BCx per ID  Echocardiogram    Concern for urinary retention / vesicoureteral reflux Leaking around Foley Foley d/c  OK for [place] if needed Bladder scan q8h / prn   Fungemia - most likely contaminant as not typical patient  for this problem, no indwelling lines / pacemakers  Deescalate abx per ID   AKI d/t UTI/dehydration - improvement question also d/t vesicoureteral reflux / retention AKI on CKD3a Cr 3.27 on admission --> 2.9 HD2 04/29/2024 --> 2.4 HD3 IV fluids Monitor BMP Tx underlying cause(s) Foley continue   Subacute vertebral fracture L1, T12 Per neurosurgery --> no intervention acutely  TLSO brace when out of bed   PT/OT as able Outpatient neurosurgery follow up   Elevated LFTs, mild  Exact etiology of this is unknown  No concerns RUQ US  hepatitis panel  Trend    Hypertension Hold home meds given sepsis / risk hypotension   Type 2 diabetes At home she is on insulin  and metformin  and Jardiance  SSI while here   Hyperlipidemia/CAD:  Holding statin w/ borderline LFT ASA, Plavix     Recent fall PT/OT  Acute metabolic encephalopathy d/t UTI / uremia - resolved  Monitor   overweight based on BMI: Body mass index is 25.95 kg/m.[name] Significantly low or high BMI is associated with higher medical risk.  Underweight - under 18  overweight - 25 to 29 obese - 30 or more Class 1 obesity: BMI of 30.0 to 34 Class 2 obesity: BMI of 35.0 to 39 Class 3 obesity: BMI of 40.0 to 49 Super Morbid Obesity: BMI 50-59 Super-super Morbid Obesity: BMI 60+ Healthy nutrition and physical activity advised as adjunct to other disease management and risk reduction treatments    DVT prophylaxis: heparin  IV fluids: LR continuous IV fluids dc now that taking po, monitor BMP Nutrition: dysphagia diet Central lines / other devices: Foley cath  Code Status: FULL CODE ACP documentation reviewed:  none on file in  [facility]  TOC needs: TBD Medical barriers to dispo: IV abx, bacteremia. Expected medical readiness for discharge few days.           Subjective / Brief ROS:  Patient denies pain remains alert today Tired but otherwise no complaints    Family Communication: spoke on phone w/ grandson  05/01/2024 6:44 PM all questions answered     Objective Findings:  Vitals:   05/01/24 0525 05/01/24 0755 05/01/24 1157 05/01/24 1556  BP: 122/70 138/68 139/65 (!) 141/67  Pulse: 88 85 85 87  Resp: 16 15    Temp: 98.7 F (37.1 C) 97.7 F (36.5 C) 97.8 F (36.6 C) 98.3 F (36.8 C)  TempSrc: Axillary     SpO2: 93% 96% 97% 97%  Weight:      Height:        Intake/Output Summary (Last 24 hours) at 05/01/2024 1844 Last data filed at 05/01/2024 1835 Gross per 24 hour  Intake 1320 ml  Output 700 ml  Net 620 ml   Filed Weights   04/29/24 0500 04/30/24 0500 05/01/24 0417  Weight: 77.4 kg 77.3 kg 77.3 kg    Examination:  Physical Exam Constitutional:      General: She is not in acute distress.    Appearance: She is ill-appearing.  Cardiovascular:     Rate and Rhythm: Normal rate and regular rhythm.  Pulmonary:     Effort: Pulmonary effort is normal.     Breath sounds: Normal breath sounds.  Abdominal:     Palpations: Abdomen is soft.  Neurological:     Mental Status: She is alert and oriented to person, place, and time. Mental status is at baseline.  Psychiatric:        Mood and Affect: Mood normal.        Behavior: Behavior normal.          Scheduled Medications:   amLODipine   2.5 mg Oral Daily   aspirin  EC  81 mg Oral Daily   [START ON 05/02/2024] Chlorhexidine  Gluconate Cloth  6 each Topical Q0600   clopidogrel   75 mg Oral Daily   heparin   5,000 Units Subcutaneous Q8H   nortriptyline   150 mg Oral QHS   sodium chloride  flush  3 mL Intravenous Q12H    Continuous Infusions:  cefTRIAXone  (ROCEPHIN )  IV 2 g (05/01/24 1737)   micafungin  (MYCAMINE ) 100 mg in sodium chloride  0.9 % 100 mL IVPB 100 mg (05/01/24 0921)    PRN Medications:  acetaminophen  **OR** acetaminophen , hydrALAZINE , HYDROmorphone  (DILAUDID ) injection, ondansetron  **OR** ondansetron  (ZOFRAN ) IV, mouth rinse, oxyCODONE , senna-docusate  Antimicrobials from admission:  Anti-infectives (From  admission, onward)    Start     Dose/Rate Route Frequency Ordered Stop   04/30/24 0400  micafungin  (MYCAMINE ) 100 mg in sodium chloride  0.9 % 100 mL IVPB        100 mg 105 mL/hr over 1 Hours Intravenous Every 24 hours 04/30/24 0243     04/28/24 1600  cefTRIAXone  (ROCEPHIN ) 2 g in sodium chloride  0.9 % 100 mL IVPB        2 g 200 mL/hr over 30 Minutes Intravenous Every 24 hours 04/28/24 1315 05/05/24 1559   04/28/24 0930  ceFEPIme  (MAXIPIME ) 2 g in sodium chloride  0.9 % 100 mL IVPB        2 g 200 mL/hr over 30 Minutes Intravenous  Once 04/28/24 0923 04/28/24 1022   04/28/24 0930  metroNIDAZOLE  (FLAGYL ) IVPB 500 mg        500 mg 100  mL/hr over 60 Minutes Intravenous  Once 04/28/24 9076 04/28/24 1136           Data Reviewed:  I have personally reviewed the following...  CBC: Recent Labs  Lab 04/28/24 0838 04/29/24 0345 04/30/24 0345 05/01/24 0420  WBC 30.3* 19.1* 18.5* 12.0*  NEUTROABS 25.4*  --   --   --   HGB 11.4* 10.9* 11.2* 10.9*  HCT 35.6* 32.8* 35.2* 33.0*  MCV 89.2 87.7 90.7 86.6  PLT 181 146* 132* 125*   Basic Metabolic Panel: Recent Labs  Lab 04/28/24 0838 04/29/24 0345 04/30/24 0345 05/01/24 0420  NA 134* 139 140 134*  K 4.7 4.4 5.0 3.6  CL 99 108 109 103  CO2 15* 17* 18* 17*  GLUCOSE 179* 134* 87 93  BUN 55* 59* 62* 56*  CREATININE 3.27* 2.90* 2.38* 1.96*  CALCIUM  7.7* 8.3* 8.6* 8.3*  MG 1.5*  --   --   --    GFR: Estimated Creatinine Clearance: 25 mL/min (A) (by C-G formula based on SCr of 1.96 mg/dL (H)). Liver Function Tests: Recent Labs  Lab 04/28/24 0838 04/30/24 0345  AST 45* 20  ALT 71* 24  ALKPHOS 165* 165*  BILITOT 0.7 0.3  PROT 6.3* 6.3*  ALBUMIN 3.0* 2.6*   Recent Labs  Lab 04/28/24 0838  LIPASE <10*   No results for input(s): AMMONIA in the last 168 hours. Coagulation Profile: Recent Labs  Lab 04/28/24 1346  INR 1.4*   Cardiac Enzymes: No results for input(s): CKTOTAL, CKMB, CKMBINDEX, TROPONINI in the  last 168 hours. BNP (last 3 results) No results for input(s): PROBNP in the last 8760 hours. HbA1C: No results for input(s): HGBA1C in the last 72 hours. CBG: Recent Labs  Lab 04/30/24 1556 04/30/24 2000 05/01/24 0756 05/01/24 1157 05/01/24 1702  GLUCAP 133* 84 93 142* 106*   Lipid Profile: No results for input(s): CHOL, HDL, LDLCALC, TRIG, CHOLHDL, LDLDIRECT in the last 72 hours. Thyroid Function Tests: No results for input(s): TSH, T4TOTAL, FREET4, T3FREE, THYROIDAB in the last 72 hours. Anemia Panel: No results for input(s): VITAMINB12, FOLATE, FERRITIN, TIBC, IRON , RETICCTPCT in the last 72 hours. Most Recent Urinalysis On File:     Component Value Date/Time   COLORURINE AMBER (A) 04/28/2024 1103   APPEARANCEUR TURBID (A) 04/28/2024 1103   LABSPEC 1.013 04/28/2024 1103   PHURINE 6.0 04/28/2024 1103   GLUCOSEU >=500 (A) 04/28/2024 1103   HGBUR LARGE (A) 04/28/2024 1103   BILIRUBINUR NEGATIVE 04/28/2024 1103   BILIRUBINUR neg 06/14/2019 1408   KETONESUR NEGATIVE 04/28/2024 1103   PROTEINUR 100 (A) 04/28/2024 1103   UROBILINOGEN 0.2 06/14/2019 1408   UROBILINOGEN 1.0 10/19/2012 1030   NITRITE POSITIVE (A) 04/28/2024 1103   LEUKOCYTESUR MODERATE (A) 04/28/2024 1103   Sepsis Labs: @LABRCNTIP (procalcitonin:4,lacticidven:4) Microbiology: Recent Results (from the past 240 hours)  Resp panel by RT-PCR (RSV, Flu A&B, Covid) Anterior Nasal Swab     Status: None   Collection Time: 04/28/24  8:30 AM   Specimen: Anterior Nasal Swab  Result Value Ref Range Status   SARS Coronavirus 2 by RT PCR NEGATIVE NEGATIVE Final    Comment: (NOTE) SARS-CoV-2 target nucleic acids are NOT DETECTED.  The SARS-CoV-2 RNA is generally detectable in upper respiratory specimens during the acute phase of infection. The lowest concentration of SARS-CoV-2 viral copies this assay can detect is 138 copies/mL. A negative result does not preclude  SARS-Cov-2 infection and should not be used as the sole basis for treatment or other patient  management decisions. A negative result may occur with  improper specimen collection/handling, submission of specimen other than nasopharyngeal swab, presence of viral mutation(s) within the areas targeted by this assay, and inadequate number of viral copies(<138 copies/mL). A negative result must be combined with clinical observations, patient history, and epidemiological information. The expected result is Negative.  Fact Sheet for Patients:  bloggercourse.com  Fact Sheet for Healthcare Providers:  seriousbroker.it  This test is no t yet approved or cleared by the [place]  FDA and  has been authorized for detection and/or diagnosis of SARS-CoV-2 by FDA under an Emergency Use Authorization (EUA). This EUA will remain  in effect (meaning this test can be used) for the duration of the COVID-19 declaration under Section 564(b)(1) of the Act, 21 U.S.C.section 360bbb-3(b)(1), unless the authorization is terminated  or revoked sooner.       Influenza A by PCR NEGATIVE NEGATIVE Final   Influenza B by PCR NEGATIVE NEGATIVE Final    Comment: (NOTE) The Xpert Xpress SARS-CoV-2/FLU/RSV plus assay is intended as an aid in the diagnosis of influenza from Nasopharyngeal swab specimens and should not be used as a sole basis for treatment. Nasal washings and aspirates are unacceptable for Xpert Xpress SARS-CoV-2/FLU/RSV testing.  Fact Sheet for Patients: bloggercourse.com  Fact Sheet for Healthcare Providers: seriousbroker.it  This test is not yet approved or cleared by the [place]  FDA and has been authorized for detection and/or diagnosis of SARS-CoV-2 by FDA under an Emergency Use Authorization (EUA). This EUA will remain in effect (meaning this test can be used) for the duration of  the COVID-19 declaration under Section 564(b)(1) of the Act, 21 U.S.C. section 360bbb-3(b)(1), unless the authorization is terminated or revoked.     Resp Syncytial Virus by PCR NEGATIVE NEGATIVE Final    Comment: (NOTE) Fact Sheet for Patients: bloggercourse.com  Fact Sheet for Healthcare Providers: seriousbroker.it  This test is not yet approved or cleared by the [place]  FDA and has been authorized for detection and/or diagnosis of SARS-CoV-2 by FDA under an Emergency Use Authorization (EUA). This EUA will remain in effect (meaning this test can be used) for the duration of the COVID-19 declaration under Section 564(b)(1) of the Act, 21 U.S.C. section 360bbb-3(b)(1), unless the authorization is terminated or revoked.  Performed at [facility], [address] [place], [place] [address]   Blood Culture (routine x 2)     Status: Abnormal   Collection Time: 04/28/24  8:38 AM   Specimen: BLOOD  Result Value Ref Range Status   Specimen Description   Final    BLOOD BLOOD LEFT ARM Performed at [facility], [address], [place], [place] [address]    Special Requests   Final    BOTTLES DRAWN AEROBIC AND ANAEROBIC Blood Culture adequate volume Performed at [facility], [address] [place], [place] [address]    Culture  Setup Time   Final    IN BOTH AEROBIC AND ANAEROBIC BOTTLES GRAM NEGATIVE RODS CRITICAL RESULT CALLED TO, READ BACK BY AND VERIFIED WITH: NATHAN BELUE @ 04/28/2024 2243 AB Performed at [facility], [address], [place], [place] [address]    Culture ESCHERICHIA COLI (A)  Final   Report Status 05/01/2024 FINAL  Final   Organism ID, Bacteria ESCHERICHIA COLI  Final      Susceptibility   Escherichia coli - MIC*    AMPICILLIN >=32 RESISTANT Resistant     CEFAZOLIN (NON-URINE) 8 RESISTANT Resistant  CEFEPIME  <=0.12 SENSITIVE Sensitive     ERTAPENEM <=0.12  SENSITIVE Sensitive     CEFTRIAXONE  <=0.25 SENSITIVE Sensitive     CIPROFLOXACIN  <=0.06 SENSITIVE Sensitive     GENTAMICIN <=1 SENSITIVE Sensitive     MEROPENEM <=0.25 SENSITIVE Sensitive     TRIMETH /SULFA  <=20 SENSITIVE Sensitive     AMPICILLIN/SULBACTAM >=32 RESISTANT Resistant     PIP/TAZO Value in next row Sensitive      <=4 SENSITIVEThis is a modified FDA-approved test that has been validated and its performance characteristics determined by the reporting laboratory.  This laboratory is certified under the Clinical Laboratory Improvement Amendments CLIA as qualified to perform high complexity clinical laboratory testing.    * ESCHERICHIA COLI  Blood Culture ID Panel (Reflexed)     Status: Abnormal   Collection Time: 04/28/24  8:38 AM  Result Value Ref Range Status   Enterococcus faecalis NOT DETECTED NOT DETECTED Final   Enterococcus Faecium NOT DETECTED NOT DETECTED Final   Listeria monocytogenes NOT DETECTED NOT DETECTED Final   Staphylococcus species NOT DETECTED NOT DETECTED Final   Staphylococcus aureus (BCID) NOT DETECTED NOT DETECTED Final   Staphylococcus epidermidis NOT DETECTED NOT DETECTED Final   Staphylococcus lugdunensis NOT DETECTED NOT DETECTED Final   Streptococcus species NOT DETECTED NOT DETECTED Final   Streptococcus agalactiae NOT DETECTED NOT DETECTED Final   Streptococcus pneumoniae NOT DETECTED NOT DETECTED Final   Streptococcus pyogenes NOT DETECTED NOT DETECTED Final   A.calcoaceticus-baumannii NOT DETECTED NOT DETECTED Final   Bacteroides fragilis NOT DETECTED NOT DETECTED Final   Enterobacterales DETECTED (A) NOT DETECTED Final    Comment: Enterobacterales represent a large order of gram negative bacteria, not a single organism. CRITICAL RESULT CALLED TO, READ BACK BY AND VERIFIED WITH: NATHAN BELUE @ 04/28/2024 2243 AB    Enterobacter cloacae complex NOT DETECTED NOT DETECTED Final   Escherichia coli DETECTED (A) NOT DETECTED Final    Comment:  CRITICAL RESULT CALLED TO, READ BACK BY AND VERIFIED WITH: NATHAN BELUE @ 04/28/2024 2243 AB    Klebsiella aerogenes NOT DETECTED NOT DETECTED Final   Klebsiella oxytoca NOT DETECTED NOT DETECTED Final   Klebsiella pneumoniae NOT DETECTED NOT DETECTED Final   Proteus species NOT DETECTED NOT DETECTED Final   Salmonella species NOT DETECTED NOT DETECTED Final   Serratia marcescens NOT DETECTED NOT DETECTED Final   Haemophilus influenzae NOT DETECTED NOT DETECTED Final   Neisseria meningitidis NOT DETECTED NOT DETECTED Final   Pseudomonas aeruginosa NOT DETECTED NOT DETECTED Final   Stenotrophomonas maltophilia NOT DETECTED NOT DETECTED Final   Candida albicans NOT DETECTED NOT DETECTED Final   Candida auris NOT DETECTED NOT DETECTED Final   Candida glabrata NOT DETECTED NOT DETECTED Final   Candida krusei NOT DETECTED NOT DETECTED Final   Candida parapsilosis NOT DETECTED NOT DETECTED Final   Candida tropicalis NOT DETECTED NOT DETECTED Final   Cryptococcus neoformans/gattii NOT DETECTED NOT DETECTED Final   CTX-M ESBL NOT DETECTED NOT DETECTED Final   Carbapenem resistance IMP NOT DETECTED NOT DETECTED Final   Carbapenem resistance KPC NOT DETECTED NOT DETECTED Final   Carbapenem resistance NDM NOT DETECTED NOT DETECTED Final   Carbapenem resist OXA 48 LIKE NOT DETECTED NOT DETECTED Final   Carbapenem resistance VIM NOT DETECTED NOT DETECTED Final    Comment: Performed at [facility], [address] [place], [place] [address]  Urine Culture (for pregnant, neutropenic or urologic patients or patients with an indwelling urinary catheter)     Status:  Abnormal   Collection Time: 04/28/24 11:03 AM   Specimen: Urine, Clean Catch  Result Value Ref Range Status   Specimen Description   Final    URINE, CLEAN CATCH Performed at [facility], [address] [place], [place] [address]    Special Requests   Final    NONE Performed at [facility], [address] [place], [place] [address]    Culture 50,000 COLONIES/mL ESCHERICHIA COLI (A)  Final   Report Status 04/30/2024 FINAL  Final   Organism ID, Bacteria ESCHERICHIA COLI (A)  Final      Susceptibility   Escherichia coli - MIC*    AMPICILLIN >=32 RESISTANT Resistant     CEFAZOLIN (URINE) Value in next row Sensitive      8 SENSITIVEThis is a modified FDA-approved test that has been validated and its performance characteristics determined by the reporting laboratory.  This laboratory is certified under the Clinical Laboratory Improvement Amendments CLIA as qualified to perform high complexity clinical laboratory testing.    CEFEPIME  Value in next row Sensitive      8 SENSITIVEThis is a modified FDA-approved test that has been validated and its performance characteristics determined by the reporting laboratory.  This laboratory is certified under the Clinical Laboratory Improvement Amendments CLIA as qualified to perform high complexity clinical laboratory testing.    ERTAPENEM Value in next row Sensitive      8 SENSITIVEThis is a modified FDA-approved test that has been validated and its performance characteristics determined by the reporting laboratory.  This laboratory is certified under the Clinical Laboratory Improvement Amendments CLIA as qualified to perform high complexity clinical laboratory testing.    CEFTRIAXONE  Value in next row Sensitive      8 SENSITIVEThis is a modified FDA-approved test that has been validated and its performance characteristics determined by the reporting laboratory.  This laboratory is certified under the Clinical Laboratory Improvement Amendments CLIA as qualified to perform high complexity clinical laboratory testing.    CIPROFLOXACIN  Value in next row Sensitive      8 SENSITIVEThis is a modified FDA-approved test that has been validated and its performance characteristics determined by the reporting laboratory.  This laboratory is certified under the Clinical  Laboratory Improvement Amendments CLIA as qualified to perform high complexity clinical laboratory testing.    GENTAMICIN Value in next row Sensitive      8 SENSITIVEThis is a modified FDA-approved test that has been validated and its performance characteristics determined by the reporting laboratory.  This laboratory is certified under the Clinical Laboratory Improvement Amendments CLIA as qualified to perform high complexity clinical laboratory testing.    NITROFURANTOIN Value in next row Sensitive      8 SENSITIVEThis is a modified FDA-approved test that has been validated and its performance characteristics determined by the reporting laboratory.  This laboratory is certified under the Clinical Laboratory Improvement Amendments CLIA as qualified to perform high complexity clinical laboratory testing.    TRIMETH /SULFA  Value in next row Sensitive      8 SENSITIVEThis is a modified FDA-approved test that has been validated and its performance characteristics determined by the reporting laboratory.  This laboratory is certified under the Clinical Laboratory Improvement Amendments CLIA as qualified to perform high complexity clinical laboratory testing.    AMPICILLIN/SULBACTAM Value in next row Intermediate      8 SENSITIVEThis is a modified FDA-approved test that has been validated and its performance characteristics determined by the reporting laboratory.  This laboratory is certified  under the Clinical Laboratory Improvement Amendments CLIA as qualified to perform high complexity clinical laboratory testing.    PIP/TAZO Value in next row Sensitive      <=4 SENSITIVEThis is a modified FDA-approved test that has been validated and its performance characteristics determined by the reporting laboratory.  This laboratory is certified under the Clinical Laboratory Improvement Amendments CLIA as qualified to perform high complexity clinical laboratory testing.    MEROPENEM Value in next row Sensitive      <=4  SENSITIVEThis is a modified FDA-approved test that has been validated and its performance characteristics determined by the reporting laboratory.  This laboratory is certified under the Clinical Laboratory Improvement Amendments CLIA as qualified to perform high complexity clinical laboratory testing.    * 50,000 COLONIES/mL ESCHERICHIA COLI  Blood Culture (routine x 2)     Status: Abnormal (Preliminary result)   Collection Time: 04/28/24  1:46 PM   Specimen: BLOOD LEFT WRIST  Result Value Ref Range Status   Specimen Description   Final    BLOOD LEFT WRIST Performed at [facility], [address] [place], [place] [address]    Special Requests   Final    BOTTLES DRAWN AEROBIC AND ANAEROBIC Blood Culture adequate volume Performed at [facility], [address] [place], [place] [address]    Culture  Setup Time (A)  Final    YEAST AEROBIC BOTTLE ONLY CRITICAL RESULT CALLED TO, READ BACK BY AND VERIFIED WITH: NATHAN BELUE @ 04/30/2024 0216 AB Performed at [facility], [address] [place], [place] [address]    Culture YEAST (A)  Final   Report Status PENDING  Incomplete  Blood Culture ID Panel (Reflexed)     Status: Abnormal   Collection Time: 04/28/24  1:46 PM  Result Value Ref Range Status   Enterococcus faecalis NOT DETECTED NOT DETECTED Final   Enterococcus Faecium NOT DETECTED NOT DETECTED Final   Listeria monocytogenes NOT DETECTED NOT DETECTED Final   Staphylococcus species NOT DETECTED NOT DETECTED Final   Staphylococcus aureus (BCID) NOT DETECTED NOT DETECTED Final   Staphylococcus epidermidis NOT DETECTED NOT DETECTED Final   Staphylococcus lugdunensis NOT DETECTED NOT DETECTED Final   Streptococcus species NOT DETECTED NOT DETECTED Final   Streptococcus agalactiae NOT DETECTED NOT DETECTED Final   Streptococcus pneumoniae NOT DETECTED NOT DETECTED Final   Streptococcus pyogenes NOT DETECTED NOT DETECTED Final   A.calcoaceticus-baumannii NOT  DETECTED NOT DETECTED Final   Bacteroides fragilis NOT DETECTED NOT DETECTED Final   Enterobacterales NOT DETECTED NOT DETECTED Final   Enterobacter cloacae complex NOT DETECTED NOT DETECTED Final   Escherichia coli NOT DETECTED NOT DETECTED Final   Klebsiella aerogenes NOT DETECTED NOT DETECTED Final   Klebsiella oxytoca NOT DETECTED NOT DETECTED Final   Klebsiella pneumoniae NOT DETECTED NOT DETECTED Final   Proteus species NOT DETECTED NOT DETECTED Final   Salmonella species NOT DETECTED NOT DETECTED Final   Serratia marcescens NOT DETECTED NOT DETECTED Final   Haemophilus influenzae NOT DETECTED NOT DETECTED Final   Neisseria meningitidis NOT DETECTED NOT DETECTED Final   Pseudomonas aeruginosa NOT DETECTED NOT DETECTED Final   Stenotrophomonas maltophilia NOT DETECTED NOT DETECTED Final   Candida albicans DETECTED (A) NOT DETECTED Final    Comment: CRITICAL RESULT CALLED TO, READ BACK BY AND VERIFIED WITH: NATHAN BELUE @ 04/30/2024 0216 AB    Candida auris NOT DETECTED NOT DETECTED Final   Candida glabrata DETECTED (A) NOT DETECTED Final  Comment: CRITICAL RESULT CALLED TO, READ BACK BY AND VERIFIED WITH: NATHAN BELUE @ 04/30/2024 0216 AB    Candida krusei NOT DETECTED NOT DETECTED Final   Candida parapsilosis NOT DETECTED NOT DETECTED Final   Candida tropicalis NOT DETECTED NOT DETECTED Final   Cryptococcus neoformans/gattii NOT DETECTED NOT DETECTED Final    Comment: Performed at [facility], [address], [place], [place] [address]      Radiology Studies last 3 days: US  Abdomen Limited RUQ (LIVER/GB) Result Date: 04/28/2024 EXAM: Right Upper Quadrant Abdominal Ultrasound 04/28/2024 02:34:52 PM TECHNIQUE: Real-time ultrasonography of the right upper quadrant of the abdomen was performed. COMPARISON: CT abdomen and pelvis 04/28/2024. CLINICAL HISTORY: Elevated LFTs. FINDINGS: LIVER: The liver demonstrates normal echogenicity. No intrahepatic biliary ductal  dilatation. No evidence of mass. BILIARY SYSTEM: No pericholecystic fluid or wall thickening. No cholelithiasis. Common bile duct is within normal limits measuring 8 mm. OTHER: No right upper quadrant ascites. IMPRESSION: 1. No acute findings. 2. Common bile duct measures 8 mm which is within normal limits . Electronically signed by: Greig Pique MD 04/28/2024 10:02 PM EST RP Workstation: [id number]   CT LUMBAR SPINE WO CONTRAST Result Date: 04/28/2024 EXAM: CT THORACIC AND LUMBAR SPINE WITHOUT INTRAVENOUS CONTRAST 04/28/2024 05:08:00 PM TECHNIQUE: CT of the thoracic and lumbar spine was performed without the administration of intravenous contrast. Multiplanar reformatted images are provided for review. Automated exposure control, iterative reconstruction, and/or weight based adjustment of the mA/kV was utilized to reduce the radiation dose to as low as reasonably achievable. Incidental adrenal and/or renal findings do not require follow up imaging. COMPARISON: Thoracic and lumbar spine radiographs 04/28/2024. CT abdomen and pelvis 04/28/2024. CTA chest 09/04/2019. CLINICAL HISTORY: fall FINDINGS: THORACIC SPINE: BONES AND ALIGNMENT: Exaggerated upper thoracic kyphosis. Mild upper thoracic levoscoliosis and mid to lower thoracic dextroscoliosis. No significant listhesis. Oblique nondisplaced fracture through the anterior aspect of the T5 vertebral body and superior endplate with involvement of anterior and right lateral osteophytes, new from 2021 but of indeterminate age as some margins appear more sclerotic/chronic while others are less well defined. No definite associated middle or posterior column fracture. Chronic mild T12 superior endplate compression fracture, unchanged from 2021. Ankylosis by bridging vertebral osteophytes throughout the thoracic spine. Multilevel facet ankylosis as well. DEGENERATIVE CHANGES: No high-grade spinal canal stenosis. SOFT TISSUES: No acute abnormality. Aortic and coronary  atherosclerosis. LUMBAR SPINE: BONES AND ALIGNMENT: 5 lumbar type vertebrae. Trace anterolisthesis of L4 and L5. Horizontal fracture through the superior half of the L1 vertebral body with 7 mm of distraction and with a nondisplaced fracture extending through the T12 posterior elements bilaterally. Some of the fracture margins are mildly sclerotic, and this may be subacute. Widespread posterior element ankylosis in the lumbar spine. DEGENERATIVE CHANGES: At least mild spinal stenosis and mild to moderate left neural foraminal stenosis at L4-L5 due to disc bulging, endplate spurring, and severe facet hypertrophy. SOFT TISSUES: Likely mild paravertebral soft tissue swelling at L1. IMPRESSION: 1. Horizontal fracture through the superior half of the L1 vertebral body and T12 posterior elements, possibly subacute. 2. Oblique nondisplaced fracture through the anterior aspect of the T5 vertebral body, new from 2021 but of indeterminate age. 3. Widespread thoracic and lumbar ankylosis 4. Chronic T12 compression fracture. Electronically signed by: Dasie Hamburg MD 04/28/2024 06:37 PM EST RP Workstation: [id number]   CT THORACIC SPINE WO CONTRAST Result Date: 04/28/2024 EXAM: CT THORACIC AND LUMBAR SPINE WITHOUT INTRAVENOUS CONTRAST 04/28/2024 05:08:00 PM TECHNIQUE: CT of the thoracic and lumbar  spine was performed without the administration of intravenous contrast. Multiplanar reformatted images are provided for review. Automated exposure control, iterative reconstruction, and/or weight based adjustment of the mA/kV was utilized to reduce the radiation dose to as low as reasonably achievable. Incidental adrenal and/or renal findings do not require follow up imaging. COMPARISON: Thoracic and lumbar spine radiographs 04/28/2024. CT abdomen and pelvis 04/28/2024. CTA chest 09/04/2019. CLINICAL HISTORY: fall FINDINGS: THORACIC SPINE: BONES AND ALIGNMENT: Exaggerated upper thoracic kyphosis. Mild upper thoracic levoscoliosis  and mid to lower thoracic dextroscoliosis. No significant listhesis. Oblique nondisplaced fracture through the anterior aspect of the T5 vertebral body and superior endplate with involvement of anterior and right lateral osteophytes, new from 2021 but of indeterminate age as some margins appear more sclerotic/chronic while others are less well defined. No definite associated middle or posterior column fracture. Chronic mild T12 superior endplate compression fracture, unchanged from 2021. Ankylosis by bridging vertebral osteophytes throughout the thoracic spine. Multilevel facet ankylosis as well. DEGENERATIVE CHANGES: No high-grade spinal canal stenosis. SOFT TISSUES: No acute abnormality. Aortic and coronary atherosclerosis. LUMBAR SPINE: BONES AND ALIGNMENT: 5 lumbar type vertebrae. Trace anterolisthesis of L4 and L5. Horizontal fracture through the superior half of the L1 vertebral body with 7 mm of distraction and with a nondisplaced fracture extending through the T12 posterior elements bilaterally. Some of the fracture margins are mildly sclerotic, and this may be subacute. Widespread posterior element ankylosis in the lumbar spine. DEGENERATIVE CHANGES: At least mild spinal stenosis and mild to moderate left neural foraminal stenosis at L4-L5 due to disc bulging, endplate spurring, and severe facet hypertrophy. SOFT TISSUES: Likely mild paravertebral soft tissue swelling at L1. IMPRESSION: 1. Horizontal fracture through the superior half of the L1 vertebral body and T12 posterior elements, possibly subacute. 2. Oblique nondisplaced fracture through the anterior aspect of the T5 vertebral body, new from 2021 but of indeterminate age. 3. Widespread thoracic and lumbar ankylosis 4. Chronic T12 compression fracture. Electronically signed by: Dasie Hamburg MD 04/28/2024 06:37 PM EST RP Workstation: [id number]   CT CERVICAL SPINE WO CONTRAST Result Date: 04/28/2024 EXAM: CT CERVICAL SPINE WITHOUT CONTRAST  04/28/2024 05:08:00 PM TECHNIQUE: CT of the cervical spine was performed without the administration of intravenous contrast. Multiplanar reformatted images are provided for review. Automated exposure control, iterative reconstruction, and/or weight based adjustment of the mA/kV was utilized to reduce the radiation dose to as low as reasonably achievable. COMPARISON: CT cervical spine 04/30/2023 CLINICAL HISTORY: Fall FINDINGS: CERVICAL SPINE: BONES AND ALIGNMENT: Chronic straightening of the normal cervical lordosis. Slight left convex cervical curvature near the cervicothoracic junction. No significant listhesis. No acute fracture or suspicious lesion. DEGENERATIVE CHANGES: Spondylosis with large anterior vertebral osteophytes throughout the majority of the cervical spine. Advanced multilevel facet arthrosis. Potentially moderate multilevel spinal stenosis. Moderate to severe bilateral neural foraminal stenosis at C3-C4. SOFT TISSUES: No prevertebral soft tissue swelling. IMPRESSION: 1. No acute cervical spine fracture or traumatic malalignment. 2. Diffuse cervical spondylosis and facet arthrosis. Electronically signed by: Dasie Hamburg MD 04/28/2024 06:13 PM EST RP Workstation: [id number]   DG Thoracic Spine 2 View Result Date: 04/28/2024 CLINICAL DATA:  Multiple fall EXAM: THORACIC SPINE 2 VIEWS COMPARISON:  CT 04/28/2024, chest CT 09/04/2019 FINDINGS: Scoliosis of the thoracic spine. Moderate superior endplate deformity at T12, probably chronic to the chest CT from 2021 but lucencies on today's CT in the region of the spinous process. Lucency at the superior aspect of L1 vertebral body. Remaining vertebra demonstrate grossly normal stature. Multilevel flowing osteophytosis.  IMPRESSION: 1. Moderate superior endplate deformity at T12, probably chronic to the chest CT from 2021 2. Suspected acute fracture involving L1 and posterior elements at T12. Dedicated thoracolumbar CT recommended for further assessment  Electronically Signed   By: Luke Bun M.D.   On: 04/28/2024 16:14   DG Lumbar Spine 2-3 Views Result Date: 04/28/2024 CLINICAL DATA:  Multiple fall EXAM: LUMBAR SPINE - 2-3 VIEW COMPARISON:  CT 04/28/2024 FINDINGS: Lumbar alignment within normal limits. Aortic atherosclerosis. Abnormal lucency at the L1 vertebral body with superior endplate deformity. There is also additional superior endplate deformity at T12. On sagittal reconstructions of today CT, possible linear lucency at the spinous process of T12 and left posterior arch of T12. Moderate disc space narrowing L3-L4 and L5-S1. Prominent lower lumbar facet degenerative changes. IMPRESSION: 1. Abnormal lucency at the L1 vertebral body with superior endplate deformity, suspicious for acute fracture. Additional superior endplate deformity at T12, age indeterminate. Possible linear lucency at the spinous process of T12 and left posterior arch of T12, suspicious for acute fracture. Dedicated thoracolumbar CT recommended for further assessment. 2. Moderate degenerative changes of the lower lumbar spine. Electronically Signed   By: Luke Bun M.D.   On: 04/28/2024 16:12   DG Cervical Spine 2 or 3 views Result Date: 04/28/2024 CLINICAL DATA:  Multiple fall EXAM: CERVICAL SPINE - 2-3 VIEW COMPARISON:  04/12/2011, CT 01/28/2023 FINDINGS: Straightening of the cervical spine. Normal prevertebral soft tissue thickness. Dens and lateral masses are grossly within normal limits allowing for suboptimal positioning. Moderate diffuse disc space narrowing C3 through C7 with bulky multilevel osteophytes. IMPRESSION: Straightening of the cervical spine with moderate diffuse degenerative changes. Cross-sectional imaging follow-up if persistent concern for cervical fracture Electronically Signed   By: Luke Bun M.D.   On: 04/28/2024 16:05   DG HIPS BILAT WITH PELVIS MIN 5 VIEWS Result Date: 04/28/2024 CLINICAL DATA:  Recent fall concern for fracture EXAM: DG HIP  (WITH OR WITHOUT PELVIS) 5+V BILAT COMPARISON:  CT 04/28/2024 FINDINGS: SI joints are non widened. Pubic symphysis appears intact. No definitive fracture or malalignment. Prominent acetabular osteophytes. Mild bilateral hip degenerative change IMPRESSION: No acute osseous abnormality. Cross-sectional imaging follow-up if persistent concern for hip or pelvic fracture Electronically Signed   By: Luke Bun M.D.   On: 04/28/2024 16:03   CT HEAD WO CONTRAST ( ) Result Date: 04/28/2024 EXAM: CT HEAD WITHOUT 04/28/2024 03:06:26 PM TECHNIQUE: CT of the head was performed without the administration of intravenous contrast. Automated exposure control, iterative reconstruction, and/or weight based adjustment of the mA/kV was utilized to reduce the radiation dose to as low as reasonably achievable. COMPARISON: head CT 04/30/2023 CLINICAL HISTORY: Mental status change, unknown cause FINDINGS: BRAIN AND VENTRICLES: There is no evidence of an acute infarct, intracranial hemorrhage, mass, midline shift, hydrocephalus, or extra-axial fluid collection. Cerebral volume is normal for age. Cerebral white matter hypodensities are nonspecific but compatible with mild chronic small vessel ischemic disease. Calcified atherosclerosis at the skull base. ORBITS: Bilateral cataract extraction. SINUSES AND MASTOIDS: No acute abnormality. SOFT TISSUES AND SKULL: No acute skull fracture. No acute soft tissue abnormality. IMPRESSION: 1. No acute intracranial abnormality. 2. Mild chronic small vessel ischemic disease. Electronically signed by: Dasie Hamburg MD 04/28/2024 03:12 PM EST RP Workstation: [id number]   CT ABDOMEN PELVIS WO CONTRAST Result Date: 04/28/2024 CLINICAL DATA:  Diffuse abdominal pain.  Sepsis. EXAM: CT ABDOMEN AND PELVIS WITHOUT CONTRAST TECHNIQUE: Multidetector CT imaging of the abdomen and pelvis was performed following the standard protocol  without IV contrast. RADIATION DOSE REDUCTION: This exam was performed  according to the departmental dose-optimization program which includes automated exposure control, adjustment of the mA and/or kV according to patient size and/or use of iterative reconstruction technique. COMPARISON:  10/10/2010 FINDINGS: Lower chest: No acute findings. Hepatobiliary: No mass visualized on this unenhanced exam. Prior cholecystectomy. No evidence of biliary obstruction. Pancreas: No mass or inflammatory process visualized on this unenhanced exam. Spleen:  Within normal limits in size. Adrenals/Urinary tract: No evidence of urolithiasis. Mild left renal pyelectasis and ureterectasis is seen with gas in the renal collecting systems, most likely due to vesicoureteral reflux. Small amount of gas also seen in urinary bladder, likely from recent instrumentation. Stomach/Bowel: No evidence of small bowel obstruction, inflammatory process, or abnormal fluid collections. Diffuse gaseous distention of colon is seen with moderate stool burden is, which may be due to ileus or constipation. Vascular/Lymphatic: No pathologically enlarged lymph nodes identified. No evidence of abdominal aortic aneurysm. Reproductive: Prior hysterectomy noted. Adnexal regions are unremarkable in appearance. Other:  None. Musculoskeletal:  No suspicious bone lesions identified. IMPRESSION: Mild left renal pyelectasis and ureterectasis with gas in the renal collecting system, most likely due to vesicoureteral reflux. No evidence of ureteral calculus. Gas in urinary bladder. Recommend clinical correlation for recent instrumentation. Diffuse gaseous distention of colon with moderate stool burden, which may be due to ileus or constipation. Electronically Signed   By: Norleen DELENA Kil M.D.   On: 04/28/2024 11:15   DG Chest Port 1 View Result Date: 04/28/2024 CLINICAL DATA:  Questionable sepsis.  Tachycardia. EXAM: PORTABLE CHEST 1 VIEW COMPARISON:  04/30/2023 FINDINGS: Rotated film. The cardio pericardial silhouette is enlarged.  Atelectasis or infiltrate noted at the right base. Soft tissue fullness noted in the right hilar region with volume loss noted right hemithorax, likely accentuated by rotation. Bones are diffusely demineralized with degenerative changes in each shoulder. IMPRESSION: 1. Atelectasis or infiltrate at the right base. 2. Soft tissue fullness in the right hilar region with volume loss in the right hemithorax, likely accentuated by rotation. Dedicated upright PA and lateral chest x-ray recommended when patient is able. Electronically Signed   By: Camellia Candle M.D.   On: 04/28/2024 08:55         Shakaya Bhullar, DO [facility] 05/01/2024, 6:44 PM    Dictation software may have been used to generate the above note. Typos may occur and escape review in typed/dictated notes. Please contact Dr Marsa directly for clarity if needed.  Staff may message me via secure chat in Epic  but this may not receive an immediate response,  please page me for urgent matters!  If 7PM-7AM, please contact night coverage [email or web address]

## 2024-05-01 NOTE — TOC Initial Note (Signed)
 Transition of Care [facility]) - Initial/Assessment Note    Patient Details  Name: Cynthia Singh MRN: [id number] Date of Birth: 1941-02-11  Transition of Care [facility]) CM/SW Contact:    Shasta DELENA Daring, RN Phone Number: 05/01/2024, 11:12 AM  Clinical Narrative:                 RNCM assess patient. She as alone at the time. Reclining in bed. Confirmed orientation x4.  Patient lives with her 2 adult sons in a single family home. PCP at [facility], but said her provider is new and she could not remember the name. She has family who she can call when she needs a ride to an appointment. [facility]. Says she does sometimes have difficulty affording medications, but denies ever having missed doses for that reason. Plans to call her niece, Bri to pick her up if she is discharged home.   Patient is amenable to SNF for rehab, but is concerned about the cost. [place] we can proceed with a bed search, but she wants to know more about the cost before she commits. Patient states, I don't have any extra money.  Patient denied ever being in a SNF before. Advised I would do more research on what her costs would be and would let her know.  Will proceed with FL2 and search.        Patient Goals and CMS Choice            Expected Discharge Plan and Services                                              Prior Living Arrangements/Services     Patient language and need for interpreter reviewed:: Yes Do you feel safe going back to the place where you live?: Yes      Need for Family Participation in Patient Care: Yes (Comment) Care giver support system in place?: Yes (comment)   Criminal Activity/Legal Involvement Pertinent to Current Situation/Hospitalization: No - Comment as needed  Activities of Daily Living      Permission Sought/Granted Permission sought to share information with : Case Manager, Magazine Features Editor Permission granted to share information  with : Yes, Verbal Permission Granted              Emotional Assessment Appearance:: Appears stated age Attitude/Demeanor/Rapport: Gracious, Engaged Affect (typically observed): Appropriate Orientation: : Oriented to Self, Oriented to Place, Oriented to  Time, Oriented to Situation Alcohol / Substance Use: Not Applicable    Admission diagnosis:  Pyelonephritis [N12] Type 2 diabetes mellitus without complication, with long-term current use of insulin  (HCC) [E11.9, Z79.4] Sepsis due to gram-negative UTI (HCC) [A41.50, N39.0] Sepsis with acute renal failure and septic shock, due to unspecified organism, unspecified acute renal failure type (HCC) [A41.9, R65.21, N17.9] Patient Active Problem List   Diagnosis Date Noted   Sepsis due to gram-negative UTI (HCC) 04/28/2024   Left shoulder pain 02/16/2024   Stage 3a chronic kidney disease (HCC) 11/16/2023   Diabetes mellitus treated with insulin  and oral medication (HCC) 05/10/2023   Vertigo 01/05/2023   GERD (gastroesophageal reflux disease) 07/14/2021   Peripheral vascular disease, unspecified 2020   Chronic narcotic dependence (HCC) 10/29/2016   CAD (coronary artery disease) 07/19/2016   Preventative health care 04/30/2014   Advanced directives, counseling/discussion 04/30/2014   Atherosclerotic heart disease of  native coronary artery with angina pectoris 10/21/2012   KNEE PAIN, LEFT, CHRONIC 02/25/2010   Type 2 diabetes mellitus (HCC) 12/30/2009   Hyperlipidemia associated with type 2 diabetes mellitus (HCC) 12/30/2009   Mood disorder 12/30/2009   Hypertension associated with type 2 diabetes mellitus (HCC) 12/30/2009   Chronic back pain 12/30/2009   PCP:  Wendee Lynwood HERO, NP Pharmacy:   [phone] [place], [place] - [address] [place] [address] Phone: [phone] Fax: [phone]  [id number] [place], [place] - [address]  [place] [address] Phone: [phone] Fax: [phone]  [id number] [place], [place] - [address] [place] [address] Phone: [phone] Fax: [phone]  [facility] - [place], [place] - [address] [place] [address] Phone: [phone] Fax: [phone]     Social Drivers of Health (SDOH) Social History: SDOH Screenings   Food Insecurity: No Food Insecurity (04/29/2024)  Housing: Low Risk (04/29/2024)  Transportation Needs: No Transportation Needs (04/29/2024)  Utilities: Not At Risk (04/29/2024)  Depression (PHQ2-9): Medium Risk (02/16/2024)  Financial Resource Strain: Medium Risk (06/24/2021)  Social Connections: Socially Isolated (04/29/2024)  Tobacco Use: Low Risk (04/28/2024)   SDOH Interventions:     Readmission Risk Interventions    05/01/2024   11:10 AM 05/02/2023   11:52 AM  Readmission Risk Prevention Plan  Transportation Screening Complete Complete  PCP or Specialist Appt within 5-7 Days  Complete  PCP or Specialist Appt within 3-5 Days Complete   Home Care Screening  Complete  Medication Review (RN CM)  Complete  HRI or Home Care Consult Complete   Palliative Care Screening Not Applicable   Medication Review (RN Care Manager) Complete

## 2024-05-01 NOTE — Telephone Encounter (Signed)
 Patient was seen while in the hospital by Dr. Deatrice as a consult.   Per Dr. Deatrice:  82RHF PMH sig for DM, hypertension, GERD, CKD p/w nausea/vomitting found to have pyelonephritis/UTI ?urosepsis, CT pand scan showed horizontal likely old appearing fracture through the superior half of the L1 vertebral body and T12 laminar/sinous process fracture possibly subacute, oblique nondisplaced fracture through the anterior aspect of the T5 vertebral body, new from 2021 age indeterm. She was difficult to engage in exam because she was ill but to best I can tell is full strength and sensation, normal reflexes.  PLAN:TLSO when OOB, ok to shower without brace Will need outpatient followup   Scheduled new patient appointment for follow up with Brooke on 05/29/24 at 3pm.

## 2024-05-02 ENCOUNTER — Inpatient Hospital Stay (HOSPITAL_COMMUNITY)
Admit: 2024-05-02 | Discharge: 2024-05-02 | Disposition: A | Discharge status: Home / Self Care | Attending: Infectious Diseases | Admitting: Infectious Diseases

## 2024-05-02 DIAGNOSIS — R7989 Other specified abnormal findings of blood chemistry: Secondary | ICD-10-CM

## 2024-05-02 DIAGNOSIS — I342 Nonrheumatic mitral (valve) stenosis: Secondary | ICD-10-CM

## 2024-05-02 LAB — HEPATIC FUNCTION PANEL
ALT: 13 U/L (ref 0–44)
AST: 20 U/L (ref 15–41)
Albumin: 2.5 g/dL — ABNORMAL LOW (ref 3.5–5.0)
Alkaline Phosphatase: 148 U/L — ABNORMAL HIGH (ref 38–126)
Bilirubin, Direct: 0.2 mg/dL (ref 0.0–0.2)
Indirect Bilirubin: 0.2 mg/dL — ABNORMAL LOW (ref 0.3–0.9)
Total Bilirubin: 0.3 mg/dL (ref 0.0–1.2)
Total Protein: 5.8 g/dL — ABNORMAL LOW (ref 6.5–8.1)

## 2024-05-02 LAB — BASIC METABOLIC PANEL WITH GFR
Anion gap: 15 (ref 5–15)
Anion gap: 12 (ref 5–15)
BUN: 50 mg/dL — ABNORMAL HIGH (ref 8–23)
BUN: 46 mg/dL — ABNORMAL HIGH (ref 8–23)
CO2: 17 mmol/L — ABNORMAL LOW (ref 22–32)
CO2: 20 mmol/L — ABNORMAL LOW (ref 22–32)
Calcium: 7.9 mg/dL — ABNORMAL LOW (ref 8.9–10.3)
Calcium: 8.2 mg/dL — ABNORMAL LOW (ref 8.9–10.3)
Chloride: 98 mmol/L (ref 98–111)
Chloride: 96 mmol/L — ABNORMAL LOW (ref 98–111)
Creatinine, Ser: 1.77 mg/dL — ABNORMAL HIGH (ref 0.44–1.00)
Creatinine, Ser: 1.66 mg/dL — ABNORMAL HIGH (ref 0.44–1.00)
GFR, Estimated: 29 mL/min — ABNORMAL LOW (ref >60)
GFR, Estimated: 31 mL/min — ABNORMAL LOW (ref >60)
Glucose, Bld: 114 mg/dL — ABNORMAL HIGH (ref 70–99)
Glucose, Bld: 151 mg/dL — ABNORMAL HIGH (ref 70–99)
Potassium: 3.9 mmol/L (ref 3.5–5.1)
Potassium: 3.9 mmol/L (ref 3.5–5.1)
Sodium: 129 mmol/L — ABNORMAL LOW (ref 135–145)
Sodium: 128 mmol/L — ABNORMAL LOW (ref 135–145)

## 2024-05-02 LAB — ECHOCARDIOGRAM COMPLETE
AR max vel: 2.36 cm2
AV Area VTI: 2.22 cm2
AV Area mean vel: 2.26 cm2
AV Mean grad: 6 mmHg
AV Peak grad: 11.6 mmHg
Ao pk vel: 1.7 m/s
Area-P 1/2: 4.29 cm2
Height: 68 in
S' Lateral: 3 cm
Weight: 2878.33 [oz_av]

## 2024-05-02 LAB — CBC
HCT: 32.2 % — ABNORMAL LOW (ref 36.0–46.0)
Hemoglobin: 10.6 g/dL — ABNORMAL LOW (ref 12.0–15.0)
MCH: 27.9 pg (ref 26.0–34.0)
MCHC: 32.9 g/dL (ref 30.0–36.0)
MCV: 84.7 fL (ref 80.0–100.0)
Platelets: 157 K/uL (ref 150–400)
RBC: 3.8 MIL/uL — ABNORMAL LOW (ref 3.87–5.11)
RDW: 15.4 % (ref 11.5–15.5)
WBC: 11.6 K/uL — ABNORMAL HIGH (ref 4.0–10.5)
nRBC: 0 % (ref 0.0–0.2)

## 2024-05-02 LAB — CREATININE, URINE, RANDOM: Creatinine, Urine: 22 mg/dL

## 2024-05-02 LAB — OSMOLALITY, URINE: Osmolality, Ur: 330 mosm/kg (ref 300–900)

## 2024-05-02 LAB — SODIUM: Sodium: 126 mmol/L — ABNORMAL LOW (ref 135–145)

## 2024-05-02 LAB — OSMOLALITY: Osmolality: 287 mosm/kg (ref 275–295)

## 2024-05-02 MED ORDER — POLYETHYLENE GLYCOL 3350 17 G PO PACK
17.0000 g | PACK | Freq: Every day | ORAL | Status: DC
  Administered 2024-05-02 – 2024-05-04 (×3): 17 g via ORAL
  Filled 2024-05-02 (×2): qty 1

## 2024-05-02 MED ORDER — SODIUM CHLORIDE 0.9 % IV SOLN: INTRAVENOUS | Status: AC

## 2024-05-02 MED ORDER — PERFLUTREN LIPID MICROSPHERE
1.0000 mL | INTRAVENOUS | Status: AC | PRN
  Administered 2024-05-02: 10:00:00 3 mL via INTRAVENOUS

## 2024-05-02 MED ORDER — SENNOSIDES-DOCUSATE SODIUM 8.6-50 MG PO TABS
2.0000 | ORAL_TABLET | Freq: Every evening | ORAL | Status: DC | PRN
  Administered 2024-05-02: 22:00:00 2 via ORAL
  Filled 2024-05-02: qty 2

## 2024-05-02 MED ORDER — OXYCODONE HCL 5 MG PO TABS
5.0000 mg | ORAL_TABLET | ORAL | Status: DC | PRN
  Administered 2024-05-02 – 2024-05-04 (×6): 5 mg via ORAL
  Filled 2024-05-02 (×6): qty 1

## 2024-05-02 NOTE — Progress Notes (Signed)
 INFECTIOUS DISEASE PROGRESS NOTE Date of Admission:  04/28/2024     ID: Cynthia Singh is a 83 y.o. female with E coli and candidemia Principal Problem:   Sepsis due to gram-negative UTI [facility]) Active Problems:   Type 2 diabetes mellitus (HCC)   Hyperlipidemia associated with type 2 diabetes mellitus (HCC)   CAD (coronary artery disease)   GERD (gastroesophageal reflux disease)   Stage 3a chronic kidney disease (HCC)   Subjective: No fevers. White blood count 6 6.  ROS  Unable to obtain  Medications:  Antibiotics Given (last 72 hours)     Date/Time Action Medication Dose Rate   04/29/24 1518 New Bag/Given   cefTRIAXone  (ROCEPHIN ) 2 g in sodium chloride  0.9 % 100 mL IVPB 2 g 200 mL/hr   04/30/24 1600 New Bag/Given   cefTRIAXone  (ROCEPHIN ) 2 g in sodium chloride  0.9 % 100 mL IVPB 2 g 200 mL/hr   05/01/24 1737 New Bag/Given   cefTRIAXone  (ROCEPHIN ) 2 g in sodium chloride  0.9 % 100 mL IVPB 2 g 200 mL/hr       amLODipine   2.5 mg Oral Daily   aspirin  EC  81 mg Oral Daily   clopidogrel   75 mg Oral Daily   heparin   5,000 Units Subcutaneous Q8H   nortriptyline   150 mg Oral QHS   polyethylene glycol  17 g Oral Daily   sodium chloride  flush  3 mL Intravenous Q12H    Objective: Vital signs in last 24 hours: Temp:  [98 F (36.7 C)-99.1 F (37.3 C)] 98 F (36.7 C) (12/16 1146) Pulse Rate:  [86-96] 88 (12/16 1146) Resp:  [16-20] 16 (12/16 1146) BP: (120-171)/(58-86) 156/70 (12/16 1146) SpO2:  [91 %-98 %] 95 % (12/16 0752) Weight:  [81.6 kg] 81.6 kg (12/16 0500) Physical Exam  Constitutional:  more interactive today, lying in bed, able to answer questions HENT: [place]/AT, PERRLA, no scleral icterus Mouth/Throat: Oropharynx is clear  Cardiovascular: Normal rate, regular rhythm and normal heart sounds.  Pulmonary/Chest: Effort normal and breath sounds normal. No respiratory distress.  has no wheezes.  Neck = supple, no nuchal rigidity Abdominal: Soft. Bowel sounds are normal.   exhibits no distension. There is no tenderness.  Lymphadenopathy: no cervical adenopathy. No axillary adenopathy Neurological: interactive  Skin: Skin is warm and dry. No rash noted. No erythema.  Psychiatric: a normal mood and affect.  behavior is normal.    Lab Results Recent Labs    05/01/24 0420 05/02/24 0357  WBC 12.0* 11.6*  HGB 10.9* 10.6*  HCT 33.0* 32.2*  NA 134* 129*  K 3.6 3.9  CL 103 98  CO2 17* 17*  BUN 56* 50*  CREATININE 1.96* 1.77*    Microbiology: Results for orders placed or performed during the hospital encounter of 04/28/24  Resp panel by RT-PCR (RSV, Flu A&B, Covid) Anterior Nasal Swab     Status: None   Collection Time: 04/28/24  8:30 AM   Specimen: Anterior Nasal Swab  Result Value Ref Range Status   SARS Coronavirus 2 by RT PCR NEGATIVE NEGATIVE Final    Comment: (NOTE) SARS-CoV-2 target nucleic acids are NOT DETECTED.  The SARS-CoV-2 RNA is generally detectable in upper respiratory specimens during the acute phase of infection. The lowest concentration of SARS-CoV-2 viral copies this assay can detect is 138 copies/mL. A negative result does not preclude SARS-Cov-2 infection and should not be used as the sole basis for treatment or other patient management decisions. A negative result may occur with  improper  specimen collection/handling, submission of specimen other than nasopharyngeal swab, presence of viral mutation(s) within the areas targeted by this assay, and inadequate number of viral copies(<138 copies/mL). A negative result must be combined with clinical observations, patient history, and epidemiological information. The expected result is Negative.  Fact Sheet for Patients:  bloggercourse.com  Fact Sheet for Healthcare Providers:  seriousbroker.it  This test is no t yet approved or cleared by the [place]  FDA and  has been authorized for detection and/or diagnosis of SARS-CoV-2  by FDA under an Emergency Use Authorization (EUA). This EUA will remain  in effect (meaning this test can be used) for the duration of the COVID-19 declaration under Section 564(b)(1) of the Act, 21 U.S.C.section 360bbb-3(b)(1), unless the authorization is terminated  or revoked sooner.       Influenza A by PCR NEGATIVE NEGATIVE Final   Influenza B by PCR NEGATIVE NEGATIVE Final    Comment: (NOTE) The Xpert Xpress SARS-CoV-2/FLU/RSV plus assay is intended as an aid in the diagnosis of influenza from Nasopharyngeal swab specimens and should not be used as a sole basis for treatment. Nasal washings and aspirates are unacceptable for Xpert Xpress SARS-CoV-2/FLU/RSV testing.  Fact Sheet for Patients: bloggercourse.com  Fact Sheet for Healthcare Providers: seriousbroker.it  This test is not yet approved or cleared by the [place]  FDA and has been authorized for detection and/or diagnosis of SARS-CoV-2 by FDA under an Emergency Use Authorization (EUA). This EUA will remain in effect (meaning this test can be used) for the duration of the COVID-19 declaration under Section 564(b)(1) of the Act, 21 U.S.C. section 360bbb-3(b)(1), unless the authorization is terminated or revoked.     Resp Syncytial Virus by PCR NEGATIVE NEGATIVE Final    Comment: (NOTE) Fact Sheet for Patients: bloggercourse.com  Fact Sheet for Healthcare Providers: seriousbroker.it  This test is not yet approved or cleared by the [place]  FDA and has been authorized for detection and/or diagnosis of SARS-CoV-2 by FDA under an Emergency Use Authorization (EUA). This EUA will remain in effect (meaning this test can be used) for the duration of the COVID-19 declaration under Section 564(b)(1) of the Act, 21 U.S.C. section 360bbb-3(b)(1), unless the authorization is terminated or revoked.  Performed at  [facility], [address] [place], [place] [address]   Blood Culture (routine x 2)     Status: Abnormal   Collection Time: 04/28/24  8:38 AM   Specimen: BLOOD  Result Value Ref Range Status   Specimen Description   Final    BLOOD BLOOD LEFT ARM Performed at [facility], [address] [place], [place] [address]    Special Requests   Final    BOTTLES DRAWN AEROBIC AND ANAEROBIC Blood Culture adequate volume Performed at [facility] [address] [place], [place] [address]    Culture  Setup Time   Final    IN BOTH AEROBIC AND ANAEROBIC BOTTLES GRAM NEGATIVE RODS CRITICAL RESULT CALLED TO, READ BACK BY AND VERIFIED WITH: NATHAN BELUE @ 04/28/2024 2243 AB Performed at [facility], [address], [place], [place] [address]    Culture ESCHERICHIA COLI (A)  Final   Report Status 05/01/2024 FINAL  Final   Organism ID, Bacteria ESCHERICHIA COLI  Final      Susceptibility   Escherichia coli - MIC*    AMPICILLIN >=32 RESISTANT Resistant     CEFAZOLIN (NON-URINE) 8 RESISTANT Resistant     CEFEPIME  <=0.12 SENSITIVE Sensitive  ERTAPENEM <=0.12 SENSITIVE Sensitive     CEFTRIAXONE  <=0.25 SENSITIVE Sensitive     CIPROFLOXACIN  <=0.06 SENSITIVE Sensitive     GENTAMICIN <=1 SENSITIVE Sensitive     MEROPENEM <=0.25 SENSITIVE Sensitive     TRIMETH /SULFA  <=20 SENSITIVE Sensitive     AMPICILLIN/SULBACTAM >=32 RESISTANT Resistant     PIP/TAZO Value in next row Sensitive      <=4 SENSITIVEThis is a modified FDA-approved test that has been validated and its performance characteristics determined by the reporting laboratory.  This laboratory is certified under the Clinical Laboratory Improvement Amendments CLIA as qualified to perform high complexity clinical laboratory testing.    * ESCHERICHIA COLI  Blood Culture ID Panel (Reflexed)     Status: Abnormal   Collection Time: 04/28/24  8:38 AM  Result Value Ref Range Status   Enterococcus faecalis NOT  DETECTED NOT DETECTED Final   Enterococcus Faecium NOT DETECTED NOT DETECTED Final   Listeria monocytogenes NOT DETECTED NOT DETECTED Final   Staphylococcus species NOT DETECTED NOT DETECTED Final   Staphylococcus aureus (BCID) NOT DETECTED NOT DETECTED Final   Staphylococcus epidermidis NOT DETECTED NOT DETECTED Final   Staphylococcus lugdunensis NOT DETECTED NOT DETECTED Final   Streptococcus species NOT DETECTED NOT DETECTED Final   Streptococcus agalactiae NOT DETECTED NOT DETECTED Final   Streptococcus pneumoniae NOT DETECTED NOT DETECTED Final   Streptococcus pyogenes NOT DETECTED NOT DETECTED Final   A.calcoaceticus-baumannii NOT DETECTED NOT DETECTED Final   Bacteroides fragilis NOT DETECTED NOT DETECTED Final   Enterobacterales DETECTED (A) NOT DETECTED Final    Comment: Enterobacterales represent a large order of gram negative bacteria, not a single organism. CRITICAL RESULT CALLED TO, READ BACK BY AND VERIFIED WITH: NATHAN BELUE @ 04/28/2024 2243 AB    Enterobacter cloacae complex NOT DETECTED NOT DETECTED Final   Escherichia coli DETECTED (A) NOT DETECTED Final    Comment: CRITICAL RESULT CALLED TO, READ BACK BY AND VERIFIED WITH: NATHAN BELUE @ 04/28/2024 2243 AB    Klebsiella aerogenes NOT DETECTED NOT DETECTED Final   Klebsiella oxytoca NOT DETECTED NOT DETECTED Final   Klebsiella pneumoniae NOT DETECTED NOT DETECTED Final   Proteus species NOT DETECTED NOT DETECTED Final   Salmonella species NOT DETECTED NOT DETECTED Final   Serratia marcescens NOT DETECTED NOT DETECTED Final   Haemophilus influenzae NOT DETECTED NOT DETECTED Final   Neisseria meningitidis NOT DETECTED NOT DETECTED Final   Pseudomonas aeruginosa NOT DETECTED NOT DETECTED Final   Stenotrophomonas maltophilia NOT DETECTED NOT DETECTED Final   Candida albicans NOT DETECTED NOT DETECTED Final   Candida auris NOT DETECTED NOT DETECTED Final   Candida glabrata NOT DETECTED NOT DETECTED Final   Candida  krusei NOT DETECTED NOT DETECTED Final   Candida parapsilosis NOT DETECTED NOT DETECTED Final   Candida tropicalis NOT DETECTED NOT DETECTED Final   Cryptococcus neoformans/gattii NOT DETECTED NOT DETECTED Final   CTX-M ESBL NOT DETECTED NOT DETECTED Final   Carbapenem resistance IMP NOT DETECTED NOT DETECTED Final   Carbapenem resistance KPC NOT DETECTED NOT DETECTED Final   Carbapenem resistance NDM NOT DETECTED NOT DETECTED Final   Carbapenem resist OXA 48 LIKE NOT DETECTED NOT DETECTED Final   Carbapenem resistance VIM NOT DETECTED NOT DETECTED Final    Comment: Performed at [facility], [address] [place], [place] [address]  Urine Culture (for pregnant, neutropenic or urologic patients or patients with an indwelling urinary catheter)     Status: Abnormal   Collection Time: 04/28/24 11:03 AM  Specimen: Urine, Clean Catch  Result Value Ref Range Status   Specimen Description   Final    URINE, CLEAN CATCH Performed at [facility], [address] [place], [place] [address]    Special Requests   Final    NONE Performed at [facility], [address] [place], [place] [address]    Culture 50,000 COLONIES/mL ESCHERICHIA COLI (A)  Final   Report Status 04/30/2024 FINAL  Final   Organism ID, Bacteria ESCHERICHIA COLI (A)  Final      Susceptibility   Escherichia coli - MIC*    AMPICILLIN >=32 RESISTANT Resistant     CEFAZOLIN (URINE) Value in next row Sensitive      8 SENSITIVEThis is a modified FDA-approved test that has been validated and its performance characteristics determined by the reporting laboratory.  This laboratory is certified under the Clinical Laboratory Improvement Amendments CLIA as qualified to perform high complexity clinical laboratory testing.    CEFEPIME  Value in next row Sensitive      8 SENSITIVEThis is a modified FDA-approved test that has been validated and its performance characteristics determined by the reporting  laboratory.  This laboratory is certified under the Clinical Laboratory Improvement Amendments CLIA as qualified to perform high complexity clinical laboratory testing.    ERTAPENEM Value in next row Sensitive      8 SENSITIVEThis is a modified FDA-approved test that has been validated and its performance characteristics determined by the reporting laboratory.  This laboratory is certified under the Clinical Laboratory Improvement Amendments CLIA as qualified to perform high complexity clinical laboratory testing.    CEFTRIAXONE  Value in next row Sensitive      8 SENSITIVEThis is a modified FDA-approved test that has been validated and its performance characteristics determined by the reporting laboratory.  This laboratory is certified under the Clinical Laboratory Improvement Amendments CLIA as qualified to perform high complexity clinical laboratory testing.    CIPROFLOXACIN  Value in next row Sensitive      8 SENSITIVEThis is a modified FDA-approved test that has been validated and its performance characteristics determined by the reporting laboratory.  This laboratory is certified under the Clinical Laboratory Improvement Amendments CLIA as qualified to perform high complexity clinical laboratory testing.    GENTAMICIN Value in next row Sensitive      8 SENSITIVEThis is a modified FDA-approved test that has been validated and its performance characteristics determined by the reporting laboratory.  This laboratory is certified under the Clinical Laboratory Improvement Amendments CLIA as qualified to perform high complexity clinical laboratory testing.    NITROFURANTOIN Value in next row Sensitive      8 SENSITIVEThis is a modified FDA-approved test that has been validated and its performance characteristics determined by the reporting laboratory.  This laboratory is certified under the Clinical Laboratory Improvement Amendments CLIA as qualified to perform high complexity clinical laboratory testing.     TRIMETH /SULFA  Value in next row Sensitive      8 SENSITIVEThis is a modified FDA-approved test that has been validated and its performance characteristics determined by the reporting laboratory.  This laboratory is certified under the Clinical Laboratory Improvement Amendments CLIA as qualified to perform high complexity clinical laboratory testing.    AMPICILLIN/SULBACTAM Value in next row Intermediate      8 SENSITIVEThis is a modified FDA-approved test that has been validated and its performance characteristics determined by the reporting laboratory.  This laboratory is certified under the Clinical Laboratory Improvement Amendments CLIA as qualified to  perform high complexity clinical laboratory testing.    PIP/TAZO Value in next row Sensitive      <=4 SENSITIVEThis is a modified FDA-approved test that has been validated and its performance characteristics determined by the reporting laboratory.  This laboratory is certified under the Clinical Laboratory Improvement Amendments CLIA as qualified to perform high complexity clinical laboratory testing.    MEROPENEM Value in next row Sensitive      <=4 SENSITIVEThis is a modified FDA-approved test that has been validated and its performance characteristics determined by the reporting laboratory.  This laboratory is certified under the Clinical Laboratory Improvement Amendments CLIA as qualified to perform high complexity clinical laboratory testing.    * 50,000 COLONIES/mL ESCHERICHIA COLI  Blood Culture (routine x 2)     Status: Abnormal (Preliminary result)   Collection Time: 04/28/24  1:46 PM   Specimen: BLOOD LEFT WRIST  Result Value Ref Range Status   Specimen Description   Final    BLOOD LEFT WRIST Performed at [facility], [address] [place], [place] [address]    Special Requests   Final    BOTTLES DRAWN AEROBIC AND ANAEROBIC Blood Culture adequate volume Performed at [facility], [address] [place], [place]  [address]    Culture  Setup Time (A)  Final    YEAST AEROBIC BOTTLE ONLY CRITICAL RESULT CALLED TO, READ BACK BY AND VERIFIED WITH: NATHAN BELUE @ 04/30/2024 0216 AB Performed at [facility], [address], [place], [place] [address]    Culture CANDIDA ALBICANS (A)  Final   Report Status PENDING  Incomplete  Blood Culture ID Panel (Reflexed)     Status: Abnormal   Collection Time: 04/28/24  1:46 PM  Result Value Ref Range Status   Enterococcus faecalis NOT DETECTED NOT DETECTED Final   Enterococcus Faecium NOT DETECTED NOT DETECTED Final   Listeria monocytogenes NOT DETECTED NOT DETECTED Final   Staphylococcus species NOT DETECTED NOT DETECTED Final   Staphylococcus aureus (BCID) NOT DETECTED NOT DETECTED Final   Staphylococcus epidermidis NOT DETECTED NOT DETECTED Final   Staphylococcus lugdunensis NOT DETECTED NOT DETECTED Final   Streptococcus species NOT DETECTED NOT DETECTED Final   Streptococcus agalactiae NOT DETECTED NOT DETECTED Final   Streptococcus pneumoniae NOT DETECTED NOT DETECTED Final   Streptococcus pyogenes NOT DETECTED NOT DETECTED Final   A.calcoaceticus-baumannii NOT DETECTED NOT DETECTED Final   Bacteroides fragilis NOT DETECTED NOT DETECTED Final   Enterobacterales NOT DETECTED NOT DETECTED Final   Enterobacter cloacae complex NOT DETECTED NOT DETECTED Final   Escherichia coli NOT DETECTED NOT DETECTED Final   Klebsiella aerogenes NOT DETECTED NOT DETECTED Final   Klebsiella oxytoca NOT DETECTED NOT DETECTED Final   Klebsiella pneumoniae NOT DETECTED NOT DETECTED Final   Proteus species NOT DETECTED NOT DETECTED Final   Salmonella species NOT DETECTED NOT DETECTED Final   Serratia marcescens NOT DETECTED NOT DETECTED Final   Haemophilus influenzae NOT DETECTED NOT DETECTED Final   Neisseria meningitidis NOT DETECTED NOT DETECTED Final   Pseudomonas aeruginosa NOT DETECTED NOT DETECTED Final   Stenotrophomonas maltophilia NOT DETECTED NOT DETECTED  Final   Candida albicans DETECTED (A) NOT DETECTED Final    Comment: CRITICAL RESULT CALLED TO, READ BACK BY AND VERIFIED WITH: NATHAN BELUE @ 04/30/2024 0216 AB    Candida auris NOT DETECTED NOT DETECTED Final   Candida glabrata DETECTED (A) NOT DETECTED Final    Comment: CRITICAL RESULT CALLED TO, READ BACK BY AND  VERIFIED WITH: NATHAN BELUE @ 04/30/2024 0216 AB    Candida krusei NOT DETECTED NOT DETECTED Final   Candida parapsilosis NOT DETECTED NOT DETECTED Final   Candida tropicalis NOT DETECTED NOT DETECTED Final   Cryptococcus neoformans/gattii NOT DETECTED NOT DETECTED Final    Comment: Performed at [facility], [address] [place], [place] [address]  Culture, blood (Routine X 2) w Reflex to ID Panel     Status: None (Preliminary result)   Collection Time: 05/01/24  3:16 PM   Specimen: BLOOD  Result Value Ref Range Status   Specimen Description BLOOD BLOOD LEFT ARM  Final   Special Requests   Final    BOTTLES DRAWN AEROBIC AND ANAEROBIC Blood Culture adequate volume   Culture   Final    NO GROWTH < 24 HOURS Performed at [facility], [address], [place], [place] [address]    Report Status PENDING  Incomplete  Culture, blood (Routine X 2) w Reflex to ID Panel     Status: None (Preliminary result)   Collection Time: 05/01/24  3:17 PM   Specimen: BLOOD  Result Value Ref Range Status   Specimen Description BLOOD BLOOD LEFT HAND  Final   Special Requests AEROBIC BOTTLE ONLY Blood Culture adequate volume  Final   Culture   Final    NO GROWTH < 24 HOURS Performed at [facility], [address], [place], [place] [address]    Report Status PENDING  Incomplete    Studies/Results: No results found.  Assessment/Plan: Cynthia Singh is a 83 y.o. female with multiple medical problems admitted with NV weakness and found to have urosepsis with E coli in blood and urine and surprising 2 forms of candida on one set of BCX.    May 02, 2024- clinically improving.  Day 5 of  antibiotic treatment.  Day 3 of fluconazole . Recommendations Urosepsis with E. coli in urine and blood-clinically improving.  She had some gas noted in her urinary system. Continue ceftriaxone  for now but can transition to orals as she improves   Positive blood culture for Candida 2 types-I suspect this is contaminant.  Unfortunately she did not have blood cultures repeated prior to starting antifungals. She is not a typical patient for candidemia with no indwelling lines or pacemakers Echo pending   Thank you very much for the consult. Will follow with you.  Cynthia Singh   05/02/2024, 2:46 PM

## 2024-05-02 NOTE — TOC Progression Note (Addendum)
 Transition of Care [facility]) - Progression Note    Patient Details  Name: Cynthia Singh MRN: [id number] Date of Birth: 1940-06-11  Transition of [facility] CM/SW Contact  Shasta DELENA Daring, RN Phone Number: 05/02/2024, 10:51 AM  Clinical Narrative:     SNF search initiated. Will follow for offers.  3:29 PM  Patient accepted bed offer from [facility].Linked in hub and notified AD.  Will get auth near discharge.                      Expected Discharge Plan and Services                                               Social Drivers of Health (SDOH) Interventions SDOH Screenings   Food Insecurity: No Food Insecurity (04/29/2024)  Housing: Low Risk (04/29/2024)  Transportation Needs: No Transportation Needs (04/29/2024)  Utilities: Not At Risk (04/29/2024)  Depression (PHQ2-9): Medium Risk (02/16/2024)  Financial Resource Strain: Medium Risk (06/24/2021)  Social Connections: Socially Isolated (04/29/2024)  Tobacco Use: Low Risk (04/28/2024)    Readmission Risk Interventions    05/01/2024   11:10 AM 05/02/2023   11:52 AM  Readmission Risk Prevention Plan  Transportation Screening Complete Complete  PCP or Specialist Appt within 5-7 Days  Complete  PCP or Specialist Appt within 3-5 Days Complete   Home Care Screening  Complete  Medication Review (RN CM)  Complete  HRI or Home Care Consult Complete   Palliative Care Screening Not Applicable   Medication Review (RN Care Manager) Complete

## 2024-05-02 NOTE — [place] FL2 (Signed)
 [place]  MEDICAID FL2 LEVEL OF CARE FORM     IDENTIFICATION  Patient Name: Cynthia Singh Birthdate: [birth year] Sex: female Admission Date (Current Location): 04/28/2024  [facility] and [place] Number:  Chiropodist and Address:  [facility], [address], [place], [place] [address]      Provider Number: [id number]  Attending Physician Name and Address:  Marsa Edelman, DO  Relative Name and Phone Number:       Current Level of Care: Hospital Recommended Level of Care: Skilled Nursing Facility Prior Approval Number:    Date Approved/Denied:   PASRR Number: [id number]  Discharge Plan: SNF    Current Diagnoses: Patient Active Problem List   Diagnosis Date Noted   Sepsis due to gram-negative UTI (HCC) 04/28/2024   Left shoulder pain 02/16/2024   Stage 3a chronic kidney disease (HCC) 11/16/2023   Diabetes mellitus treated with insulin  and oral medication (HCC) 05/10/2023   Vertigo 01/05/2023   GERD (gastroesophageal reflux disease) 07/14/2021   Peripheral vascular disease, unspecified 2020   Chronic narcotic dependence (HCC) 10/29/2016   CAD (coronary artery disease) 07/19/2016   Preventative health care 04/30/2014   Advanced directives, counseling/discussion 04/30/2014   Atherosclerotic heart disease of native coronary artery with angina pectoris 10/21/2012   KNEE PAIN, LEFT, CHRONIC 02/25/2010   Type 2 diabetes mellitus (HCC) 12/30/2009   Hyperlipidemia associated with type 2 diabetes mellitus (HCC) 12/30/2009   Mood disorder 12/30/2009   Hypertension associated with type 2 diabetes mellitus (HCC) 12/30/2009   Chronic back pain 12/30/2009    Orientation RESPIRATION BLADDER Height & Weight     Self, Time, Situation, Place  Normal Incontinent Weight: 81.6 kg Height:  5' 8 (172.7 cm)  BEHAVIORAL SYMPTOMS/MOOD NEUROLOGICAL BOWEL NUTRITION STATUS   (appropriate)  (none)  (unknown) Diet (dys 1)  AMBULATORY STATUS  COMMUNICATION OF NEEDS Skin   Extensive Assist Verbally Normal                       Personal Care Assistance Level of Assistance  Bathing, Feeding, Dressing Bathing Assistance: Maximum assistance Feeding assistance: Maximum assistance Dressing Assistance: Maximum assistance     Functional Limitations Info  Sight, Hearing, Speech Sight Info: Impaired Hearing Info: Adequate Speech Info: Adequate    SPECIAL CARE FACTORS FREQUENCY  PT (By licensed PT), OT (By licensed OT)     PT Frequency: 5x week OT Frequency: 5x week            Contractures Contractures Info: Not present    Additional Factors Info  Code Status, Allergies Code Status Info: full Allergies Info:  (unknown at this time)           Current Medications (05/02/2024):  This is the current hospital active medication list Current Facility-Administered Medications  Medication Dose Route Frequency Provider Last Rate Last Admin   acetaminophen  (TYLENOL ) tablet 650 mg  650 mg Oral Q6H PRN Khan, Ghalib, MD   650 mg at 04/30/24 2304   Or   acetaminophen  (TYLENOL ) suppository 650 mg  650 mg Rectal Q6H PRN Khan, Ghalib, MD       amLODipine  (NORVASC ) tablet 2.5 mg  2.5 mg Oral Daily Alexander, Natalie, DO   2.5 mg at 05/02/24 0844   aspirin  EC tablet 81 mg  81 mg Oral Daily Alexander, Natalie, DO   81 mg at 05/02/24 0844   cefTRIAXone  (ROCEPHIN ) 2 g in sodium chloride  0.9 % 100 mL IVPB  2 g Intravenous Q24H Fernand,  Ghalib, MD 200 mL/hr at 05/01/24 1737 2 g at 05/01/24 1737   clopidogrel  (PLAVIX ) tablet 75 mg  75 mg Oral Daily Alexander, Natalie, DO   75 mg at 05/02/24 0844   heparin  injection 5,000 Units  5,000 Units Subcutaneous Q8H Fernand Prost, MD   5,000 Units at 05/02/24 9485   hydrALAZINE  (APRESOLINE ) injection 5 mg  5 mg Intravenous Q6H PRN Alexander, Natalie, DO       HYDROmorphone  (DILAUDID ) injection 0.25 mg  0.25 mg Intravenous Q2H PRN Khan, Ghalib, MD   0.25 mg at 04/30/24 2304   micafungin  (MYCAMINE )  100 mg in sodium chloride  0.9 % 100 mL IVPB  100 mg Intravenous Q24H Mansy, Jan A, MD 105 mL/hr at 05/02/24 0851 100 mg at 05/02/24 9148   nortriptyline  (PAMELOR ) capsule 150 mg  150 mg Oral QHS Alexander, Natalie, DO   150 mg at 05/01/24 2131   ondansetron  (ZOFRAN ) tablet 4 mg  4 mg Oral Q6H PRN Khan, Ghalib, MD       Or   ondansetron  (ZOFRAN ) injection 4 mg  4 mg Intravenous Q6H PRN Khan, Ghalib, MD   4 mg at 04/29/24 1058   Oral care mouth rinse  15 mL Mouth Rinse PRN Alexander, Natalie, DO       oxyCODONE  (Oxy IR/ROXICODONE ) immediate release tablet 5 mg  5 mg Oral Q4H PRN Khan, Ghalib, MD   5 mg at 05/01/24 1418   polyethylene glycol (MIRALAX  / GLYCOLAX ) packet 17 g  17 g Oral Daily Alexander, Natalie, DO       senna-docusate (Senokot-S) tablet 2 tablet  2 tablet Oral QHS PRN Alexander, Natalie, DO       sodium chloride  flush (NS) 0.9 % injection 3 mL  3 mL Intravenous Q12H Khan, Ghalib, MD   3 mL at 05/02/24 1027   Facility-Administered Medications Ordered in Other Encounters  Medication Dose Route Frequency Provider Last Rate Last Admin   perflutren  lipid microspheres (DEFINITY ) IV suspension  1-10 mL Intravenous PRN Epifanio Alm SQUIBB, MD   3 mL at 05/02/24 9052     Discharge Medications: Please see discharge summary for a list of discharge medications.  Relevant Imaging Results:  Relevant Lab Results:   Additional Information ss# [id number]  Shasta DELENA Daring, RN

## 2024-05-02 NOTE — Telephone Encounter (Signed)
Still hospitalized

## 2024-05-02 NOTE — Progress Notes (Signed)
 Physical Therapy Treatment Patient Details Name: Cynthia Singh MRN: [id number] DOB: October 21, 1940 Today's Date: 05/02/2024   History of Present Illness 83 y.o. female admitted with sepsis & acute metabolic encephalopathy due to UTI. Also found to have subacute vertebral fracture L1, T5, T12, no neurosurgical intervention, TLSO brace when OOB, ok to take off with shower. PMH significant for diabetes, HTN, GERD, CKD, MI, obesity, CAD, OA    PT Comments  Requires min encouragement from therapist for participation with session; ultimately limited/dictated by bladder incontinence noted during repositioning efforts.  Requiring min assist for rolling bilat (improved from previous sessions); max assist for scooting up in bed and repositioning to midline/comfort.  Declined transition to edge of bed or OOB due to pain; meds requested per RN during session.   Transition to modified chair position in bed for repositioning/pressure relief and gradual tolerance to upright positioning; positioned to comfort with needs in reach.  Tolerating well end of session.   If plan is discharge home, recommend the following: A lot of help with walking and/or transfers;A lot of help with bathing/dressing/bathroom;Assistance with cooking/housework;Help with stairs or ramp for entrance;Assist for transportation   Can travel by private vehicle     No  Equipment Recommendations       Recommendations for Other Services       Precautions / Restrictions Precautions Precautions: Fall;Back Recall of Precautions/Restrictions: Impaired Required Braces or Orthoses: Spinal Brace Spinal Brace: Thoracolumbosacral orthotic Spinal Brace Comments: when OOB     Mobility  Bed Mobility Overal bed mobility: Needs Assistance Bed Mobility: Rolling Rolling: Min assist, Used rails         General bed mobility comments: rolls bilat with heavy use of bedrails, min assist; cuing for log rolling and UE position/reaching pattern to  optimize movement    Transfers                   General transfer comment: patient declined due to pain    Ambulation/Gait               General Gait Details: patient declined due to pain   Stairs             Wheelchair Mobility     Tilt Bed    Modified Rankin (Stroke Patients Only)       Balance                                            Communication    Cognition Arousal: Alert Behavior During Therapy: [facility] for tasks assessed/performed                                    Cueing    Exercises Other Exercises Other Exercises: Patient endorsed bed feeling wet during bed mobility and repositioning efforts.  Noted to have been incontinent of urine, requiring gown, linen/bed change.  Rolls bilat with min assist; heavy use of bedrails.  Mod assist for peri-care (in supine); min/mod assist for gown change; dep assist for linen change. Other Exercises: Transition to modified chair position in bed for repositioning/pressure relief and gradual tolerance to upright positioning; positioned to comfort with needs in reach.  Tolerating well end of session.    General Comments        Pertinent Vitals/Pain Pain Assessment Pain  Assessment: Faces Faces Pain Scale: Hurts even more Pain Location: mid/low back - severe pain with movement Pain Descriptors / Indicators: Grimacing, Guarding, Discomfort Pain Intervention(s): Limited activity within patient's tolerance, Repositioned, Monitored during session, Patient requesting pain meds-RN notified    Home Living                          Prior Function            PT Goals (current goals can now be found in the care plan section) Acute Rehab PT Goals Patient Stated Goal: not to over-do it today PT Goal Formulation: With patient Time For Goal Achievement: 05/13/24 Potential to Achieve Goals: Fair Progress towards PT goals: Progressing toward goals     Frequency    Min 2X/week      PT Plan      Co-evaluation              AM-PAC PT 6 Clicks Mobility   Outcome Measure  Help needed turning from your back to your side while in a flat bed without using bedrails?: A Little Help needed moving from lying on your back to sitting on the side of a flat bed without using bedrails?: Total Help needed moving to and from a bed to a chair (including a wheelchair)?: Total Help needed standing up from a chair using your arms (e.g., wheelchair or bedside chair)?: Total Help needed to walk in hospital room?: Total Help needed climbing 3-5 steps with a railing? : Total 6 Click Score: 8    End of Session   Activity Tolerance: Patient limited by pain Patient left: with bed alarm set;with call bell/phone within reach Nurse Communication: Mobility status PT Visit Diagnosis: Unsteadiness on feet (R26.81);Muscle weakness (generalized) (M62.81);Difficulty in walking, not elsewhere classified (R26.2);Pain     Time: 8389-8365 PT Time Calculation (min) (ACUTE ONLY): 24 min  Charges:    $Therapeutic Activity: 23-37 mins PT General Charges $$ ACUTE PT VISIT: 1 Visit                    Princella Jaskiewicz H. Delores, PT, DPT, NCS 05/02/2024, 4:55 PM [phone]

## 2024-05-02 NOTE — Progress Notes (Signed)
 PROGRESS NOTE    Cynthia Singh   FMW:[id number] DOB: 02-Feb-1941  DOA: 04/28/2024 Date of Service: 05/02/2024 which is hospital day 4  PCP: Wendee Lynwood HERO, NP    Hospital course / significant events:   HPI: Cynthia Singh is a 83 y.o. year old female with medical history of hypertension, hyperlipidemia, type 2 diabetes, CKD 3A presenting to the ED with nausea, vomiting, diarrhea, suprapubic abd pain, chills without fever, and generalized weakness. Disoriented and hx limited. Family reports no underlying cognitive problems.   12/12: to ED. Admitted for UTI. Chest x-ray with atelectasis and CT abdomen pelvis shows mild left renal pyelectasis and ureterectasis with gas in the renal collecting system with concern for VUR.  12/13: (+)Ecoli cultures urine and blood, continue abx. Neurosurgery has seen pt, no intervention planned. Alert but remains confused.  12/14: (+)candidemia resulted overnight, added micofungin. Mental status is improved today  12/15: ID consult - suspect fungemia likely contaminant  12/16: Echo pending. Pt doing well. Hyponatremia, working this up, restart NS fluids.      Consultants:  Neurosurgery Infectious disease   Procedures/Surgeries: noen      ASSESSMENT & PLAN:   Sepsis d/t E.Coli UTI Severe sepsis given encephalopathy, AKI Bacteremia d/t UTI BCx L wrist collected 12/12 @13 :46 --> (+)yeast in aerobic bottle, candida albicans, candida glabrata BCx L arm collected 12/12 @08 :38 --> (+)Ecoli in aerobic and anaerobic  UCx collected 12/12 @11 :03 --> (+)Ecoli sensitive to ceftriaxone  Hematuria d/t UTI Lactic acidosis - resolved  Leukocytosis - improving Ceftriaxone , Micofungin  Monitor sepsis parameters and mental status --> improved  Repeat BCx per ID  Echocardiogram pending   Concern for urinary retention / vesicoureteral reflux Leaking around Foley Foley has been d/c  OK for [place] if needed Bladder scan q8h / prn   Fungemia - most  likely contaminant as not typical patient for this problem, no indwelling lines / pacemakers  abx per ID   AKI d/t UTI/dehydration - improvement question also d/t vesicoureteral reflux / retention AKI on CKD3a Cr 3.27 on admission --> 1.7 today  Monitor BMP Tx underlying cause(s) Foley continue   Hyponatremia  Question d/t renal injury but Cr improving now  Will add on serum osm, urine osm and na Resume NS fluids   Subacute vertebral fracture L1, T12 Per neurosurgery --> no intervention acutely  TLSO brace when out of bed   PT/OT as able Outpatient neurosurgery follow up   Elevated LFTs, mild  Exact etiology of this is unknown  No concerns RUQ US  Trend    Hypertension Hold home meds given sepsis / risk hypotension - BP up a bit, restarting amlodipine    Type 2 diabetes At home she is on insulin  and metformin  and Jardiance  SSI while here   Hyperlipidemia/CAD:  Holding statin w/ borderline LFT ASA, Plavix     Recent fall PT/OT  Acute metabolic encephalopathy d/t UTI / uremia - resolved  Monitor   overweight based on BMI: Body mass index is 25.95 kg/m.Cynthia Singh Significantly low or high BMI is associated with higher medical risk.  Underweight - under 18  overweight - 25 to 29 obese - 30 or more Class 1 obesity: BMI of 30.0 to 34 Class 2 obesity: BMI of 35.0 to 39 Class 3 obesity: BMI of 40.0 to 49 Super Morbid Obesity: BMI 50-59 Super-super Morbid Obesity: BMI 60+ Healthy nutrition and physical activity advised as adjunct to other disease management and risk reduction treatments    DVT prophylaxis: heparin  IV fluids:  LR continuous IV fluids dc now that taking po, monitor BMP Nutrition: dysphagia diet Central lines / other devices: none  Code Status: FULL CODE ACP documentation reviewed:  none on file in [facility]  TOC needs: TBD Medical barriers to dispo: IV abx, bacteremia. Expected medical readiness for discharge few days.           Subjective / Brief  ROS:  Patient denies pain remains alert today Tired but otherwise no complaints    Family Communication: son and grandson at bedside on rounds all questions answered     Objective Findings:  Vitals:   05/02/24 0428 05/02/24 0500 05/02/24 0752 05/02/24 1146  BP: 120/68  (!) 125/58 (!) 156/70  Pulse: 92  86 88  Resp: 16   16  Temp: 98.6 F (37 C)  98.3 F (36.8 C) 98 F (36.7 C)  TempSrc:   Axillary   SpO2: 91%  95%   Weight:  81.6 kg    Height:        Intake/Output Summary (Last 24 hours) at 05/02/2024 1410 Last data filed at 05/02/2024 1300 Gross per 24 hour  Intake 480 ml  Output 1500 ml  Net -1020 ml   Filed Weights   04/30/24 0500 05/01/24 0417 05/02/24 0500  Weight: 77.3 kg 77.3 kg 81.6 kg    Examination:  Physical Exam Constitutional:      General: She is not in acute distress.    Appearance: She is ill-appearing.  Cardiovascular:     Rate and Rhythm: Normal rate and regular rhythm.  Pulmonary:     Effort: Pulmonary effort is normal.     Breath sounds: Normal breath sounds.  Abdominal:     Palpations: Abdomen is soft.  Neurological:     Mental Status: She is alert and oriented to person, place, and time. Mental status is at baseline.  Psychiatric:        Mood and Affect: Mood normal.        Behavior: Behavior normal.          Scheduled Medications:   amLODipine   2.5 mg Oral Daily   aspirin  EC  81 mg Oral Daily   clopidogrel   75 mg Oral Daily   heparin   5,000 Units Subcutaneous Q8H   nortriptyline   150 mg Oral QHS   polyethylene glycol  17 g Oral Daily   sodium chloride  flush  3 mL Intravenous Q12H    Continuous Infusions:  sodium chloride      cefTRIAXone  (ROCEPHIN )  IV 2 g (05/01/24 1737)   micafungin  (MYCAMINE ) 100 mg in sodium chloride  0.9 % 100 mL IVPB 100 mg (05/02/24 0851)    PRN Medications:  acetaminophen  **OR** acetaminophen , hydrALAZINE , ondansetron  **OR** ondansetron  (ZOFRAN ) IV, mouth rinse, oxyCODONE ,  senna-docusate  Antimicrobials from admission:  Anti-infectives (From admission, onward)    Start     Dose/Rate Route Frequency Ordered Stop   04/30/24 0400  micafungin  (MYCAMINE ) 100 mg in sodium chloride  0.9 % 100 mL IVPB        100 mg 105 mL/hr over 1 Hours Intravenous Every 24 hours 04/30/24 0243     04/28/24 1600  cefTRIAXone  (ROCEPHIN ) 2 g in sodium chloride  0.9 % 100 mL IVPB        2 g 200 mL/hr over 30 Minutes Intravenous Every 24 hours 04/28/24 1315 05/05/24 1559   04/28/24 0930  ceFEPIme  (MAXIPIME ) 2 g in sodium chloride  0.9 % 100 mL IVPB        2 g 200 mL/hr  over 30 Minutes Intravenous  Once 04/28/24 0923 04/28/24 1022   04/28/24 0930  metroNIDAZOLE  (FLAGYL ) IVPB 500 mg        500 mg 100 mL/hr over 60 Minutes Intravenous  Once 04/28/24 9076 04/28/24 1136           Data Reviewed:  I have personally reviewed the following...  CBC: Recent Labs  Lab 04/28/24 0838 04/29/24 0345 04/30/24 0345 05/01/24 0420 05/02/24 0357  WBC 30.3* 19.1* 18.5* 12.0* 11.6*  NEUTROABS 25.4*  --   --   --   --   HGB 11.4* 10.9* 11.2* 10.9* 10.6*  HCT 35.6* 32.8* 35.2* 33.0* 32.2*  MCV 89.2 87.7 90.7 86.6 84.7  PLT 181 146* 132* 125* 157   Basic Metabolic Panel: Recent Labs  Lab 04/28/24 0838 04/29/24 0345 04/30/24 0345 05/01/24 0420 05/02/24 0357  NA 134* 139 140 134* 129*  K 4.7 4.4 5.0 3.6 3.9  CL 99 108 109 103 98  CO2 15* 17* 18* 17* 17*  GLUCOSE 179* 134* 87 93 114*  BUN 55* 59* 62* 56* 50*  CREATININE 3.27* 2.90* 2.38* 1.96* 1.77*  CALCIUM  7.7* 8.3* 8.6* 8.3* 7.9*  MG 1.5*  --   --   --   --    GFR: Estimated Creatinine Clearance: 28.4 mL/min (A) (by C-G formula based on SCr of 1.77 mg/dL (H)). Liver Function Tests: Recent Labs  Lab 04/28/24 0838 04/30/24 0345  AST 45* 20  ALT 71* 24  ALKPHOS 165* 165*  BILITOT 0.7 0.3  PROT 6.3* 6.3*  ALBUMIN 3.0* 2.6*   Recent Labs  Lab 04/28/24 0838  LIPASE <10*   No results for input(s): AMMONIA in the last  168 hours. Coagulation Profile: Recent Labs  Lab 04/28/24 1346  INR 1.4*   Cardiac Enzymes: No results for input(s): CKTOTAL, CKMB, CKMBINDEX, TROPONINI in the last 168 hours. BNP (last 3 results) No results for input(s): PROBNP in the last 8760 hours. HbA1C: No results for input(s): HGBA1C in the last 72 hours. CBG: Recent Labs  Lab 04/30/24 1556 04/30/24 2000 05/01/24 0756 05/01/24 1157 05/01/24 1702  GLUCAP 133* 84 93 142* 106*   Lipid Profile: No results for input(s): CHOL, HDL, LDLCALC, TRIG, CHOLHDL, LDLDIRECT in the last 72 hours. Thyroid Function Tests: No results for input(s): TSH, T4TOTAL, FREET4, T3FREE, THYROIDAB in the last 72 hours. Anemia Panel: No results for input(s): VITAMINB12, FOLATE, FERRITIN, TIBC, IRON , RETICCTPCT in the last 72 hours. Most Recent Urinalysis On File:     Component Value Date/Time   COLORURINE AMBER (A) 04/28/2024 1103   APPEARANCEUR TURBID (A) 04/28/2024 1103   LABSPEC 1.013 04/28/2024 1103   PHURINE 6.0 04/28/2024 1103   GLUCOSEU >=500 (A) 04/28/2024 1103   HGBUR LARGE (A) 04/28/2024 1103   BILIRUBINUR NEGATIVE 04/28/2024 1103   BILIRUBINUR neg 06/14/2019 1408   KETONESUR NEGATIVE 04/28/2024 1103   PROTEINUR 100 (A) 04/28/2024 1103   UROBILINOGEN 0.2 06/14/2019 1408   UROBILINOGEN 1.0 10/19/2012 1030   NITRITE POSITIVE (A) 04/28/2024 1103   LEUKOCYTESUR MODERATE (A) 04/28/2024 1103   Sepsis Labs: @LABRCNTIP (procalcitonin:4,lacticidven:4) Microbiology: Recent Results (from the past 240 hours)  Resp panel by RT-PCR (RSV, Flu A&B, Covid) Anterior Nasal Swab     Status: None   Collection Time: 04/28/24  8:30 AM   Specimen: Anterior Nasal Swab  Result Value Ref Range Status   SARS Coronavirus 2 by RT PCR NEGATIVE NEGATIVE Final    Comment: (NOTE) SARS-CoV-2 target nucleic acids are NOT DETECTED.  The SARS-CoV-2 RNA is generally detectable in upper respiratory specimens during  the acute phase of infection. The lowest concentration of SARS-CoV-2 viral copies this assay can detect is 138 copies/mL. A negative result does not preclude SARS-Cov-2 infection and should not be used as the sole basis for treatment or other patient management decisions. A negative result may occur with  improper specimen collection/handling, submission of specimen other than nasopharyngeal swab, presence of viral mutation(s) within the areas targeted by this assay, and inadequate number of viral copies(<138 copies/mL). A negative result must be combined with clinical observations, patient history, and epidemiological information. The expected result is Negative.  Fact Sheet for Patients:  bloggercourse.com  Fact Sheet for Healthcare Providers:  seriousbroker.it  This test is no t yet approved or cleared by the [place]  FDA and  has been authorized for detection and/or diagnosis of SARS-CoV-2 by FDA under an Emergency Use Authorization (EUA). This EUA will remain  in effect (meaning this test can be used) for the duration of the COVID-19 declaration under Section 564(b)(1) of the Act, 21 U.S.C.section 360bbb-3(b)(1), unless the authorization is terminated  or revoked sooner.       Influenza A by PCR NEGATIVE NEGATIVE Final   Influenza B by PCR NEGATIVE NEGATIVE Final    Comment: (NOTE) The Xpert Xpress SARS-CoV-2/FLU/RSV plus assay is intended as an aid in the diagnosis of influenza from Nasopharyngeal swab specimens and should not be used as a sole basis for treatment. Nasal washings and aspirates are unacceptable for Xpert Xpress SARS-CoV-2/FLU/RSV testing.  Fact Sheet for Patients: bloggercourse.com  Fact Sheet for Healthcare Providers: seriousbroker.it  This test is not yet approved or cleared by the [place]  FDA and has been authorized for detection and/or  diagnosis of SARS-CoV-2 by FDA under an Emergency Use Authorization (EUA). This EUA will remain in effect (meaning this test can be used) for the duration of the COVID-19 declaration under Section 564(b)(1) of the Act, 21 U.S.C. section 360bbb-3(b)(1), unless the authorization is terminated or revoked.     Resp Syncytial Virus by PCR NEGATIVE NEGATIVE Final    Comment: (NOTE) Fact Sheet for Patients: bloggercourse.com  Fact Sheet for Healthcare Providers: seriousbroker.it  This test is not yet approved or cleared by the [place]  FDA and has been authorized for detection and/or diagnosis of SARS-CoV-2 by FDA under an Emergency Use Authorization (EUA). This EUA will remain in effect (meaning this test can be used) for the duration of the COVID-19 declaration under Section 564(b)(1) of the Act, 21 U.S.C. section 360bbb-3(b)(1), unless the authorization is terminated or revoked.  Performed at [facility], [address] [place], [place] [address]   Blood Culture (routine x 2)     Status: Abnormal   Collection Time: 04/28/24  8:38 AM   Specimen: BLOOD  Result Value Ref Range Status   Specimen Description   Final    BLOOD BLOOD LEFT ARM Performed at [facility], [address] [place], [place] [address]    Special Requests   Final    BOTTLES DRAWN AEROBIC AND ANAEROBIC Blood Culture adequate volume Performed at [facility], [address] [place], [place] [address]    Culture  Setup Time   Final    IN BOTH AEROBIC AND ANAEROBIC BOTTLES GRAM NEGATIVE RODS CRITICAL RESULT CALLED TO, READ BACK BY AND VERIFIED WITH: NATHAN BELUE @ 04/28/2024 2243 AB Performed at [facility], [address], [place], [place] [address]  Culture ESCHERICHIA COLI (A)  Final   Report Status 05/01/2024 FINAL  Final   Organism ID, Bacteria ESCHERICHIA COLI  Final      Susceptibility   Escherichia coli -  MIC*    AMPICILLIN >=32 RESISTANT Resistant     CEFAZOLIN (NON-URINE) 8 RESISTANT Resistant     CEFEPIME  <=0.12 SENSITIVE Sensitive     ERTAPENEM <=0.12 SENSITIVE Sensitive     CEFTRIAXONE  <=0.25 SENSITIVE Sensitive     CIPROFLOXACIN  <=0.06 SENSITIVE Sensitive     GENTAMICIN <=1 SENSITIVE Sensitive     MEROPENEM <=0.25 SENSITIVE Sensitive     TRIMETH /SULFA  <=20 SENSITIVE Sensitive     AMPICILLIN/SULBACTAM >=32 RESISTANT Resistant     PIP/TAZO Value in next row Sensitive      <=4 SENSITIVEThis is a modified FDA-approved test that has been validated and its performance characteristics determined by the reporting laboratory.  This laboratory is certified under the Clinical Laboratory Improvement Amendments CLIA as qualified to perform high complexity clinical laboratory testing.    * ESCHERICHIA COLI  Blood Culture ID Panel (Reflexed)     Status: Abnormal   Collection Time: 04/28/24  8:38 AM  Result Value Ref Range Status   Enterococcus faecalis NOT DETECTED NOT DETECTED Final   Enterococcus Faecium NOT DETECTED NOT DETECTED Final   Listeria monocytogenes NOT DETECTED NOT DETECTED Final   Staphylococcus species NOT DETECTED NOT DETECTED Final   Staphylococcus aureus (BCID) NOT DETECTED NOT DETECTED Final   Staphylococcus epidermidis NOT DETECTED NOT DETECTED Final   Staphylococcus lugdunensis NOT DETECTED NOT DETECTED Final   Streptococcus species NOT DETECTED NOT DETECTED Final   Streptococcus agalactiae NOT DETECTED NOT DETECTED Final   Streptococcus pneumoniae NOT DETECTED NOT DETECTED Final   Streptococcus pyogenes NOT DETECTED NOT DETECTED Final   A.calcoaceticus-baumannii NOT DETECTED NOT DETECTED Final   Bacteroides fragilis NOT DETECTED NOT DETECTED Final   Enterobacterales DETECTED (A) NOT DETECTED Final    Comment: Enterobacterales represent a large order of gram negative bacteria, not a single organism. CRITICAL RESULT CALLED TO, READ BACK BY AND VERIFIED WITH: NATHAN BELUE @  04/28/2024 2243 AB    Enterobacter cloacae complex NOT DETECTED NOT DETECTED Final   Escherichia coli DETECTED (A) NOT DETECTED Final    Comment: CRITICAL RESULT CALLED TO, READ BACK BY AND VERIFIED WITH: NATHAN BELUE @ 04/28/2024 2243 AB    Klebsiella aerogenes NOT DETECTED NOT DETECTED Final   Klebsiella oxytoca NOT DETECTED NOT DETECTED Final   Klebsiella pneumoniae NOT DETECTED NOT DETECTED Final   Proteus species NOT DETECTED NOT DETECTED Final   Salmonella species NOT DETECTED NOT DETECTED Final   Serratia marcescens NOT DETECTED NOT DETECTED Final   Haemophilus influenzae NOT DETECTED NOT DETECTED Final   Neisseria meningitidis NOT DETECTED NOT DETECTED Final   Pseudomonas aeruginosa NOT DETECTED NOT DETECTED Final   Stenotrophomonas maltophilia NOT DETECTED NOT DETECTED Final   Candida albicans NOT DETECTED NOT DETECTED Final   Candida auris NOT DETECTED NOT DETECTED Final   Candida glabrata NOT DETECTED NOT DETECTED Final   Candida krusei NOT DETECTED NOT DETECTED Final   Candida parapsilosis NOT DETECTED NOT DETECTED Final   Candida tropicalis NOT DETECTED NOT DETECTED Final   Cryptococcus neoformans/gattii NOT DETECTED NOT DETECTED Final   CTX-M ESBL NOT DETECTED NOT DETECTED Final   Carbapenem resistance IMP NOT DETECTED NOT DETECTED Final   Carbapenem resistance KPC NOT DETECTED NOT DETECTED Final   Carbapenem resistance NDM NOT DETECTED NOT DETECTED Final   Carbapenem resist  OXA 48 LIKE NOT DETECTED NOT DETECTED Final   Carbapenem resistance VIM NOT DETECTED NOT DETECTED Final    Comment: Performed at [facility], [address] [place], [place] [address]  Urine Culture (for pregnant, neutropenic or urologic patients or patients with an indwelling urinary catheter)     Status: Abnormal   Collection Time: 04/28/24 11:03 AM   Specimen: Urine, Clean Catch  Result Value Ref Range Status   Specimen Description   Final    URINE, CLEAN CATCH Performed at  [facility], [address] [place], [place] [address]    Special Requests   Final    NONE Performed at [facility], [address] [place], [place] [address]    Culture 50,000 COLONIES/mL ESCHERICHIA COLI (A)  Final   Report Status 04/30/2024 FINAL  Final   Organism ID, Bacteria ESCHERICHIA COLI (A)  Final      Susceptibility   Escherichia coli - MIC*    AMPICILLIN >=32 RESISTANT Resistant     CEFAZOLIN (URINE) Value in next row Sensitive      8 SENSITIVEThis is a modified FDA-approved test that has been validated and its performance characteristics determined by the reporting laboratory.  This laboratory is certified under the Clinical Laboratory Improvement Amendments CLIA as qualified to perform high complexity clinical laboratory testing.    CEFEPIME  Value in next row Sensitive      8 SENSITIVEThis is a modified FDA-approved test that has been validated and its performance characteristics determined by the reporting laboratory.  This laboratory is certified under the Clinical Laboratory Improvement Amendments CLIA as qualified to perform high complexity clinical laboratory testing.    ERTAPENEM Value in next row Sensitive      8 SENSITIVEThis is a modified FDA-approved test that has been validated and its performance characteristics determined by the reporting laboratory.  This laboratory is certified under the Clinical Laboratory Improvement Amendments CLIA as qualified to perform high complexity clinical laboratory testing.    CEFTRIAXONE  Value in next row Sensitive      8 SENSITIVEThis is a modified FDA-approved test that has been validated and its performance characteristics determined by the reporting laboratory.  This laboratory is certified under the Clinical Laboratory Improvement Amendments CLIA as qualified to perform high complexity clinical laboratory testing.    CIPROFLOXACIN  Value in next row Sensitive      8 SENSITIVEThis is a modified FDA-approved  test that has been validated and its performance characteristics determined by the reporting laboratory.  This laboratory is certified under the Clinical Laboratory Improvement Amendments CLIA as qualified to perform high complexity clinical laboratory testing.    GENTAMICIN Value in next row Sensitive      8 SENSITIVEThis is a modified FDA-approved test that has been validated and its performance characteristics determined by the reporting laboratory.  This laboratory is certified under the Clinical Laboratory Improvement Amendments CLIA as qualified to perform high complexity clinical laboratory testing.    NITROFURANTOIN Value in next row Sensitive      8 SENSITIVEThis is a modified FDA-approved test that has been validated and its performance characteristics determined by the reporting laboratory.  This laboratory is certified under the Clinical Laboratory Improvement Amendments CLIA as qualified to perform high complexity clinical laboratory testing.    TRIMETH /SULFA  Value in next row Sensitive      8 SENSITIVEThis is a modified FDA-approved test that has been validated and its performance characteristics determined by the reporting laboratory.  This laboratory is certified  under the Clinical Laboratory Improvement Amendments CLIA as qualified to perform high complexity clinical laboratory testing.    AMPICILLIN/SULBACTAM Value in next row Intermediate      8 SENSITIVEThis is a modified FDA-approved test that has been validated and its performance characteristics determined by the reporting laboratory.  This laboratory is certified under the Clinical Laboratory Improvement Amendments CLIA as qualified to perform high complexity clinical laboratory testing.    PIP/TAZO Value in next row Sensitive      <=4 SENSITIVEThis is a modified FDA-approved test that has been validated and its performance characteristics determined by the reporting laboratory.  This laboratory is certified under the Clinical  Laboratory Improvement Amendments CLIA as qualified to perform high complexity clinical laboratory testing.    MEROPENEM Value in next row Sensitive      <=4 SENSITIVEThis is a modified FDA-approved test that has been validated and its performance characteristics determined by the reporting laboratory.  This laboratory is certified under the Clinical Laboratory Improvement Amendments CLIA as qualified to perform high complexity clinical laboratory testing.    * 50,000 COLONIES/mL ESCHERICHIA COLI  Blood Culture (routine x 2)     Status: Abnormal (Preliminary result)   Collection Time: 04/28/24  1:46 PM   Specimen: BLOOD LEFT WRIST  Result Value Ref Range Status   Specimen Description   Final    BLOOD LEFT WRIST Performed at [facility], [address], [place], [place] [address]    Special Requests   Final    BOTTLES DRAWN AEROBIC AND ANAEROBIC Blood Culture adequate volume Performed at [facility], [address] [place], [place] [address]    Culture  Setup Time (A)  Final    YEAST AEROBIC BOTTLE ONLY CRITICAL RESULT CALLED TO, READ BACK BY AND VERIFIED WITH: NATHAN BELUE @ 04/30/2024 0216 AB Performed at [facility], [address] [place], [place] [address]    Culture CANDIDA ALBICANS (A)  Final   Report Status PENDING  Incomplete  Blood Culture ID Panel (Reflexed)     Status: Abnormal   Collection Time: 04/28/24  1:46 PM  Result Value Ref Range Status   Enterococcus faecalis NOT DETECTED NOT DETECTED Final   Enterococcus Faecium NOT DETECTED NOT DETECTED Final   Listeria monocytogenes NOT DETECTED NOT DETECTED Final   Staphylococcus species NOT DETECTED NOT DETECTED Final   Staphylococcus aureus (BCID) NOT DETECTED NOT DETECTED Final   Staphylococcus epidermidis NOT DETECTED NOT DETECTED Final   Staphylococcus lugdunensis NOT DETECTED NOT DETECTED Final   Streptococcus species NOT DETECTED NOT DETECTED Final   Streptococcus agalactiae NOT DETECTED  NOT DETECTED Final   Streptococcus pneumoniae NOT DETECTED NOT DETECTED Final   Streptococcus pyogenes NOT DETECTED NOT DETECTED Final   A.calcoaceticus-baumannii NOT DETECTED NOT DETECTED Final   Bacteroides fragilis NOT DETECTED NOT DETECTED Final   Enterobacterales NOT DETECTED NOT DETECTED Final   Enterobacter cloacae complex NOT DETECTED NOT DETECTED Final   Escherichia coli NOT DETECTED NOT DETECTED Final   Klebsiella aerogenes NOT DETECTED NOT DETECTED Final   Klebsiella oxytoca NOT DETECTED NOT DETECTED Final   Klebsiella pneumoniae NOT DETECTED NOT DETECTED Final   Proteus species NOT DETECTED NOT DETECTED Final   Salmonella species NOT DETECTED NOT DETECTED Final   Serratia marcescens NOT DETECTED NOT DETECTED Final   Haemophilus influenzae NOT DETECTED NOT DETECTED Final   Neisseria meningitidis NOT DETECTED NOT DETECTED Final   Pseudomonas aeruginosa NOT DETECTED NOT DETECTED Final   Stenotrophomonas maltophilia  NOT DETECTED NOT DETECTED Final   Candida albicans DETECTED (A) NOT DETECTED Final    Comment: CRITICAL RESULT CALLED TO, READ BACK BY AND VERIFIED WITH: NATHAN BELUE @ 04/30/2024 0216 AB    Candida auris NOT DETECTED NOT DETECTED Final   Candida glabrata DETECTED (A) NOT DETECTED Final    Comment: CRITICAL RESULT CALLED TO, READ BACK BY AND VERIFIED WITH: NATHAN BELUE @ 04/30/2024 0216 AB    Candida krusei NOT DETECTED NOT DETECTED Final   Candida parapsilosis NOT DETECTED NOT DETECTED Final   Candida tropicalis NOT DETECTED NOT DETECTED Final   Cryptococcus neoformans/gattii NOT DETECTED NOT DETECTED Final    Comment: Performed at [facility], [address] [place], [place] [address]  Culture, blood (Routine X 2) w Reflex to ID Panel     Status: None (Preliminary result)   Collection Time: 05/01/24  3:16 PM   Specimen: BLOOD  Result Value Ref Range Status   Specimen Description BLOOD BLOOD LEFT ARM  Final   Special Requests   Final    BOTTLES  DRAWN AEROBIC AND ANAEROBIC Blood Culture adequate volume   Culture   Final    NO GROWTH < 24 HOURS Performed at [facility], [address], [place], [place] [address]    Report Status PENDING  Incomplete  Culture, blood (Routine X 2) w Reflex to ID Panel     Status: None (Preliminary result)   Collection Time: 05/01/24  3:17 PM   Specimen: BLOOD  Result Value Ref Range Status   Specimen Description BLOOD BLOOD LEFT HAND  Final   Special Requests AEROBIC BOTTLE ONLY Blood Culture adequate volume  Final   Culture   Final    NO GROWTH < 24 HOURS Performed at [facility], [address] [place], [place] [address]    Report Status PENDING  Incomplete      Radiology Studies last 3 days: US  Abdomen Limited RUQ (LIVER/GB) Result Date: 04/28/2024 EXAM: Right Upper Quadrant Abdominal Ultrasound 04/28/2024 02:34:52 PM TECHNIQUE: Real-time ultrasonography of the right upper quadrant of the abdomen was performed. COMPARISON: CT abdomen and pelvis 04/28/2024. CLINICAL HISTORY: Elevated LFTs. FINDINGS: LIVER: The liver demonstrates normal echogenicity. No intrahepatic biliary ductal dilatation. No evidence of mass. BILIARY SYSTEM: No pericholecystic fluid or wall thickening. No cholelithiasis. Common bile duct is within normal limits measuring 8 mm. OTHER: No right upper quadrant ascites. IMPRESSION: 1. No acute findings. 2. Common bile duct measures 8 mm which is within normal limits . Electronically signed by: Greig Pique MD 04/28/2024 10:02 PM EST RP Workstation: [id number]   CT LUMBAR SPINE WO CONTRAST Result Date: 04/28/2024 EXAM: CT THORACIC AND LUMBAR SPINE WITHOUT INTRAVENOUS CONTRAST 04/28/2024 05:08:00 PM TECHNIQUE: CT of the thoracic and lumbar spine was performed without the administration of intravenous contrast. Multiplanar reformatted images are provided for review. Automated exposure control, iterative reconstruction, and/or weight based adjustment of the mA/kV  was utilized to reduce the radiation dose to as low as reasonably achievable. Incidental adrenal and/or renal findings do not require follow up imaging. COMPARISON: Thoracic and lumbar spine radiographs 04/28/2024. CT abdomen and pelvis 04/28/2024. CTA chest 09/04/2019. CLINICAL HISTORY: fall FINDINGS: THORACIC SPINE: BONES AND ALIGNMENT: Exaggerated upper thoracic kyphosis. Mild upper thoracic levoscoliosis and mid to lower thoracic dextroscoliosis. No significant listhesis. Oblique nondisplaced fracture through the anterior aspect of the T5 vertebral body and superior endplate with involvement of anterior and right lateral osteophytes, new from 2021 but of indeterminate age as some margins  appear more sclerotic/chronic while others are less well defined. No definite associated middle or posterior column fracture. Chronic mild T12 superior endplate compression fracture, unchanged from 2021. Ankylosis by bridging vertebral osteophytes throughout the thoracic spine. Multilevel facet ankylosis as well. DEGENERATIVE CHANGES: No high-grade spinal canal stenosis. SOFT TISSUES: No acute abnormality. Aortic and coronary atherosclerosis. LUMBAR SPINE: BONES AND ALIGNMENT: 5 lumbar type vertebrae. Trace anterolisthesis of L4 and L5. Horizontal fracture through the superior half of the L1 vertebral body with 7 mm of distraction and with a nondisplaced fracture extending through the T12 posterior elements bilaterally. Some of the fracture margins are mildly sclerotic, and this may be subacute. Widespread posterior element ankylosis in the lumbar spine. DEGENERATIVE CHANGES: At least mild spinal stenosis and mild to moderate left neural foraminal stenosis at L4-L5 due to disc bulging, endplate spurring, and severe facet hypertrophy. SOFT TISSUES: Likely mild paravertebral soft tissue swelling at L1. IMPRESSION: 1. Horizontal fracture through the superior half of the L1 vertebral body and T12 posterior elements, possibly  subacute. 2. Oblique nondisplaced fracture through the anterior aspect of the T5 vertebral body, new from 2021 but of indeterminate age. 3. Widespread thoracic and lumbar ankylosis 4. Chronic T12 compression fracture. Electronically signed by: Dasie Hamburg MD 04/28/2024 06:37 PM EST RP Workstation: [id number]   CT THORACIC SPINE WO CONTRAST Result Date: 04/28/2024 EXAM: CT THORACIC AND LUMBAR SPINE WITHOUT INTRAVENOUS CONTRAST 04/28/2024 05:08:00 PM TECHNIQUE: CT of the thoracic and lumbar spine was performed without the administration of intravenous contrast. Multiplanar reformatted images are provided for review. Automated exposure control, iterative reconstruction, and/or weight based adjustment of the mA/kV was utilized to reduce the radiation dose to as low as reasonably achievable. Incidental adrenal and/or renal findings do not require follow up imaging. COMPARISON: Thoracic and lumbar spine radiographs 04/28/2024. CT abdomen and pelvis 04/28/2024. CTA chest 09/04/2019. CLINICAL HISTORY: fall FINDINGS: THORACIC SPINE: BONES AND ALIGNMENT: Exaggerated upper thoracic kyphosis. Mild upper thoracic levoscoliosis and mid to lower thoracic dextroscoliosis. No significant listhesis. Oblique nondisplaced fracture through the anterior aspect of the T5 vertebral body and superior endplate with involvement of anterior and right lateral osteophytes, new from 2021 but of indeterminate age as some margins appear more sclerotic/chronic while others are less well defined. No definite associated middle or posterior column fracture. Chronic mild T12 superior endplate compression fracture, unchanged from 2021. Ankylosis by bridging vertebral osteophytes throughout the thoracic spine. Multilevel facet ankylosis as well. DEGENERATIVE CHANGES: No high-grade spinal canal stenosis. SOFT TISSUES: No acute abnormality. Aortic and coronary atherosclerosis. LUMBAR SPINE: BONES AND ALIGNMENT: 5 lumbar type vertebrae. Trace  anterolisthesis of L4 and L5. Horizontal fracture through the superior half of the L1 vertebral body with 7 mm of distraction and with a nondisplaced fracture extending through the T12 posterior elements bilaterally. Some of the fracture margins are mildly sclerotic, and this may be subacute. Widespread posterior element ankylosis in the lumbar spine. DEGENERATIVE CHANGES: At least mild spinal stenosis and mild to moderate left neural foraminal stenosis at L4-L5 due to disc bulging, endplate spurring, and severe facet hypertrophy. SOFT TISSUES: Likely mild paravertebral soft tissue swelling at L1. IMPRESSION: 1. Horizontal fracture through the superior half of the L1 vertebral body and T12 posterior elements, possibly subacute. 2. Oblique nondisplaced fracture through the anterior aspect of the T5 vertebral body, new from 2021 but of indeterminate age. 3. Widespread thoracic and lumbar ankylosis 4. Chronic T12 compression fracture. Electronically signed by: Dasie Hamburg MD 04/28/2024 06:37 PM EST RP Workstation: [id number]  CT CERVICAL SPINE WO CONTRAST Result Date: 04/28/2024 EXAM: CT CERVICAL SPINE WITHOUT CONTRAST 04/28/2024 05:08:00 PM TECHNIQUE: CT of the cervical spine was performed without the administration of intravenous contrast. Multiplanar reformatted images are provided for review. Automated exposure control, iterative reconstruction, and/or weight based adjustment of the mA/kV was utilized to reduce the radiation dose to as low as reasonably achievable. COMPARISON: CT cervical spine 04/30/2023 CLINICAL HISTORY: Fall FINDINGS: CERVICAL SPINE: BONES AND ALIGNMENT: Chronic straightening of the normal cervical lordosis. Slight left convex cervical curvature near the cervicothoracic junction. No significant listhesis. No acute fracture or suspicious lesion. DEGENERATIVE CHANGES: Spondylosis with large anterior vertebral osteophytes throughout the majority of the cervical spine. Advanced multilevel facet  arthrosis. Potentially moderate multilevel spinal stenosis. Moderate to severe bilateral neural foraminal stenosis at C3-C4. SOFT TISSUES: No prevertebral soft tissue swelling. IMPRESSION: 1. No acute cervical spine fracture or traumatic malalignment. 2. Diffuse cervical spondylosis and facet arthrosis. Electronically signed by: Dasie Hamburg MD 04/28/2024 06:13 PM EST RP Workstation: [id number]   DG Thoracic Spine 2 View Result Date: 04/28/2024 CLINICAL DATA:  Multiple fall EXAM: THORACIC SPINE 2 VIEWS COMPARISON:  CT 04/28/2024, chest CT 09/04/2019 FINDINGS: Scoliosis of the thoracic spine. Moderate superior endplate deformity at T12, probably chronic to the chest CT from 2021 but lucencies on today's CT in the region of the spinous process. Lucency at the superior aspect of L1 vertebral body. Remaining vertebra demonstrate grossly normal stature. Multilevel flowing osteophytosis. IMPRESSION: 1. Moderate superior endplate deformity at T12, probably chronic to the chest CT from 2021 2. Suspected acute fracture involving L1 and posterior elements at T12. Dedicated thoracolumbar CT recommended for further assessment Electronically Signed   By: Luke Bun M.D.   On: 04/28/2024 16:14   DG Lumbar Spine 2-3 Views Result Date: 04/28/2024 CLINICAL DATA:  Multiple fall EXAM: LUMBAR SPINE - 2-3 VIEW COMPARISON:  CT 04/28/2024 FINDINGS: Lumbar alignment within normal limits. Aortic atherosclerosis. Abnormal lucency at the L1 vertebral body with superior endplate deformity. There is also additional superior endplate deformity at T12. On sagittal reconstructions of today CT, possible linear lucency at the spinous process of T12 and left posterior arch of T12. Moderate disc space narrowing L3-L4 and L5-S1. Prominent lower lumbar facet degenerative changes. IMPRESSION: 1. Abnormal lucency at the L1 vertebral body with superior endplate deformity, suspicious for acute fracture. Additional superior endplate deformity at  T12, age indeterminate. Possible linear lucency at the spinous process of T12 and left posterior arch of T12, suspicious for acute fracture. Dedicated thoracolumbar CT recommended for further assessment. 2. Moderate degenerative changes of the lower lumbar spine. Electronically Signed   By: Luke Bun M.D.   On: 04/28/2024 16:12   DG Cervical Spine 2 or 3 views Result Date: 04/28/2024 CLINICAL DATA:  Multiple fall EXAM: CERVICAL SPINE - 2-3 VIEW COMPARISON:  04/12/2011, CT 01/28/2023 FINDINGS: Straightening of the cervical spine. Normal prevertebral soft tissue thickness. Dens and lateral masses are grossly within normal limits allowing for suboptimal positioning. Moderate diffuse disc space narrowing C3 through C7 with bulky multilevel osteophytes. IMPRESSION: Straightening of the cervical spine with moderate diffuse degenerative changes. Cross-sectional imaging follow-up if persistent concern for cervical fracture Electronically Signed   By: Luke Bun M.D.   On: 04/28/2024 16:05   DG HIPS BILAT WITH PELVIS MIN 5 VIEWS Result Date: 04/28/2024 CLINICAL DATA:  Recent fall concern for fracture EXAM: DG HIP (WITH OR WITHOUT PELVIS) 5+V BILAT COMPARISON:  CT 04/28/2024 FINDINGS: SI joints are non widened. Pubic symphysis  appears intact. No definitive fracture or malalignment. Prominent acetabular osteophytes. Mild bilateral hip degenerative change IMPRESSION: No acute osseous abnormality. Cross-sectional imaging follow-up if persistent concern for hip or pelvic fracture Electronically Signed   By: Luke Bun M.D.   On: 04/28/2024 16:03   CT HEAD WO CONTRAST ( ) Result Date: 04/28/2024 EXAM: CT HEAD WITHOUT 04/28/2024 03:06:26 PM TECHNIQUE: CT of the head was performed without the administration of intravenous contrast. Automated exposure control, iterative reconstruction, and/or weight based adjustment of the mA/kV was utilized to reduce the radiation dose to as low as reasonably achievable.  COMPARISON: head CT 04/30/2023 CLINICAL HISTORY: Mental status change, unknown cause FINDINGS: BRAIN AND VENTRICLES: There is no evidence of an acute infarct, intracranial hemorrhage, mass, midline shift, hydrocephalus, or extra-axial fluid collection. Cerebral volume is normal for age. Cerebral white matter hypodensities are nonspecific but compatible with mild chronic small vessel ischemic disease. Calcified atherosclerosis at the skull base. ORBITS: Bilateral cataract extraction. SINUSES AND MASTOIDS: No acute abnormality. SOFT TISSUES AND SKULL: No acute skull fracture. No acute soft tissue abnormality. IMPRESSION: 1. No acute intracranial abnormality. 2. Mild chronic small vessel ischemic disease. Electronically signed by: Dasie Hamburg MD 04/28/2024 03:12 PM EST RP Workstation: [id number]         Laneta Blunt, DO [facility] 05/02/2024, 2:10 PM    Dictation software may have been used to generate the above note. Typos may occur and escape review in typed/dictated notes. Please contact Dr Blunt directly for clarity if needed.  Staff may message me via secure chat in Epic  but this may not receive an immediate response,  please page me for urgent matters!  If 7PM-7AM, please contact night coverage [email or web address]

## 2024-05-03 DIAGNOSIS — N390 Urinary tract infection, site not specified: Secondary | ICD-10-CM | POA: Diagnosis not present

## 2024-05-03 DIAGNOSIS — A4150 Gram-negative sepsis, unspecified: Secondary | ICD-10-CM | POA: Diagnosis not present

## 2024-05-03 LAB — GLUCOSE, CAPILLARY
Glucose-Capillary: 123 mg/dL — ABNORMAL HIGH (ref 70–99)
Glucose-Capillary: 159 mg/dL — ABNORMAL HIGH (ref 70–99)

## 2024-05-03 LAB — SODIUM
Sodium: 128 mmol/L — ABNORMAL LOW (ref 135–145)
Sodium: 130 mmol/L — ABNORMAL LOW (ref 135–145)

## 2024-05-03 LAB — SODIUM, URINE, RANDOM: Sodium, Ur: 51 mmol/L

## 2024-05-03 NOTE — TOC Progression Note (Signed)
 Transition of Care [facility]) - Progression Note    Patient Details  Name: Cynthia Singh MRN: [id number] Date of Birth: [birth year]  Transition [facility] CM/SW Contact  Alfonso Rummer, LCSW Phone Number: 05/03/2024, 12:35 PM  Clinical Narrative:     KEN DELENA Rummer met with pt in room 228 to answer questions regarding transiting to SNF. Pt informed the intention is to transition for STR not LTC. Pt advised if she desire LTC she that will occur once she is at the facility.                     Expected Discharge Plan and Services                                               Social Drivers of Health (SDOH) Interventions SDOH Screenings   Food Insecurity: No Food Insecurity (04/29/2024)  Housing: Low Risk (04/29/2024)  Transportation Needs: No Transportation Needs (04/29/2024)  Utilities: Not At Risk (04/29/2024)  Depression (PHQ2-9): Medium Risk (02/16/2024)  Financial Resource Strain: Medium Risk (06/24/2021)  Social Connections: Socially Isolated (04/29/2024)  Tobacco Use: Low Risk (04/28/2024)    Readmission Risk Interventions    05/01/2024   11:10 AM 05/02/2023   11:52 AM  Readmission Risk Prevention Plan  Transportation Screening Complete Complete  PCP or Specialist Appt within 5-7 Days  Complete  PCP or Specialist Appt within 3-5 Days Complete   Home Care Screening  Complete  Medication Review (RN CM)  Complete  HRI or Home Care Consult Complete   Palliative Care Screening Not Applicable   Medication Review (RN Care Manager) Complete

## 2024-05-03 NOTE — Progress Notes (Signed)
 INFECTIOUS DISEASE PROGRESS NOTE Date of Admission:  04/28/2024     ID: Cynthia Singh is a 83 y.o. female with E coli and candidemia Principal Problem:   Sepsis due to gram-negative UTI [facility]) Active Problems:   Type 2 diabetes mellitus (HCC)   Hyperlipidemia associated with type 2 diabetes mellitus (HCC)   CAD (coronary artery disease)   GERD (gastroesophageal reflux disease)   Stage 3a chronic kidney disease (HCC)   Subjective: No fevers. White blood count 6 6.  ROS  Unable to obtain  Medications:  Antibiotics Given (last 72 hours)     Date/Time Action Medication Dose Rate   04/30/24 1600 New Bag/Given   cefTRIAXone  (ROCEPHIN ) 2 g in sodium chloride  0.9 % 100 mL IVPB 2 g 200 mL/hr   05/01/24 1737 New Bag/Given   cefTRIAXone  (ROCEPHIN ) 2 g in sodium chloride  0.9 % 100 mL IVPB 2 g 200 mL/hr   05/02/24 1611 New Bag/Given   cefTRIAXone  (ROCEPHIN ) 2 g in sodium chloride  0.9 % 100 mL IVPB 2 g 200 mL/hr       amLODipine   2.5 mg Oral Daily   aspirin  EC  81 mg Oral Daily   clopidogrel   75 mg Oral Daily   heparin   5,000 Units Subcutaneous Q8H   nortriptyline   150 mg Oral QHS   polyethylene glycol  17 g Oral Daily   sodium chloride  flush  3 mL Intravenous Q12H    Objective: Vital signs in last 24 hours: Temp:  [97.6 F (36.4 C)-98.3 F (36.8 C)] 98 F (36.7 C) (12/17 0850) Pulse Rate:  [93-98] 94 (12/17 0850) Resp:  [16-18] 18 (12/17 0850) BP: (123-187)/(56-89) 150/69 (12/17 0850) SpO2:  [97 %-100 %] 100 % (12/17 0850) Weight:  [83.7 kg] 83.7 kg (12/17 0500) Physical Exam  Constitutional:  more interactive today, lying in bed, able to answer questions HENT: [place]/AT, PERRLA, no scleral icterus Mouth/Throat: Oropharynx is clear  Cardiovascular: Normal rate, regular rhythm and normal heart sounds.  Pulmonary/Chest: Effort normal and breath sounds normal. No respiratory distress.  has no wheezes.  Neck = supple, no nuchal rigidity Abdominal: Soft. Bowel sounds are  normal.  exhibits no distension. There is no tenderness.  Lymphadenopathy: no cervical adenopathy. No axillary adenopathy Neurological: interactive  Skin: Skin is warm and dry. No rash noted. No erythema.  Psychiatric: a normal mood and affect.  behavior is normal.    Lab Results Recent Labs    05/01/24 0420 05/02/24 0357 05/02/24 1431 05/02/24 1951 05/02/24 2353 05/03/24 0517  WBC 12.0* 11.6*  --   --   --   --   HGB 10.9* 10.6*  --   --   --   --   HCT 33.0* 32.2*  --   --   --   --   NA 134* 129* 128*   < > 128* 130*  K 3.6 3.9 3.9  --   --   --   CL 103 98 96*  --   --   --   CO2 17* 17* 20*  --   --   --   BUN 56* 50* 46*  --   --   --   CREATININE 1.96* 1.77* 1.66*  --   --   --    < > = values in this interval not displayed.    Microbiology: Results for orders placed or performed during the hospital encounter of 04/28/24  Resp panel by RT-PCR (RSV, Flu A&B, Covid) Anterior Nasal Swab  Status: None   Collection Time: 04/28/24  8:30 AM   Specimen: Anterior Nasal Swab  Result Value Ref Range Status   SARS Coronavirus 2 by RT PCR NEGATIVE NEGATIVE Final    Comment: (NOTE) SARS-CoV-2 target nucleic acids are NOT DETECTED.  The SARS-CoV-2 RNA is generally detectable in upper respiratory specimens during the acute phase of infection. The lowest concentration of SARS-CoV-2 viral copies this assay can detect is 138 copies/mL. A negative result does not preclude SARS-Cov-2 infection and should not be used as the sole basis for treatment or other patient management decisions. A negative result may occur with  improper specimen collection/handling, submission of specimen other than nasopharyngeal swab, presence of viral mutation(s) within the areas targeted by this assay, and inadequate number of viral copies(<138 copies/mL). A negative result must be combined with clinical observations, patient history, and epidemiological information. The expected result is  Negative.  Fact Sheet for Patients:  bloggercourse.com  Fact Sheet for Healthcare Providers:  seriousbroker.it  This test is no t yet approved or cleared by the [place]  FDA and  has been authorized for detection and/or diagnosis of SARS-CoV-2 by FDA under an Emergency Use Authorization (EUA). This EUA will remain  in effect (meaning this test can be used) for the duration of the COVID-19 declaration under Section 564(b)(1) of the Act, 21 U.S.C.section 360bbb-3(b)(1), unless the authorization is terminated  or revoked sooner.       Influenza A by PCR NEGATIVE NEGATIVE Final   Influenza B by PCR NEGATIVE NEGATIVE Final    Comment: (NOTE) The Xpert Xpress SARS-CoV-2/FLU/RSV plus assay is intended as an aid in the diagnosis of influenza from Nasopharyngeal swab specimens and should not be used as a sole basis for treatment. Nasal washings and aspirates are unacceptable for Xpert Xpress SARS-CoV-2/FLU/RSV testing.  Fact Sheet for Patients: bloggercourse.com  Fact Sheet for Healthcare Providers: seriousbroker.it  This test is not yet approved or cleared by the [place]  FDA and has been authorized for detection and/or diagnosis of SARS-CoV-2 by FDA under an Emergency Use Authorization (EUA). This EUA will remain in effect (meaning this test can be used) for the duration of the COVID-19 declaration under Section 564(b)(1) of the Act, 21 U.S.C. section 360bbb-3(b)(1), unless the authorization is terminated or revoked.     Resp Syncytial Virus by PCR NEGATIVE NEGATIVE Final    Comment: (NOTE) Fact Sheet for Patients: bloggercourse.com  Fact Sheet for Healthcare Providers: seriousbroker.it  This test is not yet approved or cleared by the [place]  FDA and has been authorized for detection and/or diagnosis of  SARS-CoV-2 by FDA under an Emergency Use Authorization (EUA). This EUA will remain in effect (meaning this test can be used) for the duration of the COVID-19 declaration under Section 564(b)(1) of the Act, 21 U.S.C. section 360bbb-3(b)(1), unless the authorization is terminated or revoked.  Performed at [facility], [address] [place], [place] [address]   Blood Culture (routine x 2)     Status: Abnormal   Collection Time: 04/28/24  8:38 AM   Specimen: BLOOD  Result Value Ref Range Status   Specimen Description   Final    BLOOD BLOOD LEFT ARM Performed at [facility], [address], [place], [place] [address]    Special Requests   Final    BOTTLES DRAWN AEROBIC AND ANAEROBIC Blood Culture adequate volume Performed at [facility], [address], [place], [place] [address]    Culture  Setup Time  Final    IN BOTH AEROBIC AND ANAEROBIC BOTTLES GRAM NEGATIVE RODS CRITICAL RESULT CALLED TO, READ BACK BY AND VERIFIED WITH: NATHAN BELUE @ 04/28/2024 2243 AB Performed at [facility], [address] [place], [place] [address]    Culture ESCHERICHIA COLI (A)  Final   Report Status 05/01/2024 FINAL  Final   Organism ID, Bacteria ESCHERICHIA COLI  Final      Susceptibility   Escherichia coli - MIC*    AMPICILLIN >=32 RESISTANT Resistant     CEFAZOLIN (NON-URINE) 8 RESISTANT Resistant     CEFEPIME  <=0.12 SENSITIVE Sensitive     ERTAPENEM <=0.12 SENSITIVE Sensitive     CEFTRIAXONE  <=0.25 SENSITIVE Sensitive     CIPROFLOXACIN  <=0.06 SENSITIVE Sensitive     GENTAMICIN <=1 SENSITIVE Sensitive     MEROPENEM <=0.25 SENSITIVE Sensitive     TRIMETH /SULFA  <=20 SENSITIVE Sensitive     AMPICILLIN/SULBACTAM >=32 RESISTANT Resistant     PIP/TAZO Value in next row Sensitive      <=4 SENSITIVEThis is a modified FDA-approved test that has been validated and its performance characteristics determined by the reporting laboratory.  This laboratory is  certified under the Clinical Laboratory Improvement Amendments CLIA as qualified to perform high complexity clinical laboratory testing.    * ESCHERICHIA COLI  Blood Culture ID Panel (Reflexed)     Status: Abnormal   Collection Time: 04/28/24  8:38 AM  Result Value Ref Range Status   Enterococcus faecalis NOT DETECTED NOT DETECTED Final   Enterococcus Faecium NOT DETECTED NOT DETECTED Final   Listeria monocytogenes NOT DETECTED NOT DETECTED Final   Staphylococcus species NOT DETECTED NOT DETECTED Final   Staphylococcus aureus (BCID) NOT DETECTED NOT DETECTED Final   Staphylococcus epidermidis NOT DETECTED NOT DETECTED Final   Staphylococcus lugdunensis NOT DETECTED NOT DETECTED Final   Streptococcus species NOT DETECTED NOT DETECTED Final   Streptococcus agalactiae NOT DETECTED NOT DETECTED Final   Streptococcus pneumoniae NOT DETECTED NOT DETECTED Final   Streptococcus pyogenes NOT DETECTED NOT DETECTED Final   A.calcoaceticus-baumannii NOT DETECTED NOT DETECTED Final   Bacteroides fragilis NOT DETECTED NOT DETECTED Final   Enterobacterales DETECTED (A) NOT DETECTED Final    Comment: Enterobacterales represent a large order of gram negative bacteria, not a single organism. CRITICAL RESULT CALLED TO, READ BACK BY AND VERIFIED WITH: NATHAN BELUE @ 04/28/2024 2243 AB    Enterobacter cloacae complex NOT DETECTED NOT DETECTED Final   Escherichia coli DETECTED (A) NOT DETECTED Final    Comment: CRITICAL RESULT CALLED TO, READ BACK BY AND VERIFIED WITH: NATHAN BELUE @ 04/28/2024 2243 AB    Klebsiella aerogenes NOT DETECTED NOT DETECTED Final   Klebsiella oxytoca NOT DETECTED NOT DETECTED Final   Klebsiella pneumoniae NOT DETECTED NOT DETECTED Final   Proteus species NOT DETECTED NOT DETECTED Final   Salmonella species NOT DETECTED NOT DETECTED Final   Serratia marcescens NOT DETECTED NOT DETECTED Final   Haemophilus influenzae NOT DETECTED NOT DETECTED Final   Neisseria meningitidis NOT  DETECTED NOT DETECTED Final   Pseudomonas aeruginosa NOT DETECTED NOT DETECTED Final   Stenotrophomonas maltophilia NOT DETECTED NOT DETECTED Final   Candida albicans NOT DETECTED NOT DETECTED Final   Candida auris NOT DETECTED NOT DETECTED Final   Candida glabrata NOT DETECTED NOT DETECTED Final   Candida krusei NOT DETECTED NOT DETECTED Final   Candida parapsilosis NOT DETECTED NOT DETECTED Final   Candida tropicalis NOT DETECTED NOT DETECTED Final   Cryptococcus neoformans/gattii NOT DETECTED NOT  DETECTED Final   CTX-M ESBL NOT DETECTED NOT DETECTED Final   Carbapenem resistance IMP NOT DETECTED NOT DETECTED Final   Carbapenem resistance KPC NOT DETECTED NOT DETECTED Final   Carbapenem resistance NDM NOT DETECTED NOT DETECTED Final   Carbapenem resist OXA 48 LIKE NOT DETECTED NOT DETECTED Final   Carbapenem resistance VIM NOT DETECTED NOT DETECTED Final    Comment: Performed at [facility], [address] [place], [place] [address]  Urine Culture (for pregnant, neutropenic or urologic patients or patients with an indwelling urinary catheter)     Status: Abnormal   Collection Time: 04/28/24 11:03 AM   Specimen: Urine, Clean Catch  Result Value Ref Range Status   Specimen Description   Final    URINE, CLEAN CATCH Performed at [facility], [address] [place], [place] [address]    Special Requests   Final    NONE Performed at [facility], [address], [place], [place] [address]    Culture 50,000 COLONIES/mL ESCHERICHIA COLI (A)  Final   Report Status 04/30/2024 FINAL  Final   Organism ID, Bacteria ESCHERICHIA COLI (A)  Final      Susceptibility   Escherichia coli - MIC*    AMPICILLIN >=32 RESISTANT Resistant     CEFAZOLIN (URINE) Value in next row Sensitive      8 SENSITIVEThis is a modified FDA-approved test that has been validated and its performance characteristics determined by the reporting laboratory.  This laboratory is certified  under the Clinical Laboratory Improvement Amendments CLIA as qualified to perform high complexity clinical laboratory testing.    CEFEPIME  Value in next row Sensitive      8 SENSITIVEThis is a modified FDA-approved test that has been validated and its performance characteristics determined by the reporting laboratory.  This laboratory is certified under the Clinical Laboratory Improvement Amendments CLIA as qualified to perform high complexity clinical laboratory testing.    ERTAPENEM Value in next row Sensitive      8 SENSITIVEThis is a modified FDA-approved test that has been validated and its performance characteristics determined by the reporting laboratory.  This laboratory is certified under the Clinical Laboratory Improvement Amendments CLIA as qualified to perform high complexity clinical laboratory testing.    CEFTRIAXONE  Value in next row Sensitive      8 SENSITIVEThis is a modified FDA-approved test that has been validated and its performance characteristics determined by the reporting laboratory.  This laboratory is certified under the Clinical Laboratory Improvement Amendments CLIA as qualified to perform high complexity clinical laboratory testing.    CIPROFLOXACIN  Value in next row Sensitive      8 SENSITIVEThis is a modified FDA-approved test that has been validated and its performance characteristics determined by the reporting laboratory.  This laboratory is certified under the Clinical Laboratory Improvement Amendments CLIA as qualified to perform high complexity clinical laboratory testing.    GENTAMICIN Value in next row Sensitive      8 SENSITIVEThis is a modified FDA-approved test that has been validated and its performance characteristics determined by the reporting laboratory.  This laboratory is certified under the Clinical Laboratory Improvement Amendments CLIA as qualified to perform high complexity clinical laboratory testing.    NITROFURANTOIN Value in next row Sensitive       8 SENSITIVEThis is a modified FDA-approved test that has been validated and its performance characteristics determined by the reporting laboratory.  This laboratory is certified under the Clinical Laboratory Improvement Amendments CLIA as qualified to  perform high complexity clinical laboratory testing.    TRIMETH /SULFA  Value in next row Sensitive      8 SENSITIVEThis is a modified FDA-approved test that has been validated and its performance characteristics determined by the reporting laboratory.  This laboratory is certified under the Clinical Laboratory Improvement Amendments CLIA as qualified to perform high complexity clinical laboratory testing.    AMPICILLIN/SULBACTAM Value in next row Intermediate      8 SENSITIVEThis is a modified FDA-approved test that has been validated and its performance characteristics determined by the reporting laboratory.  This laboratory is certified under the Clinical Laboratory Improvement Amendments CLIA as qualified to perform high complexity clinical laboratory testing.    PIP/TAZO Value in next row Sensitive      <=4 SENSITIVEThis is a modified FDA-approved test that has been validated and its performance characteristics determined by the reporting laboratory.  This laboratory is certified under the Clinical Laboratory Improvement Amendments CLIA as qualified to perform high complexity clinical laboratory testing.    MEROPENEM Value in next row Sensitive      <=4 SENSITIVEThis is a modified FDA-approved test that has been validated and its performance characteristics determined by the reporting laboratory.  This laboratory is certified under the Clinical Laboratory Improvement Amendments CLIA as qualified to perform high complexity clinical laboratory testing.    * 50,000 COLONIES/mL ESCHERICHIA COLI  Blood Culture (routine x 2)     Status: Abnormal (Preliminary result)   Collection Time: 04/28/24  1:46 PM   Specimen: BLOOD LEFT WRIST  Result Value Ref Range  Status   Specimen Description   Final    BLOOD LEFT WRIST Performed at [facility], [address] [place], [place] [address]    Special Requests   Final    BOTTLES DRAWN AEROBIC AND ANAEROBIC Blood Culture adequate volume Performed at [facility], [address] [place], [place] [address]    Culture  Setup Time (A)  Final    YEAST AEROBIC BOTTLE ONLY CRITICAL RESULT CALLED TO, READ BACK BY AND VERIFIED WITH: NATHAN BELUE @ 04/30/2024 0216 AB    Culture (A)  Final    CANDIDA ALBICANS Sent to [facility] for further susceptibility testing. Performed at [facility], [address] [place], [place] [address]    Report Status PENDING  Incomplete  Blood Culture ID Panel (Reflexed)     Status: Abnormal   Collection Time: 04/28/24  1:46 PM  Result Value Ref Range Status   Enterococcus faecalis NOT DETECTED NOT DETECTED Final   Enterococcus Faecium NOT DETECTED NOT DETECTED Final   Listeria monocytogenes NOT DETECTED NOT DETECTED Final   Staphylococcus species NOT DETECTED NOT DETECTED Final   Staphylococcus aureus (BCID) NOT DETECTED NOT DETECTED Final   Staphylococcus epidermidis NOT DETECTED NOT DETECTED Final   Staphylococcus lugdunensis NOT DETECTED NOT DETECTED Final   Streptococcus species NOT DETECTED NOT DETECTED Final   Streptococcus agalactiae NOT DETECTED NOT DETECTED Final   Streptococcus pneumoniae NOT DETECTED NOT DETECTED Final   Streptococcus pyogenes NOT DETECTED NOT DETECTED Final   A.calcoaceticus-baumannii NOT DETECTED NOT DETECTED Final   Bacteroides fragilis NOT DETECTED NOT DETECTED Final   Enterobacterales NOT DETECTED NOT DETECTED Final   Enterobacter cloacae complex NOT DETECTED NOT DETECTED Final   Escherichia coli NOT DETECTED NOT DETECTED Final   Klebsiella aerogenes NOT DETECTED NOT DETECTED Final   Klebsiella oxytoca NOT DETECTED NOT DETECTED Final   Klebsiella pneumoniae NOT DETECTED NOT DETECTED Final   Proteus  species NOT  DETECTED NOT DETECTED Final   Salmonella species NOT DETECTED NOT DETECTED Final   Serratia marcescens NOT DETECTED NOT DETECTED Final   Haemophilus influenzae NOT DETECTED NOT DETECTED Final   Neisseria meningitidis NOT DETECTED NOT DETECTED Final   Pseudomonas aeruginosa NOT DETECTED NOT DETECTED Final   Stenotrophomonas maltophilia NOT DETECTED NOT DETECTED Final   Candida albicans DETECTED (A) NOT DETECTED Final    Comment: CRITICAL RESULT CALLED TO, READ BACK BY AND VERIFIED WITH: NATHAN BELUE @ 04/30/2024 0216 AB    Candida auris NOT DETECTED NOT DETECTED Final   Candida glabrata DETECTED (A) NOT DETECTED Final    Comment: CRITICAL RESULT CALLED TO, READ BACK BY AND VERIFIED WITH: NATHAN BELUE @ 04/30/2024 0216 AB    Candida krusei NOT DETECTED NOT DETECTED Final   Candida parapsilosis NOT DETECTED NOT DETECTED Final   Candida tropicalis NOT DETECTED NOT DETECTED Final   Cryptococcus neoformans/gattii NOT DETECTED NOT DETECTED Final    Comment: Performed at [facility], [address] [place], [place] [address]  Culture, blood (Routine X 2) w Reflex to ID Panel     Status: None (Preliminary result)   Collection Time: 05/01/24  3:16 PM   Specimen: BLOOD  Result Value Ref Range Status   Specimen Description BLOOD BLOOD LEFT ARM  Final   Special Requests   Final    BOTTLES DRAWN AEROBIC AND ANAEROBIC Blood Culture adequate volume   Culture   Final    NO GROWTH 2 DAYS Performed at [facility], [address] [place], [place] [address]    Report Status PENDING  Incomplete  Culture, blood (Routine X 2) w Reflex to ID Panel     Status: None (Preliminary result)   Collection Time: 05/01/24  3:17 PM   Specimen: BLOOD  Result Value Ref Range Status   Specimen Description BLOOD BLOOD LEFT HAND  Final   Special Requests AEROBIC BOTTLE ONLY Blood Culture adequate volume  Final   Culture   Final    NO GROWTH 2 DAYS Performed at [facility], [address] [place], [place] [address]    Report Status PENDING  Incomplete    Studies/Results: ECHOCARDIOGRAM COMPLETE Result Date: 05/02/2024    ECHOCARDIOGRAM REPORT   Patient Name:   Cynthia Singh Date of Exam: 05/02/2024 Medical Rec #:  [id number]        Height:       68.0 in Accession #:    [id number]       Weight:       179.9 lb Date of Birth:  02/28/1941       BSA:          1.954 m Patient Age:    82 years         BP:           125/58 mmHg Patient Gender: F                HR:           88 bpm. Exam Location:  [facility] Procedure: 2D Echo, Cardiac Doppler, Color Doppler and Intracardiac            Opacification Agent (Both Spectral and Color Flow Doppler were            utilized during procedure). Indications:     Bacteremia  History:         Patient has prior history of Echocardiogram examinations, most  recent 09/05/2019. CAD; Risk Factors:Diabetes, Dyslipidemia and                  Hypertension. CKD.  Sonographer:     Philomena Daring Referring Phys:  [id number] Disney P Colter Magowan Diagnosing Phys: Lonni Hanson MD IMPRESSIONS  1. Left ventricular ejection fraction, by estimation, is 55 to 60%. The left ventricle has normal function. The left ventricle has no regional wall motion abnormalities. There is mild left ventricular hypertrophy. Left ventricular diastolic parameters are consistent with Grade I diastolic dysfunction (impaired relaxation).  2. Right ventricular systolic function is normal. The right ventricular size is normal. There is normal pulmonary artery systolic pressure.  3. The mitral valve is degenerative. No evidence of mitral valve regurgitation. Mild mitral stenosis. Moderate mitral annular calcification.  4. The aortic valve has an indeterminant number of cusps. There is mild calcification of the aortic valve. There is mild thickening of the aortic valve. Aortic valve regurgitation is not visualized. Aortic valve sclerosis/calcification is present, without any evidence of  aortic stenosis.  5. The inferior vena cava is normal in size with greater than 50% respiratory variability, suggesting right atrial pressure of 3 mmHg. Conclusion(s)/Recommendation(s): No evidence of valvular vegetations on this transthoracic echocardiogram. Consider a transesophageal echocardiogram to exclude infective endocarditis if clinically indicated. FINDINGS  Left Ventricle: Left ventricular ejection fraction, by estimation, is 55 to 60%. The left ventricle has normal function. The left ventricle has no regional wall motion abnormalities. Definity  contrast agent was given IV to delineate the left ventricular  endocardial borders. The left ventricular internal cavity size was normal in size. There is mild left ventricular hypertrophy. Left ventricular diastolic parameters are consistent with Grade I diastolic dysfunction (impaired relaxation). Right Ventricle: The right ventricular size is normal. Right vetricular wall thickness was not well visualized. Right ventricular systolic function is normal. There is normal pulmonary artery systolic pressure. The tricuspid regurgitant velocity is 1.24 m/s, and with an assumed right atrial pressure of 3 mmHg, the estimated right ventricular systolic pressure is 9.2 mmHg. Left Atrium: Left atrial size was normal in size. Right Atrium: Right atrial size was normal in size. Pericardium: The pericardium was not well visualized. Mitral Valve: The mitral valve is degenerative in appearance. There is mild thickening of the mitral valve leaflet(s). Moderate mitral annular calcification. No evidence of mitral valve regurgitation. Mild mitral valve stenosis. The mean mitral valve gradient is 5.0 mmHg. Tricuspid Valve: The tricuspid valve is not well visualized. Tricuspid valve regurgitation is not demonstrated. Aortic Valve: The aortic valve has an indeterminant number of cusps. There is mild calcification of the aortic valve. There is mild thickening of the aortic valve.  Aortic valve regurgitation is not visualized. Aortic valve sclerosis/calcification is present, without any evidence of aortic stenosis. Aortic valve mean gradient measures 6.0 mmHg. Aortic valve peak gradient measures 11.6 mmHg. Aortic valve area, by VTI measures 2.22 cm. Pulmonic Valve: The pulmonic valve was not well visualized. Pulmonic valve regurgitation is not visualized. No evidence of pulmonic stenosis. Aorta: The aortic root is normal in size and structure. Venous: The inferior vena cava is normal in size with greater than 50% respiratory variability, suggesting right atrial pressure of 3 mmHg. IAS/Shunts: The interatrial septum was not well visualized.  LEFT VENTRICLE PLAX 2D LVIDd:         4.50 cm   Diastology LVIDs:         3.00 cm   LV e' medial:    4.24 cm/s LV PW:  1.20 cm   LV E/e' medial:  21.6 LV IVS:        1.30 cm   LV e' lateral:   6.74 cm/s LVOT diam:     2.10 cm   LV E/e' lateral: 13.6 LV SV:         70 LV SV Index:   36 LVOT Area:     3.46 cm  RIGHT VENTRICLE             IVC RV S prime:     17.50 cm/s  IVC diam: 1.70 cm TAPSE (M-mode): 2.9 cm LEFT ATRIUM             Index        RIGHT ATRIUM           Index LA diam:        3.20 cm 1.64 cm/m   RA Area:     13.40 cm LA Vol (A2C):   53.6 ml 27.43 ml/m  RA Volume:   29.80 ml  15.25 ml/m LA Vol (A4C):   34.0 ml 17.40 ml/m LA Biplane Vol: 42.8 ml 21.91 ml/m  AORTIC VALVE AV Area (Vmax):    2.36 cm AV Area (Vmean):   2.26 cm AV Area (VTI):     2.22 cm AV Vmax:           170.00 cm/s AV Vmean:          117.000 cm/s AV VTI:            0.317 m AV Peak Grad:      11.6 mmHg AV Mean Grad:      6.0 mmHg LVOT Vmax:         116.00 cm/s LVOT Vmean:        76.500 cm/s LVOT VTI:          0.203 m LVOT/AV VTI ratio: 0.64  AORTA Ao Root diam: 3.00 cm MITRAL VALVE                TRICUSPID VALVE MV Area (PHT): 4.29 cm     TR Peak grad:   6.2 mmHg MV Mean grad:  5.0 mmHg     TR Vmax:        124.00 cm/s MV Decel Time: 177 msec MV E velocity: 91.40  cm/s   SHUNTS MV A velocity: 149.00 cm/s  Systemic VTI:  0.20 m MV E/A ratio:  0.61         Systemic Diam: 2.10 cm Lonni Hanson MD Electronically signed by Lonni Hanson MD Signature Date/Time: 05/02/2024/5:22:15 PM    Final     Assessment/Plan: Cynthia Singh is a 83 y.o. female with multiple medical problems admitted with NV weakness and found to have urosepsis with E coli in blood and urine and surprising 2 forms of candida on one set of BCX.    May 02, 2024- clinically improving.  Day 6 of  antibiotic treatment.  Day 4 of antifungal.  Recommendations Urosepsis with E. coli in urine and blood-clinically improving.  She had some gas noted in her urinary system. Will finish 7 days tomorrow tomorrow for this so can dc abx at that time.  Positive blood culture for Candida 2 types-I suspect this is contaminant.  Unfortunately she did not have blood cultures repeated prior to starting antifungals. She is not a typical patient for candidemia with no indwelling lines or pacemakers Echo neg for vegetation   At dc can change to fluconazole  200 mg for  a total 10 day course of antifungal - stop date would be 12/23  Thank you very much for the consult. Will follow with you.  Alm SHAUNNA Needle   05/03/2024, 2:34 PM

## 2024-05-03 NOTE — Plan of Care (Signed)

## 2024-05-03 NOTE — Plan of Care (Signed)
°  Problem: Metabolic: Goal: Ability to maintain appropriate glucose levels will improve Outcome: Not Progressing   Problem: Skin Integrity: Goal: Risk for impaired skin integrity will decrease Outcome: Not Progressing   Problem: Activity: Goal: Risk for activity intolerance will decrease Outcome: Not Progressing   Problem: Nutrition: Goal: Adequate nutrition will be maintained Outcome: Not Progressing   Problem: Safety: Goal: Ability to remain free from injury will improve Outcome: Not Progressing

## 2024-05-03 NOTE — Progress Notes (Signed)
 Patient has wallet in the room with $224.00 in it. Patient wants wallet to be locked up in her room. Will zip tie in the cabinet in a security bag

## 2024-05-03 NOTE — Progress Notes (Signed)
 Physical Therapy Treatment Patient Details Name: Cynthia Singh MRN: [id number] DOB: 07-14-1940 Today's Date: 05/03/2024   History of Present Illness 83 y.o. female admitted with sepsis & acute metabolic encephalopathy due to UTI. Also found to have subacute vertebral fracture L1, T5, T12, no neurosurgical intervention, TLSO brace when OOB, ok to take off with shower. PMH significant for diabetes, HTN, GERD, CKD, MI, obesity, CAD, OA    PT Comments  Patient agreeable to PT session this pm. States she still does not feel very well, but will try. Requires min/mod A for supine to sit. Upon sitting up on side of bed patient reports she needs to have BM. Patient assisted with transfer to [facility] with +1 mod A. Patient returned to bed with needs met. RN notified that patient is requesting pain medicine. She will continue to benefit from skilled PT to improve mobility and independence.    If plan is discharge home, recommend the following: A lot of help with walking and/or transfers;A lot of help with bathing/dressing/bathroom;Assistance with cooking/housework;Help with stairs or ramp for entrance;Assist for transportation   Can travel by private vehicle     No  Equipment Recommendations  Other (comment) (TBD)    Recommendations for Other Services       Precautions / Restrictions Precautions Precautions: Fall;Back Recall of Precautions/Restrictions: Impaired Required Braces or Orthoses: Spinal Brace Spinal Brace: Thoracolumbosacral orthotic Spinal Brace Comments: when OOB Restrictions Weight Bearing Restrictions Per Provider Order: No     Mobility  Bed Mobility Overal bed mobility: Needs Assistance Bed Mobility: Rolling, Sidelying to Sit, Sit to Supine Rolling: Min assist Sidelying to sit: Min assist   Sit to supine: Min assist   General bed mobility comments: patient with no recollection of log rolling in bed. Cues needed for hand placement, optimization of movement     Transfers Overall transfer level: Needs assistance Equipment used: 1 person hand held assist Transfers: Sit to/from Stand, Bed to chair/wheelchair/BSC Sit to Stand: Mod assist   Step pivot transfers: Mod assist            Ambulation/Gait               General Gait Details: unable   Stairs             Wheelchair Mobility     Tilt Bed    Modified Rankin (Stroke Patients Only)       Balance Overall balance assessment: Needs assistance Sitting-balance support: Feet supported Sitting balance-Leahy Scale: Good     Standing balance support: Bilateral upper extremity supported, During functional activity, Reliant on assistive device for balance Standing balance-Leahy Scale: Fair                              Hotel Manager: Impaired Factors Affecting Communication: Difficulty expressing self  Cognition Arousal: Alert Behavior During Therapy: WFL for tasks assessed/performed   PT - Cognitive impairments: Problem solving, Sequencing, Safety/Judgement                         Following commands: Impaired Following commands impaired: Follows one step commands inconsistently, Follows one step commands with increased time    Cueing Cueing Techniques: Verbal cues, Gestural cues, Tactile cues  Exercises      General Comments        Pertinent Vitals/Pain Pain Assessment Pain Assessment: Faces Faces Pain Scale: Hurts little more Pain Location: back/ legs Pain  Descriptors / Indicators: Discomfort, Sore Pain Intervention(s): Patient requesting pain meds-RN notified, Repositioned    Home Living                          Prior Function            PT Goals (current goals can now be found in the care plan section) Acute Rehab PT Goals Patient Stated Goal: none stated PT Goal Formulation: With patient Time For Goal Achievement: 05/13/24 Potential to Achieve Goals: Fair Progress towards PT  goals: Progressing toward goals    Frequency    Min 2X/week      PT Plan      Co-evaluation              AM-PAC PT 6 Clicks Mobility   Outcome Measure  Help needed turning from your back to your side while in a flat bed without using bedrails?: A Little Help needed moving from lying on your back to sitting on the side of a flat bed without using bedrails?: A Lot Help needed moving to and from a bed to a chair (including a wheelchair)?: A Lot Help needed standing up from a chair using your arms (e.g., wheelchair or bedside chair)?: A Lot Help needed to walk in hospital room?: Total Help needed climbing 3-5 steps with a railing? : Total 6 Click Score: 11    End of Session   Activity Tolerance: Patient limited by pain;Patient limited by fatigue Patient left: in bed;with call bell/phone within reach;with bed alarm set Nurse Communication: Mobility status;Other (comment) (requesting pain meds and needs new pure wick) PT Visit Diagnosis: Unsteadiness on feet (R26.81);Muscle weakness (generalized) (M62.81);Difficulty in walking, not elsewhere classified (R26.2);Pain;Other abnormalities of gait and mobility (R26.89) Pain - Right/Left:  (bilateral) Pain - part of body: Leg (back)     Time: 8553-8497 PT Time Calculation (min) (ACUTE ONLY): 16 min  Charges:    $Therapeutic Activity: 8-22 mins PT General Charges $$ ACUTE PT VISIT: 1 Visit                     Tya Haughey, PT, GCS 05/03/2024,3:11 PM

## 2024-05-03 NOTE — Progress Notes (Signed)
 PT Cancellation Note  Patient Details Name: Cynthia Singh MRN: [id number] DOB: April 25, 1941   Cancelled Treatment:    Reason Eval/Treat Not Completed: Patient declined, no reason specified Despite encouragement and pre- medication, patient refusing any PT at all, bed mobility/repositioning due to reported abdominal pain. Will re-attempt at later time/date.  [name] 05/03/2024, 11:12 AM

## 2024-05-03 NOTE — Progress Notes (Signed)
 PROGRESS NOTE    ERNESTA TRABERT  FMW:[id number] DOB: [birth year] DOA: 04/28/2024 PCP: Wendee Lynwood HERO, NP   Assessment & Plan:   Principal Problem:   Sepsis due to gram-negative UTI [facility]) Active Problems:   CAD (coronary artery disease)   Hyperlipidemia associated with type 2 diabetes mellitus (HCC)   Type 2 diabetes mellitus (HCC)   GERD (gastroesophageal reflux disease)   Stage 3a chronic kidney disease (HCC)  Assessment and Plan: Severe sepsis: secondary to UTI. Sepsis resolved  UTI: urine cx growing e.coli. Continue on IV rocpehin as per ID  Bacteremia: secondary to UTI. Blood cxs growing e.coli. Continue on IV rocephin  as per ID. Repeat blood cxs NGTD  Unlikely fungemia: blood cx grew candida albicans, candida glabrata. Continue on micafungin  as per ID. Repeat blood cxs NGTD.  Hematuria : likely secondary to UTI. Will continue to monitor   Lactic acidosis: resolved  Leukocytosis: trending down. Will continue to monitor   Possible urinary retention / vesicoureteral reflux: w/ leaking around foley which has since been d/c. Bladder scan prn   AKI on CKDIIIa: Cr is trending down. Avoid nephrotoxic meds   Hyponatremia: labile. Will continue to monitor   Subacute vertebral fracture L1, T12: likely secondary to fall at home. TLSO brace when out of bed. No acute intervention as per neuro surg. Will need to f/u outpatient w/ neuro surg   Transaminitis: resolved. Etiology unclear   HTN: continue on home dose of amlodipine    DM2: fair control, HbA1c 7.1 in 02/2024. Continue on SSI w/ accuchecks   Likely peripheral neuropathy: continue on home dose of nortriptyline     HLD: holding statin  Hx of CAD: continue on home dose of aspririn, plavix . Holding statin   Recent fall: PT/OT recs SNF.    Acute metabolic encephalopathy: secondary to UTI/bacteremia and uremia. Mental status is back to baseline   Overweight: BMI 28.0. Would benefit from weight loss      DVT  prophylaxis: heparin   Code Status: full  Family Communication: called pt's grandson, Lamar, but no answer & unable to leave a voicemail Disposition Plan: likely d/c to SNF  Level of care: Med-Surg  Status is: Inpatient Remains inpatient appropriate because: severity of illness    Consultants:  ID  Procedures:   Antimicrobials: rocephin    Subjective: Pt c/o b/l leg pain   Objective: Vitals:   05/03/24 0252 05/03/24 0351 05/03/24 0500 05/03/24 0850  BP: (!) 187/87 (!) 123/56  (!) 150/69  Pulse: 98   94  Resp: 16   18  Temp: 97.6 F (36.4 C)   98 F (36.7 C)  TempSrc: Oral   Oral  SpO2: 98%   100%  Weight:   83.7 kg   Height:        Intake/Output Summary (Last 24 hours) at 05/03/2024 1042 Last data filed at 05/03/2024 0301 Gross per 24 hour  Intake 1135.75 ml  Output 1050 ml  Net 85.75 ml   Filed Weights   05/01/24 0417 05/02/24 0500 05/03/24 0500  Weight: 77.3 kg 81.6 kg 83.7 kg    Examination:  General exam: Appears calm but uncomfortable  Respiratory system: Clear to auscultation. Respiratory effort normal. Cardiovascular system: S1 & S2 +. No rubs, gallops or clicks.  Gastrointestinal system: Abdomen is nondistended, soft and nontender. Normal bowel sounds heard. Central nervous system: Alert and oriented. Moves all extremities Psychiatry: Judgement and insight appears at baseline. Flat mood and affect   Data Reviewed: I have personally reviewed following labs  and imaging studies  CBC: Recent Labs  Lab 04/28/24 0838 04/29/24 0345 04/30/24 0345 05/01/24 0420 05/02/24 0357  WBC 30.3* 19.1* 18.5* 12.0* 11.6*  NEUTROABS 25.4*  --   --   --   --   HGB 11.4* 10.9* 11.2* 10.9* 10.6*  HCT 35.6* 32.8* 35.2* 33.0* 32.2*  MCV 89.2 87.7 90.7 86.6 84.7  PLT 181 146* 132* 125* 157   Basic Metabolic Panel: Recent Labs  Lab 04/28/24 0838 04/29/24 0345 04/30/24 0345 05/01/24 0420 05/02/24 0357 05/02/24 1431 05/02/24 1951 05/02/24 2353  05/03/24 0517  NA 134* 139 140 134* 129* 128* 126* 128* 130*  K 4.7 4.4 5.0 3.6 3.9 3.9  --   --   --   CL 99 108 109 103 98 96*  --   --   --   CO2 15* 17* 18* 17* 17* 20*  --   --   --   GLUCOSE 179* 134* 87 93 114* 151*  --   --   --   BUN 55* 59* 62* 56* 50* 46*  --   --   --   CREATININE 3.27* 2.90* 2.38* 1.96* 1.77* 1.66*  --   --   --   CALCIUM  7.7* 8.3* 8.6* 8.3* 7.9* 8.2*  --   --   --   MG 1.5*  --   --   --   --   --   --   --   --    GFR: Estimated Creatinine Clearance: 30.6 mL/min (A) (by C-G formula based on SCr of 1.66 mg/dL (H)). Liver Function Tests: Recent Labs  Lab 04/28/24 0838 04/30/24 0345 05/02/24 1431  AST 45* 20 20  ALT 71* 24 13  ALKPHOS 165* 165* 148*  BILITOT 0.7 0.3 0.3  PROT 6.3* 6.3* 5.8*  ALBUMIN 3.0* 2.6* 2.5*   Recent Labs  Lab 04/28/24 9161  LIPASE <10*   No results for input(s): AMMONIA in the last 168 hours. Coagulation Profile: Recent Labs  Lab 04/28/24 1346  INR 1.4*   Cardiac Enzymes: No results for input(s): CKTOTAL, CKMB, CKMBINDEX, TROPONINI in the last 168 hours. BNP (last 3 results) No results for input(s): PROBNP in the last 8760 hours. HbA1C: No results for input(s): HGBA1C in the last 72 hours. CBG: Recent Labs  Lab 04/30/24 2000 05/01/24 0756 05/01/24 1157 05/01/24 1702 05/03/24 0849  GLUCAP 84 93 142* 106* 123*   Lipid Profile: No results for input(s): CHOL, HDL, LDLCALC, TRIG, CHOLHDL, LDLDIRECT in the last 72 hours. Thyroid Function Tests: No results for input(s): TSH, T4TOTAL, FREET4, T3FREE, THYROIDAB in the last 72 hours. Anemia Panel: No results for input(s): VITAMINB12, FOLATE, FERRITIN, TIBC, IRON , RETICCTPCT in the last 72 hours. Sepsis Labs: Recent Labs  Lab 04/28/24 9161 04/28/24 1108 04/28/24 1630  LATICACIDVEN 4.6* 2.8* 1.9    Recent Results (from the past 240 hours)  Resp panel by RT-PCR (RSV, Flu A&B, Covid) Anterior Nasal Swab      Status: None   Collection Time: 04/28/24  8:30 AM   Specimen: Anterior Nasal Swab  Result Value Ref Range Status   SARS Coronavirus 2 by RT PCR NEGATIVE NEGATIVE Final    Comment: (NOTE) SARS-CoV-2 target nucleic acids are NOT DETECTED.  The SARS-CoV-2 RNA is generally detectable in upper respiratory specimens during the acute phase of infection. The lowest concentration of SARS-CoV-2 viral copies this assay can detect is 138 copies/mL. A negative result does not preclude SARS-Cov-2 infection and should not be used  as the sole basis for treatment or other patient management decisions. A negative result may occur with  improper specimen collection/handling, submission of specimen other than nasopharyngeal swab, presence of viral mutation(s) within the areas targeted by this assay, and inadequate number of viral copies(<138 copies/mL). A negative result must be combined with clinical observations, patient history, and epidemiological information. The expected result is Negative.  Fact Sheet for Patients:  bloggercourse.com  Fact Sheet for Healthcare Providers:  seriousbroker.it  This test is no t yet approved or cleared by the [place]  FDA and  has been authorized for detection and/or diagnosis of SARS-CoV-2 by FDA under an Emergency Use Authorization (EUA). This EUA will remain  in effect (meaning this test can be used) for the duration of the COVID-19 declaration under Section 564(b)(1) of the Act, 21 U.S.C.section 360bbb-3(b)(1), unless the authorization is terminated  or revoked sooner.       Influenza A by PCR NEGATIVE NEGATIVE Final   Influenza B by PCR NEGATIVE NEGATIVE Final    Comment: (NOTE) The Xpert Xpress SARS-CoV-2/FLU/RSV plus assay is intended as an aid in the diagnosis of influenza from Nasopharyngeal swab specimens and should not be used as a sole basis for treatment. Nasal washings and aspirates are  unacceptable for Xpert Xpress SARS-CoV-2/FLU/RSV testing.  Fact Sheet for Patients: bloggercourse.com  Fact Sheet for Healthcare Providers: seriousbroker.it  This test is not yet approved or cleared by the [place]  FDA and has been authorized for detection and/or diagnosis of SARS-CoV-2 by FDA under an Emergency Use Authorization (EUA). This EUA will remain in effect (meaning this test can be used) for the duration of the COVID-19 declaration under Section 564(b)(1) of the Act, 21 U.S.C. section 360bbb-3(b)(1), unless the authorization is terminated or revoked.     Resp Syncytial Virus by PCR NEGATIVE NEGATIVE Final    Comment: (NOTE) Fact Sheet for Patients: bloggercourse.com  Fact Sheet for Healthcare Providers: seriousbroker.it  This test is not yet approved or cleared by the [place]  FDA and has been authorized for detection and/or diagnosis of SARS-CoV-2 by FDA under an Emergency Use Authorization (EUA). This EUA will remain in effect (meaning this test can be used) for the duration of the COVID-19 declaration under Section 564(b)(1) of the Act, 21 U.S.C. section 360bbb-3(b)(1), unless the authorization is terminated or revoked.  Performed at [facility], [address] [place], [place] [address]   Blood Culture (routine x 2)     Status: Abnormal   Collection Time: 04/28/24  8:38 AM   Specimen: BLOOD  Result Value Ref Range Status   Specimen Description   Final    BLOOD BLOOD LEFT ARM Performed at [facility], [address] [place], [place] [address]    Special Requests   Final    BOTTLES DRAWN AEROBIC AND ANAEROBIC Blood Culture adequate volume Performed at [facility], [address] [place], [place] [address]    Culture  Setup Time   Final    IN BOTH AEROBIC AND ANAEROBIC BOTTLES GRAM NEGATIVE RODS CRITICAL RESULT  CALLED TO, READ BACK BY AND VERIFIED WITH: NATHAN BELUE @ 04/28/2024 2243 AB Performed at [facility], [address], [place], [place] [address]    Culture ESCHERICHIA COLI (A)  Final   Report Status 05/01/2024 FINAL  Final   Organism ID, Bacteria ESCHERICHIA COLI  Final      Susceptibility   Escherichia coli - MIC*    AMPICILLIN >=32 RESISTANT Resistant  CEFAZOLIN (NON-URINE) 8 RESISTANT Resistant     CEFEPIME  <=0.12 SENSITIVE Sensitive     ERTAPENEM <=0.12 SENSITIVE Sensitive     CEFTRIAXONE  <=0.25 SENSITIVE Sensitive     CIPROFLOXACIN  <=0.06 SENSITIVE Sensitive     GENTAMICIN <=1 SENSITIVE Sensitive     MEROPENEM <=0.25 SENSITIVE Sensitive     TRIMETH /SULFA  <=20 SENSITIVE Sensitive     AMPICILLIN/SULBACTAM >=32 RESISTANT Resistant     PIP/TAZO Value in next row Sensitive      <=4 SENSITIVEThis is a modified FDA-approved test that has been validated and its performance characteristics determined by the reporting laboratory.  This laboratory is certified under the Clinical Laboratory Improvement Amendments CLIA as qualified to perform high complexity clinical laboratory testing.    * ESCHERICHIA COLI  Blood Culture ID Panel (Reflexed)     Status: Abnormal   Collection Time: 04/28/24  8:38 AM  Result Value Ref Range Status   Enterococcus faecalis NOT DETECTED NOT DETECTED Final   Enterococcus Faecium NOT DETECTED NOT DETECTED Final   Listeria monocytogenes NOT DETECTED NOT DETECTED Final   Staphylococcus species NOT DETECTED NOT DETECTED Final   Staphylococcus aureus (BCID) NOT DETECTED NOT DETECTED Final   Staphylococcus epidermidis NOT DETECTED NOT DETECTED Final   Staphylococcus lugdunensis NOT DETECTED NOT DETECTED Final   Streptococcus species NOT DETECTED NOT DETECTED Final   Streptococcus agalactiae NOT DETECTED NOT DETECTED Final   Streptococcus pneumoniae NOT DETECTED NOT DETECTED Final   Streptococcus pyogenes NOT DETECTED NOT DETECTED Final    A.calcoaceticus-baumannii NOT DETECTED NOT DETECTED Final   Bacteroides fragilis NOT DETECTED NOT DETECTED Final   Enterobacterales DETECTED (A) NOT DETECTED Final    Comment: Enterobacterales represent a large order of gram negative bacteria, not a single organism. CRITICAL RESULT CALLED TO, READ BACK BY AND VERIFIED WITH: NATHAN BELUE @ 04/28/2024 2243 AB    Enterobacter cloacae complex NOT DETECTED NOT DETECTED Final   Escherichia coli DETECTED (A) NOT DETECTED Final    Comment: CRITICAL RESULT CALLED TO, READ BACK BY AND VERIFIED WITH: NATHAN BELUE @ 04/28/2024 2243 AB    Klebsiella aerogenes NOT DETECTED NOT DETECTED Final   Klebsiella oxytoca NOT DETECTED NOT DETECTED Final   Klebsiella pneumoniae NOT DETECTED NOT DETECTED Final   Proteus species NOT DETECTED NOT DETECTED Final   Salmonella species NOT DETECTED NOT DETECTED Final   Serratia marcescens NOT DETECTED NOT DETECTED Final   Haemophilus influenzae NOT DETECTED NOT DETECTED Final   Neisseria meningitidis NOT DETECTED NOT DETECTED Final   Pseudomonas aeruginosa NOT DETECTED NOT DETECTED Final   Stenotrophomonas maltophilia NOT DETECTED NOT DETECTED Final   Candida albicans NOT DETECTED NOT DETECTED Final   Candida auris NOT DETECTED NOT DETECTED Final   Candida glabrata NOT DETECTED NOT DETECTED Final   Candida krusei NOT DETECTED NOT DETECTED Final   Candida parapsilosis NOT DETECTED NOT DETECTED Final   Candida tropicalis NOT DETECTED NOT DETECTED Final   Cryptococcus neoformans/gattii NOT DETECTED NOT DETECTED Final   CTX-M ESBL NOT DETECTED NOT DETECTED Final   Carbapenem resistance IMP NOT DETECTED NOT DETECTED Final   Carbapenem resistance KPC NOT DETECTED NOT DETECTED Final   Carbapenem resistance NDM NOT DETECTED NOT DETECTED Final   Carbapenem resist OXA 48 LIKE NOT DETECTED NOT DETECTED Final   Carbapenem resistance VIM NOT DETECTED NOT DETECTED Final    Comment: Performed at [facility], [address] [place], [place] [address]  Urine Culture (for pregnant, neutropenic or urologic patients or patients with  an indwelling urinary catheter)     Status: Abnormal   Collection Time: 04/28/24 11:03 AM   Specimen: Urine, Clean Catch  Result Value Ref Range Status   Specimen Description   Final    URINE, CLEAN CATCH Performed at [facility], [address], [place], [place] [address]    Special Requests   Final    NONE Performed at [facility], [address] [place], [place] [address]    Culture 50,000 COLONIES/mL ESCHERICHIA COLI (A)  Final   Report Status 04/30/2024 FINAL  Final   Organism ID, Bacteria ESCHERICHIA COLI (A)  Final      Susceptibility   Escherichia coli - MIC*    AMPICILLIN >=32 RESISTANT Resistant     CEFAZOLIN (URINE) Value in next row Sensitive      8 SENSITIVEThis is a modified FDA-approved test that has been validated and its performance characteristics determined by the reporting laboratory.  This laboratory is certified under the Clinical Laboratory Improvement Amendments CLIA as qualified to perform high complexity clinical laboratory testing.    CEFEPIME  Value in next row Sensitive      8 SENSITIVEThis is a modified FDA-approved test that has been validated and its performance characteristics determined by the reporting laboratory.  This laboratory is certified under the Clinical Laboratory Improvement Amendments CLIA as qualified to perform high complexity clinical laboratory testing.    ERTAPENEM Value in next row Sensitive      8 SENSITIVEThis is a modified FDA-approved test that has been validated and its performance characteristics determined by the reporting laboratory.  This laboratory is certified under the Clinical Laboratory Improvement Amendments CLIA as qualified to perform high complexity clinical laboratory testing.    CEFTRIAXONE  Value in next row Sensitive      8 SENSITIVEThis is a modified FDA-approved test that has  been validated and its performance characteristics determined by the reporting laboratory.  This laboratory is certified under the Clinical Laboratory Improvement Amendments CLIA as qualified to perform high complexity clinical laboratory testing.    CIPROFLOXACIN  Value in next row Sensitive      8 SENSITIVEThis is a modified FDA-approved test that has been validated and its performance characteristics determined by the reporting laboratory.  This laboratory is certified under the Clinical Laboratory Improvement Amendments CLIA as qualified to perform high complexity clinical laboratory testing.    GENTAMICIN Value in next row Sensitive      8 SENSITIVEThis is a modified FDA-approved test that has been validated and its performance characteristics determined by the reporting laboratory.  This laboratory is certified under the Clinical Laboratory Improvement Amendments CLIA as qualified to perform high complexity clinical laboratory testing.    NITROFURANTOIN Value in next row Sensitive      8 SENSITIVEThis is a modified FDA-approved test that has been validated and its performance characteristics determined by the reporting laboratory.  This laboratory is certified under the Clinical Laboratory Improvement Amendments CLIA as qualified to perform high complexity clinical laboratory testing.    TRIMETH /SULFA  Value in next row Sensitive      8 SENSITIVEThis is a modified FDA-approved test that has been validated and its performance characteristics determined by the reporting laboratory.  This laboratory is certified under the Clinical Laboratory Improvement Amendments CLIA as qualified to perform high complexity clinical laboratory testing.    AMPICILLIN/SULBACTAM Value in next row Intermediate      8 SENSITIVEThis is a modified FDA-approved test that has been validated and its performance characteristics determined by  the reporting laboratory.  This laboratory is certified under the Clinical Laboratory  Improvement Amendments CLIA as qualified to perform high complexity clinical laboratory testing.    PIP/TAZO Value in next row Sensitive      <=4 SENSITIVEThis is a modified FDA-approved test that has been validated and its performance characteristics determined by the reporting laboratory.  This laboratory is certified under the Clinical Laboratory Improvement Amendments CLIA as qualified to perform high complexity clinical laboratory testing.    MEROPENEM Value in next row Sensitive      <=4 SENSITIVEThis is a modified FDA-approved test that has been validated and its performance characteristics determined by the reporting laboratory.  This laboratory is certified under the Clinical Laboratory Improvement Amendments CLIA as qualified to perform high complexity clinical laboratory testing.    * 50,000 COLONIES/mL ESCHERICHIA COLI  Blood Culture (routine x 2)     Status: Abnormal (Preliminary result)   Collection Time: 04/28/24  1:46 PM   Specimen: BLOOD LEFT WRIST  Result Value Ref Range Status   Specimen Description   Final    BLOOD LEFT WRIST Performed at [facility], [address], [place], [place] [address]    Special Requests   Final    BOTTLES DRAWN AEROBIC AND ANAEROBIC Blood Culture adequate volume Performed at [facility], [address] [place], [place] [address]    Culture  Setup Time (A)  Final    YEAST AEROBIC BOTTLE ONLY CRITICAL RESULT CALLED TO, READ BACK BY AND VERIFIED WITH: NATHAN BELUE @ 04/30/2024 0216 AB    Culture (A)  Final    CANDIDA ALBICANS Sent to [facility] for further susceptibility testing. Performed at [facility], [address] [place], [place] [address]    Report Status PENDING  Incomplete  Blood Culture ID Panel (Reflexed)     Status: Abnormal   Collection Time: 04/28/24  1:46 PM  Result Value Ref Range Status   Enterococcus faecalis NOT DETECTED NOT DETECTED Final   Enterococcus Faecium NOT DETECTED NOT DETECTED Final    Listeria monocytogenes NOT DETECTED NOT DETECTED Final   Staphylococcus species NOT DETECTED NOT DETECTED Final   Staphylococcus aureus (BCID) NOT DETECTED NOT DETECTED Final   Staphylococcus epidermidis NOT DETECTED NOT DETECTED Final   Staphylococcus lugdunensis NOT DETECTED NOT DETECTED Final   Streptococcus species NOT DETECTED NOT DETECTED Final   Streptococcus agalactiae NOT DETECTED NOT DETECTED Final   Streptococcus pneumoniae NOT DETECTED NOT DETECTED Final   Streptococcus pyogenes NOT DETECTED NOT DETECTED Final   A.calcoaceticus-baumannii NOT DETECTED NOT DETECTED Final   Bacteroides fragilis NOT DETECTED NOT DETECTED Final   Enterobacterales NOT DETECTED NOT DETECTED Final   Enterobacter cloacae complex NOT DETECTED NOT DETECTED Final   Escherichia coli NOT DETECTED NOT DETECTED Final   Klebsiella aerogenes NOT DETECTED NOT DETECTED Final   Klebsiella oxytoca NOT DETECTED NOT DETECTED Final   Klebsiella pneumoniae NOT DETECTED NOT DETECTED Final   Proteus species NOT DETECTED NOT DETECTED Final   Salmonella species NOT DETECTED NOT DETECTED Final   Serratia marcescens NOT DETECTED NOT DETECTED Final   Haemophilus influenzae NOT DETECTED NOT DETECTED Final   Neisseria meningitidis NOT DETECTED NOT DETECTED Final   Pseudomonas aeruginosa NOT DETECTED NOT DETECTED Final   Stenotrophomonas maltophilia NOT DETECTED NOT DETECTED Final   Candida albicans DETECTED (A) NOT DETECTED Final    Comment: CRITICAL RESULT CALLED TO, READ BACK BY AND VERIFIED WITH: NATHAN BELUE @ 04/30/2024 0216 AB  Candida auris NOT DETECTED NOT DETECTED Final   Candida glabrata DETECTED (A) NOT DETECTED Final    Comment: CRITICAL RESULT CALLED TO, READ BACK BY AND VERIFIED WITH: NATHAN BELUE @ 04/30/2024 0216 AB    Candida krusei NOT DETECTED NOT DETECTED Final   Candida parapsilosis NOT DETECTED NOT DETECTED Final   Candida tropicalis NOT DETECTED NOT DETECTED Final   Cryptococcus  neoformans/gattii NOT DETECTED NOT DETECTED Final    Comment: Performed at [facility], [address] [place], [place] [address]  Culture, blood (Routine X 2) w Reflex to ID Panel     Status: None (Preliminary result)   Collection Time: 05/01/24  3:16 PM   Specimen: BLOOD  Result Value Ref Range Status   Specimen Description BLOOD BLOOD LEFT ARM  Final   Special Requests   Final    BOTTLES DRAWN AEROBIC AND ANAEROBIC Blood Culture adequate volume   Culture   Final    NO GROWTH 2 DAYS Performed at [facility], [address] [place], [place] [address]    Report Status PENDING  Incomplete  Culture, blood (Routine X 2) w Reflex to ID Panel     Status: None (Preliminary result)   Collection Time: 05/01/24  3:17 PM   Specimen: BLOOD  Result Value Ref Range Status   Specimen Description BLOOD BLOOD LEFT HAND  Final   Special Requests AEROBIC BOTTLE ONLY Blood Culture adequate volume  Final   Culture   Final    NO GROWTH 2 DAYS Performed at [facility], [address], [place], [place] [address]    Report Status PENDING  Incomplete         Radiology Studies: ECHOCARDIOGRAM COMPLETE Result Date: 05/02/2024    ECHOCARDIOGRAM REPORT   Patient Name:   ESHANI MAESTRE Stetzer Date of Exam: 05/02/2024 Medical Rec #:  [id number]        Height:       68.0 in Accession #:    [id number]       Weight:       179.9 lb Date of Birth:  08-01-1940       BSA:          1.954 m Patient Age:    82 years         BP:           125/58 mmHg Patient Gender: F                HR:           88 bpm. Exam Location:  [facility] Procedure: 2D Echo, Cardiac Doppler, Color Doppler and Intracardiac            Opacification Agent (Both Spectral and Color Flow Doppler were            utilized during procedure). Indications:     Bacteremia  History:         Patient has prior history of Echocardiogram examinations, most                  recent 09/05/2019. CAD; Risk Factors:Diabetes, Dyslipidemia and                   Hypertension. CKD.  Sonographer:     Philomena Daring Referring Phys:  [id number] DAVID P FITZGERALD Diagnosing Phys: Lonni Hanson MD IMPRESSIONS  1. Left ventricular ejection fraction, by estimation, is 55 to 60%. The left ventricle has normal function. The left ventricle has no regional wall  motion abnormalities. There is mild left ventricular hypertrophy. Left ventricular diastolic parameters are consistent with Grade I diastolic dysfunction (impaired relaxation).  2. Right ventricular systolic function is normal. The right ventricular size is normal. There is normal pulmonary artery systolic pressure.  3. The mitral valve is degenerative. No evidence of mitral valve regurgitation. Mild mitral stenosis. Moderate mitral annular calcification.  4. The aortic valve has an indeterminant number of cusps. There is mild calcification of the aortic valve. There is mild thickening of the aortic valve. Aortic valve regurgitation is not visualized. Aortic valve sclerosis/calcification is present, without any evidence of aortic stenosis.  5. The inferior vena cava is normal in size with greater than 50% respiratory variability, suggesting right atrial pressure of 3 mmHg. Conclusion(s)/Recommendation(s): No evidence of valvular vegetations on this transthoracic echocardiogram. Consider a transesophageal echocardiogram to exclude infective endocarditis if clinically indicated. FINDINGS  Left Ventricle: Left ventricular ejection fraction, by estimation, is 55 to 60%. The left ventricle has normal function. The left ventricle has no regional wall motion abnormalities. Definity  contrast agent was given IV to delineate the left ventricular  endocardial borders. The left ventricular internal cavity size was normal in size. There is mild left ventricular hypertrophy. Left ventricular diastolic parameters are consistent with Grade I diastolic dysfunction (impaired relaxation). Right Ventricle: The right ventricular size is normal.  Right vetricular wall thickness was not well visualized. Right ventricular systolic function is normal. There is normal pulmonary artery systolic pressure. The tricuspid regurgitant velocity is 1.24 m/s, and with an assumed right atrial pressure of 3 mmHg, the estimated right ventricular systolic pressure is 9.2 mmHg. Left Atrium: Left atrial size was normal in size. Right Atrium: Right atrial size was normal in size. Pericardium: The pericardium was not well visualized. Mitral Valve: The mitral valve is degenerative in appearance. There is mild thickening of the mitral valve leaflet(s). Moderate mitral annular calcification. No evidence of mitral valve regurgitation. Mild mitral valve stenosis. The mean mitral valve gradient is 5.0 mmHg. Tricuspid Valve: The tricuspid valve is not well visualized. Tricuspid valve regurgitation is not demonstrated. Aortic Valve: The aortic valve has an indeterminant number of cusps. There is mild calcification of the aortic valve. There is mild thickening of the aortic valve. Aortic valve regurgitation is not visualized. Aortic valve sclerosis/calcification is present, without any evidence of aortic stenosis. Aortic valve mean gradient measures 6.0 mmHg. Aortic valve peak gradient measures 11.6 mmHg. Aortic valve area, by VTI measures 2.22 cm. Pulmonic Valve: The pulmonic valve was not well visualized. Pulmonic valve regurgitation is not visualized. No evidence of pulmonic stenosis. Aorta: The aortic root is normal in size and structure. Venous: The inferior vena cava is normal in size with greater than 50% respiratory variability, suggesting right atrial pressure of 3 mmHg. IAS/Shunts: The interatrial septum was not well visualized.  LEFT VENTRICLE PLAX 2D LVIDd:         4.50 cm   Diastology LVIDs:         3.00 cm   LV e' medial:    4.24 cm/s LV PW:         1.20 cm   LV E/e' medial:  21.6 LV IVS:        1.30 cm   LV e' lateral:   6.74 cm/s LVOT diam:     2.10 cm   LV E/e' lateral:  13.6 LV SV:         70 LV SV Index:   36 LVOT Area:  3.46 cm  RIGHT VENTRICLE             IVC RV S prime:     17.50 cm/s  IVC diam: 1.70 cm TAPSE (M-mode): 2.9 cm LEFT ATRIUM             Index        RIGHT ATRIUM           Index LA diam:        3.20 cm 1.64 cm/m   RA Area:     13.40 cm LA Vol (A2C):   53.6 ml 27.43 ml/m  RA Volume:   29.80 ml  15.25 ml/m LA Vol (A4C):   34.0 ml 17.40 ml/m LA Biplane Vol: 42.8 ml 21.91 ml/m  AORTIC VALVE AV Area (Vmax):    2.36 cm AV Area (Vmean):   2.26 cm AV Area (VTI):     2.22 cm AV Vmax:           170.00 cm/s AV Vmean:          117.000 cm/s AV VTI:            0.317 m AV Peak Grad:      11.6 mmHg AV Mean Grad:      6.0 mmHg LVOT Vmax:         116.00 cm/s LVOT Vmean:        76.500 cm/s LVOT VTI:          0.203 m LVOT/AV VTI ratio: 0.64  AORTA Ao Root diam: 3.00 cm MITRAL VALVE                TRICUSPID VALVE MV Area (PHT): 4.29 cm     TR Peak grad:   6.2 mmHg MV Mean grad:  5.0 mmHg     TR Vmax:        124.00 cm/s MV Decel Time: 177 msec MV E velocity: 91.40 cm/s   SHUNTS MV A velocity: 149.00 cm/s  Systemic VTI:  0.20 m MV E/A ratio:  0.61         Systemic Diam: 2.10 cm Lonni Hanson MD Electronically signed by Lonni Hanson MD Signature Date/Time: 05/02/2024/5:22:15 PM    Final         Scheduled Meds:  amLODipine   2.5 mg Oral Daily   aspirin  EC  81 mg Oral Daily   clopidogrel   75 mg Oral Daily   heparin   5,000 Units Subcutaneous Q8H   nortriptyline   150 mg Oral QHS   polyethylene glycol  17 g Oral Daily   sodium chloride  flush  3 mL Intravenous Q12H   Continuous Infusions:  cefTRIAXone  (ROCEPHIN )  IV Stopped (05/02/24 1642)   micafungin  (MYCAMINE ) 100 mg in sodium chloride  0.9 % 100 mL IVPB 100 mg (05/03/24 0814)     LOS: 5 days       Anthony CHRISTELLA Pouch, MD [facility] Pager [phone]  If 7PM-7AM, please contact night-coverage [email or web address] 05/03/2024, 10:42 AM

## 2024-05-04 DIAGNOSIS — A4150 Gram-negative sepsis, unspecified: Secondary | ICD-10-CM | POA: Diagnosis not present

## 2024-05-04 DIAGNOSIS — N390 Urinary tract infection, site not specified: Secondary | ICD-10-CM | POA: Diagnosis not present

## 2024-05-04 LAB — BASIC METABOLIC PANEL WITH GFR
Anion gap: 14 (ref 5–15)
BUN: 34 mg/dL — ABNORMAL HIGH (ref 8–23)
CO2: 18 mmol/L — ABNORMAL LOW (ref 22–32)
Calcium: 8.1 mg/dL — ABNORMAL LOW (ref 8.9–10.3)
Chloride: 101 mmol/L (ref 98–111)
Creatinine, Ser: 1.54 mg/dL — ABNORMAL HIGH (ref 0.44–1.00)
GFR, Estimated: 34 mL/min — ABNORMAL LOW (ref >60)
Glucose, Bld: 125 mg/dL — ABNORMAL HIGH (ref 70–99)
Potassium: 4 mmol/L (ref 3.5–5.1)
Sodium: 132 mmol/L — ABNORMAL LOW (ref 135–145)

## 2024-05-04 LAB — CBC
HCT: 32.5 % — ABNORMAL LOW (ref 36.0–46.0)
Hemoglobin: 10.9 g/dL — ABNORMAL LOW (ref 12.0–15.0)
MCH: 28.2 pg (ref 26.0–34.0)
MCHC: 33.5 g/dL (ref 30.0–36.0)
MCV: 84 fL (ref 80.0–100.0)
Platelets: 306 K/uL (ref 150–400)
RBC: 3.87 MIL/uL (ref 3.87–5.11)
RDW: 15.3 % (ref 11.5–15.5)
WBC: 15.8 K/uL — ABNORMAL HIGH (ref 4.0–10.5)
nRBC: 0 % (ref 0.0–0.2)

## 2024-05-04 MED ORDER — CIPROFLOXACIN HCL 500 MG PO TABS
500.0000 mg | ORAL_TABLET | ORAL | Status: AC
  Administered 2024-05-04: 15:00:00 500 mg via ORAL
  Filled 2024-05-04: qty 1

## 2024-05-04 MED ORDER — FLUCONAZOLE 100 MG PO TABS: 200.0000 mg | ORAL_TABLET | Freq: Every day | ORAL | 0 refills | Status: AC

## 2024-05-04 NOTE — Progress Notes (Signed)
 Occupational Therapy Treatment Patient Details Name: Cynthia Singh MRN: [id number] DOB: [birth year] Today's Date: 05/04/2024   History of present illness 83 y.o. female admitted with sepsis & acute metabolic encephalopathy due to UTI. Also found to have subacute vertebral fracture L1, T5, T12, no neurosurgical intervention, TLSO brace when OOB, ok to take off with shower. PMH significant for diabetes, HTN, GERD, CKD, MI, obesity, CAD, OA   OT comments  Patient seen for OT treatment on this date. Upon arrival to room patient resting in bed, agreeable to treatment. Patient had not been instructed on need to wear brace when OOB; OT set up LSO and adjusted to fit patient, noted chart stated TLSO, located one in room; attempted to set up for patient however TLSO missing extenders so brace not applied. Patient returned to supine with mod A to lift BLE and to adjust in bed.  Patient ended treatment in bed with bed/chair alarm on and all needs within reach. Patient making good progress toward goals, will continue to follow POC. Discharge recommendation remains appropriate.        If plan is discharge home, recommend the following:  Two people to help with walking and/or transfers;Two people to help with bathing/dressing/bathroom   Equipment Recommendations  Other (comment) (defer)    Recommendations for Other Services      Precautions / Restrictions Precautions Precautions: Fall;Back Recall of Precautions/Restrictions: Impaired Required Braces or Orthoses: Spinal Brace Spinal Brace: Thoracolumbosacral orthotic Spinal Brace Comments: when OOB Restrictions Weight Bearing Restrictions Per Provider Order: No       Mobility Bed Mobility Overal bed mobility: Needs Assistance Bed Mobility: Rolling, Sidelying to Sit, Sit to Supine Rolling: Min assist Sidelying to sit: Min assist   Sit to supine: Min assist   General bed mobility comments: patient with no recollection of log rolling in  bed. Cues needed for hand placement, optimization of movement    Transfers                   General transfer comment: attempted to apply TLSO however no extenders present so brace does not fit; transport agreeable to provide     Balance Overall balance assessment: Needs assistance Sitting-balance support: Feet supported Sitting balance-Leahy Scale: Good                                     ADL either performed or assessed with clinical judgement   ADL Overall ADL's : Needs assistance/impaired                                       General ADL Comments: max A for LB management in sitting    Extremity/Trunk Assessment Upper Extremity Assessment Upper Extremity Assessment: Generalized weakness            Vision       Perception     Praxis     Communication Communication Communication: Impaired Factors Affecting Communication: Difficulty expressing self   Cognition Arousal: Alert Behavior During Therapy: WFL for tasks assessed/performed Cognition: No family/caregiver present to determine baseline                               Following commands: Impaired Following commands impaired: Follows one step commands with increased time  Cueing   Cueing Techniques: Verbal cues, Gestural cues, Tactile cues  Exercises      Shoulder Instructions       General Comments      Pertinent Vitals/ Pain       Pain Assessment Pain Assessment: 0-10 Pain Score: 8  Pain Location: back/ legs Pain Descriptors / Indicators: Discomfort, Sore Pain Intervention(s): Monitored during session  Home Living                                          Prior Functioning/Environment              Frequency  Min 2X/week        Progress Toward Goals  OT Goals(current goals can now be found in the care plan section)  Progress towards OT goals: Progressing toward goals     Plan       Co-evaluation                 AM-PAC OT 6 Clicks Daily Activity     Outcome Measure   Help from another person eating meals?: A Little Help from another person taking care of personal grooming?: A Little Help from another person toileting, which includes using toliet, bedpan, or urinal?: A Lot Help from another person bathing (including washing, rinsing, drying)?: A Lot Help from another person to put on and taking off regular upper body clothing?: A Little Help from another person to put on and taking off regular lower body clothing?: A Lot 6 Click Score: 15    End of Session Equipment Utilized During Treatment: Back brace  OT Visit Diagnosis: Unsteadiness on feet (R26.81);Other abnormalities of gait and mobility (R26.89);Muscle weakness (generalized) (M62.81);History of falling (Z91.81)   Activity Tolerance Patient limited by pain   Patient Left in bed;with call bell/phone within reach;with nursing/sitter in room   Nurse Communication Mobility status        Time: 9047-8983 OT Time Calculation (min): 24 min  Charges: OT General Charges $OT Visit: 1 Visit OT Treatments $Self Care/Home Management : 8-22 mins  Rogers Clause, OT/L MSOT, 05/04/2024

## 2024-05-04 NOTE — TOC Transition Note (Signed)
 Transition of Care [facility]) - Discharge Note   Patient Details  Name: MAIGEN MOZINGO MRN: [id number] Date of Birth: 02-02-1941  [facility] CM/SW Contact:  Alfonso Rummer, LCSW Phone Number: 05/04/2024, 1:05 PM   Clinical Narrative:    Pt will transition to [facility] for short term rehab. Pt will transport via [facility]. Family notified via phone. No further toc needs identified         Patient Goals and CMS Choice            Discharge Placement                       Discharge Plan and Services Additional resources added to the After Visit Summary for                                       Social Drivers of Health (SDOH) Interventions SDOH Screenings   Food Insecurity: No Food Insecurity (04/29/2024)  Housing: Low Risk (04/29/2024)  Transportation Needs: No Transportation Needs (04/29/2024)  Utilities: Not At Risk (04/29/2024)  Depression (PHQ2-9): Medium Risk (02/16/2024)  Financial Resource Strain: Medium Risk (06/24/2021)  Social Connections: Socially Isolated (04/29/2024)  Tobacco Use: Low Risk (04/28/2024)     Readmission Risk Interventions    05/01/2024   11:10 AM 05/02/2023   11:52 AM  Readmission Risk Prevention Plan  Transportation Screening Complete Complete  PCP or Specialist Appt within 5-7 Days  Complete  PCP or Specialist Appt within 3-5 Days Complete   Home Care Screening  Complete  Medication Review (RN CM)  Complete  HRI or Home Care Consult Complete   Palliative Care Screening Not Applicable   Medication Review (RN Care Manager) Complete

## 2024-05-04 NOTE — Progress Notes (Signed)
Attempted to call report to [facility]. No answer.

## 2024-05-04 NOTE — TOC Progression Note (Signed)
 Transition of Care [facility]) - Progression Note    Patient Details  Name: Cynthia Singh MRN: [id number] Date of Birth: 01-06-1941  [facility] Care [facility]) CM/SW Contact  Shasta DELENA Daring, RN Phone Number: 05/04/2024, 9:46 AM  Clinical Narrative:     [name]:  [id number]. Expires on 12/19. MD notified.                     Expected Discharge Plan and Services                                               Social Drivers of Health (SDOH) Interventions SDOH Screenings   Food Insecurity: No Food Insecurity (04/29/2024)  Housing: Low Risk (04/29/2024)  Transportation Needs: No Transportation Needs (04/29/2024)  Utilities: Not At Risk (04/29/2024)  Depression (PHQ2-9): Medium Risk (02/16/2024)  Financial Resource Strain: Medium Risk (06/24/2021)  Social Connections: Socially Isolated (04/29/2024)  Tobacco Use: Low Risk (04/28/2024)    Readmission Risk Interventions    05/01/2024   11:10 AM 05/02/2023   11:52 AM  Readmission Risk Prevention Plan  Transportation Screening Complete Complete  PCP or Specialist Appt within 5-7 Days  Complete  PCP or Specialist Appt within 3-5 Days Complete   Home Care Screening  Complete  Medication Review (RN CM)  Complete  HRI or Home Care Consult Complete   Palliative Care Screening Not Applicable   Medication Review (RN Care Manager) Complete

## 2024-05-04 NOTE — Discharge Summary (Addendum)
 Physician Discharge Summary  Cynthia Singh FMW:[id number] DOB: 07-Jan-1941 DOA: 04/28/2024  PCP: Wendee Lynwood HERO, NP  Admit date: 04/28/2024 Discharge date: 05/04/2024  Admitted From: home Disposition:  SNF  Recommendations for Outpatient Follow-up:  Follow up with PCP in 1-2 weeks F/u w/ neuro surg, Dr. Deatrice, in 1-2 weeks   Home Health: no  Equipment/Devices:  Discharge Condition: stable CODE STATUS: full  Diet recommendation: Dysphagia I diet  Brief/Interim Summary: HPI was taken from Dr. Fernand: Cynthia Singh is a 83 y.o. year old female with medical history of hypertension, hyperlipidemia, type 2 diabetes, CKD 3A presenting to the ED with nausea, vomiting, diarrhea and generalized weakness.   Patient reports she has been having nausea and vomiting along with abdominal pain since yesterday.  Reports chills but no recorded fevers.  Pain is suprapubic.    When asked further questions, she is unable to answer them. She is oriented to person, but not to time and place. She states she lives with her two sons. I called contact on file which is her grandson. He states pt lives with her son and him. She uses a walker to walk. Has no cognitive dysfunction at baseline but has been confused since yesterday. He noted blood in her urine yesterday and also her complaint of nausea and vomiting prompting him to bring her to the ED. He believes she has not been taking her medications for the last few days.    On arrival to the ED patient was noted to be HDS stable.  Lab work and imaging obtained. CBC with leukocytosis at 30.3, mild anemia at 11.4 baseline around 12.  CMP with AKI, elevated liver function and anion gap metabolic acidosis along with mild hyponatremia.  Lactic acid checked and initially 4.6 with repeat at 2.8 after IVF.  UTI concerning for urinary tract infection with positive nitrites and leukocyte esterase.  EDP ordered GI panel and C. difficile.  Chest x-ray with atelectasis and CT  abdomen pelvis shows mild left renal pyelectasis and ureterectasis with gas in the renal collecting system with concern for VUR.  No renal calculi seen.  Given patient presentation, [facility] contacted for admission.  Discharge Diagnoses:  Principal Problem:   Sepsis due to gram-negative UTI (HCC) Active Problems:   CAD (coronary artery disease)   Hyperlipidemia associated with type 2 diabetes mellitus (HCC)   Type 2 diabetes mellitus (HCC)   GERD (gastroesophageal reflux disease)   Stage 3a chronic kidney disease (HCC)  Severe sepsis: secondary to UTI. Sepsis resolved   UTI: urine cx growing e.coli. Completed abx course as per ID    Bacteremia: secondary to UTI. Blood cxs growing e.coli. Completed abx course. Repeat blood cxs NGTD   Unlikely fungemia: blood cx grew candida albicans, candida glabrata. Continue on micafungin  while inpatient and d/c on po fluconazole  200mg  x 10 days w/ stop date of 05/09/24 as per ID. Repeat blood cxs NGTD.   Hematuria : likely secondary to UTI. Will continue to monitor    Lactic acidosis: resolved   Leukocytosis: trending down. Will continue to monitor    Possible urinary retention / vesicoureteral reflux: w/ leaking around foley which has since been d/c. Bladder scan prn    AKI on CKDIIIa: Cr is trending down. Avoid nephrotoxic meds    Hyponatremia: labile. Will continue to monitor    Subacute vertebral fracture L1, T12: likely secondary to fall at home. TLSO brace when out of bed. No acute intervention as per neuro surg. Will need  to f/u outpatient w/ neuro surg    Transaminitis: resolved. Etiology unclear   HTN: continue on home dose of amlodipine     DM2: fair control, HbA1c 7.1 in 02/2024. Restart home anti-DM meds at d/c    Likely peripheral neuropathy: continue on home dose of nortriptyline     HLD: holding statin   Hx of CAD: continue on home dose of aspririn, plavix , statin   Recent fall: PT/OT recs SNF.    Acute metabolic  encephalopathy: secondary to UTI/bacteremia and uremia. Mental status is back to baseline    Overweight: BMI 28.0. Would benefit from weight loss   Discharge Instructions  Discharge Instructions     Discharge instructions   Complete by: As directed    F/u w/ PCP in 1-2 weeks.   Increase activity slowly   Complete by: As directed       Allergies as of 05/04/2024   No Known Allergies      Medication List     TAKE these medications    amLODipine  2.5 MG tablet Commonly known as: NORVASC  Take 1 tablet (2.5 mg total) by mouth daily.   aspirin  EC 81 MG tablet Take 81 mg by mouth daily.   atorvastatin  40 MG tablet Commonly known as: LIPITOR  TAKE 1 TABLET BY MOUTH EVERY DAY   clopidogrel  75 MG tablet Commonly known as: PLAVIX  TAKE 1 TABLET BY MOUTH EVERY DAY   DULoxetine  30 MG capsule Commonly known as: CYMBALTA  TAKE 1 CAPSULE BY MOUTH EVERY DAY   empagliflozin  25 MG Tabs tablet Commonly known as: Jardiance  Take 1 tablet (25 mg total) by mouth daily before breakfast.   fluconazole  100 MG tablet Commonly known as: Diflucan  Take 2 tablets (200 mg total) by mouth daily for 5 days.   Lantus  SoloStar 100 UNIT/ML Solostar Pen Generic drug: insulin  glargine Inject 40 Units into the skin daily. TAKE 40 UNITS DAILY UNDER SKIN   meclizine  12.5 MG tablet Commonly known as: ANTIVERT  Take 1-2 tablets (12.5-25 mg total) by mouth 3 (three) times daily as needed for dizziness.   metFORMIN  500 MG 24 hr tablet Commonly known as: GLUCOPHAGE -XR TAKE 2 TABLETS (1,000 MG TOTAL) BY MOUTH IN THE MORNING AND AT BEDTIME.   nitroGLYCERIN  0.4 MG SL tablet Commonly known as: Nitrostat  Place 1 tablet (0.4 mg total) under the tongue every 5 (five) minutes as needed for chest pain.   nortriptyline  50 MG capsule Commonly known as: PAMELOR  Take 3 capsules (150 mg total) by mouth at bedtime.   oxyCODONE -acetaminophen  10-325 MG tablet Commonly known as: PERCOCET Take 1 tablet by mouth  every 6 (six) hours as needed for up to 1 day for pain. M54.9 What changed: how much to take   Pen Needles 32G X 5 MM Misc 1 Units by Does not apply route daily.   polyethylene glycol 17 g packet Commonly known as: MIRALAX  / GLYCOLAX  Take 17 g by mouth daily as needed.        Contact information for follow-up providers     Ulis Bottcher, PA-C Follow up on 05/29/2024.   Specialty: Physician Assistant Contact information: [address] [place], [address] [place] [address] [phone]         Deatrice Belvie PARAS, MD Follow up.   Specialty: Neurosurgery Why: F/u in 1-3 weeks Contact information: [address] [place] [address] [phone]              Contact information for after-discharge care     Destination  [facility] .   Service: Skilled Nursing Contact information: [address] [place]  [address] [phone]                    Allergies[1]  Consultations: Neuro surg    Procedures/Studies: ECHOCARDIOGRAM COMPLETE Result Date: 05/02/2024    ECHOCARDIOGRAM REPORT   Patient Name:   Cynthia Singh Date of Exam: 05/02/2024 Medical Rec #:  [id number]        Height:       68.0 in Accession #:    [id number]       Weight:       179.9 lb Date of Birth:  1941/04/10       BSA:          1.954 m Patient Age:    82 years         BP:           125/58 mmHg Patient Gender: F                HR:           88 bpm. Exam Location:  [facility] Procedure: 2D Echo, Cardiac Doppler, Color Doppler and Intracardiac            Opacification Agent (Both Spectral and Color Flow Doppler were            utilized during procedure). Indications:     Bacteremia  History:         Patient has prior history of Echocardiogram examinations, most                  recent 09/05/2019. CAD; Risk Factors:Diabetes, Dyslipidemia and                  Hypertension. CKD.  Sonographer:     Philomena Daring Referring Phys:  [id number] P  FITZGERALD Diagnosing Phys: Lonni Hanson MD IMPRESSIONS  1. Left ventricular ejection fraction, by estimation, is 55 to 60%. The left ventricle has normal function. The left ventricle has no regional wall motion abnormalities. There is mild left ventricular hypertrophy. Left ventricular diastolic parameters are consistent with Grade I diastolic dysfunction (impaired relaxation).  2. Right ventricular systolic function is normal. The right ventricular size is normal. There is normal pulmonary artery systolic pressure.  3. The mitral valve is degenerative. No evidence of mitral valve regurgitation. Mild mitral stenosis. Moderate mitral annular calcification.  4. The aortic valve has an indeterminant number of cusps. There is mild calcification of the aortic valve. There is mild thickening of the aortic valve. Aortic valve regurgitation is not visualized. Aortic valve sclerosis/calcification is present, without any evidence of aortic stenosis.  5. The inferior vena cava is normal in size with greater than 50% respiratory variability, suggesting right atrial pressure of 3 mmHg. Conclusion(s)/Recommendation(s): No evidence of valvular vegetations on this transthoracic echocardiogram. Consider a transesophageal echocardiogram to exclude infective endocarditis if clinically indicated. FINDINGS  Left Ventricle: Left ventricular ejection fraction, by estimation, is 55 to 60%. The left ventricle has normal function. The left ventricle has no regional wall motion abnormalities. Definity  contrast agent was given IV to delineate the left ventricular  endocardial borders. The left ventricular internal cavity size was normal in size. There is mild left ventricular hypertrophy. Left ventricular diastolic parameters are consistent with Grade I diastolic dysfunction (impaired relaxation). Right Ventricle: The right ventricular size is normal. Right vetricular wall thickness was not well visualized. Right ventricular systolic  function is normal. There is normal pulmonary artery systolic pressure. The tricuspid regurgitant velocity is 1.24 m/s, and with an assumed right atrial pressure of 3 mmHg, the estimated right ventricular systolic pressure is 9.2 mmHg. Left Atrium: Left atrial size was normal in size. Right Atrium: Right atrial size was normal in size. Pericardium: The pericardium was not well visualized. Mitral Valve: The mitral valve is degenerative in appearance. There is mild thickening of the mitral valve leaflet(s). Moderate mitral annular calcification. No evidence of mitral valve regurgitation. Mild mitral valve stenosis. The mean mitral valve gradient is 5.0 mmHg. Tricuspid Valve: The tricuspid valve is not well visualized. Tricuspid valve regurgitation is not demonstrated. Aortic Valve: The aortic valve has an indeterminant number of cusps. There is mild calcification of the aortic valve. There is mild thickening of the aortic valve. Aortic valve regurgitation is not visualized. Aortic valve sclerosis/calcification is present, without any evidence of aortic stenosis. Aortic valve mean gradient measures 6.0 mmHg. Aortic valve peak gradient measures 11.6 mmHg. Aortic valve area, by VTI measures 2.22 cm. Pulmonic Valve: The pulmonic valve was not well visualized. Pulmonic valve regurgitation is not visualized. No evidence of pulmonic stenosis. Aorta: The aortic root is normal in size and structure. Venous: The inferior vena cava is normal in size with greater than 50% respiratory variability, suggesting right atrial pressure of 3 mmHg. IAS/Shunts: The interatrial septum was not well visualized.  LEFT VENTRICLE PLAX 2D LVIDd:         4.50 cm   Diastology LVIDs:         3.00 cm   LV e' medial:    4.24 cm/s LV PW:         1.20 cm   LV E/e' medial:  21.6 LV IVS:        1.30 cm   LV e' lateral:   6.74 cm/s LVOT diam:     2.10 cm   LV E/e' lateral: 13.6 LV SV:         70 LV SV Index:   36 LVOT Area:     3.46 cm  RIGHT VENTRICLE              IVC RV S prime:     17.50 cm/s  IVC diam: 1.70 cm TAPSE (M-mode): 2.9 cm LEFT ATRIUM             Index        RIGHT ATRIUM           Index LA diam:        3.20 cm 1.64 cm/m   RA Area:     13.40 cm LA Vol (A2C):   53.6 ml 27.43 ml/m  RA Volume:   29.80 ml  15.25 ml/m LA Vol (A4C):   34.0 ml 17.40 ml/m LA Biplane Vol: 42.8 ml 21.91 ml/m  AORTIC VALVE AV Area (Vmax):    2.36 cm AV Area (Vmean):   2.26 cm AV Area (VTI):     2.22 cm AV Vmax:           170.00 cm/s AV Vmean:          117.000 cm/s AV VTI:            0.317 m AV Peak Grad:      11.6 mmHg AV Mean Grad:      6.0 mmHg LVOT Vmax:         116.00 cm/s LVOT Vmean:        76.500 cm/s  LVOT VTI:          0.203 m LVOT/AV VTI ratio: 0.64  AORTA Ao Root diam: 3.00 cm MITRAL VALVE                TRICUSPID VALVE MV Area (PHT): 4.29 cm     TR Peak grad:   6.2 mmHg MV Mean grad:  5.0 mmHg     TR Vmax:        124.00 cm/s MV Decel Time: 177 msec MV E velocity: 91.40 cm/s   SHUNTS MV A velocity: 149.00 cm/s  Systemic VTI:  0.20 m MV E/A ratio:  0.61         Systemic Diam: 2.10 cm Lonni Hanson MD Electronically signed by Lonni Hanson MD Signature Date/Time: 05/02/2024/5:22:15 PM    Final    US  Abdomen Limited RUQ (LIVER/GB) Result Date: 04/28/2024 EXAM: Right Upper Quadrant Abdominal Ultrasound 04/28/2024 02:34:52 PM TECHNIQUE: Real-time ultrasonography of the right upper quadrant of the abdomen was performed. COMPARISON: CT abdomen and pelvis 04/28/2024. CLINICAL HISTORY: Elevated LFTs. FINDINGS: LIVER: The liver demonstrates normal echogenicity. No intrahepatic biliary ductal dilatation. No evidence of mass. BILIARY SYSTEM: No pericholecystic fluid or wall thickening. No cholelithiasis. Common bile duct is within normal limits measuring 8 mm. OTHER: No right upper quadrant ascites. IMPRESSION: 1. No acute findings. 2. Common bile duct measures 8 mm which is within normal limits . Electronically signed by: Greig Pique MD 04/28/2024 10:02 PM EST RP  Workstation: [id number]   CT LUMBAR SPINE WO CONTRAST Result Date: 04/28/2024 EXAM: CT THORACIC AND LUMBAR SPINE WITHOUT INTRAVENOUS CONTRAST 04/28/2024 05:08:00 PM TECHNIQUE: CT of the thoracic and lumbar spine was performed without the administration of intravenous contrast. Multiplanar reformatted images are provided for review. Automated exposure control, iterative reconstruction, and/or weight based adjustment of the mA/kV was utilized to reduce the radiation dose to as low as reasonably achievable. Incidental adrenal and/or renal findings do not require follow up imaging. COMPARISON: Thoracic and lumbar spine radiographs 04/28/2024. CT abdomen and pelvis 04/28/2024. CTA chest 09/04/2019. CLINICAL HISTORY: fall FINDINGS: THORACIC SPINE: BONES AND ALIGNMENT: Exaggerated upper thoracic kyphosis. Mild upper thoracic levoscoliosis and mid to lower thoracic dextroscoliosis. No significant listhesis. Oblique nondisplaced fracture through the anterior aspect of the T5 vertebral body and superior endplate with involvement of anterior and right lateral osteophytes, new from 2021 but of indeterminate age as some margins appear more sclerotic/chronic while others are less well defined. No definite associated middle or posterior column fracture. Chronic mild T12 superior endplate compression fracture, unchanged from 2021. Ankylosis by bridging vertebral osteophytes throughout the thoracic spine. Multilevel facet ankylosis as well. DEGENERATIVE CHANGES: No high-grade spinal canal stenosis. SOFT TISSUES: No acute abnormality. Aortic and coronary atherosclerosis. LUMBAR SPINE: BONES AND ALIGNMENT: 5 lumbar type vertebrae. Trace anterolisthesis of L4 and L5. Horizontal fracture through the superior half of the L1 vertebral body with 7 mm of distraction and with a nondisplaced fracture extending through the T12 posterior elements bilaterally. Some of the fracture margins are mildly sclerotic, and this may be subacute.  Widespread posterior element ankylosis in the lumbar spine. DEGENERATIVE CHANGES: At least mild spinal stenosis and mild to moderate left neural foraminal stenosis at L4-L5 due to disc bulging, endplate spurring, and severe facet hypertrophy. SOFT TISSUES: Likely mild paravertebral soft tissue swelling at L1. IMPRESSION: 1. Horizontal fracture through the superior half of the L1 vertebral body and T12 posterior elements, possibly subacute. 2. Oblique nondisplaced fracture through the anterior aspect of the T5 vertebral  body, new from 2021 but of indeterminate age. 3. Widespread thoracic and lumbar ankylosis 4. Chronic T12 compression fracture. Electronically signed by: Dasie Hamburg MD 04/28/2024 06:37 PM EST RP Workstation: [id number]   CT THORACIC SPINE WO CONTRAST Result Date: 04/28/2024 EXAM: CT THORACIC AND LUMBAR SPINE WITHOUT INTRAVENOUS CONTRAST 04/28/2024 05:08:00 PM TECHNIQUE: CT of the thoracic and lumbar spine was performed without the administration of intravenous contrast. Multiplanar reformatted images are provided for review. Automated exposure control, iterative reconstruction, and/or weight based adjustment of the mA/kV was utilized to reduce the radiation dose to as low as reasonably achievable. Incidental adrenal and/or renal findings do not require follow up imaging. COMPARISON: Thoracic and lumbar spine radiographs 04/28/2024. CT abdomen and pelvis 04/28/2024. CTA chest 09/04/2019. CLINICAL HISTORY: fall FINDINGS: THORACIC SPINE: BONES AND ALIGNMENT: Exaggerated upper thoracic kyphosis. Mild upper thoracic levoscoliosis and mid to lower thoracic dextroscoliosis. No significant listhesis. Oblique nondisplaced fracture through the anterior aspect of the T5 vertebral body and superior endplate with involvement of anterior and right lateral osteophytes, new from 2021 but of indeterminate age as some margins appear more sclerotic/chronic while others are less well defined. No definite associated  middle or posterior column fracture. Chronic mild T12 superior endplate compression fracture, unchanged from 2021. Ankylosis by bridging vertebral osteophytes throughout the thoracic spine. Multilevel facet ankylosis as well. DEGENERATIVE CHANGES: No high-grade spinal canal stenosis. SOFT TISSUES: No acute abnormality. Aortic and coronary atherosclerosis. LUMBAR SPINE: BONES AND ALIGNMENT: 5 lumbar type vertebrae. Trace anterolisthesis of L4 and L5. Horizontal fracture through the superior half of the L1 vertebral body with 7 mm of distraction and with a nondisplaced fracture extending through the T12 posterior elements bilaterally. Some of the fracture margins are mildly sclerotic, and this may be subacute. Widespread posterior element ankylosis in the lumbar spine. DEGENERATIVE CHANGES: At least mild spinal stenosis and mild to moderate left neural foraminal stenosis at L4-L5 due to disc bulging, endplate spurring, and severe facet hypertrophy. SOFT TISSUES: Likely mild paravertebral soft tissue swelling at L1. IMPRESSION: 1. Horizontal fracture through the superior half of the L1 vertebral body and T12 posterior elements, possibly subacute. 2. Oblique nondisplaced fracture through the anterior aspect of the T5 vertebral body, new from 2021 but of indeterminate age. 3. Widespread thoracic and lumbar ankylosis 4. Chronic T12 compression fracture. Electronically signed by: Dasie Hamburg MD 04/28/2024 06:37 PM EST RP Workstation: [id number]   CT CERVICAL SPINE WO CONTRAST Result Date: 04/28/2024 EXAM: CT CERVICAL SPINE WITHOUT CONTRAST 04/28/2024 05:08:00 PM TECHNIQUE: CT of the cervical spine was performed without the administration of intravenous contrast. Multiplanar reformatted images are provided for review. Automated exposure control, iterative reconstruction, and/or weight based adjustment of the mA/kV was utilized to reduce the radiation dose to as low as reasonably achievable. COMPARISON: CT cervical spine  04/30/2023 CLINICAL HISTORY: Fall FINDINGS: CERVICAL SPINE: BONES AND ALIGNMENT: Chronic straightening of the normal cervical lordosis. Slight left convex cervical curvature near the cervicothoracic junction. No significant listhesis. No acute fracture or suspicious lesion. DEGENERATIVE CHANGES: Spondylosis with large anterior vertebral osteophytes throughout the majority of the cervical spine. Advanced multilevel facet arthrosis. Potentially moderate multilevel spinal stenosis. Moderate to severe bilateral neural foraminal stenosis at C3-C4. SOFT TISSUES: No prevertebral soft tissue swelling. IMPRESSION: 1. No acute cervical spine fracture or traumatic malalignment. 2. Diffuse cervical spondylosis and facet arthrosis. Electronically signed by: Dasie Hamburg MD 04/28/2024 06:13 PM EST RP Workstation: [id number]   DG Thoracic Spine 2 View Result Date: 04/28/2024 CLINICAL DATA:  Multiple fall EXAM:  THORACIC SPINE 2 VIEWS COMPARISON:  CT 04/28/2024, chest CT 09/04/2019 FINDINGS: Scoliosis of the thoracic spine. Moderate superior endplate deformity at T12, probably chronic to the chest CT from 2021 but lucencies on today's CT in the region of the spinous process. Lucency at the superior aspect of L1 vertebral body. Remaining vertebra demonstrate grossly normal stature. Multilevel flowing osteophytosis. IMPRESSION: 1. Moderate superior endplate deformity at T12, probably chronic to the chest CT from 2021 2. Suspected acute fracture involving L1 and posterior elements at T12. Dedicated thoracolumbar CT recommended for further assessment Electronically Signed   By: Luke Bun M.D.   On: 04/28/2024 16:14   DG Lumbar Spine 2-3 Views Result Date: 04/28/2024 CLINICAL DATA:  Multiple fall EXAM: LUMBAR SPINE - 2-3 VIEW COMPARISON:  CT 04/28/2024 FINDINGS: Lumbar alignment within normal limits. Aortic atherosclerosis. Abnormal lucency at the L1 vertebral body with superior endplate deformity. There is also additional  superior endplate deformity at T12. On sagittal reconstructions of today CT, possible linear lucency at the spinous process of T12 and left posterior arch of T12. Moderate disc space narrowing L3-L4 and L5-S1. Prominent lower lumbar facet degenerative changes. IMPRESSION: 1. Abnormal lucency at the L1 vertebral body with superior endplate deformity, suspicious for acute fracture. Additional superior endplate deformity at T12, age indeterminate. Possible linear lucency at the spinous process of T12 and left posterior arch of T12, suspicious for acute fracture. Dedicated thoracolumbar CT recommended for further assessment. 2. Moderate degenerative changes of the lower lumbar spine. Electronically Signed   By: Luke Bun M.D.   On: 04/28/2024 16:12   DG Cervical Spine 2 or 3 views Result Date: 04/28/2024 CLINICAL DATA:  Multiple fall EXAM: CERVICAL SPINE - 2-3 VIEW COMPARISON:  04/12/2011, CT 01/28/2023 FINDINGS: Straightening of the cervical spine. Normal prevertebral soft tissue thickness. Dens and lateral masses are grossly within normal limits allowing for suboptimal positioning. Moderate diffuse disc space narrowing C3 through C7 with bulky multilevel osteophytes. IMPRESSION: Straightening of the cervical spine with moderate diffuse degenerative changes. Cross-sectional imaging follow-up if persistent concern for cervical fracture Electronically Signed   By: Luke Bun M.D.   On: 04/28/2024 16:05   DG HIPS BILAT WITH PELVIS MIN 5 VIEWS Result Date: 04/28/2024 CLINICAL DATA:  Recent fall concern for fracture EXAM: DG HIP (WITH OR WITHOUT PELVIS) 5+V BILAT COMPARISON:  CT 04/28/2024 FINDINGS: SI joints are non widened. Pubic symphysis appears intact. No definitive fracture or malalignment. Prominent acetabular osteophytes. Mild bilateral hip degenerative change IMPRESSION: No acute osseous abnormality. Cross-sectional imaging follow-up if persistent concern for hip or pelvic fracture Electronically  Signed   By: Luke Bun M.D.   On: 04/28/2024 16:03   CT HEAD WO CONTRAST ( ) Result Date: 04/28/2024 EXAM: CT HEAD WITHOUT 04/28/2024 03:06:26 PM TECHNIQUE: CT of the head was performed without the administration of intravenous contrast. Automated exposure control, iterative reconstruction, and/or weight based adjustment of the mA/kV was utilized to reduce the radiation dose to as low as reasonably achievable. COMPARISON: head CT 04/30/2023 CLINICAL HISTORY: Mental status change, unknown cause FINDINGS: BRAIN AND VENTRICLES: There is no evidence of an acute infarct, intracranial hemorrhage, mass, midline shift, hydrocephalus, or extra-axial fluid collection. Cerebral volume is normal for age. Cerebral white matter hypodensities are nonspecific but compatible with mild chronic small vessel ischemic disease. Calcified atherosclerosis at the skull base. ORBITS: Bilateral cataract extraction. SINUSES AND MASTOIDS: No acute abnormality. SOFT TISSUES AND SKULL: No acute skull fracture. No acute soft tissue abnormality. IMPRESSION: 1. No acute intracranial  abnormality. 2. Mild chronic small vessel ischemic disease. Electronically signed by: Dasie Hamburg MD 04/28/2024 03:12 PM EST RP Workstation: [id number]   CT ABDOMEN PELVIS WO CONTRAST Result Date: 04/28/2024 CLINICAL DATA:  Diffuse abdominal pain.  Sepsis. EXAM: CT ABDOMEN AND PELVIS WITHOUT CONTRAST TECHNIQUE: Multidetector CT imaging of the abdomen and pelvis was performed following the standard protocol without IV contrast. RADIATION DOSE REDUCTION: This exam was performed according to the departmental dose-optimization program which includes automated exposure control, adjustment of the mA and/or kV according to patient size and/or use of iterative reconstruction technique. COMPARISON:  10/10/2010 FINDINGS: Lower chest: No acute findings. Hepatobiliary: No mass visualized on this unenhanced exam. Prior cholecystectomy. No evidence of biliary  obstruction. Pancreas: No mass or inflammatory process visualized on this unenhanced exam. Spleen:  Within normal limits in size. Adrenals/Urinary tract: No evidence of urolithiasis. Mild left renal pyelectasis and ureterectasis is seen with gas in the renal collecting systems, most likely due to vesicoureteral reflux. Small amount of gas also seen in urinary bladder, likely from recent instrumentation. Stomach/Bowel: No evidence of small bowel obstruction, inflammatory process, or abnormal fluid collections. Diffuse gaseous distention of colon is seen with moderate stool burden is, which may be due to ileus or constipation. Vascular/Lymphatic: No pathologically enlarged lymph nodes identified. No evidence of abdominal aortic aneurysm. Reproductive: Prior hysterectomy noted. Adnexal regions are unremarkable in appearance. Other:  None. Musculoskeletal:  No suspicious bone lesions identified. IMPRESSION: Mild left renal pyelectasis and ureterectasis with gas in the renal collecting system, most likely due to vesicoureteral reflux. No evidence of ureteral calculus. Gas in urinary bladder. Recommend clinical correlation for recent instrumentation. Diffuse gaseous distention of colon with moderate stool burden, which may be due to ileus or constipation. Electronically Signed   By: Norleen DELENA Kil M.D.   On: 04/28/2024 11:15   DG Chest Port 1 View Result Date: 04/28/2024 CLINICAL DATA:  Questionable sepsis.  Tachycardia. EXAM: PORTABLE CHEST 1 VIEW COMPARISON:  04/30/2023 FINDINGS: Rotated film. The cardio pericardial silhouette is enlarged. Atelectasis or infiltrate noted at the right base. Soft tissue fullness noted in the right hilar region with volume loss noted right hemithorax, likely accentuated by rotation. Bones are diffusely demineralized with degenerative changes in each shoulder. IMPRESSION: 1. Atelectasis or infiltrate at the right base. 2. Soft tissue fullness in the right hilar region with volume loss in  the right hemithorax, likely accentuated by rotation. Dedicated upright PA and lateral chest x-ray recommended when patient is able. Electronically Signed   By: Camellia Candle M.D.   On: 04/28/2024 08:55   (Echo, Carotid, EGD, Colonoscopy, ERCP)    Subjective: Pt c/o fatigue    Discharge Exam: Vitals:   05/04/24 0620 05/04/24 0824  BP: 139/68 132/69  Pulse: 88 85  Resp: 19 18  Temp: 97.9 F (36.6 C) 97.7 F (36.5 C)  SpO2: 96% 98%   Vitals:   05/03/24 1957 05/04/24 0500 05/04/24 0620 05/04/24 0824  BP: (!) 159/74  139/68 132/69  Pulse: 84  88 85  Resp: 19  19 18   Temp: 97.6 F (36.4 C)  97.9 F (36.6 C) 97.7 F (36.5 C)  TempSrc: Oral  Oral Oral  SpO2: 100%  96% 98%  Weight:  78.4 kg    Height:        General: Pt is alert, awake, not in acute distress Cardiovascular: S1/S2 +, no rubs, no gallops Respiratory: CTA bilaterally, no wheezing, no rhonchi Abdominal: Soft, NT, ND, bowel sounds + Extremities: no cyanosis  The results of significant diagnostics from this hospitalization (including imaging, microbiology, ancillary and laboratory) are listed below for reference.     Microbiology: Recent Results (from the past 240 hours)  Resp panel by RT-PCR (RSV, Flu A&B, Covid) Anterior Nasal Swab     Status: None   Collection Time: 04/28/24  8:30 AM   Specimen: Anterior Nasal Swab  Result Value Ref Range Status   SARS Coronavirus 2 by RT PCR NEGATIVE NEGATIVE Final    Comment: (NOTE) SARS-CoV-2 target nucleic acids are NOT DETECTED.  The SARS-CoV-2 RNA is generally detectable in upper respiratory specimens during the acute phase of infection. The lowest concentration of SARS-CoV-2 viral copies this assay can detect is 138 copies/mL. A negative result does not preclude SARS-Cov-2 infection and should not be used as the sole basis for treatment or other patient management decisions. A negative result may occur with  improper specimen collection/handling, submission  of specimen other than nasopharyngeal swab, presence of viral mutation(s) within the areas targeted by this assay, and inadequate number of viral copies(<138 copies/mL). A negative result must be combined with clinical observations, patient history, and epidemiological information. The expected result is Negative.  Fact Sheet for Patients:  bloggercourse.com  Fact Sheet for Healthcare Providers:  seriousbroker.it  This test is no t yet approved or cleared by the [place]  FDA and  has been authorized for detection and/or diagnosis of SARS-CoV-2 by FDA under an Emergency Use Authorization (EUA). This EUA will remain  in effect (meaning this test can be used) for the duration of the COVID-19 declaration under Section 564(b)(1) of the Act, 21 U.S.C.section 360bbb-3(b)(1), unless the authorization is terminated  or revoked sooner.       Influenza A by PCR NEGATIVE NEGATIVE Final   Influenza B by PCR NEGATIVE NEGATIVE Final    Comment: (NOTE) The Xpert Xpress SARS-CoV-2/FLU/RSV plus assay is intended as an aid in the diagnosis of influenza from Nasopharyngeal swab specimens and should not be used as a sole basis for treatment. Nasal washings and aspirates are unacceptable for Xpert Xpress SARS-CoV-2/FLU/RSV testing.  Fact Sheet for Patients: bloggercourse.com  Fact Sheet for Healthcare Providers: seriousbroker.it  This test is not yet approved or cleared by the [place]  FDA and has been authorized for detection and/or diagnosis of SARS-CoV-2 by FDA under an Emergency Use Authorization (EUA). This EUA will remain in effect (meaning this test can be used) for the duration of the COVID-19 declaration under Section 564(b)(1) of the Act, 21 U.S.C. section 360bbb-3(b)(1), unless the authorization is terminated or revoked.     Resp Syncytial Virus by PCR NEGATIVE NEGATIVE  Final    Comment: (NOTE) Fact Sheet for Patients: bloggercourse.com  Fact Sheet for Healthcare Providers: seriousbroker.it  This test is not yet approved or cleared by the [place]  FDA and has been authorized for detection and/or diagnosis of SARS-CoV-2 by FDA under an Emergency Use Authorization (EUA). This EUA will remain in effect (meaning this test can be used) for the duration of the COVID-19 declaration under Section 564(b)(1) of the Act, 21 U.S.C. section 360bbb-3(b)(1), unless the authorization is terminated or revoked.  Performed at [facility], [address] [place], [place] [address]   Blood Culture (routine x 2)     Status: Abnormal   Collection Time: 04/28/24  8:38 AM   Specimen: BLOOD  Result Value Ref Range Status   Specimen Description   Final    BLOOD BLOOD LEFT ARM Performed at [facility],  [address], [place], [place] [address]    Special Requests   Final    BOTTLES DRAWN AEROBIC AND ANAEROBIC Blood Culture adequate volume Performed at [facility], [address] [place], [place] [address]    Culture  Setup Time   Final    IN BOTH AEROBIC AND ANAEROBIC BOTTLES GRAM NEGATIVE RODS CRITICAL RESULT CALLED TO, READ BACK BY AND VERIFIED WITH: NATHAN BELUE @ 04/28/2024 2243 AB Performed at [facility], [address], [place], [place] [address]    Culture ESCHERICHIA COLI (A)  Final   Report Status 05/01/2024 FINAL  Final   Organism ID, Bacteria ESCHERICHIA COLI  Final      Susceptibility   Escherichia coli - MIC*    AMPICILLIN >=32 RESISTANT Resistant     CEFAZOLIN (NON-URINE) 8 RESISTANT Resistant     CEFEPIME  <=0.12 SENSITIVE Sensitive     ERTAPENEM <=0.12 SENSITIVE Sensitive     CEFTRIAXONE  <=0.25 SENSITIVE Sensitive     CIPROFLOXACIN  <=0.06 SENSITIVE Sensitive     GENTAMICIN <=1 SENSITIVE Sensitive     MEROPENEM <=0.25 SENSITIVE Sensitive      TRIMETH /SULFA  <=20 SENSITIVE Sensitive     AMPICILLIN/SULBACTAM >=32 RESISTANT Resistant     PIP/TAZO Value in next row Sensitive      <=4 SENSITIVEThis is a modified FDA-approved test that has been validated and its performance characteristics determined by the reporting laboratory.  This laboratory is certified under the Clinical Laboratory Improvement Amendments CLIA as qualified to perform high complexity clinical laboratory testing.    * ESCHERICHIA COLI  Blood Culture ID Panel (Reflexed)     Status: Abnormal   Collection Time: 04/28/24  8:38 AM  Result Value Ref Range Status   Enterococcus faecalis NOT DETECTED NOT DETECTED Final   Enterococcus Faecium NOT DETECTED NOT DETECTED Final   Listeria monocytogenes NOT DETECTED NOT DETECTED Final   Staphylococcus species NOT DETECTED NOT DETECTED Final   Staphylococcus aureus (BCID) NOT DETECTED NOT DETECTED Final   Staphylococcus epidermidis NOT DETECTED NOT DETECTED Final   Staphylococcus lugdunensis NOT DETECTED NOT DETECTED Final   Streptococcus species NOT DETECTED NOT DETECTED Final   Streptococcus agalactiae NOT DETECTED NOT DETECTED Final   Streptococcus pneumoniae NOT DETECTED NOT DETECTED Final   Streptococcus pyogenes NOT DETECTED NOT DETECTED Final   A.calcoaceticus-baumannii NOT DETECTED NOT DETECTED Final   Bacteroides fragilis NOT DETECTED NOT DETECTED Final   Enterobacterales DETECTED (A) NOT DETECTED Final    Comment: Enterobacterales represent a large order of gram negative bacteria, not a single organism. CRITICAL RESULT CALLED TO, READ BACK BY AND VERIFIED WITH: NATHAN BELUE @ 04/28/2024 2243 AB    Enterobacter cloacae complex NOT DETECTED NOT DETECTED Final   Escherichia coli DETECTED (A) NOT DETECTED Final    Comment: CRITICAL RESULT CALLED TO, READ BACK BY AND VERIFIED WITH: NATHAN BELUE @ 04/28/2024 2243 AB    Klebsiella aerogenes NOT DETECTED NOT DETECTED Final   Klebsiella oxytoca NOT DETECTED NOT DETECTED  Final   Klebsiella pneumoniae NOT DETECTED NOT DETECTED Final   Proteus species NOT DETECTED NOT DETECTED Final   Salmonella species NOT DETECTED NOT DETECTED Final   Serratia marcescens NOT DETECTED NOT DETECTED Final   Haemophilus influenzae NOT DETECTED NOT DETECTED Final   Neisseria meningitidis NOT DETECTED NOT DETECTED Final   Pseudomonas aeruginosa NOT DETECTED NOT DETECTED Final   Stenotrophomonas maltophilia NOT DETECTED NOT DETECTED Final   Candida albicans NOT DETECTED NOT DETECTED Final   Candida auris  NOT DETECTED NOT DETECTED Final   Candida glabrata NOT DETECTED NOT DETECTED Final   Candida krusei NOT DETECTED NOT DETECTED Final   Candida parapsilosis NOT DETECTED NOT DETECTED Final   Candida tropicalis NOT DETECTED NOT DETECTED Final   Cryptococcus neoformans/gattii NOT DETECTED NOT DETECTED Final   CTX-M ESBL NOT DETECTED NOT DETECTED Final   Carbapenem resistance IMP NOT DETECTED NOT DETECTED Final   Carbapenem resistance KPC NOT DETECTED NOT DETECTED Final   Carbapenem resistance NDM NOT DETECTED NOT DETECTED Final   Carbapenem resist OXA 48 LIKE NOT DETECTED NOT DETECTED Final   Carbapenem resistance VIM NOT DETECTED NOT DETECTED Final    Comment: Performed at [facility], [address], [place], [place] [address]  Urine Culture (for pregnant, neutropenic or urologic patients or patients with an indwelling urinary catheter)     Status: Abnormal   Collection Time: 04/28/24 11:03 AM   Specimen: Urine, Clean Catch  Result Value Ref Range Status   Specimen Description   Final    URINE, CLEAN CATCH Performed at [facility], [address], [place], [place] [address]    Special Requests   Final    NONE Performed at [facility], [address] [place], [place] [address]    Culture 50,000 COLONIES/mL ESCHERICHIA COLI (A)  Final   Report Status 04/30/2024 FINAL  Final   Organism ID, Bacteria ESCHERICHIA COLI (A)  Final       Susceptibility   Escherichia coli - MIC*    AMPICILLIN >=32 RESISTANT Resistant     CEFAZOLIN (URINE) Value in next row Sensitive      8 SENSITIVEThis is a modified FDA-approved test that has been validated and its performance characteristics determined by the reporting laboratory.  This laboratory is certified under the Clinical Laboratory Improvement Amendments CLIA as qualified to perform high complexity clinical laboratory testing.    CEFEPIME  Value in next row Sensitive      8 SENSITIVEThis is a modified FDA-approved test that has been validated and its performance characteristics determined by the reporting laboratory.  This laboratory is certified under the Clinical Laboratory Improvement Amendments CLIA as qualified to perform high complexity clinical laboratory testing.    ERTAPENEM Value in next row Sensitive      8 SENSITIVEThis is a modified FDA-approved test that has been validated and its performance characteristics determined by the reporting laboratory.  This laboratory is certified under the Clinical Laboratory Improvement Amendments CLIA as qualified to perform high complexity clinical laboratory testing.    CEFTRIAXONE  Value in next row Sensitive      8 SENSITIVEThis is a modified FDA-approved test that has been validated and its performance characteristics determined by the reporting laboratory.  This laboratory is certified under the Clinical Laboratory Improvement Amendments CLIA as qualified to perform high complexity clinical laboratory testing.    CIPROFLOXACIN  Value in next row Sensitive      8 SENSITIVEThis is a modified FDA-approved test that has been validated and its performance characteristics determined by the reporting laboratory.  This laboratory is certified under the Clinical Laboratory Improvement Amendments CLIA as qualified to perform high complexity clinical laboratory testing.    GENTAMICIN Value in next row Sensitive      8 SENSITIVEThis is a modified  FDA-approved test that has been validated and its performance characteristics determined by the reporting laboratory.  This laboratory is certified under the Clinical Laboratory Improvement Amendments CLIA as qualified to perform high complexity clinical laboratory testing.  NITROFURANTOIN Value in next row Sensitive      8 SENSITIVEThis is a modified FDA-approved test that has been validated and its performance characteristics determined by the reporting laboratory.  This laboratory is certified under the Clinical Laboratory Improvement Amendments CLIA as qualified to perform high complexity clinical laboratory testing.    TRIMETH /SULFA  Value in next row Sensitive      8 SENSITIVEThis is a modified FDA-approved test that has been validated and its performance characteristics determined by the reporting laboratory.  This laboratory is certified under the Clinical Laboratory Improvement Amendments CLIA as qualified to perform high complexity clinical laboratory testing.    AMPICILLIN/SULBACTAM Value in next row Intermediate      8 SENSITIVEThis is a modified FDA-approved test that has been validated and its performance characteristics determined by the reporting laboratory.  This laboratory is certified under the Clinical Laboratory Improvement Amendments CLIA as qualified to perform high complexity clinical laboratory testing.    PIP/TAZO Value in next row Sensitive      <=4 SENSITIVEThis is a modified FDA-approved test that has been validated and its performance characteristics determined by the reporting laboratory.  This laboratory is certified under the Clinical Laboratory Improvement Amendments CLIA as qualified to perform high complexity clinical laboratory testing.    MEROPENEM Value in next row Sensitive      <=4 SENSITIVEThis is a modified FDA-approved test that has been validated and its performance characteristics determined by the reporting laboratory.  This laboratory is certified under the  Clinical Laboratory Improvement Amendments CLIA as qualified to perform high complexity clinical laboratory testing.    * 50,000 COLONIES/mL ESCHERICHIA COLI  Blood Culture (routine x 2)     Status: Abnormal (Preliminary result)   Collection Time: 04/28/24  1:46 PM   Specimen: BLOOD LEFT WRIST  Result Value Ref Range Status   Specimen Description   Final    BLOOD LEFT WRIST Performed at [facility], [address] [place], [place] [address]    Special Requests   Final    BOTTLES DRAWN AEROBIC AND ANAEROBIC Blood Culture adequate volume Performed at [facility], [address] [place], [place] [address]    Culture  Setup Time (A)  Final    YEAST AEROBIC BOTTLE ONLY CRITICAL RESULT CALLED TO, READ BACK BY AND VERIFIED WITH: NATHAN BELUE @ 04/30/2024 0216 AB    Culture (A)  Final    CANDIDA ALBICANS Sent to [facility] for further susceptibility testing. Performed at [facility], [address] [place], [place] [address]    Report Status PENDING  Incomplete  Blood Culture ID Panel (Reflexed)     Status: Abnormal   Collection Time: 04/28/24  1:46 PM  Result Value Ref Range Status   Enterococcus faecalis NOT DETECTED NOT DETECTED Final   Enterococcus Faecium NOT DETECTED NOT DETECTED Final   Listeria monocytogenes NOT DETECTED NOT DETECTED Final   Staphylococcus species NOT DETECTED NOT DETECTED Final   Staphylococcus aureus (BCID) NOT DETECTED NOT DETECTED Final   Staphylococcus epidermidis NOT DETECTED NOT DETECTED Final   Staphylococcus lugdunensis NOT DETECTED NOT DETECTED Final   Streptococcus species NOT DETECTED NOT DETECTED Final   Streptococcus agalactiae NOT DETECTED NOT DETECTED Final   Streptococcus pneumoniae NOT DETECTED NOT DETECTED Final   Streptococcus pyogenes NOT DETECTED NOT DETECTED Final   A.calcoaceticus-baumannii NOT DETECTED NOT DETECTED Final   Bacteroides fragilis NOT DETECTED NOT DETECTED Final   Enterobacterales NOT DETECTED  NOT DETECTED Final  Enterobacter cloacae complex NOT DETECTED NOT DETECTED Final   Escherichia coli NOT DETECTED NOT DETECTED Final   Klebsiella aerogenes NOT DETECTED NOT DETECTED Final   Klebsiella oxytoca NOT DETECTED NOT DETECTED Final   Klebsiella pneumoniae NOT DETECTED NOT DETECTED Final   Proteus species NOT DETECTED NOT DETECTED Final   Salmonella species NOT DETECTED NOT DETECTED Final   Serratia marcescens NOT DETECTED NOT DETECTED Final   Haemophilus influenzae NOT DETECTED NOT DETECTED Final   Neisseria meningitidis NOT DETECTED NOT DETECTED Final   Pseudomonas aeruginosa NOT DETECTED NOT DETECTED Final   Stenotrophomonas maltophilia NOT DETECTED NOT DETECTED Final   Candida albicans DETECTED (A) NOT DETECTED Final    Comment: CRITICAL RESULT CALLED TO, READ BACK BY AND VERIFIED WITH: NATHAN BELUE @ 04/30/2024 0216 AB    Candida auris NOT DETECTED NOT DETECTED Final   Candida glabrata DETECTED (A) NOT DETECTED Final    Comment: CRITICAL RESULT CALLED TO, READ BACK BY AND VERIFIED WITH: NATHAN BELUE @ 04/30/2024 0216 AB    Candida krusei NOT DETECTED NOT DETECTED Final   Candida parapsilosis NOT DETECTED NOT DETECTED Final   Candida tropicalis NOT DETECTED NOT DETECTED Final   Cryptococcus neoformans/gattii NOT DETECTED NOT DETECTED Final    Comment: Performed at [facility], [address] [place], [place] [address]  Culture, blood (Routine X 2) w Reflex to ID Panel     Status: None (Preliminary result)   Collection Time: 05/01/24  3:16 PM   Specimen: BLOOD  Result Value Ref Range Status   Specimen Description BLOOD BLOOD LEFT ARM  Final   Special Requests   Final    BOTTLES DRAWN AEROBIC AND ANAEROBIC Blood Culture adequate volume   Culture   Final    NO GROWTH 3 DAYS Performed at [facility], [address] [place], [place] [address]    Report Status PENDING  Incomplete  Culture, blood (Routine X 2) w Reflex to ID Panel     Status: None  (Preliminary result)   Collection Time: 05/01/24  3:17 PM   Specimen: BLOOD  Result Value Ref Range Status   Specimen Description BLOOD BLOOD LEFT HAND  Final   Special Requests AEROBIC BOTTLE ONLY Blood Culture adequate volume  Final   Culture   Final    NO GROWTH 3 DAYS Performed at [facility], [address] [place], [place] [address]    Report Status PENDING  Incomplete     Labs: BNP (last 3 results) No results for input(s): BNP in the last 8760 hours. Basic Metabolic Panel: Recent Labs  Lab 04/28/24 0838 04/29/24 0345 04/30/24 0345 05/01/24 0420 05/02/24 0357 05/02/24 1431 05/02/24 1951 05/02/24 2353 05/03/24 0517 05/04/24 0452  NA 134*   < > 140 134* 129* 128* 126* 128* 130* 132*  K 4.7   < > 5.0 3.6 3.9 3.9  --   --   --  4.0  CL 99   < > 109 103 98 96*  --   --   --  101  CO2 15*   < > 18* 17* 17* 20*  --   --   --  18*  GLUCOSE 179*   < > 87 93 114* 151*  --   --   --  125*  BUN 55*   < > 62* 56* 50* 46*  --   --   --  34*  CREATININE 3.27*   < > 2.38* 1.96* 1.77* 1.66*  --   --   --  1.54*  CALCIUM  7.7*   < > 8.6* 8.3* 7.9* 8.2*  --   --   --  8.1*  MG 1.5*  --   --   --   --   --   --   --   --   --    < > = values in this interval not displayed.   Liver Function Tests: Recent Labs  Lab 04/28/24 0838 04/30/24 0345 05/02/24 1431  AST 45* 20 20  ALT 71* 24 13  ALKPHOS 165* 165* 148*  BILITOT 0.7 0.3 0.3  PROT 6.3* 6.3* 5.8*  ALBUMIN 3.0* 2.6* 2.5*   Recent Labs  Lab 04/28/24 9161  LIPASE <10*   No results for input(s): AMMONIA in the last 168 hours. CBC: Recent Labs  Lab 04/28/24 0838 04/29/24 0345 04/30/24 0345 05/01/24 0420 05/02/24 0357 05/04/24 0452  WBC 30.3* 19.1* 18.5* 12.0* 11.6* 15.8*  NEUTROABS 25.4*  --   --   --   --   --   HGB 11.4* 10.9* 11.2* 10.9* 10.6* 10.9*  HCT 35.6* 32.8* 35.2* 33.0* 32.2* 32.5*  MCV 89.2 87.7 90.7 86.6 84.7 84.0  PLT 181 146* 132* 125* 157 306   Cardiac Enzymes: No results for  input(s): CKTOTAL, CKMB, CKMBINDEX, TROPONINI in the last 168 hours. BNP: Invalid input(s): POCBNP CBG: Recent Labs  Lab 05/01/24 0756 05/01/24 1157 05/01/24 1702 05/03/24 0849 05/03/24 1214  GLUCAP 93 142* 106* 123* 159*   D-Dimer No results for input(s): DDIMER in the last 72 hours. Hgb A1c No results for input(s): HGBA1C in the last 72 hours. Lipid Profile No results for input(s): CHOL, HDL, LDLCALC, TRIG, CHOLHDL, LDLDIRECT in the last 72 hours. Thyroid function studies No results for input(s): TSH, T4TOTAL, T3FREE, THYROIDAB in the last 72 hours.  Invalid input(s): FREET3 Anemia work up No results for input(s): VITAMINB12, FOLATE, FERRITIN, TIBC, IRON , RETICCTPCT in the last 72 hours. Urinalysis    Component Value Date/Time   COLORURINE AMBER (A) 04/28/2024 1103   APPEARANCEUR TURBID (A) 04/28/2024 1103   LABSPEC 1.013 04/28/2024 1103   PHURINE 6.0 04/28/2024 1103   GLUCOSEU >=500 (A) 04/28/2024 1103   HGBUR LARGE (A) 04/28/2024 1103   BILIRUBINUR NEGATIVE 04/28/2024 1103   BILIRUBINUR neg 06/14/2019 1408   KETONESUR NEGATIVE 04/28/2024 1103   PROTEINUR 100 (A) 04/28/2024 1103   UROBILINOGEN 0.2 06/14/2019 1408   UROBILINOGEN 1.0 10/19/2012 1030   NITRITE POSITIVE (A) 04/28/2024 1103   LEUKOCYTESUR MODERATE (A) 04/28/2024 1103   Sepsis Labs Recent Labs  Lab 04/30/24 0345 05/01/24 0420 05/02/24 0357 05/04/24 0452  WBC 18.5* 12.0* 11.6* 15.8*   Microbiology Recent Results (from the past 240 hours)  Resp panel by RT-PCR (RSV, Flu A&B, Covid) Anterior Nasal Swab     Status: None   Collection Time: 04/28/24  8:30 AM   Specimen: Anterior Nasal Swab  Result Value Ref Range Status   SARS Coronavirus 2 by RT PCR NEGATIVE NEGATIVE Final    Comment: (NOTE) SARS-CoV-2 target nucleic acids are NOT DETECTED.  The SARS-CoV-2 RNA is generally detectable in upper respiratory specimens during the acute phase of  infection. The lowest concentration of SARS-CoV-2 viral copies this assay can detect is 138 copies/mL. A negative result does not preclude SARS-Cov-2 infection and should not be used as the sole basis for treatment or other patient management decisions. A negative result may occur with  improper specimen collection/handling, submission of specimen other than nasopharyngeal swab, presence of viral mutation(s) within the  areas targeted by this assay, and inadequate number of viral copies(<138 copies/mL). A negative result must be combined with clinical observations, patient history, and epidemiological information. The expected result is Negative.  Fact Sheet for Patients:  bloggercourse.com  Fact Sheet for Healthcare Providers:  seriousbroker.it  This test is no t yet approved or cleared by the [place]  FDA and  has been authorized for detection and/or diagnosis of SARS-CoV-2 by FDA under an Emergency Use Authorization (EUA). This EUA will remain  in effect (meaning this test can be used) for the duration of the COVID-19 declaration under Section 564(b)(1) of the Act, 21 U.S.C.section 360bbb-3(b)(1), unless the authorization is terminated  or revoked sooner.       Influenza A by PCR NEGATIVE NEGATIVE Final   Influenza B by PCR NEGATIVE NEGATIVE Final    Comment: (NOTE) The Xpert Xpress SARS-CoV-2/FLU/RSV plus assay is intended as an aid in the diagnosis of influenza from Nasopharyngeal swab specimens and should not be used as a sole basis for treatment. Nasal washings and aspirates are unacceptable for Xpert Xpress SARS-CoV-2/FLU/RSV testing.  Fact Sheet for Patients: bloggercourse.com  Fact Sheet for Healthcare Providers: seriousbroker.it  This test is not yet approved or cleared by the [place]  FDA and has been authorized for detection and/or diagnosis of SARS-CoV-2  by FDA under an Emergency Use Authorization (EUA). This EUA will remain in effect (meaning this test can be used) for the duration of the COVID-19 declaration under Section 564(b)(1) of the Act, 21 U.S.C. section 360bbb-3(b)(1), unless the authorization is terminated or revoked.     Resp Syncytial Virus by PCR NEGATIVE NEGATIVE Final    Comment: (NOTE) Fact Sheet for Patients: bloggercourse.com  Fact Sheet for Healthcare Providers: seriousbroker.it  This test is not yet approved or cleared by the [place]  FDA and has been authorized for detection and/or diagnosis of SARS-CoV-2 by FDA under an Emergency Use Authorization (EUA). This EUA will remain in effect (meaning this test can be used) for the duration of the COVID-19 declaration under Section 564(b)(1) of the Act, 21 U.S.C. section 360bbb-3(b)(1), unless the authorization is terminated or revoked.  Performed at [facility], [address] [place], [place] [address]   Blood Culture (routine x 2)     Status: Abnormal   Collection Time: 04/28/24  8:38 AM   Specimen: BLOOD  Result Value Ref Range Status   Specimen Description   Final    BLOOD BLOOD LEFT ARM Performed at [facility], [address] [place], [place] [address]    Special Requests   Final    BOTTLES DRAWN AEROBIC AND ANAEROBIC Blood Culture adequate volume Performed at [facility], [address] [place], [place] [address]    Culture  Setup Time   Final    IN BOTH AEROBIC AND ANAEROBIC BOTTLES GRAM NEGATIVE RODS CRITICAL RESULT CALLED TO, READ BACK BY AND VERIFIED WITH: NATHAN BELUE @ 04/28/2024 2243 AB Performed at [facility], [address], [place], [place] [address]    Culture ESCHERICHIA COLI (A)  Final   Report Status 05/01/2024 FINAL  Final   Organism ID, Bacteria ESCHERICHIA COLI  Final      Susceptibility   Escherichia coli - MIC*    AMPICILLIN  >=32 RESISTANT Resistant     CEFAZOLIN (NON-URINE) 8 RESISTANT Resistant     CEFEPIME  <=0.12 SENSITIVE Sensitive     ERTAPENEM <=0.12 SENSITIVE Sensitive     CEFTRIAXONE  <=0.25 SENSITIVE Sensitive  CIPROFLOXACIN  <=0.06 SENSITIVE Sensitive     GENTAMICIN <=1 SENSITIVE Sensitive     MEROPENEM <=0.25 SENSITIVE Sensitive     TRIMETH /SULFA  <=20 SENSITIVE Sensitive     AMPICILLIN/SULBACTAM >=32 RESISTANT Resistant     PIP/TAZO Value in next row Sensitive      <=4 SENSITIVEThis is a modified FDA-approved test that has been validated and its performance characteristics determined by the reporting laboratory.  This laboratory is certified under the Clinical Laboratory Improvement Amendments CLIA as qualified to perform high complexity clinical laboratory testing.    * ESCHERICHIA COLI  Blood Culture ID Panel (Reflexed)     Status: Abnormal   Collection Time: 04/28/24  8:38 AM  Result Value Ref Range Status   Enterococcus faecalis NOT DETECTED NOT DETECTED Final   Enterococcus Faecium NOT DETECTED NOT DETECTED Final   Listeria monocytogenes NOT DETECTED NOT DETECTED Final   Staphylococcus species NOT DETECTED NOT DETECTED Final   Staphylococcus aureus (BCID) NOT DETECTED NOT DETECTED Final   Staphylococcus epidermidis NOT DETECTED NOT DETECTED Final   Staphylococcus lugdunensis NOT DETECTED NOT DETECTED Final   Streptococcus species NOT DETECTED NOT DETECTED Final   Streptococcus agalactiae NOT DETECTED NOT DETECTED Final   Streptococcus pneumoniae NOT DETECTED NOT DETECTED Final   Streptococcus pyogenes NOT DETECTED NOT DETECTED Final   A.calcoaceticus-baumannii NOT DETECTED NOT DETECTED Final   Bacteroides fragilis NOT DETECTED NOT DETECTED Final   Enterobacterales DETECTED (A) NOT DETECTED Final    Comment: Enterobacterales represent a large order of gram negative bacteria, not a single organism. CRITICAL RESULT CALLED TO, READ BACK BY AND VERIFIED WITH: NATHAN BELUE @ 04/28/2024 2243  AB    Enterobacter cloacae complex NOT DETECTED NOT DETECTED Final   Escherichia coli DETECTED (A) NOT DETECTED Final    Comment: CRITICAL RESULT CALLED TO, READ BACK BY AND VERIFIED WITH: NATHAN BELUE @ 04/28/2024 2243 AB    Klebsiella aerogenes NOT DETECTED NOT DETECTED Final   Klebsiella oxytoca NOT DETECTED NOT DETECTED Final   Klebsiella pneumoniae NOT DETECTED NOT DETECTED Final   Proteus species NOT DETECTED NOT DETECTED Final   Salmonella species NOT DETECTED NOT DETECTED Final   Serratia marcescens NOT DETECTED NOT DETECTED Final   Haemophilus influenzae NOT DETECTED NOT DETECTED Final   Neisseria meningitidis NOT DETECTED NOT DETECTED Final   Pseudomonas aeruginosa NOT DETECTED NOT DETECTED Final   Stenotrophomonas maltophilia NOT DETECTED NOT DETECTED Final   Candida albicans NOT DETECTED NOT DETECTED Final   Candida auris NOT DETECTED NOT DETECTED Final   Candida glabrata NOT DETECTED NOT DETECTED Final   Candida krusei NOT DETECTED NOT DETECTED Final   Candida parapsilosis NOT DETECTED NOT DETECTED Final   Candida tropicalis NOT DETECTED NOT DETECTED Final   Cryptococcus neoformans/gattii NOT DETECTED NOT DETECTED Final   CTX-M ESBL NOT DETECTED NOT DETECTED Final   Carbapenem resistance IMP NOT DETECTED NOT DETECTED Final   Carbapenem resistance KPC NOT DETECTED NOT DETECTED Final   Carbapenem resistance NDM NOT DETECTED NOT DETECTED Final   Carbapenem resist OXA 48 LIKE NOT DETECTED NOT DETECTED Final   Carbapenem resistance VIM NOT DETECTED NOT DETECTED Final    Comment: Performed at [facility], [address] [place], [place] [address]  Urine Culture (for pregnant, neutropenic or urologic patients or patients with an indwelling urinary catheter)     Status: Abnormal   Collection Time: 04/28/24 11:03 AM   Specimen: Urine, Clean Catch  Result Value Ref Range Status   Specimen Description  Final    URINE, CLEAN CATCH Performed at [facility],  [address] [place], [place] [address]    Special Requests   Final    NONE Performed at [facility], [address] [place], [place] [address]    Culture 50,000 COLONIES/mL ESCHERICHIA COLI (A)  Final   Report Status 04/30/2024 FINAL  Final   Organism ID, Bacteria ESCHERICHIA COLI (A)  Final      Susceptibility   Escherichia coli - MIC*    AMPICILLIN >=32 RESISTANT Resistant     CEFAZOLIN (URINE) Value in next row Sensitive      8 SENSITIVEThis is a modified FDA-approved test that has been validated and its performance characteristics determined by the reporting laboratory.  This laboratory is certified under the Clinical Laboratory Improvement Amendments CLIA as qualified to perform high complexity clinical laboratory testing.    CEFEPIME  Value in next row Sensitive      8 SENSITIVEThis is a modified FDA-approved test that has been validated and its performance characteristics determined by the reporting laboratory.  This laboratory is certified under the Clinical Laboratory Improvement Amendments CLIA as qualified to perform high complexity clinical laboratory testing.    ERTAPENEM Value in next row Sensitive      8 SENSITIVEThis is a modified FDA-approved test that has been validated and its performance characteristics determined by the reporting laboratory.  This laboratory is certified under the Clinical Laboratory Improvement Amendments CLIA as qualified to perform high complexity clinical laboratory testing.    CEFTRIAXONE  Value in next row Sensitive      8 SENSITIVEThis is a modified FDA-approved test that has been validated and its performance characteristics determined by the reporting laboratory.  This laboratory is certified under the Clinical Laboratory Improvement Amendments CLIA as qualified to perform high complexity clinical laboratory testing.    CIPROFLOXACIN  Value in next row Sensitive      8 SENSITIVEThis is a modified FDA-approved test that has been  validated and its performance characteristics determined by the reporting laboratory.  This laboratory is certified under the Clinical Laboratory Improvement Amendments CLIA as qualified to perform high complexity clinical laboratory testing.    GENTAMICIN Value in next row Sensitive      8 SENSITIVEThis is a modified FDA-approved test that has been validated and its performance characteristics determined by the reporting laboratory.  This laboratory is certified under the Clinical Laboratory Improvement Amendments CLIA as qualified to perform high complexity clinical laboratory testing.    NITROFURANTOIN Value in next row Sensitive      8 SENSITIVEThis is a modified FDA-approved test that has been validated and its performance characteristics determined by the reporting laboratory.  This laboratory is certified under the Clinical Laboratory Improvement Amendments CLIA as qualified to perform high complexity clinical laboratory testing.    TRIMETH /SULFA  Value in next row Sensitive      8 SENSITIVEThis is a modified FDA-approved test that has been validated and its performance characteristics determined by the reporting laboratory.  This laboratory is certified under the Clinical Laboratory Improvement Amendments CLIA as qualified to perform high complexity clinical laboratory testing.    AMPICILLIN/SULBACTAM Value in next row Intermediate      8 SENSITIVEThis is a modified FDA-approved test that has been validated and its performance characteristics determined by the reporting laboratory.  This laboratory is certified under the Clinical Laboratory Improvement Amendments CLIA as qualified to perform high complexity clinical laboratory testing.    PIP/TAZO Value in next row Sensitive      <=  4 SENSITIVEThis is a modified FDA-approved test that has been validated and its performance characteristics determined by the reporting laboratory.  This laboratory is certified under the Clinical Laboratory Improvement  Amendments CLIA as qualified to perform high complexity clinical laboratory testing.    MEROPENEM Value in next row Sensitive      <=4 SENSITIVEThis is a modified FDA-approved test that has been validated and its performance characteristics determined by the reporting laboratory.  This laboratory is certified under the Clinical Laboratory Improvement Amendments CLIA as qualified to perform high complexity clinical laboratory testing.    * 50,000 COLONIES/mL ESCHERICHIA COLI  Blood Culture (routine x 2)     Status: Abnormal (Preliminary result)   Collection Time: 04/28/24  1:46 PM   Specimen: BLOOD LEFT WRIST  Result Value Ref Range Status   Specimen Description   Final    BLOOD LEFT WRIST Performed at [facility], [address], [place], [place] [address]    Special Requests   Final    BOTTLES DRAWN AEROBIC AND ANAEROBIC Blood Culture adequate volume Performed at [facility], [address] [place], [place] [address]    Culture  Setup Time (A)  Final    YEAST AEROBIC BOTTLE ONLY CRITICAL RESULT CALLED TO, READ BACK BY AND VERIFIED WITH: NATHAN BELUE @ 04/30/2024 0216 AB    Culture (A)  Final    CANDIDA ALBICANS Sent to [facility] for further susceptibility testing. Performed at [facility], [address] [place], [place] [address]    Report Status PENDING  Incomplete  Blood Culture ID Panel (Reflexed)     Status: Abnormal   Collection Time: 04/28/24  1:46 PM  Result Value Ref Range Status   Enterococcus faecalis NOT DETECTED NOT DETECTED Final   Enterococcus Faecium NOT DETECTED NOT DETECTED Final   Listeria monocytogenes NOT DETECTED NOT DETECTED Final   Staphylococcus species NOT DETECTED NOT DETECTED Final   Staphylococcus aureus (BCID) NOT DETECTED NOT DETECTED Final   Staphylococcus epidermidis NOT DETECTED NOT DETECTED Final   Staphylococcus lugdunensis NOT DETECTED NOT DETECTED Final   Streptococcus species NOT DETECTED NOT DETECTED Final    Streptococcus agalactiae NOT DETECTED NOT DETECTED Final   Streptococcus pneumoniae NOT DETECTED NOT DETECTED Final   Streptococcus pyogenes NOT DETECTED NOT DETECTED Final   A.calcoaceticus-baumannii NOT DETECTED NOT DETECTED Final   Bacteroides fragilis NOT DETECTED NOT DETECTED Final   Enterobacterales NOT DETECTED NOT DETECTED Final   Enterobacter cloacae complex NOT DETECTED NOT DETECTED Final   Escherichia coli NOT DETECTED NOT DETECTED Final   Klebsiella aerogenes NOT DETECTED NOT DETECTED Final   Klebsiella oxytoca NOT DETECTED NOT DETECTED Final   Klebsiella pneumoniae NOT DETECTED NOT DETECTED Final   Proteus species NOT DETECTED NOT DETECTED Final   Salmonella species NOT DETECTED NOT DETECTED Final   Serratia marcescens NOT DETECTED NOT DETECTED Final   Haemophilus influenzae NOT DETECTED NOT DETECTED Final   Neisseria meningitidis NOT DETECTED NOT DETECTED Final   Pseudomonas aeruginosa NOT DETECTED NOT DETECTED Final   Stenotrophomonas maltophilia NOT DETECTED NOT DETECTED Final   Candida albicans DETECTED (A) NOT DETECTED Final    Comment: CRITICAL RESULT CALLED TO, READ BACK BY AND VERIFIED WITH: NATHAN BELUE @ 04/30/2024 0216 AB    Candida auris NOT DETECTED NOT DETECTED Final   Candida glabrata DETECTED (A) NOT DETECTED Final    Comment: CRITICAL RESULT CALLED TO, READ BACK BY AND VERIFIED WITH: NATHAN BELUE @ 04/30/2024 0216 AB  Candida krusei NOT DETECTED NOT DETECTED Final   Candida parapsilosis NOT DETECTED NOT DETECTED Final   Candida tropicalis NOT DETECTED NOT DETECTED Final   Cryptococcus neoformans/gattii NOT DETECTED NOT DETECTED Final    Comment: Performed at [facility], [address] [place], [place] [address]  Culture, blood (Routine X 2) w Reflex to ID Panel     Status: None (Preliminary result)   Collection Time: 05/01/24  3:16 PM   Specimen: BLOOD  Result Value Ref Range Status   Specimen Description BLOOD BLOOD LEFT ARM  Final    Special Requests   Final    BOTTLES DRAWN AEROBIC AND ANAEROBIC Blood Culture adequate volume   Culture   Final    NO GROWTH 3 DAYS Performed at [facility], [address] [place], [place] [address]    Report Status PENDING  Incomplete  Culture, blood (Routine X 2) w Reflex to ID Panel     Status: None (Preliminary result)   Collection Time: 05/01/24  3:17 PM   Specimen: BLOOD  Result Value Ref Range Status   Specimen Description BLOOD BLOOD LEFT HAND  Final   Special Requests AEROBIC BOTTLE ONLY Blood Culture adequate volume  Final   Culture   Final    NO GROWTH 3 DAYS Performed at [facility], [address], [place], [place] [address]    Report Status PENDING  Incomplete     Time coordinating discharge:37 minutes  SIGNED:   Anthony CHRISTELLA Pouch, MD  [facility] 05/04/2024, 12:56 PM Pager   If 7PM-7AM, please contact night-coverage [email or web address]      [1] No Known Allergies

## 2024-05-04 NOTE — Plan of Care (Signed)

## 2024-05-04 NOTE — Telephone Encounter (Signed)
 Spoke with Deja at [facility] who is discharging the patient and she was provided the appointment information to give to the patient.

## 2024-05-05 ENCOUNTER — Telehealth: Payer: Self-pay | Admitting: Nurse Practitioner

## 2024-05-05 ENCOUNTER — Other Ambulatory Visit: Payer: Self-pay | Admitting: Physician Assistant

## 2024-05-05 DIAGNOSIS — S32019D Unspecified fracture of first lumbar vertebra, subsequent encounter for fracture with routine healing: Secondary | ICD-10-CM

## 2024-05-05 DIAGNOSIS — S22009A Unspecified fracture of unspecified thoracic vertebra, initial encounter for closed fracture: Secondary | ICD-10-CM

## 2024-05-05 NOTE — Telephone Encounter (Signed)
 Copied from CRM #[id number]. Topic: Clinical - Order For Equipment >> May 05, 2024 12:02 PM [name] wrote: Reason for CRM:  therapist calling to state, pt seen for first visit - TLSO brace for spinal fracture - didnt receive at hospital and would like to have picked up by son Cynthia Singh [name]

## 2024-05-05 NOTE — Telephone Encounter (Signed)
 Patient should be able to get this from Neurosurgery. I have CC'd Brooke who the patient is scheduled to see next month

## 2024-05-06 LAB — CULTURE, BLOOD (ROUTINE X 2)
Culture: NO GROWTH
Culture: NO GROWTH
Special Requests: ADEQUATE
Special Requests: ADEQUATE

## 2024-05-07 LAB — YEAST SUSCEPTIBILITIES
Amphotericin B MIC: 0.5
Fluconazole Islt MIC: 0.5
ISAVUCONAZOLE MIC: 0.008
Itraconazole MIC: 0.06
Posaconazole MIC: 0.03

## 2024-05-07 NOTE — TOC CM/SW Note (Signed)
 05/06/2024 4:24 pm: RNCM received a message from the charge nurse via secure chat asking me about a TLSO Brace that the patient's son called to tell her that they didn't receive at the time of discharge. Patient was discharged 3 days prior. She said that the son was very upset and wanted to know where the brace was and that the patient wasn't able to do PT without it.   05/07/2024: RNCM reviewed the chart and the TLSO Brace was ordered via an outside vendor on 12/19. It was ordered by outpatient. There is a note in the chart from a CMA with [facility] stating that the patient's son would pick up the brace.   9:16 am: RNCM called patient's son Chinita Schimpf @ [phone], introduced myself and my role. I explained to him that he was to pick up the TLSO Brace from [facility] in [place] and gave him the address and phone number to the doctor's office. He said that her would pick up the brace on Monday. Charge Nurse made aware.

## 2024-05-08 ENCOUNTER — Telehealth: Payer: Self-pay | Admitting: Neurosurgery

## 2024-05-08 NOTE — Telephone Encounter (Signed)
 Patient has an appointment with our office 05/29/24.  Patient did not receive a brace at the hospital for her back and [facility] does not have an order for the patient. Please advise patients son, Astra Gregg, [phone], on the pick-up information for the brace.

## 2024-05-08 NOTE — Telephone Encounter (Signed)
 Refaxed order to [facility]) for TLSO brace. Left message for [place].

## 2024-05-08 NOTE — Telephone Encounter (Signed)
 Refaxed order to [facility] on their prescription pad for TLSO brace (along with Dr Andrez consult note and demographics page) Left message for [place]. [facility] should contact them for an appointment to dispense the brace.

## 2024-05-08 NOTE — Telephone Encounter (Signed)
 Patient's family came to [facility] looking for a back brace. Thought they were told to come here  I found the order, sent them to [facility]  Patient's family went to [facility] were told that in order to process the order they needed a prescription, demographic sheet, measurement of the patient and insurance  Tamaira Ciriello (patient's son) came back to [facility]  I got in touch with [facility] in [place] [phone]) who stated the order was placed in the hospital, they have not seen the patient yet  However, [facility]surgery agreed to contact Sherida Rigg to inform them of next steps

## 2024-05-08 NOTE — Telephone Encounter (Signed)
 I called [facility] and they confirmed they have and they do not need us  to refax it. I notified Billy.

## 2024-05-08 NOTE — Telephone Encounter (Signed)
 Patient's son called and states that [facility] does not have the prescription. Can this be re-faxed?

## 2024-05-09 ENCOUNTER — Telehealth: Payer: Self-pay

## 2024-05-09 NOTE — Telephone Encounter (Signed)
 Received a call from Syeda at [facility] requesting a one time verbal order to get Cynthia Singh out of bed without her brace to take her to her appointment with [facility]. Per discussion with Lyle Decamp, PA-C, I provided this. I also explained that once she gets the brace, she should wear it any time she is up and out of bed.

## 2024-05-14 ENCOUNTER — Other Ambulatory Visit: Payer: Self-pay

## 2024-05-14 ENCOUNTER — Emergency Department

## 2024-05-14 ENCOUNTER — Inpatient Hospital Stay
Admission: EM | Admit: 2024-05-14 | Discharge: 2024-05-19 | DRG: 393 | Disposition: A | Discharge status: Skilled Nursing Facility | Attending: Student | Admitting: Student

## 2024-05-14 ENCOUNTER — Encounter: Payer: Self-pay | Admitting: Emergency Medicine

## 2024-05-14 DIAGNOSIS — I5032 Chronic diastolic (congestive) heart failure: Secondary | ICD-10-CM | POA: Diagnosis present

## 2024-05-14 DIAGNOSIS — N12 Tubulo-interstitial nephritis, not specified as acute or chronic: Secondary | ICD-10-CM

## 2024-05-14 DIAGNOSIS — G8929 Other chronic pain: Secondary | ICD-10-CM | POA: Diagnosis present

## 2024-05-14 DIAGNOSIS — F32A Depression, unspecified: Secondary | ICD-10-CM | POA: Diagnosis present

## 2024-05-14 DIAGNOSIS — Z7982 Long term (current) use of aspirin: Secondary | ICD-10-CM | POA: Diagnosis not present

## 2024-05-14 DIAGNOSIS — Z955 Presence of coronary angioplasty implant and graft: Secondary | ICD-10-CM

## 2024-05-14 DIAGNOSIS — N179 Acute kidney failure, unspecified: Secondary | ICD-10-CM | POA: Diagnosis not present

## 2024-05-14 DIAGNOSIS — K625 Hemorrhage of anus and rectum: Secondary | ICD-10-CM | POA: Diagnosis not present

## 2024-05-14 DIAGNOSIS — K626 Ulcer of anus and rectum: Principal | ICD-10-CM | POA: Diagnosis present

## 2024-05-14 DIAGNOSIS — K6289 Other specified diseases of anus and rectum: Secondary | ICD-10-CM | POA: Diagnosis present

## 2024-05-14 DIAGNOSIS — Z90710 Acquired absence of both cervix and uterus: Secondary | ICD-10-CM

## 2024-05-14 DIAGNOSIS — Z794 Long term (current) use of insulin: Secondary | ICD-10-CM

## 2024-05-14 DIAGNOSIS — K922 Gastrointestinal hemorrhage, unspecified: Secondary | ICD-10-CM | POA: Diagnosis present

## 2024-05-14 DIAGNOSIS — N151 Renal and perinephric abscess: Secondary | ICD-10-CM | POA: Diagnosis present

## 2024-05-14 DIAGNOSIS — I10 Essential (primary) hypertension: Secondary | ICD-10-CM | POA: Diagnosis present

## 2024-05-14 DIAGNOSIS — E7849 Other hyperlipidemia: Secondary | ICD-10-CM | POA: Diagnosis present

## 2024-05-14 DIAGNOSIS — E1129 Type 2 diabetes mellitus with other diabetic kidney complication: Secondary | ICD-10-CM | POA: Diagnosis present

## 2024-05-14 DIAGNOSIS — I252 Old myocardial infarction: Secondary | ICD-10-CM

## 2024-05-14 DIAGNOSIS — E1151 Type 2 diabetes mellitus with diabetic peripheral angiopathy without gangrene: Secondary | ICD-10-CM | POA: Diagnosis present

## 2024-05-14 DIAGNOSIS — K921 Melena: Secondary | ICD-10-CM | POA: Diagnosis present

## 2024-05-14 DIAGNOSIS — E1121 Type 2 diabetes mellitus with diabetic nephropathy: Secondary | ICD-10-CM

## 2024-05-14 DIAGNOSIS — R310 Gross hematuria: Secondary | ICD-10-CM | POA: Diagnosis present

## 2024-05-14 DIAGNOSIS — N3289 Other specified disorders of bladder: Secondary | ICD-10-CM | POA: Diagnosis not present

## 2024-05-14 DIAGNOSIS — K5909 Other constipation: Secondary | ICD-10-CM | POA: Diagnosis present

## 2024-05-14 DIAGNOSIS — N390 Urinary tract infection, site not specified: Secondary | ICD-10-CM | POA: Diagnosis present

## 2024-05-14 DIAGNOSIS — B9620 Unspecified Escherichia coli [E. coli] as the cause of diseases classified elsewhere: Secondary | ICD-10-CM | POA: Diagnosis present

## 2024-05-14 DIAGNOSIS — F1120 Opioid dependence, uncomplicated: Secondary | ICD-10-CM | POA: Diagnosis present

## 2024-05-14 DIAGNOSIS — Z7902 Long term (current) use of antithrombotics/antiplatelets: Secondary | ICD-10-CM | POA: Diagnosis not present

## 2024-05-14 DIAGNOSIS — Z9049 Acquired absence of other specified parts of digestive tract: Secondary | ICD-10-CM

## 2024-05-14 DIAGNOSIS — I130 Hypertensive heart and chronic kidney disease with heart failure and stage 1 through stage 4 chronic kidney disease, or unspecified chronic kidney disease: Secondary | ICD-10-CM | POA: Diagnosis present

## 2024-05-14 DIAGNOSIS — E1122 Type 2 diabetes mellitus with diabetic chronic kidney disease: Secondary | ICD-10-CM | POA: Diagnosis present

## 2024-05-14 DIAGNOSIS — Z961 Presence of intraocular lens: Secondary | ICD-10-CM | POA: Diagnosis present

## 2024-05-14 DIAGNOSIS — E785 Hyperlipidemia, unspecified: Secondary | ICD-10-CM | POA: Diagnosis present

## 2024-05-14 DIAGNOSIS — K219 Gastro-esophageal reflux disease without esophagitis: Secondary | ICD-10-CM | POA: Diagnosis present

## 2024-05-14 DIAGNOSIS — Z90722 Acquired absence of ovaries, bilateral: Secondary | ICD-10-CM

## 2024-05-14 DIAGNOSIS — N1832 Chronic kidney disease, stage 3b: Secondary | ICD-10-CM | POA: Diagnosis present

## 2024-05-14 DIAGNOSIS — I2510 Atherosclerotic heart disease of native coronary artery without angina pectoris: Secondary | ICD-10-CM | POA: Diagnosis present

## 2024-05-14 DIAGNOSIS — D631 Anemia in chronic kidney disease: Secondary | ICD-10-CM | POA: Diagnosis present

## 2024-05-14 DIAGNOSIS — Z7984 Long term (current) use of oral hypoglycemic drugs: Secondary | ICD-10-CM

## 2024-05-14 DIAGNOSIS — Z79899 Other long term (current) drug therapy: Secondary | ICD-10-CM

## 2024-05-14 HISTORY — DX: Essential (primary) hypertension: I10

## 2024-05-14 LAB — URINALYSIS, ROUTINE W REFLEX MICROSCOPIC
Bilirubin Urine: NEGATIVE
Glucose, UA: >=500 mg/dL — AB
Ketones, ur: NEGATIVE mg/dL
Nitrite: NEGATIVE
Protein, ur: NEGATIVE mg/dL
Specific Gravity, Urine: 1.015 (ref 1.005–1.030)
WBC, UA: >50 WBC/hpf (ref 0–5)
pH: 5 (ref 5.0–8.0)

## 2024-05-14 LAB — BASIC METABOLIC PANEL WITH GFR
Anion gap: 11 (ref 5–15)
BUN: 24 mg/dL — ABNORMAL HIGH (ref 8–23)
CO2: 23 mmol/L (ref 22–32)
Calcium: 8.7 mg/dL — ABNORMAL LOW (ref 8.9–10.3)
Chloride: 99 mmol/L (ref 98–111)
Creatinine, Ser: 1.61 mg/dL — ABNORMAL HIGH (ref 0.44–1.00)
GFR, Estimated: 32 mL/min — ABNORMAL LOW
Glucose, Bld: 100 mg/dL — ABNORMAL HIGH (ref 70–99)
Potassium: 4.9 mmol/L (ref 3.5–5.1)
Sodium: 133 mmol/L — ABNORMAL LOW (ref 135–145)

## 2024-05-14 LAB — CBC WITH DIFFERENTIAL/PLATELET
Abs Immature Granulocytes: 0.1 K/uL — ABNORMAL HIGH (ref 0.00–0.07)
Basophils Absolute: 0.1 K/uL (ref 0.0–0.1)
Basophils Relative: 1 %
Eosinophils Absolute: 0 K/uL (ref 0.0–0.5)
Eosinophils Relative: 0 %
HCT: 32.2 % — ABNORMAL LOW (ref 36.0–46.0)
Hemoglobin: 10.2 g/dL — ABNORMAL LOW (ref 12.0–15.0)
Immature Granulocytes: 1 %
Lymphocytes Relative: 22 %
Lymphs Abs: 3 K/uL (ref 0.7–4.0)
MCH: 28.1 pg (ref 26.0–34.0)
MCHC: 31.7 g/dL (ref 30.0–36.0)
MCV: 88.7 fL (ref 80.0–100.0)
Monocytes Absolute: 1 K/uL (ref 0.1–1.0)
Monocytes Relative: 8 %
Neutro Abs: 9.2 K/uL — ABNORMAL HIGH (ref 1.7–7.7)
Neutrophils Relative %: 68 %
Platelets: 445 K/uL — ABNORMAL HIGH (ref 150–400)
RBC: 3.63 MIL/uL — ABNORMAL LOW (ref 3.87–5.11)
RDW: 14.6 % (ref 11.5–15.5)
WBC: 13.4 K/uL — ABNORMAL HIGH (ref 4.0–10.5)
nRBC: 0 % (ref 0.0–0.2)

## 2024-05-14 LAB — GLUCOSE, CAPILLARY: Glucose-Capillary: 102 mg/dL — ABNORMAL HIGH (ref 70–99)

## 2024-05-14 MED ORDER — POLYETHYLENE GLYCOL 3350 17 G PO PACK: 17.0000 g | PACK | Freq: Every day | ORAL | Status: DC | PRN

## 2024-05-14 MED ORDER — DULOXETINE HCL 30 MG PO CPEP
30.0000 mg | ORAL_CAPSULE | Freq: Every day | ORAL | Status: DC
  Administered 2024-05-15 – 2024-05-19 (×5): 30 mg via ORAL

## 2024-05-14 MED ORDER — AMLODIPINE BESYLATE 5 MG PO TABS
2.5000 mg | ORAL_TABLET | Freq: Every day | ORAL | Status: DC
  Administered 2024-05-15 – 2024-05-18 (×4): 2.5 mg via ORAL

## 2024-05-14 MED ORDER — ACETAMINOPHEN 325 MG PO TABS: 650.0000 mg | ORAL_TABLET | Freq: Four times a day (QID) | ORAL | Status: DC | PRN

## 2024-05-14 MED ORDER — IOHEXOL 350 MG/ML SOLN
75.0000 mL | Freq: Once | INTRAVENOUS | Status: AC | PRN
  Administered 2024-05-14: 75 mL via INTRAVENOUS

## 2024-05-14 MED ORDER — OXYCODONE-ACETAMINOPHEN 10-325 MG PO TABS: 1.0000 | ORAL_TABLET | Freq: Four times a day (QID) | ORAL | Status: DC | PRN

## 2024-05-14 MED ORDER — INSULIN GLARGINE-YFGN 100 UNIT/ML ~~LOC~~ SOLN: 20.0000 [IU] | Freq: Every day | SUBCUTANEOUS | Status: DC

## 2024-05-14 MED ORDER — PANTOPRAZOLE SODIUM 40 MG IV SOLR
40.0000 mg | Freq: Two times a day (BID) | INTRAVENOUS | Status: DC
  Administered 2024-05-14 – 2024-05-19 (×10): 40 mg via INTRAVENOUS
  Filled 2024-05-14: qty 10

## 2024-05-14 MED ORDER — HYDRALAZINE HCL 20 MG/ML IJ SOLN: 5.0000 mg | INTRAMUSCULAR | Status: DC | PRN

## 2024-05-14 MED ORDER — SODIUM CHLORIDE 0.9 % IV BOLUS
1000.0000 mL | Freq: Once | INTRAVENOUS | Status: AC
  Administered 2024-05-14: 1000 mL via INTRAVENOUS

## 2024-05-14 MED ORDER — ONDANSETRON HCL 4 MG/2ML IJ SOLN: 4.0000 mg | Freq: Three times a day (TID) | INTRAMUSCULAR | Status: DC | PRN

## 2024-05-14 MED ORDER — NORTRIPTYLINE HCL 25 MG PO CAPS
150.0000 mg | ORAL_CAPSULE | Freq: Every day | ORAL | Status: DC
  Administered 2024-05-15 – 2024-05-18 (×4): 150 mg via ORAL
  Filled 2024-05-14: qty 6

## 2024-05-14 MED ORDER — SODIUM CHLORIDE 0.9 % IV SOLN
1.0000 g | Freq: Once | INTRAVENOUS | Status: AC
  Administered 2024-05-14: 1 g via INTRAVENOUS
  Filled 2024-05-14: qty 10

## 2024-05-14 MED ORDER — INSULIN ASPART 100 UNIT/ML IJ SOLN
0.0000 [IU] | Freq: Three times a day (TID) | INTRAMUSCULAR | Status: DC
  Administered 2024-05-15 – 2024-05-17 (×3): 1 [IU] via SUBCUTANEOUS
  Administered 2024-05-18 – 2024-05-19 (×4): 2 [IU] via SUBCUTANEOUS

## 2024-05-14 MED ORDER — MECLIZINE HCL 25 MG PO TABS: 12.5000 mg | ORAL_TABLET | Freq: Three times a day (TID) | ORAL | Status: DC | PRN

## 2024-05-14 MED ORDER — OXYCODONE-ACETAMINOPHEN 5-325 MG PO TABS
2.0000 | ORAL_TABLET | Freq: Four times a day (QID) | ORAL | Status: DC | PRN
  Administered 2024-05-15 – 2024-05-19 (×15): 2 via ORAL

## 2024-05-14 MED ORDER — PIPERACILLIN-TAZOBACTAM 3.375 G IVPB
3.3750 g | Freq: Three times a day (TID) | INTRAVENOUS | Status: DC
  Administered 2024-05-14 – 2024-05-19 (×15): 3.375 g via INTRAVENOUS
  Filled 2024-05-14: qty 50

## 2024-05-14 MED ORDER — INSULIN ASPART 100 UNIT/ML IJ SOLN: 0.0000 [IU] | Freq: Every day | INTRAMUSCULAR | Status: DC

## 2024-05-14 MED ORDER — CHLORHEXIDINE GLUCONATE CLOTH 2 % EX PADS
6.0000 | MEDICATED_PAD | Freq: Every day | CUTANEOUS | Status: DC
  Administered 2024-05-15 – 2024-05-19 (×5): 6 via TOPICAL

## 2024-05-14 MED ORDER — ATORVASTATIN CALCIUM 20 MG PO TABS
40.0000 mg | ORAL_TABLET | Freq: Every day | ORAL | Status: DC
  Administered 2024-05-14 – 2024-05-19 (×6): 40 mg via ORAL
  Filled 2024-05-14: qty 2

## 2024-05-14 MED ORDER — INSULIN GLARGINE 100 UNIT/ML ~~LOC~~ SOLN: 20.0000 [IU] | Freq: Every day | SUBCUTANEOUS | Status: DC

## 2024-05-14 MED ORDER — NITROGLYCERIN 0.4 MG SL SUBL: 0.4000 mg | SUBLINGUAL_TABLET | SUBLINGUAL | Status: DC | PRN

## 2024-05-14 NOTE — ED Provider Notes (Signed)
 Pts CT concerning for pyelo with ?uteral abscess.  UA +for UTI we will cover with IV rocephin .  We will admit for further workup.   Dorothyann Drivers, MD 05/14/24 2032

## 2024-05-14 NOTE — ED Provider Notes (Signed)
 "  [facility] Provider Note    Event Date/Time   First MD Initiated Contact with Patient 05/14/24 1310     (approximate)   History   Vaginal Bleeding   HPI  Cynthia Singh is a 83 y.o. female who presents to the ED for evaluation of Vaginal Bleeding   I reviewed medical DC summary from 10 days ago, admitted due to sepsis/bacteremia from UTI.  E. coli on cultures, completed antibiotics.  Baseline history of HTN, HLD, DM and CKD 3  Patient presents to the ED due to bleeding in her brief this morning.  She resides at a local SNF after this recent admission and when they checked the patient this morning noticed a lot of blood in her brief.  Patient reports that she has been constipated.  She blames known hemorrhoids on the issue.  Notably she is triage is having vaginal bleeding but she has no concerns for this but acknowledges she is uncertain where the blood may be coming from.   Physical Exam   Triage Vital Signs: ED Triage Vitals  Encounter Vitals Group     BP 05/14/24 1223 133/69     Girls Systolic BP Percentile --      Girls Diastolic BP Percentile --      Boys Systolic BP Percentile --      Boys Diastolic BP Percentile --      Pulse Rate 05/14/24 1223 88     Resp 05/14/24 1223 18     Temp 05/14/24 1223 97.7 F (36.5 C)     Temp Source 05/14/24 1223 Oral     SpO2 05/14/24 1220 98 %     Weight 05/14/24 1224 172 lb 13.5 oz (78.4 kg)     Height 05/14/24 1224 5' 8 (1.727 m)     Head Circumference --      Peak Flow --      Pain Score 05/14/24 1224 8     Pain Loc --      Pain Education --      Exclude from Growth Chart --     Most recent vital signs: Vitals:   05/14/24 1223 05/14/24 1445  BP: 133/69 (!) 122/57  Pulse: 88 83  Resp: 18   Temp: 97.7 F (36.5 C)   SpO2: 95% 100%    General: Awake, no distress.  CV:  Good peripheral perfusion.  Resp:  Normal effort.  Abd:  No distention.  MSK:  No deformity noted.  Neuro:  No focal  deficits appreciated. Other:  Chaperoned anorectal exam with nursing staff, rolled on her side she has active oozing of dark red blood from her anus.  No vaginal bleeding noted.  No stool balls noted on lubricated DRE   ED Results / Procedures / Treatments   Labs (all labs ordered are listed, but only abnormal results are displayed) Labs Reviewed  CBC WITH DIFFERENTIAL/PLATELET - Abnormal; Notable for the following components:      Result Value   WBC 13.4 (*)    RBC 3.63 (*)    Hemoglobin 10.2 (*)    HCT 32.2 (*)    Platelets 445 (*)    Neutro Abs 9.2 (*)    Abs Immature Granulocytes 0.10 (*)    All other components within normal limits  BASIC METABOLIC PANEL WITH GFR - Abnormal; Notable for the following components:   Sodium 133 (*)    Glucose, Bld 100 (*)    BUN 24 (*)  Creatinine, Ser 1.61 (*)    Calcium  8.7 (*)    GFR, Estimated 32 (*)    All other components within normal limits  URINALYSIS, ROUTINE W REFLEX MICROSCOPIC    EKG   RADIOLOGY CTA GI study pending  Official radiology report(s): No results found.  PROCEDURES and INTERVENTIONS:  Procedures  Medications  iohexol  (OMNIPAQUE ) 350 MG/ML injection 75 mL (75 mLs Intravenous Contrast Given 05/14/24 1437)     IMPRESSION / MDM / ASSESSMENT AND PLAN / ED COURSE  I reviewed the triage vital signs and the nursing notes.  Differential diagnosis includes, but is not limited to, vaginal bleeding, rectal bleeding, blood loss anemia  {Patient presents with symptoms of an acute illness or injury that is potentially life-threatening.  Patient presents with painless lower GI bleeding.  Hemodynamically stable and first hemoglobin is near baseline at 10.2.  Renal dysfunction at baseline.  Will obtain CTA GI bleed study to see if there is any active extravasation that could be addressed intravascularly.  She was on dual antiplatelet with aspirin  and Plavix  but no anticoagulation.   She would likely benefit from  medical admission for trending hemoglobins and colonoscopy.  She is signed out to oncoming physician to follow-up on CTA GI bleed study      FINAL CLINICAL IMPRESSION(S) / ED DIAGNOSES   Final diagnoses:  Lower GI bleeding     Rx / DC Orders   ED Discharge Orders     None        Note:  This document was prepared using Dragon voice recognition software and may include unintentional dictation errors.   Claudene Rover, MD 05/14/24 [phone]  "

## 2024-05-14 NOTE — H&P (Signed)
 " History and Physical    Cynthia Singh FMW:[id number] DOB: Jun 14, 1940 DOA: 05/14/2024  Referring MD/NP/PA:   PCP: Wendee Lynwood HERO, NP   Patient coming from:  The patient is coming from SNF   Chief complaint: Rectal bleeding and left flank pain  HPI: Cynthia Singh is a 82 y.o. female with medical history significant of CAD, MI, PVD, HTN, HLD, DM, dCHF, GERD, depression, CKD 3B, vertigo, left bundle blockade, chronic back pain with narcotic dependency, who presents with left flank pain and rectal bleeding.  Per report and her grandson at the bedside, pt was noted to have bleeding from bottom in facility earlier today, initially thought to be from vaginal bleeding, but per ED physician, patient actually has rectal bleeding on rectal exam.  No vaginal bleeding.  Patient does not have nausea and vomiting.  She reports mild left lower quadrant abdominal pain.  She also reports left flank pain, which is constant, aching, moderate, nonradiating, not aggravated or alleviated by any known factors.  She has burning with urination and gross hematuria.  No dysuria or urinary frequency.  Denies fever or chills.  Patient does not have chest pain, SOB and cough.  Patient has intermittent confusion at baseline.  Currently patient is alert oriented x 3.  Data reviewed independently and ED Course: pt was found to have WBC 13.4, renal function close to baseline, positive UA (hazy appearance, large amount leukocyte esterase, rare bacteria, WBC> 50), hemoglobin 10.2 (10.9 on 05/04/2024).  Temperature normal, blood pressure 161/64, heart rate 88, RR 16, oxygen saturation 98% on room air.  Patient is admitted to telemetry bed as inpatient.  Dr. Shane of urology is consulted.  Epic message will be sent to Dr. Maryruth for GI consult at 6 AM.    CT-GI bleed: 1. No CT evidence of active gastrointestinal hemorrhage. 2. Left nephrolithiasis with fluid density lesions that may represent developing left lower  renal poll cortical abscesses. 3. Question proctitis with limited evaluation due to rectal under-distension. Recommend colonoscopy status post treatment and status post complete resolution of inflammatory changes to exclude an underlying lesion. 4. Other, non-acute and/or normal findings as above.   EKG: I have personally reviewed.  Not done in ED, will get one.   ***   Review of Systems:   General: no fevers, chills, no body weight gain, fatigue HEENT: no blurry vision, hearing changes or sore throat Respiratory: no dyspnea, coughing, wheezing CV: no chest pain, no palpitations GI: no nausea, vomiting, diarrhea, constipation. Has left lower abdominal pain GU: no dysuria, increased urinary frequency, has burning on urination and hematuria  Ext: no leg edema Neuro: no unilateral weakness, numbness, or tingling, no vision change or hearing loss Skin: no rash, no skin tear. MSK: No muscle spasm, no deformity, no limitation of range of movement in spin.  Has left flank pain Heme: No easy bruising.  Travel history: No recent long distant travel.   Allergy: Allergies[1]  Past Medical History:  Diagnosis Date   CAD (coronary artery disease)    a. Remote nonobstructive disease but in 10/2012 Cath/PCI: s/p DES to RCA. b. cath 02/08/15 DES to prox LAD and balloon angioplasty of ost D1, EF 55-65%; c. 08/2019 PCI/DES prox/distal RCA (overlapping prev RCA stent). Prev placed RCA/LAD stents patent.   Concussion    SAH AFTER FALL 3/18   Depression    Diabetes mellitus, type 2 (HCC)    Dyslipidemia    Episodic mood disorder    HTN (hypertension)  Hypertension    Myocardial infarction (HCC)    2014   Obesity    Osteoarthritis of spine    knees also   Peripheral vascular disease, unspecified 2020   severe disease on home screening by insurance    Past Surgical History:  Procedure Laterality Date   ABDOMINAL HYSTERECTOMY     APPENDECTOMY  1959   CARDIAC CATHETERIZATION N/A 02/08/2015    Procedure: Left Heart Cath and Coronary Angiography;  Surgeon: Ozell Fell, MD;  Location: [facility] INVASIVE CV LAB;  Service: Cardiovascular;  Laterality: N/A;   CARDIAC CATHETERIZATION N/A 02/08/2015   Procedure: Coronary Stent Intervention;  Surgeon: Ozell Fell, MD;  Location: [facility] INVASIVE CV LAB;  Service: Cardiovascular;  Laterality: N/A;   CARDIAC CATHETERIZATION N/A 02/08/2015   Procedure: Intravascular Pressure Wire/FFR Study;  Surgeon: Ozell Fell, MD;  Location: [facility] INVASIVE CV LAB;  Service: Cardiovascular;  Laterality: N/A;   CATARACT EXTRACTION W/PHACO Left 11/10/2016   Procedure: CATARACT EXTRACTION PHACO AND INTRAOCULAR LENS PLACEMENT (IOC) suture placed in left eye at end of procedure;  Surgeon: Jaye Fallow, MD;  Location: [facility] ORS;  Service: Ophthalmology;  Laterality: Left;  US   3.20 AP% 27.7 CDE 55.63 Fluid pack lot # [id number]   CHOLECYSTECTOMY     COMBINED HYSTERECTOMY ABDOMINAL W/ A&P REPAIR / OOPHORECTOMY  1968   CORONARY ANGIOPLASTY     STENTS X 5   CORONARY STENT INTERVENTION N/A 09/19/2019   Procedure: CORONARY STENT INTERVENTION;  Surgeon: Mady Bruckner, MD;  Location: [facility] INVASIVE CV LAB;  Service: Cardiovascular;  Laterality: N/A;   CORONARY STENT PLACEMENT  2014   DILATION AND CURETTAGE OF UTERUS     GALLBLADDER SURGERY  1968   LEFT HEART CATH AND CORONARY ANGIOGRAPHY Left 09/19/2019   Procedure: LEFT HEART CATH AND CORONARY ANGIOGRAPHY;  Surgeon: Mady Bruckner, MD;  Location: [facility] INVASIVE CV LAB;  Service: Cardiovascular;  Laterality: Left;   LEFT HEART CATHETERIZATION WITH CORONARY ANGIOGRAM N/A 10/20/2012   Procedure: LEFT HEART CATHETERIZATION WITH CORONARY ANGIOGRAM;  Surgeon: Ozell Fell, MD;  Location: [facility] CATH LAB;  Service: Cardiovascular;  Laterality: N/A;   multiple D&C     TONSILLECTOMY  age 15    Social History:  reports that she has never smoked. She has been exposed to tobacco smoke. She has never used smokeless tobacco. She reports  that she does not drink alcohol and does not use drugs.  Family History:  Family History  Adopted: Yes  Problem Relation Age of Onset   Cancer Brother        Colon   Cancer Son    Coronary artery disease Neg Hx    Heart attack Neg Hx      Prior to Admission medications  Medication Sig Start Date End Date Taking? Authorizing Provider  amLODipine  (NORVASC ) 2.5 MG tablet Take 1 tablet (2.5 mg total) by mouth daily. 02/16/24  Yes Wendee Lynwood HERO, NP  aspirin  EC 81 MG tablet Take 81 mg by mouth daily.   Yes [provider]  atorvastatin  (LIPITOR ) 40 MG tablet TAKE 1 TABLET BY MOUTH EVERY DAY 05/13/23  Yes Jimmy Ade I, MD  clopidogrel  (PLAVIX ) 75 MG tablet TAKE 1 TABLET BY MOUTH EVERY DAY 07/26/23  Yes Letvak, Richard I, MD  DULoxetine  (CYMBALTA ) 30 MG capsule TAKE 1 CAPSULE BY MOUTH EVERY DAY 04/26/24  Yes Wendee Lynwood HERO, NP  empagliflozin  (JARDIANCE ) 25 MG TABS tablet Take 1 tablet (25 mg total) by mouth daily before breakfast. 08/11/23  Yes Jimmy Ade FERNS,  MD  insulin  glargine (LANTUS  SOLOSTAR) 100 UNIT/ML Solostar Pen Inject 40 Units into the skin daily. TAKE 40 UNITS DAILY UNDER SKIN 05/10/23  Yes Jimmy Charlie FERNS, MD  meclizine  (ANTIVERT ) 12.5 MG tablet Take 1-2 tablets (12.5-25 mg total) by mouth 3 (three) times daily as needed for dizziness. Patient taking differently: Take 12.5 mg by mouth 3 (three) times daily as needed for dizziness. 01/05/23  Yes Jimmy Charlie FERNS, MD  metFORMIN  (GLUCOPHAGE -XR) 500 MG 24 hr tablet TAKE 2 TABLETS (1,000 MG TOTAL) BY MOUTH IN THE MORNING AND AT BEDTIME. 04/17/24  Yes Wendee Lynwood HERO, NP  nitroGLYCERIN  (NITROSTAT ) 0.4 MG SL tablet Place 1 tablet (0.4 mg total) under the tongue every 5 (five) minutes as needed for chest pain. 08/24/19  Yes Jimmy Charlie FERNS, MD  nortriptyline  (PAMELOR ) 50 MG capsule Take 3 capsules (150 mg total) by mouth at bedtime. 02/18/24  Yes Wendee Lynwood HERO, NP  oxyCODONE -acetaminophen  (PERCOCET) 10-325 MG tablet Take 1  tablet by mouth every 6 (six) hours as needed for pain. 05/09/24  Yes [provider]  polyethylene glycol (MIRALAX  / GLYCOLAX ) packet Take 17 g by mouth daily as needed.    Yes [provider]  senna (SENOKOT) 8.6 MG TABS tablet Take 1 tablet by mouth every evening.   Yes [provider]  Insulin  Pen Needle (PEN NEEDLES) 32G X 5 MM MISC 1 Units by Does not apply route daily. 03/24/22   Jimmy Charlie FERNS, MD    Physical Exam: Vitals:   05/14/24 1606 05/14/24 1730 05/14/24 1800 05/14/24 1900  BP: 110/61 (!) 140/62 (!) 146/67 (!) 161/64  Pulse: 85 82 79 79  Resp: 18   16  Temp:    98.2 F (36.8 C)  TempSrc:    Oral  SpO2: 96% 100% 97% 98%  Weight:      Height:       General: Not in acute distress HEENT:       Eyes: PERRL, EOMI, no jaundice       ENT: No discharge from the ears and nose, no pharynx injection, no tonsillar enlargement.        Neck: No JVD, no bruit, no mass felt. Heme: No neck lymph node enlargement. Cardiac: S1/S2, RRR, No murmurs, No gallops or rubs. Respiratory: No rales, wheezing, rhonchi or rubs. GI: Soft, nondistended, has mild tenderness in LLQ, no rebound pain, no organomegaly, BS present. GU: has positive left CVA tenderness Ext: No pitting leg edema bilaterally. 1+DP/PT pulse bilaterally. Musculoskeletal: No joint deformities, No joint redness or warmth, no limitation of ROM in spin. Skin: No rashes.  Neuro: Alert, oriented X3, cranial nerves II-XII grossly intact, moves all extremities normally.  Psych: Patient is not psychotic, no suicidal or hemocidal ideation.  Labs on Admission: I have personally reviewed following labs and imaging studies  CBC: Recent Labs  Lab 05/14/24 1226  WBC 13.4*  NEUTROABS 9.2*  HGB 10.2*  HCT 32.2*  MCV 88.7  PLT 445*   Basic Metabolic Panel: Recent Labs  Lab 05/14/24 1226  NA 133*  K 4.9  CL 99  CO2 23  GLUCOSE 100*  BUN 24*  CREATININE 1.61*  CALCIUM  8.7*   GFR: Estimated  Creatinine Clearance: 30.2 mL/min (A) (by C-G formula based on SCr of 1.61 mg/dL (H)). Liver Function Tests: No results for input(s): AST, ALT, ALKPHOS, BILITOT, PROT, ALBUMIN in the last 168 hours. No results for input(s): LIPASE, AMYLASE in the last 168 hours. No results for input(s): AMMONIA  in the last 168 hours. Coagulation Profile: No results for input(s): INR, PROTIME in the last 168 hours. Cardiac Enzymes: No results for input(s): CKTOTAL, CKMB, CKMBINDEX, TROPONINI in the last 168 hours. BNP (last 3 results) No results for input(s): PROBNP in the last 8760 hours. HbA1C: No results for input(s): HGBA1C in the last 72 hours. CBG: No results for input(s): GLUCAP in the last 168 hours. Lipid Profile: No results for input(s): CHOL, HDL, LDLCALC, TRIG, CHOLHDL, LDLDIRECT in the last 72 hours. Thyroid Function Tests: No results for input(s): TSH, T4TOTAL, FREET4, T3FREE, THYROIDAB in the last 72 hours. Anemia Panel: No results for input(s): VITAMINB12, FOLATE, FERRITIN, TIBC, IRON , RETICCTPCT in the last 72 hours. Urine analysis:    Component Value Date/Time   COLORURINE YELLOW (A) 05/14/2024 1900   APPEARANCEUR HAZY (A) 05/14/2024 1900   LABSPEC 1.015 05/14/2024 1900   PHURINE 5.0 05/14/2024 1900   GLUCOSEU >=500 (A) 05/14/2024 1900   HGBUR LARGE (A) 05/14/2024 1900   BILIRUBINUR NEGATIVE 05/14/2024 1900   BILIRUBINUR neg 06/14/2019 1408   KETONESUR NEGATIVE 05/14/2024 1900   PROTEINUR NEGATIVE 05/14/2024 1900   UROBILINOGEN 0.2 06/14/2019 1408   UROBILINOGEN 1.0 10/19/2012 1030   NITRITE NEGATIVE 05/14/2024 1900   LEUKOCYTESUR LARGE (A) 05/14/2024 1900   Sepsis Labs: @LABRCNTIP (procalcitonin:4,lacticidven:4) )No results found for this or any previous visit (from the past 240 hours).   Radiological Exams on Admission:   Assessment/Plan Principal Problem:   Renal abscess, left Active Problems:    Rectal bleeding   Proctitis   HTN (hypertension)   CAD (coronary artery disease)   HLD (hyperlipidemia)   Chronic diastolic CHF (congestive heart failure) (HCC)   Type II diabetes mellitus with renal manifestations (HCC)   Chronic kidney disease, stage 3b (HCC)   Chronic narcotic dependence (HCC)   Depression   Assessment and Plan:  35F, hx of CAD, MI, PVD, HTN, HLD, DM, dCHF, GERD, depression, CKD 3B, vertigo, left bundle blockade, chronic back pain with narcotic dependency, admitted due to rectal bleeding. Hemoglobin  10.9 --> 10.2. Also report left flank pain. CT scan showed possible left renal abscess and possible proctitis. WBC 13.4, no fever.  Not septic.  Started Zosyn .  Renal abscess, left:     Active Problems:   Rectal bleeding   Proctitis   HTN (hypertension)   CAD (coronary artery disease)   HLD (hyperlipidemia)   Chronic diastolic CHF (congestive heart failure) (HCC)   Type II diabetes mellitus with renal manifestations (HCC)   Chronic kidney disease, stage 3b (HCC)   Chronic narcotic dependence (HCC)   Depression        DVT ppx: SCD  Code Status: Full code per pt and her grandson  Family Communication:     Yes, patient's grandson at bed side.      Disposition Plan:  SNF  Consults called:  Dr. Shane of urology is consulted.  Epic message will be sent to Dr. Maryruth for GI consult at 6 AM.  Admission status and Level of care: Telemetry:  as inpt        Dispo: The patient is from: SNF              Anticipated d/c is to: SNF              Anticipated d/c date is: 2 days              Patient currently is not medically stable to d/c.    Severity of Illness:  The appropriate patient status for this patient is INPATIENT. Inpatient status is judged to be reasonable and necessary in order to provide the required intensity of service to ensure the patient's safety. The patient's presenting symptoms, physical exam findings, and initial radiographic  and laboratory data in the context of their chronic comorbidities is felt to place them at high risk for further clinical deterioration. Furthermore, it is not anticipated that the patient will be medically stable for discharge from the hospital within 2 midnights of admission.   * I certify that at the point of admission it is my clinical judgment that the patient will require inpatient hospital care spanning beyond 2 midnights from the point of admission due to high intensity of service, high risk for further deterioration and high frequency of surveillance required.*       Date of Service 05/14/2024    Caleb Exon [facility]   If 7PM-7AM, please contact night-coverage [email or web address] 05/14/2024, 9:33 PM     [1] No Known Allergies  "

## 2024-05-14 NOTE — ED Triage Notes (Signed)
 Pt arrives via EMS from [facility] with reports of vaginal bleeding noticed this morning when rolling pt. Reports baseline is intermittent confusion. Pt reports back pain but no abd pain. States she has had hemorrhoids.

## 2024-05-14 NOTE — ED Notes (Signed)
 Pt son called and asked for an update. This RN advised due to HIPAA, I was unable to provide more information other than she is here, waiting on some test results and would be admitted and he was welcome to speak to her. He then was upset and speaking with someone in the background and hung up on me.

## 2024-05-14 NOTE — ED Notes (Signed)
 Attempted to I&O straight cath patient. No urine after mutliple attempts. Cleaned pt of blood and new brief placed. MD made aware.

## 2024-05-14 NOTE — Consult Note (Addendum)
 Was called for management of concern for possible L renal abscess.  34 F w/ multiple medical comorbidities including CAD, MI, PVD, HTN, HLD, DM, dCHF, GERD, depression, CKD3b, vertigo, LBB, and chronic optiod use for back pain initially brought to hospital for GI bleed but CT demonstrated possible L lower pole renal abscess.   After discussing with the IM physician she has no significant symptoms other than mild LLQ abdominal pain. No N/V. Per records she has some dysuria. No N/V. Her vitals are WNL. Pt urinated in the ER into [place]wick and there was no GH.    WBC is 13.4, Cr is at baseline of 1.61. Vital signs are WNL.  CT demonstrates no hydronephrosis bilaterally there are some small loculated areas in the left lower pole the kidney that appear to be potentially developing abscesses's largest 1 is seen in the coronal view at 3 cm in diameter there are no obstructing ureteral stones bilaterally the bladder is very distended on imaging.  There appear to be no masses or other irregularities within the bladder.   Assessment: 36 F w/ possible small L renal abscess all less than 3cm in greatest diameter. Based on abscess size of less than 3cm and patient not being immunocompromised (DM2 most recent A1c 7.1) and no signs of sepsis would recommend conservative management with broad spectrum IV abx and reimaging prior to DC.   PT has severely distended bladder - should have catheter placed for duration of hospitalization and void trial at discharge.   Recommendations: # L lower pole renal abscess - management conservatively IV abx - total duration at of abx should be at least 2 weeks.  - urine culture should be obtained and blood cultures  - once cultures results can narrow spectrum  - no indication for IR drainage of abscess based on size less than 5cm (larges abscess 3cm) and no evidence of immunocompromised patient  - if patient becomes unstable reimage and consider IR drainage  - no immediate  intervention recommended by urology   # urinary retention  - recommend catheter placement based on severely distended bladder on CT - pt can be void trialed at discharge   I did not see this patient personally. The patient will be seen by the [facility] team tomorrow morning.

## 2024-05-15 DIAGNOSIS — K625 Hemorrhage of anus and rectum: Secondary | ICD-10-CM | POA: Diagnosis not present

## 2024-05-15 DIAGNOSIS — N151 Renal and perinephric abscess: Secondary | ICD-10-CM | POA: Diagnosis not present

## 2024-05-15 DIAGNOSIS — N3289 Other specified disorders of bladder: Secondary | ICD-10-CM

## 2024-05-15 DIAGNOSIS — K6289 Other specified diseases of anus and rectum: Secondary | ICD-10-CM | POA: Diagnosis not present

## 2024-05-15 LAB — BASIC METABOLIC PANEL WITH GFR
Anion gap: 15 (ref 5–15)
BUN: 22 mg/dL (ref 8–23)
CO2: 21 mmol/L — ABNORMAL LOW (ref 22–32)
Calcium: 8.6 mg/dL — ABNORMAL LOW (ref 8.9–10.3)
Chloride: 100 mmol/L (ref 98–111)
Creatinine, Ser: 1.61 mg/dL — ABNORMAL HIGH (ref 0.44–1.00)
GFR, Estimated: 32 mL/min — ABNORMAL LOW
Glucose, Bld: 75 mg/dL (ref 70–99)
Potassium: 4.6 mmol/L (ref 3.5–5.1)
Sodium: 135 mmol/L (ref 135–145)

## 2024-05-15 LAB — GLUCOSE, CAPILLARY
Glucose-Capillary: 103 mg/dL — ABNORMAL HIGH (ref 70–99)
Glucose-Capillary: 146 mg/dL — ABNORMAL HIGH (ref 70–99)
Glucose-Capillary: 162 mg/dL — ABNORMAL HIGH (ref 70–99)

## 2024-05-15 LAB — TYPE AND SCREEN
ABO/RH(D): A POS
Antibody Screen: NEGATIVE

## 2024-05-15 LAB — PRO BRAIN NATRIURETIC PEPTIDE: Pro Brain Natriuretic Peptide: 732 pg/mL — ABNORMAL HIGH

## 2024-05-15 LAB — PROTIME-INR
INR: 1.1 (ref 0.8–1.2)
Prothrombin Time: 14.5 s (ref 11.4–15.2)

## 2024-05-15 LAB — APTT: aPTT: 30 s (ref 24–36)

## 2024-05-15 MED ORDER — POLYETHYLENE GLYCOL 3350 17 G PO PACK
17.0000 g | PACK | Freq: Two times a day (BID) | ORAL | Status: DC
  Administered 2024-05-15 – 2024-05-19 (×8): 17 g via ORAL
  Filled 2024-05-15 (×8): qty 1

## 2024-05-15 MED ORDER — ENSURE PLUS HIGH PROTEIN PO LIQD
237.0000 mL | Freq: Two times a day (BID) | ORAL | Status: DC
  Administered 2024-05-15 – 2024-05-19 (×6): 237 mL via ORAL

## 2024-05-15 MED ORDER — MAGNESIUM CITRATE PO SOLN
1.0000 | Freq: Once | ORAL | Status: AC
  Administered 2024-05-15: 1 via ORAL
  Filled 2024-05-15: qty 296

## 2024-05-15 NOTE — Consult Note (Signed)
 "   Urology Consult  I have been asked to see the patient by Dr. Hilma, for evaluation and management of left flank pain, renal abscess.  Chief Complaint: Blood in Depends  History of Present Illness: Cynthia Singh is a 83 y.o. year old female with PMH diabetes, CAD, PVD, diastolic CHF, CKD 3B, and back pain on chronic opioids admitted overnight with left flank pain and rectal bleeding.  CT angio GI bleed on presentation notable for left nephrolithiasis and several left lower pole fluid collections consistent with abscesses measuring up to 3 cm, a distended urinary bladder, and evidence of proctitis.  Admission labs notable for UA with >50 WBC/hpf, 11-20 RBCs/hpf, and rare bacteria; white count 13.4; and creatinine 1.61, at baseline.  Urine culture pending, on antibiotics as below.  She has been afebrile, VSS.  This morning, creatinine is stable at 1.61.  Dr. Shane recommended Foley catheter placement.  Notably, she was admitted at [facility] from 04/28/2024 to 05/04/2024 with sepsis due to gram-negative left pyelonephritis.  Foley catheter in place draining clear, yellow urine.  She reports feeling somewhat better since her arrival yesterday.  Pain is stable but somewhat improved, though she admits that she has some left rib fractures that are contributory.  Anti-infectives (From admission, onward)    Start     Dose/Rate Route Frequency Ordered Stop   05/14/24 2200  piperacillin -tazobactam (ZOSYN ) IVPB 3.375 g        3.375 g 12.5 mL/hr over 240 Minutes Intravenous Every 8 hours 05/14/24 2112     05/14/24 1845  cefTRIAXone  (ROCEPHIN ) 1 g in sodium chloride  0.9 % 100 mL IVPB        1 g 200 mL/hr over 30 Minutes Intravenous  Once 05/14/24 1831 05/14/24 1921        Past Medical History:  Diagnosis Date   CAD (coronary artery disease)    a. Remote nonobstructive disease but in 10/2012 Cath/PCI: s/p DES to RCA. b. cath 02/08/15 DES to prox LAD and balloon angioplasty of ost D1, EF  55-65%; c. 08/2019 PCI/DES prox/distal RCA (overlapping prev RCA stent). Prev placed RCA/LAD stents patent.   Concussion    SAH AFTER FALL 3/18   Depression    Diabetes mellitus, type 2 (HCC)    Dyslipidemia    Episodic mood disorder    HTN (hypertension)    Hypertension    Myocardial infarction (HCC)    2014   Obesity    Osteoarthritis of spine    knees also   Peripheral vascular disease, unspecified 2020   severe disease on home screening by insurance    Past Surgical History:  Procedure Laterality Date   ABDOMINAL HYSTERECTOMY     APPENDECTOMY  1959   CARDIAC CATHETERIZATION N/A 02/08/2015   Procedure: Left Heart Cath and Coronary Angiography;  Surgeon: Ozell Fell, MD;  Location: [facility] INVASIVE CV LAB;  Service: Cardiovascular;  Laterality: N/A;   CARDIAC CATHETERIZATION N/A 02/08/2015   Procedure: Coronary Stent Intervention;  Surgeon: Ozell Fell, MD;  Location: [facility] INVASIVE CV LAB;  Service: Cardiovascular;  Laterality: N/A;   CARDIAC CATHETERIZATION N/A 02/08/2015   Procedure: Intravascular Pressure Wire/FFR Study;  Surgeon: Ozell Fell, MD;  Location: [facility] INVASIVE CV LAB;  Service: Cardiovascular;  Laterality: N/A;   CATARACT EXTRACTION W/PHACO Left 11/10/2016   Procedure: CATARACT EXTRACTION PHACO AND INTRAOCULAR LENS PLACEMENT (IOC) suture placed in left eye at end of procedure;  Surgeon: Jaye Fallow, MD;  Location: [facility] ORS;  Service: Ophthalmology;  Laterality: Left;  US   3.20 AP% 27.7 CDE 55.63 Fluid pack lot # [id number]   CHOLECYSTECTOMY     COMBINED HYSTERECTOMY ABDOMINAL W/ A&P REPAIR / OOPHORECTOMY  1968   CORONARY ANGIOPLASTY     STENTS X 5   CORONARY STENT INTERVENTION N/A 09/19/2019   Procedure: CORONARY STENT INTERVENTION;  Surgeon: Mady Bruckner, MD;  Location: [facility] INVASIVE CV LAB;  Service: Cardiovascular;  Laterality: N/A;   CORONARY STENT PLACEMENT  2014   DILATION AND CURETTAGE OF UTERUS     GALLBLADDER SURGERY  1968   LEFT HEART CATH AND  CORONARY ANGIOGRAPHY Left 09/19/2019   Procedure: LEFT HEART CATH AND CORONARY ANGIOGRAPHY;  Surgeon: Mady Bruckner, MD;  Location: [facility] INVASIVE CV LAB;  Service: Cardiovascular;  Laterality: Left;   LEFT HEART CATHETERIZATION WITH CORONARY ANGIOGRAM N/A 10/20/2012   Procedure: LEFT HEART CATHETERIZATION WITH CORONARY ANGIOGRAM;  Surgeon: Ozell Fell, MD;  Location: [facility] CATH LAB;  Service: Cardiovascular;  Laterality: N/A;   multiple D&C     TONSILLECTOMY  age 53    Home Medications:  Active Medications[1]  Allergies: Allergies[2]  Family History  Adopted: Yes  Problem Relation Age of Onset   Cancer Brother        Colon   Cancer Son    Coronary artery disease Neg Hx    Heart attack Neg Hx     Social History:  reports that she has never smoked. She has been exposed to tobacco smoke. She has never used smokeless tobacco. She reports that she does not drink alcohol and does not use drugs.  ROS: A complete review of systems was performed.  All systems are negative except for pertinent findings as noted.  Physical Exam:  Vital signs in last 24 hours: Temp:  [97.5 F (36.4 C)-98.2 F (36.8 C)] 97.5 F (36.4 C) (12/29 0731) Pulse Rate:  [70-88] 76 (12/29 1149) Resp:  [14-20] 20 (12/29 1149) BP: (110-164)/(55-69) 137/55 (12/29 1149) SpO2:  [95 %-100 %] 98 % (12/29 1149) Weight:  [78.4 kg] 78.4 kg (12/28 1224) Constitutional:  Alert and oriented, no acute distress HEENT: [place] AT, moist mucus membranes Cardiovascular: No clubbing, cyanosis, or edema Respiratory: Normal respiratory effort Skin: No rashes, bruises or suspicious lesions Neurologic: Grossly intact, no focal deficits, moving all 4 extremities Psychiatric: Normal mood and affect  Laboratory Data:  Recent Labs    05/14/24 1226  WBC 13.4*  HGB 10.2*  HCT 32.2*   Recent Labs    05/14/24 1226 05/15/24 1026  NA 133* 135  K 4.9 4.6  CL 99 100  CO2 23 21*  GLUCOSE 100* 75  BUN 24* 22  CREATININE 1.61* 1.61*   CALCIUM  8.7* 8.6*   Recent Labs    05/15/24 1026  INR 1.1   Urinalysis    Component Value Date/Time   COLORURINE YELLOW (A) 05/14/2024 1900   APPEARANCEUR HAZY (A) 05/14/2024 1900   LABSPEC 1.015 05/14/2024 1900   PHURINE 5.0 05/14/2024 1900   GLUCOSEU >=500 (A) 05/14/2024 1900   HGBUR LARGE (A) 05/14/2024 1900   BILIRUBINUR NEGATIVE 05/14/2024 1900   BILIRUBINUR neg 06/14/2019 1408   KETONESUR NEGATIVE 05/14/2024 1900   PROTEINUR NEGATIVE 05/14/2024 1900   UROBILINOGEN 0.2 06/14/2019 1408   UROBILINOGEN 1.0 10/19/2012 1030   NITRITE NEGATIVE 05/14/2024 1900   LEUKOCYTESUR LARGE (A) 05/14/2024 1900   Results for orders placed or performed during the hospital encounter of 04/28/24  Resp panel by RT-PCR (RSV, Flu A&B, Covid) Anterior Nasal Swab     Status:  None   Collection Time: 04/28/24  8:30 AM   Specimen: Anterior Nasal Swab  Result Value Ref Range Status   SARS Coronavirus 2 by RT PCR NEGATIVE NEGATIVE Final    Comment: (NOTE) SARS-CoV-2 target nucleic acids are NOT DETECTED.  The SARS-CoV-2 RNA is generally detectable in upper respiratory specimens during the acute phase of infection. The lowest concentration of SARS-CoV-2 viral copies this assay can detect is 138 copies/mL. A negative result does not preclude SARS-Cov-2 infection and should not be used as the sole basis for treatment or other patient management decisions. A negative result may occur with  improper specimen collection/handling, submission of specimen other than nasopharyngeal swab, presence of viral mutation(s) within the areas targeted by this assay, and inadequate number of viral copies(<138 copies/mL). A negative result must be combined with clinical observations, patient history, and epidemiological information. The expected result is Negative.  Fact Sheet for Patients:  bloggercourse.com  Fact Sheet for Healthcare Providers:   seriousbroker.it  This test is no t yet approved or cleared by the [place]  FDA and  has been authorized for detection and/or diagnosis of SARS-CoV-2 by FDA under an Emergency Use Authorization (EUA). This EUA will remain  in effect (meaning this test can be used) for the duration of the COVID-19 declaration under Section 564(b)(1) of the Act, 21 U.S.C.section 360bbb-3(b)(1), unless the authorization is terminated  or revoked sooner.       Influenza A by PCR NEGATIVE NEGATIVE Final   Influenza B by PCR NEGATIVE NEGATIVE Final    Comment: (NOTE) The Xpert Xpress SARS-CoV-2/FLU/RSV plus assay is intended as an aid in the diagnosis of influenza from Nasopharyngeal swab specimens and should not be used as a sole basis for treatment. Nasal washings and aspirates are unacceptable for Xpert Xpress SARS-CoV-2/FLU/RSV testing.  Fact Sheet for Patients: bloggercourse.com  Fact Sheet for Healthcare Providers: seriousbroker.it  This test is not yet approved or cleared by the [place]  FDA and has been authorized for detection and/or diagnosis of SARS-CoV-2 by FDA under an Emergency Use Authorization (EUA). This EUA will remain in effect (meaning this test can be used) for the duration of the COVID-19 declaration under Section 564(b)(1) of the Act, 21 U.S.C. section 360bbb-3(b)(1), unless the authorization is terminated or revoked.     Resp Syncytial Virus by PCR NEGATIVE NEGATIVE Final    Comment: (NOTE) Fact Sheet for Patients: bloggercourse.com  Fact Sheet for Healthcare Providers: seriousbroker.it  This test is not yet approved or cleared by the [place]  FDA and has been authorized for detection and/or diagnosis of SARS-CoV-2 by FDA under an Emergency Use Authorization (EUA). This EUA will remain in effect (meaning this test can be used) for  the duration of the COVID-19 declaration under Section 564(b)(1) of the Act, 21 U.S.C. section 360bbb-3(b)(1), unless the authorization is terminated or revoked.  Performed at [facility], [address] [place], [place] [address]   Blood Culture (routine x 2)     Status: Abnormal   Collection Time: 04/28/24  8:38 AM   Specimen: BLOOD  Result Value Ref Range Status   Specimen Description   Final    BLOOD BLOOD LEFT ARM Performed at [facility], [address], [place], [place] [address]    Special Requests   Final    BOTTLES DRAWN AEROBIC AND ANAEROBIC Blood Culture adequate volume Performed at [facility], [address] [place], [place] [address]    Culture  Setup Time  Final    IN BOTH AEROBIC AND ANAEROBIC BOTTLES GRAM NEGATIVE RODS CRITICAL RESULT CALLED TO, READ BACK BY AND VERIFIED WITH: NATHAN BELUE @ 04/28/2024 2243 AB Performed at [facility], [address] [place], [place] [address]    Culture ESCHERICHIA COLI (A)  Final   Report Status 05/01/2024 FINAL  Final   Organism ID, Bacteria ESCHERICHIA COLI  Final      Susceptibility   Escherichia coli - MIC*    AMPICILLIN >=32 RESISTANT Resistant     CEFAZOLIN (NON-URINE) 8 RESISTANT Resistant     CEFEPIME  <=0.12 SENSITIVE Sensitive     ERTAPENEM <=0.12 SENSITIVE Sensitive     CEFTRIAXONE  <=0.25 SENSITIVE Sensitive     CIPROFLOXACIN  <=0.06 SENSITIVE Sensitive     GENTAMICIN <=1 SENSITIVE Sensitive     MEROPENEM <=0.25 SENSITIVE Sensitive     TRIMETH /SULFA  <=20 SENSITIVE Sensitive     AMPICILLIN/SULBACTAM >=32 RESISTANT Resistant     PIP/TAZO Value in next row Sensitive      <=4 SENSITIVEThis is a modified FDA-approved test that has been validated and its performance characteristics determined by the reporting laboratory.  This laboratory is certified under the Clinical Laboratory Improvement Amendments CLIA as qualified to perform high complexity clinical laboratory testing.     * ESCHERICHIA COLI  Blood Culture ID Panel (Reflexed)     Status: Abnormal   Collection Time: 04/28/24  8:38 AM  Result Value Ref Range Status   Enterococcus faecalis NOT DETECTED NOT DETECTED Final   Enterococcus Faecium NOT DETECTED NOT DETECTED Final   Listeria monocytogenes NOT DETECTED NOT DETECTED Final   Staphylococcus species NOT DETECTED NOT DETECTED Final   Staphylococcus aureus (BCID) NOT DETECTED NOT DETECTED Final   Staphylococcus epidermidis NOT DETECTED NOT DETECTED Final   Staphylococcus lugdunensis NOT DETECTED NOT DETECTED Final   Streptococcus species NOT DETECTED NOT DETECTED Final   Streptococcus agalactiae NOT DETECTED NOT DETECTED Final   Streptococcus pneumoniae NOT DETECTED NOT DETECTED Final   Streptococcus pyogenes NOT DETECTED NOT DETECTED Final   A.calcoaceticus-baumannii NOT DETECTED NOT DETECTED Final   Bacteroides fragilis NOT DETECTED NOT DETECTED Final   Enterobacterales DETECTED (A) NOT DETECTED Final    Comment: Enterobacterales represent a large order of gram negative bacteria, not a single organism. CRITICAL RESULT CALLED TO, READ BACK BY AND VERIFIED WITH: NATHAN BELUE @ 04/28/2024 2243 AB    Enterobacter cloacae complex NOT DETECTED NOT DETECTED Final   Escherichia coli DETECTED (A) NOT DETECTED Final    Comment: CRITICAL RESULT CALLED TO, READ BACK BY AND VERIFIED WITH: NATHAN BELUE @ 04/28/2024 2243 AB    Klebsiella aerogenes NOT DETECTED NOT DETECTED Final   Klebsiella oxytoca NOT DETECTED NOT DETECTED Final   Klebsiella pneumoniae NOT DETECTED NOT DETECTED Final   Proteus species NOT DETECTED NOT DETECTED Final   Salmonella species NOT DETECTED NOT DETECTED Final   Serratia marcescens NOT DETECTED NOT DETECTED Final   Haemophilus influenzae NOT DETECTED NOT DETECTED Final   Neisseria meningitidis NOT DETECTED NOT DETECTED Final   Pseudomonas aeruginosa NOT DETECTED NOT DETECTED Final   Stenotrophomonas maltophilia NOT DETECTED NOT  DETECTED Final   Candida albicans NOT DETECTED NOT DETECTED Final   Candida auris NOT DETECTED NOT DETECTED Final   Candida glabrata NOT DETECTED NOT DETECTED Final   Candida krusei NOT DETECTED NOT DETECTED Final   Candida parapsilosis NOT DETECTED NOT DETECTED Final   Candida tropicalis NOT DETECTED NOT DETECTED Final   Cryptococcus neoformans/gattii NOT DETECTED NOT  DETECTED Final   CTX-M ESBL NOT DETECTED NOT DETECTED Final   Carbapenem resistance IMP NOT DETECTED NOT DETECTED Final   Carbapenem resistance KPC NOT DETECTED NOT DETECTED Final   Carbapenem resistance NDM NOT DETECTED NOT DETECTED Final   Carbapenem resist OXA 48 LIKE NOT DETECTED NOT DETECTED Final   Carbapenem resistance VIM NOT DETECTED NOT DETECTED Final    Comment: Performed at [facility], [address] [place], [place] [address]  Urine Culture (for pregnant, neutropenic or urologic patients or patients with an indwelling urinary catheter)     Status: Abnormal   Collection Time: 04/28/24 11:03 AM   Specimen: Urine, Clean Catch  Result Value Ref Range Status   Specimen Description   Final    URINE, CLEAN CATCH Performed at [facility], [address] [place], [place] [address]    Special Requests   Final    NONE Performed at [facility], [address] [place], [place] [address]    Culture 50,000 COLONIES/mL ESCHERICHIA COLI (A)  Final   Report Status 04/30/2024 FINAL  Final   Organism ID, Bacteria ESCHERICHIA COLI (A)  Final      Susceptibility   Escherichia coli - MIC*    AMPICILLIN >=32 RESISTANT Resistant     CEFAZOLIN (URINE) Value in next row Sensitive      8 SENSITIVEThis is a modified FDA-approved test that has been validated and its performance characteristics determined by the reporting laboratory.  This laboratory is certified under the Clinical Laboratory Improvement Amendments CLIA as qualified to perform high complexity clinical laboratory testing.     CEFEPIME  Value in next row Sensitive      8 SENSITIVEThis is a modified FDA-approved test that has been validated and its performance characteristics determined by the reporting laboratory.  This laboratory is certified under the Clinical Laboratory Improvement Amendments CLIA as qualified to perform high complexity clinical laboratory testing.    ERTAPENEM Value in next row Sensitive      8 SENSITIVEThis is a modified FDA-approved test that has been validated and its performance characteristics determined by the reporting laboratory.  This laboratory is certified under the Clinical Laboratory Improvement Amendments CLIA as qualified to perform high complexity clinical laboratory testing.    CEFTRIAXONE  Value in next row Sensitive      8 SENSITIVEThis is a modified FDA-approved test that has been validated and its performance characteristics determined by the reporting laboratory.  This laboratory is certified under the Clinical Laboratory Improvement Amendments CLIA as qualified to perform high complexity clinical laboratory testing.    CIPROFLOXACIN  Value in next row Sensitive      8 SENSITIVEThis is a modified FDA-approved test that has been validated and its performance characteristics determined by the reporting laboratory.  This laboratory is certified under the Clinical Laboratory Improvement Amendments CLIA as qualified to perform high complexity clinical laboratory testing.    GENTAMICIN Value in next row Sensitive      8 SENSITIVEThis is a modified FDA-approved test that has been validated and its performance characteristics determined by the reporting laboratory.  This laboratory is certified under the Clinical Laboratory Improvement Amendments CLIA as qualified to perform high complexity clinical laboratory testing.    NITROFURANTOIN Value in next row Sensitive      8 SENSITIVEThis is a modified FDA-approved test that has been validated and its performance characteristics determined by the  reporting laboratory.  This laboratory is certified under the Clinical Laboratory Improvement Amendments CLIA as qualified to  perform high complexity clinical laboratory testing.    TRIMETH /SULFA  Value in next row Sensitive      8 SENSITIVEThis is a modified FDA-approved test that has been validated and its performance characteristics determined by the reporting laboratory.  This laboratory is certified under the Clinical Laboratory Improvement Amendments CLIA as qualified to perform high complexity clinical laboratory testing.    AMPICILLIN/SULBACTAM Value in next row Intermediate      8 SENSITIVEThis is a modified FDA-approved test that has been validated and its performance characteristics determined by the reporting laboratory.  This laboratory is certified under the Clinical Laboratory Improvement Amendments CLIA as qualified to perform high complexity clinical laboratory testing.    PIP/TAZO Value in next row Sensitive      <=4 SENSITIVEThis is a modified FDA-approved test that has been validated and its performance characteristics determined by the reporting laboratory.  This laboratory is certified under the Clinical Laboratory Improvement Amendments CLIA as qualified to perform high complexity clinical laboratory testing.    MEROPENEM Value in next row Sensitive      <=4 SENSITIVEThis is a modified FDA-approved test that has been validated and its performance characteristics determined by the reporting laboratory.  This laboratory is certified under the Clinical Laboratory Improvement Amendments CLIA as qualified to perform high complexity clinical laboratory testing.    * 50,000 COLONIES/mL ESCHERICHIA COLI  Blood Culture (routine x 2)     Status: Abnormal (Preliminary result)   Collection Time: 04/28/24  1:46 PM   Specimen: BLOOD LEFT WRIST  Result Value Ref Range Status   Specimen Description   Final    BLOOD LEFT WRIST Performed at [facility] [place], [address] [place], [place]  [address]    Special Requests   Final    BOTTLES DRAWN AEROBIC AND ANAEROBIC Blood Culture adequate volume Performed at [facility], [address] [place], [place] [address]    Culture  Setup Time (A)  Final    YEAST AEROBIC BOTTLE ONLY CRITICAL RESULT CALLED TO, READ BACK BY AND VERIFIED WITH: NATHAN BELUE @ 04/30/2024 0216 AB    Culture (A)  Final    CANDIDA ALBICANS Sent to [facility] for further susceptibility testing. Performed at [facility], [address], [place], [place] [address]    Report Status PENDING  Incomplete  Blood Culture ID Panel (Reflexed)     Status: Abnormal   Collection Time: 04/28/24  1:46 PM  Result Value Ref Range Status   Enterococcus faecalis NOT DETECTED NOT DETECTED Final   Enterococcus Faecium NOT DETECTED NOT DETECTED Final   Listeria monocytogenes NOT DETECTED NOT DETECTED Final   Staphylococcus species NOT DETECTED NOT DETECTED Final   Staphylococcus aureus (BCID) NOT DETECTED NOT DETECTED Final   Staphylococcus epidermidis NOT DETECTED NOT DETECTED Final   Staphylococcus lugdunensis NOT DETECTED NOT DETECTED Final   Streptococcus species NOT DETECTED NOT DETECTED Final   Streptococcus agalactiae NOT DETECTED NOT DETECTED Final   Streptococcus pneumoniae NOT DETECTED NOT DETECTED Final   Streptococcus pyogenes NOT DETECTED NOT DETECTED Final   A.calcoaceticus-baumannii NOT DETECTED NOT DETECTED Final   Bacteroides fragilis NOT DETECTED NOT DETECTED Final   Enterobacterales NOT DETECTED NOT DETECTED Final   Enterobacter cloacae complex NOT DETECTED NOT DETECTED Final   Escherichia coli NOT DETECTED NOT DETECTED Final   Klebsiella aerogenes NOT DETECTED NOT DETECTED Final   Klebsiella oxytoca NOT DETECTED NOT DETECTED Final   Klebsiella pneumoniae NOT DETECTED NOT DETECTED Final   Proteus  species NOT DETECTED NOT DETECTED Final   Salmonella species NOT DETECTED NOT DETECTED Final   Serratia marcescens NOT DETECTED NOT DETECTED  Final   Haemophilus influenzae NOT DETECTED NOT DETECTED Final   Neisseria meningitidis NOT DETECTED NOT DETECTED Final   Pseudomonas aeruginosa NOT DETECTED NOT DETECTED Final   Stenotrophomonas maltophilia NOT DETECTED NOT DETECTED Final   Candida albicans DETECTED (A) NOT DETECTED Final    Comment: CRITICAL RESULT CALLED TO, READ BACK BY AND VERIFIED WITH: NATHAN BELUE @ 04/30/2024 0216 AB    Candida auris NOT DETECTED NOT DETECTED Final   Candida glabrata DETECTED (A) NOT DETECTED Final    Comment: CRITICAL RESULT CALLED TO, READ BACK BY AND VERIFIED WITH: NATHAN BELUE @ 04/30/2024 0216 AB    Candida krusei NOT DETECTED NOT DETECTED Final   Candida parapsilosis NOT DETECTED NOT DETECTED Final   Candida tropicalis NOT DETECTED NOT DETECTED Final   Cryptococcus neoformans/gattii NOT DETECTED NOT DETECTED Final    Comment: Performed at [facility], [address] [place], [place] [address]  Yeast Susceptibilities     Status: None   Collection Time: 04/28/24  1:46 PM  Result Value Ref Range Status   SOURCE BLOOD CANDIDA ALBICANS  Final    Comment: Performed at [facility], [address] [place], [place] [address]   Organism ID, Yeast Candida albicans  Corrected    Comment: (NOTE) Identification performed by account, not confirmed by this laboratory. CORRECTED ON 12/21 AT 1235: PREVIOUSLY REPORTED AS Preliminary report    Amphotericin B MIC 0.5 ug/mL  Final    Comment: (NOTE) Breakpoints have been established for only some organism-drug combinations as indicated. This test was developed and its performance characteristics determined by [facility]. It has not been cleared or approved by the Food and Drug Administration.    Anidulafungin MIC Comment  Final    Comment: (NOTE) 0.03 ug/mL Susceptible Breakpoints have been established for only some organism-drug combinations as indicated. This test was developed and its performance characteristics determined by  [facility]. It has not been cleared or approved by the Food and Drug Administration.    Caspofungin MIC Comment  Final    Comment: (NOTE) 0.12 ug/mL Susceptible Breakpoints have been established for only some organism-drug combinations as indicated. This test was developed and its performance characteristics determined by [facility]. It has not been cleared or approved by the Food and Drug Administration.    Fluconazole  Islt MIC 0.5 ug/mL Susceptible  Final    Comment: (NOTE) Breakpoints have been established for only some organism-drug combinations as indicated. This test was developed and its performance characteristics determined by [facility]. It has not been cleared or approved by the Food and Drug Administration.    ISAVUCONAZOLE MIC 0.008 ug/mL  Final    Comment: (NOTE) This test was developed and its performance characteristics determined by [facility]. It has not been cleared or approved by the Food and Drug Administration.    Itraconazole MIC 0.06 ug/mL  Final    Comment: (NOTE) Breakpoints have been established for only some organism-drug combinations as indicated. This test was developed and its performance characteristics determined by [facility]. It has not been cleared or approved by the Food and Drug Administration.    Micafungin  MIC Comment  Final    Comment: (NOTE) 0.015 ug/mL Susceptible Breakpoints have been established for only some organism-drug combinations as indicated. This test was developed and its performance characteristics determined by [facility]. It has not been cleared or approved by  the Food and Drug Administration.    Posaconazole MIC 0.03 ug/mL  Final    Comment: (NOTE) Breakpoints have been established for only some organism-drug combinations as indicated. This test was developed and its performance characteristics determined by [facility]. It has not been cleared or approved by the Food and Drug Administration.    REZAFUNGIN MIC Comment  Final     Comment: (NOTE) 0.12 ug/mL Susceptible This test was developed and its performance characteristics determined by [facility]. It has not been cleared or approved by the Food and Drug Administration.    Voriconazole MIC Comment  Final    Comment: (NOTE) 0.015 ug/mL Susceptible Breakpoints have been established for only some organism-drug combinations as indicated. This test was developed and its performance characteristics determined by [facility]. It has not been cleared or approved by the Food and Drug Administration. Performed At: [facility] ([facility]) [address] [place], [place] [id number] Jennette Shorter MD Ey:[id number]   Culture, blood (Routine X 2) w Reflex to ID Panel     Status: None   Collection Time: 05/01/24  3:16 PM   Specimen: BLOOD  Result Value Ref Range Status   Specimen Description BLOOD BLOOD LEFT ARM  Final   Special Requests   Final    BOTTLES DRAWN AEROBIC AND ANAEROBIC Blood Culture adequate volume   Culture   Final    NO GROWTH 5 DAYS Performed at [facility], [address], [place], [place] [address]    Report Status 05/06/2024 FINAL  Final  Culture, blood (Routine X 2) w Reflex to ID Panel     Status: None   Collection Time: 05/01/24  3:17 PM   Specimen: BLOOD  Result Value Ref Range Status   Specimen Description BLOOD BLOOD LEFT HAND  Final   Special Requests AEROBIC BOTTLE ONLY Blood Culture adequate volume  Final   Culture   Final    NO GROWTH 5 DAYS Performed at [facility], [address] [place], [place] [address]    Report Status 05/06/2024 FINAL  Final    Radiologic Imaging: CT ANGIO GI BLEED Result Date: 05/14/2024 EXAM: CTA ABDOMEN AND PELVIS WITH CONTRAST 05/14/2024 02:37:31 PM TECHNIQUE: CTA images of the abdomen and pelvis with 75 mL of iohexol  (OMNIPAQUE ) 350 MG/ML intravenous contrast. Three-dimensional MIP/volume rendered formations were performed. Automated exposure control, iterative reconstruction, and/or  weight based adjustment of the mA/kV was utilized to reduce the radiation dose to as low as reasonably achievable. COMPARISON: CT lumbar spine 12/125, CT abdomen and pelvis 04/28/24 CLINICAL HISTORY: lower GI bleeding, eval active extrav. slow oozing of dark red per rectum on exam FINDINGS: VASCULATURE: GI BLEED: No extravasation of intravenous contrast within the lumen of the bowel. AORTA: No acute finding. No abdominal aortic aneurysm. No dissection. CELIAC TRUNK: No acute finding. No occlusion or significant stenosis. SUPERIOR MESENTERIC ARTERY: No acute finding. No occlusion or significant stenosis. INFERIOR MESENTERIC ARTERY: No acute finding. No occlusion or significant stenosis. RENAL ARTERIES: No acute finding. No occlusion or significant stenosis. ILIAC ARTERIES: No acute finding. No occlusion or significant stenosis. ABDOMEN/PELVIS: LOWER CHEST: Visualized portion of the lower chest demonstrates no acute abnormality. LIVER: The liver is unremarkable. GALLBLADDER AND BILE DUCTS: Gallbladder not visualized and likely surgically absent. No biliary ductal dilatation. SPLEEN: The spleen is unremarkable. PANCREAS: The pancreas is unremarkable. ADRENAL GLANDS: Bilateral adrenal glands demonstrate no acute abnormality. KIDNEYS, URETERS AND BLADDER: Marked urothelial thickening of the left renal pelvis. Associated left renal-sided nephrogram. Several lobulated fluid density  lesions along the left lower renal pole measuring up to 3 cm ([phone]) that may represent developing renal cortical abscesses versus cysts. The right kidney enhances homogeneously. No nephroureterolithiasis. No hydroureteronephrosis. No perinephric or periureteral stranding. Urinary bladder is distended with urine. GI AND BOWEL: Stomach and duodenal sweep demonstrate no acute abnormality. No extravasation of intravenous contrast within the lumen of the bowel. Normal medialized cecum. No small or large bowel thickening or dilatation. The  appendix is not definitely identified with no inflammatory changes in the right lower quadrant to suggest acute appendicitis. Underdistended rectum with question rectal wall thickening with mild perirectal fat stranding. Stool throughout the ascending and transverse colon. There is no bowel obstruction. No abnormal bowel wall thickening or distension. REPRODUCTIVE: Reproductive organs are unremarkable. PERITONEUM AND RETROPERITONEUM: No ascites or free air. LYMPH NODES: Prominent but nonenlarged perirectal lymph node (4:69). Chronic benign enlarged left aortic retroperitoneal lymph nodes. No lymphadenopathy. BONES AND SOFT TISSUES: Mitral annular calcification. Aortic valve replacement. Coronary artery calcifications. No acute abnormality of the bones. No acute soft tissue abnormality. IMPRESSION: 1. No CT evidence of active gastrointestinal hemorrhage. 2. Left nephrolithiasis with fluid density lesions that may represent developing left lower renal poll cortical abscesses. 3. Question proctitis with limited evaluation due to rectal under-distension. Recommend colonoscopy status post treatment and status post complete resolution of inflammatory changes to exclude an underlying lesion. 4. Other, non-acute and/or normal findings as above. Electronically signed by: Morgane Naveau MD 05/14/2024 04:24 PM EST RP Workstation: [id number]   Assessment & Plan:  83 y.o. comorbid female with a recent history of sepsis due to left pyelonephritis now admitted with rectal bleeding due to possible proctitis with incidental findings of small left lower pole renal abscesses.  Also with an incidental distended urinary bladder.  She is clinically stable, no signs of sepsis.  Recommend continued conservative management with IV antibiotics.  If she clinically declines, would recommend to repeat imaging to reevaluate abscess size for consideration of drain placement.  Recommendations: - Continue IV antibiotics and transition to  p.o. antibiotics on discharge.  Recommend a total of 2 weeks of antibiotic therapy. - Obtain CT abdomen with contrast if she spikes fevers or clinically declines for consideration of percutaneous abscess drainage with IR - Inpatient voiding trial prior to discharge  Thank you for involving me in this patient's care, please page with any further questions or concerns.  Patrecia Veiga, PA-C 05/15/2024 12:20 PM       [1]  Current Meds  Medication Sig   amLODipine  (NORVASC ) 2.5 MG tablet Take 1 tablet (2.5 mg total) by mouth daily.   aspirin  EC 81 MG tablet Take 81 mg by mouth daily.   atorvastatin  (LIPITOR ) 40 MG tablet TAKE 1 TABLET BY MOUTH EVERY DAY   clopidogrel  (PLAVIX ) 75 MG tablet TAKE 1 TABLET BY MOUTH EVERY DAY   DULoxetine  (CYMBALTA ) 30 MG capsule TAKE 1 CAPSULE BY MOUTH EVERY DAY   empagliflozin  (JARDIANCE ) 25 MG TABS tablet Take 1 tablet (25 mg total) by mouth daily before breakfast.   insulin  glargine (LANTUS  SOLOSTAR) 100 UNIT/ML Solostar Pen Inject 40 Units into the skin daily. TAKE 40 UNITS DAILY UNDER SKIN   meclizine  (ANTIVERT ) 12.5 MG tablet Take 1-2 tablets (12.5-25 mg total) by mouth 3 (three) times daily as needed for dizziness. (Patient taking differently: Take 12.5 mg by mouth 3 (three) times daily as needed for dizziness.)   metFORMIN  (GLUCOPHAGE -XR) 500 MG 24 hr tablet TAKE 2 TABLETS (1,000 MG TOTAL) BY MOUTH IN  THE MORNING AND AT BEDTIME.   nitroGLYCERIN  (NITROSTAT ) 0.4 MG SL tablet Place 1 tablet (0.4 mg total) under the tongue every 5 (five) minutes as needed for chest pain.   nortriptyline  (PAMELOR ) 50 MG capsule Take 3 capsules (150 mg total) by mouth at bedtime.   oxyCODONE -acetaminophen  (PERCOCET) 10-325 MG tablet Take 1 tablet by mouth every 6 (six) hours as needed for pain.   polyethylene glycol (MIRALAX  / GLYCOLAX ) packet Take 17 g by mouth daily as needed.    senna (SENOKOT) 8.6 MG TABS tablet Take 1 tablet by mouth every evening.  [2] No Known  Allergies  "

## 2024-05-15 NOTE — Progress Notes (Signed)
" °  Progress Note   Patient: Cynthia Singh FMW:[id number] DOB: 12-12-1940 DOA: 05/14/2024     1 DOS: the patient was seen and examined on 05/15/2024   Brief hospital course: Cynthia Singh is a 83 y.o. female with medical history significant of CAD, MI, PVD, HTN, HLD, DM, dCHF, GERD, depression, CKD 3B, vertigo, left bundle blockade, chronic back pain with narcotic dependency, who presents with left flank pain and rectal bleeding.  Patient had CT angio GI bleed scan, showed likely renal abscess, possible proctitis. Patient was placed on Zosyn , blood culture was sent out.    Principal Problem:   Renal abscess, left Active Problems:   Rectal bleeding   Proctitis   HTN (hypertension)   CAD (coronary artery disease)   HLD (hyperlipidemia)   Chronic diastolic CHF (congestive heart failure) (HCC)   Type II diabetes mellitus with renal manifestations (HCC)   Chronic kidney disease, stage 3b (HCC)   Chronic narcotic dependence (HCC)   Depression   Assessment and Plan: Left renal abscess. Patient does not have abscess.  She was recently in the hospital treated for E. coli urinary tract infection with bacteremia.  Presumed to be the same bacteria causing her renal abscess.  Based on prior culture, Rocephin  is a good choice.  Blood culture also sent out, will pending results. Patient can be discharged home once culture results available. Patient also seen by urology, no surgery required.  Recommended follow-up with urology as outpatient.  Rectal bleeding with possible proctitis  Bleeding seems to be better today, CT angio did not show any active bleeding.  Patient placed on Zosyn  to treat both renal abscess and proctitis.  Will continue  Essential hypertension  Current home medicines.  Coronary artery disease  Chronic diastolic congestive heart failure. No exacerbation.  Chronic kidney disease stage IIIb. Renal function still stable.      Subjective:  Patient doing better  today, no fever or chills.  No nausea vomiting.  Physical Exam: Vitals:   05/14/24 2159 05/15/24 0401 05/15/24 0731 05/15/24 1149  BP: (!) 159/66 (!) 164/65 (!) 139/59 (!) 137/55  Pulse: 75 74 70 76  Resp: 15 18 14 20   Temp: 97.6 F (36.4 C) (!) 97.5 F (36.4 C) (!) 97.5 F (36.4 C)   TempSrc: Oral  Oral   SpO2: 97% 98% 97% 98%  Weight:      Height:       General exam: Appears calm and comfortable  Respiratory system: Clear to auscultation. Respiratory effort normal. Cardiovascular system: S1 & S2 heard, RRR. No JVD, murmurs, rubs, gallops or clicks. No pedal edema. Gastrointestinal system: Abdomen is nondistended, soft and nontender. No organomegaly or masses felt. Normal bowel sounds heard. Central nervous system: Alert and oriented. No focal neurological deficits. Extremities: Symmetric 5 x 5 power. Skin: No rashes, lesions or ulcers Psychiatry: Judgement and insight appear normal. Mood & affect appropriate.    Data Reviewed:  CT scan and lab results reviewed  Family Communication: None  Disposition: Status is: Inpatient Remains inpatient appropriate because: Severity of disease, IV treatment.     Time spent: 35 minutes  Author: Murvin Mana, MD 05/15/2024 1:05 PM  For on call review [email or web address].    "

## 2024-05-15 NOTE — NC FL2 (Signed)
 " [place]  MEDICAID FL2 LEVEL OF CARE FORM     IDENTIFICATION  Patient Name: Cynthia Singh Birthdate: 11/19/1940 Sex: female Admission Date (Current Location): 05/14/2024  [facility] and [place] Number:  Chiropodist and Address:  [facility], [address] [place], [place] [address]      Provider Number: [id number]  Attending Physician Name and Address:  Laurita Pillion, MD  Relative Name and Phone Number:  Elicia, Lui [phone]    Current Level of Care: Hospital Recommended Level of Care: Skilled Nursing Facility Prior Approval Number:    Date Approved/Denied:   PASRR Number: [id number]  Discharge Plan: SNF    Current Diagnoses: Patient Active Problem List   Diagnosis Date Noted   Renal abscess, left 05/14/2024   Rectal bleeding 05/14/2024   HLD (hyperlipidemia) 05/14/2024   Type II diabetes mellitus with renal manifestations (HCC) 05/14/2024   Chronic kidney disease, stage 3b (HCC) 05/14/2024   Depression 05/14/2024   Proctitis 05/14/2024   Chronic diastolic CHF (congestive heart failure) (HCC) 05/14/2024   HTN (hypertension)    Sepsis due to gram-negative UTI (HCC) 04/28/2024   Left shoulder pain 02/16/2024   Stage 3a chronic kidney disease (HCC) 11/16/2023   Diabetes mellitus treated with insulin  and oral medication (HCC) 05/10/2023   Vertigo 01/05/2023   GERD (gastroesophageal reflux disease) 07/14/2021   Peripheral vascular disease, unspecified 2020   Chronic narcotic dependence (HCC) 10/29/2016   CAD (coronary artery disease) 07/19/2016   Preventative health care 04/30/2014   Advanced directives, counseling/discussion 04/30/2014   Atherosclerotic heart disease of native coronary artery with angina pectoris 10/21/2012   KNEE PAIN, LEFT, CHRONIC 02/25/2010   Type 2 diabetes mellitus (HCC) 12/30/2009   Hyperlipidemia associated with type 2 diabetes mellitus (HCC) 12/30/2009   Mood disorder 12/30/2009    Hypertension associated with type 2 diabetes mellitus (HCC) 12/30/2009   Chronic back pain 12/30/2009    Orientation RESPIRATION BLADDER Height & Weight     Self, Time, Situation, Place  Normal Incontinent Weight: 78.4 kg Height:  5' 8 (172.7 cm)  BEHAVIORAL SYMPTOMS/MOOD NEUROLOGICAL BOWEL NUTRITION STATUS      Incontinent Diet  AMBULATORY STATUS COMMUNICATION OF NEEDS Skin   Extensive Assist Verbally Normal                       Personal Care Assistance Level of Assistance  Bathing, Feeding, Dressing Bathing Assistance: Maximum assistance Feeding assistance: Limited assistance Dressing Assistance: Maximum assistance     Functional Limitations Info  Sight, Hearing, Speech Sight Info: Impaired Hearing Info: Adequate Speech Info: Adequate    SPECIAL CARE FACTORS FREQUENCY  PT (By licensed PT), OT (By licensed OT)     PT Frequency: 5 times per week OT Frequency: 5 times per week            Contractures Contractures Info: Not present    Additional Factors Info  Code Status, Allergies Code Status Info: Full Allergies Info: NKA           Current Medications (05/15/2024):  This is the current hospital active medication list Current Facility-Administered Medications  Medication Dose Route Frequency Provider Last Rate Last Admin   acetaminophen  (TYLENOL ) tablet 650 mg  650 mg Oral Q6H PRN Niu, Xilin, MD       amLODipine  (NORVASC ) tablet 2.5 mg  2.5 mg Oral Daily Niu, Xilin, MD   2.5 mg at 05/15/24 1038   atorvastatin  (LIPITOR ) tablet 40 mg  40  mg Oral Daily Niu, Xilin, MD   40 mg at 05/15/24 1038   Chlorhexidine  Gluconate Cloth 2 % PADS 6 each  6 each Topical Daily Niu, Xilin, MD   6 each at 05/15/24 1038   DULoxetine  (CYMBALTA ) DR capsule 30 mg  30 mg Oral Daily Niu, Xilin, MD   30 mg at 05/15/24 1037   feeding supplement (ENSURE PLUS HIGH PROTEIN) liquid 237 mL  237 mL Oral BID BM Zhang, Dekui, MD   237 mL at 05/15/24 1448   hydrALAZINE  (APRESOLINE )  injection 5 mg  5 mg Intravenous Q2H PRN Niu, Xilin, MD       insulin  aspart (novoLOG ) injection 0-5 Units  0-5 Units Subcutaneous QHS Niu, Xilin, MD       insulin  aspart (novoLOG ) injection 0-9 Units  0-9 Units Subcutaneous TID WC Niu, Xilin, MD       insulin  glargine (LANTUS ) injection 20 Units  20 Units Subcutaneous Daily Niu, Xilin, MD       magnesium  citrate solution 1 Bottle  1 Bottle Oral Once Russo, Steven Michael, DO       meclizine  (ANTIVERT ) tablet 12.5 mg  12.5 mg Oral TID PRN Niu, Xilin, MD       nitroGLYCERIN  (NITROSTAT ) SL tablet 0.4 mg  0.4 mg Sublingual Q5 min PRN Niu, Xilin, MD       nortriptyline  (PAMELOR ) capsule 150 mg  150 mg Oral QHS Niu, Xilin, MD       ondansetron  (ZOFRAN ) injection 4 mg  4 mg Intravenous Q8H PRN Niu, Xilin, MD       oxyCODONE -acetaminophen  (PERCOCET/ROXICET) 5-325 MG per tablet 2 tablet  2 tablet Oral Q6H PRN Niu, Xilin, MD   2 tablet at 05/15/24 1452   pantoprazole  (PROTONIX ) injection 40 mg  40 mg Intravenous Q12H Niu, Xilin, MD   40 mg at 05/15/24 1037   piperacillin -tazobactam (ZOSYN ) IVPB 3.375 g  3.375 g Intravenous Q8H Niu, Xilin, MD 12.5 mL/hr at 05/15/24 1448 3.375 g at 05/15/24 1448   polyethylene glycol (MIRALAX  / GLYCOLAX ) packet 17 g  17 g Oral BID Russo, Steven Michael, DO         Discharge Medications: Please see discharge summary for a list of discharge medications.  Relevant Imaging Results:  Relevant Lab Results:   Additional Information ss# [id number]  Nathanael CHRISTELLA Ring, RN     "

## 2024-05-15 NOTE — Consult Note (Addendum)
 "   GI Inpatient Consult Note  Reason for Consult: Rectal Bleeding   Attending Requesting Consult: Dr Caleb Exon  History of Present Illness: Cynthia Singh is a 83 y.o. female seen for evaluation of rectal bleeding at the request of Dr Caleb Exon. Patient states she has a longstanding history of constipation. Most recently, she went about 1 week without having a bowel movement. They gave her stool softeners and laxatives at her facility, but she in unsure of any specifics. She states she was finally able to have a bowel movement yesterday which was bloody in appearance, prompting evaluation at the ED. She states it was a large volume of loose, malodorous, and visibly bloody stool in her brief. She does not recall having any episodes like this previously. She states that during this same timeframe, she had decreased appetite and generalized abdominal pain that improved after having a bowel movement. She reports having one small bowel movement this morning. She denies fever, chills, chest pain, shortness of breath, nausea, vomiting, or severe abdominal pain.  Per ED workup:  CT-GI bleed: 1. No CT evidence of active gastrointestinal hemorrhage. 2. Left nephrolithiasis with fluid density lesions that may represent developing left lower renal poll cortical abscesses. 3. Question proctitis with limited evaluation due to rectal under-distension. Recommend colonoscopy status post treatment and status post complete resolution of inflammatory changes to exclude an underlying lesion. 4. Other, non-acute and/or normal findings as above. Hgb 10.2 (down from 10.9 on 12/18) Hct 32.2  WBC 13.4, platelets 445  Per Internal Medicine: Rectal bleeding and possible proctitis: Hgb 10.9 --> 10.2.  CT of GI bleed is negative for active bleeding, it showed possible proctitis. - Patient was on Zosyn  as above - hold ASA and plavix  - IVF: 1L NS bolus - Start IV pantoprazole  40 mg bid - Zofran  IV for nausea -  Avoid NSAIDs and SQ heparin  - Maintain IV access (2 large bore IVs if possible). - Monitor closely and follow q6h cbc, transfuse as necessary, if Hgb<7.0 - LaB: INR, PTT and type screen - Epic message will be sent to Dr. Maryruth for GI consult at 6 AM.   Last Colonoscopy: patient unsure and unable to find on chart review  Past Medical History:  Past Medical History:  Diagnosis Date   CAD (coronary artery disease)    a. Remote nonobstructive disease but in 10/2012 Cath/PCI: s/p DES to RCA. b. cath 02/08/15 DES to prox LAD and balloon angioplasty of ost D1, EF 55-65%; c. 08/2019 PCI/DES prox/distal RCA (overlapping prev RCA stent). Prev placed RCA/LAD stents patent.   Concussion    SAH AFTER FALL 3/18   Depression    Diabetes mellitus, type 2 (HCC)    Dyslipidemia    Episodic mood disorder    HTN (hypertension)    Hypertension    Myocardial infarction (HCC)    2014   Obesity    Osteoarthritis of spine    knees also   Peripheral vascular disease, unspecified 2020   severe disease on home screening by insurance    Problem List: Patient Active Problem List   Diagnosis Date Noted   Renal abscess, left 05/14/2024   Rectal bleeding 05/14/2024   HLD (hyperlipidemia) 05/14/2024   Type II diabetes mellitus with renal manifestations (HCC) 05/14/2024   Chronic kidney disease, stage 3b (HCC) 05/14/2024   Depression 05/14/2024   Proctitis 05/14/2024   Chronic diastolic CHF (congestive heart failure) (HCC) 05/14/2024   HTN (hypertension)    Sepsis due  to gram-negative UTI (HCC) 04/28/2024   Left shoulder pain 02/16/2024   Stage 3a chronic kidney disease (HCC) 11/16/2023   Diabetes mellitus treated with insulin  and oral medication (HCC) 05/10/2023   Vertigo 01/05/2023   GERD (gastroesophageal reflux disease) 07/14/2021   Peripheral vascular disease, unspecified 2020   Chronic narcotic dependence (HCC) 10/29/2016   CAD (coronary artery disease) 07/19/2016   Preventative health care  04/30/2014   Advanced directives, counseling/discussion 04/30/2014   Atherosclerotic heart disease of native coronary artery with angina pectoris 10/21/2012   KNEE PAIN, LEFT, CHRONIC 02/25/2010   Type 2 diabetes mellitus (HCC) 12/30/2009   Hyperlipidemia associated with type 2 diabetes mellitus (HCC) 12/30/2009   Mood disorder 12/30/2009   Hypertension associated with type 2 diabetes mellitus (HCC) 12/30/2009   Chronic back pain 12/30/2009    Past Surgical History: Past Surgical History:  Procedure Laterality Date   ABDOMINAL HYSTERECTOMY     APPENDECTOMY  1959   CARDIAC CATHETERIZATION N/A 02/08/2015   Procedure: Left Heart Cath and Coronary Angiography;  Surgeon: Ozell Fell, MD;  Location: [facility] INVASIVE CV LAB;  Service: Cardiovascular;  Laterality: N/A;   CARDIAC CATHETERIZATION N/A 02/08/2015   Procedure: Coronary Stent Intervention;  Surgeon: Ozell Fell, MD;  Location: [facility] INVASIVE CV LAB;  Service: Cardiovascular;  Laterality: N/A;   CARDIAC CATHETERIZATION N/A 02/08/2015   Procedure: Intravascular Pressure Wire/FFR Study;  Surgeon: Ozell Fell, MD;  Location: [facility] INVASIVE CV LAB;  Service: Cardiovascular;  Laterality: N/A;   CATARACT EXTRACTION W/PHACO Left 11/10/2016   Procedure: CATARACT EXTRACTION PHACO AND INTRAOCULAR LENS PLACEMENT (IOC) suture placed in left eye at end of procedure;  Surgeon: Jaye Fallow, MD;  Location: [facility] ORS;  Service: Ophthalmology;  Laterality: Left;  US   3.20 AP% 27.7 CDE 55.63 Fluid pack lot # [id number]   CHOLECYSTECTOMY     COMBINED HYSTERECTOMY ABDOMINAL W/ A&P REPAIR / OOPHORECTOMY  1968   CORONARY ANGIOPLASTY     STENTS X 5   CORONARY STENT INTERVENTION N/A 09/19/2019   Procedure: CORONARY STENT INTERVENTION;  Surgeon: Mady Bruckner, MD;  Location: [facility] INVASIVE CV LAB;  Service: Cardiovascular;  Laterality: N/A;   CORONARY STENT PLACEMENT  2014   DILATION AND CURETTAGE OF UTERUS     GALLBLADDER SURGERY  1968   LEFT HEART CATH AND  CORONARY ANGIOGRAPHY Left 09/19/2019   Procedure: LEFT HEART CATH AND CORONARY ANGIOGRAPHY;  Surgeon: Mady Bruckner, MD;  Location: [facility] INVASIVE CV LAB;  Service: Cardiovascular;  Laterality: Left;   LEFT HEART CATHETERIZATION WITH CORONARY ANGIOGRAM N/A 10/20/2012   Procedure: LEFT HEART CATHETERIZATION WITH CORONARY ANGIOGRAM;  Surgeon: Ozell Fell, MD;  Location: [facility] CATH LAB;  Service: Cardiovascular;  Laterality: N/A;   multiple D&C     TONSILLECTOMY  age 79    Allergies: Allergies[1]  Home Medications: Medications Prior to Admission  Medication Sig Dispense Refill Last Dose/Taking   amLODipine  (NORVASC ) 2.5 MG tablet Take 1 tablet (2.5 mg total) by mouth daily. 90 tablet 1 05/14/2024   aspirin  EC 81 MG tablet Take 81 mg by mouth daily.   05/14/2024 at 10:11 AM   atorvastatin  (LIPITOR ) 40 MG tablet TAKE 1 TABLET BY MOUTH EVERY DAY 90 tablet 3 05/13/2024 at  9:33 PM   clopidogrel  (PLAVIX ) 75 MG tablet TAKE 1 TABLET BY MOUTH EVERY DAY 90 tablet 3 05/14/2024 at 10:11 AM   DULoxetine  (CYMBALTA ) 30 MG capsule TAKE 1 CAPSULE BY MOUTH EVERY DAY 90 capsule 1 05/14/2024 at 10:11 AM   empagliflozin  (JARDIANCE ) 25 MG  TABS tablet Take 1 tablet (25 mg total) by mouth daily before breakfast. 30 tablet 11 05/14/2024 at  5:03 AM   insulin  glargine (LANTUS  SOLOSTAR) 100 UNIT/ML Solostar Pen Inject 40 Units into the skin daily. TAKE 40 UNITS DAILY UNDER SKIN 15 mL 11 05/13/2024 at  9:51 AM   meclizine  (ANTIVERT ) 12.5 MG tablet Take 1-2 tablets (12.5-25 mg total) by mouth 3 (three) times daily as needed for dizziness. (Patient taking differently: Take 12.5 mg by mouth 3 (three) times daily as needed for dizziness.) 90 tablet 3 05/13/2024 at  9:51 AM   metFORMIN  (GLUCOPHAGE -XR) 500 MG 24 hr tablet TAKE 2 TABLETS (1,000 MG TOTAL) BY MOUTH IN THE MORNING AND AT BEDTIME. 360 tablet 1 05/14/2024 at 10:11 AM   nitroGLYCERIN  (NITROSTAT ) 0.4 MG SL tablet Place 1 tablet (0.4 mg total) under the tongue every 5 (five)  minutes as needed for chest pain. 30 tablet 0 Unknown   nortriptyline  (PAMELOR ) 50 MG capsule Take 3 capsules (150 mg total) by mouth at bedtime. 270 capsule 3 05/13/2024 at  9:33 PM   oxyCODONE -acetaminophen  (PERCOCET) 10-325 MG tablet Take 1 tablet by mouth every 6 (six) hours as needed for pain.   05/13/2024 at  6:58 PM   polyethylene glycol (MIRALAX  / GLYCOLAX ) packet Take 17 g by mouth daily as needed.    05/13/2024 at 11:21 AM   senna (SENOKOT) 8.6 MG TABS tablet Take 1 tablet by mouth every evening.   05/13/2024 at  9:33 PM   Insulin  Pen Needle (PEN NEEDLES) 32G X 5 MM MISC 1 Units by Does not apply route daily. 100 each 3    Home medication reconciliation was completed with the patient.   Scheduled Inpatient Medications:    amLODipine   2.5 mg Oral Daily   atorvastatin   40 mg Oral Daily   Chlorhexidine  Gluconate Cloth  6 each Topical Daily   DULoxetine   30 mg Oral Daily   insulin  aspart  0-5 Units Subcutaneous QHS   insulin  aspart  0-9 Units Subcutaneous TID WC   insulin  glargine  20 Units Subcutaneous Daily   nortriptyline   150 mg Oral QHS   pantoprazole  (PROTONIX ) IV  40 mg Intravenous Q12H    Continuous Inpatient Infusions:    piperacillin -tazobactam (ZOSYN )  IV 3.375 g (05/15/24 0546)    PRN Inpatient Medications:  acetaminophen , hydrALAZINE , meclizine , nitroGLYCERIN , ondansetron  (ZOFRAN ) IV, oxyCODONE -acetaminophen , polyethylene glycol  Family History: family history includes Cancer in her brother and son. She was adopted.  The patient's family history is negative for inflammatory bowel disorders, GI malignancy, or solid organ transplantation.  Social History:   reports that she has never smoked. She has been exposed to tobacco smoke. She has never used smokeless tobacco. She reports that she does not drink alcohol and does not use drugs. The patient denies ETOH, tobacco, or drug use.   Review of Systems: Constitutional: Weight is stable.  Eyes: No changes in  vision. ENT: No oral lesions, sore throat.  GI: see HPI.  Heme/Lymph: No easy bruising.  CV: No chest pain.  GU: No hematuria.  Integumentary: No rashes.  Neuro: No headaches.  Psych: No depression/anxiety.  Endocrine: No heat/cold intolerance.  Allergic/Immunologic: No urticaria.  Resp: No cough, SOB.  Musculoskeletal: No joint swelling.    Physical Examination: BP (!) 137/55 (BP Location: Right Arm)   Pulse 76   Temp (!) 97.5 F (36.4 C) (Oral)   Resp 20   Ht 5' 8 (1.727 m)   Wt 78.4 kg  SpO2 98%   BMI 26.28 kg/m  Gen: NAD, alert and oriented x 3 HEENT: EOMI,no scleral icterus  Chest: regular rate and respiratory effort without adventitious sounds CV: RRR Abd: soft, non-distended, mildly ttp in lower abdomen, normoactive bowel sounds, without guarding Rectal: external exam revealed scant dark-red loose stool present on bed pad Ext: no edema, well perfused  Data: Lab Results  Component Value Date   WBC 13.4 (H) 05/14/2024   HGB 10.2 (L) 05/14/2024   HCT 32.2 (L) 05/14/2024   MCV 88.7 05/14/2024   PLT 445 (H) 05/14/2024   Recent Labs  Lab 05/14/24 1226  HGB 10.2*   Lab Results  Component Value Date   NA 135 05/15/2024   K 4.6 05/15/2024   CL 100 05/15/2024   CO2 21 (L) 05/15/2024   BUN 22 05/15/2024   CREATININE 1.61 (H) 05/15/2024   Lab Results  Component Value Date   ALT 13 05/02/2024   AST 20 05/02/2024   ALKPHOS 148 (H) 05/02/2024   BILITOT 0.3 05/02/2024   Recent Labs  Lab 05/15/24 1026  APTT 30  INR 1.1    Assessment/Plan: Rectal bleeding with possible proctitis -Continue plan as outlined by internal medicine.  -Plan for flexible sigmoidoscopy tomorrow. 2 Enemas to be administered tomorrow -Continue to hold Plavix /aspirin .  -Clear liquid diet pre-procedure. - NPO at midnight. -Initial magnesium  citrate bowel regimen along with BID miralax   Thank you for the consult. Please call with questions or concerns.  Lynda Rummer,  PA-C [facility]astroenter[facility] [phone]     [1] No Known Allergies  "

## 2024-05-15 NOTE — Hospital Course (Signed)
 Cynthia Singh is a 83 y.o. female with medical history significant of CAD, MI, PVD, HTN, HLD, DM, dCHF, GERD, depression, CKD 3B, vertigo, left bundle blockade, chronic back pain with narcotic dependency, who presents with left flank pain and rectal bleeding.  Patient had CT angio GI bleed scan, showed likely renal abscess, possible proctitis. Patient was placed on Zosyn , blood culture was sent out.

## 2024-05-15 NOTE — H&P (View-Only) (Signed)
 "   GI Inpatient Consult Note  Reason for Consult: Rectal Bleeding   Attending Requesting Consult: Dr Caleb Exon  History of Present Illness: Cynthia Singh is a 83 y.o. female seen for evaluation of rectal bleeding at the request of Dr Caleb Exon. Patient states she has a longstanding history of constipation. Most recently, she went about 1 week without having a bowel movement. They gave her stool softeners and laxatives at her facility, but she in unsure of any specifics. She states she was finally able to have a bowel movement yesterday which was bloody in appearance, prompting evaluation at the ED. She states it was a large volume of loose, malodorous, and visibly bloody stool in her brief. She does not recall having any episodes like this previously. She states that during this same timeframe, she had decreased appetite and generalized abdominal pain that improved after having a bowel movement. She reports having one small bowel movement this morning. She denies fever, chills, chest pain, shortness of breath, nausea, vomiting, or severe abdominal pain.  Per ED workup:  CT-GI bleed: 1. No CT evidence of active gastrointestinal hemorrhage. 2. Left nephrolithiasis with fluid density lesions that may represent developing left lower renal poll cortical abscesses. 3. Question proctitis with limited evaluation due to rectal under-distension. Recommend colonoscopy status post treatment and status post complete resolution of inflammatory changes to exclude an underlying lesion. 4. Other, non-acute and/or normal findings as above. Hgb 10.2 (down from 10.9 on 12/18) Hct 32.2  WBC 13.4, platelets 445  Per Internal Medicine: Rectal bleeding and possible proctitis: Hgb 10.9 --> 10.2.  CT of GI bleed is negative for active bleeding, it showed possible proctitis. - Patient was on Zosyn  as above - hold ASA and plavix  - IVF: 1L NS bolus - Start IV pantoprazole  40 mg bid - Zofran  IV for nausea -  Avoid NSAIDs and SQ heparin  - Maintain IV access (2 large bore IVs if possible). - Monitor closely and follow q6h cbc, transfuse as necessary, if Hgb<7.0 - LaB: INR, PTT and type screen - Epic message will be sent to Dr. Maryruth for GI consult at 6 AM.   Last Colonoscopy: patient unsure and unable to find on chart review  Past Medical History:  Past Medical History:  Diagnosis Date   CAD (coronary artery disease)    a. Remote nonobstructive disease but in 10/2012 Cath/PCI: s/p DES to RCA. b. cath 02/08/15 DES to prox LAD and balloon angioplasty of ost D1, EF 55-65%; c. 08/2019 PCI/DES prox/distal RCA (overlapping prev RCA stent). Prev placed RCA/LAD stents patent.   Concussion    SAH AFTER FALL 3/18   Depression    Diabetes mellitus, type 2 (HCC)    Dyslipidemia    Episodic mood disorder    HTN (hypertension)    Hypertension    Myocardial infarction (HCC)    2014   Obesity    Osteoarthritis of spine    knees also   Peripheral vascular disease, unspecified 2020   severe disease on home screening by insurance    Problem List: Patient Active Problem List   Diagnosis Date Noted   Renal abscess, left 05/14/2024   Rectal bleeding 05/14/2024   HLD (hyperlipidemia) 05/14/2024   Type II diabetes mellitus with renal manifestations (HCC) 05/14/2024   Chronic kidney disease, stage 3b (HCC) 05/14/2024   Depression 05/14/2024   Proctitis 05/14/2024   Chronic diastolic CHF (congestive heart failure) (HCC) 05/14/2024   HTN (hypertension)    Sepsis due  to gram-negative UTI (HCC) 04/28/2024   Left shoulder pain 02/16/2024   Stage 3a chronic kidney disease (HCC) 11/16/2023   Diabetes mellitus treated with insulin  and oral medication (HCC) 05/10/2023   Vertigo 01/05/2023   GERD (gastroesophageal reflux disease) 07/14/2021   Peripheral vascular disease, unspecified 2020   Chronic narcotic dependence (HCC) 10/29/2016   CAD (coronary artery disease) 07/19/2016   Preventative health care  04/30/2014   Advanced directives, counseling/discussion 04/30/2014   Atherosclerotic heart disease of native coronary artery with angina pectoris 10/21/2012   KNEE PAIN, LEFT, CHRONIC 02/25/2010   Type 2 diabetes mellitus (HCC) 12/30/2009   Hyperlipidemia associated with type 2 diabetes mellitus (HCC) 12/30/2009   Mood disorder 12/30/2009   Hypertension associated with type 2 diabetes mellitus (HCC) 12/30/2009   Chronic back pain 12/30/2009    Past Surgical History: Past Surgical History:  Procedure Laterality Date   ABDOMINAL HYSTERECTOMY     APPENDECTOMY  1959   CARDIAC CATHETERIZATION N/A 02/08/2015   Procedure: Left Heart Cath and Coronary Angiography;  Surgeon: Ozell Fell, MD;  Location: [facility] INVASIVE CV LAB;  Service: Cardiovascular;  Laterality: N/A;   CARDIAC CATHETERIZATION N/A 02/08/2015   Procedure: Coronary Stent Intervention;  Surgeon: Ozell Fell, MD;  Location: [facility] INVASIVE CV LAB;  Service: Cardiovascular;  Laterality: N/A;   CARDIAC CATHETERIZATION N/A 02/08/2015   Procedure: Intravascular Pressure Wire/FFR Study;  Surgeon: Ozell Fell, MD;  Location: [facility] INVASIVE CV LAB;  Service: Cardiovascular;  Laterality: N/A;   CATARACT EXTRACTION W/PHACO Left 11/10/2016   Procedure: CATARACT EXTRACTION PHACO AND INTRAOCULAR LENS PLACEMENT (IOC) suture placed in left eye at end of procedure;  Surgeon: Jaye Fallow, MD;  Location: [facility] ORS;  Service: Ophthalmology;  Laterality: Left;  US   3.20 AP% 27.7 CDE 55.63 Fluid pack lot # [id number]   CHOLECYSTECTOMY     COMBINED HYSTERECTOMY ABDOMINAL W/ A&P REPAIR / OOPHORECTOMY  1968   CORONARY ANGIOPLASTY     STENTS X 5   CORONARY STENT INTERVENTION N/A 09/19/2019   Procedure: CORONARY STENT INTERVENTION;  Surgeon: Mady Bruckner, MD;  Location: [facility] INVASIVE CV LAB;  Service: Cardiovascular;  Laterality: N/A;   CORONARY STENT PLACEMENT  2014   DILATION AND CURETTAGE OF UTERUS     GALLBLADDER SURGERY  1968   LEFT HEART CATH AND  CORONARY ANGIOGRAPHY Left 09/19/2019   Procedure: LEFT HEART CATH AND CORONARY ANGIOGRAPHY;  Surgeon: Mady Bruckner, MD;  Location: [facility] INVASIVE CV LAB;  Service: Cardiovascular;  Laterality: Left;   LEFT HEART CATHETERIZATION WITH CORONARY ANGIOGRAM N/A 10/20/2012   Procedure: LEFT HEART CATHETERIZATION WITH CORONARY ANGIOGRAM;  Surgeon: Ozell Fell, MD;  Location: [facility] CATH LAB;  Service: Cardiovascular;  Laterality: N/A;   multiple D&C     TONSILLECTOMY  age 53    Allergies: Allergies[1]  Home Medications: Medications Prior to Admission  Medication Sig Dispense Refill Last Dose/Taking   amLODipine  (NORVASC ) 2.5 MG tablet Take 1 tablet (2.5 mg total) by mouth daily. 90 tablet 1 05/14/2024   aspirin  EC 81 MG tablet Take 81 mg by mouth daily.   05/14/2024 at 10:11 AM   atorvastatin  (LIPITOR ) 40 MG tablet TAKE 1 TABLET BY MOUTH EVERY DAY 90 tablet 3 05/13/2024 at  9:33 PM   clopidogrel  (PLAVIX ) 75 MG tablet TAKE 1 TABLET BY MOUTH EVERY DAY 90 tablet 3 05/14/2024 at 10:11 AM   DULoxetine  (CYMBALTA ) 30 MG capsule TAKE 1 CAPSULE BY MOUTH EVERY DAY 90 capsule 1 05/14/2024 at 10:11 AM   empagliflozin  (JARDIANCE ) 25 MG  TABS tablet Take 1 tablet (25 mg total) by mouth daily before breakfast. 30 tablet 11 05/14/2024 at  5:03 AM   insulin  glargine (LANTUS  SOLOSTAR) 100 UNIT/ML Solostar Pen Inject 40 Units into the skin daily. TAKE 40 UNITS DAILY UNDER SKIN 15 mL 11 05/13/2024 at  9:51 AM   meclizine  (ANTIVERT ) 12.5 MG tablet Take 1-2 tablets (12.5-25 mg total) by mouth 3 (three) times daily as needed for dizziness. (Patient taking differently: Take 12.5 mg by mouth 3 (three) times daily as needed for dizziness.) 90 tablet 3 05/13/2024 at  9:51 AM   metFORMIN  (GLUCOPHAGE -XR) 500 MG 24 hr tablet TAKE 2 TABLETS (1,000 MG TOTAL) BY MOUTH IN THE MORNING AND AT BEDTIME. 360 tablet 1 05/14/2024 at 10:11 AM   nitroGLYCERIN  (NITROSTAT ) 0.4 MG SL tablet Place 1 tablet (0.4 mg total) under the tongue every 5 (five)  minutes as needed for chest pain. 30 tablet 0 Unknown   nortriptyline  (PAMELOR ) 50 MG capsule Take 3 capsules (150 mg total) by mouth at bedtime. 270 capsule 3 05/13/2024 at  9:33 PM   oxyCODONE -acetaminophen  (PERCOCET) 10-325 MG tablet Take 1 tablet by mouth every 6 (six) hours as needed for pain.   05/13/2024 at  6:58 PM   polyethylene glycol (MIRALAX  / GLYCOLAX ) packet Take 17 g by mouth daily as needed.    05/13/2024 at 11:21 AM   senna (SENOKOT) 8.6 MG TABS tablet Take 1 tablet by mouth every evening.   05/13/2024 at  9:33 PM   Insulin  Pen Needle (PEN NEEDLES) 32G X 5 MM MISC 1 Units by Does not apply route daily. 100 each 3    Home medication reconciliation was completed with the patient.   Scheduled Inpatient Medications:    amLODipine   2.5 mg Oral Daily   atorvastatin   40 mg Oral Daily   Chlorhexidine  Gluconate Cloth  6 each Topical Daily   DULoxetine   30 mg Oral Daily   insulin  aspart  0-5 Units Subcutaneous QHS   insulin  aspart  0-9 Units Subcutaneous TID WC   insulin  glargine  20 Units Subcutaneous Daily   nortriptyline   150 mg Oral QHS   pantoprazole  (PROTONIX ) IV  40 mg Intravenous Q12H    Continuous Inpatient Infusions:    piperacillin -tazobactam (ZOSYN )  IV 3.375 g (05/15/24 0546)    PRN Inpatient Medications:  acetaminophen , hydrALAZINE , meclizine , nitroGLYCERIN , ondansetron  (ZOFRAN ) IV, oxyCODONE -acetaminophen , polyethylene glycol  Family History: family history includes Cancer in her brother and son. She was adopted.  The patient's family history is negative for inflammatory bowel disorders, GI malignancy, or solid organ transplantation.  Social History:   reports that she has never smoked. She has been exposed to tobacco smoke. She has never used smokeless tobacco. She reports that she does not drink alcohol and does not use drugs. The patient denies ETOH, tobacco, or drug use.   Review of Systems: Constitutional: Weight is stable.  Eyes: No changes in  vision. ENT: No oral lesions, sore throat.  GI: see HPI.  Heme/Lymph: No easy bruising.  CV: No chest pain.  GU: No hematuria.  Integumentary: No rashes.  Neuro: No headaches.  Psych: No depression/anxiety.  Endocrine: No heat/cold intolerance.  Allergic/Immunologic: No urticaria.  Resp: No cough, SOB.  Musculoskeletal: No joint swelling.    Physical Examination: BP (!) 137/55 (BP Location: Right Arm)   Pulse 76   Temp (!) 97.5 F (36.4 C) (Oral)   Resp 20   Ht 5' 8 (1.727 m)   Wt 78.4 kg  SpO2 98%   BMI 26.28 kg/m  Gen: NAD, alert and oriented x 3 HEENT: EOMI,no scleral icterus  Chest: regular rate and respiratory effort without adventitious sounds CV: RRR Abd: soft, non-distended, mildly ttp in lower abdomen, normoactive bowel sounds, without guarding Rectal: external exam revealed scant dark-red loose stool present on bed pad Ext: no edema, well perfused  Data: Lab Results  Component Value Date   WBC 13.4 (H) 05/14/2024   HGB 10.2 (L) 05/14/2024   HCT 32.2 (L) 05/14/2024   MCV 88.7 05/14/2024   PLT 445 (H) 05/14/2024   Recent Labs  Lab 05/14/24 1226  HGB 10.2*   Lab Results  Component Value Date   NA 135 05/15/2024   K 4.6 05/15/2024   CL 100 05/15/2024   CO2 21 (L) 05/15/2024   BUN 22 05/15/2024   CREATININE 1.61 (H) 05/15/2024   Lab Results  Component Value Date   ALT 13 05/02/2024   AST 20 05/02/2024   ALKPHOS 148 (H) 05/02/2024   BILITOT 0.3 05/02/2024   Recent Labs  Lab 05/15/24 1026  APTT 30  INR 1.1    Assessment/Plan: Rectal bleeding with possible proctitis -Continue plan as outlined by internal medicine.  -Plan for flexible sigmoidoscopy tomorrow. 2 Enemas to be administered tomorrow -Continue to hold Plavix /aspirin .  -Clear liquid diet pre-procedure. - NPO at midnight. -Initial magnesium  citrate bowel regimen along with BID miralax   Thank you for the consult. Please call with questions or concerns.  Lynda Rummer,  PA-C [facility] Gastroenter[facility] [phone]     [1] No Known Allergies  "

## 2024-05-15 NOTE — TOC Initial Note (Signed)
 Transition of Care [facility]) - Initial/Assessment Note    Patient Details  Name: Cynthia Singh MRN: [id number] Date of Birth: 30-May-1940  Transition of Care [facility]) CM/SW Contact:    Nathanael CHRISTELLA Ring, RN Phone Number: 05/15/2024, 4:27 PM  Clinical Narrative:                 Patient came in from [facility], facility is unable to take her back at hospital DC, she verbalizes understanding of this and will be accepting of another facility.  She is from home with her son, he helps her at home.  CM will start bed search.   Expected Discharge Plan: Skilled Nursing Facility Barriers to Discharge: Continued Medical Work up   Patient Goals and CMS Choice Patient states their goals for this hospitalization and ongoing recovery are:: Agrees to SNF, understands she can't go back to [facility].gov Compare Post Acute Care list provided to:: Patient Choice offered to / list presented to : Patient      Expected Discharge Plan and Services   Discharge Planning Services: CM Consult   Living arrangements for the past 2 months: Single Family Home                 DME Arranged: N/A         HH Arranged: NA          Prior Living Arrangements/Services Living arrangements for the past 2 months: Single Family Home Lives with:: Adult Children Patient language and need for interpreter reviewed:: Yes Do you feel safe going back to the place where you live?: Yes      Need for Family Participation in Patient Care: Yes (Comment) Care giver support system in place?: Yes (comment) Current home services: DME (walker) Criminal Activity/Legal Involvement Pertinent to Current Situation/Hospitalization: No - Comment as needed  Activities of Daily Living   ADL Screening (condition at time of admission) Independently performs ADLs?: No Does the patient have a NEW difficulty with bathing/dressing/toileting/self-feeding that is expected to last >3 days?: No Does the patient have a NEW  difficulty with getting in/out of bed, walking, or climbing stairs that is expected to last >3 days?: No Does the patient have a NEW difficulty with communication that is expected to last >3 days?: No Is the patient deaf or have difficulty hearing?: No Does the patient have difficulty seeing, even when wearing glasses/contacts?: No Does the patient have difficulty concentrating, remembering, or making decisions?: No  Permission Sought/Granted Permission sought to share information with : Case Manager, Family Supports, Oceanographer granted to share information with : Yes, Verbal Permission Granted     Permission granted to share info w AGENCY: SNFs        Emotional Assessment Appearance:: Appears stated age Attitude/Demeanor/Rapport: Engaged Affect (typically observed): Accepting Orientation: : Oriented to Self, Oriented to Place, Oriented to  Time, Oriented to Situation Alcohol / Substance Use: Not Applicable Psych Involvement: No (comment)  Admission diagnosis:  Lower GI bleeding [K92.2] Pyelonephritis [N12] Renal abscess, left [N15.1] Urinary tract infection without hematuria, site unspecified [N39.0] Patient Active Problem List   Diagnosis Date Noted   Renal abscess, left 05/14/2024   Rectal bleeding 05/14/2024   HLD (hyperlipidemia) 05/14/2024   Type II diabetes mellitus with renal manifestations (HCC) 05/14/2024   Chronic kidney disease, stage 3b (HCC) 05/14/2024   Depression 05/14/2024   Proctitis 05/14/2024   Chronic diastolic CHF (congestive heart failure) (HCC) 05/14/2024   HTN (hypertension)  Sepsis due to gram-negative UTI (HCC) 04/28/2024   Left shoulder pain 02/16/2024   Stage 3a chronic kidney disease (HCC) 11/16/2023   Diabetes mellitus treated with insulin  and oral medication (HCC) 05/10/2023   Vertigo 01/05/2023   GERD (gastroesophageal reflux disease) 07/14/2021   Peripheral vascular disease, unspecified 2020   Chronic  narcotic dependence (HCC) 10/29/2016   CAD (coronary artery disease) 07/19/2016   Preventative health care 04/30/2014   Advanced directives, counseling/discussion 04/30/2014   Atherosclerotic heart disease of native coronary artery with angina pectoris 10/21/2012   KNEE PAIN, LEFT, CHRONIC 02/25/2010   Type 2 diabetes mellitus (HCC) 12/30/2009   Hyperlipidemia associated with type 2 diabetes mellitus (HCC) 12/30/2009   Mood disorder 12/30/2009   Hypertension associated with type 2 diabetes mellitus (HCC) 12/30/2009   Chronic back pain 12/30/2009   PCP:  Wendee Lynwood HERO, NP Pharmacy:   [phone] - [place], [place] - [address] [place] [address] Phone: [phone] Fax: [phone]  [id number] [place], [place] - [address] [place] [address] Phone: [phone] Fax: [phone]  [id number] - [place], [place] - [address] [place] [address] Phone: [phone] Fax: [phone]  [facility] - [place], [place] - [address] [place] [address] Phone: [phone] Fax: [phone]     Social Drivers of Health (SDOH) Social History: SDOH Screenings   Food Insecurity: No Food Insecurity (05/14/2024)  Housing: Low Risk (05/14/2024)  Transportation Needs: No Transportation Needs (05/14/2024)  Utilities: Not At Risk (05/14/2024)  Depression (PHQ2-9): Medium Risk (02/16/2024)  Financial Resource Strain: Medium Risk (06/24/2021)  Social Connections: Unknown (05/15/2024)  Recent Concern: Social Connections - Socially Isolated (05/14/2024)  Tobacco Use: Low Risk (05/14/2024)   SDOH Interventions:     Readmission Risk Interventions    05/01/2024   11:10 AM 05/02/2023   11:52 AM  Readmission Risk Prevention Plan  Transportation Screening Complete Complete  PCP or Specialist Appt within 5-7 Days  Complete   PCP or Specialist Appt within 3-5 Days Complete   Home Care Screening  Complete  Medication Review (RN CM)  Complete  HRI or Home Care Consult Complete   Palliative Care Screening Not Applicable   Medication Review (RN Care Manager) Complete

## 2024-05-15 NOTE — Plan of Care (Signed)

## 2024-05-16 ENCOUNTER — Inpatient Hospital Stay: Admitting: Anesthesiology

## 2024-05-16 ENCOUNTER — Encounter: Payer: Self-pay | Admitting: Internal Medicine

## 2024-05-16 ENCOUNTER — Encounter: Admitting: Anesthesiology

## 2024-05-16 ENCOUNTER — Encounter
Admission: EM | Disposition: A | Discharge status: Skilled Nursing Facility | Payer: Self-pay | Source: Home / Self Care | Attending: Student

## 2024-05-16 DIAGNOSIS — K6289 Other specified diseases of anus and rectum: Secondary | ICD-10-CM | POA: Diagnosis not present

## 2024-05-16 DIAGNOSIS — N1832 Chronic kidney disease, stage 3b: Secondary | ICD-10-CM | POA: Diagnosis not present

## 2024-05-16 DIAGNOSIS — N151 Renal and perinephric abscess: Secondary | ICD-10-CM | POA: Diagnosis not present

## 2024-05-16 HISTORY — PX: RECTAL BIOPSY: SHX2303

## 2024-05-16 HISTORY — PX: FLEXIBLE SIGMOIDOSCOPY: SHX5431

## 2024-05-16 LAB — GLUCOSE, CAPILLARY
Glucose-Capillary: 65 mg/dL — ABNORMAL LOW (ref 70–99)
Glucose-Capillary: 140 mg/dL — ABNORMAL HIGH (ref 70–99)
Glucose-Capillary: 107 mg/dL — ABNORMAL HIGH (ref 70–99)
Glucose-Capillary: 87 mg/dL (ref 70–99)
Glucose-Capillary: 133 mg/dL — ABNORMAL HIGH (ref 70–99)
Glucose-Capillary: 120 mg/dL — ABNORMAL HIGH (ref 70–99)

## 2024-05-16 LAB — URINE CULTURE

## 2024-05-16 LAB — HEMOGLOBIN AND HEMATOCRIT, BLOOD
HCT: 29.6 % — ABNORMAL LOW (ref 36.0–46.0)
Hemoglobin: 9.4 g/dL — ABNORMAL LOW (ref 12.0–15.0)

## 2024-05-16 SURGERY — SIGMOIDOSCOPY, FLEXIBLE
Anesthesia: General

## 2024-05-16 MED ORDER — DOCUSATE SODIUM 100 MG PO CAPS
100.0000 mg | ORAL_CAPSULE | Freq: Two times a day (BID) | ORAL | Status: DC
  Administered 2024-05-16 – 2024-05-19 (×7): 100 mg via ORAL
  Filled 2024-05-16 (×7): qty 1

## 2024-05-16 MED ORDER — FLEET ENEMA RE ENEM
1.0000 | ENEMA | Freq: Once | RECTAL | Status: AC
  Administered 2024-05-16: 1 via RECTAL

## 2024-05-16 MED ORDER — PROPOFOL 500 MG/50ML IV EMUL
INTRAVENOUS | Status: DC | PRN
  Administered 2024-05-16: 50 mg via INTRAVENOUS
  Administered 2024-05-16: 7 ug/kg/min via INTRAVENOUS

## 2024-05-16 MED ORDER — PHENYLEPHRINE 80 MCG/ML (10ML) SYRINGE FOR IV PUSH (FOR BLOOD PRESSURE SUPPORT)
PREFILLED_SYRINGE | INTRAVENOUS | Status: DC | PRN
  Administered 2024-05-16: 80 ug via INTRAVENOUS

## 2024-05-16 MED ORDER — DEXTROSE 50 % IV SOLN
25.0000 g | Freq: Once | INTRAVENOUS | Status: AC
  Administered 2024-05-16: 25 g via INTRAVENOUS
  Filled 2024-05-16: qty 50

## 2024-05-16 MED ORDER — SODIUM CHLORIDE 0.9 % IV SOLN: INTRAVENOUS | Status: DC | PRN

## 2024-05-16 MED ORDER — SODIUM CHLORIDE 0.9 % IV SOLN: Freq: Once | INTRAVENOUS | Status: AC

## 2024-05-16 MED ORDER — INSULIN GLARGINE 100 UNIT/ML ~~LOC~~ SOLN
8.0000 [IU] | Freq: Every day | SUBCUTANEOUS | Status: DC
  Filled 2024-05-16: qty 0.08

## 2024-05-16 MED ORDER — MORPHINE SULFATE (PF) 2 MG/ML IV SOLN
2.0000 mg | Freq: Once | INTRAVENOUS | Status: AC
  Administered 2024-05-16: 2 mg via INTRAVENOUS
  Filled 2024-05-16: qty 1

## 2024-05-16 NOTE — Op Note (Signed)
 [facility] Gastroenterology Patient Name: Cynthia Singh Procedure Date: 05/16/2024 12:54 PM MRN: [id number] Account #: [id number] Date of Birth: [birth year] Admit Type: Inpatient Age: 83 Room: [facility] ENDO ROOM 3 Gender: Female Note Status: Finalized Instrument Name: Peds Colonoscope [id number] Procedure:             Flexible Sigmoidoscopy Indications:           Hematochezia, Abnormal CT of the GI tract Providers:             [name] DO, DO Referring MD:          No Local Md, MD (Referring MD) Medicines:             Monitored Anesthesia Care Complications:         No immediate complications. Estimated blood loss:                         Minimal. Procedure:             Pre-Anesthesia Assessment:                        - Prior to the procedure, a History and Physical was                         performed, and patient medications and allergies were                         reviewed. The patient is competent. The risks and                         benefits of the procedure and the sedation options and                         risks were discussed with the patient. All questions                         were answered and informed consent was obtained.                         Patient identification and proposed procedure were                         verified by the physician, the nurse, the anesthetist                         and the technician in the endoscopy suite. Mental                         Status Examination: alert and oriented. Airway                         Examination: normal oropharyngeal airway and neck                         mobility. Respiratory Examination: clear to                         auscultation. CV Examination: regular rate and rhythm.  Prophylactic Antibiotics: The patient does not require                         prophylactic antibiotics. Prior Anticoagulants: The                         patient has taken Plavix   (clopidogrel ), last dose was                         2 days prior to procedure. ASA Grade Assessment: III -                         A patient with severe systemic disease. After                         reviewing the risks and benefits, the patient was                         deemed in satisfactory condition to undergo the                         procedure. The anesthesia plan was to use monitored                         anesthesia care (MAC). Immediately prior to                         administration of medications, the patient was                         re-assessed for adequacy to receive sedatives. The                         heart rate, respiratory rate, oxygen saturations,                         blood pressure, adequacy of pulmonary ventilation, and                         response to care were monitored throughout the                         procedure. The physical status of the patient was                         re-assessed after the procedure.                        After obtaining informed consent, the scope was passed                         under direct vision. The Colonoscope was introduced                         through the anus and advanced to the the sigmoid                         colon. The flexible sigmoidoscopy was accomplished  without difficulty. The patient tolerated the                         procedure well. The quality of the bowel preparation                         was poor. scope advanced to ~20 cm from anus where                         visualization was lost due to solid stool and scope                         was not advanced any further Findings:      The perianal and digital rectal examinations were normal. Pertinent       negatives include normal sphincter tone.      A large amount of stool was found in the rectum, in the recto-sigmoid       colon and in the sigmoid colon, interfering with visualization. scope       not advanced  beyond 20cm.      Multiple three mm ulcers were found in the rectum. Not actively       bleeding. Clot overlying one of the ulcers. Stigmata of recent bleeding       were present. For hemostasis, two hemostatic clips were successfully       placed (MR conditional). There was no bleeding at the end of the       procedure. Biopsies were taken with a cold forceps for histology.       Estimated blood loss was minimal.      ulcers around the rectum including near the anal canal. Biopsied with       cold forceps. Estimated blood loss was minimal.      Retroflexion not performed given ulceration in rectum, Estimated blood       loss: none. Impression:            - Preparation of the colon was poor.                        - Stool in the rectum, in the recto-sigmoid colon and                         in the sigmoid colon.                        - Multiple ulcers in the rectum. Clips (MR                         conditional) were placed. Biopsied. Recommendation:        - Return patient to hospital ward for ongoing care.                        - Advance diet as tolerated.                        - Continue bowel regimen- at least miralax  and colace                         twice a day. Pt would benefit from opioid induced  constipation as outpatient- ie- amitiza.                        - Await pathology results.                        - Repeat flexible sigmoidoscopy for surveillance based                         on pathology results.                        - The findings and recommendations were discussed with                         the patient.                        - The findings and recommendations were discussed with                         the referring physician.                        - No further planned GI intervention Procedure Code(s):     --- Professional ---                        [phone], Sigmoidoscopy, flexible; with control of                         bleeding,  any method Diagnosis Code(s):     --- Professional ---                        K62.6, Ulcer of anus and rectum                        K92.1, Melena (includes Hematochezia)                        R93.3, Abnormal findings on diagnostic imaging of                         other parts of digestive tract CPT copyright 2022 American Medical Association. All rights reserved. The codes documented in this report are preliminary and upon coder review may  be revised to meet current compliance requirements. Attending Participation:      I personally performed the entire procedure. Elspeth Jungling, DO [name] DO, DO 05/16/2024 1:39:45 PM This report has been signed electronically. Number of Addenda: 0 Note Initiated On: 05/16/2024 12:54 PM Total Procedure Duration: 0 hours 8 minutes 30 seconds  Estimated Blood Loss:  Estimated blood loss was minimal.      [facility]

## 2024-05-16 NOTE — Telephone Encounter (Signed)
 I called [facility] in [place]. I spoke with Nena and was told the brace was dispensed to her on 05/09/24.

## 2024-05-16 NOTE — Inpatient Diabetes Management (Signed)
 Inpatient Diabetes Program Recommendations  AACE/ADA: New Consensus Statement on Inpatient Glycemic Control   Target Ranges:  Prepandial:   less than 140 mg/dL      Peak postprandial:   less than 180 mg/dL (1-2 hours)      Critically ill patients:  140 - 180 mg/dL    Latest Reference Range & Units 05/14/24 22:33 05/15/24 11:55 05/15/24 16:36 05/15/24 20:54 05/16/24 07:43  Glucose-Capillary 70 - 99 mg/dL 897 (H) 896 (H) 853 (H) 162 (H) 65 (L)   Review of Glycemic Control  Diabetes history: DM2 Outpatient Diabetes medications: Jardiance  25 mg daily, Metformin  XR 1000 mg BID, Lantus  40 units daily Current orders for Inpatient glycemic control: Lantus  20 units daily, Novolog  0-9 units TID with meals, Novolog  0-5 units QHS  Inpatient Diabetes Program Recommendations:    Insulin : No Lantus  given since arrival and CBG 65 mg/dl this morning. Please consider discontinuing Lantus  for now. If CBGs become consistently over 180 mg/dl with Novolog  correction, could consider adding back low dose basal.  Thanks, Earnie Gainer, RN, MSN, CDCES Diabetes Coordinator Inpatient Diabetes Program [phone] (Team Pager from 8am to 5pm)

## 2024-05-16 NOTE — Interval H&P Note (Signed)
 History and Physical Interval Note: Consult note from 05/15/24  was reviewed and there was no interval change after seeing and examining the patient.  Cardiac murmur present; Written consent was obtained from the patient after discussion of risks, benefits, and alternatives. Patient has consented to proceed with Flexible Sigmoidoscopy with possible intervention   05/16/2024 1:06 PM  Cynthia Singh  has presented today for surgery, with the diagnosis of rectal bleeding.  The various methods of treatment have been discussed with the patient and family. After consideration of risks, benefits and other options for treatment, the patient has consented to  Procedures: SIGMOIDOSCOPY, FLEXIBLE (N/A) as a surgical intervention.  The patient's history has been reviewed, patient examined, no change in status, stable for surgery.  I have reviewed the patient's chart and labs.  Questions were answered to the patient's satisfaction.     [name]

## 2024-05-16 NOTE — Anesthesia Preprocedure Evaluation (Addendum)
 "                                  Anesthesia Evaluation  Patient identified by MRN, date of birth, ID band Patient awake    Reviewed: Allergy & Precautions, H&P , NPO status , Patient's Chart, lab work & pertinent test results  Airway Mallampati: II  TM Distance: >3 FB Neck ROM: full    Dental  (+) Edentulous Upper, Edentulous Lower   Pulmonary neg pulmonary ROS   Pulmonary exam normal        Cardiovascular hypertension, + CAD, + Past MI, + Cardiac Stents, + Peripheral Vascular Disease and +CHF  Normal cardiovascular exam     Neuro/Psych  PSYCHIATRIC DISORDERS  Depression    negative neurological ROS     GI/Hepatic negative GI ROS, Neg liver ROS,,,  Endo/Other  diabetes, Type 2    Renal/GU Renal InsufficiencyRenal disease  negative genitourinary   Musculoskeletal  (+) Arthritis ,    Abdominal Normal abdominal exam  (+)   Peds  Hematology negative hematology ROS (+)   Anesthesia Other Findings Patient had CT angio GI bleed scan, showed likely renal abscess, possible proctitis.  Past Medical History: No date: CAD (coronary artery disease)     Comment:  a. Remote nonobstructive disease but in 10/2012 Cath/PCI:              s/p DES to RCA. b. cath 02/08/15 DES to prox LAD and               balloon angioplasty of ost D1, EF 55-65%; c. 08/2019               PCI/DES prox/distal RCA (overlapping prev RCA stent).               Prev placed RCA/LAD stents patent. No date: Concussion     Comment:  [facility] AFTER FALL 3/18 No date: Depression No date: Diabetes mellitus, type 2 (HCC) No date: Dyslipidemia No date: Episodic mood disorder No date: HTN (hypertension) No date: Hypertension No date: Myocardial infarction [facility])     Comment:  2014 No date: Obesity No date: Osteoarthritis of spine     Comment:  knees also 2020: Peripheral vascular disease, unspecified     Comment:  severe disease on home screening by insurance  Past Surgical History: No date:  ABDOMINAL HYSTERECTOMY 1959: APPENDECTOMY 02/08/2015: CARDIAC CATHETERIZATION; N/A     Comment:  Procedure: Left Heart Cath and Coronary Angiography;                Surgeon: Ozell Fell, MD;  Location: [facility] INVASIVE CV               LAB;  Service: Cardiovascular;  Laterality: N/A; 02/08/2015: CARDIAC CATHETERIZATION; N/A     Comment:  Procedure: Coronary Stent Intervention;  Surgeon:               Ozell Fell, MD;  Location: [facility] INVASIVE CV LAB;                Service: Cardiovascular;  Laterality: N/A; 02/08/2015: CARDIAC CATHETERIZATION; N/A     Comment:  Procedure: Intravascular Pressure Wire/FFR Study;                Surgeon: Ozell Fell, MD;  Location: [facility] INVASIVE CV               LAB;  Service: Cardiovascular;  Laterality: N/A; 11/10/2016: CATARACT EXTRACTION W/PHACO; Left     Comment:  Procedure: CATARACT EXTRACTION PHACO AND INTRAOCULAR               LENS PLACEMENT (IOC) suture placed in left eye at end of               procedure;  Surgeon: Jaye Fallow, MD;  Location:               [facility] ORS;  Service: Ophthalmology;  Laterality: Left;  US               3.20 AP% 27.7 CDE 55.63 Fluid pack lot # [id number] No date: CHOLECYSTECTOMY 1968: COMBINED HYSTERECTOMY ABDOMINAL W/ A&P REPAIR / OOPHORECTOMY No date: CORONARY ANGIOPLASTY     Comment:  STENTS X 5 09/19/2019: CORONARY STENT INTERVENTION; N/A     Comment:  Procedure: CORONARY STENT INTERVENTION;  Surgeon: Mady Bruckner, MD;  Location: [facility] INVASIVE CV LAB;                Service: Cardiovascular;  Laterality: N/A; 2014: CORONARY STENT PLACEMENT No date: DILATION AND CURETTAGE OF UTERUS 1968: GALLBLADDER SURGERY 09/19/2019: LEFT HEART CATH AND CORONARY ANGIOGRAPHY; Left     Comment:  Procedure: LEFT HEART CATH AND CORONARY ANGIOGRAPHY;                Surgeon: Mady Bruckner, MD;  Location: [facility] INVASIVE               CV LAB;  Service: Cardiovascular;  Laterality: Left; 10/20/2012: LEFT HEART CATHETERIZATION  WITH CORONARY ANGIOGRAM; N/A     Comment:  Procedure: LEFT HEART CATHETERIZATION WITH CORONARY               ANGIOGRAM;  Surgeon: Ozell Fell, MD;  Location: [facility]               CATH LAB;  Service: Cardiovascular;  Laterality: N/A; No date: multiple D&C age 41: TONSILLECTOMY  BMI    Body Mass Index: 24.91 kg/m      Reproductive/Obstetrics negative OB ROS                              Anesthesia Physical Anesthesia Plan  ASA: 3  Anesthesia Plan: General   Post-op Pain Management:    Induction: Intravenous  PONV Risk Score and Plan: Propofol  infusion and TIVA  Airway Management Planned:   Additional Equipment:   Intra-op Plan:   Post-operative Plan:   Informed Consent: I have reviewed the patients History and Physical, chart, labs and discussed the procedure including the risks, benefits and alternatives for the proposed anesthesia with the patient or authorized representative who has indicated his/her understanding and acceptance.     Dental Advisory Given  Plan Discussed with: CRNA and Surgeon  Anesthesia Plan Comments:          Anesthesia Quick Evaluation  "

## 2024-05-16 NOTE — Evaluation (Signed)
 Occupational Therapy Evaluation Patient Details Name: Cynthia Singh MRN: [id number] DOB: 1940-12-22 Today's Date: 05/16/2024   History of Present Illness   Pt is a 83 y.o. female who presents with left flank pain and rectal bleeding, CT angio GI bleed scan, showed likely renal abscess, possible proctitis. Hospitalized 2  weeks ago and found to have subacute vertebral fracture L1, T5, T12, no neurosurgical intervention, TLSO brace when OOB, ok to take off with shower. PMH of CAD, MI, PVD, HTN, HLD, DM, dCHF, GERD, depression, CKD 3B, vertigo, left bundle blockade, chronic back pain with narcotic dependency     Clinical Impressions Pt was seen for OT evaluation this date. Pt is a poor historian, recently went to [facility] and came back to hospital from there. Reports she was only getting up to a recliner at rehab. Pt presents with deficits in strength, balance, pain management and activity tolerance limiting their ability to perform ADL management at baseline level. Pt currently requires Min A for bed mobility tasks and declines further activity d/t pain. She was able to attempt lateral scoots to [facility] with increased effort/time and CGA. Appears globally weak. Anticipate Max A for LB ADL management and Min A for UB ADLs. Pt would benefit from skilled OT services to address noted impairments and functional limitations to maximize safety and independence while minimizing future risk of falls, injury, and readmission. Do anticipate the need for follow up OT services upon acute hospital DC.      If plan is discharge home, recommend the following:   A lot of help with walking and/or transfers;A lot of help with bathing/dressing/bathroom     Functional Status Assessment   Patient has had a recent decline in their functional status and demonstrates the ability to make significant improvements in function in a reasonable and predictable amount of time.     Equipment Recommendations   Other  (comment) (defer)     Recommendations for Other Services         Precautions/Restrictions   Precautions Precautions: Fall;Back Recall of Precautions/Restrictions: Impaired Required Braces or Orthoses: Spinal Brace Spinal Brace: Thoracolumbosacral orthotic Spinal Brace Comments: patient apparently has TLSO for use when OOB per admission 1 week ago. Restrictions Weight Bearing Restrictions Per Provider Order: No     Mobility Bed Mobility Overal bed mobility: Needs Assistance Bed Mobility: Rolling, Sidelying to Sit, Sit to Sidelying Rolling: Supervision Sidelying to sit: Min assist, HOB elevated, Used rails     Sit to sidelying: Supervision General bed mobility comments: increased time and cues for comfort to log roll; lateral scoots along side of bed minimally with CGA/SBA    Transfers                   General transfer comment: pt declines d/t not feeling well      Balance Overall balance assessment: Needs assistance Sitting-balance support: Feet supported, Single extremity supported Sitting balance-Leahy Scale: Good Sitting balance - Comments: no LOB seated at EOB                                   ADL either performed or assessed with clinical judgement   ADL Overall ADL's : Needs assistance/impaired     Grooming: Wash/dry face;Set up Grooming Details (indicate cue type and reason): anticipate  General ADL Comments: anticipate Max A for LB ADL management d/t back/abdominal pain and weakness     Vision         Perception         Praxis         Pertinent Vitals/Pain Pain Assessment Pain Assessment: Faces Pain Score: 7  Faces Pain Scale: Hurts little more Pain Location: stomach and back Pain Descriptors / Indicators: Discomfort, Sore, Aching, Grimacing Pain Intervention(s): Monitored during session, Repositioned, Limited activity within patient's tolerance     Extremity/Trunk  Assessment Upper Extremity Assessment Upper Extremity Assessment: Generalized weakness   Lower Extremity Assessment Lower Extremity Assessment: Generalized weakness   Cervical / Trunk Assessment Cervical / Trunk Assessment: Other exceptions Cervical / Trunk Exceptions: reported back pain and abdominal pain recent back fxs to wear TLSO   Communication Communication Communication: Impaired Factors Affecting Communication: Difficulty expressing self   Cognition Arousal: Alert Behavior During Therapy: WFL for tasks assessed/performed                                 Following commands: Intact       Cueing  General Comments   Cueing Techniques: Verbal cues;Gestural cues      Exercises Other Exercises Other Exercises: Edu on role of OT in acute setting.   Shoulder Instructions      Home Living Family/patient expects to be discharged to:: Skilled nursing facility Living Arrangements: Children Available Help at Discharge: Family;Available PRN/intermittently Type of Home: House Home Access: Stairs to enter [employer] of Steps: 5 Entrance Stairs-Rails: Right Home Layout: One level     Bathroom Shower/Tub: Chief Strategy Officer: Handicapped height     Home Equipment: Tub bench;Grab bars - toilet;Grab bars - tub/shower;Cane - quad;Rollator (4 wheels);Rolling Walker (2 wheels)   Additional Comments: lives with sons      Prior Functioning/Environment Prior Level of Function : Patient poor historian/Family not available             Mobility Comments: reports falls, states she has only been getting up to chair at [place] [place] [facility], not walking. Unsure of accuracy ADLs Comments: pt states independent; pt recently at [facility] where she says she was getting in a chair    OT Problem List: Decreased strength;Decreased range of motion;Decreased activity tolerance;Impaired balance (sitting and/or standing);Decreased  cognition;Pain;Decreased knowledge of precautions;Decreased knowledge of use of DME or AE   OT Treatment/Interventions: Self-care/ADL training;Therapeutic exercise;Energy conservation;DME and/or AE instruction;Therapeutic activities;Cognitive remediation/compensation;Patient/family education;Balance training      OT Goals(Current goals can be found in the care plan section)   Acute Rehab OT Goals Patient Stated Goal: improve pain OT Goal Formulation: With patient Time For Goal Achievement: 05/30/24 Potential to Achieve Goals: Fair ADL Goals Pt Will Perform Grooming: sitting;with set-up Pt Will Perform Lower Body Dressing: with min assist;sitting/lateral leans;sit to/from stand;with contact guard assist Pt Will Transfer to Toilet: bedside commode;ambulating;with min assist;with contact guard assist Pt/caregiver will Perform Home Exercise Program: Increased strength;With theraband;Both right and left upper extremity;With written HEP provided   OT Frequency:  Min 2X/week    Co-evaluation PT/OT/SLP Co-Evaluation/Treatment: Yes Reason for Co-Treatment: For patient/therapist safety;To address functional/ADL transfers PT goals addressed during session: Mobility/safety with mobility;Balance OT goals addressed during session: ADL's and self-care      AM-PAC OT 6 Clicks Daily Activity     Outcome Measure Help from another person eating meals?: A Little Help from  another person taking care of personal grooming?: A Little Help from another person toileting, which includes using toliet, bedpan, or urinal?: A Lot Help from another person bathing (including washing, rinsing, drying)?: A Lot Help from another person to put on and taking off regular upper body clothing?: A Little Help from another person to put on and taking off regular lower body clothing?: A Lot 6 Click Score: 15   End of Session Nurse Communication: Mobility status  Activity Tolerance: Patient limited by pain Patient  left: in bed;with call bell/phone within reach;with bed alarm set  OT Visit Diagnosis: Unsteadiness on feet (R26.81);Other abnormalities of gait and mobility (R26.89);Muscle weakness (generalized) (M62.81);History of falling (Z91.81)                Time: 1000-1012 OT Time Calculation (min): 12 min Charges:  OT General Charges $OT Visit: 1 Visit OT Evaluation $OT Eval Low Complexity: 1 Low  Jawann Urbani Chrismon, OTR/L 05/16/2024, 1:45 PM  Jahlon Baines E Chrismon 05/16/2024, 1:41 PM

## 2024-05-16 NOTE — Transfer of Care (Signed)
 Immediate Anesthesia Transfer of Care Note  Patient: Cynthia Singh  Procedure(s) Performed: SIGMOIDOSCOPY, FLEXIBLE BIOPSY, RECTUM  Patient Location: PACU  Anesthesia Type:General  Level of Consciousness: awake  Airway & Oxygen Therapy: Patient Spontanous Breathing  Post-op Assessment: Report given to RN and Post -op Vital signs reviewed and stable  Post vital signs: Reviewed and stable  Last Vitals:  Vitals Value Taken Time  BP    Temp    Pulse    Resp    SpO2      Last Pain:  Vitals:   05/16/24 1236  TempSrc: Temporal  PainSc: 6       Patients Stated Pain Goal: 0 (05/15/24 1452)  Complications: There were no known notable events for this encounter.

## 2024-05-16 NOTE — Anesthesia Postprocedure Evaluation (Signed)
"   Anesthesia Post Note  Patient: Cynthia Singh  Procedure(s) Performed: SIGMOIDOSCOPY, FLEXIBLE BIOPSY, RECTUM  Patient location during evaluation: PACU Anesthesia Type: General Level of consciousness: awake and alert Pain management: pain level controlled Vital Signs Assessment: post-procedure vital signs reviewed and stable Respiratory status: spontaneous breathing, nonlabored ventilation and respiratory function stable Cardiovascular status: blood pressure returned to baseline and stable Postop Assessment: no apparent nausea or vomiting Anesthetic complications: no   There were no known notable events for this encounter.   Last Vitals:  Vitals:   05/16/24 1342 05/16/24 1352  BP: 129/61 (!) 137/48  Pulse: 73 70  Resp: 20 19  Temp:    SpO2: 100% 100%    Last Pain:  Vitals:   05/16/24 1352  TempSrc:   PainSc: Asleep                 Camellia Merilee Louder      "

## 2024-05-16 NOTE — Care Plan (Signed)
 Brief GI Post op note  Patient underwent flex sig today Prep was limited- solid stool present at 20 cm and scope advanced no further Multiple rectal ulcers, including in the anal canal were appreciated. Small clot was overlying one of these ulcers and could not be removed. Two hemostatic clips closed this area. I did bx these ulcers to r/o malignancy vs stercoral colitis.  Continue aggressive bowel regimen. Likely will need amitiza or like in outpatient setting.  Given bx, would expect some blood in the stool. If necessary, can resume DAPT tmrw  GI to sign off. Available as needed. Please do not hesitate to call regarding questions or concerns.  Elspeth EMERSON Jungling, DO [facility]

## 2024-05-16 NOTE — Progress Notes (Addendum)
" °  Progress Note   Patient: Cynthia Singh DOB: 07-22-1940 DOA: 05/14/2024     2 DOS: the patient was seen and examined on 05/16/2024   Brief hospital course: Cynthia Singh is a 83 y.o. female with medical history significant of CAD, MI, PVD, HTN, HLD, DM, dCHF, GERD, depression, CKD 3B, vertigo, left bundle blockade, chronic back pain with narcotic dependency, who presents with left flank pain and rectal bleeding.  Patient had CT angio GI bleed scan, showed likely renal abscess, possible proctitis. Patient was placed on Zosyn , blood culture was sent out.    Principal Problem:   Renal abscess, left Active Problems:   Rectal bleeding   Proctitis   HTN (hypertension)   CAD (coronary artery disease)   HLD (hyperlipidemia)   Chronic diastolic CHF (congestive heart failure) (HCC)   Type II diabetes mellitus with renal manifestations (HCC)   Chronic kidney disease, stage 3b (HCC)   Chronic narcotic dependence (HCC)   Depression   Assessment and Plan: Left renal abscess. Patient does not have sepsis.   She was recently in the hospital treated for E. coli urinary tract infection with bacteremia.  Repeat blood culture at that time was negative.  Presumed to be the same bacteria causing her renal abscess.  Based on prior culture, Rocephin  is a good choice.  Blood culture was not sent prior to antibiotics, I will resend blood culture today. Patient can be discharged home once culture results available. Patient also seen by urology, no surgery required.  Recommended follow-up with urology as outpatient. Urine culture came back with multiple species, recommend resent.  Urine culture sent again 12/30.   Rectal bleeding with possible proctitis  Bleeding seems to be better today, CT angio did not show any active bleeding.  Patient placed on Zosyn  to treat both renal abscess and proctitis.  Continue antibiotics, colonoscopy scheduled for today.   Essential hypertension   Current home medicines.   Coronary artery disease  Chronic diastolic congestive heart failure. No exacerbation.   Chronic kidney disease stage IIIb. Renal function still stable.        Subjective:  Patient doing well today, no additional rectal bleeding.  Physical Exam: Vitals:   05/16/24 0129 05/16/24 0500 05/16/24 0519 05/16/24 0741  BP: 117/77  120/61 (!) 133/48  Pulse: 69  70 71  Resp: 17  18   Temp: 97.7 F (36.5 C)  98 F (36.7 C) 97.7 F (36.5 C)  TempSrc: Oral   Oral  SpO2: 97%  98% 100%  Weight:  74.3 kg    Height:       General exam: Appears calm and comfortable  Respiratory system: Clear to auscultation. Respiratory effort normal. Cardiovascular system: S1 & S2 heard, RRR. No JVD, murmurs, rubs, gallops or clicks. No pedal edema. Gastrointestinal system: Abdomen is nondistended, soft and nontender. No organomegaly or masses felt. Normal bowel sounds heard. Central nervous system: Alert and oriented. No focal neurological deficits. Extremities: Symmetric 5 x 5 power. Skin: No rashes, lesions or ulcers Psychiatry: Judgement and insight appear normal. Mood & affect appropriate.    Data Reviewed:  Results reviewed.  Family Communication: None  Disposition: Status is: Inpatient Remains inpatient appropriate because: Severity of disease, IV treatment.     Time spent: 35 minutes  Author: Murvin Mana, MD 05/16/2024 12:12 PM  For on call review [email or web address].    "

## 2024-05-16 NOTE — Evaluation (Signed)
 Physical Therapy Evaluation Patient Details Name: Cynthia Singh MRN: [id number] DOB: 1940/06/14 Today's Date: 05/16/2024  History of Present Illness  Cynthia Singh is a 83 y.o. female with medical history significant of CAD, MI, PVD, HTN, HLD, DM, dCHF, GERD, depression, CKD 3B, vertigo, left bundle blockade, chronic back pain with narcotic dependency, who presents with left flank pain and rectal bleeding. Patient from [facility] and is not able to return.   Clinical Impression  Patient received in bed, she requires encouragement to participation in OT/PT evaluations. She reports abdominal pain. Patient is able to sit edge of bed with min/mod A. She reports back pain with sitting. She was able to scoot up in bed while sitting 1-2 times. She declined attempting to stand. Patient will continue to benefit from skilled PT to improve independence with mobility.           If plan is discharge home, recommend the following: A lot of help with walking and/or transfers;A lot of help with bathing/dressing/bathroom;Assistance with cooking/housework;Help with stairs or ramp for entrance;Assist for transportation   Can travel by private vehicle   No    Equipment Recommendations None recommended by PT  Recommendations for Other Services       Functional Status Assessment Patient has had a recent decline in their functional status and demonstrates the ability to make significant improvements in function in a reasonable and predictable amount of time.     Precautions / Restrictions Precautions Precautions: Fall;Back Recall of Precautions/Restrictions: Impaired Spinal Brace Comments: patient apparently has TLSO for use when OOB per admission 1 week ago. Restrictions Weight Bearing Restrictions Per Provider Order: No      Mobility  Bed Mobility Overal bed mobility: Needs Assistance Bed Mobility: Rolling, Sidelying to Sit, Sit to Sidelying Rolling: Supervision Sidelying to sit: Min  assist     Sit to sidelying: Supervision      Transfers                   General transfer comment: declines attempting to stand this session    Ambulation/Gait               General Gait Details: unable  Stairs            Wheelchair Mobility     Tilt Bed    Modified Rankin (Stroke Patients Only)       Balance Overall balance assessment: Modified Independent Sitting-balance support: Feet supported, Single extremity supported Sitting balance-Leahy Scale: Good                                       Pertinent Vitals/Pain Pain Assessment Pain Assessment: Faces Faces Pain Scale: Hurts little more Pain Location: stomach and back Pain Descriptors / Indicators: Discomfort, Sore, Aching, Grimacing Pain Intervention(s): Monitored during session, Repositioned    Home Living Family/patient expects to be discharged to:: Skilled nursing facility Living Arrangements: Children Available Help at Discharge: Family;Available PRN/intermittently Type of Home: House Home Access: Stairs to enter Entrance Stairs-Rails: Right Entrance Stairs-Number of Steps: 5   Home Layout: One level Home Equipment: Tub bench;Grab bars - toilet;Grab bars - tub/shower;Cane - quad;Rollator (4 wheels);Rolling Environmental Consultant (2 wheels) Additional Comments: lives with sons    Prior Function Prior Level of Function : Patient poor historian/Family not available             Mobility Comments: reports falls, states she  has only been getting up to chair at [place] place, not walking. Unsure of accuracy ADLs Comments: pt states independent; pt recently at [facility] [place] where she says she was getting in a chair     Extremity/Trunk Assessment   Upper Extremity Assessment Upper Extremity Assessment: Defer to OT evaluation    Lower Extremity Assessment Lower Extremity Assessment: Generalized weakness    Cervical / Trunk Assessment Cervical / Trunk Assessment: Other  exceptions Cervical / Trunk Exceptions: reported back pain and abdominal pain  Communication   Communication Communication: Impaired Factors Affecting Communication: Difficulty expressing self    Cognition Arousal: Alert Behavior During Therapy: WFL for tasks assessed/performed   PT - Cognitive impairments: Problem solving, Safety/Judgement, No family/caregiver present to determine baseline                         Following commands: Intact       Cueing Cueing Techniques: Verbal cues, Gestural cues     General Comments      Exercises     Assessment/Plan    PT Assessment Patient needs continued PT services  PT Problem List Decreased activity tolerance;Decreased mobility;Decreased balance;Pain;Decreased strength;Decreased cognition       PT Treatment Interventions DME instruction;Gait training;Functional mobility training;Therapeutic activities;Therapeutic exercise;Balance training;Neuromuscular re-education;Cognitive remediation;Patient/family education;Wheelchair mobility training    PT Goals (Current goals can be found in the Care Plan section)  Acute Rehab PT Goals Patient Stated Goal: return to rehab PT Goal Formulation: With patient Time For Goal Achievement: 05/30/24 Potential to Achieve Goals: Good    Frequency Min 2X/week     Co-evaluation PT/OT/SLP Co-Evaluation/Treatment: Yes   PT goals addressed during session: Mobility/safety with mobility;Balance         AM-PAC PT 6 Clicks Mobility  Outcome Measure Help needed turning from your back to your side while in a flat bed without using bedrails?: A Little Help needed moving from lying on your back to sitting on the side of a flat bed without using bedrails?: A Little Help needed moving to and from a bed to a chair (including a wheelchair)?: A Lot Help needed standing up from a chair using your arms (e.g., wheelchair or bedside chair)?: A Lot Help needed to walk in hospital room?:  Total Help needed climbing 3-5 steps with a railing? : Total 6 Click Score: 12    End of Session   Activity Tolerance: Patient limited by pain Patient left: in bed;with call bell/phone within reach;with bed alarm set Nurse Communication: Mobility status PT Visit Diagnosis: Other abnormalities of gait and mobility (R26.89);Muscle weakness (generalized) (M62.81);Difficulty in walking, not elsewhere classified (R26.2);Pain Pain - part of body:  (abdomen and back)    Time: 1000-1008 PT Time Calculation (min) (ACUTE ONLY): 8 min   Charges:   PT Evaluation $PT Eval Low Complexity: 1 Low   PT General Charges $$ ACUTE PT VISIT: 1 Visit         Valdis Bevill, PT, GCS 05/16/2024,10:38 AM

## 2024-05-17 ENCOUNTER — Inpatient Hospital Stay

## 2024-05-17 DIAGNOSIS — N151 Renal and perinephric abscess: Secondary | ICD-10-CM | POA: Diagnosis not present

## 2024-05-17 LAB — BASIC METABOLIC PANEL WITH GFR
Anion gap: 10 (ref 5–15)
BUN: 25 mg/dL — ABNORMAL HIGH (ref 8–23)
CO2: 24 mmol/L (ref 22–32)
Calcium: 8.6 mg/dL — ABNORMAL LOW (ref 8.9–10.3)
Chloride: 100 mmol/L (ref 98–111)
Creatinine, Ser: 2.15 mg/dL — ABNORMAL HIGH (ref 0.44–1.00)
GFR, Estimated: 22 mL/min — ABNORMAL LOW
Glucose, Bld: 114 mg/dL — ABNORMAL HIGH (ref 70–99)
Potassium: 4.7 mmol/L (ref 3.5–5.1)
Sodium: 134 mmol/L — ABNORMAL LOW (ref 135–145)

## 2024-05-17 LAB — CBC
HCT: 29.9 % — ABNORMAL LOW (ref 36.0–46.0)
Hemoglobin: 9.4 g/dL — ABNORMAL LOW (ref 12.0–15.0)
MCH: 28.1 pg (ref 26.0–34.0)
MCHC: 31.4 g/dL (ref 30.0–36.0)
MCV: 89.3 fL (ref 80.0–100.0)
Platelets: 348 K/uL (ref 150–400)
RBC: 3.35 MIL/uL — ABNORMAL LOW (ref 3.87–5.11)
RDW: 14.7 % (ref 11.5–15.5)
WBC: 10 K/uL (ref 4.0–10.5)
nRBC: 0 % (ref 0.0–0.2)

## 2024-05-17 LAB — PHOSPHORUS: Phosphorus: 3.5 mg/dL (ref 2.5–4.6)

## 2024-05-17 LAB — GLUCOSE, CAPILLARY
Glucose-Capillary: 96 mg/dL (ref 70–99)
Glucose-Capillary: 87 mg/dL (ref 70–99)
Glucose-Capillary: 138 mg/dL — ABNORMAL HIGH (ref 70–99)
Glucose-Capillary: 124 mg/dL — ABNORMAL HIGH (ref 70–99)

## 2024-05-17 LAB — MAGNESIUM: Magnesium: 2.5 mg/dL — ABNORMAL HIGH (ref 1.7–2.4)

## 2024-05-17 MED ORDER — SODIUM CHLORIDE 0.9 % IV SOLN: INTRAVENOUS | Status: DC

## 2024-05-17 MED ORDER — ASPIRIN 81 MG PO TBEC
81.0000 mg | DELAYED_RELEASE_TABLET | Freq: Every day | ORAL | Status: DC
  Administered 2024-05-17 – 2024-05-19 (×3): 81 mg via ORAL
  Filled 2024-05-17 (×3): qty 1

## 2024-05-17 MED ORDER — BISACODYL 10 MG RE SUPP: 10.0000 mg | Freq: Every day | RECTAL | Status: DC | PRN

## 2024-05-17 MED ORDER — BISACODYL 5 MG PO TBEC
10.0000 mg | DELAYED_RELEASE_TABLET | Freq: Every day | ORAL | Status: DC
  Administered 2024-05-17 – 2024-05-18 (×2): 10 mg via ORAL
  Filled 2024-05-17 (×3): qty 2

## 2024-05-17 MED ORDER — BISACODYL 5 MG PO TBEC: 10.0000 mg | DELAYED_RELEASE_TABLET | Freq: Every day | ORAL | Status: DC

## 2024-05-17 MED ORDER — CLOPIDOGREL BISULFATE 75 MG PO TABS
75.0000 mg | ORAL_TABLET | Freq: Every day | ORAL | Status: DC
  Administered 2024-05-17 – 2024-05-19 (×3): 75 mg via ORAL
  Filled 2024-05-17 (×3): qty 1

## 2024-05-17 NOTE — Progress Notes (Signed)
 Occupational Therapy Treatment Patient Details Name: Cynthia Singh MRN: [id number] DOB: Jun 26, 1940 Today's Date: 05/17/2024   History of present illness Pt is a 83 y.o. female who presents with left flank pain and rectal bleeding, CT angio GI bleed scan, showed likely renal abscess, possible proctitis. Hospitalized 2  weeks ago and found to have subacute vertebral fracture L1, T5, T12, no neurosurgical intervention, TLSO brace when OOB, ok to take off with shower. PMH of CAD, MI, PVD, HTN, HLD, DM, dCHF, GERD, depression, CKD 3B, vertigo, left bundle blockade, chronic back pain with narcotic dependency   OT comments  Cynthia Singh was seen for an additional OT tx session with emphasis on pt education and safe use of AE/DME for ADL management. Upon arrival to room pt supine in bed, agreeable to tx session. OT facilitated pt education and assistance as described below. See ADL section for additional details regarding occupational performance. Pt continues to be functionally limited by increased pain with mobility, generalized weakness, poor balance, and poor activity tolerance. Pt return verbalizes understanding of education provided t/o session. Pt is progressing toward OT goals and continues to benefit from skilled OT services to maximize return to PLOF and minimize risk of future falls, injury, and readmission. Will continue to follow POC as written. Discharge recommendation remains appropriate.        If plan is discharge home, recommend the following:  A lot of help with walking and/or transfers;A lot of help with bathing/dressing/bathroom   Equipment Recommendations  Other (comment)    Recommendations for Other Services      Precautions / Restrictions Precautions Precautions: Fall;Back Recall of Precautions/Restrictions: Impaired Required Braces or Orthoses: Spinal Brace Spinal Brace: Thoracolumbosacral orthotic Spinal Brace Comments: patient has TLSO for use when OOB per admission 1  week ago. Restrictions Weight Bearing Restrictions Per Provider Order: No       Mobility Bed Mobility Overal bed mobility: Needs Assistance Bed Mobility: Rolling, Sidelying to Sit, Sit to Sidelying Rolling: Supervision Sidelying to sit: Min assist, HOB elevated, Used rails       General bed mobility comments: NT, pt declines OOB/EOB activity 2/2 pain. Session focused on pt education and safe use of AE/DME for ADL management.    Transfers Overall transfer level: Needs assistance Equipment used: Rolling walker (2 wheels) Transfers: Sit to/from Stand Sit to Stand: Contact guard assist, +2 physical assistance           General transfer comment: CGA +2 with RW to take a few effortful side steps toward HOB.     Balance                                           ADL either performed or assessed with clinical judgement   ADL Overall ADL's : Needs assistance/impaired     Grooming: Sitting;Supervision/safety;Set up;Wash/dry face Grooming Details (indicate cue type and reason): Sitting EOB, fatigues quickly                 Toilet Transfer: Minimal assistance;BSC/3in1;Stand-pivot Toilet Transfer Details (indicate cue type and reason): Anticipate, educated on safe use of BSC, positioning, and falls prevention for toilet transfers in room. Pt declines further OOB/EOB activity this PM.         Functional mobility during ADLs: Contact guard assist;Rolling walker (2 wheels);+2 for physical assistance General ADL Comments: +2 CGA to take side steps toward HOB. Pt  declines to use TLSO for brief mobility, discussed importance of following MD's orders for safety and recovery, especially with longer functional transfers. Return verbalizes understanding.    Extremity/Trunk Assessment              Vision Patient Visual Report: No change from baseline     Perception     Praxis     Communication Communication Communication: No apparent difficulties    Cognition Arousal: Alert Behavior During Therapy: WFL for tasks assessed/performed Cognition: No family/caregiver present to determine baseline             OT - Cognition Comments: Generally able to follow VCs, oriented to self and place.                 Following commands: Intact Following commands impaired: Follows one step commands with increased time      Cueing   Cueing Techniques: Verbal cues, Gestural cues  Exercises Other Exercises Other Exercises: Pt educated on safety, falls prevention, safe use of AE/DME for ADL management, and DC recs.    Shoulder Instructions       General Comments      Pertinent Vitals/ Pain       Pain Assessment Pain Assessment: Faces Faces Pain Scale: Hurts little more Pain Location: back pain with bed mobility Pain Descriptors / Indicators: Discomfort, Sore Pain Intervention(s): Limited activity within patient's tolerance, Monitored during session  Home Living                                          Prior Functioning/Environment              Frequency  Min 2X/week        Progress Toward Goals  OT Goals(current goals can now be found in the care plan section)  Progress towards OT goals: Progressing toward goals  Acute Rehab OT Goals Patient Stated Goal: improve pain/function OT Goal Formulation: With patient Time For Goal Achievement: 05/30/24 Potential to Achieve Goals: Fair  Plan      Co-evaluation      Reason for Co-Treatment: To address functional/ADL transfers PT goals addressed during session: Mobility/safety with mobility OT goals addressed during session: ADL's and self-care      AM-PAC OT 6 Clicks Daily Activity     Outcome Measure   Help from another person eating meals?: A Little Help from another person taking care of personal grooming?: A Little Help from another person toileting, which includes using toliet, bedpan, or urinal?: A Lot Help from another person  bathing (including washing, rinsing, drying)?: A Lot Help from another person to put on and taking off regular upper body clothing?: A Little Help from another person to put on and taking off regular lower body clothing?: A Lot 6 Click Score: 15    End of Session    OT Visit Diagnosis: Unsteadiness on feet (R26.81);Other abnormalities of gait and mobility (R26.89);Muscle weakness (generalized) (M62.81);History of falling (Z91.81)   Activity Tolerance Patient limited by pain   Patient Left in bed;with call bell/phone within reach;with bed alarm set   Nurse Communication          Time: 8676-8667 OT Time Calculation (min): 9 min  Charges: OT General Charges $OT Visit: 1 Visit OT Treatments $Self Care/Home Management : 8-22 mins  Jhonny Pelton, M.S., OTR/L 05/17/2024, 2:57 PM

## 2024-05-17 NOTE — TOC Progression Note (Signed)
 Transition of Care [facility]) - Progression Note    Patient Details  Name: Cynthia Singh MRN: [id number] Date of Birth: 03/09/1941  [facility] CM/SW Contact  Nathanael CHRISTELLA Ring, RN Phone Number: 05/17/2024, 3:20 PM  Clinical Narrative:    I called and spoke with patient via phone, presented bed offers, she chooses [place].  She says that she thinks they said she could DC tomorrow.  CM will start insurance auth.    Expected Discharge Plan: Skilled Nursing Facility Barriers to Discharge: Continued Medical Work up               Expected Discharge Plan and Services   Discharge Planning Services: CM Consult   Living arrangements for the past 2 months: Single Family Home                 DME Arranged: N/A         HH Arranged: NA           Social Drivers of Health (SDOH) Interventions SDOH Screenings   Food Insecurity: No Food Insecurity (05/14/2024)  Housing: Low Risk (05/14/2024)  Transportation Needs: No Transportation Needs (05/14/2024)  Utilities: Not At Risk (05/14/2024)  Depression (PHQ2-9): Medium Risk (02/16/2024)  Financial Resource Strain: Medium Risk (06/24/2021)  Social Connections: Unknown (05/15/2024)  Recent Concern: Social Connections - Socially Isolated (05/14/2024)  Tobacco Use: Low Risk (05/16/2024)    Readmission Risk Interventions    05/15/2024    4:32 PM 05/01/2024   11:10 AM 05/02/2023   11:52 AM  Readmission Risk Prevention Plan  Transportation Screening Complete Complete Complete  PCP or Specialist Appt within 5-7 Days Complete  Complete  PCP or Specialist Appt within 3-5 Days  Complete   Home Care Screening Complete  Complete  Medication Review (RN CM) Complete  Complete  HRI or Home Care Consult  Complete   Palliative Care Screening  Not Applicable   Medication Review (RN Care Manager)  Complete

## 2024-05-17 NOTE — Progress Notes (Signed)
 " Progress Note   Patient: Cynthia Singh FMW:[id number] DOB: 07-29-1940 DOA: 05/14/2024     3 DOS: the patient was seen and examined on 05/17/2024   Brief hospital course: Cynthia Singh is a 83 y.o. female with medical history significant of CAD, MI, PVD, HTN, HLD, DM, dCHF, GERD, depression, CKD 3B, vertigo, left bundle blockade, chronic back pain with narcotic dependency, who presents with left flank pain and rectal bleeding.  Patient had CT angio GI bleed scan, showed likely renal abscess, possible proctitis. Patient was placed on Zosyn , blood culture was sent out.    Principal Problem:   Renal abscess, left Active Problems:   Rectal bleeding   Proctitis   HTN (hypertension)   CAD (coronary artery disease)   HLD (hyperlipidemia)   Chronic diastolic CHF (congestive heart failure) (HCC)   Type II diabetes mellitus with renal manifestations (HCC)   Chronic kidney disease, stage 3b (HCC)   Chronic narcotic dependence (HCC)   Depression   Assessment and Plan:  # Left renal abscess. Patient does not have sepsis.   She was recently in the hospital treated for E. coli urinary tract infection with bacteremia.  Repeat blood culture at that time was negative.  Presumed to be the same bacteria causing her renal abscess.  Based on prior culture, Rocephin  is a good choice.  Blood culture was not sent prior to antibiotics, I will resend blood culture today. Patient can be discharged home once culture results available. Patient also seen by urology, no surgery required.  Recommended follow-up with urology as outpatient. Urine culture came back with multiple species, recommend resent.  Urine culture sent again 12/30. 12/30 urine culture pending   Rectal bleeding with possible proctitis  Bleeding seems to be better today, CT angio did not show any active bleeding.  Patient placed on Zosyn  to treat both renal abscess and proctitis.  Continue antibiotics,  12/30 S/p colonoscopy: Poor  prep, multiple rectal ulcers, clips placed and biopsy done.  GI recommended to start DAPT if no active bleeding on 12/31 Recommended MiraLAX  and Colace twice a day, will benefit from opioid-induced constipation Amitiza.  Follow pathology    Essential hypertension  Current home medicines.   Coronary artery disease  Chronic diastolic congestive heart failure. No exacerbation. 12/31 resumed aspirin  and Plavix , cleared by GI No active bleeding Monitor H&H  AKI Chronic kidney disease stage IIIb. Baseline creatinine 1.54 sCr 2.15 elevated Started IV fluid for hydration Check bladder scan to rule out urinary retention Follow renal sonogram    Subjective:  No significant events overnight patient denies any abdominal pain today, has not had bowel movement yet.  No GI bleeding.  Physical Exam: Vitals:   05/17/24 0359 05/17/24 0433 05/17/24 0754 05/17/24 1556  BP:  (!) 134/52 131/65 (!) 138/58  Pulse:  74 78 68  Resp:  20 17 16   Temp:  97.9 F (36.6 C) 98 F (36.7 C) 98.3 F (36.8 C)  TempSrc:  Oral Oral   SpO2:  96% 95% 99%  Weight: 74.3 kg     Height:       General exam: Appears calm and comfortable  Respiratory system: Clear to auscultation. Respiratory effort normal. Cardiovascular system: S1 & S2 heard, RRR. No murmurs Gastrointestinal system: Abdomen is nondistended, soft and nontender. Normal bowel sounds heard. Central nervous system: Alert and oriented. No focal neurological deficits. Extremities: No edema  Skin: No rashes, lesions or ulcers Psychiatry: Judgement and insight appear normal. Mood & affect appropriate.  Data Reviewed:  Results reviewed.  Family Communication: None  Disposition: Status is: Inpatient Remains inpatient appropriate because: Severity of disease, IV treatment.     Time spent: 35 minutes  Author: Elvan Sor, MD 05/17/2024 4:13 PM  For on call review [email or web address].    "

## 2024-05-17 NOTE — Plan of Care (Signed)

## 2024-05-17 NOTE — Care Management Important Message (Signed)
 Important Message  Patient Details  Name: Cynthia Singh MRN: [id number] Date of Birth: 12/12/1940   Important Message Given:  Yes - Medicare IM     Alistair Senft W, CMA 05/17/2024, 10:58 AM

## 2024-05-17 NOTE — Progress Notes (Signed)
 Physical Therapy Treatment Patient Details Name: Cynthia Singh MRN: [id number] DOB: 1940-06-30 Today's Date: 05/17/2024   History of Present Illness Pt is a 83 y.o. female who presents with left flank pain and rectal bleeding, CT angio GI bleed scan, showed likely renal abscess, possible proctitis. Hospitalized 2  weeks ago and found to have subacute vertebral fracture L1, T5, T12, no neurosurgical intervention, TLSO brace when OOB, ok to take off with shower. PMH of CAD, MI, PVD, HTN, HLD, DM, dCHF, GERD, depression, CKD 3B, vertigo, left bundle blockade, chronic back pain with narcotic dependency    PT Comments  Patient received in bed, she reports she is not feeling well. Stomach and back pain. She is agreeable to PT with encouragement. Patient requires mod A for side lying to sitting up on side of bed. She is able to stand with min A and elevated bed. Patient was able to side step with RW and min A at edge of bed. B LEs weak and demonstrate some buckling. She will continue to benefit from skilled PT to improve strength and independence with mobility.     If plan is discharge home, recommend the following: A lot of help with walking and/or transfers;A lot of help with bathing/dressing/bathroom;Assistance with cooking/housework;Help with stairs or ramp for entrance;Assist for transportation   Can travel by private vehicle     No  Equipment Recommendations  Rolling walker (2 wheels)    Recommendations for Other Services       Precautions / Restrictions Precautions Precautions: Fall;Back Recall of Precautions/Restrictions: Impaired Required Braces or Orthoses: Spinal Brace Spinal Brace: Thoracolumbosacral orthotic Spinal Brace Comments: patient has TLSO for use when OOB per admission 1 week ago. Restrictions Weight Bearing Restrictions Per Provider Order: No     Mobility  Bed Mobility Overal bed mobility: Needs Assistance Bed Mobility: Rolling, Sidelying to Sit, Sit to  Sidelying Rolling: Contact guard assist Sidelying to sit: Mod assist, HOB elevated, Used rails     Sit to sidelying: Min assist General bed mobility comments: increased time for bed mobility    Transfers Overall transfer level: Needs assistance Equipment used: Rolling walker (2 wheels) Transfers: Sit to/from Stand Sit to Stand: Min assist, From elevated surface           General transfer comment: attempted to stand from low bed, was unable. Required bed height to be elevated to stand. Legs weak    Ambulation/Gait Ambulation/Gait assistance: Min assist Gait Distance (Feet): 3 Feet Assistive device: Rolling walker (2 wheels) Gait Pattern/deviations: Step-to pattern Gait velocity: decr     General Gait Details: patient able to take several side steps with walker and min A. LEs weak and buckling during stepping.   Stairs             Wheelchair Mobility     Tilt Bed    Modified Rankin (Stroke Patients Only)       Balance Overall balance assessment: Needs assistance Sitting-balance support: Feet supported Sitting balance-Leahy Scale: Fair     Standing balance support: Bilateral upper extremity supported, During functional activity, Reliant on assistive device for balance Standing balance-Leahy Scale: Poor                              Communication Communication Communication: No apparent difficulties  Cognition Arousal: Alert Behavior During Therapy: WFL for tasks assessed/performed   PT - Cognitive impairments: No apparent impairments  Following commands: Intact Following commands impaired: Follows one step commands with increased time    Cueing Cueing Techniques: Verbal cues, Gestural cues  Exercises      General Comments        Pertinent Vitals/Pain Pain Assessment Pain Assessment: Faces (Simultaneous filing. User may not have seen previous data.) Faces Pain Scale: Hurts a little bit  (Simultaneous filing. User may not have seen previous data.) Pain Location: stomach and back (Simultaneous filing. User may not have seen previous data.) Pain Descriptors / Indicators: Discomfort, Sore (Simultaneous filing. User may not have seen previous data.) Pain Intervention(s): Monitored during session, Repositioned (Simultaneous filing. User may not have seen previous data.)    Home Living                          Prior Function            PT Goals (current goals can now be found in the care plan section) Acute Rehab PT Goals Patient Stated Goal: return to rehab PT Goal Formulation: With patient Time For Goal Achievement: 05/30/24 Potential to Achieve Goals: Good Progress towards PT goals: Progressing toward goals    Frequency    Min 2X/week      PT Plan      Co-evaluation PT/OT/SLP Co-Evaluation/Treatment: Yes Reason for Co-Treatment: To address functional/ADL transfers PT goals addressed during session: Mobility/safety with mobility OT goals addressed during session: ADL's and self-care      AM-PAC PT 6 Clicks Mobility   Outcome Measure  Help needed turning from your back to your side while in a flat bed without using bedrails?: A Little Help needed moving from lying on your back to sitting on the side of a flat bed without using bedrails?: A Lot Help needed moving to and from a bed to a chair (including a wheelchair)?: A Lot Help needed standing up from a chair using your arms (e.g., wheelchair or bedside chair)?: A Lot Help needed to walk in hospital room?: A Lot Help needed climbing 3-5 steps with a railing? : Total 6 Click Score: 12    End of Session   Activity Tolerance: Patient limited by fatigue;Patient limited by pain Patient left: in bed;with call bell/phone within reach;with bed alarm set Nurse Communication: Mobility status PT Visit Diagnosis: Other abnormalities of gait and mobility (R26.89);Muscle weakness (generalized)  (M62.81);Difficulty in walking, not elsewhere classified (R26.2);Pain     Time: 8972-8958 PT Time Calculation (min) (ACUTE ONLY): 14 min  Charges:    $Therapeutic Activity: 8-22 mins PT General Charges $$ ACUTE PT VISIT: 1 Visit                     Maejor Erven, PT, GCS 05/17/2024,12:58 PM

## 2024-05-17 NOTE — Progress Notes (Signed)
 Occupational Therapy Treatment Patient Details Name: Cynthia Singh MRN: [id number] DOB: [birth year] Today's Date: 05/17/2024   History of present illness Pt is a 83 y.o. female who presents with left flank pain and rectal bleeding, CT angio GI bleed scan, showed likely renal abscess, possible proctitis. Hospitalized 2  weeks ago and found to have subacute vertebral fracture L1, T5, T12, no neurosurgical intervention, TLSO brace when OOB, ok to take off with shower. PMH of CAD, MI, PVD, HTN, HLD, DM, dCHF, GERD, depression, CKD 3B, vertigo, left bundle blockade, chronic back pain with narcotic dependency   OT comments  Cynthia Singh was seen for OT/PT co-treatment on this date. Upon arrival to room pt supine in bed, agreeable to tx session. OT facilitated ADL management with education and assistance as described below. See ADL section for additional details regarding occupational performance. Pt continues to be functionally limited by increased pain with mobility, generalized weakness, poor balance, and poor activity tolerance. Pt return verbalizes understanding of education provided t/o session. Pt is progressing toward OT goals and continues to benefit from skilled OT services to maximize return to PLOF and minimize risk of future falls, injury, and readmission. Will continue to follow POC as written. Discharge recommendation remains appropriate.        If plan is discharge home, recommend the following:  A lot of help with walking and/or transfers;A lot of help with bathing/dressing/bathroom   Equipment Recommendations  Other (comment) (defer)    Recommendations for Other Services      Precautions / Restrictions Precautions Precautions: Fall;Back Recall of Precautions/Restrictions: Impaired Spinal Brace: Thoracolumbosacral orthotic Spinal Brace Comments: patient apparently has TLSO for use when OOB per admission 1 week ago. Restrictions Weight Bearing Restrictions Per Provider Order: No        Mobility Bed Mobility Overal bed mobility: Needs Assistance Bed Mobility: Rolling, Sidelying to Sit, Sit to Sidelying Rolling: Supervision Sidelying to sit: Min assist, HOB elevated, Used rails       General bed mobility comments: increased time and cues for comfort to log roll MIN A for trunk elevation and to advance hips.    Transfers Overall transfer level: Needs assistance Equipment used: Rolling walker (2 wheels) Transfers: Sit to/from Stand Sit to Stand: Contact guard assist, +2 physical assistance           General transfer comment: CGA +2 with RW to take a few effortful side steps toward HOB.     Balance                                           ADL either performed or assessed with clinical judgement   ADL Overall ADL's : Needs assistance/impaired     Grooming: Sitting;Supervision/safety;Set up;Wash/dry face Grooming Details (indicate cue type and reason): Sitting EOB, fatigues quickly                             Functional mobility during ADLs: Contact guard assist;Rolling walker (2 wheels);+2 for physical assistance General ADL Comments: +2 CGA to take side steps toward HOB. Pt declines to use TLSO for brief mobility, discussed importance of following MD's orders for safety and recovery, especially with longer functional transfers. Return verbalizes understanding.    Extremity/Trunk Assessment              Vision Patient Visual Report: No  change from baseline     Perception     Praxis     Communication Communication Communication: No apparent difficulties   Cognition Arousal: Alert Behavior During Therapy: WFL for tasks assessed/performed Cognition: No family/caregiver present to determine baseline             OT - Cognition Comments: Generally able to follow VCs, oriented to self and place.                 Following commands: Intact Following commands impaired: Follows one step commands  with increased time      Cueing   Cueing Techniques: Verbal cues, Gestural cues  Exercises Other Exercises Other Exercises: OT facilitated ADL management with educ and assist as described above.    Shoulder Instructions       General Comments      Pertinent Vitals/ Pain          Home Living                                          Prior Functioning/Environment              Frequency  Min 2X/week        Progress Toward Goals  OT Goals(current goals can now be found in the care plan section)  Progress towards OT goals: Progressing toward goals  Acute Rehab OT Goals Patient Stated Goal: improve pain OT Goal Formulation: With patient Time For Goal Achievement: 05/30/24 Potential to Achieve Goals: Fair  Plan      Co-evaluation      Reason for Co-Treatment: To address functional/ADL transfers PT goals addressed during session: Mobility/safety with mobility OT goals addressed during session: ADL's and self-care      AM-PAC OT 6 Clicks Daily Activity     Outcome Measure   Help from another person eating meals?: A Little Help from another person taking care of personal grooming?: A Little Help from another person toileting, which includes using toliet, bedpan, or urinal?: A Lot Help from another person bathing (including washing, rinsing, drying)?: A Lot Help from another person to put on and taking off regular upper body clothing?: A Little Help from another person to put on and taking off regular lower body clothing?: A Lot 6 Click Score: 15    End of Session    OT Visit Diagnosis: Unsteadiness on feet (R26.81);Other abnormalities of gait and mobility (R26.89);Muscle weakness (generalized) (M62.81);History of falling (Z91.81)   Activity Tolerance Patient limited by pain   Patient Left in bed;with call bell/phone within reach;with bed alarm set   Nurse Communication          Time: 8972-8958 OT Time Calculation (min): 14  min  Charges: OT General Charges $OT Visit: 1 Visit  Jhonny Pelton, M.S., OTR/L 05/17/2024, 12:57 PM

## 2024-05-18 DIAGNOSIS — N151 Renal and perinephric abscess: Secondary | ICD-10-CM | POA: Diagnosis not present

## 2024-05-18 LAB — CBC
HCT: 29.1 % — ABNORMAL LOW (ref 36.0–46.0)
Hemoglobin: 9.2 g/dL — ABNORMAL LOW (ref 12.0–15.0)
MCH: 28 pg (ref 26.0–34.0)
MCHC: 31.6 g/dL (ref 30.0–36.0)
MCV: 88.7 fL (ref 80.0–100.0)
Platelets: 311 K/uL (ref 150–400)
RBC: 3.28 MIL/uL — ABNORMAL LOW (ref 3.87–5.11)
RDW: 14.7 % (ref 11.5–15.5)
WBC: 9.5 K/uL (ref 4.0–10.5)
nRBC: 0 % (ref 0.0–0.2)

## 2024-05-18 LAB — BASIC METABOLIC PANEL WITH GFR
Anion gap: 10 (ref 5–15)
BUN: 24 mg/dL — ABNORMAL HIGH (ref 8–23)
CO2: 24 mmol/L (ref 22–32)
Calcium: 8.6 mg/dL — ABNORMAL LOW (ref 8.9–10.3)
Chloride: 100 mmol/L (ref 98–111)
Creatinine, Ser: 2.14 mg/dL — ABNORMAL HIGH (ref 0.44–1.00)
GFR, Estimated: 23 mL/min — ABNORMAL LOW
Glucose, Bld: 131 mg/dL — ABNORMAL HIGH (ref 70–99)
Potassium: 4.6 mmol/L (ref 3.5–5.1)
Sodium: 134 mmol/L — ABNORMAL LOW (ref 135–145)

## 2024-05-18 LAB — GLUCOSE, CAPILLARY
Glucose-Capillary: 79 mg/dL (ref 70–99)
Glucose-Capillary: 186 mg/dL — ABNORMAL HIGH (ref 70–99)
Glucose-Capillary: 178 mg/dL — ABNORMAL HIGH (ref 70–99)
Glucose-Capillary: 172 mg/dL — ABNORMAL HIGH (ref 70–99)

## 2024-05-18 LAB — URINE CULTURE: Culture: NO GROWTH

## 2024-05-18 LAB — SURGICAL PATHOLOGY

## 2024-05-18 MED ORDER — AMLODIPINE BESYLATE 5 MG PO TABS
5.0000 mg | ORAL_TABLET | Freq: Every day | ORAL | Status: DC
  Administered 2024-05-19: 5 mg via ORAL
  Filled 2024-05-18: qty 1

## 2024-05-18 MED ORDER — MINERAL OIL RE ENEM: 1.0000 | ENEMA | Freq: Once | RECTAL | Status: DC

## 2024-05-18 MED ORDER — BISACODYL 10 MG RE SUPP
10.0000 mg | Freq: Once | RECTAL | Status: AC
  Administered 2024-05-18: 10 mg via RECTAL
  Filled 2024-05-18: qty 1

## 2024-05-18 MED ORDER — SODIUM CHLORIDE 0.9 % IV SOLN: INTRAVENOUS | Status: DC

## 2024-05-18 NOTE — Plan of Care (Signed)
  Problem: Education: Goal: Ability to describe self-care measures that may prevent or decrease complications (Diabetes Survival Skills Education) will improve Outcome: Progressing   Problem: Coping: Goal: Ability to adjust to condition or change in health will improve Outcome: Progressing   Problem: Pain Managment: Goal: General experience of comfort will improve and/or be controlled Outcome: Progressing   Problem: Safety: Goal: Ability to remain free from injury will improve Outcome: Progressing

## 2024-05-18 NOTE — Progress Notes (Signed)
 " Progress Note   Patient: Cynthia Singh FMW:[id number] DOB: 1940/08/27 DOA: 05/14/2024     4 DOS: the patient was seen and examined on 05/18/2024   Brief hospital course: Cynthia Singh is a 84 y.o. female with medical history significant of CAD, MI, PVD, HTN, HLD, DM, dCHF, GERD, depression, CKD 3B, vertigo, left bundle blockade, chronic back pain with narcotic dependency, who presents with left flank pain and rectal bleeding.  Patient had CT angio GI bleed scan, showed likely renal abscess, possible proctitis. Patient was placed on Zosyn , blood culture was sent out.    Principal Problem:   Renal abscess, left Active Problems:   Rectal bleeding   Proctitis   HTN (hypertension)   CAD (coronary artery disease)   HLD (hyperlipidemia)   Chronic diastolic CHF (congestive heart failure) (HCC)   Type II diabetes mellitus with renal manifestations (HCC)   Chronic kidney disease, stage 3b (HCC)   Chronic narcotic dependence (HCC)   Depression   Assessment and Plan:  # Left renal abscess. Patient does not have sepsis.   She was recently in the hospital treated for E. coli urinary tract infection with bacteremia.  Repeat blood culture at that time was negative.  Presumed to be the same bacteria causing her renal abscess.  Based on prior culture, Rocephin  is a good choice.  Blood culture was not sent prior to antibiotics, I will resend blood culture today. Patient can be discharged home once culture results available. Patient also seen by urology, no surgery required.  Recommended follow-up with urology as outpatient. Urine culture came back with multiple species, recommend resent.  Urine culture sent again 12/30. 12/30 urine culture no growth   Rectal bleeding due to proctitis, rectal source most likely due to constipation, stercoral proctitis Bleeding has stopped CT angio did not show any active bleeding.   Continue Zosyn   12/30 S/p colonoscopy: Poor prep, multiple rectal ulcers,  clips placed and biopsy done.  GI recommended to start DAPT if no active bleeding on 12/31 Recommended MiraLAX  and Colace twice a day, will benefit from opioid-induced constipation Amitiza.  Follow pathology    Essential hypertension  Current home medicines.   Coronary artery disease  Chronic diastolic congestive heart failure. No exacerbation. 12/31 resumed aspirin  and Plavix , cleared by GI No active bleeding Monitor H&H  AKI Chronic kidney disease stage IIIb. US  renal:  Renal parenchyma is somewhat hyperechoic bilaterally. This is nonspecific, but can be seen in the setting of medical renal disease. Baseline creatinine 1.54 sCr 2.15 elevated Started IV fluid for hydration   Urinary retention, Foley catheter was inserted on 12/28 In-N-Out cath was done and 99 mL urine was collected followed by Foley catheter insertion and >800 ml urine was collected Continue Foley catheter for 1 week  Constipation, continue laxatives 1/1 Dulcolax suppository x 1 dose ordered.  Subjective:  No significant events overnight.  Patient stated that she is not feeling good because she had a headache, patient was advised to take Tylenol .  Patient did sleep good last night.  Denied any other specific complaints, no active bleeding.  Has not had a BM in past 2 days.  Agreed for suppository.   Physical Exam: Vitals:   05/18/24 0337 05/18/24 0504 05/18/24 0810 05/18/24 1622  BP:  (!) 147/86 (!) 164/64 128/64  Pulse:  73 77 69  Resp:  17 18 16   Temp:  97.9 F (36.6 C) 98 F (36.7 C) 98.1 F (36.7 C)  TempSrc:  Oral  SpO2:  96% 98% 99%  Weight: 73.6 kg     Height:       General exam: Appears calm and comfortable  Respiratory system: Clear to auscultation. Respiratory effort normal. Cardiovascular system: S1 & S2 heard, RRR. No murmurs Gastrointestinal system: Abdomen is nondistended, soft and nontender. Normal bowel sounds heard. Central nervous system: Alert and oriented. No focal  neurological deficits. Extremities: No edema  Skin: No rashes, lesions or ulcers Psychiatry: Judgement and insight appear normal. Mood & affect appropriate.    Data Reviewed:  Results reviewed.  Family Communication: None  Disposition: Status is: Inpatient Remains inpatient appropriate because: Severity of disease, IV treatment.     Time spent: 35 minutes  Author: Elvan Sor, MD 05/18/2024 4:38 PM  For on call review [email or web address].    "

## 2024-05-18 NOTE — Plan of Care (Signed)

## 2024-05-18 NOTE — TOC Progression Note (Signed)
 Transition of Care [facility]) - Progression Note    Patient Details  Name: Cynthia Singh MRN: [id number] Date of Birth: [birth year]  Transition of Care [facility]) CM/SW Contact  Nathanael CHRISTELLA Ring, RN Phone Number: 05/18/2024, 9:26 AM  Clinical Narrative:    Insurance authorization approved- AuthID [id number], Reference # [id number] approved from 12/31- 1/5 next review 05/22/24.  Messaged [place] with [facility] to see if patient can go today.   Expected Discharge Plan: Skilled Nursing Facility Barriers to Discharge: Continued Medical Work up               Expected Discharge Plan and Services   Discharge Planning Services: CM Consult   Living arrangements for the past 2 months: Single Family Home                 DME Arranged: N/A         HH Arranged: NA           Social Drivers of Health (SDOH) Interventions SDOH Screenings   Food Insecurity: No Food Insecurity (05/14/2024)  Housing: Low Risk (05/14/2024)  Transportation Needs: No Transportation Needs (05/14/2024)  Utilities: Not At Risk (05/14/2024)  Depression (PHQ2-9): Medium Risk (02/16/2024)  Financial Resource Strain: Medium Risk (06/24/2021)  Social Connections: Unknown (05/15/2024)  Recent Concern: Social Connections - Socially Isolated (05/14/2024)  Tobacco Use: Low Risk (05/16/2024)    Readmission Risk Interventions    05/15/2024    4:32 PM 05/01/2024   11:10 AM 05/02/2023   11:52 AM  Readmission Risk Prevention Plan  Transportation Screening Complete Complete Complete  PCP or Specialist Appt within 5-7 Days Complete  Complete  PCP or Specialist Appt within 3-5 Days  Complete   Home Care Screening Complete  Complete  Medication Review (RN CM) Complete  Complete  HRI or Home Care Consult  Complete   Palliative Care Screening  Not Applicable   Medication Review (RN Care Manager)  Complete

## 2024-05-18 NOTE — Progress Notes (Signed)
 Mobility Specialist Progress Note:    05/18/24 1343  Mobility  Activity Stood at bedside  Level of Assistance Minimal assist, patient does 75% or more  Assistive Device Front wheel walker;Other (Comment) (TLSO)  Range of Motion/Exercises Active;All extremities  Activity Response Tolerated well  Mobility visit 1 Mobility  Mobility Specialist Start Time (ACUTE ONLY) 1330  Mobility Specialist Stop Time (ACUTE ONLY) 1343  Mobility Specialist Time Calculation (min) (ACUTE ONLY) 13 min   Pt received in bed, agreeable to mobility. TLSO donned prior to mobility. Required MinA to stand EOB with RW. Tolerated well, c/o back pain. Returned pt fowlers, alarm on and belongings in reach. All needs met.  Sherrilee Ditty Mobility Specialist Please contact via Special Educational Needs Teacher or  Rehab office at [phone]

## 2024-05-19 ENCOUNTER — Ambulatory Visit: Admitting: Nurse Practitioner

## 2024-05-19 DIAGNOSIS — N151 Renal and perinephric abscess: Secondary | ICD-10-CM | POA: Diagnosis not present

## 2024-05-19 LAB — CULTURE, BLOOD (ROUTINE X 2): Special Requests: ADEQUATE

## 2024-05-19 LAB — BASIC METABOLIC PANEL WITH GFR
Anion gap: 11 (ref 5–15)
BUN: 25 mg/dL — ABNORMAL HIGH (ref 8–23)
CO2: 22 mmol/L (ref 22–32)
Calcium: 8.5 mg/dL — ABNORMAL LOW (ref 8.9–10.3)
Chloride: 102 mmol/L (ref 98–111)
Creatinine, Ser: 2.15 mg/dL — ABNORMAL HIGH (ref 0.44–1.00)
GFR, Estimated: 22 mL/min — ABNORMAL LOW
Glucose, Bld: 88 mg/dL (ref 70–99)
Potassium: 4.3 mmol/L (ref 3.5–5.1)
Sodium: 136 mmol/L (ref 135–145)

## 2024-05-19 LAB — GLUCOSE, CAPILLARY
Glucose-Capillary: 106 mg/dL — ABNORMAL HIGH (ref 70–99)
Glucose-Capillary: 159 mg/dL — ABNORMAL HIGH (ref 70–99)
Glucose-Capillary: 163 mg/dL — ABNORMAL HIGH (ref 70–99)

## 2024-05-19 LAB — CBC
HCT: 29 % — ABNORMAL LOW (ref 36.0–46.0)
Hemoglobin: 9 g/dL — ABNORMAL LOW (ref 12.0–15.0)
MCH: 27.9 pg (ref 26.0–34.0)
MCHC: 31 g/dL (ref 30.0–36.0)
MCV: 89.8 fL (ref 80.0–100.0)
Platelets: 334 K/uL (ref 150–400)
RBC: 3.23 MIL/uL — ABNORMAL LOW (ref 3.87–5.11)
RDW: 14.9 % (ref 11.5–15.5)
WBC: 9.5 K/uL (ref 4.0–10.5)
nRBC: 0 % (ref 0.0–0.2)

## 2024-05-19 MED ORDER — AMOXICILLIN-POT CLAVULANATE 500-125 MG PO TABS: 1.0000 | ORAL_TABLET | Freq: Two times a day (BID) | ORAL | Status: AC

## 2024-05-19 MED ORDER — BISACODYL 5 MG PO TBEC: 10.0000 mg | DELAYED_RELEASE_TABLET | Freq: Every day | ORAL | Status: AC

## 2024-05-19 MED ORDER — AMOXICILLIN-POT CLAVULANATE 875-125 MG PO TABS: 1.0000 | ORAL_TABLET | Freq: Two times a day (BID) | ORAL | 0 refills | Status: DC

## 2024-05-19 MED ORDER — AMLODIPINE BESYLATE 5 MG PO TABS: 5.0000 mg | ORAL_TABLET | Freq: Every day | ORAL | Status: AC

## 2024-05-19 MED ORDER — PANTOPRAZOLE SODIUM 40 MG PO TBEC: 40.0000 mg | DELAYED_RELEASE_TABLET | Freq: Two times a day (BID) | ORAL | Status: DC

## 2024-05-19 MED ORDER — HYDRALAZINE HCL 50 MG PO TABS: 50.0000 mg | ORAL_TABLET | Freq: Four times a day (QID) | ORAL | Status: DC | PRN

## 2024-05-19 MED ORDER — HYDRALAZINE HCL 20 MG/ML IJ SOLN: 10.0000 mg | Freq: Four times a day (QID) | INTRAMUSCULAR | Status: DC | PRN

## 2024-05-19 MED ORDER — PANTOPRAZOLE SODIUM 40 MG PO TBEC: 40.0000 mg | DELAYED_RELEASE_TABLET | Freq: Every day | ORAL | Status: AC

## 2024-05-19 MED ORDER — BISACODYL 10 MG RE SUPP: 10.0000 mg | Freq: Every day | RECTAL | Status: AC | PRN

## 2024-05-19 MED ORDER — OXYCODONE-ACETAMINOPHEN 10-325 MG PO TABS: 1.0000 | ORAL_TABLET | Freq: Four times a day (QID) | ORAL | 0 refills | Status: AC | PRN

## 2024-05-19 MED ORDER — HYDRALAZINE HCL 50 MG PO TABS: 50.0000 mg | ORAL_TABLET | Freq: Four times a day (QID) | ORAL | Status: AC | PRN

## 2024-05-19 MED ORDER — SODIUM CHLORIDE 0.9 % IV SOLN: INTRAVENOUS | Status: DC

## 2024-05-19 MED ORDER — POLYETHYLENE GLYCOL 3350 17 G PO PACK: 17.0000 g | PACK | Freq: Two times a day (BID) | ORAL | Status: AC

## 2024-05-19 NOTE — TOC Transition Note (Signed)
 Transition of Care [facility]) - Discharge Note   Patient Details  Name: Cynthia Singh MRN: [id number] Date of Birth: 04-Aug-1940  Transition [facility] Care [facility]) CM/SW Contact:  Nathanael CHRISTELLA Ring, RN Phone Number: 05/19/2024, 3:37 PM   Clinical Narrative:    Patient is discharging to [facility] today, going to room 36B.  Bedside RN will call report to [phone].  Transport has been arranged with [facility], patient is 5th in line so it will be a while before transfer.   CM talked to son Sherida updated on plan for discharge today, he asked that we make sure the TLSO brace goes with her at discharge and he also asks that she wear it during transport.  Will relay this information to the nurse.     Final next level of care: Skilled Nursing Facility Barriers to Discharge: Barriers Resolved   Patient Goals and CMS Choice Patient states their goals for this hospitalization and ongoing recovery are:: Agrees to SNF, understands she can't [facility].gov Compare Post Acute Care list provided to:: Patient Choice offered to / list presented to : Patient      Discharge Placement   Existing PASRR number confirmed : 05/16/24          Patient chooses bed at: [facility] Patient to be transferred to facility by: [facility] Name of family member notified: Billy Patient and family notified of of transfer: 05/19/24  Discharge Plan and Services Additional resources added to the After Visit Summary for     Discharge Planning Services: CM Consult            DME Arranged: N/A         HH Arranged: NA          Social Drivers of Health (SDOH) Interventions SDOH Screenings   Food Insecurity: No Food Insecurity (05/14/2024)  Housing: Low Risk (05/14/2024)  Transportation Needs: No Transportation Needs (05/14/2024)  Utilities: Not At Risk (05/14/2024)  Depression (PHQ2-9): Medium Risk (02/16/2024)  Financial Resource Strain: Medium Risk (06/24/2021)  Social  Connections: Unknown (05/15/2024)  Recent Concern: Social Connections - Socially Isolated (05/14/2024)  Tobacco Use: Low Risk (05/16/2024)     Readmission Risk Interventions    05/15/2024    4:32 PM 05/01/2024   11:10 AM 05/02/2023   11:52 AM  Readmission Risk Prevention Plan  Transportation Screening Complete Complete Complete  PCP or Specialist Appt within 5-7 Days Complete  Complete  PCP or Specialist Appt within 3-5 Days  Complete   Home Care Screening Complete  Complete  Medication Review (RN CM) Complete  Complete  HRI or Home Care Consult  Complete   Palliative Care Screening  Not Applicable   Medication Review (RN Care Manager)  Complete

## 2024-05-19 NOTE — Plan of Care (Signed)

## 2024-05-19 NOTE — Discharge Summary (Signed)
 [facility] Discharge Summary   Patient: Cynthia Singh FMW:[id number]  PCP: Wendee Lynwood HERO, NP  Date of admission: 05/14/2024   Date of discharge:  05/19/2024     Discharge Diagnoses:  Principal Problem:   Renal abscess, left Active Problems:   Rectal bleeding   Proctitis   HTN (hypertension)   CAD (coronary artery disease)   HLD (hyperlipidemia)   Chronic diastolic CHF (congestive heart failure) (HCC)   Type II diabetes mellitus with renal manifestations (HCC)   Chronic kidney disease, stage 3b (HCC)   Chronic narcotic dependence (HCC)   Depression   Admitted From: Home Disposition:  SNF   Recommendations for Outpatient Follow-up:  Follow-up with PCP, patient to be seen by an MD in 1 to 2 days, monitor BP and added medication accordingly. Repeat BMP to check renal functions in 1 week Discontinue Foley catheter in few days and follow voiding trial at SNF Follow-up with urology in 1 week Follow-up with GI in 1 week, biopsy report is pending Follow up LABS/TEST:  As above   Contact information for follow-up providers     Stoioff, Glendia BROCKS, MD Follow up in 1 week(s).   Specialty: Urology Contact information: [address] [place] [address] [phone]         Onita Elspeth Sharper, DO Follow up in 1 week(s).   Specialty: Gastroenterology Contact information: [address] [facility] [place] [address] [phone]              Contact information for after-discharge care     Destination     [facility] .   Service: Skilled Nursing Contact information: [address] [place]  [address] [phone]                    Diet recommendation: Cardiac and Carb modified diet  Activity: The patient is advised to gradually reintroduce usual activities, as tolerated  Discharge Condition: stable  Code Status: Full code   History of present illness: As per the H and P  dictated on admission.  Hospital Course:  Cynthia Singh is a 84 y.o. female with medical history significant of CAD, MI, PVD, HTN, HLD, DM, dCHF, GERD, depression, CKD 3B, vertigo, left bundle blockade, chronic back pain with narcotic dependency, who presents with left flank pain and rectal bleeding.  Patient had CT angio GI bleed scan, showed likely renal abscess, possible proctitis. Patient was placed on Zosyn , blood culture was sent out.   Assessment and Plan:   # Left renal abscess. Patient does not have sepsis.   She was recently in the hospital treated for E. coli urinary tract infection with bacteremia.  Repeat blood culture at that time was negative.  Presumed to be the same bacteria causing her renal abscess.  Based on prior culture, Rocephin  is a good choice.  Blood culture was not sent prior to antibiotics, but I was sent on 12/30, NGTD. Patient also seen by urology, no surgery required.  Recommended follow-up with urology as outpatient, continue antibiotics for total 2 weeks. Urine culture came back with multiple species, recommend resent.  Urine culture sent again 12/30, Ucx: no growth Patient was discharged on Augmentin twice daily for 10 more days per Follow-up with urology in 1 week  # Rectal bleeding due to proctitis, rectal source most likely due to constipation, stercoral proctitis: Bleeding has stopped CT angio did not show any active bleeding. S/p Zosyn  given during hospital stay  and discharged on Augmentin as above 12/30 S/p colonoscopy: Poor prep, multiple rectal ulcers, clips placed and biopsy done.  GI recommended to start DAPT if no active bleeding on 12/31 Recommended MiraLAX  and Colace twice a day, will benefit from opioid-induced constipation Amitiza.  Follow pathology Patient was discharged on MiraLAX  twice daily, Dulcolax 10 mg p.o. nightly and Dulcolax suppositories as needed.  Titrate laxatives accordingly and may use enema if needed for severe constipation    # Essential hypertension: Continue amlodipine  5 mg, Hydralazine  as needed.  Monitor BP and titrate medications accordingly   # Coronary artery disease  # Chronic diastolic congestive heart failure. No exacerbation. On 12/31 resumed aspirin  and Plavix , cleared by GI No active bleeding. H&H remained stable, repeat CBC after 1 week   # AKI on Chronic kidney disease stage IIIb. US  renal:  Renal parenchyma is somewhat hyperechoic bilaterally. This is nonspecific, but can be seen in the setting of medical renal disease. Baseline creatinine 1.54 sCr 2.15 elevated.  Continue oral hydration.  Repeat BMP after 1 week   Urinary retention, Foley catheter was inserted on 12/28 In-N-Out cath was done and 99 mL urine was collected followed by Foley catheter insertion and >800 ml urine was collected Continue Foley catheter for 1 week, follow-up with urology for voiding trial or it can be discontinued after few days had significant check bladder scan as per protocol   Constipation, continue laxatives 1/1 Dulcolax suppository x 1 dose given and patient is moving bowels   Body mass index is 25.51 kg/m.  Nutrition Interventions:   Pain control  - [place]  Controlled Substance Reporting System database could not be reviewed as website was not working -Percocet 10-325 mg p.o. Q6 hourly prn, 10 tablets prescription given as per SNF requirement. - Patient was instructed, not to drive, operate heavy machinery, perform activities at heights, swimming or participation in water activities or provide baby sitting services while on Pain, Sleep and Anxiety Medications; until her outpatient Physician has advised to do so again.  - Also recommended to not to take more than prescribed Pain, Sleep and Anxiety Medications.  Patient was seen by physical therapy, who recommended Therapy, SNF placement, which was arranged. On the day of the discharge the patient's vitals were stable, and no other acute medical  condition were reported by patient. the patient was felt safe to be discharge at SNF with Therapy.  Consultants: Urology and GI Procedures: s/p colonoscopy, found to have rectal ulcers, biopsy taken.  Clips placed.  Follow-up biopsy report  Discharge Exam: General: Appear in no distress, Oral Mucosa Clear, moist. Cardiovascular: S1 and S2 Present, no Murmur, Respiratory: normal respiratory effort, Bilateral Air entry present and no Crackles, no wheezes Abdomen: Bowel Sound present, Soft and no tenderness. Extremities: no Pedal edema, no calf tenderness Neurology: alert and oriented to time, place, and person affect appropriate.  Filed Weights   05/17/24 0359 05/18/24 0337 05/19/24 0500  Weight: 74.3 kg 73.6 kg 76.1 kg   Vitals:   05/19/24 0447 05/19/24 0809  BP: (!) 172/67 (!) 161/56  Pulse: 73 73  Resp: 20 16  Temp: 97.9 F (36.6 C) 99.7 F (37.6 C)  SpO2: 96% 99%    DISCHARGE MEDICATION: Allergies as of 05/19/2024   No Known Allergies      Medication List     STOP taking these medications    senna 8.6 MG Tabs tablet Commonly known as: SENOKOT       TAKE these medications  amLODipine  5 MG tablet Commonly known as: NORVASC  Take 1 tablet (5 mg total) by mouth daily. Hold off if SBP <130 mmHg What changed:  medication strength how much to take additional instructions   amoxicillin -clavulanate 500-125 MG tablet Commonly known as: Augmentin Take 1 tablet by mouth in the morning and at bedtime for 10 days.   aspirin  EC 81 MG tablet Take 81 mg by mouth daily.   atorvastatin  40 MG tablet Commonly known as: LIPITOR  TAKE 1 TABLET BY MOUTH EVERY DAY   bisacodyl 5 MG EC tablet Commonly known as: DULCOLAX Take 2 tablets (10 mg total) by mouth at bedtime.   bisacodyl 10 MG suppository Commonly known as: DULCOLAX Place 1 suppository (10 mg total) rectally daily as needed for severe constipation.   clopidogrel  75 MG tablet Commonly known as: PLAVIX  TAKE 1  TABLET BY MOUTH EVERY DAY   DULoxetine  30 MG capsule Commonly known as: CYMBALTA  TAKE 1 CAPSULE BY MOUTH EVERY DAY   empagliflozin  25 MG Tabs tablet Commonly known as: Jardiance  Take 1 tablet (25 mg total) by mouth daily before breakfast.   hydrALAZINE  50 MG tablet Commonly known as: APRESOLINE  Take 1 tablet (50 mg total) by mouth every 6 (six) hours as needed (SBP >150).   Lantus  SoloStar 100 UNIT/ML Solostar Pen Generic drug: insulin  glargine Inject 40 Units into the skin daily. TAKE 40 UNITS DAILY UNDER SKIN   meclizine  12.5 MG tablet Commonly known as: ANTIVERT  Take 1-2 tablets (12.5-25 mg total) by mouth 3 (three) times daily as needed for dizziness. What changed: how much to take   metFORMIN  500 MG 24 hr tablet Commonly known as: GLUCOPHAGE -XR TAKE 2 TABLETS (1,000 MG TOTAL) BY MOUTH IN THE MORNING AND AT BEDTIME.   nitroGLYCERIN  0.4 MG SL tablet Commonly known as: Nitrostat  Place 1 tablet (0.4 mg total) under the tongue every 5 (five) minutes as needed for chest pain.   nortriptyline  50 MG capsule Commonly known as: PAMELOR  Take 3 capsules (150 mg total) by mouth at bedtime.   oxyCODONE -acetaminophen  10-325 MG tablet Commonly known as: PERCOCET Take 1 tablet by mouth every 6 (six) hours as needed for pain.   pantoprazole  40 MG tablet Commonly known as: PROTONIX  Take 1 tablet (40 mg total) by mouth daily.   Pen Needles 32G X 5 MM Misc 1 Units by Does not apply route daily.   polyethylene glycol 17 g packet Commonly known as: MIRALAX  / GLYCOLAX  Take 17 g by mouth 2 (two) times daily. What changed:  when to take this reasons to take this       Allergies[1] Discharge Instructions     Call MD for:   Complete by: As directed    Rectal bleeding   Call MD for:  difficulty breathing, headache or visual disturbances   Complete by: As directed    Call MD for:  extreme fatigue   Complete by: As directed    Call MD for:  persistant dizziness or  light-headedness   Complete by: As directed    Call MD for:  persistant nausea and vomiting   Complete by: As directed    Call MD for:  severe uncontrolled pain   Complete by: As directed    Call MD for:  temperature >100.4   Complete by: As directed    Diet Carb Modified   Complete by: As directed    Discharge instructions   Complete by: As directed    Follow-up with PCP, patient to be seen by an  MD in 1 to 2 days, monitor BP and added medication accordingly. Repeat BMP to check renal functions in 1 week Discontinue Foley catheter in few days and follow voiding trial at SNF Follow-up with urology in 1 week Follow-up with GI in 1 week, biopsy report is pending   Increase activity slowly   Complete by: As directed        The results of significant diagnostics from this hospitalization (including imaging, microbiology, ancillary and laboratory) are listed below for reference.    Significant Diagnostic Studies: US  RENAL Result Date: 05/18/2024 EXAM: US  Retroperitoneum Complete, Renal. 05/17/2024 02:44:35 PM TECHNIQUE: Real-time ultrasonography of the retroperitoneum renal was performed. COMPARISON: None available CLINICAL HISTORY: Acute kidney injury. FINDINGS: FINDINGS: The renal parenchyma is isoechoic to the index organs, the liver and spleen. RIGHT KIDNEY/URETER: Right kidney measures 11.2 x 4.3 x 4.4 cm. No hydronephrosis. No calculus. No mass. LEFT KIDNEY/URETER: Left kidney measures 12.4 x 6.1 x 4.7 cm. No hydronephrosis. No calculus. No mass. BLADDER: A foley catheter is in place. Unremarkable appearance of the bladder. IMPRESSION: 1. Renal parenchyma is somewhat hyperechoic bilaterally. This is nonspecific, but can be seen in the setting of medical renal disease. 2. Foley catheter in situ Electronically signed by: Lonni Necessary MD 05/18/2024 08:41 AM EST RP Workstation: [id number]   CT ANGIO GI BLEED Result Date: 05/14/2024 EXAM: CTA ABDOMEN AND PELVIS WITH CONTRAST  05/14/2024 02:37:31 PM TECHNIQUE: CTA images of the abdomen and pelvis with 75 mL of iohexol  (OMNIPAQUE ) 350 MG/ML intravenous contrast. Three-dimensional MIP/volume rendered formations were performed. Automated exposure control, iterative reconstruction, and/or weight based adjustment of the mA/kV was utilized to reduce the radiation dose to as low as reasonably achievable. COMPARISON: CT lumbar spine 12/125, CT abdomen and pelvis 04/28/24 CLINICAL HISTORY: lower GI bleeding, eval active extrav. slow oozing of dark red per rectum on exam FINDINGS: VASCULATURE: GI BLEED: No extravasation of intravenous contrast within the lumen of the bowel. AORTA: No acute finding. No abdominal aortic aneurysm. No dissection. CELIAC TRUNK: No acute finding. No occlusion or significant stenosis. SUPERIOR MESENTERIC ARTERY: No acute finding. No occlusion or significant stenosis. INFERIOR MESENTERIC ARTERY: No acute finding. No occlusion or significant stenosis. RENAL ARTERIES: No acute finding. No occlusion or significant stenosis. ILIAC ARTERIES: No acute finding. No occlusion or significant stenosis. ABDOMEN/PELVIS: LOWER CHEST: Visualized portion of the lower chest demonstrates no acute abnormality. LIVER: The liver is unremarkable. GALLBLADDER AND BILE DUCTS: Gallbladder not visualized and likely surgically absent. No biliary ductal dilatation. SPLEEN: The spleen is unremarkable. PANCREAS: The pancreas is unremarkable. ADRENAL GLANDS: Bilateral adrenal glands demonstrate no acute abnormality. KIDNEYS, URETERS AND BLADDER: Marked urothelial thickening of the left renal pelvis. Associated left renal-sided nephrogram. Several lobulated fluid density lesions along the left lower renal pole measuring up to 3 cm ([phone]) that may represent developing renal cortical abscesses versus cysts. The right kidney enhances homogeneously. No nephroureterolithiasis. No hydroureteronephrosis. No perinephric or periureteral stranding. Urinary  bladder is distended with urine. GI AND BOWEL: Stomach and duodenal sweep demonstrate no acute abnormality. No extravasation of intravenous contrast within the lumen of the bowel. Normal medialized cecum. No small or large bowel thickening or dilatation. The appendix is not definitely identified with no inflammatory changes in the right lower quadrant to suggest acute appendicitis. Underdistended rectum with question rectal wall thickening with mild perirectal fat stranding. Stool throughout the ascending and transverse colon. There is no bowel obstruction. No abnormal bowel wall thickening or distension. REPRODUCTIVE: Reproductive organs are unremarkable. PERITONEUM  AND RETROPERITONEUM: No ascites or free air. LYMPH NODES: Prominent but nonenlarged perirectal lymph node (4:69). Chronic benign enlarged left aortic retroperitoneal lymph nodes. No lymphadenopathy. BONES AND SOFT TISSUES: Mitral annular calcification. Aortic valve replacement. Coronary artery calcifications. No acute abnormality of the bones. No acute soft tissue abnormality. IMPRESSION: 1. No CT evidence of active gastrointestinal hemorrhage. 2. Left nephrolithiasis with fluid density lesions that may represent developing left lower renal poll cortical abscesses. 3. Question proctitis with limited evaluation due to rectal under-distension. Recommend colonoscopy status post treatment and status post complete resolution of inflammatory changes to exclude an underlying lesion. 4. Other, non-acute and/or normal findings as above. Electronically signed by: Morgane Naveau MD 05/14/2024 04:24 PM EST RP Workstation: [id number]   ECHOCARDIOGRAM COMPLETE Result Date: 05/02/2024    ECHOCARDIOGRAM REPORT   Patient Name:   Santa A Wolfert Date of Exam: 05/02/2024 Medical Rec #:  [id number]        Height:       68.0 in Accession #:    [id number]       Weight:       179.9 lb Date of Birth:  [birth year]       BSA:          1.954 m Patient Age:    84 years          BP:           125/58 mmHg Patient Gender: F                HR:           88 bpm. Exam Location:  [facility] Procedure: 2D Echo, Cardiac Doppler, Color Doppler and Intracardiac            Opacification Agent (Both Spectral and Color Flow Doppler were            utilized during procedure). Indications:     Bacteremia  History:         Patient has prior history of Echocardiogram examinations, most                  recent 09/05/2019. CAD; Risk Factors:Diabetes, Dyslipidemia and                  Hypertension. CKD.  Sonographer:     Philomena Daring Referring Phys:  [id number] DAVID P FITZGERALD Diagnosing Phys: Lonni Hanson MD IMPRESSIONS  1. Left ventricular ejection fraction, by estimation, is 55 to 60%. The left ventricle has normal function. The left ventricle has no regional wall motion abnormalities. There is mild left ventricular hypertrophy. Left ventricular diastolic parameters are consistent with Grade I diastolic dysfunction (impaired relaxation).  2. Right ventricular systolic function is normal. The right ventricular size is normal. There is normal pulmonary artery systolic pressure.  3. The mitral valve is degenerative. No evidence of mitral valve regurgitation. Mild mitral stenosis. Moderate mitral annular calcification.  4. The aortic valve has an indeterminant number of cusps. There is mild calcification of the aortic valve. There is mild thickening of the aortic valve. Aortic valve regurgitation is not visualized. Aortic valve sclerosis/calcification is present, without any evidence of aortic stenosis.  5. The inferior vena cava is normal in size with greater than 50% respiratory variability, suggesting right atrial pressure of 3 mmHg. Conclusion(s)/Recommendation(s): No evidence of valvular vegetations on this transthoracic echocardiogram. Consider a transesophageal echocardiogram to exclude infective endocarditis if clinically indicated. FINDINGS  Left Ventricle: Left ventricular ejection fraction, by estimation,  is 55 to 60%.  The left ventricle has normal function. The left ventricle has no regional wall motion abnormalities. Definity  contrast agent was given IV to delineate the left ventricular  endocardial borders. The left ventricular internal cavity size was normal in size. There is mild left ventricular hypertrophy. Left ventricular diastolic parameters are consistent with Grade I diastolic dysfunction (impaired relaxation). Right Ventricle: The right ventricular size is normal. Right vetricular wall thickness was not well visualized. Right ventricular systolic function is normal. There is normal pulmonary artery systolic pressure. The tricuspid regurgitant velocity is 1.24 m/s, and with an assumed right atrial pressure of 3 mmHg, the estimated right ventricular systolic pressure is 9.2 mmHg. Left Atrium: Left atrial size was normal in size. Right Atrium: Right atrial size was normal in size. Pericardium: The pericardium was not well visualized. Mitral Valve: The mitral valve is degenerative in appearance. There is mild thickening of the mitral valve leaflet(s). Moderate mitral annular calcification. No evidence of mitral valve regurgitation. Mild mitral valve stenosis. The mean mitral valve gradient is 5.0 mmHg. Tricuspid Valve: The tricuspid valve is not well visualized. Tricuspid valve regurgitation is not demonstrated. Aortic Valve: The aortic valve has an indeterminant number of cusps. There is mild calcification of the aortic valve. There is mild thickening of the aortic valve. Aortic valve regurgitation is not visualized. Aortic valve sclerosis/calcification is present, without any evidence of aortic stenosis. Aortic valve mean gradient measures 6.0 mmHg. Aortic valve peak gradient measures 11.6 mmHg. Aortic valve area, by VTI measures 2.22 cm. Pulmonic Valve: The pulmonic valve was not well visualized. Pulmonic valve regurgitation is not visualized. No evidence of pulmonic stenosis. Aorta: The aortic root is  normal in size and structure. Venous: The inferior vena cava is normal in size with greater than 50% respiratory variability, suggesting right atrial pressure of 3 mmHg. IAS/Shunts: The interatrial septum was not well visualized.  LEFT VENTRICLE PLAX 2D LVIDd:         4.50 cm   Diastology LVIDs:         3.00 cm   LV e' medial:    4.24 cm/s LV PW:         1.20 cm   LV E/e' medial:  21.6 LV IVS:        1.30 cm   LV e' lateral:   6.74 cm/s LVOT diam:     2.10 cm   LV E/e' lateral: 13.6 LV SV:         70 LV SV Index:   36 LVOT Area:     3.46 cm  RIGHT VENTRICLE             IVC RV S prime:     17.50 cm/s  IVC diam: 1.70 cm TAPSE (M-mode): 2.9 cm LEFT ATRIUM             Index        RIGHT ATRIUM           Index LA diam:        3.20 cm 1.64 cm/m   RA Area:     13.40 cm LA Vol (A2C):   53.6 ml 27.43 ml/m  RA Volume:   29.80 ml  15.25 ml/m LA Vol (A4C):   34.0 ml 17.40 ml/m LA Biplane Vol: 42.8 ml 21.91 ml/m  AORTIC VALVE AV Area (Vmax):    2.36 cm AV Area (Vmean):   2.26 cm AV Area (VTI):     2.22 cm AV Vmax:  170.00 cm/s AV Vmean:          117.000 cm/s AV VTI:            0.317 m AV Peak Grad:      11.6 mmHg AV Mean Grad:      6.0 mmHg LVOT Vmax:         116.00 cm/s LVOT Vmean:        76.500 cm/s LVOT VTI:          0.203 m LVOT/AV VTI ratio: 0.64  AORTA Ao Root diam: 3.00 cm MITRAL VALVE                TRICUSPID VALVE MV Area (PHT): 4.29 cm     TR Peak grad:   6.2 mmHg MV Mean grad:  5.0 mmHg     TR Vmax:        124.00 cm/s MV Decel Time: 177 msec MV E velocity: 91.40 cm/s   SHUNTS MV A velocity: 149.00 cm/s  Systemic VTI:  0.20 m MV E/A ratio:  0.61         Systemic Diam: 2.10 cm Lonni Hanson MD Electronically signed by Lonni Hanson MD Signature Date/Time: 05/02/2024/5:22:15 PM    Final    US  Abdomen Limited RUQ (LIVER/GB) Result Date: 04/28/2024 EXAM: Right Upper Quadrant Abdominal Ultrasound 04/28/2024 02:34:52 PM TECHNIQUE: Real-time ultrasonography of the right upper quadrant of the  abdomen was performed. COMPARISON: CT abdomen and pelvis 04/28/2024. CLINICAL HISTORY: Elevated LFTs. FINDINGS: LIVER: The liver demonstrates normal echogenicity. No intrahepatic biliary ductal dilatation. No evidence of mass. BILIARY SYSTEM: No pericholecystic fluid or wall thickening. No cholelithiasis. Common bile duct is within normal limits measuring 8 mm. OTHER: No right upper quadrant ascites. IMPRESSION: 1. No acute findings. 2. Common bile duct measures 8 mm which is within normal limits . Electronically signed by: Greig Pique MD 04/28/2024 10:02 PM EST RP Workstation: [id number]   CT LUMBAR SPINE WO CONTRAST Result Date: 04/28/2024 EXAM: CT THORACIC AND LUMBAR SPINE WITHOUT INTRAVENOUS CONTRAST 04/28/2024 05:08:00 PM TECHNIQUE: CT of the thoracic and lumbar spine was performed without the administration of intravenous contrast. Multiplanar reformatted images are provided for review. Automated exposure control, iterative reconstruction, and/or weight based adjustment of the mA/kV was utilized to reduce the radiation dose to as low as reasonably achievable. Incidental adrenal and/or renal findings do not require follow up imaging. COMPARISON: Thoracic and lumbar spine radiographs 04/28/2024. CT abdomen and pelvis 04/28/2024. CTA chest 09/04/2019. CLINICAL HISTORY: fall FINDINGS: THORACIC SPINE: BONES AND ALIGNMENT: Exaggerated upper thoracic kyphosis. Mild upper thoracic levoscoliosis and mid to lower thoracic dextroscoliosis. No significant listhesis. Oblique nondisplaced fracture through the anterior aspect of the T5 vertebral body and superior endplate with involvement of anterior and right lateral osteophytes, new from 2021 but of indeterminate age as some margins appear more sclerotic/chronic while others are less well defined. No definite associated middle or posterior column fracture. Chronic mild T12 superior endplate compression fracture, unchanged from 2021. Ankylosis by bridging vertebral  osteophytes throughout the thoracic spine. Multilevel facet ankylosis as well. DEGENERATIVE CHANGES: No high-grade spinal canal stenosis. SOFT TISSUES: No acute abnormality. Aortic and coronary atherosclerosis. LUMBAR SPINE: BONES AND ALIGNMENT: 5 lumbar type vertebrae. Trace anterolisthesis of L4 and L5. Horizontal fracture through the superior half of the L1 vertebral body with 7 mm of distraction and with a nondisplaced fracture extending through the T12 posterior elements bilaterally. Some of the fracture margins are mildly sclerotic, and this may be subacute. Widespread posterior element  ankylosis in the lumbar spine. DEGENERATIVE CHANGES: At least mild spinal stenosis and mild to moderate left neural foraminal stenosis at L4-L5 due to disc bulging, endplate spurring, and severe facet hypertrophy. SOFT TISSUES: Likely mild paravertebral soft tissue swelling at L1. IMPRESSION: 1. Horizontal fracture through the superior half of the L1 vertebral body and T12 posterior elements, possibly subacute. 2. Oblique nondisplaced fracture through the anterior aspect of the T5 vertebral body, new from 2021 but of indeterminate age. 3. Widespread thoracic and lumbar ankylosis 4. Chronic T12 compression fracture. Electronically signed by: Dasie Hamburg MD 04/28/2024 06:37 PM EST RP Workstation: [id number]   CT THORACIC SPINE WO CONTRAST Result Date: 04/28/2024 EXAM: CT THORACIC AND LUMBAR SPINE WITHOUT INTRAVENOUS CONTRAST 04/28/2024 05:08:00 PM TECHNIQUE: CT of the thoracic and lumbar spine was performed without the administration of intravenous contrast. Multiplanar reformatted images are provided for review. Automated exposure control, iterative reconstruction, and/or weight based adjustment of the mA/kV was utilized to reduce the radiation dose to as low as reasonably achievable. Incidental adrenal and/or renal findings do not require follow up imaging. COMPARISON: Thoracic and lumbar spine radiographs 04/28/2024. CT  abdomen and pelvis 04/28/2024. CTA chest 09/04/2019. CLINICAL HISTORY: fall FINDINGS: THORACIC SPINE: BONES AND ALIGNMENT: Exaggerated upper thoracic kyphosis. Mild upper thoracic levoscoliosis and mid to lower thoracic dextroscoliosis. No significant listhesis. Oblique nondisplaced fracture through the anterior aspect of the T5 vertebral body and superior endplate with involvement of anterior and right lateral osteophytes, new from 2021 but of indeterminate age as some margins appear more sclerotic/chronic while others are less well defined. No definite associated middle or posterior column fracture. Chronic mild T12 superior endplate compression fracture, unchanged from 2021. Ankylosis by bridging vertebral osteophytes throughout the thoracic spine. Multilevel facet ankylosis as well. DEGENERATIVE CHANGES: No high-grade spinal canal stenosis. SOFT TISSUES: No acute abnormality. Aortic and coronary atherosclerosis. LUMBAR SPINE: BONES AND ALIGNMENT: 5 lumbar type vertebrae. Trace anterolisthesis of L4 and L5. Horizontal fracture through the superior half of the L1 vertebral body with 7 mm of distraction and with a nondisplaced fracture extending through the T12 posterior elements bilaterally. Some of the fracture margins are mildly sclerotic, and this may be subacute. Widespread posterior element ankylosis in the lumbar spine. DEGENERATIVE CHANGES: At least mild spinal stenosis and mild to moderate left neural foraminal stenosis at L4-L5 due to disc bulging, endplate spurring, and severe facet hypertrophy. SOFT TISSUES: Likely mild paravertebral soft tissue swelling at L1. IMPRESSION: 1. Horizontal fracture through the superior half of the L1 vertebral body and T12 posterior elements, possibly subacute. 2. Oblique nondisplaced fracture through the anterior aspect of the T5 vertebral body, new from 2021 but of indeterminate age. 3. Widespread thoracic and lumbar ankylosis 4. Chronic T12 compression fracture.  Electronically signed by: Dasie Hamburg MD 04/28/2024 06:37 PM EST RP Workstation: [id number]   CT CERVICAL SPINE WO CONTRAST Result Date: 04/28/2024 EXAM: CT CERVICAL SPINE WITHOUT CONTRAST 04/28/2024 05:08:00 PM TECHNIQUE: CT of the cervical spine was performed without the administration of intravenous contrast. Multiplanar reformatted images are provided for review. Automated exposure control, iterative reconstruction, and/or weight based adjustment of the mA/kV was utilized to reduce the radiation dose to as low as reasonably achievable. COMPARISON: CT cervical spine 04/30/2023 CLINICAL HISTORY: Fall FINDINGS: CERVICAL SPINE: BONES AND ALIGNMENT: Chronic straightening of the normal cervical lordosis. Slight left convex cervical curvature near the cervicothoracic junction. No significant listhesis. No acute fracture or suspicious lesion. DEGENERATIVE CHANGES: Spondylosis with large anterior vertebral osteophytes throughout the majority of the  cervical spine. Advanced multilevel facet arthrosis. Potentially moderate multilevel spinal stenosis. Moderate to severe bilateral neural foraminal stenosis at C3-C4. SOFT TISSUES: No prevertebral soft tissue swelling. IMPRESSION: 1. No acute cervical spine fracture or traumatic malalignment. 2. Diffuse cervical spondylosis and facet arthrosis. Electronically signed by: Dasie Hamburg MD 04/28/2024 06:13 PM EST RP Workstation: [id number]   DG Thoracic Spine 2 View Result Date: 04/28/2024 CLINICAL DATA:  Multiple fall EXAM: THORACIC SPINE 2 VIEWS COMPARISON:  CT 04/28/2024, chest CT 09/04/2019 FINDINGS: Scoliosis of the thoracic spine. Moderate superior endplate deformity at T12, probably chronic to the chest CT from 2021 but lucencies on today's CT in the region of the spinous process. Lucency at the superior aspect of L1 vertebral body. Remaining vertebra demonstrate grossly normal stature. Multilevel flowing osteophytosis. IMPRESSION: 1. Moderate superior endplate  deformity at T12, probably chronic to the chest CT from 2021 2. Suspected acute fracture involving L1 and posterior elements at T12. Dedicated thoracolumbar CT recommended for further assessment Electronically Signed   By: Luke Bun M.D.   On: 04/28/2024 16:14   DG Lumbar Spine 2-3 Views Result Date: 04/28/2024 CLINICAL DATA:  Multiple fall EXAM: LUMBAR SPINE - 2-3 VIEW COMPARISON:  CT 04/28/2024 FINDINGS: Lumbar alignment within normal limits. Aortic atherosclerosis. Abnormal lucency at the L1 vertebral body with superior endplate deformity. There is also additional superior endplate deformity at T12. On sagittal reconstructions of today CT, possible linear lucency at the spinous process of T12 and left posterior arch of T12. Moderate disc space narrowing L3-L4 and L5-S1. Prominent lower lumbar facet degenerative changes. IMPRESSION: 1. Abnormal lucency at the L1 vertebral body with superior endplate deformity, suspicious for acute fracture. Additional superior endplate deformity at T12, age indeterminate. Possible linear lucency at the spinous process of T12 and left posterior arch of T12, suspicious for acute fracture. Dedicated thoracolumbar CT recommended for further assessment. 2. Moderate degenerative changes of the lower lumbar spine. Electronically Signed   By: Luke Bun M.D.   On: 04/28/2024 16:12   DG Cervical Spine 2 or 3 views Result Date: 04/28/2024 CLINICAL DATA:  Multiple fall EXAM: CERVICAL SPINE - 2-3 VIEW COMPARISON:  04/12/2011, CT 01/28/2023 FINDINGS: Straightening of the cervical spine. Normal prevertebral soft tissue thickness. Dens and lateral masses are grossly within normal limits allowing for suboptimal positioning. Moderate diffuse disc space narrowing C3 through C7 with bulky multilevel osteophytes. IMPRESSION: Straightening of the cervical spine with moderate diffuse degenerative changes. Cross-sectional imaging follow-up if persistent concern for cervical fracture  Electronically Signed   By: Luke Bun M.D.   On: 04/28/2024 16:05   DG HIPS BILAT WITH PELVIS MIN 5 VIEWS Result Date: 04/28/2024 CLINICAL DATA:  Recent fall concern for fracture EXAM: DG HIP (WITH OR WITHOUT PELVIS) 5+V BILAT COMPARISON:  CT 04/28/2024 FINDINGS: SI joints are non widened. Pubic symphysis appears intact. No definitive fracture or malalignment. Prominent acetabular osteophytes. Mild bilateral hip degenerative change IMPRESSION: No acute osseous abnormality. Cross-sectional imaging follow-up if persistent concern for hip or pelvic fracture Electronically Signed   By: Luke Bun M.D.   On: 04/28/2024 16:03   CT HEAD WO CONTRAST ( ) Result Date: 04/28/2024 EXAM: CT HEAD WITHOUT 04/28/2024 03:06:26 PM TECHNIQUE: CT of the head was performed without the administration of intravenous contrast. Automated exposure control, iterative reconstruction, and/or weight based adjustment of the mA/kV was utilized to reduce the radiation dose to as low as reasonably achievable. COMPARISON: head CT 04/30/2023 CLINICAL HISTORY: Mental status change, unknown cause FINDINGS: BRAIN AND VENTRICLES:  There is no evidence of an acute infarct, intracranial hemorrhage, mass, midline shift, hydrocephalus, or extra-axial fluid collection. Cerebral volume is normal for age. Cerebral white matter hypodensities are nonspecific but compatible with mild chronic small vessel ischemic disease. Calcified atherosclerosis at the skull base. ORBITS: Bilateral cataract extraction. SINUSES AND MASTOIDS: No acute abnormality. SOFT TISSUES AND SKULL: No acute skull fracture. No acute soft tissue abnormality. IMPRESSION: 1. No acute intracranial abnormality. 2. Mild chronic small vessel ischemic disease. Electronically signed by: Dasie Hamburg MD 04/28/2024 03:12 PM EST RP Workstation: [id number]   CT ABDOMEN PELVIS WO CONTRAST Result Date: 04/28/2024 CLINICAL DATA:  Diffuse abdominal pain.  Sepsis. EXAM: CT ABDOMEN AND PELVIS  WITHOUT CONTRAST TECHNIQUE: Multidetector CT imaging of the abdomen and pelvis was performed following the standard protocol without IV contrast. RADIATION DOSE REDUCTION: This exam was performed according to the departmental dose-optimization program which includes automated exposure control, adjustment of the mA and/or kV according to patient size and/or use of iterative reconstruction technique. COMPARISON:  10/10/2010 FINDINGS: Lower chest: No acute findings. Hepatobiliary: No mass visualized on this unenhanced exam. Prior cholecystectomy. No evidence of biliary obstruction. Pancreas: No mass or inflammatory process visualized on this unenhanced exam. Spleen:  Within normal limits in size. Adrenals/Urinary tract: No evidence of urolithiasis. Mild left renal pyelectasis and ureterectasis is seen with gas in the renal collecting systems, most likely due to vesicoureteral reflux. Small amount of gas also seen in urinary bladder, likely from recent instrumentation. Stomach/Bowel: No evidence of small bowel obstruction, inflammatory process, or abnormal fluid collections. Diffuse gaseous distention of colon is seen with moderate stool burden is, which may be due to ileus or constipation. Vascular/Lymphatic: No pathologically enlarged lymph nodes identified. No evidence of abdominal aortic aneurysm. Reproductive: Prior hysterectomy noted. Adnexal regions are unremarkable in appearance. Other:  None. Musculoskeletal:  No suspicious bone lesions identified. IMPRESSION: Mild left renal pyelectasis and ureterectasis with gas in the renal collecting system, most likely due to vesicoureteral reflux. No evidence of ureteral calculus. Gas in urinary bladder. Recommend clinical correlation for recent instrumentation. Diffuse gaseous distention of colon with moderate stool burden, which may be due to ileus or constipation. Electronically Signed   By: Norleen DELENA Kil M.D.   On: 04/28/2024 11:15   DG Chest Port 1 View Result  Date: 04/28/2024 CLINICAL DATA:  Questionable sepsis.  Tachycardia. EXAM: PORTABLE CHEST 1 VIEW COMPARISON:  04/30/2023 FINDINGS: Rotated film. The cardio pericardial silhouette is enlarged. Atelectasis or infiltrate noted at the right base. Soft tissue fullness noted in the right hilar region with volume loss noted right hemithorax, likely accentuated by rotation. Bones are diffusely demineralized with degenerative changes in each shoulder. IMPRESSION: 1. Atelectasis or infiltrate at the right base. 2. Soft tissue fullness in the right hilar region with volume loss in the right hemithorax, likely accentuated by rotation. Dedicated upright PA and lateral chest x-ray recommended when patient is able. Electronically Signed   By: Camellia Candle M.D.   On: 04/28/2024 08:55    Microbiology: Recent Results (from the past 240 hours)  Urine Culture     Status: Abnormal   Collection Time: 05/14/24  7:00 PM   Specimen: Urine, Clean Catch  Result Value Ref Range Status   Specimen Description   Final    URINE, CLEAN CATCH Performed at [facility], [address] [place], [place] [address]    Special Requests   Final    NONE Performed at [facility], [address] [place],  [place] [address]    Culture MULTIPLE SPECIES PRESENT, SUGGEST RECOLLECTION (A)  Final   Report Status 05/16/2024 FINAL  Final  Culture, blood (Routine X 2) w Reflex to ID Panel     Status: None (Preliminary result)   Collection Time: 05/16/24  8:37 AM   Specimen: BLOOD  Result Value Ref Range Status   Specimen Description BLOOD BLOOD RIGHT HAND  Final   Special Requests   Final    BOTTLES DRAWN AEROBIC AND ANAEROBIC Blood Culture adequate volume   Culture   Final    NO GROWTH 3 DAYS Performed at [facility], [address] [place], [place] [address]    Report Status PENDING  Incomplete  Culture, blood (Routine X 2) w Reflex to ID Panel     Status: None (Preliminary result)   Collection Time:  05/16/24  8:38 AM   Specimen: BLOOD  Result Value Ref Range Status   Specimen Description BLOOD BLOOD LEFT ARM  Final   Special Requests   Final    BOTTLES DRAWN AEROBIC AND ANAEROBIC Blood Culture adequate volume   Culture   Final    NO GROWTH 3 DAYS Performed at [facility], [address], [place], [place] [address]    Report Status PENDING  Incomplete  Urine Culture (for pregnant, neutropenic or urologic patients or patients with an indwelling urinary catheter)     Status: None   Collection Time: 05/16/24  6:45 PM   Specimen: Urine, Clean Catch  Result Value Ref Range Status   Specimen Description   Final    URINE, CLEAN CATCH Performed at [facility], [address] [place], [place] [address]    Special Requests   Final    NONE Performed at [facility], [address], [place], [place] [address]    Culture   Final    NO GROWTH Performed at [facility], [address] [place], [place] [address]    Report Status 05/18/2024 FINAL  Final     Labs: CBC: Recent Labs  Lab 05/14/24 1226 05/16/24 1209 05/17/24 1107 05/18/24 1037 05/19/24 0540  WBC 13.4*  --  10.0 9.5 9.5  NEUTROABS 9.2*  --   --   --   --   HGB 10.2* 9.4* 9.4* 9.2* 9.0*  HCT 32.2* 29.6* 29.9* 29.1* 29.0*  MCV 88.7  --  89.3 88.7 89.8  PLT 445*  --  348 311 334   Basic Metabolic Panel: Recent Labs  Lab 05/14/24 1226 05/15/24 1026 05/17/24 1107 05/18/24 1037 05/19/24 0540  NA 133* 135 134* 134* 136  K 4.9 4.6 4.7 4.6 4.3  CL 99 100 100 100 102  CO2 23 21* 24 24 22   GLUCOSE 100* 75 114* 131* 88  BUN 24* 22 25* 24* 25*  CREATININE 1.61* 1.61* 2.15* 2.14* 2.15*  CALCIUM  8.7* 8.6* 8.6* 8.6* 8.5*  MG  --   --  2.5*  --   --   PHOS  --   --  3.5  --   --    Liver Function Tests: No results for input(s): AST, ALT, ALKPHOS, BILITOT, PROT, ALBUMIN in the last 168 hours. No results for input(s): LIPASE, AMYLASE in the last 168 hours. No  results for input(s): AMMONIA in the last 168 hours. Cardiac Enzymes: No results for input(s): CKTOTAL, CKMB, CKMBINDEX, TROPONINI in the last 168 hours. BNP (last 3 results) No results for input(s): BNP in the last 8760 hours. CBG: Recent  Labs  Lab 05/18/24 1138 05/18/24 1624 05/18/24 2134 05/19/24 0811 05/19/24 1204  GLUCAP 186* 178* 172* 106* 159*    Time spent: 35 minutes  Signed:  Elvan Sor  [facility] 05/19/2024 1:42 PM      [1] No Known Allergies

## 2024-05-19 NOTE — Plan of Care (Signed)
  Problem: Fluid Volume: Goal: Ability to maintain a balanced intake and output will improve Outcome: Progressing   Problem: Health Behavior/Discharge Planning: Goal: Ability to identify and utilize available resources and services will improve Outcome: Progressing   Problem: Nutritional: Goal: Maintenance of adequate nutrition will improve Outcome: Progressing   Problem: Tissue Perfusion: Goal: Adequacy of tissue perfusion will improve Outcome: Progressing

## 2024-05-19 NOTE — Progress Notes (Signed)
 Physical Therapy Treatment Patient Details Name: Cynthia Singh MRN: [id number] DOB: 1940/12/08 Today's Date: 05/19/2024   History of Present Illness Pt is a 84 y.o. female who presents with left flank pain and rectal bleeding, CT angio GI bleed scan, showed likely renal abscess, possible proctitis. Hospitalized 2  weeks ago and found to have subacute vertebral fracture L1, T5, T12, no neurosurgical intervention, TLSO brace when OOB, ok to take off with shower. PMH of CAD, MI, PVD, HTN, HLD, DM, dCHF, GERD, depression, CKD 3B, vertigo, left bundle blockade, chronic back pain with narcotic dependency    PT Comments  Patient received in bed, she is agreeable to PT session. Patient requires min a for bed mobility. Max assist to don Lumbar brace. She is able to stand with min/mod A +2. She ambulated 4 feet with RW to recliner and sat up for ~2 hours. Upon returning to room patient ready to get back in bed. Required mod +2 to stand from recliner and pivot back to bed. Patient will continue to benefit from skilled PT to improve strength and functional independence.     If plan is discharge home, recommend the following: A lot of help with walking and/or transfers;A lot of help with bathing/dressing/bathroom;Assistance with cooking/housework;Help with stairs or ramp for entrance;Assist for transportation   Can travel by private vehicle     No  Equipment Recommendations  Rolling walker (2 wheels)    Recommendations for Other Services       Precautions / Restrictions Precautions Precautions: Fall;Back Recall of Precautions/Restrictions: Impaired Required Braces or Orthoses: Spinal Brace Spinal Brace: Thoracolumbosacral orthotic;Lumbar corset Spinal Brace Comments: patient has TLSO for use when OOB per admission 1 week ago. Patient prefers to wear LSO if any brace at all. Brace adjusted today to fit patient better. (she has both braces in room) Restrictions Weight Bearing Restrictions Per  Provider Order: No     Mobility  Bed Mobility Overal bed mobility: Needs Assistance Bed Mobility: Rolling, Sidelying to Sit, Sit to Sidelying Rolling: Supervision Sidelying to sit: HOB elevated, Used rails, Min assist   Sit to supine: Contact guard assist Sit to sidelying: Contact guard assist      Transfers Overall transfer level: Needs assistance Equipment used: Rolling walker (2 wheels) Transfers: Sit to/from Stand Sit to Stand: Min assist, Mod assist, +2 physical assistance           General transfer comment: mod A to come into standing; cues for hand placement/body mechanics, required mod +2 to stand from recliner    Ambulation/Gait Ambulation/Gait assistance: Min assist Gait Distance (Feet): 4 Feet Assistive device: Rolling walker (2 wheels) Gait Pattern/deviations: Step-through pattern, Decreased step length - right, Decreased step length - left, Decreased stride length, Trunk flexed Gait velocity: decr     General Gait Details: patient able to ambulate forward 4 feet with RW and min A. Very fatigued with this and sat abruptly.   Stairs             Wheelchair Mobility     Tilt Bed    Modified Rankin (Stroke Patients Only)       Balance Overall balance assessment: Needs assistance Sitting-balance support: Feet supported Sitting balance-Leahy Scale: Good     Standing balance support: Bilateral upper extremity supported, During functional activity, Reliant on assistive device for balance Standing balance-Leahy Scale: Poor  Communication Communication Communication: Impaired Factors Affecting Communication: Difficulty expressing self  Cognition Arousal: Alert Behavior During Therapy: WFL for tasks assessed/performed   PT - Cognitive impairments: No apparent impairments                         Following commands: Intact Following commands impaired: Follows one step commands with increased  time    Cueing Cueing Techniques: Verbal cues, Gestural cues  Exercises Other Exercises Other Exercises: B LE strengthing exercises in sitting: LAQ, marching x 10 reps each    General Comments        Pertinent Vitals/Pain Pain Assessment Pain Assessment: Faces Faces Pain Scale: Hurts little more Pain Location: back Pain Descriptors / Indicators: Discomfort Pain Intervention(s): Monitored during session, Repositioned    Home Living                          Prior Function            PT Goals (current goals can now be found in the care plan section) Acute Rehab PT Goals Patient Stated Goal: return to rehab PT Goal Formulation: With patient/family Time For Goal Achievement: 05/30/24 Potential to Achieve Goals: Good Progress towards PT goals: Progressing toward goals    Frequency    Min 2X/week      PT Plan      Co-evaluation PT/OT/SLP Co-Evaluation/Treatment: Yes Reason for Co-Treatment: To address functional/ADL transfers;For patient/therapist safety PT goals addressed during session: Mobility/safety with mobility        AM-PAC PT 6 Clicks Mobility   Outcome Measure  Help needed turning from your back to your side while in a flat bed without using bedrails?: A Little Help needed moving from lying on your back to sitting on the side of a flat bed without using bedrails?: A Little Help needed moving to and from a bed to a chair (including a wheelchair)?: A Lot Help needed standing up from a chair using your arms (e.g., wheelchair or bedside chair)?: A Lot Help needed to walk in hospital room?: A Lot Help needed climbing 3-5 steps with a railing? : Total 6 Click Score: 13    End of Session Equipment Utilized During Treatment: Gait belt Activity Tolerance: Patient limited by fatigue;Patient limited by pain Patient left: in bed;with call bell/phone within reach;with bed alarm set Nurse Communication: Mobility status PT Visit Diagnosis: Other  abnormalities of gait and mobility (R26.89);Muscle weakness (generalized) (M62.81);Difficulty in walking, not elsewhere classified (R26.2);Pain;Unsteadiness on feet (R26.81) Pain - part of body:  (back)     Time: [id number] (+[id number]) PT Time Calculation (min) (ACUTE ONLY): 41 min  Charges:    $Therapeutic Activity: 23-37 mins PT General Charges $$ ACUTE PT VISIT: 1 Visit                     Aailyah Dunbar, PT, GCS 05/19/2024,12:04 PM

## 2024-05-19 NOTE — Progress Notes (Signed)
 Occupational Therapy Treatment Patient Details Name: Cynthia Singh MRN: [id number] DOB: Jun 23, 1940 Today's Date: 05/19/2024   History of present illness Pt is a 84 y.o. female who presents with left flank pain and rectal bleeding, CT angio GI bleed scan, showed likely renal abscess, possible proctitis. Hospitalized 2  weeks ago and found to have subacute vertebral fracture L1, T5, T12, no neurosurgical intervention, TLSO brace when OOB, ok to take off with shower. PMH of CAD, MI, PVD, HTN, HLD, DM, dCHF, GERD, depression, CKD 3B, vertigo, left bundle blockade, chronic back pain with narcotic dependency   OT comments  Patient seen for OT treatment on this date. Upon arrival to room patient resting in bed, agreeable to treatment. Patient transitioned to EOB with min A; refused to don brace. Patient performed sit<>stand with mod A from EOB and with r/w, ambulated ~5 feet with r/w to recliner. Patient set up in recliner at sink to perform grooming tasks with set up A.  Patient ended treatment in recliner with bed/chair alarm on and all needs within reach. Patient making good progress toward goals, will continue to follow POC. Discharge recommendation remains appropriate.        If plan is discharge home, recommend the following:  A lot of help with walking and/or transfers;A lot of help with bathing/dressing/bathroom   Equipment Recommendations  Other (comment)    Recommendations for Other Services      Precautions / Restrictions Precautions Precautions: Fall;Back Recall of Precautions/Restrictions: Impaired Required Braces or Orthoses: Spinal Brace Spinal Brace: Lumbar corset;Thoracolumbosacral orthotic Spinal Brace Comments: patient has TLSO for use when OOB per admission 1 week ago. Restrictions Weight Bearing Restrictions Per Provider Order: No       Mobility Bed Mobility Overal bed mobility: Needs Assistance Bed Mobility: Rolling, Sidelying to Sit, Sit to Sidelying Rolling:  Supervision Sidelying to sit: Min assist, HOB elevated, Used rails     Sit to sidelying: Min assist      Transfers Overall transfer level: Needs assistance Equipment used: Rolling walker (2 wheels) Transfers: Sit to/from Stand Sit to Stand: Min assist, +2 physical assistance           General transfer comment: mod A to come into standing; cues for hand placement/body mechanics     Balance Overall balance assessment: Needs assistance Sitting-balance support: Feet supported Sitting balance-Leahy Scale: Fair Sitting balance - Comments: no LOB seated at EOB   Standing balance support: Bilateral upper extremity supported, During functional activity, Reliant on assistive device for balance Standing balance-Leahy Scale: Poor                             ADL either performed or assessed with clinical judgement   ADL Overall ADL's : Needs assistance/impaired     Grooming: Wash/dry face;Applying deodorant;Brushing hair;Sitting;Set up                               Functional mobility during ADLs: Rolling walker (2 wheels);Moderate assistance General ADL Comments: set up in recliner sitting at sink to perform grooming tasks    Extremity/Trunk Assessment Upper Extremity Assessment Upper Extremity Assessment: Generalized weakness   Lower Extremity Assessment Lower Extremity Assessment: Defer to PT evaluation        Vision       Perception     Praxis     Communication Communication Communication: Impaired Factors Affecting Communication: Difficulty expressing  self   Cognition Arousal: Alert Behavior During Therapy: WFL for tasks assessed/performed Cognition: No family/caregiver present to determine baseline                               Following commands: Intact Following commands impaired: Follows one step commands with increased time      Cueing   Cueing Techniques: Verbal cues, Gestural cues  Exercises      Shoulder  Instructions       General Comments      Pertinent Vitals/ Pain       Pain Assessment Pain Assessment: 0-10 Pain Score: 6  Pain Location: back Pain Descriptors / Indicators: Discomfort, Sore Pain Intervention(s): Monitored during session  Home Living                                          Prior Functioning/Environment              Frequency  Min 2X/week        Progress Toward Goals  OT Goals(current goals can now be found in the care plan section)  Progress towards OT goals: Progressing toward goals  Acute Rehab OT Goals Patient Stated Goal: to go home OT Goal Formulation: With patient Time For Goal Achievement: 05/30/24 Potential to Achieve Goals: Fair ADL Goals Pt Will Perform Grooming: sitting;with set-up Pt Will Perform Lower Body Dressing: with min assist;sitting/lateral leans;sit to/from stand;with contact guard assist Pt Will Transfer to Toilet: bedside commode;ambulating;with min assist;with contact guard assist Pt/caregiver will Perform Home Exercise Program: Increased strength;With theraband;Both right and left upper extremity;With written HEP provided  Plan      Co-evaluation    PT/OT/SLP Co-Evaluation/Treatment: Yes Reason for Co-Treatment: To address functional/ADL transfers;For patient/therapist safety PT goals addressed during session: Mobility/safety with mobility        AM-PAC OT 6 Clicks Daily Activity     Outcome Measure   Help from another person eating meals?: A Little Help from another person taking care of personal grooming?: A Little Help from another person toileting, which includes using toliet, bedpan, or urinal?: A Lot Help from another person bathing (including washing, rinsing, drying)?: A Lot Help from another person to put on and taking off regular upper body clothing?: A Little Help from another person to put on and taking off regular lower body clothing?: A Lot 6 Click Score: 15    End of  Session Equipment Utilized During Treatment: Rolling walker (2 wheels)  OT Visit Diagnosis: Unsteadiness on feet (R26.81);Other abnormalities of gait and mobility (R26.89);Muscle weakness (generalized) (M62.81);History of falling (Z91.81)   Activity Tolerance Patient limited by pain   Patient Left in chair;with call bell/phone within reach;with chair alarm set   Nurse Communication Mobility status        Time: 1000-1024 OT Time Calculation (min): 24 min  Charges: OT General Charges $OT Visit: 1 Visit OT Treatments $Self Care/Home Management : 8-22 mins  Rogers Clause, OT/L MSOT, 05/19/2024

## 2024-05-21 LAB — CULTURE, BLOOD (ROUTINE X 2)
Culture: NO GROWTH
Culture: NO GROWTH
Special Requests: ADEQUATE
Special Requests: ADEQUATE

## 2024-05-25 ENCOUNTER — Other Ambulatory Visit: Payer: Self-pay

## 2024-05-25 DIAGNOSIS — S32019D Unspecified fracture of first lumbar vertebra, subsequent encounter for fracture with routine healing: Secondary | ICD-10-CM

## 2024-05-25 DIAGNOSIS — S22009A Unspecified fracture of unspecified thoracic vertebra, initial encounter for closed fracture: Secondary | ICD-10-CM

## 2024-05-25 NOTE — Progress Notes (Unsigned)
 "  Referring Physician:  Wendee Lynwood HERO, NP [address] [place],  [place] [address]  Primary Physician:  Wendee Lynwood HERO, NP  History of Present Illness: 05/25/2024 Ms. Lexianna Weinrich is here today with a chief complaint of ***  Hosp F/U  Lumber and thoracic fracture Pain? Numbness/ tingling/weakness? Is she wearing brace?  Duration: *** Location: *** Quality: *** Severity: ***  Precipitating: aggravated by *** Modifying factors: made better by *** Weakness: none Timing: *** Bowel/Bladder Dysfunction: none  Conservative measures:  Physical therapy: *** Has not participated in? Multimodal medical therapy including regular antiinflammatories: *** oxycodone  Injections: *** epidural steroid injections?  Past Surgery: ***  LEILANNI HALVORSON has ***no symptoms of cervical myelopathy.  The symptoms are causing a significant impact on the patient's life.   Review of Systems:  A 10 point review of systems is negative, except for the pertinent positives and negatives detailed in the HPI.  Past Medical History: Past Medical History:  Diagnosis Date   CAD (coronary artery disease)    a. Remote nonobstructive disease but in 10/2012 Cath/PCI: s/p DES to RCA. b. cath 02/08/15 DES to prox LAD and balloon angioplasty of ost D1, EF 55-65%; c. 08/2019 PCI/DES prox/distal RCA (overlapping prev RCA stent). Prev placed RCA/LAD stents patent.   Concussion    SAH AFTER FALL 3/18   Depression    Diabetes mellitus, type 2 (HCC)    Dyslipidemia    Episodic mood disorder    HTN (hypertension)    Hypertension    Myocardial infarction (HCC)    2014   Obesity    Osteoarthritis of spine    knees also   Peripheral vascular disease, unspecified 2020   severe disease on home screening by insurance    Past Surgical History: Past Surgical History:  Procedure Laterality Date   ABDOMINAL HYSTERECTOMY     APPENDECTOMY  1959   CARDIAC CATHETERIZATION N/A 02/08/2015   Procedure: Left Heart  Cath and Coronary Angiography;  Surgeon: Ozell Fell, MD;  Location: [facility] INVASIVE CV LAB;  Service: Cardiovascular;  Laterality: N/A;   CARDIAC CATHETERIZATION N/A 02/08/2015   Procedure: Coronary Stent Intervention;  Surgeon: Ozell Fell, MD;  [facility] INVASIVE CV LAB;  Service: Cardiovascular;  Laterality: N/A;   CARDIAC CATHETERIZATION N/A 02/08/2015   Procedure: Intravascular Pressure Wire/FFR Study;  Surgeon: Ozell Fell, MD;  Location: [facility] INVASIVE CV LAB;  Service: Cardiovascular;  Laterality: N/A;   CATARACT EXTRACTION W/PHACO Left 11/10/2016   Procedure: CATARACT EXTRACTION PHACO AND INTRAOCULAR LENS PLACEMENT (IOC) suture placed in left eye at end of procedure;  Surgeon: Jaye Fallow, MD;  Location: [facility] ORS;  Service: Ophthalmology;  Laterality: Left;  US   3.20 AP% 27.7 CDE 55.63 Fluid pack lot # [id number]   CHOLECYSTECTOMY     COMBINED HYSTERECTOMY ABDOMINAL W/ A&P REPAIR / OOPHORECTOMY  1968   CORONARY ANGIOPLASTY     STENTS X 5   CORONARY STENT INTERVENTION N/A 09/19/2019   Procedure: CORONARY STENT INTERVENTION;  Surgeon: Mady Bruckner, MD;  Location: [facility] INVASIVE CV LAB;  Service: Cardiovascular;  Laterality: N/A;   CORONARY STENT PLACEMENT  2014   DILATION AND CURETTAGE OF UTERUS     FLEXIBLE SIGMOIDOSCOPY N/A 05/16/2024   Procedure: KINGSTON SIDE;  Surgeon: Onita Elspeth Ozell, DO;  Location: [facility] ENDOSCOPY;  Service: Gastroenterology;  Laterality: N/A;   GALLBLADDER SURGERY  1968   LEFT HEART CATH AND CORONARY ANGIOGRAPHY Left 09/19/2019   Procedure: LEFT HEART CATH AND CORONARY ANGIOGRAPHY;  Surgeon:  End, Lonni, MD;  Location: [facility] INVASIVE CV LAB;  Service: Cardiovascular;  Laterality: Left;   LEFT HEART CATHETERIZATION WITH CORONARY ANGIOGRAM N/A 10/20/2012   Procedure: LEFT HEART CATHETERIZATION WITH CORONARY ANGIOGRAM;  Surgeon: Ozell Fell, MD;  Location: [facility] CATH LAB;  Service: Cardiovascular;  Laterality: N/A;   multiple D&C      RECTAL BIOPSY  05/16/2024   Procedure: BIOPSY, RECTUM;  Surgeon: Onita Elspeth Ozell, DO;  Location: [facility] ENDOSCOPY;  Service: Gastroenterology;;   TONSILLECTOMY  age 51    Allergies: Allergies as of 05/29/2024   (No Known Allergies)    Medications: Outpatient Encounter Medications as of 05/29/2024  Medication Sig   amLODipine  (NORVASC ) 5 MG tablet Take 1 tablet (5 mg total) by mouth daily. Hold off if SBP <130 mmHg   amoxicillin -clavulanate (AUGMENTIN ) 500-125 MG tablet Take 1 tablet by mouth in the morning and at bedtime for 10 days.   aspirin  EC 81 MG tablet Take 81 mg by mouth daily.   atorvastatin  (LIPITOR ) 40 MG tablet TAKE 1 TABLET BY MOUTH EVERY DAY   bisacodyl  (DULCOLAX) 10 MG suppository Place 1 suppository (10 mg total) rectally daily as needed for severe constipation.   bisacodyl  (DULCOLAX) 5 MG EC tablet Take 2 tablets (10 mg total) by mouth at bedtime.   clopidogrel  (PLAVIX ) 75 MG tablet TAKE 1 TABLET BY MOUTH EVERY DAY   DULoxetine  (CYMBALTA ) 30 MG capsule TAKE 1 CAPSULE BY MOUTH EVERY DAY   empagliflozin  (JARDIANCE ) 25 MG TABS tablet Take 1 tablet (25 mg total) by mouth daily before breakfast.   hydrALAZINE  (APRESOLINE ) 50 MG tablet Take 1 tablet (50 mg total) by mouth every 6 (six) hours as needed (SBP >150).   insulin  glargine (LANTUS  SOLOSTAR) 100 UNIT/ML Solostar Pen Inject 40 Units into the skin daily. TAKE 40 UNITS DAILY UNDER SKIN   Insulin  Pen Needle (PEN NEEDLES) 32G X 5 MM MISC 1 Units by Does not apply route daily.   meclizine  (ANTIVERT ) 12.5 MG tablet Take 1-2 tablets (12.5-25 mg total) by mouth 3 (three) times daily as needed for dizziness. (Patient taking differently: Take 12.5 mg by mouth 3 (three) times daily as needed for dizziness.)   metFORMIN  (GLUCOPHAGE -XR) 500 MG 24 hr tablet TAKE 2 TABLETS (1,000 MG TOTAL) BY MOUTH IN THE MORNING AND AT BEDTIME.   nitroGLYCERIN  (NITROSTAT ) 0.4 MG SL tablet Place 1 tablet (0.4 mg total) under the tongue every 5 (five)  minutes as needed for chest pain.   nortriptyline  (PAMELOR ) 50 MG capsule Take 3 capsules (150 mg total) by mouth at bedtime.   oxyCODONE -acetaminophen  (PERCOCET) 10-325 MG tablet Take 1 tablet by mouth every 6 (six) hours as needed for pain.   pantoprazole  (PROTONIX ) 40 MG tablet Take 1 tablet (40 mg total) by mouth daily.   polyethylene glycol (MIRALAX  / GLYCOLAX ) 17 g packet Take 17 g by mouth 2 (two) times daily.   No facility-administered encounter medications on file as of 05/29/2024.    Social History: Social History[1]  Family Medical History: Family History  Adopted: Yes  Problem Relation Age of Onset   Cancer Brother        Colon   Cancer Son    Coronary artery disease Neg Hx    Heart attack Neg Hx     Physical Examination: @VITALWITHPAIN @  General: Patient is well developed, well nourished, calm, collected, and in no apparent distress. Attention to examination is appropriate.  Psychiatric: Patient is non-anxious.  Head:  Pupils equal, round, and reactive to  light.  ENT:  Oral mucosa appears well hydrated.  Neck:   Supple.  ***Full range of motion.  Respiratory: Patient is breathing without any difficulty.  Extremities: No edema.  Vascular: Palpable dorsal pedal pulses.  Skin:   On exposed skin, there are no abnormal skin lesions.  NEUROLOGICAL:     Awake, alert, oriented to person, place, and time.  Speech is clear and fluent. Fund of knowledge is appropriate.   Cranial Nerves: Pupils equal round and reactive to light.  Facial tone is symmetric.  Facial sensation is symmetric.  ROM of spine: ***full.  Palpation of spine: ***non tender.    Strength: Side Biceps Triceps Deltoid Interossei Grip Wrist Ext. Wrist Flex.  R 5 5 5 5 5 5 5   L 5 5 5 5 5 5 5    Side Iliopsoas Quads Hamstring PF DF EHL  R 5 5 5 5 5 5   L 5 5 5 5 5 5    Reflexes are ***2+ and symmetric at the biceps, triceps, brachioradialis, patella and achilles.   Hoffman's is absent.  Clonus  is not present.  Toes are down-going.  Bilateral upper and lower extremity sensation is intact to light touch.    Gait is normal.   No difficulty with tandem gait.   No evidence of dysmetria noted.  Medical Decision Making  Imaging: ***  I have personally reviewed the images and agree with the above interpretation.  Assessment and Plan: Ms. Holten is a pleasant 84 y.o. female with ***    Thank you for involving me in the care of this patient.   I spent a total of *** minutes in both face-to-face and non-face-to-face activities for this visit on the date of this encounter.   Lyle Decamp, PA-C Dept. of Neurosurgery     [1]  Social History Tobacco Use   Smoking status: Never    Passive exposure: Past   Smokeless tobacco: Never  Vaping Use   Vaping status: Never Used  Substance Use Topics   Alcohol use: No   Drug use: No   "

## 2024-05-29 ENCOUNTER — Other Ambulatory Visit

## 2024-05-29 ENCOUNTER — Ambulatory Visit: Admitting: Physician Assistant

## 2024-06-23 ENCOUNTER — Ambulatory Visit: Payer: Self-pay

## 2024-06-23 NOTE — Telephone Encounter (Signed)
 Agree with evaluation

## 2024-06-23 NOTE — Telephone Encounter (Signed)
 FYI Only or Action Required?: Action required by provider: refused ED.  Patient was last seen in primary care on 02/16/2024 by Wendee Lynwood HERO, NP.  Called Nurse Triage reporting Abdominal Pain and Constipation.  Symptoms began several days ago.  Interventions attempted: OTC medications: stool softener.  Symptoms are: unchanged.  Triage Disposition: Go to ED Now (or PCP Triage)  Patient/caregiver understands and will follow disposition?: No, wishes to speak with PCP                                  Onset: within past few days Symptoms: lower abdominal pain (rates 7-8), diminished bowel sounds (per [facility] nurse), no BM in 5 days Denies: blood in stool, abdominal distention/swelling, NV, fever History of going long periods of time without a BM  Randine, a home health nurse with [facility] called in. This RN spoke to patient directly. This RN advised ED, given moderate to severe abdominal pain and 5 days without a BM. Patient declined because she was recently in the hospital and does not want to go back. Patient is seeking provider's recommendation or OV. Please advise. Patient would appreciate a call back.   Reason for Disposition  Patient sounds very sick or weak to the triager  Protocols used: Abdominal Pain - Female-A-AH   Reason for Triage: Dwayne reinhold Joseph  Doing a nursing care evaulation- pt still having significant pain 7/8 and has not had a bowel movement in 5 days, not hearing many bowel sounds/diminished.

## 2024-06-23 NOTE — Telephone Encounter (Signed)
 Patient needs to be evaluated one way or another.  Can we triage and give recommendations please

## 2024-06-23 NOTE — Telephone Encounter (Signed)
 This RN called [facility] to notify of ED refusal.

## 2024-06-23 NOTE — Telephone Encounter (Signed)
 I spoke with pt who has had lower rt sided sharp abd pain on and off for several days.(Pt not more specific with time) Now pain level is 7 - 8 pt having constipation and last normal BM was 06/19/24 . No fever, no N&V and no blood seen. I advised pt if she was having abd pain pain level of 7 - 8 she should be evaluated today. Pt said she cannot be seen today because everyone works 2nd shift. Pt said she would go to UC on 06/24/24 and if her condition changes or worsens before tomorrow pt will call 911 and go to ED. Pt said she is not in distress. Sending note to CHRISTELLA Crandall NP, [employer] and will teams Byron CMA.
